# Patient Record
Sex: Female | Born: 1940
Health system: Southern US, Community
[De-identification: ages and names within clinical notes are randomized; demographics above are authoritative.]

## PROBLEM LIST (undated history)

## (undated) DIAGNOSIS — I350 Nonrheumatic aortic (valve) stenosis: Secondary | ICD-10-CM

## (undated) DIAGNOSIS — I209 Angina pectoris, unspecified: Secondary | ICD-10-CM

## (undated) DIAGNOSIS — I499 Cardiac arrhythmia, unspecified: Secondary | ICD-10-CM

## (undated) DIAGNOSIS — N8110 Cystocele, unspecified: Secondary | ICD-10-CM

## (undated) DIAGNOSIS — Z5189 Encounter for other specified aftercare: Secondary | ICD-10-CM

## (undated) DIAGNOSIS — E079 Disorder of thyroid, unspecified: Secondary | ICD-10-CM

## (undated) DIAGNOSIS — R0600 Dyspnea, unspecified: Secondary | ICD-10-CM

## (undated) DIAGNOSIS — M1990 Unspecified osteoarthritis, unspecified site: Secondary | ICD-10-CM

## (undated) DIAGNOSIS — I519 Heart disease, unspecified: Secondary | ICD-10-CM

## (undated) DIAGNOSIS — I10 Essential (primary) hypertension: Secondary | ICD-10-CM

## (undated) DIAGNOSIS — E785 Hyperlipidemia, unspecified: Secondary | ICD-10-CM

## (undated) DIAGNOSIS — H409 Unspecified glaucoma: Secondary | ICD-10-CM

## (undated) DIAGNOSIS — R011 Cardiac murmur, unspecified: Secondary | ICD-10-CM

## (undated) DIAGNOSIS — K219 Gastro-esophageal reflux disease without esophagitis: Secondary | ICD-10-CM

## (undated) HISTORY — DX: Hyperlipidemia, unspecified: E78.5

## (undated) HISTORY — DX: Nonrheumatic aortic (valve) stenosis: I35.0

## (undated) HISTORY — DX: Heart disease, unspecified: I51.9

## (undated) HISTORY — DX: Unspecified osteoarthritis, unspecified site: M19.90

## (undated) HISTORY — PX: COLONOSCOPY W/ POLYPECTOMY: SHX1380

## (undated) HISTORY — DX: Disorder of thyroid, unspecified: E07.9

## (undated) HISTORY — PX: CATARACT EXTRACTION: SUR2

## (undated) HISTORY — PX: DILATION AND CURETTAGE OF UTERUS: SHX78

## (undated) HISTORY — PX: ABDOMINAL HYSTERECTOMY: SHX81

## (undated) HISTORY — PX: COLONOSCOPY: SHX174

## (undated) HISTORY — PX: OVARY SURGERY: SHX727

## (undated) HISTORY — DX: Essential (primary) hypertension: I10

## (undated) HISTORY — DX: Gastro-esophageal reflux disease without esophagitis: K21.9

## (undated) HISTORY — DX: Unspecified glaucoma: H40.9

## (undated) SURGERY — ESOPHAGOGASTRODUODENOSCOPY (EGD) WITH PROPOFOL
Anesthesia: Monitor Anesthesia Care

---

## 1995-05-17 HISTORY — PX: LYMPH NODE DISSECTION: SHX5087

## 2007-05-17 HISTORY — PX: CHOLECYSTECTOMY: SHX55

## 2008-02-14 HISTORY — PX: THYROIDECTOMY: SHX17

## 2008-05-16 HISTORY — PX: NM MYOCAR PERF WALL MOTION: HXRAD629

## 2008-06-26 ENCOUNTER — Encounter: Payer: Self-pay | Admitting: Family Medicine

## 2008-08-06 ENCOUNTER — Ambulatory Visit: Payer: Self-pay | Admitting: Family Medicine

## 2008-08-06 DIAGNOSIS — Z794 Long term (current) use of insulin: Secondary | ICD-10-CM | POA: Insufficient documentation

## 2008-08-06 DIAGNOSIS — E785 Hyperlipidemia, unspecified: Secondary | ICD-10-CM | POA: Insufficient documentation

## 2008-08-06 DIAGNOSIS — E1169 Type 2 diabetes mellitus with other specified complication: Secondary | ICD-10-CM | POA: Insufficient documentation

## 2008-08-06 DIAGNOSIS — E663 Overweight: Secondary | ICD-10-CM | POA: Insufficient documentation

## 2008-08-06 DIAGNOSIS — N181 Chronic kidney disease, stage 1: Secondary | ICD-10-CM | POA: Insufficient documentation

## 2008-08-06 DIAGNOSIS — I10 Essential (primary) hypertension: Secondary | ICD-10-CM | POA: Insufficient documentation

## 2008-08-06 DIAGNOSIS — E782 Mixed hyperlipidemia: Secondary | ICD-10-CM | POA: Insufficient documentation

## 2008-08-06 DIAGNOSIS — E1122 Type 2 diabetes mellitus with diabetic chronic kidney disease: Secondary | ICD-10-CM | POA: Insufficient documentation

## 2008-08-06 LAB — CONVERTED CEMR LAB
Glucose, Bld: 103 mg/dL
Hgb A1c MFr Bld: 7.8 %

## 2008-08-14 ENCOUNTER — Encounter: Payer: Self-pay | Admitting: Family Medicine

## 2008-08-17 ENCOUNTER — Encounter: Payer: Self-pay | Admitting: Family Medicine

## 2008-08-18 LAB — CONVERTED CEMR LAB
ALT: 38 units/L — ABNORMAL HIGH (ref 0–35)
AST: 36 units/L (ref 0–37)
Albumin: 4.2 g/dL (ref 3.5–5.2)
Alkaline Phosphatase: 58 units/L (ref 39–117)
BUN: 11 mg/dL (ref 6–23)
Basophils Absolute: 0 10*3/uL (ref 0.0–0.1)
Basophils Relative: 1 % (ref 0–1)
Bilirubin, Direct: 0.1 mg/dL (ref 0.0–0.3)
CO2: 27 meq/L (ref 19–32)
Calcium: 9.6 mg/dL (ref 8.4–10.5)
Chloride: 102 meq/L (ref 96–112)
Cholesterol: 197 mg/dL (ref 0–200)
Creatinine, Ser: 0.78 mg/dL (ref 0.40–1.20)
Eosinophils Absolute: 0.2 10*3/uL (ref 0.0–0.7)
Eosinophils Relative: 3 % (ref 0–5)
Glucose, Bld: 133 mg/dL — ABNORMAL HIGH (ref 70–99)
HCT: 40.3 % (ref 36.0–46.0)
HDL: 41 mg/dL (ref >39)
Hemoglobin: 13.5 g/dL (ref 12.0–15.0)
Indirect Bilirubin: 0.4 mg/dL (ref 0.0–0.9)
LDL Cholesterol: 132 mg/dL — ABNORMAL HIGH (ref 0–99)
Lymphocytes Relative: 40 % (ref 12–46)
Lymphs Abs: 2.2 10*3/uL (ref 0.7–4.0)
MCHC: 33.5 g/dL (ref 30.0–36.0)
MCV: 82.8 fL (ref 78.0–100.0)
Monocytes Absolute: 0.4 10*3/uL (ref 0.1–1.0)
Monocytes Relative: 7 % (ref 3–12)
Neutro Abs: 2.7 10*3/uL (ref 1.7–7.7)
Neutrophils Relative %: 49 % (ref 43–77)
Platelets: 213 10*3/uL (ref 150–400)
Potassium: 3.9 meq/L (ref 3.5–5.3)
RBC: 4.87 M/uL (ref 3.87–5.11)
RDW: 13.9 % (ref 11.5–15.5)
Sodium: 142 meq/L (ref 135–145)
TSH: 2.368 microintl units/mL (ref 0.350–4.500)
Total Bilirubin: 0.5 mg/dL (ref 0.3–1.2)
Total CHOL/HDL Ratio: 4.8
Total Protein: 7.7 g/dL (ref 6.0–8.3)
Triglycerides: 120 mg/dL (ref <150)
VLDL: 24 mg/dL (ref 0–40)
WBC: 5.5 10*3/uL (ref 4.0–10.5)

## 2008-08-28 ENCOUNTER — Encounter: Payer: Self-pay | Admitting: Family Medicine

## 2008-09-03 ENCOUNTER — Encounter: Payer: Self-pay | Admitting: Family Medicine

## 2008-09-05 ENCOUNTER — Ambulatory Visit (HOSPITAL_COMMUNITY): Admission: RE | Admit: 2008-09-05 | Discharge: 2008-09-05 | Payer: Self-pay | Admitting: Cardiology

## 2008-09-05 ENCOUNTER — Encounter: Payer: Self-pay | Admitting: Family Medicine

## 2008-09-10 ENCOUNTER — Ambulatory Visit (HOSPITAL_COMMUNITY): Admission: RE | Admit: 2008-09-10 | Discharge: 2008-09-10 | Payer: Self-pay | Admitting: Cardiology

## 2008-09-10 HISTORY — PX: LEFT HEART CATH: SHX5946

## 2008-09-17 ENCOUNTER — Ambulatory Visit: Payer: Self-pay | Admitting: Family Medicine

## 2008-09-17 DIAGNOSIS — R109 Unspecified abdominal pain: Secondary | ICD-10-CM | POA: Insufficient documentation

## 2008-09-17 LAB — CONVERTED CEMR LAB: Glucose, Bld: 113 mg/dL

## 2008-09-22 ENCOUNTER — Encounter (INDEPENDENT_AMBULATORY_CARE_PROVIDER_SITE_OTHER): Payer: Self-pay | Admitting: *Deleted

## 2008-09-22 LAB — CONVERTED CEMR LAB: Helicobacter Pylori Antibody-IgG: 0.4

## 2008-09-26 ENCOUNTER — Ambulatory Visit (HOSPITAL_COMMUNITY): Admission: RE | Admit: 2008-09-26 | Discharge: 2008-09-26 | Payer: Self-pay | Admitting: Family Medicine

## 2008-09-29 ENCOUNTER — Encounter: Payer: Self-pay | Admitting: Family Medicine

## 2008-10-01 ENCOUNTER — Encounter (HOSPITAL_COMMUNITY): Admission: RE | Admit: 2008-10-01 | Discharge: 2008-10-31 | Payer: Self-pay | Admitting: Family Medicine

## 2008-10-22 ENCOUNTER — Ambulatory Visit: Payer: Self-pay | Admitting: Family Medicine

## 2008-10-22 ENCOUNTER — Encounter: Payer: Self-pay | Admitting: Family Medicine

## 2008-10-22 ENCOUNTER — Other Ambulatory Visit: Admission: RE | Admit: 2008-10-22 | Discharge: 2008-10-22 | Payer: Self-pay | Admitting: Family Medicine

## 2008-10-22 LAB — CONVERTED CEMR LAB
Creatinine, Urine: 83.9 mg/dL
Glucose, Bld: 123 mg/dL
Microalb Creat Ratio: 15.4 mg/g (ref 0.0–30.0)
Microalb, Ur: 1.29 mg/dL (ref 0.00–1.89)
OCCULT 1: NEGATIVE

## 2008-11-06 ENCOUNTER — Encounter: Payer: Self-pay | Admitting: Family Medicine

## 2008-11-13 ENCOUNTER — Ambulatory Visit: Payer: Self-pay | Admitting: Gastroenterology

## 2008-11-13 DIAGNOSIS — R198 Other specified symptoms and signs involving the digestive system and abdomen: Secondary | ICD-10-CM | POA: Insufficient documentation

## 2008-11-18 DIAGNOSIS — K219 Gastro-esophageal reflux disease without esophagitis: Secondary | ICD-10-CM

## 2008-11-18 HISTORY — DX: Gastro-esophageal reflux disease without esophagitis: K21.9

## 2008-12-03 ENCOUNTER — Ambulatory Visit: Payer: Self-pay | Admitting: Family Medicine

## 2008-12-03 LAB — CONVERTED CEMR LAB
Glucose, Bld: 165 mg/dL
Hgb A1c MFr Bld: 6.8 %

## 2008-12-04 ENCOUNTER — Encounter: Payer: Self-pay | Admitting: Family Medicine

## 2008-12-05 ENCOUNTER — Ambulatory Visit: Payer: Self-pay | Admitting: Gastroenterology

## 2008-12-05 ENCOUNTER — Ambulatory Visit (HOSPITAL_COMMUNITY): Admission: RE | Admit: 2008-12-05 | Discharge: 2008-12-05 | Payer: Self-pay | Admitting: Gastroenterology

## 2008-12-05 ENCOUNTER — Encounter: Payer: Self-pay | Admitting: Gastroenterology

## 2008-12-25 ENCOUNTER — Telehealth: Payer: Self-pay | Admitting: Family Medicine

## 2009-01-12 ENCOUNTER — Telehealth: Payer: Self-pay | Admitting: Family Medicine

## 2009-01-29 ENCOUNTER — Encounter: Payer: Self-pay | Admitting: Family Medicine

## 2009-02-16 LAB — CONVERTED CEMR LAB
ALT: 32 units/L (ref 0–35)
AST: 31 units/L (ref 0–37)
Albumin: 4.2 g/dL (ref 3.5–5.2)
Alkaline Phosphatase: 54 units/L (ref 39–117)
BUN: 10 mg/dL (ref 6–23)
Bilirubin, Direct: 0.1 mg/dL (ref 0.0–0.3)
CO2: 27 meq/L (ref 19–32)
Calcium: 9.5 mg/dL (ref 8.4–10.5)
Chloride: 103 meq/L (ref 96–112)
Cholesterol: 147 mg/dL (ref 0–200)
Creatinine, Ser: 0.68 mg/dL (ref 0.40–1.20)
Glucose, Bld: 131 mg/dL — ABNORMAL HIGH (ref 70–99)
HDL: 37 mg/dL — ABNORMAL LOW (ref >39)
Indirect Bilirubin: 0.3 mg/dL (ref 0.0–0.9)
LDL Cholesterol: 77 mg/dL (ref 0–99)
Potassium: 3.6 meq/L (ref 3.5–5.3)
Sodium: 144 meq/L (ref 135–145)
Total Bilirubin: 0.4 mg/dL (ref 0.3–1.2)
Total CHOL/HDL Ratio: 4
Total Protein: 7.3 g/dL (ref 6.0–8.3)
Triglycerides: 163 mg/dL — ABNORMAL HIGH (ref <150)
VLDL: 33 mg/dL (ref 0–40)

## 2009-02-27 ENCOUNTER — Encounter: Payer: Self-pay | Admitting: Family Medicine

## 2009-03-05 ENCOUNTER — Telehealth: Payer: Self-pay | Admitting: Family Medicine

## 2009-03-05 ENCOUNTER — Ambulatory Visit: Payer: Self-pay | Admitting: Family Medicine

## 2009-03-05 LAB — CONVERTED CEMR LAB: Hgb A1c MFr Bld: 7 %

## 2009-03-19 ENCOUNTER — Telehealth: Payer: Self-pay | Admitting: Family Medicine

## 2009-04-08 ENCOUNTER — Ambulatory Visit: Payer: Self-pay | Admitting: Family Medicine

## 2009-04-22 ENCOUNTER — Ambulatory Visit: Payer: Self-pay | Admitting: Family Medicine

## 2009-04-22 DIAGNOSIS — M542 Cervicalgia: Secondary | ICD-10-CM | POA: Insufficient documentation

## 2009-04-22 LAB — CONVERTED CEMR LAB: Glucose, Bld: 95 mg/dL

## 2009-05-06 ENCOUNTER — Ambulatory Visit: Payer: Self-pay | Admitting: Family Medicine

## 2009-05-28 ENCOUNTER — Ambulatory Visit: Payer: Self-pay | Admitting: Family Medicine

## 2009-05-28 LAB — CONVERTED CEMR LAB: Glucose, Bld: 140 mg/dL

## 2009-05-31 DIAGNOSIS — K828 Other specified diseases of gallbladder: Secondary | ICD-10-CM | POA: Insufficient documentation

## 2009-06-03 ENCOUNTER — Telehealth: Payer: Self-pay | Admitting: Family Medicine

## 2009-06-09 ENCOUNTER — Ambulatory Visit (HOSPITAL_COMMUNITY): Admission: RE | Admit: 2009-06-09 | Discharge: 2009-06-09 | Payer: Self-pay | Admitting: General Surgery

## 2009-07-13 ENCOUNTER — Ambulatory Visit (HOSPITAL_COMMUNITY): Admission: RE | Admit: 2009-07-13 | Discharge: 2009-07-13 | Payer: Self-pay | Admitting: Pediatrics

## 2009-08-06 ENCOUNTER — Ambulatory Visit: Payer: Self-pay | Admitting: Family Medicine

## 2009-08-06 DIAGNOSIS — R634 Abnormal weight loss: Secondary | ICD-10-CM | POA: Insufficient documentation

## 2009-08-06 DIAGNOSIS — R5383 Other fatigue: Secondary | ICD-10-CM

## 2009-08-06 DIAGNOSIS — R5381 Other malaise: Secondary | ICD-10-CM | POA: Insufficient documentation

## 2009-08-21 ENCOUNTER — Ambulatory Visit: Payer: Self-pay | Admitting: Gastroenterology

## 2009-08-24 DIAGNOSIS — K589 Irritable bowel syndrome without diarrhea: Secondary | ICD-10-CM | POA: Insufficient documentation

## 2009-09-16 LAB — CONVERTED CEMR LAB
Basophils Absolute: 0 10*3/uL (ref 0.0–0.1)
Basophils Relative: 0 % (ref 0–1)
Eosinophils Absolute: 0.2 10*3/uL (ref 0.0–0.7)
Eosinophils Relative: 4 % (ref 0–5)
HCT: 38.7 % (ref 36.0–46.0)
Hemoglobin: 12.9 g/dL (ref 12.0–15.0)
Hgb A1c MFr Bld: 6.5 % — ABNORMAL HIGH (ref <5.7)
Hgb A1c MFr Bld: 6.8 % — ABNORMAL HIGH (ref 4.6–6.1)
Lymphocytes Relative: 40 % (ref 12–46)
Lymphs Abs: 2.5 10*3/uL (ref 0.7–4.0)
MCHC: 33.3 g/dL (ref 30.0–36.0)
MCV: 84.3 fL (ref 78.0–100.0)
Monocytes Absolute: 0.4 10*3/uL (ref 0.1–1.0)
Monocytes Relative: 6 % (ref 3–12)
Neutro Abs: 3.1 10*3/uL (ref 1.7–7.7)
Neutrophils Relative %: 50 % (ref 43–77)
Platelets: 196 10*3/uL (ref 150–400)
RBC: 4.59 M/uL (ref 3.87–5.11)
RDW: 14.1 % (ref 11.5–15.5)
WBC: 6.2 10*3/uL (ref 4.0–10.5)

## 2009-10-13 ENCOUNTER — Telehealth: Payer: Self-pay | Admitting: Physician Assistant

## 2009-10-20 ENCOUNTER — Encounter: Payer: Self-pay | Admitting: Family Medicine

## 2009-11-27 ENCOUNTER — Encounter (INDEPENDENT_AMBULATORY_CARE_PROVIDER_SITE_OTHER): Payer: Self-pay

## 2009-12-08 ENCOUNTER — Telehealth: Payer: Self-pay | Admitting: Family Medicine

## 2009-12-23 ENCOUNTER — Emergency Department (HOSPITAL_COMMUNITY)
Admission: EM | Admit: 2009-12-23 | Discharge: 2009-12-23 | Payer: Self-pay | Source: Home / Self Care | Admitting: Emergency Medicine

## 2009-12-29 ENCOUNTER — Ambulatory Visit (HOSPITAL_COMMUNITY): Admission: RE | Admit: 2009-12-29 | Discharge: 2009-12-29 | Payer: Self-pay | Admitting: Orthopaedic Surgery

## 2010-01-07 ENCOUNTER — Telehealth: Payer: Self-pay | Admitting: Family Medicine

## 2010-01-13 ENCOUNTER — Ambulatory Visit: Payer: Self-pay | Admitting: Family Medicine

## 2010-01-13 DIAGNOSIS — M899 Disorder of bone, unspecified: Secondary | ICD-10-CM | POA: Insufficient documentation

## 2010-01-13 DIAGNOSIS — M949 Disorder of cartilage, unspecified: Secondary | ICD-10-CM

## 2010-01-14 ENCOUNTER — Encounter: Payer: Self-pay | Admitting: Family Medicine

## 2010-01-14 LAB — CONVERTED CEMR LAB
Creatinine, Urine: 43.6 mg/dL
Microalb Creat Ratio: 33 mg/g — ABNORMAL HIGH (ref 0.0–30.0)
Microalb, Ur: 1.44 mg/dL (ref 0.00–1.89)

## 2010-01-20 ENCOUNTER — Encounter: Payer: Self-pay | Admitting: Family Medicine

## 2010-01-22 ENCOUNTER — Encounter: Payer: Self-pay | Admitting: Family Medicine

## 2010-01-22 LAB — CONVERTED CEMR LAB
ALT: 17 units/L (ref 0–53)
AST: 18 units/L (ref 0–37)
Albumin: 3.8 g/dL (ref 3.5–5.2)
Alkaline Phosphatase: 44 units/L (ref 39–117)
BUN: 12 mg/dL (ref 6–23)
Basophils Absolute: 0 10*3/uL (ref 0.0–0.1)
Basophils Relative: 0 % (ref 0–1)
Bilirubin, Direct: 0.1 mg/dL (ref 0.0–0.3)
CO2: 30 meq/L (ref 19–32)
Calcium: 9.3 mg/dL (ref 8.4–10.5)
Chloride: 102 meq/L (ref 96–112)
Cholesterol: 153 mg/dL (ref 0–200)
Creatinine, Ser: 0.77 mg/dL (ref 0.40–1.50)
Eosinophils Absolute: 0.2 10*3/uL (ref 0.0–0.7)
Eosinophils Relative: 3 % (ref 0–5)
Glucose, Bld: 98 mg/dL (ref 70–99)
HCT: 39.1 % (ref 39.0–52.0)
HDL: 42 mg/dL (ref >39)
Hemoglobin: 12.8 g/dL — ABNORMAL LOW (ref 13.0–17.0)
Hgb A1c MFr Bld: 6.4 % — ABNORMAL HIGH (ref <5.7)
Indirect Bilirubin: 0.3 mg/dL (ref 0.0–0.9)
LDL Cholesterol: 87 mg/dL (ref 0–99)
Lymphocytes Relative: 35 % (ref 12–46)
Lymphs Abs: 2.4 10*3/uL (ref 0.7–4.0)
MCHC: 32.7 g/dL (ref 30.0–36.0)
MCV: 84.6 fL (ref 78.0–100.0)
Monocytes Absolute: 0.3 10*3/uL (ref 0.1–1.0)
Monocytes Relative: 5 % (ref 3–12)
Neutro Abs: 3.9 10*3/uL (ref 1.7–7.7)
Neutrophils Relative %: 57 % (ref 43–77)
Platelets: 176 10*3/uL (ref 150–400)
Potassium: 3.8 meq/L (ref 3.5–5.3)
RBC: 4.62 M/uL (ref 4.22–5.81)
RDW: 14.5 % (ref 11.5–15.5)
Sodium: 144 meq/L (ref 135–145)
TSH: 3.381 microintl units/mL (ref 0.350–4.500)
Total Bilirubin: 0.4 mg/dL (ref 0.3–1.2)
Total CHOL/HDL Ratio: 3.6
Total Protein: 6.3 g/dL (ref 6.0–8.3)
Triglycerides: 122 mg/dL (ref <150)
VLDL: 24 mg/dL (ref 0–40)
Vit D, 25-Hydroxy: 36 ng/mL (ref 30–89)
WBC: 6.9 10*3/uL (ref 4.0–10.5)

## 2010-03-02 ENCOUNTER — Ambulatory Visit: Payer: Self-pay | Admitting: Family Medicine

## 2010-05-31 ENCOUNTER — Encounter: Payer: Self-pay | Admitting: Family Medicine

## 2010-05-31 LAB — CONVERTED CEMR LAB
BUN: 11 mg/dL (ref 6–23)
CO2: 28 meq/L (ref 19–32)
Calcium: 9.4 mg/dL (ref 8.4–10.5)
Chloride: 101 meq/L (ref 96–112)
Creatinine, Ser: 0.76 mg/dL (ref 0.40–1.20)
Glucose, Bld: 114 mg/dL — ABNORMAL HIGH (ref 70–99)
Hgb A1c MFr Bld: 7 % — ABNORMAL HIGH (ref <5.7)
Potassium: 3.9 meq/L (ref 3.5–5.3)
Sodium: 140 meq/L (ref 135–145)

## 2010-06-02 ENCOUNTER — Ambulatory Visit
Admission: RE | Admit: 2010-06-02 | Discharge: 2010-06-02 | Payer: Self-pay | Source: Home / Self Care | Attending: Family Medicine | Admitting: Family Medicine

## 2010-06-02 ENCOUNTER — Encounter: Payer: Self-pay | Admitting: Family Medicine

## 2010-06-15 NOTE — Assessment & Plan Note (Signed)
Summary: IBS-C, ?WEIGHT LOSS   Visit Type:  Follow-up Visit Primary Care Provider:  Lodema Hong, M.D.  Chief Complaint:  weight loss.  History of Present Illness: Gained up to 192 lbs and then all of the sudden she lost weight. Doesn't know how she gained or lost weight. Was going through alot and sister was sick and in the hospital. She passed in FEB 2011. Appetite: better. Energy level: feels weak off and on. Dealing with the death of her sister still. This week has been a good week. Sister had multiple myeloma. Had nausea: every day for a couple weeks in FEB 2011. No vomiting. BMs: with Miralax two times a day will move every day.  Takes Miralax. 4 12 oz water per day. Doesn't eat a lot of fiber because she doesn't have an appetite for the right kind.   Feels better since seeing Dr. Lodema Hong.  Current Medications (verified): 1)  Omeprazole 20 Mg Cpdr (Omeprazole) .... Take 1 Tablet By Mouth 30 Minutes Prior To Breakfast 2)  Benazepril Hcl 40 Mg Tabs (Benazepril Hcl) .... One Tab By Mouth Qd 3)  Simvastatin 40 Mg Tabs (Simvastatin) .... One Tab By Mouth Qhs 4)  Dulcolax 5 Mg Tbec (Bisacodyl) .Marland Kitchen.. 1 Tab By Mouth Two Times A Day As Needed 5)  Tribenzor 40-10-25 Mg Tabs (Olmesartan-Amlodipine-Hctz) .... Take 1 Tablet By Mouth Once A Day 6)  Metoprolol Tartrate 50 Mg Tabs (Metoprolol Tartrate) .... Take 1 Tablet By Mouth Two Times A Day 7)  Metformin Hcl 1000 Mg Tabs (Metformin Hcl) .... Take 1 Tablet By Mouth Two Times A Day 8)  Lantus Solostar 100 Unit/ml Soln (Insulin Glargine) .... 40 Units Daily 9)  Clonidine Hcl 0.1 Mg Tabs (Clonidine Hcl) .... Take 1 Tab By Mouth At Bedtime  Allergies (verified): 1)  * Onglyza  Past History:  Past Medical History: Hypertension x 20 years Diabetes x 25 years Hyperlipidemia x 10 years obesity  lifelong TCS 6 years ago in [place], MD-sml polyp  Review of Systems       JULY 2010: 182 LBS  Vital Signs:  Patient profile:   70 year old  female Menstrual status:  hysterectomy Height:      65 inches Weight:      179 pounds BMI:     29.89 Temp:     98.2 degrees F oral Pulse rate:   60 / minute BP sitting:   132 / 88  (left arm) Cuff size:   regular  Vitals Entered By: Hendricks Limes LPN (August 21, 1608 1:57 PM)  Physical Exam  General:  Well developed, well nourished, no acute distress. Head:  Normocephalic and atraumatic. Eyes:  PERRLA, no icterus. Mouth:  No deformity or lesions. Neck:  Supple; no masses. Lungs:  Clear throughout to auscultation. Heart:  Regular rate and rhythm; no murmurs. Abdomen:  Soft, mild TTP in periumbilical region, nondistended. Normal bowel sounds. Extremities:  No edema or deformities noted. Neurologic:  Alert and  oriented x4;  grossly normal neurologically. Psych:  Alert and cooperative. Slightly depressed mood and affect.  Impression & Recommendations:  Problem # 1:  CONSTIPATION (ICD-564.00) Likley 2o to IBS-c and contributing to abd pain. Add Benfiber 2 tsp daily for 7 days then two times a day. Hold Miralax if gets loose stool. Add Digestrive Advantage IBS daily. Return visit in 3 mos. Pt's weight fluctuation most like 2o to situational stress/depression.  CC: PCP  Patient Instructions: 1)  Add Benfiber 2 tsp daily for 7  days then two times a day. 2)  Hold Miralax if gets loose stool. 3)  Add Digestrive Advantage IBS daily. 4)  Return visit in 3 mos.  5)  The medication list was reviewed and reconciled.  All changed / newly prescribed medications were explained.  A complete medication list was provided to the patient / caregiver.  Appended Document: Orders Update    Clinical Lists Changes  Problems: Added new problem of IRRITABLE BOWEL SYNDROME (ICD-564.1) Orders: Added new Service order of Est. Patient Level IV (84132) - Signed

## 2010-06-15 NOTE — Progress Notes (Signed)
Summary: referral  Phone Note Call from Patient   Summary of Call: pt wants to go ahead and have gallbladder removed. Can I refer her to dr. Lovell Sheehan [phone] Initial call taken by: Rudene Anda,  June 03, 2009 8:30 AM  Follow-up for Phone Call        yes pls and send rept of hida Follow-up by: Syliva Overman MD,  June 03, 2009 10:42 AM  Additional Follow-up for Phone Call Additional follow up Details #1::        pt has appt at dr. Lovell Sheehan office for 06/11/2009 9:30. pt notified and aware Additional Follow-up by: Rudene Anda,  June 03, 2009 11:23 AM

## 2010-06-15 NOTE — Letter (Signed)
Summary: Generic Letter, Intro to Referring  [facility]  [address]   [place], [place] [address]   Phone: [phone]  Fax: [phone]      Sep 22, 2008             RE: Joanna Brown   1941/01/04                 [address]                 [place], [place]  [address]  Dear Appt Secretary,     This pt has been scheduled an appt for 11/13/2008 @ 3:00 w/ Dr. Cira Servant.   Pt is aware of appt.        Sincerely,    Elinor Parkinson  [facility] Ph: [phone]   Fax: [phone]

## 2010-06-15 NOTE — Assessment & Plan Note (Signed)
Summary: FOLLOW UP   Vital Signs:  Patient profile:   70 year old female Menstrual status:  hysterectomy Height:      65 inches Weight:      185.75 pounds BMI:     31.02 Pulse rate:   69 / minute Pulse rhythm:   regular Resp:     16 per minute BP sitting:   160 / 90  (left arm)  Vitals Entered By: Worthy Keeler LPN (May 28, 2009 10:49 AM)  Nutrition Counseling: Patient's BMI is greater than 25 and therefore counseled on weight management options. CC: follow-up visit Is Patient Diabetic? No Pain Assessment Patient in pain? no        Primary Care Provider:  Lodema Hong, M.D.  CC:  follow-up visit.  History of Present Illness: Walks for 45 mins 5 days  per week. states her blood sugars continue to fluctuatte, she had increased her lantus to the max dose stated on the vial, 80 units up from 40 , without titration, felt weak, and since then she has stayed at 40. Admits that she clearly needs and would benefit from diabetic education. She is toilerating her BP meds with no adverse S/E of lightheadedness, leg swelling cough or leg cramps.   Current Medications (verified): 1)  Omeprazole 20 Mg Cpdr (Omeprazole) .... Take 1 Tablet By Mouth 30 Minutes Prior To Breakfast 2)  Benazepril Hcl 40 Mg Tabs (Benazepril Hcl) .... One Tab By Mouth Qd 3)  Simvastatin 40 Mg Tabs (Simvastatin) .... One Tab By Mouth Qhs 4)  Dulcolax 5 Mg Tbec (Bisacodyl) .Marland Kitchen.. 1 Tab By Mouth Bid 5)  Tribenzor 40-10-25 Mg Tabs (Olmesartan-Amlodipine-Hctz) .... Take 1 Tablet By Mouth Once A Day 6)  Metoprolol Tartrate 50 Mg Tabs (Metoprolol Tartrate) .... Take 1 Tablet By Mouth Two Times A Day 7)  Metformin Hcl 1000 Mg Tabs (Metformin Hcl) .... Take 1 Tablet By Mouth Two Times A Day 8)  Lantus Solostar 100 Unit/ml Soln (Insulin Glargine) .... 80 Units Daily  Allergies (verified): 1)  * Onglyza  Review of Systems      See HPI General:  Denies chills, fatigue, fever, and loss of appetite. Eyes:  Denies  blurring and discharge. ENT:  Denies hoarseness, nasal congestion, sinus pressure, and sore throat. CV:  Denies chest pain or discomfort, difficulty breathing while lying down, palpitations, shortness of breath with exertion, and swelling of feet. Resp:  Denies cough and sputum productive. GI:  Complains of abdominal pain, indigestion, and nausea; denies constipation, diarrhea, and vomiting; pt's EFv is <0.5%. GU:  Denies dysuria and urinary frequency. MS:  Denies joint pain and muscle weakness. Neuro:  Denies falling down, headaches, tingling, and tremors. Psych:  Denies anxiety and easily angered. Endo:  fasting sugars are in the 170's , and at different times ofte day 158, never less than 100, couple of times it was 232. Heme:  Denies abnormal bruising and bleeding. Allergy:  Complains of seasonal allergies.  Physical Exam  General:  Well-developed,obese,in no acute distress; alert,appropriate and cooperative throughout examination HEENT: No facial asymmetry,  EOMI, No sinus tenderness, TM's Clear, oropharynx  pink and moist.   Chest: Clear to auscultation bilaterally.  CVS: S1, S2, No murmurs, No S3.   Abd: Soft, Nontender.  MS: Adequate ROM spine, hips, shoulders and knees.  Ext: No edema.   CNS: CN 2-12 intact, power tone and sensation normal throughout.   Skin: Intact, no visible lesions or rashes.  Psych: Good eye contact, normal affect.  Memory  intact, not anxious or depressed appearing.    Impression & Recommendations:  Problem # 1:  BILIARY DYSKINESIA (ICD-575.8) Assessment Deteriorated pt contemplating cholecystectomy  Problem # 2:  DIABETES MELLITUS, TYPE II (ICD-250.00) Assessment: Deteriorated  Her updated medication list for this problem includes:    Benazepril Hcl 40 Mg Tabs (Benazepril hcl) ..... One tab by mouth qd    Tribenzor 40-10-25 Mg Tabs (Olmesartan-amlodipine-hctz) .Marland Kitchen... Take 1 tablet by mouth once a day    Metformin Hcl 1000 Mg Tabs (Metformin hcl)  .Marland Kitchen... Take 1 tablet by mouth two times a day    Lantus Solostar 100 Unit/ml Soln (Insulin glargine) .Marland KitchenMarland KitchenMarland KitchenMarland Kitchen 80 units daily  Orders: Glucose, (CBG) (82962) T- Hemoglobin A1C [phone])  Labs Reviewed: Creat: 0.68 (02/16/2009)    Reviewed HgBA1c results: 7.0 (03/05/2009)  6.8 (12/03/2008)  Problem # 3:  HYPERTENSION (ICD-401.9) Assessment: Deteriorated  Her updated medication list for this problem includes:    Benazepril Hcl 40 Mg Tabs (Benazepril hcl) ..... One tab by mouth qd    Tribenzor 40-10-25 Mg Tabs (Olmesartan-amlodipine-hctz) .Marland Kitchen... Take 1 tablet by mouth once a day    Metoprolol Tartrate 50 Mg Tabs (Metoprolol tartrate) .Marland Kitchen... Take 1 tablet by mouth two times a day    Clonidine Hcl 0.1 Mg Tabs (Clonidine hcl) .Marland Kitchen... Take 1 tab by mouth at bedtime  BP today: 160/90 Prior BP: 140/82 (05/06/2009)  Labs Reviewed: K+: 3.6 (02/16/2009) Creat: : 0.68 (02/16/2009)   Chol: 147 (02/16/2009)   HDL: 37 (02/16/2009)   LDL: 77 (02/16/2009)   TG: 163 (02/16/2009)  Problem # 4:  OBESITY, UNSPECIFIED (ICD-278.00) Assessment: Unchanged  Ht: 65 (05/28/2009)   Wt: 185.75 (05/28/2009)   BMI: 31.02 (05/28/2009)  Complete Medication List: 1)  Omeprazole 20 Mg Cpdr (Omeprazole) .... Take 1 tablet by mouth 30 minutes prior to breakfast 2)  Benazepril Hcl 40 Mg Tabs (Benazepril hcl) .... One tab by mouth qd 3)  Simvastatin 40 Mg Tabs (Simvastatin) .... One tab by mouth qhs 4)  Dulcolax 5 Mg Tbec (Bisacodyl) .Marland Kitchen.. 1 tab by mouth bid 5)  Tribenzor 40-10-25 Mg Tabs (Olmesartan-amlodipine-hctz) .... Take 1 tablet by mouth once a day 6)  Metoprolol Tartrate 50 Mg Tabs (Metoprolol tartrate) .... Take 1 tablet by mouth two times a day 7)  Metformin Hcl 1000 Mg Tabs (Metformin hcl) .... Take 1 tablet by mouth two times a day 8)  Lantus Solostar 100 Unit/ml Soln (Insulin glargine) .... 80 units daily 9)  Clonidine Hcl 0.1 Mg Tabs (Clonidine hcl) .... Take 1 tab by mouth at bedtime  Other  Orders: Radiology Referral (Radiology)  Patient Instructions: 1)  Please schedule a follow-up appointment in 2 months. 2)  It is important that you exercise regularly at least 60 minutes 5 times a week. If you develop chest pain, have severe difficulty breathing, or feel very tired , stop exercising immediately and seek medical attention. 3)  PLS attend a diabetic class in the next week. 4)  Start testing at least once daily 5)  HbgA1C prior to visit, ICD-9:  due 01/21 or after 6)  All the best for the New year 7)  Pls think about having your gallbladder removed, it does not work and will cause pain and bloating when you erat. 8)  New med   for blood pressure, continue the others, take the new one art 9pm  Prescriptions: CLONIDINE HCL 0.1 MG TABS (CLONIDINE HCL) Take 1 tab by mouth at bedtime  #90 x 0   Entered and  Authorized by:   Syliva Overman MD   Signed by:   Syliva Overman MD on 05/28/2009   Method used:   Electronically to        [facility] [phone][facility] (retail)       [address]       [place], [place]  [address]       Ph: [phone] or [phone]       Fax: [phone]   RxID:   [id number]   Laboratory Results   Blood Tests   Date/Time Received: May 28, 2009  Date/Time Reported: May 28, 2009   Glucose (random): 140 mg/dL   (Normal Range: 36-644)

## 2010-06-15 NOTE — Progress Notes (Signed)
Summary: refill  Phone Note Call from Patient   Summary of Call: pt needs to get a refill on tribenzor 40-10-25mg  at [facility]   [phone] Initial call taken by: Rudene Anda,  December 08, 2009 9:41 AM  Follow-up for Phone Call        Rx Called In Follow-up by: Adella Hare LPN,  December 08, 2009 10:49 AM    Prescriptions: Marya Landry 40-10-25 MG TABS [facility]) Take 1 tablet by mouth once a day  #30 Tablet x 0   Entered by:   Adella Hare LPN   Authorized by:   Syliva Overman MD   Signed by:   Adella Hare LPN on 54/27/0623   Method used:   Electronically to        [facility] [phone]* (retail)       [address]       [place], [place]  [address]       Ph: [phone] or [phone]       Fax: [phone]   RxID:   [id number]

## 2010-06-15 NOTE — Assessment & Plan Note (Signed)
Summary: office visit   Vital Signs:  Patient profile:   70 year old female Menstrual status:  hysterectomy Height:      65 inches Weight:      178.75 pounds BMI:     29.85 O2 Sat:      98 % Pulse rate:   63 / minute Pulse rhythm:   regular Resp:     16 per minute BP sitting:   150 / 82  (left arm) Cuff size:   regular  Vitals Entered By: Everitt Amber LPN (August 06, 2009 9:39 AM)  Nutrition Counseling: Patient's BMI is greater than 25 and therefore counseled on weight management options. CC: Follow up chronic problems Is Patient Diabetic? Yes   Primary Care Provider:  Lodema Hong, M.D.  CC:  Follow up chronic problems.  History of Present Illness: Reports  that she has been doing well. Denies recent fever or chills. Denies sinus pressure, nasal congestion , ear pain or sore throat. Denies chest congestion, or cough productive of sputum. Denies chest pain, palpitations, PND, orthopnea or leg swelling. Denies abdominal pain, nausea, vomitting, but does reports a change in the caliber of her stool with weight losss and poor apetitie in the past 4 to 6 months. she has a past h/o colon polyps, states her last colonscopy was approx 6 yrs ago, and is ncertain if we will be able to get [place]. I explained based on her symptoms she does need a rept colonscopy at this time. She recently lost a sisiter and is grieving over this Denies dysuria , frequency, incontinence or hesitancy. Denies  joint pain, swelling, or reduced mobility. Denies headaches, vertigo, seizures. Reports mild depression, denies  anxiety or insomnia. Denies  rash, lesions, or itch. She reports improvement in her blood sugars with fasting sugars seldom over 130.     Current Medications (verified): 1)  Omeprazole 20 Mg Cpdr (Omeprazole) .... Take 1 Tablet By Mouth 30 Minutes Prior To Breakfast 2)  Benazepril Hcl 40 Mg Tabs (Benazepril Hcl) .... One Tab By Mouth Qd 3)  Simvastatin 40 Mg Tabs  (Simvastatin) .... One Tab By Mouth Qhs 4)  Dulcolax 5 Mg Tbec (Bisacodyl) .Marland Kitchen.. 1 Tab By Mouth Bid 5)  Tribenzor 40-10-25 Mg Tabs (Olmesartan-Amlodipine-Hctz) .... Take 1 Tablet By Mouth Once A Day 6)  Metoprolol Tartrate 50 Mg Tabs (Metoprolol Tartrate) .... Take 1 Tablet By Mouth Two Times A Day 7)  Metformin Hcl 1000 Mg Tabs (Metformin Hcl) .... Take 1 Tablet By Mouth Two Times A Day 8)  Lantus Solostar 100 Unit/ml Soln (Insulin Glargine) .... 80 Units Daily 9)  Clonidine Hcl 0.1 Mg Tabs (Clonidine Hcl) .... Take 1 Tab By Mouth At Bedtime  Allergies (verified): 1)  * Onglyza  Past History:  Past medical, surgical, family and social histories (including risk factors) reviewed, and no changes noted (except as noted below).  Past Medical History: Reviewed history from 08/06/2008 and no changes required. Hypertension x 20 years Diabetes x 25 years Hyperlipidemia x 10 years obesity  lifelong  Past Surgical History: Reviewed history from 11/13/2008 and no changes required. Hysterectomy- 1994, for fibroids Thyroidectomy Nov 2009-goiter Vaginal prolapse Ovarian tumors removed-no cancer cataract surgery  on both eyes, 1998 and 2000  Family History: Reviewed history from 08/06/2008 and no changes required. Mother-deceased-stomach cancer, heart attack 60's Father-deceased-heart attack 50's 1 sister-living-multiple myleoma, dieed in 2011 at age 42  3 sisters-hypertension 1 sister deceased at 8  due to unknown cause,  multiorgan failure brother died at age 67 mVA  Social History: Reviewed history from 08/06/2008 and no changes required. Retired in 1999, was a Scientist, product/process development Married x 32 yrs Never Smoked Alcohol use-no Drug use-no Regular exercise-yes one son hTN  Review of Systems      See HPI General:  Complains of fatigue and weight loss. Eyes:  Denies blurring and discharge. GI:  Complains of change in bowel habits. Endo:  Denies cold intolerance, excessive hunger,  excessive thirst, excessive urination, heat intolerance, polyuria, and weight change; tests daily. Heme:  Denies abnormal bruising and bleeding. Allergy:  Denies hives or rash.  Physical Exam  General:  Well-developed,well-nourished,in no acute distress; alert,appropriate and cooperative throughout examination HEENT: No facial asymmetry,  EOMI, No sinus tenderness, TM's Clear, oropharynx  pink and moist.   Chest: Clear to auscultation bilaterally.  CVS: S1, S2, No murmurs, No S3.   Abd: Soft, Nontender.  MS: Adequate ROM spine, hips, shoulders and knees.  Ext: No edema.   CNS: CN 2-12 intact, power tone and sensation normal throughout.   Skin: Intact, no visible lesions or rashes.  Psych: Good eye contact, normal affect.  Memory intact, not anxious or depressed appearing.    Impression & Recommendations:  Problem # 1:  FATIGUE (ICD-780.79) Assessment Deteriorated  Orders: T-CBC w/Diff ([address])  Problem # 2:  WEIGHT LOSS (ICD-783.21) Assessment: Comment Only  Orders: Gastroenterology Referral (GI)  Problem # 3:  BILIARY DYSKINESIA (ICD-575.8) Assessment: Improved  Problem # 4:  OBESITY, UNSPECIFIED (ICD-278.00) Assessment: Improved  Ht: 65 (08/06/2009)   Wt: 178.75 (08/06/2009)   BMI: 29.85 (08/06/2009)  Problem # 5:  DIABETES MELLITUS, TYPE II (ICD-250.00) Assessment: Comment Only  Her updated medication list for this problem includes:    Benazepril Hcl 40 Mg Tabs (Benazepril hcl) ..... One tab by mouth qd    Tribenzor 40-10-25 Mg Tabs (Olmesartan-amlodipine-hctz) .Marland Kitchen... Take 1 tablet by mouth once a day    Metformin Hcl 1000 Mg Tabs (Metformin hcl) .Marland Kitchen... Take 1 tablet by mouth two times a day    Lantus Solostar 100 Unit/ml Soln (Insulin glargine) .Marland KitchenMarland KitchenMarland KitchenMarland Kitchen 80 units daily  Orders: T- Hemoglobin A1C ([id number])  Labs Reviewed: Creat: 0.68 (02/16/2009)    Reviewed HgBA1c results: 6.8 (06/11/2009)  7.0 (03/05/2009)  Problem # 6:  HYPERLIPIDEMIA  (ICD-272.4) Assessment: Comment Only  Her updated medication list for this problem includes:    Simvastatin 40 Mg Tabs (Simvastatin) ..... One tab by mouth qhs  Labs Reviewed: SGOT: 31 (02/16/2009)   SGPT: 32 (02/16/2009)   HDL:37 (02/16/2009), 41 (08/14/2008)  LDL:77 (02/16/2009), 132 (29/56/2130)  Chol:147 (02/16/2009), 197 (08/14/2008)  Trig:163 (02/16/2009), 120 (08/14/2008)  Complete Medication List: 1)  Omeprazole 20 Mg Cpdr (Omeprazole) .... Take 1 tablet by mouth 30 minutes prior to breakfast 2)  Benazepril Hcl 40 Mg Tabs (Benazepril hcl) .... One tab by mouth qd 3)  Simvastatin 40 Mg Tabs (Simvastatin) .... One tab by mouth qhs 4)  Dulcolax 5 Mg Tbec (Bisacodyl) .Marland Kitchen.. 1 tab by mouth bid 5)  Tribenzor 40-10-25 Mg Tabs (Olmesartan-amlodipine-hctz) .... Take 1 tablet by mouth once a day 6)  Metoprolol Tartrate 50 Mg Tabs (Metoprolol tartrate) .... Take 1 tablet by mouth two times a day 7)  Metformin Hcl 1000 Mg Tabs (Metformin hcl) .... Take 1 tablet by mouth two times a day 8)  Lantus Solostar 100 Unit/ml Soln (Insulin glargine) .... 80 units daily 9)  Clonidine Hcl 0.1 Mg Tabs (Clonidine hcl) .... Take 1 tab by mouth  at bedtime  Patient Instructions: 1)  f/u in early september. 2)  It is important that you exercise regularly at least 20 minutes 5 times a week. If you develop chest pain, have severe difficulty breathing, or feel very tired , stop exercising immediately and seek medical attention. 3)  CBC w/ Diff prior to visit, ICD-9:   end april after the 27th 4)  HbgA1C prior to visit, ICD-9 5)  pls start volunteerism  6)  you are being referre to [facility]I about the weight loss, change in bM and h/o colon polyp Prescriptions: CLONIDINE HCL 0.1 MG TABS (CLONIDINE HCL) Take 1 tab by mouth at bedtime  #90 x 3   Entered by:   Everitt Amber LPN   Authorized by:   Syliva Overman MD   Signed by:   Everitt Amber LPN on 91/47/8295   Method used:   Electronically to        [facility]  [phone]* (retail)       [address]       [place], [place]  [address]       Ph: [phone] or [phone]       Fax: [phone]   RxID:   [id number] BENAZEPRIL HCL 40 MG TABS (BENAZEPRIL HCL) one tab by mouth qd  #90 x 3   Entered by:   Everitt Amber LPN   Authorized by:   Syliva Overman MD   Signed by:   Everitt Amber LPN on 29/51/8841   Method used:   Electronically to        [facility] [phone]* (retail)       [address]       [place], [place]  [address]       Ph: [phone] or [phone]       Fax: [phone]   RxID:   [id number]

## 2010-06-15 NOTE — Letter (Signed)
Summary: Letter  Letter   Imported By: Lind Guest 12/04/2008 13:37:59  _____________________________________________________________________  External Attachment:    Type:   Image     Comment:   External Document

## 2010-06-15 NOTE — Assessment & Plan Note (Signed)
Summary: RASH   Vital Signs:  Patient profile:   70 year old female Menstrual status:  hysterectomy Height:      65 inches Weight:      183.50 pounds BMI:     30.65 O2 Sat:      98 % Pulse rate:   66 / minute Pulse rhythm:   regular Resp:     16 per minute BP sitting:   180 / 90  (left arm)  Vitals Entered By: Everitt Amber (April 22, 2009 9:19 AM)  Nutrition Counseling: Patient's BMI is greater than 25 and therefore counseled on weight management options. CC: has a itchy rash on chest for 3 days now, right shoulder hurts and right side of neck feels like it "catches" whens he moves it a certain way Is Patient Diabetic? Yes   Primary Care Provider:  Lodema Hong, M.D.  CC:  has a itchy rash on chest for 3 days now and right shoulder hurts and right side of neck feels like it "catches" whens he moves it a certain way.  History of Present Illness: Reports  that she has been doing fairly well Denies recent fever or chills. Denies sinus pressure, nasal congestion , ear pain or sore throat. Denies chest congestion, or cough productive of sputum. Denies chest pain, palpitations, PND, orthopnea or leg swelling. Denies abdominal pain, nausea, vomitting, diarrhea or constipation. Denies change in bowel movements or bloody stool. Denies dysuria , frequency, incontinence or hesitancy.  Denies headaches, vertigo, seizures. Denies depression, anxiety or insomnia.    Preventive Screening-Counseling & Management  Alcohol-Tobacco     Smoking Status: never  Current Medications (verified): 1)  Omeprazole 20 Mg Cpdr (Omeprazole) .... Take 1 Tablet By Mouth 30 Minutes Prior To Breakfast 2)  Benazepril Hcl 40 Mg Tabs (Benazepril Hcl) .... One Tab By Mouth Qd 3)  Simvastatin 40 Mg Tabs (Simvastatin) .... One Tab By Mouth Qhs 4)  Dulcolax 5 Mg Tbec (Bisacodyl) .Marland Kitchen.. 1 Tab By Mouth Bid 5)  Lantus Solostar 100 Unit/ml Soln (Insulin Glargine) .... 45 Units Daily 6)  Tribenzor 40-10-25 Mg Tabs  (Olmesartan-Amlodipine-Hctz) .... Take 1 Tablet By Mouth Once A Day 7)  Metoprolol Tartrate 50 Mg Tabs (Metoprolol Tartrate) .... Take 1 Tablet By Mouth Two Times A Day 8)  Metformin Hcl 1000 Mg Tabs (Metformin Hcl) .... Take 1 Tablet By Mouth Two Times A Day 9)  Onglyza 5 Mg Tabs (Saxagliptin Hcl) .... Take 1 Tablet By Mouth Once A Day  Allergies (verified): 1)  * Onglyza  Review of Systems      See HPI Eyes:  Denies blurring and discharge. MS:  right neck pain radiating down the shoulder with a catch for the past 1 week. Derm:  Complains of rash; puritic rash on neck x 4 days, startyed 2 days after onglyza, never had this before. Endo:  Denies cold intolerance, excessive hunger, excessive thirst, excessive urination, heat intolerance, polyuria, and weight change; fastings from 80's y to 140 with  similar diet.  Physical Exam  General:  alert, well-hydrated, and overweight-appearing.  HEENT: No facial asymmetry,  EOMI, No sinus tenderness, TM's Clear, oropharynx  pink and moist.   Chest: Clear to auscultation bilaterally.  CVS: S1, S2, No murmurs, No S3.   Abd: Soft, Nontender.  MS: Adequate ROM spine, hips, shoulders and knees. decreased rOM cervical spine with trapezius spasm Ext: No edema.   CNS: CN 2-12 intact, power tone and sensation normal throughout.   Skin: Intact, no visible  lesions or rashes.  Psych: Good eye contact, normal affect.  Memory intact, not anxious or depressed appearing.      Impression & Recommendations:  Problem # 1:  DERMATITIS (ICD-692.9) Assessment Comment Only  Her updated medication list for this problem includes:    Betamethasone Dipropionate 0.05 % Crea (Betamethasone dipropionate) .Marland Kitchen... Apply to affected area twice daily as needed pt to discontinue onglyxza and use janumet  Problem # 2:  NECK PAIN (ICD-723.1) Assessment: Deteriorated anti-inflammatories and muscle relaxamnts suggested, pt to use ANTI-INFLAMMATORIES ONLY  Problem # 3:   DIABETES MELLITUS, TYPE II (ICD-250.00) Assessment: Unchanged  The following medications were removed from the medication list:    Lantus Solostar 100 Unit/ml Soln (Insulin glargine) .Marland KitchenMarland KitchenMarland KitchenMarland Kitchen 45 units daily    Onglyza 5 Mg Tabs (Saxagliptin hcl) .Marland Kitchen... Take 1 tablet by mouth once a day Her updated medication list for this problem includes:    Benazepril Hcl 40 Mg Tabs (Benazepril hcl) ..... One tab by mouth qd    Tribenzor 40-10-25 Mg Tabs (Olmesartan-amlodipine-hctz) .Marland Kitchen... Take 1 tablet by mouth once a day    Metformin Hcl 1000 Mg Tabs (Metformin hcl) .Marland Kitchen... Take 1 tablet by mouth two times a day    Lantus Solostar 100 Unit/ml Soln (Insulin glargine) .Marland KitchenMarland KitchenMarland KitchenMarland Kitchen 80 units daily  Orders: Glucose, (CBG) [phone])  Labs Reviewed: Creat: 0.68 (02/16/2009)    Reviewed HgBA1c results: 7.0 (03/05/2009)  6.8 (12/03/2008)  Problem # 4:  HYPERTENSION (ICD-401.9) Assessment: Deteriorated  Her updated medication list for this problem includes:    Benazepril Hcl 40 Mg Tabs (Benazepril hcl) ..... One tab by mouth qd    Tribenzor 40-10-25 Mg Tabs (Olmesartan-amlodipine-hctz) .Marland Kitchen... Take 1 tablet by mouth once a day    Metoprolol Tartrate 50 Mg Tabs (Metoprolol tartrate) .Marland Kitchen... Take 1 tablet by mouth two times a day  BP today: 180/90 Prior BP: 152/82 (04/08/2009)  Labs Reviewed: K+: 3.6 (02/16/2009) Creat: : 0.68 (02/16/2009)   Chol: 147 (02/16/2009)   HDL: 37 (02/16/2009)   LDL: 77 (02/16/2009)   TG: 163 (02/16/2009)  Complete Medication List: 1)  Omeprazole 20 Mg Cpdr (Omeprazole) .... Take 1 tablet by mouth 30 minutes prior to breakfast 2)  Benazepril Hcl 40 Mg Tabs (Benazepril hcl) .... One tab by mouth qd 3)  Simvastatin 40 Mg Tabs (Simvastatin) .... One tab by mouth qhs 4)  Dulcolax 5 Mg Tbec (Bisacodyl) .Marland Kitchen.. 1 tab by mouth bid 5)  Tribenzor 40-10-25 Mg Tabs (Olmesartan-amlodipine-hctz) .... Take 1 tablet by mouth once a day 6)  Metoprolol Tartrate 50 Mg Tabs (Metoprolol tartrate) .... Take 1 tablet  by mouth two times a day 7)  Metformin Hcl 1000 Mg Tabs (Metformin hcl) .... Take 1 tablet by mouth two times a day 8)  Lantus Solostar 100 Unit/ml Soln (Insulin glargine) .... 80 units daily 9)  Betamethasone Dipropionate 0.05 % Crea (Betamethasone dipropionate) .... Apply to affected area twice daily as needed 10)  Diclofenac Sodium 0.1 % Soln (Diclofenac sodium) .... Apply twice daily as needed to affected area  Patient Instructions: 1)  Nurse BP check in 10  days. 2)  F/U as before  with MD. 3)  Start lantus 50 units daily as of today. 4)  Stop onglyza. 5)  Med is sent in for your rash  and the arthritic pain in your shoulder Prescriptions: LANTUS SOLOSTAR 100 UNIT/ML SOLN (INSULIN GLARGINE) 80 units daily  #2400 units x 3   Entered by:   Worthy Keeler LPN   Authorized by:  Syliva Overman MD   Signed by:   Worthy Keeler LPN on 22/06/5425   Method used:   [facility] to        [facility] [phone]* (retail)       [address]       [place], [place]  [address]       Ph: [phone] or [phone]       Fax: [phone]   RxID:   [id number] DICLOFENAC SODIUM 0.1 % SOLN (DICLOFENAC SODIUM) apply twice daily as needed to affected area  #30 gm x 1   Entered and Authorized by:   Syliva Overman MD   Signed by:   Syliva Overman MD on 04/22/2009   Method used:   Electronically to        [facility]. [phone]* (retail)       [address]       [place], [place]  [address]       Ph: [phone] or [phone]       Fax: [phone]   RxID:   [id number] BETAMETHASONE DIPROPIONATE 0.05 % CREA (BETAMETHASONE DIPROPIONATE) apply to affected area twice daily as needed  #45 gm x 0   Entered and Authorized by:   Syliva Overman MD   Signed by:   Syliva Overman MD on 04/22/2009   Method used:   Electronically to        [facility] [phone]* (retail)       [address]       [place], [place]  [address]       Ph:  [phone] or [phone]       Fax: [phone]   RxID:   [id number] LANTUS SOLOSTAR 100 UNIT/ML SOLN (INSULIN GLARGINE) 80 units daily  #2400 units x 3   Entered and Authorized by:   Syliva Overman MD   Signed by:   Syliva Overman MD on 04/22/2009   Method used:   Electronically to        [facility]. [phone]* (retail)       [address]       [place], [place]  [address]       Ph: [phone] or [phone]       Fax: [phone]   RxID:   [id number]   Laboratory Results   Blood Tests   Date/Time Received: April 22, 2009  Date/Time Reported: April 22, 2009   Glucose (random): 95 mg/dL   (Normal Range: 94-765)

## 2010-06-15 NOTE — Letter (Signed)
[facility]   Imported By: Lind Guest 01/28/2010 08:56:26  _____________________________________________________________________  External Attachment:    Type:   Image     Comment:   External Document

## 2010-06-15 NOTE — Cardiovascular Report (Signed)
Summary: Card CHF Visit  Card CHF Visit   Imported By: Lind Guest 10/02/2008 16:60:63  _____________________________________________________________________  External Attachment:    Type:   Image     Comment:   External Document

## 2010-06-15 NOTE — Assessment & Plan Note (Signed)
Summary: office visit   Vital Signs:  Patient profile:   70 year old female Menstrual status:  hysterectomy Height:      65 inches Weight:      182.06 pounds BMI:     30.41 Pulse rate:   66 / minute Pulse rhythm:   regular Resp:     16 per minute BP sitting:   160 / 90  (left arm)  Vitals Entered By: Worthy Keeler LPN (March 05, 2009 10:34 AM)  Nutrition Counseling: Patient's BMI is greater than 25 and therefore counseled on weight management options. CC: follow-up visit Is Patient Diabetic? Yes  Pain Assessment Patient in pain? yes     Location: shoulder Intensity: 5 Type: aching Onset of pain  Intermittent   Primary Care Provider:  Lodema Hong, M.D.  CC:  follow-up visit.  History of Present Illness: Reports  that she has had an increased amt of stress for the last 2 months when shenearly lost her only child age 73 to ruptured heart valve. She has not been as diligent in diet andexercise in the past but intends to change this. Denies recent fever or chills. Denies sinus pressure, nasal congestion , ear pain or sore throat. Denies chest congestion, or cough productive of sputum. Denies chest pain, palpitations, PND, orthopnea or leg swelling. Denies abdominal pain, nausea, vomitting, diarrhea or constipation. Denies change in bowel movements or bloody stool. Denies dysuria , frequency, incontinence or hesitancy. Denies  joint pain, swelling, or reduced mobility. Denies headaches, vertigo, seizures. Denies depression, anxiety or insomnia. Denies  rash, lesions, or itch.     Allergies (verified): No Known Drug Allergies  Review of Systems      See HPI Endo:  Denies cold intolerance, excessive hunger, excessive thirst, excessive urination, heat intolerance, polyuria, and weight change; fasting sugars average 137.  Physical Exam  General:  alert, well-hydrated, and overweight-appearing.  HEENT: No facial asymmetry,  EOMI, No sinus tenderness, TM's Clear,  oropharynx  pink and moist.   Chest: Clear to auscultation bilaterally.  CVS: S1, S2, No murmurs, No S3.   Abd: Soft, Nontender.  MS: Adequate ROM spine, hips, shoulders and knees.  Ext: No edema.   CNS: CN 2-12 intact, power tone and sensation normal throughout.   Skin: Intact, no visible lesions or rashes.  Psych: Good eye contact, normal affect.  Memory intact, not anxious or depressed appearing.      Impression & Recommendations:  Problem # 1:  HYPERLIPIDEMIA (ICD-272.4) Assessment Improved  Her updated medication list for this problem includes:    Simvastatin 40 Mg Tabs (Simvastatin) ..... One tab by mouth qhs  Labs Reviewed: SGOT: 31 (02/16/2009)   SGPT: 32 (02/16/2009)   HDL:37 (02/16/2009), 41 (08/14/2008)  LDL:77 (02/16/2009), 132 (16/02/9603)  Chol:147 (02/16/2009), 197 (08/14/2008)  Trig:163 (02/16/2009), 120 (08/14/2008)  Problem # 2:  OBESITY, UNSPECIFIED (ICD-278.00) Assessment: Deteriorated  Ht: 65 (03/05/2009)   Wt: 182.06 (03/05/2009)   BMI: 30.41 (03/05/2009)  Problem # 3:  DIABETES MELLITUS, TYPE II (ICD-250.00) Assessment: Deteriorated  The following medications were removed from the medication list:    Diovan Hct 320-25 Mg Tabs (Valsartan-hydrochlorothiazide) .Marland Kitchen... Take 1 tablet by mouth once a day Her updated medication list for this problem includes:    Benazepril Hcl 40 Mg Tabs (Benazepril hcl) ..... One tab by mouth qd    Lantus Solostar 100 Unit/ml Soln (Insulin glargine) .Marland KitchenMarland KitchenMarland KitchenMarland Kitchen 45 units daily    Janumet 50-1000 Mg Tabs (Sitagliptin-metformin hcl) ..... Marland Kitchentake 1 tablet by mouth two  times a day    Tribenzor 40-10-25 Mg Tabs (Olmesartan-amlodipine-hctz) .Marland Kitchen... Take 1 tablet by mouth once a day  Orders: Glucose, (CBG) (82962) Hemoglobin A1C (83036)  Labs Reviewed: Creat: 0.68 (02/16/2009)    Reviewed HgBA1c results: 7.0 (03/05/2009)  6.8 (12/03/2008)  Problem # 4:  HYPERTENSION (ICD-401.9) Assessment: Deteriorated  The following medications  were removed from the medication list:    Diovan Hct 320-25 Mg Tabs (Valsartan-hydrochlorothiazide) .Marland Kitchen... Take 1 tablet by mouth once a day    Amlodipine Besylate 10 Mg Tabs (Amlodipine besylate) ..... One tab by mouth qd Her updated medication list for this problem includes:    Metoprolol Tartrate 25 Mg Tabs (Metoprolol tartrate) .Marland Kitchen... Take 1 tablet by mouth two times a day    Benazepril Hcl 40 Mg Tabs (Benazepril hcl) ..... One tab by mouth qd    Tribenzor 40-10-25 Mg Tabs (Olmesartan-amlodipine-hctz) .Marland Kitchen... Take 1 tablet by mouth once a day  BP today: 160/90 Prior BP: 160/80 (12/03/2008)  Labs Reviewed: K+: 3.6 (02/16/2009) Creat: : 0.68 (02/16/2009)   Chol: 147 (02/16/2009)   HDL: 37 (02/16/2009)   LDL: 77 (02/16/2009)   TG: 163 (02/16/2009)  Complete Medication List: 1)  Metoprolol Tartrate 25 Mg Tabs (Metoprolol tartrate) .... Take 1 tablet by mouth two times a day 2)  Omeprazole 20 Mg Cpdr (Omeprazole) .... Take 1 tablet by mouth 30 minutes prior to breakfast 3)  Benazepril Hcl 40 Mg Tabs (Benazepril hcl) .... One tab by mouth qd 4)  Simvastatin 40 Mg Tabs (Simvastatin) .... One tab by mouth qhs 5)  Colace 100 Mg Caps (Docusate sodium) .... One cap by mouth bid 6)  Dulcolax 5 Mg Tbec (Bisacodyl) .Marland Kitchen.. 1 tab by mouth bid 7)  Lantus Solostar 100 Unit/ml Soln (Insulin glargine) .... 45 units daily 8)  Janumet 50-1000 Mg Tabs (Sitagliptin-metformin hcl) .... .take 1 tablet by mouth two times a day 9)  Tribenzor 40-10-25 Mg Tabs (Olmesartan-amlodipine-hctz) .... Take 1 tablet by mouth once a day  Patient Instructions: 1)  nurse visit for bP check in 5 weeks. 2)  mD visit in 3 months. 3)  new dose of lantus is 45 units once daily.Marland Kitchen 4)  Trget fasting blood sugar is 100 to 120. 5)  pls commit to 30 mins of walking at least 5 days per week. 6)  your blood pressure is still too high , med changs as discussed Prescriptions: LANTUS SOLOSTAR 100 UNIT/ML SOLN (INSULIN GLARGINE) 45 units  daily  #1350 units x 4   Entered and Authorized by:   Syliva Overman MD   Signed by:   Syliva Overman MD on 03/05/2009   Method used:   Electronically to        [facility] [phone]* (retail)       [address]       [place], [place]  [address]       Ph: [phone] or [phone]       Fax: [phone]   RxID:   [id number] TRIBENZOR 40-10-25 MG TABS [facility]) Take 1 tablet by mouth once a day  #30 x 3   Entered and Authorized by:   Syliva Overman MD   Signed by:   Syliva Overman MD on 03/05/2009   Method used:   [facility]ronically to        [facility] [phone]* (retail)       [address]       [place]  [place], [place]  [address]       Ph: [phone] or [phone]       Fax: [phone]   RxID:   [id number]   Laboratory Results   Blood Tests   Date/Time Received: 03/05/09 Date/Time Reported: 03/05/09  HGBA1C: 7.0%   (Normal Range: Non-Diabetic - 3-6%   Control Diabetic - 6-8%)

## 2010-06-15 NOTE — Assessment & Plan Note (Signed)
Summary: office visit   Vital Signs:  Patient profile:   70 year old female Menstrual status:  hysterectomy Height:      65 inches Weight:      173.50 pounds BMI:     28.98 O2 Sat:      97 % on Room air Pulse rate:   67 / minute Pulse rhythm:   regular Resp:     16 per minute BP sitting:   150 / 90  (left arm)  Vitals Entered By: Adella Hare LPN (March 02, 2010 11:24 AM)  Nutrition Counseling: Patient's BMI is greater than 25 and therefore counseled on weight management options.  O2 Flow:  Room air CC: follow-up visit Is Patient Diabetic? Yes Did you bring your meter with you today? No   Primary Care Provider:  Lodema Hong, M.D.  CC:  follow-up visit.  History of Present Illness: Reports  that she has been  doing well. Denies recent fever or chills. Denies sinus pressure, nasal congestion , ear pain or sore throat. Denies chest congestion, or cough productive of sputum. Denies chest pain, palpitations, PND, orthopnea or leg swelling. Denies abdominal pain, nausea, vomitting, diarrhea or constipation. Denies change in bowel movements or bloody stool. Denies dysuria , frequency, incontinence or hesitancy. Denies  joint pain, swelling, or reduced mobility. Denies headaches, vertigo, seizures. Denies depression, anxiety or insomnia. Denies  rash, lesions, or itch.     Current Medications (verified): 1)  Benazepril Hcl 40 Mg Tabs (Benazepril Hcl) .... One Tab By Mouth Qd 2)  Dulcolax 5 Mg Tbec (Bisacodyl) .Marland Kitchen.. 1 Tab By Mouth Two Times A Day As Needed 3)  Metoprolol Tartrate 50 Mg Tabs (Metoprolol Tartrate) .... Take 1 Tablet By Mouth Two Times A Day 4)  Metformin Hcl 1000 Mg Tabs (Metformin Hcl) .... Take 1 Tablet By Mouth Two Times A Day 5)  Lantus Solostar 100 Unit/ml Soln (Insulin Glargine) .... 40 Units Daily 6)  Aspir-Low 81 Mg Tbec (Aspirin) .... Take 1 Tablet By Mouth Once A Day 7)  Lovastatin 40 Mg Tabs (Lovastatin) .... Take 1 Tab By Mouth At Bedtime 8)   Amlodipine Besylate 10 Mg Tabs (Amlodipine Besylate) .... Take 1 Tablet By Mouth Once A Day 9)  Maxzide-25 37.5-25 Mg Tabs (Triamterene-Hctz) .... Take 1 Tablet By Mouth Once A Day 10)  Clonidine Hcl 0.2 Mg Tabs (Clonidine Hcl) .... Take 1 Tab By Mouth At Bedtime 11)  Travatan Z 0.004 % Soln (Travoprost) .... One Drop in Each Eye At Bedtime 12)  Bd Pen Needle Short U/f 31g X 8 Mm Misc (Insulin Pen Needle) .... Once Daily Use With Lantus  Allergies (verified): 1)  * Onglyza  Review of Systems      See HPI Eyes:  Denies blurring and discharge. Endo:  Denies excessive thirst and excessive urination; tests daily, and blood sugars are within range. Heme:  Denies abnormal bruising and bleeding. Allergy:  Denies hives or rash and itching eyes.  Physical Exam  General:  Well-developed,well-nourished,in no acute distress; alert,appropriate and cooperative throughout examination HEENT: No facial asymmetry,  EOMI, No sinus tenderness, TM's Clear, oropharynx  pink and moist.   Chest: Clear to auscultation bilaterally.  CVS: S1, S2, No murmurs, No S3.   Abd: Soft, Nontender.  MS: Adequate ROM spine, hips, shoulders and knees.  Ext: No edema.   CNS: CN 2-12 intact, power tone and sensation normal throughout.   Skin: Intact, no visible lesions or rashes.  Psych: Good eye contact, normal affect.  Memory intact, not anxious or depressed appearing.    Impression & Recommendations:  Problem # 1:  HYPERLIPIDEMIA (ICD-272.4) Assessment Improved  Her updated medication list for this problem includes:    Lovastatin 40 Mg Tabs (Lovastatin) .Marland Kitchen... Take 1 tab by mouth at bedtime Low fat dietdiscussed and encouraged  Labs Reviewed: SGOT: 18 (01/14/2010)   SGPT: 17 (01/14/2010)   HDL:42 (01/14/2010), 37 (02/16/2009)  LDL:87 (01/14/2010), 77 (02/16/2009)  Chol:153 (01/14/2010), 147 (02/16/2009)  Trig:122 (01/14/2010), 163 (02/16/2009)  Problem # 2:  HYPERTENSION (ICD-401.9) Assessment:  Unchanged  The following medications were removed from the medication list:    Maxzide-25 37.5-25 Mg Tabs (Triamterene-hctz) .Marland Kitchen... Take 1 tablet by mouth once a day Her updated medication list for this problem includes:    Benazepril Hcl 40 Mg Tabs (Benazepril hcl) ..... One tab by mouth qd    Metoprolol Tartrate 50 Mg Tabs (Metoprolol tartrate) .Marland Kitchen... Take 1 tablet by mouth two times a day    Amlodipine Besylate 10 Mg Tabs (Amlodipine besylate) .Marland Kitchen... Take 1 tablet by mouth once a day    Clonidine Hcl 0.2 Mg Tabs (Clonidine hcl) .Marland Kitchen... Take 1 tab by mouth at bedtime    Maxzide-25 37.5-25 Mg Tabs (Triamterene-hctz) ..... One and a half tablets once daily Patient advised to follow low sodium diet rich in fruit and vegetables, and to commit to at least 30 minutes 5 days per week of regular exercise , to improve blood presure control.   Orders: T-Basic Metabolic Panel [id number]) Medicare Electronic Prescription [id number])  BP today: 150/90 Prior BP: 160/84 (01/13/2010)  Labs Reviewed: K+: 3.8 (01/14/2010) Creat: : 0.77 (01/14/2010)   Chol: 153 (01/14/2010)   HDL: 42 (01/14/2010)   LDL: 87 (01/14/2010)   TG: 122 (01/14/2010)  Problem # 3:  DIABETES MELLITUS, TYPE II (ICD-250.00) Assessment: Unchanged  Her updated medication list for this problem includes:    Benazepril Hcl 40 Mg Tabs (Benazepril hcl) ..... One tab by mouth qd    Metformin Hcl 1000 Mg Tabs (Metformin hcl) .Marland Kitchen... Take 1 tablet by mouth two times a day    Lantus Solostar 100 Unit/ml Soln (Insulin glargine) .Marland KitchenMarland KitchenMarland KitchenMarland Kitchen 40 units daily    Aspir-low 81 Mg Tbec (Aspirin) .Marland Kitchen... Take 1 tablet by mouth once a day  Orders: T- Hemoglobin A1C ([id number])  Labs Reviewed: Creat: 0.77 (01/14/2010)    Reviewed HgBA1c results: 6.4 (01/14/2010)  6.5 (09/14/2009)  Complete Medication List: 1)  Benazepril Hcl 40 Mg Tabs (Benazepril hcl) .... One tab by mouth qd 2)  Dulcolax 5 Mg Tbec (Bisacodyl) .Marland Kitchen.. 1 tab by mouth two times a day as  needed 3)  Metoprolol Tartrate 50 Mg Tabs (Metoprolol tartrate) .... Take 1 tablet by mouth two times a day 4)  Metformin Hcl 1000 Mg Tabs (Metformin hcl) .... Take 1 tablet by mouth two times a day 5)  Lantus Solostar 100 Unit/ml Soln (Insulin glargine) .... 40 units daily 6)  Aspir-low 81 Mg Tbec (Aspirin) .... Take 1 tablet by mouth once a day 7)  Lovastatin 40 Mg Tabs (Lovastatin) .... Take 1 tab by mouth at bedtime 8)  Amlodipine Besylate 10 Mg Tabs (Amlodipine besylate) .... Take 1 tablet by mouth once a day 9)  Clonidine Hcl 0.2 Mg Tabs (Clonidine hcl) .... Take 1 tab by mouth at bedtime 10)  Travatan Z 0.004 % Soln (Travoprost) .... One drop in each eye at bedtime 11)  Bd Pen Needle Short U/f 31g X 8 Mm Misc (Insulin pen needle) .... Once daily  use with lantus 12)  Maxzide-25 37.5-25 Mg Tabs (Triamterene-hctz) .... One and a half tablets once daily  Patient Instructions: 1)  F/U mid January. 2)  Dose increase in triamt/hctz is ONE and A HALF tablets once daily. 3)     4)  HbgA1C prior to visit, ICD-9: 5)  BMP prior to visit, ICD-9:   mid january , non fasting 6)  It is important that you exercise regularly at least 30 minutes 5 times a week. If you develop chest pain, have severe difficulty breathing, or feel very tired , stop exercising immediately and seek medical attention. 7)  You need to lose weight. Consider a lower calorie diet and regular exercise. 8)  Your  sugar and cholesterol were excellent  when last checked, keep it up 9)  The medication list was reviewed and reconciled..All changed/newly prescribed medications were explained. A complete medication list was provided to the patient/caregiver.  10)    Prescriptions: MAXZIDE-25 37.5-25 MG TABS (TRIAMTERENE-HCTZ) one and a half tablets once daily  #135 x 1   Entered and Authorized by:   Syliva Overman MD   Signed by:   Syliva Overman MD on 03/02/2010   Method used:   Printed then faxed to ...       [address]  [phone]* (retail)       [address]       [place], [place]  [address]       Ph: [phone] or [phone]       Fax: [phone]   RxID:   [id number] BD PEN NEEDLE SHORT U/F 31G X 8 MM MISC (INSULIN PEN NEEDLE) once daily use with lantus  #100 x 3   Entered by:   Adella Hare LPN   Authorized by:   Syliva Overman MD   Signed by:   Adella Hare LPN on 36/64/4034   Method used:   Electronically to        [facility] [phone]* (retail)       [address]       [place], [place]  [address]       Ph: [phone] or [phone]       Fax: [phone]   RxID:   [id number]    Orders Added: 1)  Est. Patient Level IV [62831] 2)  T-Basic Metabolic Panel [id number] 3)  T- Hemoglobin A1C [[id number]] 4)  Medicare Electronic Prescription [T0626]

## 2010-06-15 NOTE — Assessment & Plan Note (Signed)
Summary: stringly stools,abd pain/ams   Visit Type:  Initial Consult Primary Care Provider:  Lodema Hong, M.D.  Chief Complaint:  abd pain and constipation.  History of Present Illness: Belly problems since 2010. Feels full all the time and feels full. Belching alot, but that's better. S/t burning sensation in mid/upr abdomen. [id number] made it better since one month. Still doesn't feel normal. Feels RUQ pressure and moves down into RLQ. Always had constipation but now worse. No new medicines. Drinks water. Thinks get pretty good amount of fiber. No problems swallowing-now but felt like to cough and clear throat but better since thyroid was removed. No BRBPR. Tried Dulcolax 2 two times a day and Colace 1 two times a day. Stopped Miralax once daily and wasn't helping. TCS 6 years ago in [place], MD-sml polyp. Heartburn but now better. Feeling hb > 2x/wk similar disconfort as in her abdoment. No vomiting. S/t nausea (rare). No blood in stool or black tarry stools. Appetite pretty good. Energy level good. Never problem with diarrhea.  Current Medications (verified): 1)  Lantus Solostar 100 Unit/ml Soln (Insulin Glargine) .... 40 Units Daily 2)  Janumet 50-1000 Mg Tabs (Sitagliptin-Metformin Hcl) .... Take 1 Tablet By Mouth Two Times A Day 3)  Diovan Hct 320-25 Mg Tabs (Valsartan-Hydrochlorothiazide) .... Take 1 Tablet By Mouth Once A Day 4)  Miralax  Powd (Polyethylene Glycol 3350) .Marland Kitchen.. 17gm in 8oz of Water Qd 5)  Metoprolol Tartrate 25 Mg Tabs (Metoprolol Tartrate) .... Take 1 Tablet By Mouth Two Times A Day 6)  Omeprazole 20 Mg Cpdr (Omeprazole) .... Take 1 Tablet By Mouth Once A Day 7)  Amlodipine Besylate 10 Mg Tabs (Amlodipine Besylate) .... One Tab By Mouth Qd 8)  Benazepril Hcl 40 Mg Tabs (Benazepril Hcl) .... One Tab By Mouth Qd 9)  Simvastatin 40 Mg Tabs (Simvastatin) .... One Tab By Mouth Qhs 10)  Colace 100 Mg Caps (Docusate Sodium) .... One Cap By Mouth Bid 11)  Dulcolax 5 Mg  Tbec (Bisacodyl) .... One Tab By Mouth Every Three Days Prn  Allergies (verified): No Known Drug Allergies  Past History:  Past Medical History: Reviewed history from 08/06/2008 and no changes required. Hypertension x 20 years Diabetes x 25 years Hyperlipidemia x 10 years obesity  lifelong  Past Surgical History: Hysterectomy- 1994, for fibroids Thyroidectomy Nov 2009-goiter Vaginal prolapse Ovarian tumors removed-no cancer cataract surgery  on both eyes, 1998 and 2000  Family History: Reviewed history from 08/06/2008 and no changes required. Mother-deceased-stomach cancer, heart attack 60's Father-deceased-heart attack 50's 1 sister-living-multiple myleoma  3 sisters-hypertension 1 sister deceased at 44 brother died at age 61 mVA  Social History: Reviewed history from 08/06/2008 and no changes required. Retired in 1999, was a Scientist, product/process development Married x 32 yrs Never Smoked Alcohol use-no Drug use-no Regular exercise-yes one son hTN  Review of Systems       may 2010: H pylori Ab 0.17 Aug 2008 TSH NL Otherwise all systems negative.  Vital Signs:  Patient profile:   70 year old female Menstrual status:  hysterectomy Height:      65 inches Weight:      182 pounds BMI:     30.40 Temp:     98.6 degrees F oral Pulse rate:   64 / minute BP sitting:   120 / 78  (left arm) Cuff size:   regular  Vitals Entered By: Hendricks Limes LPN (November 13, 8117 2:49 PM)  Physical Exam  General:  Well developed, well nourished,  no acute distress. Head:  Normocephalic and atraumatic. Mouth:  No deformity or lesions, dentition normal. Neck:  Supple; no masses. Incision well healed. Lungs:  Clear throughout to auscultation. Heart:  Regular rate and rhythm; no murmurs, rubs,  or bruits. Abdomen:  Soft, nontender and nondistended. No masses, hepatosplenomegaly or hernias noted. Normal bowel sounds. Msk:  Symmetrical with no gross deformities. Normal posture. Extremities:  No edema or  deformities noted. Neurologic:  Alert and  oriented x4;  grossly normal neurologically.  Impression & Recommendations:  Problem # 1:  CONSTIPATION (ICD-564.00) Assessment Deteriorated Likely secondary to IBS-c. Given high fiber diet HO. Drink water. HO give on the management of constipation. She declined Rx for AMITIZA. Will go to [facility] for tea for constipation. OPV in 2 mos. Pt will call and give me name of physician for outside MD who performed TCS 6 years ago.  Problem # 2:  DYSPEPSIA&OTHER SPEC DISORDERS FUNCTION STOMACH (ICD-536.8) Assessment: New Differential: non-ulcer dyspepsia, esohagitis, H pylori gastritis, and low likelihood of gastric malignancy. Improved but not resolved with omeprazole. HO give on managment of reflux. Low fat diet.  EGD within the next two weeks. OPV in 2 mos.  CC: Lodema Hong, M.D. Prescriptions: OMEPRAZOLE 20 MG CPDR (OMEPRAZOLE) Take 1 tablet by mouth 30 minutes prior to breakfast  #30 x 5   Entered and Authorized by:   Jacques Navy MD   Signed by:   Jacques Navy MD on 11/13/2008   Method used:   Electronically to        [facility] [phone]* (retail)       [address]       [place], [place]  [address]       Ph: [phone] or [phone]       Fax: [phone]   RxID:   [id number]   Appended Document: Orders Update-charge    Clinical Lists Changes  Problems: Added new problem of GERD (ICD-530.81) Orders: Added new Service order of New Patient Level V [phone]) - Signed

## 2010-06-15 NOTE — Progress Notes (Signed)
Summary: MEDICINE  Phone Note Call from Patient   Summary of Call: LEFT MESSAGE GOING OUT OF TOWN NOW AND NEEDS HER I THINK TRIBENZOR SENT TO [facility] Initial call taken by: Lind Guest,  January 07, 2010 9:48 AM  Follow-up for Phone Call        Already spoke to pharmacy and told her to refill for 1 month and for patient to make app for early sept  Follow-up by: Everitt Amber LPN,  January 07, 2010 10:20 AM

## 2010-06-15 NOTE — Progress Notes (Signed)
  Phone Note Call from Patient   Summary of Call: Rich at [facility] faxed and said that he doesn't think they make Tribenzor 10-40-25 but they do make 10-40-12.5. If they don't make it then why were you able to pull it up in the meds in EMR? That doesn't make any sense. Initial call taken by: Everitt Amber,  March 05, 2009 2:05 PM  Follow-up for Phone Call        no it does not, call the pharmacist and discuss this with them Follow-up by: Syliva Overman MD,  March 05, 2009 4:38 PM  Additional Follow-up for Phone Call Additional follow up Details #1::        pharmacist states he will check into it Additional Follow-up by: Worthy Keeler LPN,  March 05, 2009 4:57 PM

## 2010-06-15 NOTE — Assessment & Plan Note (Signed)
Summary: OV   Vital Signs:  Patient profile:   70 year old female Menstrual status:  hysterectomy Height:      65 inches Weight:      173.8 pounds BMI:     29.03 O2 Sat:      97 % Pulse rate:   66 / minute Pulse rhythm:   regular Resp:     16 per minute BP sitting:   160 / 84  (left arm) Cuff size:   regular  Vitals Entered By: Everitt Amber LPN (January 13, 2010 3:10 PM)  Nutrition Counseling: Patient's BMI is greater than 25 and therefore counseled on weight management options. CC: Follow up chronic problems, wants tribenzor changed because its affecting her eyesight   Primary Care Provider:  Lodema Hong, M.D.  CC:  Follow up chronic problems and wants tribenzor changed because its affecting her eyesight.  History of Present Illness: Reports  that she has been doing fairly well, she does however want her tribenzor changed, thinks it is bothering her eyesight , she now has glaucoma. Denies recent fever or chills. Denies sinus pressure, nasal congestion , ear pain or sore throat. Denies chest congestion, or cough productive of sputum. Denies chest pain, palpitations, PND, orthopnea or leg swelling. Denies abdominal pain, nausea, vomitting, diarrhea or constipation. Denies change in bowel movements or bloody stool. Denies dysuria , frequency, incontinence or hesitancy. Denies  joint pain, swelling, or reduced mobility. Denies headaches, vertigo, seizures. Denies depression, anxiety or insomnia. Denies  rash, lesions, or itch. She denies polyuria, polydypsia, blurred vision or hypoglycemic episodes, she is concerned that her sugars a re not oias good as they should be, she tests twice daily and has brought her diary.     Current Medications (verified): 1)  Omeprazole 20 Mg Cpdr (Omeprazole) .... Take 1 Tablet By Mouth 30 Minutes Prior To Breakfast 2)  Benazepril Hcl 40 Mg Tabs (Benazepril Hcl) .... One Tab By Mouth Qd 3)  Simvastatin 40 Mg Tabs (Simvastatin) .... One Tab By  Mouth Qhs 4)  Dulcolax 5 Mg Tbec (Bisacodyl) .Marland Kitchen.. 1 Tab By Mouth Two Times A Day As Needed 5)  Tribenzor 40-10-25 Mg Tabs (Olmesartan-Amlodipine-Hctz) .... Take 1 Tablet By Mouth Once A Day 6)  Metoprolol Tartrate 50 Mg Tabs (Metoprolol Tartrate) .... Take 1 Tablet By Mouth Two Times A Day 7)  Metformin Hcl 1000 Mg Tabs (Metformin Hcl) .... Take 1 Tablet By Mouth Two Times A Day 8)  Lantus Solostar 100 Unit/ml Soln (Insulin Glargine) .... 40 Units Daily 9)  Clonidine Hcl 0.1 Mg Tabs (Clonidine Hcl) .... Take 1 Tab By Mouth At Bedtime 10)  Aspir-Low 81 Mg Tbec (Aspirin) .... Take 1 Tablet By Mouth Once A Day  Allergies (verified): 1)  * Onglyza  Past History:  Past medical, surgical, family and social histories (including risk factors) reviewed, and no changes noted (except as noted below).  Past Medical History: Hypertension x 20 years Diabetes x 25 years Hyperlipidemia x 10 years obesity  lifelong TCS 6 years ago in [place], [place]-sml polyp Glaucoma dx in 2011  Past Surgical History: Reviewed history from 11/13/2008 and no changes required. Hysterectomy- 1994, for fibroids Thyroidectomy Nov 2009-goiter Vaginal prolapse Ovarian tumors removed-no cancer cataract surgery  on both eyes, 1998 and 2000  Family History: Reviewed history from 08/06/2009 and no changes required. Mother-deceased-stomach cancer, heart attack 60's Father-deceased-heart attack 50's 1 sister-living-multiple myleoma, dieed in 2011 at age 58  3 sisters-hypertension 1 sister deceased at 76  due  to unknown cause, multiorgan failure brother died at age 58 mVA  Social History: Reviewed history from 08/06/2008 and no changes required. Retired in 1999, was a Scientist, product/process development Married x 32 yrs Never Smoked Alcohol use-no Drug use-no Regular exercise-yes one son hTN  Review of Systems      See HPI General:  Complains of fatigue. Eyes:  Complains of vision loss-both eyes; denies discharge, eye pain, and  red eye. Endo:  Denies excessive thirst and excessive urination. Heme:  Denies abnormal bruising and bleeding. Allergy:  Denies hives or rash and itching eyes.  Physical Exam  General:  Well-developed,well-nourished,in no acute distress; alert,appropriate and cooperative throughout examination HEENT: No facial asymmetry,  EOMI, No sinus tenderness, TM's Clear, oropharynx  pink and moist.   Chest: Clear to auscultation bilaterally.  CVS: S1, S2, No murmurs, No S3.   Abd: Soft, Nontender.  MS: Adequate ROM spine, hips, shoulders and knees.  Ext: No edema.   CNS: CN 2-12 intact, power tone and sensation normal throughout.   Skin: Intact, no visible lesions or rashes.  Psych: Good eye contact, normal affect.  Memory intact, not anxious or depressed appearing.    Impression & Recommendations:  Problem # 1:  HYPERLIPIDEMIA (ICD-272.4) Assessment Comment Only  The following medications were removed from the medication list:    Simvastatin 40 Mg Tabs (Simvastatin) ..... One tab by mouth qhs Her updated medication list for this problem includes:    Lovastatin 40 Mg Tabs (Lovastatin) .Marland Kitchen... Take 1 tab by mouth at bedtime  Orders: T-Hepatic Function [id number]) T-Lipid Profile [id number])  Labs Reviewed: SGOT: 31 (02/16/2009)   SGPT: 32 (02/16/2009)   HDL:37 (02/16/2009), 41 (08/14/2008)  LDL:77 (02/16/2009), 132 (29/56/2130)  Chol:147 (02/16/2009), 197 (08/14/2008)  Trig:163 (02/16/2009), 120 (08/14/2008)  Problem # 2:  HYPERTENSION (ICD-401.9) Assessment: Deteriorated  The following medications were removed from the medication list:    Tribenzor 40-10-25 Mg Tabs (Olmesartan-amlodipine-hctz) .Marland Kitchen... Take 1 tablet by mouth once a day    Clonidine Hcl 0.1 Mg Tabs (Clonidine hcl) .Marland Kitchen... Take 1 tab by mouth at bedtime Her updated medication list for this problem includes:    Benazepril Hcl 40 Mg Tabs (Benazepril hcl) ..... One tab by mouth qd    Metoprolol Tartrate 50 Mg Tabs  (Metoprolol tartrate) .Marland Kitchen... Take 1 tablet by mouth two times a day    Amlodipine Besylate 10 Mg Tabs (Amlodipine besylate) .Marland Kitchen... Take 1 tablet by mouth once a day    Maxzide-25 37.5-25 Mg Tabs (Triamterene-hctz) .Marland Kitchen... Take 1 tablet by mouth once a day    Clonidine Hcl 0.2 Mg Tabs (Clonidine hcl) .Marland Kitchen... Take 1 tab by mouth at bedtime  Orders: T-Basic Metabolic Panel [id number])  BP today: 160/84 Prior BP: 132/88 (08/21/2009)  Labs Reviewed: K+: 3.6 (02/16/2009) Creat: : 0.68 (02/16/2009)   Chol: 147 (02/16/2009)   HDL: 37 (02/16/2009)   LDL: 77 (02/16/2009)   TG: 163 (02/16/2009)  Problem # 3:  DIABETES MELLITUS, TYPE II (ICD-250.00) Assessment: Comment Only  The following medications were removed from the medication list:    Tribenzor 40-10-25 Mg Tabs (Olmesartan-amlodipine-hctz) .Marland Kitchen... Take 1 tablet by mouth once a day Her updated medication list for this problem includes:    Benazepril Hcl 40 Mg Tabs (Benazepril hcl) ..... One tab by mouth qd    Metformin Hcl 1000 Mg Tabs (Metformin hcl) .Marland Kitchen... Take 1 tablet by mouth two times a day    Lantus Solostar 100 Unit/ml Soln (Insulin glargine) .Marland KitchenMarland KitchenMarland KitchenMarland Kitchen 40 units daily  Aspir-low 81 Mg Tbec (Aspirin) .Marland Kitchen... Take 1 tablet by mouth once a day  Orders: T-Urine Microalbumin w/creat. ratio [phone]) T- Hemoglobin A1C [phone])  Labs Reviewed: Creat: 0.68 (02/16/2009)    Reviewed HgBA1c results: 6.5 (09/14/2009)  6.8 (06/11/2009)  Complete Medication List: 1)  Omeprazole 20 Mg Cpdr (Omeprazole) .... Take 1 tablet by mouth 30 minutes prior to breakfast 2)  Benazepril Hcl 40 Mg Tabs (Benazepril hcl) .... One tab by mouth qd 3)  Dulcolax 5 Mg Tbec (Bisacodyl) .Marland Kitchen.. 1 tab by mouth two times a day as needed 4)  Metoprolol Tartrate 50 Mg Tabs (Metoprolol tartrate) .... Take 1 tablet by mouth two times a day 5)  Metformin Hcl 1000 Mg Tabs (Metformin hcl) .... Take 1 tablet by mouth two times a day 6)  Lantus Solostar 100 Unit/ml Soln  (Insulin glargine) .... 40 units daily 7)  Aspir-low 81 Mg Tbec (Aspirin) .... Take 1 tablet by mouth once a day 8)  Lovastatin 40 Mg Tabs (Lovastatin) .... Take 1 tab by mouth at bedtime 9)  Amlodipine Besylate 10 Mg Tabs (Amlodipine besylate) .... Take 1 tablet by mouth once a day 10)  Maxzide-25 37.5-25 Mg Tabs (Triamterene-hctz) .... Take 1 tablet by mouth once a day 11)  Clonidine Hcl 0.2 Mg Tabs (Clonidine hcl) .... Take 1 tab by mouth at bedtime 12)  Travatan Z 0.004 % Soln (Travoprost) .... One drop in each eye at bedtime  Other Orders: T-TSH [phone]) T-CBC w/Diff [phone]) T-Vitamin D (25-Hydroxy) [phone]) Pneumococcal Vaccine (87564) Admin 1st Vaccine (33295) Admin 1st Vaccine [facility]) [phone])  Patient Instructions: 1)  F/U in 6 weeks 2)  New meds  for blood pressure are amlodipine 10mg  , mazide 25mg  and in c dose of clonidine to 0.2mg   (pls take two clonidine 0.1mg  tabs at bedtime till they are dione)continue benazepril. 3)  STOP tribenzor/hctz 4)  Urine Microalbumin prior to visit, ICD-9:  send today 5)  BMP prior to visit, ICD-9: 6)  Hepatic Panel prior to visit, ICD-9: 7)  Lipid Panel prior to visit, ICD-9:   fasting asap 8)  TSH prior to visit, ICD-9: 9)  HbgA1C prior to visit, ICD-9: 10)  CBC w/ Diff prior to visit, ICD-9: 11)  Vitamion D Prescriptions: METFORMIN HCL 1000 MG TABS (METFORMIN HCL) Take 1 tablet by mouth two times a day  #180 x 3   Entered by:   Everitt Amber LPN   Authorized by:   Syliva Overman MD   Signed by:   Everitt Amber LPN on 60/63/0160   Method used:   Electronically to        [facility] [phone]* (retail)       [address]       [place], [place]  [address]       Ph: [phone] or [phone]       Fax: [phone]   RxID:   [id number] METOPROLOL TARTRATE 50 MG TABS (METOPROLOL TARTRATE) Take 1 tablet by mouth two times a day  #180 x 3   Entered by:   Everitt Amber LPN   Authorized by:   Syliva Overman MD   Signed by:   Everitt Amber LPN on 09/38/1829   Method used:   Electronically to        [facility] [phone]* (retail)       [address]       [place],  [place]  [address]       Ph: [phone] or [phone]       Fax: [phone]   RxID:   [id number] CLONIDINE HCL 0.2 MG TABS (CLONIDINE HCL) Take 1 tab by mouth at bedtime  #90 x 1   Entered and Authorized by:   Syliva Overman MD   Signed by:   Syliva Overman MD on 01/13/2010   Method used:   Printed then faxed to ...       [address] [phone]* (retail)       [address]       [place], [place]  [address]       Ph: [phone] or [phone]       Fax: [phone]   RxID:   [id number] MAXZIDE-25 37.5-25 MG TABS (TRIAMTERENE-HCTZ) Take 1 tablet by mouth once a day  #90 x 1   Entered and Authorized by:   Syliva Overman MD   Signed by:   Syliva Overman MD on 01/13/2010   Method used:   Printed then faxed to ...       [address] [phone]* (retail)       [address]       [place], [place]  [address]       Ph: [phone] or [phone]       Fax: [phone]   RxID:   [id number] AMLODIPINE BESYLATE 10 MG TABS (AMLODIPINE BESYLATE) Take 1 tablet by mouth once a day  #30 x 3   Entered and Authorized by:   Syliva Overman MD   Signed by:   Syliva Overman MD on 01/13/2010   Method used:   Printed then faxed to ...       [address] [phone]* (retail)       [address]       [place], [place]  [address]       Ph: [phone] or [phone]       Fax: [phone]   RxID:   [id number] LOVASTATIN 40 MG TABS (LOVASTATIN) Take 1 tab by mouth at bedtime  #30 x 3   Entered and Authorized by:   Syliva Overman MD   Signed by:   Syliva Overman MD on 01/13/2010   Method used:   Printed then faxed to ...       [address] [phone]* (retail)       [address]       [place], [place]  [address]        Ph: [phone] or [phone]       Fax: [phone]   RxID:   [id number]     Pneumovax    Vaccine Type: Pneumovax    Site: right deltoid    Mfr: Merck    Dose: 0.5 ml    Route: IM    Given by: Everitt Amber LPN    Exp. Date: 07/29/2011    Lot #: [id number]

## 2010-06-15 NOTE — Medication Information (Signed)
Summary: Tax adviser   Imported By: Lind Guest 08/12/2008 13:06:17  _____________________________________________________________________  External Attachment:    Type:   Image     Comment:   External Document

## 2010-06-15 NOTE — Miscellaneous (Signed)
Summary: refill  Clinical Lists Changes  Medications: Rx of AMLODIPINE BESYLATE 10 MG TABS (AMLODIPINE BESYLATE) one tab by mouth qd;  #90 x 0;  Signed;  Entered by: Worthy Keeler LPN;  Authorized by: Syliva Overman MD;  Method used: [facility] [phone]*, [address], [place], [place], [place]  [address], Ph: [phone] or [phone], Fax: [phone] Rx of BENAZEPRIL HCL 40 MG TABS (BENAZEPRIL HCL) one tab by mouth qd;  #90 x 0;  Signed;  Entered by: Worthy Keeler LPN;  Authorized by: Syliva Overman MD;  Method used: Electronically to [facility]. [phone]*, [address] [place], [place], [place]  [address], Ph: [phone] or [phone], Fax: [phone]    Prescriptions: BENAZEPRIL HCL 40 MG TABS (BENAZEPRIL HCL) one tab by mouth qd  #90 x 0   Entered by:   Worthy Keeler LPN   Authorized by:   Syliva Overman MD   Signed by:   Worthy Keeler LPN on 37/16/9678   Method used:   Electronically to        [facility] [phone]* (retail)       [address]       [place], [place]  [address]       Ph: [phone] or [phone]       Fax: [phone]   RxID:   [id number] AMLODIPINE BESYLATE 10 MG TABS (AMLODIPINE BESYLATE) one tab by mouth qd  #90 x 0   Entered by:   Worthy Keeler LPN   Authorized by:   Syliva Overman MD   Signed by:   Worthy Keeler LPN on 83/38/2505   Method used:   Electronically to        [facility] [phone]* (retail)       [address]       [place], [place]  [address]       Ph: [phone] or [phone]       Fax: [phone]   RxID:   [id number]

## 2010-06-15 NOTE — Letter (Signed)
[facility]   Imported By: Lind Guest 09/10/2008 09:06:25  _____________________________________________________________________  External Attachment:    Type:   Image     Comment:   External Document

## 2010-06-15 NOTE — Assessment & Plan Note (Signed)
Summary: physical   Vital Signs:  Patient profile:   69 year old female Menstrual status:  hysterectomy Height:      65 inches Weight:      183.44 pounds BMI:     30.64 O2 Sat:      95 % Pulse rate:   66 / minute Pulse rhythm:   regular Resp:     16 per minute BP sitting:   140 / 80  (left arm) Cuff size:   large  Vitals Entered By: Everitt Amber (October 22, 2008 2:13 PM)  Nutrition Counseling: Patient's BMI is greater than 25 and therefore counseled on weight management options. CC: CPE,    CC:  CPE and .  History of Present Illness: Patient reports doing well.  Denies any recent fever or chills.  Denies any appetite change or change in bowel movements. Patient denies depression, anxiety or insomnia. the pt is in for her cPE with no specific or new health concerns. She denies polyuria, polydypsia , nlurred vision or hypoglycemic episodes. She denies dysuria , frequency or incontinence.   Current Medications (verified): 1)  Lantus Solostar 100 Unit/ml Soln (Insulin Glargine) .... 40 Units Daily 2)  Janumet 50-1000 Mg Tabs (Sitagliptin-Metformin Hcl) .... Take 1 Tablet By Mouth Two Times A Day 3)  Diovan Hct 320-25 Mg Tabs (Valsartan-Hydrochlorothiazide) .... Take 1 Tablet By Mouth Once A Day 4)  Lotrel 10-40 Mg Caps (Amlodipine Besy-Benazepril Hcl) .... Take 1 Capsule By Mouth Once A Day 5)  Miralax  Powd (Polyethylene Glycol 3350) .Marland Kitchen.. 17gm in 8oz of Water Qd 6)  Vytorin 10-40 Mg Tabs (Ezetimibe-Simvastatin) .... Take 1 Tab By Mouth At Bedtime 7)  Metoprolol Tartrate 25 Mg Tabs (Metoprolol Tartrate) .... Take 1 Tablet By Mouth Two Times A Day 8)  Omeprazole 20 Mg Cpdr (Omeprazole) .... Take 1 Tablet By Mouth Once A Day  Allergies (verified): No Known Drug Allergies  Review of Systems General:  Denies chills and fever. Eyes:  Denies blurring, discharge, and red eye. ENT:  Denies hoarseness, nasal congestion, sinus pressure, and sore throat. CV:  Denies chest pain or  discomfort, difficulty breathing while lying down, palpitations, and swelling of feet. Resp:  Denies cough, sputum productive, and wheezing. GI:  Complains of constipation; denies abdominal pain, diarrhea, nausea, and vomiting; chronic. GU:  See HPI. MS:  Complains of joint pain; denies low back pain, mid back pain, and stiffness. Derm:  Denies insect bite(s), lesion(s), and rash. Neuro:  Denies headaches, poor balance, and seizures. Psych:  Denies anxiety and depression. Endo:  See HPI. Heme:  Denies abnormal bruising and bleeding. Allergy:  Denies itching eyes and sneezing.  Physical Exam  General:  alert, well-hydrated, and overweight-appearing.   Head:  Normocephalic and atraumatic without obvious abnormalities. No apparent alopecia or balding. Eyes:  vision grossly intact, pupils equal, pupils round, and pupils reactive to light.   Ears:  External ear exam shows no significant lesions or deformities.  Otoscopic examination reveals clear canals, tympanic membranes are intact bilaterally without bulging, retraction, inflammation or discharge. Hearing is grossly normal bilaterally. Nose:  no external deformity and no nasal discharge.   Mouth:  pharynx pink and moist and fair dentition.   Neck:  No deformities, masses, or tenderness noted. Chest Wall:  No deformities, masses, or tenderness noted. Breasts:  No mass, nodules, thickening, tenderness, bulging, retraction, inflamation, nipple discharge or skin changes noted.   Lungs:  Normal respiratory effort, chest expands symmetrically. Lungs are clear to auscultation,  no crackles or wheezes. Heart:  Normal rate and regular rhythm. S1 and S2 normal without gallop, murmur, click, rub or other extra sounds. Abdomen:  Bowel sounds positive,abdomen soft and non-tender without masses, organomegaly or hernias noted. Rectal:  No external abnormalities noted. Normal sphincter tone. No rectal masses or tenderness. Genitalia:  normal introitus, no  external lesions, and no adnexal masses or tenderness. Uterus absent.  Msk:  No deformity or scoliosis noted of thoracic or lumbar spine.   Pulses:  R and L carotid,radial,femoral,dorsalis pedis and posterior tibial pulses are full and equal bilaterally Extremities:  No clubbing, cyanosis, edema, or deformity noted with normal full range of motion of all joints.   Neurologic:  No cranial nerve deficits noted. Station and gait are normal. Plantar reflexes are down-going bilaterally. DTRs are symmetrical throughout. Sensory, motor and coordinative functions appear intact.  Diabetes Management Exam:    Foot Exam (with socks and/or shoes not present):       Sensory-Monofilament:          Left foot: diminished          Right foot: diminished       Inspection:          Left foot: normal          Right foot: normal       Nails:          Left foot: normal          Right foot: normal   Impression & Recommendations:  Problem # 1:  PHYSICAL EXAMINATION (ICD-V70.0) guaic neg stool, pap sent.  Problem # 2:  OTHER SYMPTOMS INVOLVING DIGESTIVE SYSTEM OTHER (ICD-787.99) Assessment: Unchanged chronic constipation, pt to start regular stool softeners and laxatives  Problem # 3:  OBESITY, UNSPECIFIED (ICD-278.00) Assessment: Comment Only  Ht: 65 (10/22/2008)   Wt: 183.44 (10/22/2008)   BMI: 30.64 (10/22/2008)  Problem # 4:  HYPERLIPIDEMIA (ICD-272.4) Assessment: Comment Only  The following medications were removed from the medication list:    Vytorin 10-40 Mg Tabs (Ezetimibe-simvastatin) .Marland Kitchen... Take 1 tab by mouth at bedtime Her updated medication list for this problem includes:    Simvastatin 40 Mg Tabs (Simvastatin) ..... One tab by mouth qhs  Labs Reviewed: SGOT: 36 (08/14/2008)   SGPT: 38 (08/14/2008)   HDL:41 (08/14/2008)  LDL:132 (08/14/2008)  Chol:197 (08/14/2008)  Trig:120 (08/14/2008), uncontrolled, pt non compliant with med secondary to cost  Problem # 5:  DIABETES MELLITUS, TYPE  II (ICD-250.00) Assessment: Comment Only  The following medications were removed from the medication list:    Lotrel 10-40 Mg Caps (Amlodipine besy-benazepril hcl) .Marland Kitchen... Take 1 capsule by mouth once a day Her updated medication list for this problem includes:    Lantus Solostar 100 Unit/ml Soln (Insulin glargine) .Marland KitchenMarland KitchenMarland KitchenMarland Kitchen 40 units daily    Janumet 50-1000 Mg Tabs (Sitagliptin-metformin hcl) .Marland Kitchen... Take 1 tablet by mouth two times a day    Diovan Hct 320-25 Mg Tabs (Valsartan-hydrochlorothiazide) .Marland Kitchen... Take 1 tablet by mouth once a day    Benazepril Hcl 40 Mg Tabs (Benazepril hcl) ..... One tab by mouth qd  Orders: Glucose, (CBG) (56433) T-Urine Microalbumin w/creat. ratio [phone] / [id number]) Ophthalmology Referral (Ophthalmology)  Labs Reviewed: Creat: 0.78 (08/14/2008)    Reviewed HgBA1c results: 7.8 (08/06/2008)  Complete Medication List: 1)  Lantus Solostar 100 Unit/ml Soln (Insulin glargine) .... 40 units daily 2)  Janumet 50-1000 Mg Tabs (Sitagliptin-metformin hcl) .... Take 1 tablet by mouth two times a day 3)  Diovan Hct 320-25  Mg Tabs (Valsartan-hydrochlorothiazide) .... Take 1 tablet by mouth once a day 4)  Miralax Powd (Polyethylene glycol 3350) .Marland Kitchen.. 17gm in 8oz of water qd 5)  Metoprolol Tartrate 25 Mg Tabs (Metoprolol tartrate) .... Take 1 tablet by mouth two times a day 6)  Omeprazole 20 Mg Cpdr (Omeprazole) .... Take 1 tablet by mouth once a day 7)  Amlodipine Besylate 10 Mg Tabs (Amlodipine besylate) .... One tab by mouth qd 8)  Benazepril Hcl 40 Mg Tabs (Benazepril hcl) .... One tab by mouth qd 9)  Simvastatin 40 Mg Tabs (Simvastatin) .... One tab by mouth qhs 10)  Colace 100 Mg Caps (Docusate sodium) .... One cap by mouth bid 11)  Dulcolax 5 Mg Tbec (Bisacodyl) .... One tab by mouth every three days prn  Other Orders: Hemoccult Guaiac-1 spec.(in office) (82270) Pap Smear ([address]109)  Patient Instructions: 1)  F/U in 6 weeks 2)  It is important that you exercise  regularly at least 20 minutes 5 times a week. If you develop chest pain, have severe difficulty breathing, or feel very tired , stop exercising immediately and seek medical attention. 3)  You need to lose weight. Consider a lower calorie diet and regular exercise.  4)  pLS increase the lantus to 45 units daily. 5)  Med changes as discussed.Marland Kitchen 6)  We will schedule an appt with dr. Nile Riggs Prescriptions: DULCOLAX 5 MG TBEC (BISACODYL) one tab by mouth every three days prn  #10 x 5   Entered and Authorized by:   Syliva Overman MD   Signed by:   Syliva Overman MD on 10/29/2008   Method used:   Handwritten   RxID:   [id number] COLACE 100 MG CAPS (DOCUSATE SODIUM) one cap by mouth bid  #60 x 5   Entered and Authorized by:   Syliva Overman MD   Signed by:   Syliva Overman MD on 10/29/2008   Method used:   Handwritten   RxID:   [id number] SIMVASTATIN 40 MG TABS (SIMVASTATIN) one tab by mouth qhs  #90 x 3   Entered and Authorized by:   Syliva Overman MD   Signed by:   Syliva Overman MD on 10/29/2008   Method used:   Handwritten   RxID:   [id number] BENAZEPRIL HCL 40 MG TABS (BENAZEPRIL HCL) one tab by mouth qd  #90 x 3   Entered and Authorized by:   Syliva Overman MD   Signed by:   Syliva Overman MD on 10/29/2008   Method used:   Handwritten   RxID:   [id number] AMLODIPINE BESYLATE 10 MG TABS (AMLODIPINE BESYLATE) one tab by mouth qd  #90 x 3   Entered and Authorized by:   Syliva Overman MD   Signed by:   Syliva Overman MD on 10/29/2008   Method used:   Handwritten   RxID:   [id number]   Laboratory Results   Blood Tests   Date/Time Received: October 22, 2008  Date/Time Reported: October 22, 2008 2pm  Glucose (random): 123 mg/dL   (Normal Range: 16-606)  Date/Time Received: October 22, 2008  Date/Time Reported: October 22, 2008   Stool - Occult Blood Hemmoccult #1: negative Date: 10/22/2008 Comments: 51180 11L 8/11 5067 10/11   Laboratory  Results   Blood Tests     Glucose (random): 123 mg/dL   (Normal Range: 30-160)   Comments: 51180 11L 8/11 5067 10/11

## 2010-06-15 NOTE — Consult Note (Signed)
[facility]   Imported By: Lind Guest 09/05/2008 14:54:09  _____________________________________________________________________  External Attachment:    Type:   Image     Comment:   External Document

## 2010-06-15 NOTE — Progress Notes (Signed)
Summary: EYE EXAM  EYE EXAM   Imported By: Lind Guest 11/06/2008 08:57:07  _____________________________________________________________________  External Attachment:    Type:   Image     Comment:   External Document

## 2010-06-15 NOTE — Progress Notes (Signed)
Summary: rx  Phone Note Call from Patient   Summary of Call: needs a rx bd needles for lantus pin. [phone] [place] Initial call taken by: Rudene Anda,  December 25, 2008 3:57 PM  Follow-up for Phone Call        script in your folder pls fax and advise Follow-up by: Syliva Overman MD,  December 25, 2008 5:04 PM  Additional Follow-up for Phone Call Additional follow up Details #1::        Patient aware and rx faxed Additional Follow-up by: Everitt Amber,  December 26, 2008 9:19 AM

## 2010-06-15 NOTE — Progress Notes (Signed)
  Phone Note From Pharmacy   [facility] Summary of Call: pls [facility] the [facility] [place] she is taking tribenzor 40/10/25 one daily, metoprolol and benazepril as befor, tell them to discontinue lotrel, amlodipine and amlodipine benazepril the original paer from them is in your folder pls Initial call taken by: Syliva Overman MD,  March 19, 2009 6:12 PM  Follow-up for Phone Call        Suella Broad aware Follow-up by: Worthy Keeler LPN,  March 20, 2009 10:59 AM

## 2010-06-15 NOTE — Letter (Signed)
Summary: Letter  Letter   Imported By: Lind Guest 01/28/2010 08:50:57  _____________________________________________________________________  External Attachment:    Type:   Image     Comment:   External Document

## 2010-06-15 NOTE — Progress Notes (Signed)
Summary: med  Phone Note Call from Patient   Summary of Call: needs to get junamet 50. 1000. [facility] [phone]  Initial call taken by: Rudene Anda,  January 12, 2009 10:35 AM  Follow-up for Phone Call        no longer on med list, should patient be taking this? Follow-up by: Worthy Keeler LPN,  January 12, 2009 10:53 AM  Additional Follow-up for Phone Call Additional follow up Details #1::        contacted pt wantsto stay on the janumet tho expensive, rx sent in and pt aware Additional Follow-up by: Syliva Overman MD,  January 12, 2009 12:33 PM    New/Updated Medications: JANUMET 50-1000 MG TABS (SITAGLIPTIN-METFORMIN HCL) .Take 1 tablet by mouth two times a day Prescriptions: JANUMET 50-1000 MG TABS (SITAGLIPTIN-METFORMIN HCL) .Take 1 tablet by mouth two times a day  #60 x 5   Entered and Authorized by:   Syliva Overman MD   Signed by:   Syliva Overman MD on 01/12/2009   Method used:   [facility] to        [facility] [phone]* (retail)       [address]       [place], [place]  [address]       Ph: [phone] or [phone]       Fax: [phone]   RxID:   [id number]

## 2010-06-15 NOTE — Letter (Signed)
Summary: EGD WITH [facility] ORDER  [facility] WITH [facility]   Imported By: Ave Filter 11/13/2008 16:16:17  _____________________________________________________________________  External Attachment:    Type:   Image     Comment:   External Document

## 2010-06-15 NOTE — Progress Notes (Signed)
Summary: EYE EXAM  EYE EXAM   Imported By: Lind Guest 10/22/2009 13:39:08  _____________________________________________________________________  External Attachment:    Type:   Image     Comment:   External Document

## 2010-06-15 NOTE — Assessment & Plan Note (Signed)
Summary: office visit   Vital Signs:  Patient profile:   70 year old female Menstrual status:  hysterectomy Height:      65 inches (165.10 cm) Weight:      180 pounds (81.82 kg) BMI:     30.06 O2 Sat:      97 % Pulse rate:   65 / minute Resp:     16 per minute BP sitting:   190 / 90  (left arm) Cuff size:   large  Vitals Entered By: Everitt Amber (Sep 17, 2008 3:41 PM)  Nutrition Counseling: Patient's BMI is greater than 25 and therefore counseled on weight management options. CC: Follow up chronic problems, has a pain in right side that feel slike something is sticking in her ribs Is Patient Diabetic? Yes   CC:  Follow up chronic problems and has a pain in right side that feel slike something is sticking in her ribs.  History of Present Illness: The pt reports recurrent RUQ pain with bloating, belching and flatulence that has progressively worsened. She also reports a change in the character of her stool stating that it is stringy and pebbly in the recent times. She denies head or chest congestion. She denies polyuria, polydypsia or hypoglycemic eopisodes, she has been making a more concerted attempt to improve her blood sugars by dietary modification.   Current Medications (verified): 1)  Lantus Solostar 100 Unit/ml Soln (Insulin Glargine) .... 40 Units Daily 2)  Janumet 50-1000 Mg Tabs (Sitagliptin-Metformin Hcl) .... Take 1 Tablet By Mouth Two Times A Day 3)  Diovan Hct 320-25 Mg Tabs (Valsartan-Hydrochlorothiazide) .... Take 1 Tablet By Mouth Once A Day 4)  Lotrel 10-40 Mg Caps (Amlodipine Besy-Benazepril Hcl) .... Take 1 Capsule By Mouth Once A Day 5)  Miralax  Powd (Polyethylene Glycol 3350) .Marland Kitchen.. 17gm in 8oz of Water Qd 6)  Vytorin 10-40 Mg Tabs (Ezetimibe-Simvastatin) .... Take 1 Tab By Mouth At Bedtime 7)  Metoprolol Tartrate 25 Mg Tabs (Metoprolol Tartrate) .... Take 1 Tablet By Mouth Two Times A Day  Allergies (verified): No Known Drug Allergies  Review of  Systems General:  Denies chills and fever. ENT:  Denies earache, hoarseness, nasal congestion, sinus pressure, and sore throat. CV:  Denies chest pain or discomfort, palpitations, and swelling of feet. Resp:  Denies cough, shortness of breath, sputum productive, and wheezing. GI:  Complains of abdominal pain; right sided abd pain x 6 months, worsening, boating and belching, change in bowel movements with striingy stool. GU:  Denies dysuria and urinary frequency. Psych:  Denies anxiety and depression. Heme:  Denies abnormal bruising and bleeding. Allergy:  Complains of seasonal allergies.   Impression & Recommendations:  Problem # 1:  ABDOMINAL PAIN OTHER SPECIFIED SITE (ICD-789.09) Assessment Comment Only  Orders: TLB-H. Pylori Abs(Helicobacter Pylori) ([address]HEL[address]) Gastroenterology Referral (GI) Radiology Referral (Radiology)  Problem # 2:  HYPERLIPIDEMIA (ICD-272.4) Assessment: Unchanged  Her updated medication list for this problem includes:    Vytorin 10-40 Mg Tabs (Ezetimibe-simvastatin) .Marland Kitchen... Take 1 tab by mouth at bedtime  Labs Reviewed: SGOT: 36 (08/14/2008)   SGPT: 38 (08/14/2008)   HDL:41 (08/14/2008)  LDL:132 (08/14/2008)  Chol:197 (08/14/2008)  Trig:120 (08/14/2008)  Problem # 3:  DIABETES MELLITUS, TYPE II (ICD-250.00) Assessment: Comment Only  Her updated medication list for this problem includes:    Lantus Solostar 100 Unit/ml Soln (Insulin glargine) .Marland KitchenMarland KitchenMarland KitchenMarland Kitchen 40 units daily    Janumet 50-1000 Mg Tabs (Sitagliptin-metformin hcl) .Marland Kitchen... Take 1 tablet by mouth two times a day  Diovan Hct 320-25 Mg Tabs (Valsartan-hydrochlorothiazide) .Marland Kitchen... Take 1 tablet by mouth once a day    Lotrel 10-40 Mg Caps (Amlodipine besy-benazepril hcl) .Marland Kitchen... Take 1 capsule by mouth once a day  Orders: Glucose, (CBG) [id number])  Labs Reviewed: Creat: 0.78 (08/14/2008)    Reviewed HgBA1c results: 7.8 (08/06/2008)  Problem # 4:  HYPERTENSION (ICD-401.9) Assessment: Deteriorated  Her  updated medication list for this problem includes:    Diovan Hct 320-25 Mg Tabs (Valsartan-hydrochlorothiazide) .Marland Kitchen... Take 1 tablet by mouth once a day    Lotrel 10-40 Mg Caps (Amlodipine besy-benazepril hcl) .Marland Kitchen... Take 1 capsule by mouth once a day    Metoprolol Tartrate 25 Mg Tabs (Metoprolol tartrate) .Marland Kitchen... Take 1 tablet by mouth two times a day pt is out of her meds, states she will fill today , denies being unable to afford the meds BP today: 190/90 Prior BP: 168/92 (08/06/2008)  Labs Reviewed: K+: 3.9 (08/14/2008) Creat: : 0.78 (08/14/2008)   Chol: 197 (08/14/2008)   HDL: 41 (08/14/2008)   LDL: 132 (08/14/2008)   TG: 120 (08/14/2008)  Problem # 5:  OBESITY, UNSPECIFIED (ICD-278.00) Assessment: Improved  Ht: 65 (09/17/2008)   Wt: 180 (09/17/2008)   BMI: 30.06 (09/17/2008)  Complete Medication List: 1)  Lantus Solostar 100 Unit/ml Soln (Insulin glargine) .... 40 units daily 2)  Janumet 50-1000 Mg Tabs (Sitagliptin-metformin hcl) .... Take 1 tablet by mouth two times a day 3)  Diovan Hct 320-25 Mg Tabs (Valsartan-hydrochlorothiazide) .... Take 1 tablet by mouth once a day 4)  Lotrel 10-40 Mg Caps (Amlodipine besy-benazepril hcl) .... Take 1 capsule by mouth once a day 5)  Miralax Powd (Polyethylene glycol 3350) .Marland Kitchen.. 17gm in 8oz of water qd 6)  Vytorin 10-40 Mg Tabs (Ezetimibe-simvastatin) .... Take 1 tab by mouth at bedtime 7)  Metoprolol Tartrate 25 Mg Tabs (Metoprolol tartrate) .... Take 1 tablet by mouth two times a day 8)  Omeprazole 20 Mg Cpdr (Omeprazole) .... Take 1 tablet by mouth once a day  Patient Instructions: 1)  CPE in 3 weeks o 4 weeks, morning appt. or early afternoon. 2)  your BP is too high, pls get your meds today. 3)  For the abdominal pain, we will check your blood to see if you have an infection, also you will be referred for studies on your gall bladder as well as to a GI doc .Medication is sent in also, pls do not fill till we call with the blood test  results Prescriptions: OMEPRAZOLE 20 MG CPDR (OMEPRAZOLE) Take 1 tablet by mouth once a day  #30 x 3   Entered and Authorized by:   Syliva Overman MD   Signed by:   Syliva Overman MD on 09/17/2008   Method used:   [facility] to        [facility] [phone]* (retail)       [address]       [place], [place]  [address]       Ph: [phone] or [phone]       Fax: [phone]   RxID:   [id number]   Laboratory Results   Blood Tests   Date/Time Received: Sep 17, 2008 4pm Date/Time Reported: Sep 17, 2008 4pm  Glucose (random): 113 mg/dL   (Normal Range: 03-474)

## 2010-06-15 NOTE — Progress Notes (Signed)
[facility]   Imported By: Lind Guest 02/03/2009 13:17:38  _____________________________________________________________________  External Attachment:    Type:   Image     Comment:   External Document

## 2010-06-15 NOTE — Progress Notes (Signed)
Summary: med  Phone Note Call from Patient   Summary of Call: pt needs to speak with nurse about one of meds not working. [phone] Initial call taken by: Rudene Anda,  Oct 13, 2009 10:28 AM  Follow-up for Phone Call        patient does not want to take tribenzor, states it makes her tired and sleepy, states she is also noticing light spots on her skin Follow-up by: Adella Hare LPN,  Oct 13, 2009 1:31 PM  Additional Follow-up for Phone Call Additional follow up Details #1::        I dont think that this is what is causing her skin to have light spots.  Ask pt if she could continue this one more week & sched an appt with Dr Lodema Hong for next week. Additional Follow-up by: Esperanza Sheets PA,  Oct 13, 2009 2:22 PM    Additional Follow-up for Phone Call Additional follow up Details #2::    patient aware and agrees Follow-up by: Adella Hare LPN,  Oct 13, 2009 4:05 PM

## 2010-06-15 NOTE — Assessment & Plan Note (Signed)
Summary: BP CHECK  Nurse Visit   Vital Signs:  Patient profile:   70 year old female Menstrual status:  hysterectomy Weight:      181.12 pounds O2 Sat:      96 % on Room air Pulse rate:   70 / minute Resp:     16 per minute BP sitting:   152 / 82  O2 Flow:  Room air  Allergies: No Known Drug Allergies Prescriptions: ONGLYZA 5 MG TABS (SAXAGLIPTIN HCL) Take 1 tablet by mouth once a day  #30 x 3   Entered and Authorized by:   Syliva Overman MD   Signed by:   Syliva Overman MD on 04/08/2009   Method used:   Electronically to        [facility] [phone]* (retail)       [address]       [place], [place]  [address]       Ph: [phone] or [phone]       Fax: [phone]   RxID:   [id number] METFORMIN HCL 1000 MG TABS (METFORMIN HCL) Take 1 tablet by mouth two times a day  #180 x 3   Entered and Authorized by:   Syliva Overman MD   Signed by:   Syliva Overman MD on 04/08/2009   Method used:   Electronically to        [facility] [phone]* (retail)       [address]       [place], [place]  [address]       Ph: [phone] or [phone]       Fax: [phone]   RxID:   [id number] METOPROLOL TARTRATE 50 MG TABS (METOPROLOL TARTRATE) Take 1 tablet by mouth two times a day  #180 x 3   Entered and Authorized by:   Syliva Overman MD   Signed by:   Syliva Overman MD on 04/08/2009   Method used:   Electronically to        [facility] [phone]* (retail)       [address]       [place], [place]  [address]       Ph: [phone] or [phone]       Fax: [phone]   RxID:   [id number]  bP rechecked, I agreed ,metoprolo dose was increased, pt alsorequested changing from janumet because of expense thios was done

## 2010-06-15 NOTE — Assessment & Plan Note (Signed)
Summary: NEW PATIENT   Vital Signs:  Patient profile:   70 year old female Menstrual status:  hysterectomy Height:      65 inches (165.10 cm) Weight:      187.25 pounds (85.11 kg) BMI:     31.27 BSA:     1.93 O2 Sat:      98 % Pulse rate:   74 / minute Resp:     16 per minute BP sitting:   168 / 92  (left arm)  Vitals Entered By: Everitt Amber (August 06, 2008 1:50 PM)  Nutrition Counseling: Patient's BMI is greater than 25 and therefore counseled on weight management options. Is Patient Diabetic? Yes   years   days  Menstrual Status hysterectomy   History of Present Illness: the pt is here to establish care. She dnies any recent fever orchills, she denies depression, anxiety or insomnia. She denies polyuria,polydypsia, polyuria or hypoglycemic. she denies significant joint pain or stiffness.She denies any skin lesions.   Preventive Screening-Counseling & Management     Smoking Status: never     Does Patient Exercise: yes      Drug Use:  no.    Allergies (verified): No Known Drug Allergies  Past History:  Past Medical History:    Hypertension x 20 years    Diabetes x 25 years    Hyperlipidemia x 10 years    obesity  lifelong  Past Surgical History:    Hysterectomy- 1994, for fibroids    Vaginal prolapse    Ovarian tumors removed    cataract surgery  on both eyes, 1998 and 2000  Family History:    Mother-deceased-stomach cancer, heart attack 60's    Father-deceased-heart attack 50's    1 sister-living-multiple myleoma     3 sisters-hypertension    1 sister deceased at 60    brother died at age 20 mVA  Social History:    Retired in 1999, was a Scientist, product/process development    Married x 32 yrs    Never Smoked    Alcohol use-no    Drug use-no    Regular exercise-yes    one son hTN    Smoking Status:  never    Drug Use:  no    Does Patient Exercise:  yes  Review of Systems General:  Complains of fatigue; denies chills and fever. Eyes:  Denies discharge and red  eye. ENT:  Denies hoarseness, nasal congestion, sinus pressure, and sore throat. CV:  Denies chest pain or discomfort, palpitations, and swelling of hands. Resp:  Denies cough, sputum productive, and wheezing. GI:  Denies abdominal pain, constipation, diarrhea, nausea, and vomiting. GU:  Denies dysuria and urinary frequency. MS:  Denies joint pain and stiffness. Derm:  Denies lesion(s) and rash. Neuro:  Denies headaches, poor balance, and seizures. Psych:  Denies anxiety and depression. Endo:  Denies excessive thirst and excessive urination. Heme:  Denies abnormal bruising and bleeding. Allergy:  Denies hives or rash and itching eyes.  Physical Exam  General:  alert, well-hydrated, and overweight-appearing. HEENT: No facial asymmetry,  EOMI, No sinus tenderness, TM's Clear, oropharynx  pink and moist.   Chest: Clear to auscultation bilaterally.  CVS: S1, S2, No murmurs, No S3.   Abd: Soft, Nontender.  MS: Adequate ROM spine, hips, shoulders and knees.  Ext: No edema.   CNS: CN 2-12 intact, power tone and sensation normal throughout.   Skin: Intact, no visible lesions or rashes.  Psych: Good eye contact, normal affect.  Memory intact,  not anxious or depressed appearing.      Impression & Recommendations:  Problem # 1:  OBESITY, UNSPECIFIED (ICD-278.00) Assessment Comment Only  Ht: 65 (08/06/2008)   Wt: 187.25 (08/06/2008)   BMI: 31.27 (08/06/2008)  Problem # 2:  HYPERLIPIDEMIA (ICD-272.4) Assessment: Comment Only  Her updated medication list for this problem includes:    Simvastatin 20 Mg Tabs (Simvastatin) .Marland Kitchen... Take 1 tablet by mouth once a day  Orders: T-Lipid Profile ([id number]) T-Hepatic Function [phone]) Cardiology Referral (Cardiology)  Problem # 3:  DIABETES MELLITUS, TYPE II (ICD-250.00) Assessment: Comment Only  The following medications were removed from the medication list:    Amlodipine Besy-benazepril Hcl 10-20 Mg Caps (Amlodipine  besy-benazepril hcl) .Marland Kitchen... Take 1 tablet by mouth once a day Her updated medication list for this problem includes:    Lantus Solostar 100 Unit/ml Soln (Insulin glargine) .Marland KitchenMarland KitchenMarland KitchenMarland Kitchen 40 units daily    Janumet 50-1000 Mg Tabs (Sitagliptin-metformin hcl) .Marland Kitchen... Take 1 tablet by mouth two times a day    Diovan Hct 320-25 Mg Tabs (Valsartan-hydrochlorothiazide) .Marland Kitchen... Take 1 tablet by mouth once a day    Lotrel 10-40 Mg Caps (Amlodipine besy-benazepril hcl) .Marland Kitchen... Take 1 capsule by mouth once a day  Orders: Cardiology Referral (Cardiology) EKG w/ Interpretation (93000)abnormal ekg, old infarct, multiple cardiac risk factors  Reviewed HgBA1c results: 7.8 (08/06/2008)  Problem # 4:  HYPERTENSION (ICD-401.9) Assessment: Comment Only  The following medications were removed from the medication list:    Amlodipine Besy-benazepril Hcl 10-20 Mg Caps (Amlodipine besy-benazepril hcl) .Marland Kitchen... Take 1 tablet by mouth once a day Her updated medication list for this problem includes:    Diovan Hct 320-25 Mg Tabs (Valsartan-hydrochlorothiazide) .Marland Kitchen... Take 1 tablet by mouth once a day    Lotrel 10-40 Mg Caps (Amlodipine besy-benazepril hcl) .Marland Kitchen... Take 1 capsule by mouth once a day  Orders: T-Basic Metabolic Panel [phone]) Cardiology Referral (Cardiology) EKG w/ Interpretation (93000)  BP today: 168/92  Complete Medication List: 1)  Simvastatin 20 Mg Tabs (Simvastatin) .... Take 1 tablet by mouth once a day 2)  Lantus Solostar 100 Unit/ml Soln (Insulin glargine) .... 40 units daily 3)  Janumet 50-1000 Mg Tabs (Sitagliptin-metformin hcl) .... Take 1 tablet by mouth two times a day 4)  Diovan Hct 320-25 Mg Tabs (Valsartan-hydrochlorothiazide) .... Take 1 tablet by mouth once a day 5)  Lotrel 10-40 Mg Caps (Amlodipine besy-benazepril hcl) .... Take 1 capsule by mouth once a day 6)  Miralax Powd (Polyethylene glycol 3350) .Marland Kitchen.. 17gm in 8oz of water qd  Other Orders: T-CBC w/Diff ([id number]) T-TSH  [phone])  Patient Instructions: 1)  F/u in 4 to 5 weeks. 2)  It is important that you exercise regularly at least 40 minutes 6 times a week. If you develop chest pain, have severe difficulty breathing, or feel very tired , stop exercising immediately and seek medical attention. 3)  You need to lose weight. Consider a lower calorie diet and regular exercise.  4)  Check your blood sugars regularly. If your readings are usually above : or below 70 you should contact our office. 5)  CCheck and record fasting blood sugars and inc the lantus dose as needed, pls start using 20 units tonight. 6)  The dose of lotrel is increased o 10/40 1 daily, your bP is high. 7)  You will be referred for cardiology eval because of your risk factors. 8)  pls get fasting labs asap 9)  BMP prior to visit, ICD-9: 10)  Hepatic Panel prior to  visit, ICD-9: 11)  Lipid Panel prior to visit, ICD-9:   fasting asap, copy to S/E cardiology 12)  TSH prior to visit, ICD-9: 13)  CBC w/ Diff prior to visit, ICD-9: Prescriptions: MIRALAX  POWD (POLYETHYLENE GLYCOL 3350) 17gm in 8oz of water qd  #510gm x 6   Entered by:   Worthy Keeler LPN   Authorized by:   Syliva Overman MD   Signed by:   Worthy Keeler LPN on 84/16/6063   Method used:   Electron[facility] to        [facility] [address] [phone]* (retail)       [address]       [place], [place]  [address]       Ph: [phone] or [phone]       Fax: [phone]   RxID:   [id number] LOTREL 10-40 MG CAPS (AMLODIPINE BESY-BENAZEPRIL HCL) Take 1 capsule by mouth once a day  #30 x 4   Entered and Authorized by:   Syliva Overman MD   Signed by:   Syliva Overman MD on 08/06/2008   Method used:   [facility]        [facility] [address] [phone]* (retail)       [address]       [place], [place]  [address]       Ph: [phone] or [phone]       Fax: [phone]   RxID:   [id number] DIOVAN HCT 320-25 MG TABS  (VALSARTAN-HYDROCHLOROTHIAZIDE) Take 1 tablet by mouth once a day  #30 x 4   Entered and Authorized by:   Syliva Overman MD   Signed by:   Syliva Overman MD on 08/06/2008   Method used:   [facility] to        [facility] [phone]* (retail)       [address]       [place], [place]  [address]       Ph: [phone] or [phone]       Fax: [phone]   RxID:   [id number] JANUMET 50-1000 MG TABS (SITAGLIPTIN-METFORMIN HCL) Take 1 tablet by mouth two times a day  #60 x 3   Entered and Authorized by:   Syliva Overman MD   Signed by:   Syliva Overman MD on 08/06/2008   Method used:   [facility] to        [facility] [phone]* (retail)       [address]       [place], [place]  [address]       Ph: [phone] or [phone]       Fax: [phone]   RxID:   [id number] LANTUS SOLOSTAR 100 UNIT/ML SOLN (INSULIN GLARGINE) 40 units daily  #1200 units x 3   Entered and Authorized by:   Syliva Overman MD   Signed by:   Syliva Overman MD on 08/06/2008   Method used:   [facility]ically to        [facility]. [phone]* (retail)       [address]       [place], [place]  [address]       Ph: [phone] or [phone]       Fax: [phone]   RxID:   [id number]  Laboratory Results   Blood Tests   Date/Time Received: August 06, 2008  Date/Time Reported: August 06, 2008 3pm  Glucose (random): 103 mg/dL   (Normal Range: 45-409) HGBA1C: 7.8%   (Normal Range: Non-Diabetic - 3-6%   Control Diabetic - 6-8%)

## 2010-06-15 NOTE — Assessment & Plan Note (Signed)
Summary: BP CK  Nurse Visit   Vital Signs:  Patient profile:   70 year old female Menstrual status:  hysterectomy Height:      65 inches Weight:      183 pounds BMI:     30.56 O2 Sat:      98 % Pulse rate:   70 / minute Resp:     16 per minute BP sitting:   140 / 82 Cuff size:   regular  Vitals Entered By: Everitt Amber (May 06, 2009 10:07 AM) Comments BP check    Allergies: 1)  * Onglyza  Orders Added: 1)  No Charge Patient Arrived (NCPA0) [NCPA0]

## 2010-06-15 NOTE — Assessment & Plan Note (Signed)
Summary: office visit   Vital Signs:  Patient profile:   70 year old female Menstrual status:  hysterectomy Height:      65 inches Weight:      180.50 pounds BMI:     30.15 O2 Sat:      97 % Pulse rate:   58 / minute Pulse rhythm:   regular Resp:     16 per minute BP sitting:   160 / 80  (left arm) Cuff size:   large  Vitals Entered By: Everitt Amber (December 03, 2008 9:57 AM)  Nutrition Counseling: Patient's BMI is greater than 25 and therefore counseled on weight management options. CC: Follow up chronic problems Is Patient Diabetic? Yes   Primary Care Provider:  Lodema Hong, M.D.  CC:  Follow up chronic problems.  History of Present Illness: Patient reports doing well.  Denies any recent fever or chills.  Denies any appetite change or change in bowel movements. Patient denies depression, anxiety or insomnia. The pt has many concerns about the cost of her medication and is interested in changing as much as she possibly can to generic meds. at the same time she states that she really wants her chronic illnesses controlled, and would rather stay on what works, but she is just unable to affiord her meds. A referral for help thru the health dept is warranted. Shedenies polyuria , polydypsia or blurred vision, she denies hypoglycemic episodes.  Allergies: No Known Drug Allergies  Review of Systems General:  Denies chills and fever. Eyes:  Denies blurring, discharge, and red eye. ENT:  Denies hoarseness, nasal congestion, and sinus pressure. CV:  Denies chest pain or discomfort, difficulty breathing while lying down, palpitations, and swelling of feet. Resp:  Denies cough, sputum productive, and wheezing. GI:  Denies constipation, diarrhea, nausea, and vomiting. GU:  Denies dysuria, incontinence, and urinary frequency. MS:  Complains of joint pain; denies low back pain, mid back pain, and stiffness. Derm:  Denies itching, lesion(s), and rash. Neuro:  Denies headaches, numbness,  poor balance, seizures, and sensation of room spinning. Psych:  Denies anxiety, depression, suicidal thoughts/plans, thoughts of violence, and unusual visions or sounds. Endo:  Denies cold intolerance, excessive hunger, excessive thirst, excessive urination, heat intolerance, polyuria, and weight change. Heme:  Denies abnormal bruising and bleeding. Allergy:  Complains of seasonal allergies; denies hives or rash and itching eyes.  Physical Exam  General:  alert, well-hydrated, and overweight-appearing. HEENT: No facial asymmetry,  EOMI, No sinus tenderness, TM's Clear, oropharynx  pink and moist.   Chest: Clear to auscultation bilaterally.  CVS: S1, S2, No murmurs, No S3.   Abd: Soft, Nontender.  MS: Adequate ROM spine, hips, shoulders and knees.  Ext: No edema.   CNS: CN 2-12 intact, power tone and sensation normal throughout.   Skin: Intact, no visible lesions or rashes.  Psych: Good eye contact, normal affect.  Memory intact, not anxious or depressed appearing.         Impression & Recommendations:  Problem # 1:  DIABETES MELLITUS, TYPE II (ICD-250.00) Assessment Improved  The following medications were removed from the medication list:    Lantus Solostar 100 Unit/ml Soln (Insulin glargine) .Marland KitchenMarland KitchenMarland KitchenMarland Kitchen 40 units daily    Janumet 50-1000 Mg Tabs (Sitagliptin-metformin hcl) .Marland Kitchen... Take 1 tablet by mouth two times a day Her updated medication list for this problem includes:    Diovan Hct 320-25 Mg Tabs (Valsartan-hydrochlorothiazide) .Marland Kitchen... Take 1 tablet by mouth once a day    Benazepril Hcl 40  Mg Tabs (Benazepril hcl) ..... One tab by mouth qd    Metformin Hcl 1000 Mg Tabs (Metformin hcl) .Marland Kitchen... Take 1 tablet by mouth two times a day    Lantus Solostar 100 Unit/ml Soln (Insulin glargine) .Marland KitchenMarland KitchenMarland KitchenMarland Kitchen 40 units daily  Orders: Glucose, (CBG) (82962) Hemoglobin A1C (83036)  Labs Reviewed: Creat: 0.78 (08/14/2008)    Reviewed HgBA1c results: 6.8 (12/03/2008)  7.8 (08/06/2008)  Problem # 2:   OBESITY, UNSPECIFIED (ICD-278.00) Assessment: Unchanged  Ht: 65 (12/03/2008)   Wt: 180.50 (12/03/2008)   BMI: 30.15 (12/03/2008)  Problem # 3:  HYPERTENSION (ICD-401.9) Assessment: Deteriorated  Her updated medication list for this problem includes:    Diovan Hct 320-25 Mg Tabs (Valsartan-hydrochlorothiazide) .Marland Kitchen... Take 1 tablet by mouth once a day    Metoprolol Tartrate 25 Mg Tabs (Metoprolol tartrate) .Marland Kitchen... Take 1 tablet by mouth two times a day    Amlodipine Besylate 10 Mg Tabs (Amlodipine besylate) ..... One tab by mouth qd    Benazepril Hcl 40 Mg Tabs (Benazepril hcl) ..... One tab by mouth qd  Orders: T-Basic Metabolic Panel [phone])  BP today: 160/80 Prior BP: 120/78 (11/13/2008)  Labs Reviewed: K+: 3.9 (08/14/2008) Creat: : 0.78 (08/14/2008)   Chol: 197 (08/14/2008)   HDL: 41 (08/14/2008)   LDL: 132 (08/14/2008)   TG: 120 (08/14/2008)  Problem # 4:  HYPERLIPIDEMIA (ICD-272.4) Assessment: Comment Only  Her updated medication list for this problem includes:    Simvastatin 40 Mg Tabs (Simvastatin) ..... One tab by mouth qhs  Orders: T-Hepatic Function [id number]) T-Lipid Profile [phone])  Labs Reviewed: SGOT: 36 (08/14/2008)   SGPT: 38 (08/14/2008)   HDL:41 (08/14/2008)  LDL:132 (08/14/2008)  Chol:197 (08/14/2008)  Trig:120 (08/14/2008)  Complete Medication List: 1)  Diovan Hct 320-25 Mg Tabs (Valsartan-hydrochlorothiazide) .... Take 1 tablet by mouth once a day 2)  Metoprolol Tartrate 25 Mg Tabs (Metoprolol tartrate) .... Take 1 tablet by mouth two times a day 3)  Omeprazole 20 Mg Cpdr (Omeprazole) .... Take 1 tablet by mouth 30 minutes prior to breakfast 4)  Amlodipine Besylate 10 Mg Tabs (Amlodipine besylate) .... One tab by mouth qd 5)  Benazepril Hcl 40 Mg Tabs (Benazepril hcl) .... One tab by mouth qd 6)  Simvastatin 40 Mg Tabs (Simvastatin) .... One tab by mouth qhs 7)  Colace 100 Mg Caps (Docusate sodium) .... One cap by mouth bid 8)  Dulcolax 5  Mg Tbec (Bisacodyl) .Marland Kitchen.. 1 tab by mouth bid 9)  Metformin Hcl 1000 Mg Tabs (Metformin hcl) .... Take 1 tablet by mouth two times a day 10)  Lantus Solostar 100 Unit/ml Soln (Insulin glargine) .... 40 units daily  Patient Instructions: 1)  Please schedule a follow-up appointment in 3 months. 2)  It is important that you exercise regularly at least 20 minutes 5 times a week. If you develop chest pain, have severe difficulty breathing, or feel very tired , stop exercising immediately and seek medical attention. 3)  You need to lose weight. Consider a lower calorie diet and regular exercise. 4)  BMP prior to visit, ICD-9: 5)  Hepatic Panel prior to visit, ICD-9:   fasting in 3 months 6)  Lipid Panel prior to visit, ICD-9: 7)  once you finish the janumet you can change to metformin only and start lantus at 22 units titrate up every 3 days as discussed. 8)  you need to resume the diovan Prescriptions: DIOVAN HCT 320-25 MG TABS (VALSARTAN-HYDROCHLOROTHIAZIDE) Take 1 tablet by mouth once a day  #30 x 6  Entered by:   Everitt Amber   Authorized by:   Syliva Overman MD   Signed by:   Everitt Amber on 12/09/2008   Method used:   Electronically to        [facility] [phone]* (retail)       [address]       [place], [place]  [address]       Ph: [phone] or [phone]       Fax: [phone]   RxID:   [id number] LANTUS SOLOSTAR 100 UNIT/ML SOLN (INSULIN GLARGINE) 40 units daily  #1200 units x 5   Entered and Authorized by:   Syliva Overman MD   Signed by:   Syliva Overman MD on 12/03/2008   Method used:   Electronically to        [facility] [phone]* (retail)       [address]       [place], [place]  [address]       Ph: [phone] or [phone]       Fax: [phone]   RxID:   [id number] METFORMIN HCL 1000 MG TABS (METFORMIN HCL) Take 1 tablet by mouth two times a day  #180 x 3   Entered and Authorized by:   Syliva Overman MD    Signed by:   Syliva Overman MD on 12/03/2008   Method used:   Electronically to        [facility]. [phone]* (retail)       [address]       [place], [place]  [address]       Ph: [phone] or [phone]       Fax: [phone]   RxID:   [id number]   Laboratory Results   Blood Tests   Date/Time Received: December 03, 2008 9am Date/Time Reported: December 03, 2008 9am  Glucose (random): 165 mg/dL   (Normal Range: 78-938) HGBA1C: 6.8%   (Normal Range: Non-Diabetic - 3-6%   Control Diabetic - 6-8%)

## 2010-06-15 NOTE — Letter (Signed)
Summary: Recall Office Visit  [facility]  [address]   [place], [place] [address]   Phone: [phone]  Fax: [phone]      November 27, 2009   [facility] [place] [address] [place], [place]  [address] 10/31/1940   Dear Ms. Skeet,   According to our records, it is time for you to schedule a follow-up office visit with [place].   At your convenience, please call [phone] to schedule an office visit. If you have any questions, concerns, or feel that this letter is in error, we would appreciate your call.   Sincerely,    Hendricks Limes LPN  [facility] Ph: [phone]   Fax: [phone]

## 2010-06-15 NOTE — Consult Note (Signed)
Summary: adult echocardiography report  adult echocardiography report   Imported By: Lind Guest 09/05/2008 14:55:35  _____________________________________________________________________  External Attachment:    Type:   Image     Comment:   External Document

## 2010-06-17 NOTE — Letter (Signed)
Summary: NEW DOSE  NEW DOSE   Imported By: Lind Guest 06/02/2010 14:11:25  _____________________________________________________________________  External Attachment:    Type:   Image     Comment:   External Document

## 2010-06-23 NOTE — Assessment & Plan Note (Signed)
Summary: office visit   Vital Signs:  Patient profile:   70 year old female Menstrual status:  hysterectomy Height:      65 inches Weight:      178.75 pounds BMI:     29.85 O2 Sat:      93 % Pulse rate:   87 / minute Pulse rhythm:   regular Resp:     16 per minute BP sitting:   150 / 90  (left arm) Cuff size:   regular  Vitals Entered By: Everitt Amber LPN (June 02, 2010 9:45 AM)  Nutrition Counseling: Patient's BMI is greater than 25 and therefore counseled on weight management options. CC: Follow up chronic problems   Primary Care Provider:  Lodema Hong, M.D.  CC:  Follow up chronic problems.  History of Present Illness: Reports  that she has had the stress of burying a grandchild , and her son recently had cardiac surgery. Denies recent fever or chills. Denies sinus pressure, nasal congestion , ear pain or sore throat. Denies chest congestion, or cough productive of sputum. Denies chest pain, palpitations, PND, orthopnea or leg swelling. Denies abdominal pain, nausea, vomitting, diarrhea or constipation. Denies change in bowel movements or bloody stool. Denies dysuria , frequency, incontinence or hesitancy. Denies  joint pain, swelling, or reduced mobility. Denies headaches, vertigo, seizures. Denies depression, anxiety or insomnia. Denies  rash, lesions, or itch.     Current Medications (verified): 1)  Benazepril Hcl 40 Mg Tabs (Benazepril Hcl) .... One Tab By Mouth Qd 2)  Dulcolax 5 Mg Tbec (Bisacodyl) .Marland Kitchen.. 1 Tab By Mouth Two Times A Day As Needed 3)  Metoprolol Tartrate 50 Mg Tabs (Metoprolol Tartrate) .... Take 1 Tablet By Mouth Two Times A Day 4)  Metformin Hcl 1000 Mg Tabs (Metformin Hcl) .... Take 1 Tablet By Mouth Two Times A Day 5)  Lantus Solostar 100 Unit/ml Soln (Insulin Glargine) .... 40 Units Daily 6)  Aspir-Low 81 Mg Tbec (Aspirin) .... Take 1 Tablet By Mouth Once A Day 7)  Lovastatin 40 Mg Tabs (Lovastatin) .... Take 1 Tab By Mouth At Bedtime 8)   Amlodipine Besylate 10 Mg Tabs (Amlodipine Besylate) .... Take 1 Tablet By Mouth Once A Day 9)  Clonidine Hcl 0.2 Mg Tabs (Clonidine Hcl) .... Take 1 Tab By Mouth At Bedtime 10)  Travatan Z 0.004 % Soln (Travoprost) .... One Drop in Each Eye At Bedtime 11)  Bd Pen Needle Short U/f 31g X 8 Mm Misc (Insulin Pen Needle) .... Once Daily Use With Lantus 12)  Maxzide-25 37.5-25 Mg Tabs (Triamterene-Hctz) .... One and A Half Tablets Once Daily 13)  Coq10 50 Mg Caps (Coenzyme Q10) .... 2 A Day  Allergies (verified): 1)  * Onglyza  Review of Systems      See HPI General:  Complains of fatigue. Eyes:  Denies discharge and red eye. Endo:  Denies excessive hunger and excessive thirst; fastings average  127 to 180, bedtimes avg 178. Heme:  Denies abnormal bruising and bleeding. Allergy:  Denies hives or rash and itching eyes.  Physical Exam  General:  Well-developed,well-nourished,in no acute distress; alert,appropriate and cooperative throughout examination HEENT: No facial asymmetry,  EOMI, No sinus tenderness, TM's Clear, oropharynx  pink and moist.   Chest: Clear to auscultation bilaterally.  CVS: S1, S2, No murmurs, No S3.   Abd: Soft, Nontender.  MS: Adequate ROM spine, hips, shoulders and knees.  Ext: No edema.   CNS: CN 2-12 intact, power tone and sensation normal throughout.  Skin: Intact, no visible lesions or rashes.  Psych: Good eye contact, normal affect.  Memory intact, not anxious or depressed appearing.    Impression & Recommendations:  Problem # 1:  HYPERTENSION (ICD-401.9) Assessment Unchanged  The following medications were removed from the medication list:    Maxzide-25 37.5-25 Mg Tabs (Triamterene-hctz) ..... One and a half tablets once daily Her updated medication list for this problem includes:    Benazepril Hcl 40 Mg Tabs (Benazepril hcl) ..... One tab by mouth qd    Metoprolol Tartrate 50 Mg Tabs (Metoprolol tartrate) .Marland Kitchen... Take 1 tablet by mouth two times a  day    Amlodipine Besylate 10 Mg Tabs (Amlodipine besylate) .Marland Kitchen... Take 1 tablet by mouth once a day    Clonidine Hcl 0.2 Mg Tabs (Clonidine hcl) .Marland Kitchen... Take 1 tab by mouth at bedtime    Triamterene-hctz 75-50 Mg Tabs (Triamterene-hctz) .Marland Kitchen... Take 1 tablet by mouth once a day , ok to finish current traimteren 25mg  tabs , take two daily  Orders: Medicare Electronic Prescription [phone]) T-CMP with estimated GFR ([address])  BP today: 150/90 Prior BP: 150/90 (03/02/2010)  Labs Reviewed: K+: 3.9 (05/31/2010) Creat: : 0.76 (05/31/2010)   Chol: 153 (01/14/2010)   HDL: 42 (01/14/2010)   LDL: 87 (01/14/2010)   TG: 122 (01/14/2010)  Problem # 2:  DIABETES MELLITUS, TYPE II (ICD-250.00) Assessment: Deteriorated  Her updated medication list for this problem includes:    Benazepril Hcl 40 Mg Tabs (Benazepril hcl) ..... One tab by mouth qd    Metformin Hcl 1000 Mg Tabs (Metformin hcl) .Marland Kitchen... Take 1 tablet by mouth two times a day    Lantus Solostar 100 Unit/ml Soln (Insulin glargine) .Marland KitchenMarland KitchenMarland KitchenMarland Kitchen 40 units daily    Aspir-low 81 Mg Tbec (Aspirin) .Marland Kitchen... Take 1 tablet by mouth once a day  Orders: T- Hemoglobin A1C ([id number])  Labs Reviewed: Creat: 0.76 (05/31/2010)    Reviewed HgBA1c results: 7.0 (05/31/2010)  6.4 (01/14/2010)  Problem # 3:  OBESITY, UNSPECIFIED (ICD-278.00) Assessment: Improved  Ht: 65 (06/02/2010)   Wt: 178.75 (06/02/2010)   BMI: 29.85 (06/02/2010)  Problem # 4:  HYPERLIPIDEMIA (ICD-272.4) Assessment: Comment Only  Her updated medication list for this problem includes:    Lovastatin 40 Mg Tabs (Lovastatin) .Marland Kitchen... Take 1 tab by mouth at bedtime Low fat dietdiscussed and encouraged  Orders: T- Hemoglobin A1C ([id number])  Labs Reviewed: SGOT: 18 (01/14/2010)   SGPT: 17 (01/14/2010)   HDL:42 (01/14/2010), 37 (02/16/2009)  LDL:87 (01/14/2010), 77 (02/16/2009)  Chol:153 (01/14/2010), 147 (02/16/2009)  Trig:122 (01/14/2010), 163 (02/16/2009)  Complete Medication List: 1)   Benazepril Hcl 40 Mg Tabs (Benazepril hcl) .... One tab by mouth qd 2)  Dulcolax 5 Mg Tbec (Bisacodyl) .Marland Kitchen.. 1 tab by mouth two times a day as needed 3)  Metoprolol Tartrate 50 Mg Tabs (Metoprolol tartrate) .... Take 1 tablet by mouth two times a day 4)  Metformin Hcl 1000 Mg Tabs (Metformin hcl) .... Take 1 tablet by mouth two times a day 5)  Lantus Solostar 100 Unit/ml Soln (Insulin glargine) .... 40 units daily 6)  Aspir-low 81 Mg Tbec (Aspirin) .... Take 1 tablet by mouth once a day 7)  Lovastatin 40 Mg Tabs (Lovastatin) .... Take 1 tab by mouth at bedtime 8)  Amlodipine Besylate 10 Mg Tabs (Amlodipine besylate) .... Take 1 tablet by mouth once a day 9)  Clonidine Hcl 0.2 Mg Tabs (Clonidine hcl) .... Take 1 tab by mouth at bedtime 10)  Travatan Z 0.004 % Soln (Travoprost) .Marland KitchenMarland KitchenMarland Kitchen  One drop in each eye at bedtime 11)  Bd Pen Needle Short U/f 31g X 8 Mm Misc (Insulin pen needle) .... Once daily use with lantus 12)  Coq10 50 Mg Caps (Coenzyme q10) .... 2 a day 13)  Triamterene-hctz 75-50 Mg Tabs (Triamterene-hctz) .... Take 1 tablet by mouth once a day , ok to finish current traimteren 25mg  tabs , take two daily  Other Orders: T-Lipid Profile ([id number]) Radiology Referral (Radiology)  Patient Instructions: 1)  Please schedule a follow-up appointment in  3.5 months 2)  your blood sugar has increased, pls inc the lantus to 33 units at night , and eat more vegetables. and less sweet. 3)  your BP is still too high  4)  New dose of triam/hctz 25mg  tWO daily until done. 5)  then 6)  New med triam/hctz 75/50 oNE daily 7)  It is important that you exercise regularly at least 30 minutes 5 times a week. If you develop chest pain, have severe difficulty breathing, or feel very tired , stop exercising immediately and seek medical attention. 8)  You need to lose weight. Consider a lower calorie diet and regular exercise.  9)  Lipid Panel prior to visit, ICD-9: 10)  HbgA1C prior to visit, ICD-9:cMP and  egfr in 3.5 months 11)  mamo will be sched at [facility] 12)  med refills x 4 months Prescriptions: CLONIDINE HCL 0.2 MG TABS (CLONIDINE HCL) Take 1 tab by mouth at bedtime  #90 x 1   Entered by:   Adella Hare LPN   Authorized by:   Syliva Overman MD   Signed by:   Adella Hare LPN on 56/21/3086   Method used:   Electronically to        [facility] [phone]* (retail)       [address]       [place], [place]  [address]       Ph: [phone] or [phone]       Fax: [phone]   RxID:   [id number] AMLODIPINE BESYLATE 10 MG TABS (AMLODIPINE BESYLATE) Take 1 tablet by mouth once a day  #30 x 3   Entered by:   Adella Hare LPN   Authorized by:   Syliva Overman MD   Signed by:   Adella Hare LPN on 16/60/6301   Method used:   Electronically to        [facility] [phone]* (retail)       [address]       [place], [place]  [address]       Ph: [phone] or [phone]       Fax: [phone]   RxID:   [id number] LOVASTATIN 40 MG TABS (LOVASTATIN) Take 1 tab by mouth at bedtime  #30 Tablet x 3   Entered by:   Adella Hare LPN   Authorized by:   Syliva Overman MD   Signed by:   Adella Hare LPN on 50/01/3817   Method used:   Electronically to        [facility] [phone]* (retail)       [address]       [place], [place]  [address]       Ph: [phone] or [phone]       Fax: [phone]   RxID:   [id number] METOPROLOL TARTRATE 50 MG TABS (METOPROLOL TARTRATE) Take 1  tablet by mouth two times a day  #180 x 1   Entered by:   Adella Hare LPN   Authorized by:   Syliva Overman MD   Signed by:   Adella Hare LPN on 91/47/8295   Method used:   Electronically to        [facility] [phone]* (retail)       [address]       [place], [place]  [address]       Ph: [phone] or [phone]       Fax: [phone]   RxID:   [id number] TRIAMTERENE-HCTZ 75-50 MG TABS  (TRIAMTERENE-HCTZ) Take 1 tablet by mouth once a day , ok to finish current traimteren 25mg  tabs , take tWO daily  #90 x 1   Entered and Authorized by:   Syliva Overman MD   Signed by:   Syliva Overman MD on 06/02/2010   Method used:   Printed then faxed to ...       [address] [phone]* (retail)       [address]       [place], [place]  [address]       Ph: [phone] or [phone]       Fax: [phone]   RxID:   [id number]    Orders Added: 1)  Est. Patient Level IV [10626] 2)  Medicare Electronic Prescription [G8553] 3)  T-CMP with estimated GFR [[address]] 4)  T-Lipid Profile [80061-22930] 5)  T- Hemoglobin A1C [83036-23375] 6)  Radiology Referral [Radiology]

## 2010-07-14 ENCOUNTER — Encounter: Payer: Self-pay | Admitting: Family Medicine

## 2010-07-30 LAB — GLUCOSE, CAPILLARY: Glucose-Capillary: 214 mg/dL — ABNORMAL HIGH (ref 70–99)

## 2010-08-01 LAB — CBC
HCT: 39.1 % (ref 36.0–46.0)
Hemoglobin: 13.2 g/dL (ref 12.0–15.0)
MCHC: 33.8 g/dL (ref 30.0–36.0)
MCV: 86.7 fL (ref 78.0–100.0)
Platelets: 173 10*3/uL (ref 150–400)
RBC: 4.51 MIL/uL (ref 3.87–5.11)
RDW: 13.9 % (ref 11.5–15.5)
WBC: 6.2 10*3/uL (ref 4.0–10.5)

## 2010-08-01 LAB — HEPATIC FUNCTION PANEL
ALT: 38 U/L — ABNORMAL HIGH (ref 0–35)
AST: 40 U/L — ABNORMAL HIGH (ref 0–37)
Albumin: 3.7 g/dL (ref 3.5–5.2)
Alkaline Phosphatase: 47 U/L (ref 39–117)
Bilirubin, Direct: <0.1 mg/dL (ref 0.0–0.3)
Total Bilirubin: 0.4 mg/dL (ref 0.3–1.2)
Total Protein: 6.8 g/dL (ref 6.0–8.3)

## 2010-08-01 LAB — BASIC METABOLIC PANEL
BUN: 10 mg/dL (ref 6–23)
CO2: 33 mEq/L — ABNORMAL HIGH (ref 19–32)
Calcium: 9.8 mg/dL (ref 8.4–10.5)
Chloride: 100 mEq/L (ref 96–112)
Creatinine, Ser: 0.68 mg/dL (ref 0.4–1.2)
GFR calc Af Amer: >60 mL/min (ref >60)
GFR calc non Af Amer: >60 mL/min (ref >60)
Glucose, Bld: 103 mg/dL — ABNORMAL HIGH (ref 70–99)
Potassium: 3.1 mEq/L — ABNORMAL LOW (ref 3.5–5.1)
Sodium: 140 mEq/L (ref 135–145)

## 2010-08-01 LAB — POCT I-STAT 4, (NA,K, GLUC, HGB,HCT)
Glucose, Bld: 81 mg/dL (ref 70–99)
HCT: 40 % (ref 36.0–46.0)
Hemoglobin: 13.6 g/dL (ref 12.0–15.0)
Potassium: 4.2 mEq/L (ref 3.5–5.1)
Sodium: 142 mEq/L (ref 135–145)

## 2010-08-01 LAB — GLUCOSE, CAPILLARY: Glucose-Capillary: 102 mg/dL — ABNORMAL HIGH (ref 70–99)

## 2010-08-09 ENCOUNTER — Other Ambulatory Visit: Payer: Self-pay | Admitting: Family Medicine

## 2010-08-22 LAB — GLUCOSE, CAPILLARY: Glucose-Capillary: 140 mg/dL — ABNORMAL HIGH (ref 70–99)

## 2010-08-25 LAB — GLUCOSE, CAPILLARY
Glucose-Capillary: 114 mg/dL — ABNORMAL HIGH (ref 70–99)
Glucose-Capillary: 124 mg/dL — ABNORMAL HIGH (ref 70–99)

## 2010-09-27 ENCOUNTER — Encounter: Payer: Self-pay | Admitting: Family Medicine

## 2010-09-28 ENCOUNTER — Other Ambulatory Visit: Payer: Self-pay | Admitting: Family Medicine

## 2010-09-28 LAB — LIPID PANEL
Cholesterol: 160 mg/dL (ref 0–200)
HDL: 33 mg/dL — ABNORMAL LOW (ref >39)
LDL Cholesterol: 93 mg/dL (ref 0–99)
Total CHOL/HDL Ratio: 4.8 Ratio
Triglycerides: 168 mg/dL — ABNORMAL HIGH (ref <150)
VLDL: 34 mg/dL (ref 0–40)

## 2010-09-28 LAB — HEMOGLOBIN A1C
Hgb A1c MFr Bld: 7 % — ABNORMAL HIGH (ref <5.7)
Mean Plasma Glucose: 154 mg/dL — ABNORMAL HIGH (ref <117)

## 2010-09-28 NOTE — Op Note (Signed)
NAME:  Joanna Brown, Joanna Brown              ACCOUNT NO.:  [id number]   MEDICAL RECORD NO.:  [id number]          PATIENT TYPE:  AMB   LOCATION:  DAY                           FACILITY:  [facility]   PHYSICIAN:  Kassie Mends, M.D.      DATE OF BIRTH:  1941-04-14   DATE OF PROCEDURE:  12/05/2008  DATE OF DISCHARGE:                                PROCEDURE NOTE   REFERRING PHYSICIAN:  Milus Mallick. Lodema Hong, MD   PROCEDURE:  Esophagogastroduodenoscopy with cold forceps biopsy of the  gastric and duodenal mucosa.   INDICATION FOR EXAM:  Joanna Brown is a 70 year old female who presents  with dyspepsia.  It was better with omeprazole, but not completely  resolved.   FINDINGS:  1. Multiple linear erosions extending from the GE junction.  They were      superficial and less than 1 cm.  They were associated with      erythema.  Otherwise, no evidence of Barrett, mass, or stricture.  2. Patchy erythema in the antrum without erosion or ulceration.      Biopsies obtained via cold forceps to evaluate for H. pylori      gastritis.  3. Nodular duodenal bulb.  Biopsies obtained via cold forceps.  Normal      second portion of the duodenum.   DIAGNOSES:  1. Mild esophagitis and gastritis.  2. Nodular duodenal bulb.   RECOMMENDATIONS:  1. She should continue the omeprazole and take 30 minutes prior to her      first meal.  2. Will call her with the results of her biopsies.  3. She should follow a low-fat diet.  She is given a handout on low-      fat diet, reflux, and gastritis.  4. No aspirin, NSAIDs or anticoagulation for 7 days.  5. She already has a follow up appointment to see me in 2 months.   MEDICATIONS:  1. Demerol 50 mg IV.  2. Versed 4 mg IV.   PROCEDURE TECHNIQUE:  Physical exam was performed.  Informed consent was  obtained from the patient after explaining the benefits, risks and  alternatives to the procedure.  The patient was connected to the monitor  and placed in the left lateral  position.  Continuous oxygen was provided  by nasal cannula and IV medicine administered through an indwelling  cannula.  After administration of sedation, the patient's esophagus was  intubated and the scope was advanced under direct visualization to the  second portion of the duodenum.  The scope was removed  slowly by carefully examining the color, texture, anatomy and integrity  of the mucosa on the way out.  The patient was recovered in endoscopy  and discharged home in satisfactory condition.   PATH:  Peptic duodenitis. Benign gastric mucosa. Continue the omeprazole  and low fat diet.      Kassie Mends, M.D.  Electronically Signed     SM/MEDQ  D:  12/05/2008  T:  12/05/2008  Job:  [id number]   cc:   Milus Mallick. Lodema Hong, M.D.  Fax: [phone]

## 2010-09-29 LAB — COMPLETE METABOLIC PANEL WITH GFR
ALT: 22 U/L (ref 0–35)
AST: 26 U/L (ref 0–37)
Albumin: 4.2 g/dL (ref 3.5–5.2)
Alkaline Phosphatase: 41 U/L (ref 39–117)
BUN: 21 mg/dL (ref 6–23)
CO2: 27 mEq/L (ref 19–32)
Calcium: 9.7 mg/dL (ref 8.4–10.5)
Chloride: 102 mEq/L (ref 96–112)
Creat: 0.96 mg/dL (ref 0.40–1.20)
GFR, Est African American: >60 mL/min (ref >60)
GFR, Est Non African American: 58 mL/min — ABNORMAL LOW (ref >60)
Glucose, Bld: 142 mg/dL — ABNORMAL HIGH (ref 70–99)
Potassium: 4.1 mEq/L (ref 3.5–5.3)
Sodium: 140 mEq/L (ref 135–145)
Total Bilirubin: 0.3 mg/dL (ref 0.3–1.2)
Total Protein: 6.8 g/dL (ref 6.0–8.3)

## 2010-10-05 ENCOUNTER — Ambulatory Visit (INDEPENDENT_AMBULATORY_CARE_PROVIDER_SITE_OTHER): Payer: Medicare Other | Admitting: Family Medicine

## 2010-10-05 ENCOUNTER — Encounter: Payer: Self-pay | Admitting: Family Medicine

## 2010-10-05 VITALS — BP 150/82 | HR 58 | Resp 16 | Ht 65.0 in | Wt 175.0 lb

## 2010-10-05 DIAGNOSIS — E119 Type 2 diabetes mellitus without complications: Secondary | ICD-10-CM

## 2010-10-05 DIAGNOSIS — K219 Gastro-esophageal reflux disease without esophagitis: Secondary | ICD-10-CM

## 2010-10-05 DIAGNOSIS — R102 Pelvic and perineal pain: Secondary | ICD-10-CM | POA: Insufficient documentation

## 2010-10-05 DIAGNOSIS — R109 Unspecified abdominal pain: Secondary | ICD-10-CM

## 2010-10-05 DIAGNOSIS — R51 Headache: Secondary | ICD-10-CM | POA: Insufficient documentation

## 2010-10-05 DIAGNOSIS — R519 Headache, unspecified: Secondary | ICD-10-CM | POA: Insufficient documentation

## 2010-10-05 DIAGNOSIS — Z23 Encounter for immunization: Secondary | ICD-10-CM

## 2010-10-05 DIAGNOSIS — N949 Unspecified condition associated with female genital organs and menstrual cycle: Secondary | ICD-10-CM

## 2010-10-05 DIAGNOSIS — I10 Essential (primary) hypertension: Secondary | ICD-10-CM

## 2010-10-05 MED ORDER — AMLODIPINE BESYLATE 10 MG PO TABS: 10.0000 mg | ORAL_TABLET | Freq: Every day | ORAL | Status: DC

## 2010-10-05 MED ORDER — TRIAMTERENE-HCTZ 37.5-25 MG PO TABS: 1.0000 | ORAL_TABLET | Freq: Every day | ORAL | Status: DC

## 2010-10-05 MED ORDER — PANTOPRAZOLE SODIUM 20 MG PO TBEC: 20.0000 mg | DELAYED_RELEASE_TABLET | Freq: Every day | ORAL | Status: DC

## 2010-10-05 MED ORDER — METFORMIN HCL 1000 MG PO TABS: 1000.0000 mg | ORAL_TABLET | Freq: Two times a day (BID) | ORAL | Status: DC

## 2010-10-05 MED ORDER — LOVASTATIN 40 MG PO TABS: 40.0000 mg | ORAL_TABLET | Freq: Every day | ORAL | Status: DC

## 2010-10-05 NOTE — Assessment & Plan Note (Signed)
Pelvic pain wosening h/o ovarian tumor, alo vaginal prolapse

## 2010-10-05 NOTE — Assessment & Plan Note (Addendum)
New disabling at times,, will order a scan

## 2010-10-05 NOTE — Progress Notes (Signed)
  Subjective:    Patient ID: Joanna Brown, female    DOB: 07/09/1940, 70 y.o.   MRN: [id number]  HPI 3 week h/o early morning nausea bloating and belching, has had cholecystectomy in the past 12 to, has noted epigastric discomfort and GERD symptoms intermittently, no dysphagia. 1 month h/o headache, has noted neck pain with cracking and the pain radiates from neck to temporal area, at it's worst it is a 7.Pain occurs daily, takes asprin daily thinks  May help, lasts approx 2 hrs 7 am to 9 am. Sleep is moderate and worry is a lot Blood sugars are tested daily, different times, fasting sugars this month range from 120's to 190' unsure what to eat still  C/o new pelvic pain , has a h/o ovarian tumor, and also c/o vaginal prolapse and fullness and pressure, wants this checked by gynae  Review of Systems Denies recent fever or chills. Denies sinus pressure, nasal congestion, ear pain or sore throat. Denies chest congestion, productive cough or wheezing. Denies chest pains, palpitations, paroxysmal nocturnal dyspnea, orthopnea and leg swelling Denies,diarrhea or constipation.  Denies rectal bleeding or change in bowel movement. Denies dysuria, frequency, hesitancy or incontinence. Denies joint pain, swelling and limitation in mobility. Denies  seizure, numbness, or tingling. Denies depression, anxiety or insomnia. Denies skin break down or rash.        Objective:   Physical Exam Patient alert and oriented and in no Cardiopulmonary distress.  HEENT: No facial asymmetry, EOMI, no sinus tenderness, TM's clear, Oropharynx pink and moist.  Neck supple no adenopathy.  Chest: Clear to auscultation bilaterally.  CVS: S1, S2 no murmurs, no S3.  ABD: Soft , lower abdominal tenderness, no guarading or rebound. Bowel sounds normal. Pelvic not performed, will refer to gynae  Ext: No edema  MS: Adequate ROM thoracolumbar spine, reduced in cervical spine with cracking  Skin: Intact, no  ulcerations or rash noted.  Psych: Good eye contact, normal affect. Memory intact not anxious or depressed appearing.  CNS: CN 2-12 intact, power, tone and sensation normal throughout. Fundoscopy : normal       Assessment & Plan:

## 2010-10-05 NOTE — Patient Instructions (Addendum)
F/U in 4 months.  It is important that you exercise regularly at least 30 minutes 5 times a week. If you develop chest pain, have severe difficulty breathing, or feel very tired, stop exercising immediately and seek medical attention  A healthy diet is rich in fruit, vegetables and whole grains. Poultry fish, nuts and beans are a healthy choice for protein rather then red meat. A low sodium diet and drinking 64 ounces of water daily is generally recommended. Oils and sweet should be limited. Carbohydrates especially for those who are diabetic or overweight, should be limited to 34-45 gram per meal. It is important to eat on a regular schedule, at least 3 times daily. Snacks should be primarily fruits, vegetables or nuts.   You need to make appt and go to the diabetic class.  You are referred for head ct scan also pelvic US and gynae eval  Pls cut back on cheese and fried foods.  New med and blood test for reflux/heartburn

## 2010-10-05 NOTE — Assessment & Plan Note (Signed)
deteriorated 

## 2010-10-06 LAB — H. PYLORI ANTIBODY, IGG: H Pylori IgG: 0.47 {ISR}

## 2010-10-10 NOTE — Assessment & Plan Note (Addendum)
Deteriorated, symptoms of reflux and continual belching, will recheck h pylori, she is s/p cholecysectomy

## 2010-10-10 NOTE — Assessment & Plan Note (Signed)
Unchaned, pt encourged to be more diligent with diet and exercise, no med cahnge

## 2010-10-14 ENCOUNTER — Other Ambulatory Visit: Payer: Self-pay | Admitting: Family Medicine

## 2010-10-14 DIAGNOSIS — R102 Pelvic and perineal pain: Secondary | ICD-10-CM

## 2010-10-19 ENCOUNTER — Ambulatory Visit (HOSPITAL_COMMUNITY): Payer: Medicare Other

## 2010-10-19 ENCOUNTER — Ambulatory Visit (HOSPITAL_COMMUNITY)
Admission: RE | Admit: 2010-10-19 | Discharge: 2010-10-19 | Disposition: A | Discharge status: Home / Self Care | Payer: Medicare Other | Source: Ambulatory Visit | Attending: Family Medicine | Admitting: Family Medicine

## 2010-10-19 DIAGNOSIS — R102 Pelvic and perineal pain: Secondary | ICD-10-CM

## 2010-10-19 DIAGNOSIS — R51 Headache: Secondary | ICD-10-CM | POA: Insufficient documentation

## 2010-10-19 DIAGNOSIS — N949 Unspecified condition associated with female genital organs and menstrual cycle: Secondary | ICD-10-CM | POA: Insufficient documentation

## 2010-10-19 DIAGNOSIS — G319 Degenerative disease of nervous system, unspecified: Secondary | ICD-10-CM | POA: Insufficient documentation

## 2010-10-19 DIAGNOSIS — E119 Type 2 diabetes mellitus without complications: Secondary | ICD-10-CM | POA: Insufficient documentation

## 2010-12-27 ENCOUNTER — Other Ambulatory Visit: Payer: Self-pay | Admitting: Family Medicine

## 2011-01-18 ENCOUNTER — Telehealth: Payer: Self-pay | Admitting: Family Medicine

## 2011-01-18 NOTE — Telephone Encounter (Signed)
Form completed and will fax back.

## 2011-01-19 ENCOUNTER — Other Ambulatory Visit: Payer: Self-pay | Admitting: Family Medicine

## 2011-02-03 LAB — BASIC METABOLIC PANEL
BUN: 12 mg/dL (ref 6–23)
CO2: 28 mEq/L (ref 19–32)
Calcium: 10.1 mg/dL (ref 8.4–10.5)
Chloride: 102 mEq/L (ref 96–112)
Creat: 0.89 mg/dL (ref 0.50–1.10)
Glucose, Bld: 107 mg/dL — ABNORMAL HIGH (ref 70–99)
Potassium: 4.3 mEq/L (ref 3.5–5.3)
Sodium: 140 mEq/L (ref 135–145)

## 2011-02-03 LAB — HEMOGLOBIN A1C
Hgb A1c MFr Bld: 7.3 % — ABNORMAL HIGH (ref <5.7)
Mean Plasma Glucose: 163 mg/dL — ABNORMAL HIGH (ref <117)

## 2011-02-04 ENCOUNTER — Encounter: Payer: Self-pay | Admitting: Family Medicine

## 2011-02-07 ENCOUNTER — Encounter: Payer: Self-pay | Admitting: Family Medicine

## 2011-02-07 ENCOUNTER — Ambulatory Visit (INDEPENDENT_AMBULATORY_CARE_PROVIDER_SITE_OTHER): Payer: Medicare Other | Admitting: Family Medicine

## 2011-02-07 VITALS — BP 132/80 | HR 58 | Resp 16 | Ht 65.0 in | Wt 179.0 lb

## 2011-02-07 DIAGNOSIS — E669 Obesity, unspecified: Secondary | ICD-10-CM

## 2011-02-07 DIAGNOSIS — E785 Hyperlipidemia, unspecified: Secondary | ICD-10-CM

## 2011-02-07 DIAGNOSIS — K219 Gastro-esophageal reflux disease without esophagitis: Secondary | ICD-10-CM

## 2011-02-07 DIAGNOSIS — R5381 Other malaise: Secondary | ICD-10-CM

## 2011-02-07 DIAGNOSIS — E119 Type 2 diabetes mellitus without complications: Secondary | ICD-10-CM

## 2011-02-07 DIAGNOSIS — R5383 Other fatigue: Secondary | ICD-10-CM

## 2011-02-07 DIAGNOSIS — I10 Essential (primary) hypertension: Secondary | ICD-10-CM

## 2011-02-07 MED ORDER — METOPROLOL TARTRATE 50 MG PO TABS: 50.0000 mg | ORAL_TABLET | Freq: Two times a day (BID) | ORAL | Status: DC

## 2011-02-07 MED ORDER — TRIAMTERENE-HCTZ 37.5-25 MG PO TABS: 1.0000 | ORAL_TABLET | Freq: Every day | ORAL | Status: DC

## 2011-02-07 MED ORDER — METFORMIN HCL 1000 MG PO TABS: 1000.0000 mg | ORAL_TABLET | Freq: Two times a day (BID) | ORAL | Status: DC

## 2011-02-07 MED ORDER — CLONIDINE HCL 0.2 MG PO TABS: 0.2000 mg | ORAL_TABLET | Freq: Every day | ORAL | Status: DC

## 2011-02-07 MED ORDER — BENAZEPRIL HCL 40 MG PO TABS: 40.0000 mg | ORAL_TABLET | Freq: Every day | ORAL | Status: DC

## 2011-02-07 MED ORDER — PANTOPRAZOLE SODIUM 20 MG PO TBEC: 20.0000 mg | DELAYED_RELEASE_TABLET | Freq: Every day | ORAL | Status: DC

## 2011-02-07 MED ORDER — LOVASTATIN 40 MG PO TABS: 40.0000 mg | ORAL_TABLET | Freq: Every day | ORAL | Status: DC

## 2011-02-07 MED ORDER — AMLODIPINE BESYLATE 10 MG PO TABS: 10.0000 mg | ORAL_TABLET | Freq: Every day | ORAL | Status: DC

## 2011-02-07 MED ORDER — LOVASTATIN 40 MG PO TABS: ORAL_TABLET | ORAL | Status: DC

## 2011-02-07 NOTE — Assessment & Plan Note (Addendum)
Hyperlipidemia:Low fat diet discussed and encouraged.  No med change at this time     

## 2011-02-07 NOTE — Assessment & Plan Note (Signed)
Deteriorated. Patient re-educated about  the importance of commitment to a  minimum of 150 minutes of exercise per week. The importance of healthy food choices with portion control discussed. Encouraged to start a food diary, count calories and to consider  joining a support group. Sample diet sheets offered. Goals set by the patient for the next several months.    

## 2011-02-07 NOTE — Assessment & Plan Note (Signed)
Deteriorated, low carb diet discussed and encouraged, and closer , more faithful attention to diabetic diet

## 2011-02-07 NOTE — Progress Notes (Signed)
  Subjective:    Patient ID: Joanna Brown, female    DOB: [birth year], 70 y.o.   MRN: [id number]  HPI The PT is here for follow up and re-evaluation of chronic medical conditions, medication management and review of any available recent lab and radiology data.  Preventive health is updated, specifically  Cancer screening and Immunization.   Questions or concerns regarding consultations or procedures which the PT has had in the interim are  addressed. The PT denies any adverse reactions to current medications since the last visit.  Pt concerned about weight gain , poor eating habits and deterioration in blood sugar control. C/o flatulence and bloating espescialy with fatty foods , worse since cholecystectomy. I explained this was to be expected, and she needs to change her diet    Review of Systems See HPI Denies recent fever or chills. Denies sinus pressure, nasal congestion, ear pain or sore throat. Denies chest congestion, productive cough or wheezing. Denies chest pains, palpitations and leg swelling Denies abdominal pain, nausea vomiting,diarrhea or constipation.   Denies dysuria, frequency, hesitancy or incontinence. Denies joint pain, swelling and limitation in mobility. Denies headaches, seizures, numbness, or tingling. Denies depression, anxiety or insomnia. Denies skin break down or rash.         Objective:   Physical Exam Patient alert and oriented and in no cardiopulmonary distress.  HEENT: No facial asymmetry, EOMI, no sinus tenderness,  oropharynx pink and moist.  Neck supple no adenopathy.  Chest: Clear to auscultation bilaterally.  CVS: S1, S2 no murmurs, no S3.  ABD: Soft mild epigastric tenderness, no guarding or rebound,. Bowel sounds normal.  Ext: No edema  MS: Adequate ROM spine, shoulders, hips and knees.  Skin: Intact, no ulcerations or rash noted.  Psych: Good eye contact, normal affect. Memory intact not anxious or depressed appearing.  CNS:  CN 2-12 intact, power, tone and sensation normal throughout. Diabetic Foot Check:  Appearance - no lesions, ulcers or calluses Skin - no unusual pallor or redness Sensation - grossly intact to light touch Monofilament testing -  Right - Great toe, medial, central, lateral ball and posterior foot intact Left - Great toe, medial, central, lateral ball and posterior foot intact Pulses Left - Dorsalis Pedis and Posterior Tibia normal Right - Dorsalis Pedis and Posterior Tibia normal        Assessment & Plan:

## 2011-02-07 NOTE — Patient Instructions (Signed)
CPE in mid January.  Fasting lipid, cmp and eGFR, Hba1C, cbc and TSH in January  LABWORK  NEEDS TO BE DONE BETWEEN 3 TO 7 DAYS BEFORE YOUR NEXT SCEDULED  VISIT.  THIS WILL IMPROVE THE QUALITY OF YOUR CARE.   Blood pressure is great, blood sugar has deteriorated, pls address food choices.  Continue daily exercise.  Flu vaccine recommended.  Mammo to be scheduled at check out

## 2011-02-08 LAB — MICROALBUMIN / CREATININE URINE RATIO
Creatinine, Urine: 145.1 mg/dL
Microalb Creat Ratio: 22.9 mg/g (ref 0.0–30.0)
Microalb, Ur: 3.33 mg/dL — ABNORMAL HIGH (ref 0.00–1.89)

## 2011-04-04 ENCOUNTER — Other Ambulatory Visit: Payer: Self-pay | Admitting: Family Medicine

## 2011-04-20 ENCOUNTER — Encounter (HOSPITAL_COMMUNITY): Payer: Self-pay | Admitting: Pharmacy Technician

## 2011-04-22 ENCOUNTER — Ambulatory Visit (HOSPITAL_COMMUNITY)
Admission: RE | Admit: 2011-04-22 | Discharge: 2011-04-22 | Disposition: A | Discharge status: Home / Self Care | Payer: Medicare Other | Source: Ambulatory Visit | Attending: Urology | Admitting: Urology

## 2011-04-22 ENCOUNTER — Encounter (HOSPITAL_COMMUNITY): Payer: Self-pay

## 2011-04-22 ENCOUNTER — Encounter (HOSPITAL_COMMUNITY)
Admission: RE | Admit: 2011-04-22 | Discharge: 2011-04-22 | Disposition: A | Discharge status: Home / Self Care | Payer: Medicare Other | Source: Ambulatory Visit | Attending: Urology | Admitting: Urology

## 2011-04-22 DIAGNOSIS — N8111 Cystocele, midline: Secondary | ICD-10-CM | POA: Insufficient documentation

## 2011-04-22 DIAGNOSIS — Z01812 Encounter for preprocedural laboratory examination: Secondary | ICD-10-CM | POA: Insufficient documentation

## 2011-04-22 DIAGNOSIS — Z01818 Encounter for other preprocedural examination: Secondary | ICD-10-CM | POA: Insufficient documentation

## 2011-04-22 DIAGNOSIS — N816 Rectocele: Secondary | ICD-10-CM | POA: Insufficient documentation

## 2011-04-22 DIAGNOSIS — I10 Essential (primary) hypertension: Secondary | ICD-10-CM | POA: Insufficient documentation

## 2011-04-22 DIAGNOSIS — E119 Type 2 diabetes mellitus without complications: Secondary | ICD-10-CM | POA: Insufficient documentation

## 2011-04-22 HISTORY — DX: Cardiac arrhythmia, unspecified: I49.9

## 2011-04-22 HISTORY — DX: Encounter for other specified aftercare: Z51.89

## 2011-04-22 LAB — BASIC METABOLIC PANEL
BUN: 12 mg/dL (ref 6–23)
CO2: 29 mEq/L (ref 19–32)
Calcium: 10.3 mg/dL (ref 8.4–10.5)
Chloride: 103 mEq/L (ref 96–112)
Creatinine, Ser: 0.81 mg/dL (ref 0.50–1.10)
GFR calc Af Amer: 84 mL/min — ABNORMAL LOW (ref >90)
GFR calc non Af Amer: 72 mL/min — ABNORMAL LOW (ref >90)
Glucose, Bld: 105 mg/dL — ABNORMAL HIGH (ref 70–99)
Potassium: 3.7 mEq/L (ref 3.5–5.1)
Sodium: 141 mEq/L (ref 135–145)

## 2011-04-22 LAB — SURGICAL PCR SCREEN: MRSA, PCR: NEGATIVE

## 2011-04-22 LAB — CBC
HCT: 36.8 % (ref 36.0–46.0)
Hemoglobin: 12.4 g/dL (ref 12.0–15.0)
MCH: 27.9 pg (ref 26.0–34.0)
MCHC: 33.7 g/dL (ref 30.0–36.0)
MCV: 82.9 fL (ref 78.0–100.0)
Platelets: 194 10*3/uL (ref 150–400)
RBC: 4.44 MIL/uL (ref 3.87–5.11)
RDW: 13.4 % (ref 11.5–15.5)
WBC: 7.2 10*3/uL (ref 4.0–10.5)

## 2011-04-22 LAB — ABO/RH: ABO/RH(D): A POS

## 2011-04-22 LAB — APTT: aPTT: 30 seconds (ref 24–37)

## 2011-04-22 LAB — PROTIME-INR
INR: 1.03 (ref 0.00–1.49)
Prothrombin Time: 13.7 seconds (ref 11.6–15.2)

## 2011-04-22 NOTE — Patient Instructions (Addendum)
20 Joanna Brown  04/22/2011   Your procedure is scheduled on:  04/26/11   Tuesday   Surgery [id number]  Report to [facility] at  0515 AM.  Call this number if you have problems the morning of surgery: [phone]          Questions   Cary Lothrop  PST [phone]   Remember:             Monday -  Take 1/2 DOSAGE OF INSULIN  AND EAT SNACK BEFORE BED OR MIDNIGHT  Do not eat food:After Midnight.   Monday night  May have clear liquids:until Midnight .   Monday night  Clear liquids include soda, tea, black coffee, apple or grape juice, broth.  Take these medicines the morning of surgery with A SIP OF WATER: AMLODIPINE, METOPROLOL, PROTONIX WITH SIP WATER             NO BLOOD SUGAR MEDS AM OF SURGERY  Do not wear jewelry, make-up or nail polish.  Do not wear lotions, powders, or perfumes. You may wear deodorant.  Do not shave 48 hours prior to surgery.  Do not bring valuables to the hospital.  Contacts, dentures or bridgework may not be worn into surgery.  Leave suitcase in the car. After surgery it may be brought to your room.  For patients admitted to the hospital, checkout time is 11:00 AM the day of discharge.             FLEETS ENEMA NIGHT BEFORE SURGERY  RECTALLY  Patients discharged the day of surgery will not be allowed to drive home.  Name and phone number of your driver:   Joanna Brown  husband  Special Instructions: CHG Shower Use Special Wash: 1/2 bottle night before surgery and 1/2 bottle morning of surgery.  REGULAR SOAP FACE AND PRIVATES   NO SHAVING x 48 hours   Please read over the following fact sheets that you were given: MRSA Information

## 2011-04-24 NOTE — H&P (Signed)
History of Present Illness   I was consulted by Dr. Maxie Better regarding Joanna Brown's pelvic organ prolapse which has worsened over many years. Having said that, she feels much better with minimal symptoms since Dr. Cherly Hensen examined her and perhaps she reduced her prolapse.  She was incontinent prior to seeing Dr. Cherly Hensen. She leaked a small amount with coughing, sneezing, bending and lifting and rarely with urgency. She was not wearing pads and denied enuresis.  She voids every 3 hours and gets up twice a night and reports a good flow.   She denies a history of kidney stones and urinary tract infections. In 2004 she had I believe a cystocele repair with or without a bladder suspension.  She is an insulin dependent diabetic. She could feel and see her prolapse. She was trying to reduce. She is sexually active. She can feel vaginal dryness. She was not having pain with intercourse, but it was "in the way". Her bowel function is normal.   Her symptoms have not been _____    Past Medical History Problems  1. History of  Diabetes Mellitus 250.00 2. History of  Glaucoma 365.9 3. History of  Heart Disease 429.9 4. History of  Heartburn 787.1 5. History of  Hypercholesterolemia 272.0 6. History of  Hypertension 401.9  Surgical History Problems  1. History of  Cholecystectomy 2. History of  Hysterectomy V45.77 3. History of  Thyroid Surgery 4. History of  Uterine Surgery 5. History of  Vaginal Surgery  Current Meds 1. AmLODIPine Besylate 10 MG Oral Tablet; Therapy: 29Feb2012 to 2. BD Pen Needle Short U/F 31G X 8 MM Miscellaneous; Therapy: 02Feb2012 to 3. Benazepril HCl 40 MG Oral Tablet; Therapy: 26Mar2012 to 4. CloNIDine HCl 0.2 MG Oral Tablet; Therapy: 23Mar2012 to 5. Lantus SoloStar 100 UNIT/ML Subcutaneous Solution; Therapy: 29Feb2012 to 6. Lovastatin 40 MG Oral Tablet; Therapy: 02Mar2012 to 7. MetFORMIN HCl 1000 MG Oral Tablet; Therapy: 23Mar2012 to 8. Metoprolol  Tartrate 50 MG Oral Tablet; Therapy: 02May2012 to 9. Pantoprazole Sodium 20 MG Oral Tablet Delayed Release; Therapy: 22May2012 to 10. Travatan Z 0.004 % Ophthalmic Solution; Therapy: 29Feb2012 to 11. Triamterene-HCTZ 37.5-25 MG Oral Tablet; Therapy: 17Feb2012 to  Allergies Medication  1. No Known Drug Allergies  Family History Problems  1. Family history of  No Significant Family History  Social History Problems    Caffeine Use   Marital History - Currently Married   Never A Smoker   Occupation: Retired Denied    History of  Alcohol Use  Review of Systems Skin, eye, otolaryngeal, hematologic/lymphatic, pulmonary, endocrine, musculoskeletal and psychiatric system(s) were reviewed and pertinent findings if present are noted.  Genitourinary: incontinence.  Gastrointestinal: heartburn.  Constitutional: night sweats.  Cardiovascular: chest pain.  Neurological: dizziness and headache.    Vitals Vital Signs [Data Includes: Last 1 Day]  25Jul2012 09:29AM  BMI Calculated: 28.99 BSA Calculated: 1.86 Height: 5 ft 5 in Weight: 174 lb  Blood Pressure: 165 / 82 Temperature: 98.6 F Heart Rate: 63  Physical Exam Constitutional: Well nourished and well developed . No acute distress.  ENT:. The ears and nose are normal in appearance.  Neck: The appearance of the neck is normal and no neck mass is present.  Pulmonary: No respiratory distress and normal respiratory rhythm and effort.  Cardiovascular: Heart rate and rhythm are normal . No peripheral edema.  Abdomen: The abdomen is soft and nontender. No masses are palpated. No CVA tenderness. No hernias are palpable. No hepatosplenomegaly noted.  Lymphatics:  The femoral and inguinal nodes are not enlarged or tender.  Skin: Normal skin turgor, no visible rash and no visible skin lesions.  Neuro/Psych:. Mood and affect are appropriate.   . Genitourinary: On pelvic examination Joanna Brown had an impressive grade 3 cystocele that  reached the introitus with shiny epithelium associated with it.  Vaginal cuff descended from 8 or 9 cm to approximately 4 cm.  With the cuff and anterior vaginal wall reduced, she had a grade 2 cystocele that was a bit diffuse but I think was actually bulging up that would require repair if she had a cystocele repair and vault suspension.  She had no stress incontinence but she had hypermobility of the bladder neck.    Results/Data   Joanna Brown underwent a number of tests which I personally reviewed. Urinalysis: Negative.  Uroflowmetry: She voided 295 mL with a maximum flow of 40 mL per second.   Bladder scan: Bladder scan residual was 16 mL.   I reviewed her medical records. She had a negative urinalysis with Dr. Cherly Hensen. She noted a prolapse repair in 2004. She is on a number of medications including aspirin and cardiac medications. She had noted a cystocele and perhaps a rectocele. She called for her previous operative note and consulted urology. Urine [Data Includes: Last 1 Day]  25Jul2012  COLOR: YELLOW  Reference Range YELLOW APPEARANCE: CLEAR  Reference Range CLEAR SPECIFIC GRAVITY: <1.005  Abnormal Low Reference Range 1.005-1.030 pH: 6.0  Reference Range 5.0-8.0 GLUCOSE: NEG mg/dL Reference Range NEG BILIRUBIN: NEG  Reference Range NEG KETONE: NEG mg/dL Reference Range NEG BLOOD: TRACE  Abnormal Reference Range NEG PROTEIN: NEG mg/dL Reference Range NEG UROBILINOGEN: 0.2 mg/dL Reference Range 1.6-1.0 NITRITE: NEG  Reference Range NEG LEUKOCYTE ESTERASE: NEG  Reference Range NEG SQUAMOUS EPITHELIAL/HPF: RARE  Reference Range RARE WBC: 0-3 WBC/hpf Reference Range <4 RBC: 0-3 RBC/hpf Reference Range <4 BACTERIA: RARE  Reference Range RARE CRYSTALS: NONE SEEN  Reference Range NEG CASTS: NONE SEEN  Reference Range NEG  Assessment Assessed  1. Cystocele 596.89 2. Rectocele 596.89  Plan Health Maintenance (V70.0)  1. UA With REFLEX  Done: 25Jul2012  09:23AM  Discussion/Summary   Mr. Vasconez was experiencing mild stress and urge incontinence. Her urge incontinence was infrequent and her stress incontinence was mild. She has mild nocturia.   Joanna Brown has a symptomatic cystocele and rectocele though it is minimally symptomatic at this point in time. I drew her and her husband a picture. If she had surgery she would likely benefit from a transvaginal vault suspension plus cystocele repair plus graft and likely a posterior repair. Urodynamics will be utilized to help sort out whether or not she should have a sling simultaneously. We will proceed accordingly. I will send a copy of my note to Dr. Maxie Better to keep her updated on the treatment course.   After a thorough review of the management options for the patient's condition the patient  elected to proceed with surgical therapy as noted above. We have discussed the potential benefits and risks of the procedure, side effects of the proposed treatment, the likelihood of the patient achieving the goals of the procedure, and any potential problems that might occur during the procedure or recuperation. Informed consent has been obtained.   CC: Maxie Better, MD

## 2011-04-25 ENCOUNTER — Encounter (HOSPITAL_COMMUNITY): Payer: Self-pay

## 2011-04-26 ENCOUNTER — Encounter (HOSPITAL_COMMUNITY): Payer: Self-pay

## 2011-04-26 ENCOUNTER — Encounter (HOSPITAL_COMMUNITY): Payer: Self-pay | Admitting: Anesthesiology

## 2011-04-26 ENCOUNTER — Encounter (HOSPITAL_COMMUNITY)
Admission: RE | Disposition: A | Discharge status: Home / Self Care | Payer: Self-pay | Source: Ambulatory Visit | Attending: Urology

## 2011-04-26 ENCOUNTER — Ambulatory Visit (HOSPITAL_COMMUNITY)
Admission: RE | Admit: 2011-04-26 | Discharge: 2011-04-27 | Disposition: A | Discharge status: Home / Self Care | Payer: Medicare Other | Source: Ambulatory Visit | Attending: Urology | Admitting: Urology

## 2011-04-26 ENCOUNTER — Ambulatory Visit (HOSPITAL_COMMUNITY): Payer: Medicare Other | Admitting: Anesthesiology

## 2011-04-26 DIAGNOSIS — E7800 Pure hypercholesterolemia, unspecified: Secondary | ICD-10-CM | POA: Insufficient documentation

## 2011-04-26 DIAGNOSIS — Z90710 Acquired absence of both cervix and uterus: Secondary | ICD-10-CM | POA: Insufficient documentation

## 2011-04-26 DIAGNOSIS — N816 Rectocele: Secondary | ICD-10-CM | POA: Insufficient documentation

## 2011-04-26 DIAGNOSIS — Z79899 Other long term (current) drug therapy: Secondary | ICD-10-CM | POA: Insufficient documentation

## 2011-04-26 DIAGNOSIS — Z01811 Encounter for preprocedural respiratory examination: Secondary | ICD-10-CM | POA: Insufficient documentation

## 2011-04-26 DIAGNOSIS — I10 Essential (primary) hypertension: Secondary | ICD-10-CM

## 2011-04-26 DIAGNOSIS — E119 Type 2 diabetes mellitus without complications: Secondary | ICD-10-CM | POA: Insufficient documentation

## 2011-04-26 DIAGNOSIS — N8111 Cystocele, midline: Secondary | ICD-10-CM | POA: Insufficient documentation

## 2011-04-26 DIAGNOSIS — Z794 Long term (current) use of insulin: Secondary | ICD-10-CM | POA: Insufficient documentation

## 2011-04-26 HISTORY — PX: VAGINAL PROLAPSE REPAIR: SHX830

## 2011-04-26 HISTORY — PX: ANTERIOR AND POSTERIOR REPAIR: SHX5121

## 2011-04-26 LAB — CBC
HCT: 37.7 % (ref 36.0–46.0)
Hemoglobin: 12.9 g/dL (ref 12.0–15.0)
MCH: 28.4 pg (ref 26.0–34.0)
MCHC: 34.2 g/dL (ref 30.0–36.0)
MCV: 82.9 fL (ref 78.0–100.0)
Platelets: 189 10*3/uL (ref 150–400)
RBC: 4.55 MIL/uL (ref 3.87–5.11)
RDW: 13.6 % (ref 11.5–15.5)
WBC: 13.2 10*3/uL — ABNORMAL HIGH (ref 4.0–10.5)

## 2011-04-26 LAB — GLUCOSE, CAPILLARY
Glucose-Capillary: 89 mg/dL (ref 70–99)
Glucose-Capillary: 106 mg/dL — ABNORMAL HIGH (ref 70–99)
Glucose-Capillary: 138 mg/dL — ABNORMAL HIGH (ref 70–99)
Glucose-Capillary: 183 mg/dL — ABNORMAL HIGH (ref 70–99)
Glucose-Capillary: 181 mg/dL — ABNORMAL HIGH (ref 70–99)

## 2011-04-26 LAB — TYPE AND SCREEN
ABO/RH(D): A POS
Antibody Screen: NEGATIVE

## 2011-04-26 SURGERY — ANTERIOR (CYSTOCELE) AND POSTERIOR REPAIR (RECTOCELE)
Anesthesia: General | Wound class: Clean Contaminated

## 2011-04-26 MED ORDER — MIDAZOLAM HCL 5 MG/5ML IJ SOLN
INTRAMUSCULAR | Status: DC | PRN
  Administered 2011-04-26: 1 mg via INTRAVENOUS

## 2011-04-26 MED ORDER — CLONIDINE HCL 0.2 MG PO TABS
0.2000 mg | ORAL_TABLET | Freq: Every day | ORAL | Status: DC
  Administered 2011-04-26: 0.2 mg via ORAL
  Filled 2011-04-26 (×3): qty 1

## 2011-04-26 MED ORDER — KETAMINE HCL 10 MG/ML IJ SOLN
INTRAMUSCULAR | Status: DC | PRN
  Administered 2011-04-26 (×2): 5 mg via INTRAVENOUS

## 2011-04-26 MED ORDER — STERILE WATER FOR IRRIGATION IR SOLN
Status: DC | PRN
  Administered 2011-04-26: 3000 mL

## 2011-04-26 MED ORDER — LACTATED RINGERS IV SOLN
INTRAVENOUS | Status: DC | PRN
  Administered 2011-04-26 (×3): via INTRAVENOUS

## 2011-04-26 MED ORDER — ESTRADIOL 0.1 MG/GM VA CREA
TOPICAL_CREAM | VAGINAL | Status: DC | PRN
  Administered 2011-04-26: 2 via VAGINAL

## 2011-04-26 MED ORDER — NEOSTIGMINE METHYLSULFATE 1 MG/ML IJ SOLN
INTRAMUSCULAR | Status: DC | PRN
  Administered 2011-04-26: 3 mg via INTRAVENOUS

## 2011-04-26 MED ORDER — GENTAMICIN SULFATE 40 MG/ML IJ SOLN
400.0000 mg | INTRAVENOUS | Status: DC
  Administered 2011-04-26: 400 mg via INTRAVENOUS
  Filled 2011-04-26 (×3): qty 10

## 2011-04-26 MED ORDER — CISATRACURIUM BESYLATE 2 MG/ML IV SOLN
INTRAVENOUS | Status: DC | PRN
  Administered 2011-04-26 (×2): 1 mg via INTRAVENOUS
  Administered 2011-04-26: 2 mg via INTRAVENOUS
  Administered 2011-04-26: 6 mg via INTRAVENOUS

## 2011-04-26 MED ORDER — HYDROCODONE-ACETAMINOPHEN 5-325 MG PO TABS: 1.0000 | ORAL_TABLET | ORAL | Status: DC | PRN

## 2011-04-26 MED ORDER — METFORMIN HCL 500 MG PO TABS
1000.0000 mg | ORAL_TABLET | Freq: Two times a day (BID) | ORAL | Status: DC
  Administered 2011-04-26 – 2011-04-27 (×3): 1000 mg via ORAL
  Filled 2011-04-26 (×5): qty 2

## 2011-04-26 MED ORDER — TRIAMTERENE-HCTZ 37.5-25 MG PO TABS
1.5000 | ORAL_TABLET | Freq: Every day | ORAL | Status: DC
  Administered 2011-04-26 – 2011-04-27 (×2): 1.5 via ORAL
  Filled 2011-04-26 (×3): qty 1.5

## 2011-04-26 MED ORDER — LACTATED RINGERS IV SOLN: INTRAVENOUS | Status: DC

## 2011-04-26 MED ORDER — PROPOFOL 10 MG/ML IV EMUL
INTRAVENOUS | Status: DC | PRN
  Administered 2011-04-26 (×2): 10 mg via INTRAVENOUS
  Administered 2011-04-26: 150 mg via INTRAVENOUS

## 2011-04-26 MED ORDER — MEPERIDINE HCL 50 MG/ML IJ SOLN: 6.2500 mg | INTRAMUSCULAR | Status: DC | PRN

## 2011-04-26 MED ORDER — FENTANYL CITRATE 0.05 MG/ML IJ SOLN
INTRAMUSCULAR | Status: DC | PRN
  Administered 2011-04-26: 100 ug via INTRAVENOUS
  Administered 2011-04-26: 50 ug via INTRAVENOUS
  Administered 2011-04-26: 25 ug via INTRAVENOUS
  Administered 2011-04-26: 50 ug via INTRAVENOUS
  Administered 2011-04-26: 25 ug via INTRAVENOUS
  Administered 2011-04-26 (×2): 50 ug via INTRAVENOUS

## 2011-04-26 MED ORDER — INSULIN ASPART 100 UNIT/ML ~~LOC~~ SOLN
0.0000 [IU] | SUBCUTANEOUS | Status: DC
  Administered 2011-04-26: 3 [IU] via SUBCUTANEOUS
  Administered 2011-04-26: 2 [IU] via SUBCUTANEOUS
  Administered 2011-04-26: 3 [IU] via SUBCUTANEOUS
  Administered 2011-04-27: 2 [IU] via SUBCUTANEOUS
  Administered 2011-04-27 (×2): 3 [IU] via SUBCUTANEOUS
  Administered 2011-04-27: 2 [IU] via SUBCUTANEOUS
  Filled 2011-04-26: qty 3

## 2011-04-26 MED ORDER — GLYCOPYRROLATE 0.2 MG/ML IJ SOLN
INTRAMUSCULAR | Status: DC | PRN
  Administered 2011-04-26: .4 mg via INTRAVENOUS

## 2011-04-26 MED ORDER — SUCCINYLCHOLINE CHLORIDE 20 MG/ML IJ SOLN
INTRAMUSCULAR | Status: DC | PRN
  Administered 2011-04-26: 100 mg via INTRAVENOUS

## 2011-04-26 MED ORDER — PANTOPRAZOLE SODIUM 20 MG PO TBEC
20.0000 mg | DELAYED_RELEASE_TABLET | Freq: Every day | ORAL | Status: DC | PRN
  Filled 2011-04-26: qty 1

## 2011-04-26 MED ORDER — METOPROLOL TARTRATE 50 MG PO TABS
50.0000 mg | ORAL_TABLET | Freq: Two times a day (BID) | ORAL | Status: DC
  Administered 2011-04-26 – 2011-04-27 (×3): 50 mg via ORAL
  Filled 2011-04-26 (×6): qty 1

## 2011-04-26 MED ORDER — EPHEDRINE SULFATE 50 MG/ML IJ SOLN
INTRAMUSCULAR | Status: DC | PRN
  Administered 2011-04-26: 5 mg via INTRAVENOUS

## 2011-04-26 MED ORDER — ACETAMINOPHEN 10 MG/ML IV SOLN
1000.0000 mg | Freq: Four times a day (QID) | INTRAVENOUS | Status: AC
  Administered 2011-04-26 – 2011-04-27 (×3): 1000 mg via INTRAVENOUS
  Filled 2011-04-26 (×3): qty 100

## 2011-04-26 MED ORDER — SODIUM CHLORIDE 0.9 % IR SOLN
Status: DC | PRN
  Administered 2011-04-26: 08:00:00

## 2011-04-26 MED ORDER — AMLODIPINE BESYLATE 10 MG PO TABS
10.0000 mg | ORAL_TABLET | ORAL | Status: DC
  Administered 2011-04-27: 10 mg via ORAL
  Filled 2011-04-26 (×2): qty 1

## 2011-04-26 MED ORDER — SODIUM CHLORIDE 0.9 % IV SOLN
1.5000 g | Freq: Once | INTRAVENOUS | Status: AC
  Administered 2011-04-26: 1.5 g via INTRAVENOUS

## 2011-04-26 MED ORDER — TRAVOPROST (BAK FREE) 0.004 % OP SOLN
1.0000 [drp] | Freq: Every day | OPHTHALMIC | Status: DC
  Administered 2011-04-26: 1 [drp] via OPHTHALMIC
  Filled 2011-04-26: qty 2.5

## 2011-04-26 MED ORDER — INDIGOTINDISULFONATE SODIUM 8 MG/ML IJ SOLN
INTRAMUSCULAR | Status: DC | PRN
  Administered 2011-04-26: 5 mL via INTRAVENOUS

## 2011-04-26 MED ORDER — SIMVASTATIN 5 MG PO TABS
5.0000 mg | ORAL_TABLET | Freq: Every day | ORAL | Status: DC
  Administered 2011-04-26 – 2011-04-27 (×2): 5 mg via ORAL
  Filled 2011-04-26 (×3): qty 1

## 2011-04-26 MED ORDER — SODIUM CHLORIDE 0.9 % IV SOLN
1.5000 g | Freq: Four times a day (QID) | INTRAVENOUS | Status: AC
  Administered 2011-04-26 (×2): 1.5 g via INTRAVENOUS
  Filled 2011-04-26 (×3): qty 1.5

## 2011-04-26 MED ORDER — GENTAMICIN IN SALINE 1.6-0.9 MG/ML-% IV SOLN
80.0000 mg | Freq: Once | INTRAVENOUS | Status: AC
  Administered 2011-04-26: 80 mg via INTRAVENOUS

## 2011-04-26 MED ORDER — ACETAMINOPHEN 10 MG/ML IV SOLN
INTRAVENOUS | Status: DC | PRN
  Administered 2011-04-26: 1000 mg via INTRAVENOUS

## 2011-04-26 MED ORDER — ONDANSETRON HCL 4 MG/2ML IJ SOLN
INTRAMUSCULAR | Status: DC | PRN
  Administered 2011-04-26: 1 mg via INTRAVENOUS
  Administered 2011-04-26: 2 mg via INTRAVENOUS

## 2011-04-26 MED ORDER — LIDOCAINE-EPINEPHRINE 1 %-1:100000 IJ SOLN
INTRAMUSCULAR | Status: DC | PRN
  Administered 2011-04-26: 40 mL

## 2011-04-26 MED ORDER — MORPHINE SULFATE 2 MG/ML IJ SOLN
1.0000 mg | INTRAMUSCULAR | Status: DC | PRN
  Administered 2011-04-26: 2 mg via INTRAVENOUS

## 2011-04-26 MED ORDER — POTASSIUM CHLORIDE IN NACL 20-0.45 MEQ/L-% IV SOLN
INTRAVENOUS | Status: DC
  Administered 2011-04-26 – 2011-04-27 (×2): via INTRAVENOUS
  Filled 2011-04-26 (×6): qty 1000

## 2011-04-26 MED ORDER — BENAZEPRIL HCL 40 MG PO TABS
40.0000 mg | ORAL_TABLET | ORAL | Status: DC
  Administered 2011-04-27: 40 mg via ORAL
  Filled 2011-04-26 (×2): qty 1

## 2011-04-26 MED ORDER — LIDOCAINE HCL (CARDIAC) 20 MG/ML IV SOLN
INTRAVENOUS | Status: DC | PRN
  Administered 2011-04-26: 20 mg via INTRAVENOUS

## 2011-04-26 MED ORDER — MORPHINE SULFATE 2 MG/ML IJ SOLN
2.0000 mg | INTRAMUSCULAR | Status: DC | PRN
  Administered 2011-04-26 – 2011-04-27 (×5): 2 mg via INTRAVENOUS
  Filled 2011-04-26 (×5): qty 1

## 2011-04-26 MED ORDER — ACETAMINOPHEN 10 MG/ML IV SOLN
1000.0000 mg | Freq: Four times a day (QID) | INTRAVENOUS | Status: DC
  Administered 2011-04-26: 1000 mg via INTRAVENOUS
  Filled 2011-04-26 (×4): qty 100

## 2011-04-26 MED ORDER — PROMETHAZINE HCL 25 MG/ML IJ SOLN: 6.2500 mg | INTRAMUSCULAR | Status: DC | PRN

## 2011-04-26 SURGICAL SUPPLY — 62 items
BAG URINE DRAINAGE (UROLOGICAL SUPPLIES) ×2 IMPLANT
BLADE HEX COATED 2.75 (ELECTRODE) ×2 IMPLANT
BLADE SURG 15 STRL LF DISP TIS (BLADE) ×2 IMPLANT
BLADE SURG 15 STRL SS (BLADE) ×2
BRIEF STRETCH FOR OB PAD LRG (UNDERPADS AND DIAPERS) IMPLANT
CANISTER SUCTION 2500CC (MISCELLANEOUS) ×2 IMPLANT
CATH FOLEY 2WAY SLVR  5CC 14FR (CATHETERS) ×1
CATH FOLEY 2WAY SLVR  5CC 16FR (CATHETERS)
CATH FOLEY 2WAY SLVR 5CC 14FR (CATHETERS) ×1 IMPLANT
CATH FOLEY 2WAY SLVR 5CC 16FR (CATHETERS) IMPLANT
CLOTH BEACON ORANGE TIMEOUT ST (SAFETY) ×2 IMPLANT
COVER MAYO STAND STRL (DRAPES) IMPLANT
COVER SURGICAL LIGHT HANDLE (MISCELLANEOUS) ×2 IMPLANT
DECANTER SPIKE VIAL GLASS SM (MISCELLANEOUS) ×4 IMPLANT
DERMABOND ADVANCED (GAUZE/BANDAGES/DRESSINGS)
DERMABOND ADVANCED .7 DNX12 (GAUZE/BANDAGES/DRESSINGS) IMPLANT
DEVICE CAPIO SUTURING (INSTRUMENTS) ×1
DEVICE CAPIO SUTURING OPC (INSTRUMENTS) ×1 IMPLANT
DRAIN PENROSE 18X1/4 LTX STRL (WOUND CARE) ×2 IMPLANT
DRAPE LG THREE QUARTER DISP (DRAPES) ×2 IMPLANT
GAUZE PACKING 2X5 YD STERILE (GAUZE/BANDAGES/DRESSINGS) IMPLANT
GAUZE SPONGE 4X4 16PLY XRAY LF (GAUZE/BANDAGES/DRESSINGS) ×6 IMPLANT
GLOVE BIOGEL M STRL SZ7.5 (GLOVE) ×2 IMPLANT
GOWN STRL REIN XL XLG (GOWN DISPOSABLE) ×2 IMPLANT
HOLDER FOLEY CATH W/STRAP (MISCELLANEOUS) IMPLANT
IV NS 1000ML (IV SOLUTION)
IV NS 1000ML BAXH (IV SOLUTION) IMPLANT
KIT BASIN OR (CUSTOM PROCEDURE TRAY) ×2 IMPLANT
NEEDLE HYPO 22GX1.5 SAFETY (NEEDLE) ×2 IMPLANT
NEEDLE MAYO .5 CIRCLE (NEEDLE) IMPLANT
NEEDLE MAYO 6 CRC TAPER PT (NEEDLE) ×2 IMPLANT
NS IRRIG 1000ML POUR BTL (IV SOLUTION) ×2 IMPLANT
PACK CYSTO (CUSTOM PROCEDURE TRAY) ×2 IMPLANT
PACKING VAGINAL (PACKING) ×2 IMPLANT
PENCIL BUTTON HOLSTER BLD 10FT (ELECTRODE) ×2 IMPLANT
PLUG CATH AND CAP STER (CATHETERS) ×2 IMPLANT
RETRACTOR STAY HOOK 5MM (MISCELLANEOUS) ×2 IMPLANT
SHEET LAVH (DRAPES) ×2 IMPLANT
SUT CAPIO ETHIBPND (SUTURE) ×4 IMPLANT
SUT CAPIO POLYGLYCOLIC (SUTURE) IMPLANT
SUT ETHIBOND 0 (SUTURE) IMPLANT
SUT SILK 2 0 30  PSL (SUTURE)
SUT SILK 2 0 30 PSL (SUTURE) IMPLANT
SUT SILK 2 0 SH (SUTURE) IMPLANT
SUT VIC AB 0 CT1 27 (SUTURE) ×1
SUT VIC AB 0 CT1 27XBRD ANTBC (SUTURE) ×1 IMPLANT
SUT VIC AB 2-0 CT1 27 (SUTURE) ×2
SUT VIC AB 2-0 CT1 27XBRD (SUTURE) ×2 IMPLANT
SUT VIC AB 2-0 SH 27 (SUTURE) ×8
SUT VIC AB 2-0 SH 27X BRD (SUTURE) ×8 IMPLANT
SUT VIC AB 3-0 PS2 18 (SUTURE)
SUT VIC AB 3-0 PS2 18XBRD (SUTURE) IMPLANT
SUT VIC AB 3-0 SH 27 (SUTURE) ×3
SUT VIC AB 3-0 SH 27X BRD (SUTURE) ×1 IMPLANT
SUT VIC AB 3-0 SH 27XBRD (SUTURE) ×2 IMPLANT
SUT VIC AB 4-0 PS2 27 (SUTURE) IMPLANT
SUT VICRYL 0 UR6 27IN ABS (SUTURE) ×4 IMPLANT
SYRINGE 10CC LL (SYRINGE) ×2 IMPLANT
TISSUE REPAIR XENFORM 6X10CM (Tissue) ×2 IMPLANT
TUBING CONNECTING 10 (TUBING) ×2 IMPLANT
WATER STERILE IRR 1500ML POUR (IV SOLUTION) IMPLANT
YANKAUER SUCT BULB TIP 10FT TU (MISCELLANEOUS) ×2 IMPLANT

## 2011-04-26 NOTE — Progress Notes (Signed)
Vitals normal- BP elevated x 1 Laboratory tests normal Patient alert and stable Pain in vagina in midline; no leg pain; no buttock pain Continue postop care

## 2011-04-26 NOTE — Transfer of Care (Signed)
Immediate Anesthesia Transfer of Care Note  Patient: Joanna Brown  Procedure(s) Performed:  ANTERIOR (CYSTOCELE) AND POSTERIOR REPAIR (RECTOCELE); VAGINAL VAULT SUSPENSION - with Graft  10x6  Patient Location: PACU  Anesthesia Type: General  Level of Consciousness: sedated  Airway & Oxygen Therapy: Patient Spontanous Breathing and Patient connected to face mask  Post-op Assessment: Report given to PACU RN and Post -op Vital signs reviewed and stable  Post vital signs: Reviewed and stable  Complications: No apparent anesthesia complications

## 2011-04-26 NOTE — Anesthesia Preprocedure Evaluation (Addendum)
Anesthesia Evaluation  Patient identified by MRN, date of birth, ID band Patient awake    Reviewed: Allergy & Precautions, H&P , NPO status , Patient's Chart, lab work & pertinent test results  Airway Mallampati: II TM Distance: >3 FB Neck ROM: Full    Dental No notable dental hx.    Pulmonary neg pulmonary ROS,  clear to auscultation  Pulmonary exam normal       Cardiovascular hypertension, Pt. on medications neg cardio ROS - dysrhythmias Regular Normal    Neuro/Psych  Headaches, Negative Neurological ROS  Negative Psych ROS   GI/Hepatic negative GI ROS, Neg liver ROS,   Endo/Other  Negative Endocrine ROSDiabetes mellitus-, Type 2, Oral Hypoglycemic Agents and Insulin Dependent  Renal/GU negative Renal ROS  Genitourinary negative   Musculoskeletal negative musculoskeletal ROS (+)   Abdominal   Peds negative pediatric ROS (+)  Hematology negative hematology ROS (+)   Anesthesia Other Findings   Reproductive/Obstetrics negative OB ROS                          Anesthesia Physical Anesthesia Plan  ASA: II  Anesthesia Plan: General   Post-op Pain Management:    Induction: Intravenous  Airway Management Planned: LMA  Additional Equipment:   Intra-op Plan:   Post-operative Plan: Extubation in OR  Informed Consent: I have reviewed the patients History and Physical, chart, labs and discussed the procedure including the risks, benefits and alternatives for the proposed anesthesia with the patient or authorized representative who has indicated his/her understanding and acceptance.   Dental advisory given  Plan Discussed with: CRNA  Anesthesia Plan Comments:        Anesthesia Quick Evaluation

## 2011-04-26 NOTE — Interval H&P Note (Signed)
History and Physical Interval Note:  04/26/2011 7:12 AM  Joanna Brown  has presented today for surgery, with the diagnosis of cystocele,rectocele,vault prolapse  The various methods of treatment have been discussed with the patient and family. After consideration of risks, benefits and other options for treatment, the patient has consented to  Procedure(s): ANTERIOR (CYSTOCELE) AND POSTERIOR REPAIR (RECTOCELE) VAGINAL VAULT SUSPENSION as a surgical intervention .  The patients' history has been reviewed, patient examined, no change in status, stable for surgery.  I have reviewed the patients' chart and labs.  Questions were answered to the patient's satisfaction.     Zyon Rosser A

## 2011-04-26 NOTE — Op Note (Signed)
Preoperative diagnosis: Vault prolapse cystocele plus rectocele Postoperative diagnosis: Vault prolapse cystocele was rectocele Surgery vault prolapse repair; cystocele repair plus graft; rectocele repair; cystoscopy Surgeon Dr. Lorin Picket Allsion [place] Asst. Pecola Leisure  The patient has the above diagnoses he consented for procedure. Extra care was taken with leg positioning to minimize the risk of compartment syndrome neuropathy and deep vein thrombosis. Preoperative laboratory tests were normal. Preoperative antibiotics were given  I identified the vaginal cuff in place a 3-0 Vicryl. Her vaginal cuff almost reached the introitus and she'll large central defect grade 3 cystocele and mild diffuse grade 2 rectocele  Using my usual Allis clamps and she may incision I opened the anterior vaginal wall after instilling approximately 25 cc of a lidocaine epinephrine mixture. I sharply dissected the anterior vaginal wall from the underlying pubocervical fascia to the white line bilaterally. I mobilized nicely at the vaginal apex.  I imbricated the cystocele using 2-0 Vicryl anatomically. I then cystoscoped the patient and was good blue jets bilaterally and no injury to bladder or urethra.  Height bluntly dissected to the initial spine bilaterally feel the sacrospinous ligament bilaterally. I placed a 0 Ethibond suture 1 fingerbreadth medial to the initial spine bilaterally in a straight line between the 2 spines. I double checked the position twice with a rectal examination and there is no suture in the rectum or injury to rectum.  With the usual technique I placed a 0 Vicryl on UR 6 needle in the pelvic sidewall at the level of the urethrovesical angle. I cut a 10 x 6 dermal graft and a trapezoid and sewed it in place tension-free. I then trimmed a moderate amount of anterior vaginal wall and closed the anterior vaginal wall with running 2-0 Vicryl and CT1 needle. Is very happy with the repair  I then did  a rectal examination and she did diffuse posterior defect but that it was best to do a posterior repair. I instilled approximately 10 cc of a lidocaine epinephrine mixture. Between 2 Allis clamps at the level of the posterior fourchette I removed a small triangle of skin. I then made posterior vaginal wall incision and sharply dissected the thin posterior vaginal mucosa from the underlying rectovaginal fascia. I mobilized nicely the apex and lateral walls. Indirect examination and she had a large apical defect.  In a trapdoor manner I sutured the fairly thick posterior rectovaginal fascia to the apex with interrupted 2-0 Vicryl sutures closing the trapdoor. She did not need any further repair at this point. I trimmed the posterior vaginal wall only a few millimeters and close it with a 2-0 Vicryl suture on a CT1 needle. I exteriorize it at the introitus and closed the perineum subcuticularly. I had placed 1 gentle 0 Vicryl suture in the perineal body.  I was very pleased to both repairs. Vaginal length and girth it was normal. The lead position was good. She only had a modest output throughout the case and this was noted.  Hopefully the patient will achieve her treatment goals

## 2011-04-26 NOTE — Anesthesia Postprocedure Evaluation (Signed)
  Anesthesia Post-op Note  Patient: Joanna Brown  Procedure(s) Performed:  ANTERIOR (CYSTOCELE) AND POSTERIOR REPAIR (RECTOCELE); VAGINAL VAULT SUSPENSION - with Graft  10x6  Patient Location: PACU  Anesthesia Type: General  Level of Consciousness: awake and alert   Airway and Oxygen Therapy: Patient Spontanous Breathing  Post-op Pain: mild  Post-op Assessment: Post-op Vital signs reviewed, Patient's Cardiovascular Status Stable, Respiratory Function Stable, Patent Airway and No signs of Nausea or vomiting  Post-op Vital Signs: stable  Complications: No apparent anesthesia complications

## 2011-04-26 NOTE — Progress Notes (Signed)
Unable to ambulate at this time. Pt pain uncontrolled at this time.  She has been rating it 10/10.  2mg  of Morphine was given then another 2mg  of Morphine given after her pain was unrelieved.  Will attempt to ambulate patient once her pain is under control. Regenia Erck, Joslyn Devon

## 2011-04-26 NOTE — Progress Notes (Signed)
ANTIBIOTIC CONSULT NOTE - INITIAL  Pharmacy Consult for Gentamicin Indication: Synergy  No Known Allergies  Patient Measurements:   Weight recorded on 04/22/11 = 80.7kg Ht recorded on 04/22/11 = 165 cm (65 inches)  Vital Signs: Temp: 97.9 F (36.6 C) (12/11 1152) BP: 177/79 mmHg (12/11 1152) Pulse Rate: 70  (12/11 1152) Intake/Output from previous day:   Intake/Output from this shift: Total I/O In: 2955 [I.V.:2505; Other:450] Out: 265 [Urine:225; Blood:40]  Labs: No results found for this basename: WBC:3,HGB:3,PLT:3,LABCREA:3,CREATININE:3 in the last 72 hours The CrCl is unknown because both a height and weight (above a minimum accepted value) are required for this calculation. No results found for this basename: VANCOTROUGH:2,VANCOPEAK:2,VANCORANDOM:2,GENTTROUGH:2,GENTPEAK:2,GENTRANDOM:2,TOBRATROUGH:2,TOBRAPEAK:2,TOBRARND:2,AMIKACINPEAK:2,AMIKACINTROU:2,AMIKACIN:2, in the last 72 hours   Microbiology: Recent Results (from the past 720 hour(s))  SURGICAL PCR SCREEN     Status: Abnormal   Collection Time   04/22/11 12:18 PM      Component Value Range Status Comment   MRSA, PCR NEGATIVE  NEGATIVE  Final    Staphylococcus aureus SAMPLE NOT SCREENED FOR [facility] AUREUS (*) NEGATIVE  Final     Medical History: Past Medical History  Diagnosis Date  . Diabetes mellitus   . GERD (gastroesophageal reflux disease)   . Hyperlipidemia   . Dysrhythmia   . Blood transfusion     1980  . Hypertension     eccho and stress 4/10 reports on chart, EKG ` LOV 9/12 on chart    Medications:  Scheduled:    . acetaminophen  1,000 mg Intravenous Q6H  . amLODipine  10 mg Oral Q0700  . ampicillin-sulbactam (UNASYN) IV  1.5 g Intravenous Once  . ampicillin-sulbactam (UNASYN) IV  1.5 g Intravenous Q6H  . benazepril  40 mg Oral Q0700  . cloNIDine  0.2 mg Oral QHS  . gentamicin  80 mg Intravenous Once  . insulin aspart  0-15 Units Subcutaneous Q4H  . metFORMIN  1,000 mg Oral BID WC  .  metoprolol  50 mg Oral BID  . simvastatin  5 mg Oral q1800  . Travoprost (BAK Free)  1 drop Both Eyes QHS  . triamterene-hydrochlorothiazide  1.5 each Oral Daily   Infusions:    . 0.45 % NaCl with KCl 20 mEq / L    . DISCONTD: lactated ringers     PRN: HYDROcodone-acetaminophen, morphine, pantoprazole, DISCONTD: estradiol, DISCONTD: lidocaine-EPINEPHrine, DISCONTD: meperidine, DISCONTD: morphine, DISCONTD: polymyxin / bacitracin (DOUBLE ANTIBIOTIC) irrigation, DISCONTD: promethazine, DISCONTD: sterile water Assessment: 70 yo F s/p vault prolapse repair; cystocele repair plus graft; rectocele repair; cystoscopy. Order to start low-dose gentamicin for synergy. SCr = 0.81 on 04/22/11. Will use wt of 80.7kg and dose 5mg /kg for urology synergy indication. Pt also on Unasyn 1.5g IV q6h. Gentamicin 80mg  IV given at 7:30am today. Will order 10 hour gent level due to age > 18 yo to assess renal function using extended-interval gent dosing.  Goal of Therapy:  Dose adjustments based on [place] Nomogram  Plan:  1)  Gentamicin 400mg  IV q24h 2)  10-hour gentamicin level,  Dose adjustment PRN per [facility]  [place], Cathlean Cower 04/26/2011,12:21 PM

## 2011-04-27 LAB — GLUCOSE, CAPILLARY
Glucose-Capillary: 113 mg/dL — ABNORMAL HIGH (ref 70–99)
Glucose-Capillary: 135 mg/dL — ABNORMAL HIGH (ref 70–99)
Glucose-Capillary: 140 mg/dL — ABNORMAL HIGH (ref 70–99)
Glucose-Capillary: 162 mg/dL — ABNORMAL HIGH (ref 70–99)
Glucose-Capillary: 186 mg/dL — ABNORMAL HIGH (ref 70–99)

## 2011-04-27 LAB — CBC
HCT: 36.8 % (ref 36.0–46.0)
Hemoglobin: 12.8 g/dL (ref 12.0–15.0)
MCH: 28.3 pg (ref 26.0–34.0)
MCHC: 34.8 g/dL (ref 30.0–36.0)
MCV: 81.4 fL (ref 78.0–100.0)
Platelets: 197 10*3/uL (ref 150–400)
RBC: 4.52 MIL/uL (ref 3.87–5.11)
RDW: 13.5 % (ref 11.5–15.5)
WBC: 10.6 10*3/uL — ABNORMAL HIGH (ref 4.0–10.5)

## 2011-04-27 LAB — BASIC METABOLIC PANEL
BUN: 8 mg/dL (ref 6–23)
CO2: 28 mEq/L (ref 19–32)
Calcium: 8.6 mg/dL (ref 8.4–10.5)
Chloride: 94 mEq/L — ABNORMAL LOW (ref 96–112)
Creatinine, Ser: 0.68 mg/dL (ref 0.50–1.10)
GFR calc Af Amer: >90 mL/min (ref >90)
GFR calc non Af Amer: 87 mL/min — ABNORMAL LOW (ref >90)
Glucose, Bld: 115 mg/dL — ABNORMAL HIGH (ref 70–99)
Potassium: 3.2 mEq/L — ABNORMAL LOW (ref 3.5–5.1)
Sodium: 132 mEq/L — ABNORMAL LOW (ref 135–145)

## 2011-04-27 LAB — GENTAMICIN LEVEL, RANDOM: Gentamicin Rm: 1.9 ug/mL

## 2011-04-27 MED ORDER — HYDROCODONE-ACETAMINOPHEN 5-500 MG PO TABS: 1.0000 | ORAL_TABLET | Freq: Four times a day (QID) | ORAL | Status: AC | PRN

## 2011-04-27 MED ORDER — CIPROFLOXACIN HCL 250 MG PO TABS: 250.0000 mg | ORAL_TABLET | Freq: Two times a day (BID) | ORAL | Status: AC

## 2011-04-27 NOTE — Progress Notes (Signed)
ANTIBIOTIC CONSULT NOTE - INITIAL  Pharmacy Consult for Gentamicin Indication: Synergy  No Known Allergies  Patient Measurements: Height: 5\' 5"  (165.1 cm) Weight: 171 lb 4.8 oz (77.701 kg) (171.3) IBW/kg (Calculated) : 57  Weight recorded on 04/22/11 = 80.7kg Ht recorded on 04/22/11 = 165 cm (65 inches)  Vital Signs: Temp: 98.8 F (37.1 C) (12/12 1015) Temp src: Oral (12/12 1015) BP: 151/76 mmHg (12/12 1015) Pulse Rate: 63  (12/12 1015) Intake/Output from previous day: 12/11 0701 - 12/12 0700 In: 6084.2 [P.O.:500; I.V.:4734.2; IV Piggyback:400] Out: 1610 [Urine:5975; Blood:40] Intake/Output from this shift: Total I/O In: 300 [P.O.:300] Out: 1600 [Urine:1600]  Labs:  [facility] 04/27/11 0445 04/26/11 1554  WBC 10.6* 13.2*  HGB 12.8 12.9  PLT 197 189  LABCREA -- --  CREATININE 0.68 --   Estimated Creatinine Clearance: 68.4 ml/min (by C-G formula based on Cr of 0.68).  Basename 04/26/11 2345  VANCOTROUGH --  Leodis Binet --  Drue Dun --  GENTTROUGH --  GENTPEAK --  GENTRANDOM 1.9  TOBRATROUGH --  TOBRAPEAK --  TOBRARND --  AMIKACINPEAK --  AMIKACINTROU --  AMIKACIN --     Microbiology: Recent Results (from the past 720 hour(s))  SURGICAL PCR SCREEN     Status: Abnormal   Collection Time   04/22/11 12:18 PM      Component Value Range Status Comment   MRSA, PCR NEGATIVE  NEGATIVE  Final    Staphylococcus aureus SAMPLE NOT SCREENED FOR [facility] AUREUS (*) NEGATIVE  Final     Medical History: Past Medical History  Diagnosis Date  . Diabetes mellitus   . GERD (gastroesophageal reflux disease)   . Hyperlipidemia   . Dysrhythmia   . Blood transfusion     1980  . Hypertension     eccho and stress 4/10 reports on chart, EKG ` LOV 9/12 on chart    Medications:  Scheduled:     . acetaminophen  1,000 mg Intravenous Q6H  . amLODipine  10 mg Oral Q0700  . ampicillin-sulbactam (UNASYN) IV  1.5 g Intravenous Q6H  . benazepril  40 mg Oral Q0700  . cloNIDine   0.2 mg Oral QHS  . gentamicin  400 mg Intravenous Q24H  . insulin aspart  0-15 Units Subcutaneous Q4H  . metFORMIN  1,000 mg Oral BID WC  . metoprolol  50 mg Oral BID  . simvastatin  5 mg Oral q1800  . Travoprost (BAK Free)  1 drop Both Eyes QHS  . triamterene-hydrochlorothiazide  1.5 each Oral Daily  . DISCONTD: acetaminophen  1,000 mg Intravenous Q6H   Infusions:     . 0.45 % NaCl with KCl 20 mEq / L 125 mL/hr at 04/27/11 0748   PRN: HYDROcodone-acetaminophen, morphine, pantoprazole Assessment: 70 yo F s/p vault prolapse repair; cystocele repair plus graft; rectocele repair; cystoscopy. Order to start low-dose gentamicin for synergy.   Gent level drawn approx 9 hours after dose given yesterday. Based on [place] Nomogram, pt is appropriately clearing extended-interval dose. Will continue current regimen until pt discharged.   Plan:  1)  Continue Gentamicin 400mg  IV q24h  Joanna Brown 04/27/2011,12:17 PM

## 2011-04-27 NOTE — Progress Notes (Signed)
Vitals normal: BP elevated/HR normal Laboratory tests normal Patient alert and stable Pain minimal and well-controlled Post treatment course discussed in detail Followup discussed in detail See orders

## 2011-04-27 NOTE — Discharge Summary (Signed)
Date of admission: 04/26/2011  Date of discharge: 04/27/2011  Admission diagnosis: Cystocele  Discharge diagnosis: Cystocele  Secondary diagnoses: Rectocele  History and Physical: For full details, please see admission history and physical. Briefly, Joanna Brown is a 71 y.o. year old patient with vault prolapse, cystocele and rectocele. SHe had prolapse surgery with a good post op course. Her pain was minimal with normal labs post op. She was discharged home the following day with appropriate f/up.  Hospital Course: uneventful  Laboratory values:  Basename 04/27/11 0445 04/26/11 1554  HGB 12.8 12.9  HCT 36.8 37.7    Basename 04/27/11 0445  CREATININE 0.68    Disposition: Home  Discharge instruction: The patient was instructed to be ambulatory but told to refrain from heavy lifting, strenuous activity, or driving.   Discharge medications:  Medication List  As of 04/27/2011  8:14 AM   START taking these medications         ciprofloxacin 250 MG tablet   Commonly known as: CIPRO   Take 1 tablet (250 mg total) by mouth 2 (two) times daily.      HYDROcodone-acetaminophen 5-500 MG per tablet   Commonly known as: VICODIN   Take 1-2 tablets by mouth every 6 (six) hours as needed for pain.         CONTINUE taking these medications         amLODipine 10 MG tablet   Commonly known as: NORVASC      B-D ULTRAFINE III SHORT PEN 31G X 8 MM Misc   Generic drug: Insulin Pen Needle   ONCE DAILY USE WITH LANTUS      benazepril 40 MG tablet   Commonly known as: LOTENSIN      cloNIDine 0.2 MG tablet   Commonly known as: CATAPRES   Take 1 tablet (0.2 mg total) by mouth at bedtime.      fish oil-omega-3 fatty acids 1000 MG capsule      insulin glargine 100 UNIT/ML injection   Commonly known as: LANTUS      lovastatin 40 MG tablet   Commonly known as: MEVACOR      metFORMIN 1000 MG tablet   Commonly known as: GLUCOPHAGE   Take 1 tablet (1,000 mg total) by mouth 2 (two)  times daily with a meal.      metoprolol 50 MG tablet   Commonly known as: LOPRESSOR   Take 1 tablet (50 mg total) by mouth 2 (two) times daily.      pantoprazole 20 MG tablet   Commonly known as: PROTONIX      Travoprost (BAK Free) 0.004 % Soln ophthalmic solution   Commonly known as: TRAVATAN      triamterene-hydrochlorothiazide 37.5-25 MG per tablet   Commonly known as: MAXZIDE-25         STOP taking these medications         aspirin 81 MG tablet          Where to get your medications    These are the prescriptions that you need to pick up.   You may get these medications from any pharmacy.         ciprofloxacin 250 MG tablet   HYDROcodone-acetaminophen 5-500 MG per tablet            Followup:  Follow-up Information    Follow up with Noela Brothers A, MD. (as scheduled)    Contact information:   [address] [facility]  [place] [address] [phone]

## 2011-04-28 ENCOUNTER — Encounter (HOSPITAL_COMMUNITY): Payer: Self-pay | Admitting: Urology

## 2011-04-29 ENCOUNTER — Other Ambulatory Visit: Payer: Self-pay | Admitting: Family Medicine

## 2011-04-30 ENCOUNTER — Observation Stay (HOSPITAL_COMMUNITY)
Admission: EM | Admit: 2011-04-30 | Discharge: 2011-05-02 | Disposition: A | Discharge status: Home / Self Care | Payer: Medicare Other | Attending: Internal Medicine | Admitting: Internal Medicine

## 2011-04-30 DIAGNOSIS — E785 Hyperlipidemia, unspecified: Secondary | ICD-10-CM | POA: Insufficient documentation

## 2011-04-30 DIAGNOSIS — Z79899 Other long term (current) drug therapy: Secondary | ICD-10-CM | POA: Insufficient documentation

## 2011-04-30 DIAGNOSIS — I959 Hypotension, unspecified: Secondary | ICD-10-CM | POA: Diagnosis present

## 2011-04-30 DIAGNOSIS — N939 Abnormal uterine and vaginal bleeding, unspecified: Secondary | ICD-10-CM | POA: Diagnosis present

## 2011-04-30 DIAGNOSIS — Z9889 Other specified postprocedural states: Secondary | ICD-10-CM | POA: Insufficient documentation

## 2011-04-30 DIAGNOSIS — N289 Disorder of kidney and ureter, unspecified: Secondary | ICD-10-CM

## 2011-04-30 DIAGNOSIS — N898 Other specified noninflammatory disorders of vagina: Principal | ICD-10-CM | POA: Insufficient documentation

## 2011-04-30 DIAGNOSIS — E876 Hypokalemia: Secondary | ICD-10-CM | POA: Insufficient documentation

## 2011-04-30 DIAGNOSIS — E119 Type 2 diabetes mellitus without complications: Secondary | ICD-10-CM | POA: Insufficient documentation

## 2011-04-30 DIAGNOSIS — K219 Gastro-esophageal reflux disease without esophagitis: Secondary | ICD-10-CM | POA: Insufficient documentation

## 2011-04-30 DIAGNOSIS — N179 Acute kidney failure, unspecified: Secondary | ICD-10-CM | POA: Insufficient documentation

## 2011-04-30 DIAGNOSIS — I10 Essential (primary) hypertension: Secondary | ICD-10-CM | POA: Insufficient documentation

## 2011-04-30 DIAGNOSIS — K7689 Other specified diseases of liver: Secondary | ICD-10-CM | POA: Insufficient documentation

## 2011-04-30 DIAGNOSIS — E860 Dehydration: Secondary | ICD-10-CM | POA: Insufficient documentation

## 2011-04-30 HISTORY — DX: Cystocele, unspecified: N81.10

## 2011-05-01 ENCOUNTER — Emergency Department (HOSPITAL_COMMUNITY): Payer: Medicare Other

## 2011-05-01 ENCOUNTER — Encounter (HOSPITAL_COMMUNITY): Payer: Self-pay | Admitting: *Deleted

## 2011-05-01 DIAGNOSIS — I959 Hypotension, unspecified: Secondary | ICD-10-CM | POA: Diagnosis present

## 2011-05-01 DIAGNOSIS — E860 Dehydration: Secondary | ICD-10-CM | POA: Diagnosis present

## 2011-05-01 DIAGNOSIS — N179 Acute kidney failure, unspecified: Secondary | ICD-10-CM | POA: Diagnosis present

## 2011-05-01 DIAGNOSIS — N939 Abnormal uterine and vaginal bleeding, unspecified: Secondary | ICD-10-CM | POA: Diagnosis present

## 2011-05-01 LAB — CBC
HCT: 34.9 % — ABNORMAL LOW (ref 36.0–46.0)
Hemoglobin: 11.6 g/dL — ABNORMAL LOW (ref 12.0–15.0)
MCH: 27.8 pg (ref 26.0–34.0)
MCHC: 33.2 g/dL (ref 30.0–36.0)
MCV: 83.7 fL (ref 78.0–100.0)
Platelets: 247 10*3/uL (ref 150–400)
RBC: 4.17 MIL/uL (ref 3.87–5.11)
RDW: 13.8 % (ref 11.5–15.5)
WBC: 8.6 10*3/uL (ref 4.0–10.5)

## 2011-05-01 LAB — DIFFERENTIAL
Basophils Absolute: 0.1 10*3/uL (ref 0.0–0.1)
Basophils Relative: 1 % (ref 0–1)
Eosinophils Absolute: 0.3 10*3/uL (ref 0.0–0.7)
Eosinophils Relative: 3 % (ref 0–5)
Lymphocytes Relative: 36 % (ref 12–46)
Lymphs Abs: 3.1 10*3/uL (ref 0.7–4.0)
Monocytes Absolute: 0.5 10*3/uL (ref 0.1–1.0)
Monocytes Relative: 6 % (ref 3–12)
Neutro Abs: 4.6 10*3/uL (ref 1.7–7.7)
Neutrophils Relative %: 54 % (ref 43–77)

## 2011-05-01 LAB — TYPE AND SCREEN
ABO/RH(D): A POS
Antibody Screen: NEGATIVE

## 2011-05-01 LAB — POCT I-STAT, CHEM 8
BUN: 28 mg/dL — ABNORMAL HIGH (ref 6–23)
Calcium, Ion: 1.09 mmol/L — ABNORMAL LOW (ref 1.12–1.32)
Chloride: 104 mEq/L (ref 96–112)
Creatinine, Ser: 2.5 mg/dL — ABNORMAL HIGH (ref 0.50–1.10)
Glucose, Bld: 131 mg/dL — ABNORMAL HIGH (ref 70–99)
HCT: 37 % (ref 36.0–46.0)
Hemoglobin: 12.6 g/dL (ref 12.0–15.0)
Potassium: 3.8 mEq/L (ref 3.5–5.1)
Sodium: 136 mEq/L (ref 135–145)
TCO2: 21 mmol/L (ref 0–100)

## 2011-05-01 LAB — URINALYSIS, ROUTINE W REFLEX MICROSCOPIC
Bilirubin Urine: NEGATIVE
Glucose, UA: NEGATIVE mg/dL
Hgb urine dipstick: NEGATIVE
Ketones, ur: NEGATIVE mg/dL
Leukocytes, UA: NEGATIVE
Nitrite: NEGATIVE
Protein, ur: NEGATIVE mg/dL
Specific Gravity, Urine: 1.011 (ref 1.005–1.030)
Urobilinogen, UA: 0.2 mg/dL (ref 0.0–1.0)
pH: 5 (ref 5.0–8.0)

## 2011-05-01 LAB — GLUCOSE, CAPILLARY
Glucose-Capillary: 121 mg/dL — ABNORMAL HIGH (ref 70–99)
Glucose-Capillary: 135 mg/dL — ABNORMAL HIGH (ref 70–99)
Glucose-Capillary: 148 mg/dL — ABNORMAL HIGH (ref 70–99)

## 2011-05-01 LAB — HEMOGLOBIN A1C
Hgb A1c MFr Bld: 7.1 % — ABNORMAL HIGH (ref <5.7)
Mean Plasma Glucose: 157 mg/dL — ABNORMAL HIGH (ref <117)

## 2011-05-01 MED ORDER — SODIUM CHLORIDE 0.9 % IV SOLN
INTRAVENOUS | Status: DC
  Administered 2011-05-01 – 2011-05-02 (×2): via INTRAVENOUS

## 2011-05-01 MED ORDER — TRAVOPROST (BAK FREE) 0.004 % OP SOLN
1.0000 [drp] | Freq: Every day | OPHTHALMIC | Status: DC
  Administered 2011-05-01: 1 [drp] via OPHTHALMIC
  Filled 2011-05-01: qty 2.5

## 2011-05-01 MED ORDER — SIMVASTATIN 20 MG PO TABS
20.0000 mg | ORAL_TABLET | Freq: Every day | ORAL | Status: DC
  Administered 2011-05-01: 20 mg via ORAL
  Filled 2011-05-01 (×2): qty 1

## 2011-05-01 MED ORDER — HYDROCODONE-ACETAMINOPHEN 5-325 MG PO TABS: 1.0000 | ORAL_TABLET | Freq: Four times a day (QID) | ORAL | Status: DC | PRN

## 2011-05-01 MED ORDER — INSULIN GLARGINE 100 UNIT/ML ~~LOC~~ SOLN
30.0000 [IU] | Freq: Every day | SUBCUTANEOUS | Status: DC
  Administered 2011-05-01: 30 [IU] via SUBCUTANEOUS
  Filled 2011-05-01: qty 3

## 2011-05-01 MED ORDER — SODIUM CHLORIDE 0.9 % IV BOLUS (SEPSIS)
1000.0000 mL | Freq: Once | INTRAVENOUS | Status: AC
  Administered 2011-05-01 (×2): 1000 mL via INTRAVENOUS

## 2011-05-01 MED ORDER — SODIUM CHLORIDE 0.9 % IV SOLN
Freq: Once | INTRAVENOUS | Status: AC
  Administered 2011-05-01: 10000 mL via INTRAVENOUS

## 2011-05-01 MED ORDER — INSULIN ASPART 100 UNIT/ML ~~LOC~~ SOLN
0.0000 [IU] | Freq: Three times a day (TID) | SUBCUTANEOUS | Status: DC
  Administered 2011-05-01: 1 [IU] via SUBCUTANEOUS
  Filled 2011-05-01: qty 3

## 2011-05-01 MED ORDER — PANTOPRAZOLE SODIUM 20 MG PO TBEC
20.0000 mg | DELAYED_RELEASE_TABLET | Freq: Every day | ORAL | Status: DC
  Administered 2011-05-01 – 2011-05-02 (×2): 20 mg via ORAL
  Filled 2011-05-01 (×2): qty 1

## 2011-05-01 MED ORDER — CIPROFLOXACIN HCL 250 MG PO TABS
250.0000 mg | ORAL_TABLET | Freq: Two times a day (BID) | ORAL | Status: DC
  Administered 2011-05-01 – 2011-05-02 (×3): 250 mg via ORAL
  Filled 2011-05-01 (×4): qty 1

## 2011-05-01 MED ORDER — ONDANSETRON HCL 4 MG/2ML IJ SOLN
4.0000 mg | Freq: Three times a day (TID) | INTRAMUSCULAR | Status: AC | PRN
  Administered 2011-05-01: 4 mg via INTRAVENOUS
  Filled 2011-05-01: qty 2

## 2011-05-01 MED ORDER — SODIUM CHLORIDE 0.9 % IV BOLUS (SEPSIS)
1000.0000 mL | Freq: Once | INTRAVENOUS | Status: AC
  Administered 2011-05-01: 1000 mL via INTRAVENOUS

## 2011-05-01 MED ORDER — SODIUM CHLORIDE 0.9 % IV SOLN
INTRAVENOUS | Status: AC
  Administered 2011-05-01: 1000 mL via INTRAVENOUS

## 2011-05-01 MED ORDER — INSULIN ASPART 100 UNIT/ML ~~LOC~~ SOLN: 0.0000 [IU] | Freq: Every day | SUBCUTANEOUS | Status: DC

## 2011-05-01 MED ORDER — ONDANSETRON HCL 4 MG/2ML IJ SOLN: 4.0000 mg | Freq: Four times a day (QID) | INTRAMUSCULAR | Status: DC | PRN

## 2011-05-01 MED ORDER — ONDANSETRON HCL 4 MG PO TABS: 4.0000 mg | ORAL_TABLET | Freq: Four times a day (QID) | ORAL | Status: DC | PRN

## 2011-05-01 NOTE — ED Notes (Signed)
Pt states that she has noticed weakness accompanied by dizziness and lethargy.  Pt states she had bladder surgery in which "they fixed the fallen bladder" on 04/26/11.  Pt states that the bleeding is vaginal and has increased from a pink tinged fluid to clots.  Pt has used 3 panty liners since 1800 on 04/30/11.

## 2011-05-01 NOTE — H&P (Signed)
PCP:  Syliva Overman, MD, MD   DOA:  04/30/2011 11:54 PM  Chief Complaint:  Vaginal bleed  HPI: 70 years old African American woman who had bladder tack done on December 13 by Dr. McDiarmid  . He has per her since the procedure she she was having difficulty urinating and described  It as dripping associated with burning. She denies any fever of chills. Yesterday while she was urinating she passed a large clot of blood and then continued oozing . She felt weak and dizzy and came to the ED. In the ED she was found to be hypotensive with blood pressure 80s over 40s. She was given more than 3 L of fluids and have blood pressure improved to 100s over 50s now. Her hemoglobin was checked and found to be stable at 11.6.  Allergies: No Known Allergies  Prior to Admission medications   Medication Sig Start Date End Date Taking? Authorizing Provider  amLODipine (NORVASC) 10 MG tablet Take 10 mg by mouth every morning.  02/07/11  Yes Syliva Overman, MD  B-D ULTRAFINE III SHORT PEN 31G X 8 MM MISC ONCE DAILY USE WITH LANTUS 04/04/11  Yes Syliva Overman, MD  benazepril (LOTENSIN) 40 MG tablet Take 40 mg by mouth every morning.  02/07/11  Yes Syliva Overman, MD  ciprofloxacin (CIPRO) 250 MG tablet Take 1 tablet (250 mg total) by mouth 2 (two) times daily. 04/27/11 05/07/11 Yes Scott A MacDiarmid, MD  cloNIDine (CATAPRES) 0.2 MG tablet TAKE ONE TABLET BY MOUTH NIGHTLY AT BEDTIME 04/29/11  Yes Syliva Overman, MD  HYDROcodone-acetaminophen (VICODIN) 5-500 MG per tablet Take 1-2 tablets by mouth every 6 (six) hours as needed for pain. 04/27/11 05/07/11 Yes Scott A MacDiarmid, MD  insulin glargine (LANTUS) 100 UNIT/ML injection Inject 30 Units into the skin at bedtime.    Yes Historical Provider, MD  lovastatin (MEVACOR) 40 MG tablet Take 40 mg by mouth at bedtime.  02/07/11 02/07/12 Yes Syliva Overman, MD  metFORMIN (GLUCOPHAGE) 1000 MG tablet Take 1 tablet (1,000 mg total) by mouth 2 (two) times daily  with a meal. 02/07/11  Yes Syliva Overman, MD  metoprolol (LOPRESSOR) 50 MG tablet Take 1 tablet (50 mg total) by mouth 2 (two) times daily. 02/07/11  Yes Syliva Overman, MD  pantoprazole (PROTONIX) 20 MG tablet Take 20 mg by mouth daily as needed. HEART BURN  02/07/11 02/07/12 Yes Syliva Overman, MD  Travoprost, BAK Free, (TRAVATAN) 0.004 % SOLN ophthalmic solution Place 1 drop into both eyes at bedtime.    Yes Historical Provider, MD  triamterene-hydrochlorothiazide (MAXZIDE-25) 37.5-25 MG per tablet Take 1.5 tablets by mouth daily.  02/07/11  Yes Syliva Overman, MD    Past Medical History  Diagnosis Date  . Diabetes mellitus   . GERD (gastroesophageal reflux disease)   . Hyperlipidemia   . Dysrhythmia   . Blood transfusion     1980  . Hypertension     eccho and stress 4/10 reports on chart, EKG ` LOV 9/12 on chart  . Female bladder prolapse     Past Surgical History  Procedure Date  . Abdominal hysterectomy   . Ovarian tumors removed bilateral   . Thyroidectomy   . Lymph nodes removed under right arm     right  . Cholecystectomy   . Thyroidectomy   . Colonoscopy w/ polypectomy   . Eye surgery     bilateral cataract extraction with IOL  . Anterior and posterior repair 04/26/2011    Procedure: ANTERIOR (CYSTOCELE) AND  POSTERIOR REPAIR (RECTOCELE);  Surgeon: Martina Sinner, MD;  Location: [facility];  Service: Urology;  Laterality: N/A;  . Vaginal prolapse repair 04/26/2011    Procedure: VAGINAL VAULT SUSPENSION;  Surgeon: Martina Sinner, MD;  Location: [facility];  Service: Urology;  Laterality: N/A;  with Graft  10x6    Social History:   Family History  Problem Relation Age of Onset  . Heart disease Mother     MI  . Cancer Mother   . Heart disease Father     MI  . Cancer Sister     liver  . Cancer Sister     bone    Review of Systems:  Constitutional: Denies fever, chills, diaphoresis, appetite change and fatigue.  HEENT: Denies photophobia, eye pain,  redness, hearing loss, ear pain, congestion, sore throat, rhinorrhea, sneezing, mouth sores, trouble swallowing, neck pain, neck stiffness and tinnitus.   Respiratory: Denies SOB, DOE, cough, chest tightness,  and wheezing.   Cardiovascular: Denies chest pain, palpitations and leg swelling.  Gastrointestinal:c/o mild nausea  Denies, vomiting, abdominal pain, diarrhea, constipation, blood in stool and abdominal distention.  Genitourinary:C/O  Dysuria and dripping ,vaginal bleed urgency, frequency, hematuria, flank pain and difficulty urinating.  Musculoskeletal: Denies myalgias, back pain, joint swelling, arthralgias and gait problem.  Skin: Denies pallor, rash and wound.  Neurological: Denies dizziness, seizures, syncope, weakness, light-headedness, numbness and headaches.   Physical Exam:  Filed Vitals:   05/01/11 0258 05/01/11 0259 05/01/11 0330 05/01/11 0734  BP: 98/49 92/50 94/49  110/57  Pulse: 58 58 57 63  Temp:    98.3 F (36.8 C)  TempSrc:    Oral  Resp: 15   16  SpO2: 100% 100% 98% 99%   Constitutional: Vital signs reviewed.  Patient  in no acute distress and cooperative with exam. Alert and oriented x3.  Neck: Supple, Trachea midline normal ROM, No JVD, mass, thyromegaly, or carotid bruit present.  Cardiovascular: RRR, S1 normal, S2 normal pulses  Pulmonary/Chest: CTAB, no wheezes, rales, or rhonchi Abdominal: Soft. Non-tender, non-distended, bowel sounds are normal, no masses, organomegaly, or guarding present.  GU: no CVA tenderness Ext: no edema and no cyanosis, pulses palpable bilaterally  Neurological: A&O x3, Strenght is normal and symmetric bilaterally, cranial nerve II-XII are grossly intact, no focal motor deficit, sensory intact to light touch bilaterally.   Labs on Admission:  Results for orders placed during the hospital encounter of 04/30/11 (from the past 48 hour(s))  CBC     Status: Abnormal   Collection Time   05/01/11 12:42 AM      Component Value Range  Comment   WBC 8.6  4.0 - 10.5 (K/uL)    RBC 4.17  3.87 - 5.11 (MIL/uL)    Hemoglobin 11.6 (*) 12.0 - 15.0 (g/dL)    HCT 16.1 (*) 09.6 - 46.0 (%)    MCV 83.7  78.0 - 100.0 (fL)    MCH 27.8  26.0 - 34.0 (pg)    MCHC 33.2  30.0 - 36.0 (g/dL)    RDW 04.5  40.9 - 81.1 (%)    Platelets 247  150 - 400 (K/uL)   DIFFERENTIAL     Status: Normal   Collection Time   05/01/11 12:42 AM      Component Value Range Comment   Neutrophils Relative 54  43 - 77 (%)    Neutro Abs 4.6  1.7 - 7.7 (K/uL)    Lymphocytes Relative 36  12 - 46 (%)  Lymphs Abs 3.1  0.7 - 4.0 (K/uL)    Monocytes Relative 6  3 - 12 (%)    Monocytes Absolute 0.5  0.1 - 1.0 (K/uL)    Eosinophils Relative 3  0 - 5 (%)    Eosinophils Absolute 0.3  0.0 - 0.7 (K/uL)    Basophils Relative 1  0 - 1 (%)    Basophils Absolute 0.1  0.0 - 0.1 (K/uL)   TYPE AND SCREEN     Status: Normal   Collection Time   05/01/11 12:42 AM      Component Value Range Comment   ABO/RH(D) A POS      Antibody Screen NEG      Sample Expiration 05/04/2011     POCT I-STAT, CHEM 8     Status: Abnormal   Collection Time   05/01/11 12:49 AM      Component Value Range Comment   Sodium 136  135 - 145 (mEq/L)    Potassium 3.8  3.5 - 5.1 (mEq/L)    Chloride 104  96 - 112 (mEq/L)    BUN 28 (*) 6 - 23 (mg/dL)    Creatinine, Ser 1.61 (*) 0.50 - 1.10 (mg/dL)    Glucose, Bld 096 (*) 70 - 99 (mg/dL)    Calcium, Ion 0.45 (*) 1.12 - 1.32 (mmol/L)    TCO2 21  0 - 100 (mmol/L)    Hemoglobin 12.6  12.0 - 15.0 (g/dL)    HCT 40.9  81.1 - 91.4 (%)   URINALYSIS, ROUTINE W REFLEX MICROSCOPIC     Status: Normal   Collection Time   05/01/11  1:46 AM      Component Value Range Comment   Color, Urine YELLOW  YELLOW     APPearance CLEAR  CLEAR     Specific Gravity, Urine 1.011  1.005 - 1.030     pH 5.0  5.0 - 8.0     Glucose, UA NEGATIVE  NEGATIVE (mg/dL)    Hgb urine dipstick NEGATIVE  NEGATIVE     Bilirubin Urine NEGATIVE  NEGATIVE     Ketones, ur NEGATIVE  NEGATIVE  (mg/dL)    Protein, ur NEGATIVE  NEGATIVE (mg/dL)    Urobilinogen, UA 0.2  0.0 - 1.0 (mg/dL)    Nitrite NEGATIVE  NEGATIVE     Leukocytes, UA NEGATIVE  NEGATIVE  MICROSCOPIC NOT DONE ON URINES WITH NEGATIVE PROTEIN, BLOOD, LEUKOCYTES, NITRITE, OR GLUCOSE <1000 mg/dL.    Radiological Exams on Admission: CT abdomen and pelvis  IMPRESSION:  Circumferential bladder wall thickening, nonspecific given  decompression with Foley catheter in place.  Small amount of free intraperitoneal fluid measures water  attenuation.  Hepatic steatosis.    Assessment/Plan Vaginal bleed Hypotension , improved Dehydration Acute renal failure  Diabetes Mellitus  Plan Patient will be admitted for observation Her vaginal bleed is minimal, hemoglobin is stable. Dr. Annabell Howells from urology was consulted Her renal failure most likely secondary to dehydration, ATN cannot be ruled out given hypotensive episodes. Will continue with IV fluids, hold diuretics , enalapril and metformin. CT abdomen showed no obstructive uropathy. For  diabetes will hold metformin continue with Lantus insulin and place on insulin sliding scale.     Time Spent on Admission: 50 minutes  Augustina Braddock 05/01/2011, 8:07 AM

## 2011-05-01 NOTE — Consult Note (Signed)
Urology Consult  Reason for referral: Vaginal bleeding after recent surgery.  History of Present Illness: This patient is a very pleasant 70 year old female who underwent surgery 5 days ago by Dr. Sherron Monday. She had vaginal prolapse and underwent anterior and posterior repair with no complication. She was observed overnight and reported no significant vaginal bleeding and was discharged the following morning in good condition. She presented to the emergency room with vaginal bleeding today and reports that the day she was discharged from the hospital she had a slightly pink discharge from the vagina. This morning when she awoke she passed a blood clot that she described as the size of a quarter. Since that time she has had just some slightly pink discharge but no active bleeding. She slightly sore in the suprapubic region but has no increasing pain. She was found to have an elevated creatinine of 2.5 with a baseline creatinine 4 days ago of 0.68. Her BUN was elevated at 28. In addition she was found to have a hemoglobin of 11.6 with a hematocrit of 34.9 and 4 days previously her hemoglobin was 12 with a hematocrit of 36.8. She indicates that she has no flank pain and actually feels quite fine. Her vaginal bleeding would be considered mild severity with no associated signs or symptoms or aggravating or alleviating factors.   Past Medical History  Diagnosis Date  . Diabetes mellitus   . GERD (gastroesophageal reflux disease)   . Hyperlipidemia   . Dysrhythmia   . Blood transfusion     1980  . Hypertension     eccho and stress 4/10 reports on chart, EKG ` LOV 9/12 on chart  . Female bladder prolapse    Past Surgical History  Procedure Date  . Abdominal hysterectomy   . Ovarian tumors removed bilateral   . Thyroidectomy   . Lymph nodes removed under right arm     right  . Cholecystectomy   . Thyroidectomy   . Colonoscopy w/ polypectomy   . Eye surgery     bilateral cataract extraction with  IOL  . Anterior and posterior repair 04/26/2011    Procedure: ANTERIOR (CYSTOCELE) AND POSTERIOR REPAIR (RECTOCELE);  Surgeon: Martina Sinner, MD;  Location: [facility];  Service: Urology;  Laterality: N/A;  . Vaginal prolapse repair 04/26/2011    Procedure: VAGINAL VAULT SUSPENSION;  Surgeon: Martina Sinner, MD;  Location: [facility];  Service: Urology;  Laterality: N/A;  with Graft  10x6    Medications: see hospital chart  Allergies: No Known Allergies  Family History  Problem Relation Age of Onset  . Heart disease Mother     MI  . Cancer Mother   . Heart disease Father     MI  . Cancer Sister     liver  . Cancer Sister     bone    Social History:  reports that she has never smoked. She has never used smokeless tobacco. She reports that she drinks alcohol. She reports that she does not use illicit drugs.  Review of Systems: Pertinent items are noted in HPI. A comprehensive review of systems was negative otherwise.   Physical Exam:  Vital signs in last 24 hours: Temp:  [96 F (35.6 C)-98.4 F (36.9 C)] 98.3 F (36.8 C) (12/16 0734) Pulse Rate:  [57-64] 63  (12/16 0734) Resp:  [15-18] 16  (12/16 0734) BP: (82-110)/(48-57) 110/57 mmHg (12/16 0734) SpO2:  [98 %-100 %] 99 % (12/16 0734) General appearance: alert and appears stated age  Head: Normocephalic, without obvious abnormality, atraumatic Eyes: conjunctivae/corneas clear. EOM's intact.  Oropharynx: moist mucous membranes Neck: supple, symmetrical, trachea midline Resp: normal respiratory effort Cardio: regular rate and rhythm Back: symmetric, no curvature. ROM normal. No CVA tenderness. GI: soft, non-tender; bowel sounds normal; no masses,  no organomegaly GU: She has normal external female genitalia with no significant bleeding noted. Her incision is intact. Extremities: extremities normal, atraumatic, no cyanosis or edema Skin: Skin color normal. No visible rashes or lesions Neurologic: Grossly  normal  Laboratory Data:   Basename 05/01/11 0049 05/01/11 0042  WBC -- 8.6  HGB 12.6 11.6*  HCT 37.0 34.9*   BMET  Basename 05/01/11 0049  NA 136  K 3.8  CL 104  CO2 --  GLUCOSE 131*  BUN 28*  CREATININE 2.50*  CALCIUM --   No results found for this basename: LABPT:3,INR:3 in the last 72 hours No results found for this basename: LABURIN:1 in the last 72 hours Results for orders placed during the hospital encounter of 04/22/11  SURGICAL PCR SCREEN     Status: Abnormal   Collection Time   04/22/11 12:18 PM      Component Value Range Status Comment   MRSA, PCR NEGATIVE  NEGATIVE  Final    Staphylococcus aureus SAMPLE NOT SCREENED FOR [facility] (*) NEGATIVE  Final    Creatinine:  Basename 05/01/11 0049 04/27/11 0445  CREATININE 2.50* 0.68    Imaging: Ct Abdomen Pelvis Wo Contrast  05/01/2011  *RADIOLOGY REPORT*  Clinical Data: Bleeding, recent bladder prolapse surgery.  CT ABDOMEN AND PELVIS WITHOUT CONTRAST  Technique:  Multidetector CT imaging of the abdomen and pelvis was performed following the standard protocol without intravenous contrast.  Comparison: None.  Findings: Lung bases are clear.  Intra-abdominal organ evaluation is limited without intravenous contrast.  Within this limitation, low attenuation of the liver suggests fatty infiltration.  Status post cholecystectomy. Unremarkable spleen, pancreas, adrenal glands, kidneys.  No hydronephrosis or hydroureter.  No bowel obstruction.  No CT evidence for colitis.  There is a small amount of free fluid, perihepatic and within the pelvis. This is nonspecific.  Status post hysterectomy.  Foley catheter within a decompressed bladder.  Bladder wall thickening is nonspecific given incomplete distension.  Normal caliber vasculature with scattered atherosclerotic calcification.  Multilevel degenerative changes without acute osseous abnormality.  IMPRESSION: Circumferential bladder wall thickening, nonspecific given  decompression with Foley catheter in place.  Small amount of free intraperitoneal fluid measures water attenuation.  Hepatic steatosis.  Original Report Authenticated By: Waneta Martins, M.D.   Dg Chest Port 1 View  05/01/2011  *RADIOLOGY REPORT*  Clinical Data: Tachycardia, recent surgery.  PORTABLE CHEST - 1 VIEW  Comparison: 04/22/2011 radiograph  Findings: Mild left lower lobe opacity is likely atelectasis. Lungs are otherwise clear. No pleural effusion or pneumothorax. The cardiomediastinal contours are within normal limits. The visualized bones and soft tissues are without significant appreciable abnormality.  IMPRESSION: Mild left lower lobe opacity is favored to represent atelectasis. Otherwise, no acute cardiopulmonary process.  Original Report Authenticated By: Waneta Martins, M.D.    Impression/Assessment:  1. Vaginal bleeding status post anterior and posterior repair. The amount of bleeding described by the patient is minimal at best and of no clinical significance. The passage of a single quarter size clot is of no concern as she is not having any active bleeding at this time. In addition her hemoglobin and hematocrit have remained stable. From the standpoint no further evaluation or treatment is  currently necessary.  2. I was concerned about the fact that she had an elevated creatinine. Having had an anterior repair the concern was that of bilateral ureteral obstruction and therefore a CT scan was obtained and the images were independently reviewed. She had no evidence of hydronephrosis. There was some thickening of the bladder wall is to be expected after recent surgery but there was no pelvic hematoma or other concerning findings. Her BUN is elevated indicating some degree of dehydration although the degree of creatinine elevation does seem slightly more than one would expect with the hydration alone.    Plan:  1. Observation only for her vaginal bleeding. 2. With no evidence of  ureteral obstruction further evaluation of her elevated creatinine would be indicated if he does not respond to hydration alone. 3. I will inform Dr. Sherron Monday of her admission.   Caelin Rayl C 05/01/2011, 8:55 AM

## 2011-05-01 NOTE — ED Provider Notes (Signed)
Medical screening examination/treatment/procedure(s) were conducted as a shared visit with non-physician practitioner(s) and myself.  I personally evaluated the patient during the encounter   70 year old female presents with small amount of vaginal bleeding, lightheadedness, mild dysuria. Had recent bladder tack procedure   Physical exam: Abdomen soft, mildly tender in the suprapubic and right lower quadrant area, non-peritoneal, no other tenderness. Lungs clear, heart regular at 60 beats per minute, hypotensive at 85 systolic, no edema, normal mental status and speech   Assessment: Labs confirm acute renal failure with creatinine of 2.5, Will Place, Foley catheter for urine sample and urine output monitoring, rectal temperature, rule out urinary infection, admit   Vida Roller, MD 05/01/11 2304

## 2011-05-01 NOTE — ED Notes (Signed)
MD at bedside. 

## 2011-05-01 NOTE — ED Notes (Signed)
Foley 14 Fr inserted

## 2011-05-01 NOTE — ED Notes (Signed)
Pt reports she had a bladder prolapse surgery Tuesday and has since had bleeding. Pt reports bleeding was light at first and then bleeding became heavier with clots. Pt reports dizziness has increased

## 2011-05-01 NOTE — ED Provider Notes (Signed)
History     CSN: [id number] Arrival date & time: 04/30/2011 11:54 PM   First MD Initiated Contact with Patient 05/01/11 0012      Chief Complaint  Patient presents with  . Vaginal Bleeding    S/P transvaginal bladder surgery 04/26/11    (Consider location/radiation/quality/duration/timing/severity/associated sxs/prior treatment) HPI Comments: Ms. Enis Slipper is states that she had a bladder tack 5 days ago seeking while in the restroom passed a large clot.  This is a $0.50 piece and has been oozing since she became weak and dizzy at the same time.  Denies nausea, vomiting, diarrhea.  He states the last time she urinated was about 8:30.  Has not called her surgeon  Patient is a 70 y.o. female presenting with vaginal bleeding. The history is provided by the patient.  Vaginal Bleeding This is a new problem. The current episode started today. The problem occurs constantly. The problem has been unchanged. Associated symptoms include weakness. Pertinent negatives include no abdominal pain, fever, urinary symptoms or vomiting.    Past Medical History  Diagnosis Date  . Diabetes mellitus   . GERD (gastroesophageal reflux disease)   . Hyperlipidemia   . Dysrhythmia   . Blood transfusion     1980  . Hypertension     eccho and stress 4/10 reports on chart, EKG ` LOV 9/12 on chart  . Female bladder prolapse     Past Surgical History  Procedure Date  . Abdominal hysterectomy   . Ovarian tumors removed bilateral   . Thyroidectomy   . Lymph nodes removed under right arm     right  . Cholecystectomy   . Thyroidectomy   . Colonoscopy w/ polypectomy   . Eye surgery     bilateral cataract extraction with IOL  . Anterior and posterior repair 04/26/2011    Procedure: ANTERIOR (CYSTOCELE) AND POSTERIOR REPAIR (RECTOCELE);  Surgeon: Martina Sinner, MD;  Location: [facility];  Service: Urology;  Laterality: N/A;  . Vaginal prolapse repair 04/26/2011    Procedure: VAGINAL VAULT SUSPENSION;   Surgeon: Martina Sinner, MD;  Location: [facility];  Service: Urology;  Laterality: N/A;  with Graft  10x6    Family History  Problem Relation Age of Onset  . Heart disease Mother     MI  . Cancer Mother   . Heart disease Father     MI  . Cancer Sister     liver  . Cancer Sister     bone    History  Substance Use Topics  . Smoking status: Never Smoker   . Smokeless tobacco: Never Used  . Alcohol Use: Yes     socially- none x 30 years    OB History    Grav Para Term Preterm Abortions TAB SAB Ect Mult Living                  Review of Systems  Constitutional: Negative for fever.  HENT: Negative.   Eyes: Negative.   Respiratory: Negative.   Cardiovascular: Negative.   Gastrointestinal: Negative for vomiting and abdominal pain.  Genitourinary: Positive for vaginal bleeding. Negative for dysuria and vaginal pain.  Neurological: Positive for dizziness and weakness.  Hematological: Negative.   Psychiatric/Behavioral: Negative.     Allergies  Review of patient's allergies indicates no known allergies.  Home Medications   Current Outpatient Rx  Name Route Sig Dispense Refill  . AMLODIPINE BESYLATE 10 MG PO TABS Oral Take 10 mg by mouth every morning.     Marland Kitchen  BD PEN NEEDLE SHORT U/F 31G X 8 MM MISC  ONCE DAILY USE WITH LANTUS 100 each 2  . BENAZEPRIL HCL 40 MG PO TABS Oral Take 40 mg by mouth every morning.     Marland Kitchen CIPROFLOXACIN HCL 250 MG PO TABS Oral Take 1 tablet (250 mg total) by mouth 2 (two) times daily. 10 tablet 0  . CLONIDINE HCL 0.2 MG PO TABS Oral Take 1 tablet (0.2 mg total) by mouth at bedtime. 90 tablet 1  . CLONIDINE HCL 0.2 MG PO TABS  TAKE ONE TABLET BY MOUTH NIGHTLY AT BEDTIME 90 tablet 1  . OMEGA-3 FATTY ACIDS 1000 MG PO CAPS Oral Take 2 g by mouth daily.     Marland Kitchen HYDROCODONE-ACETAMINOPHEN 5-500 MG PO TABS Oral Take 1-2 tablets by mouth every 6 (six) hours as needed for pain. 40 tablet 0  . INSULIN GLARGINE 100 UNIT/ML [place] SOLN Subcutaneous Inject 30 Units  into the skin at bedtime.     Marland Kitchen LOVASTATIN 40 MG PO TABS Oral Take 40 mg by mouth at bedtime.     Marland Kitchen METFORMIN HCL 1000 MG PO TABS Oral Take 1 tablet (1,000 mg total) by mouth 2 (two) times daily with a meal. 180 tablet 1  . METOPROLOL TARTRATE 50 MG PO TABS Oral Take 1 tablet (50 mg total) by mouth 2 (two) times daily. 180 tablet 1  . PANTOPRAZOLE SODIUM 20 MG PO TBEC Oral Take 20 mg by mouth daily as needed. HEART BURN     . TRAVOPROST (BAK FREE) 0.004 % OP SOLN Both Eyes Place 1 drop into both eyes at bedtime.     . TRIAMTERENE-HCTZ 37.5-25 MG PO TABS Oral Take 1.5 tablets by mouth daily.       BP 82/48  Pulse 64  Temp(Src) 98.4 F (36.9 C) (Oral)  Resp 18  SpO2 98%  Physical Exam  Constitutional: She appears well-developed and well-nourished.  HENT:  Head: Normocephalic.  Eyes: Pupils are equal, round, and reactive to light.  Neck: Normal range of motion.  Cardiovascular: Normal rate.        hypotensive  Pulmonary/Chest: Effort normal.  Abdominal: Soft. Bowel sounds are normal.  Genitourinary: Vagina normal.       Small amount of bright red blood at vaginal opening using sterile gloves and sterile speculum suture line tintack, small amount oozing blood no clots in the vaginal vault minimal discomfort  Neurological: She is alert.  Skin: Skin is warm.  Psychiatric: She has a normal mood and affect.    ED Course  Procedures (including critical care time)   Labs Reviewed  CBC  DIFFERENTIAL  I-STAT, CHEM 8  TYPE AND SCREEN   No results found.   No diagnosis found.  Small amount of blood at the vaginal opening on initial exam with sterile speculum and sterile gloves.  Vaginal vault examined.  Small amount of oozing blood.  No clots.  Suture line intact.  Minimal discomfort.  Patient is hypotensive.  Will give 1 L normal saline obtain CBC, type and cross contact her surgeon, Scot MacDermidt  MDM  Wound dehiscent.        Arman Filter, NP 05/01/11 [phone]

## 2011-05-01 NOTE — ED Notes (Signed)
Assumed care of patient at this time

## 2011-05-01 NOTE — Progress Notes (Signed)
I spoke with Dr Vernie Ammons tonight and reviewed the chart and CT Scan Elevated serum Cr of 2.5 is surprising; based on labs patient has not been bleeding  The prolapse repair went well; there was good efflux bilaterally from the ureters of blue urine with no distortion of the trigone; the patient never complained of unilateral or bilateral back/flank pain postop She was having some trouble emptying efficiently post op with PVR's on Friday around 300 mL; she felt fine with the elevated PVR and a SLING had not been performed CT scan shows no hydronephrosis Except in the face of severe dehydration the CT scan has ruled out post renal causes of elevated serum Cr Plan: hydrate/follow serum Cr/ will assess tomorrow am Dr Sherron Monday

## 2011-05-01 NOTE — ED Notes (Signed)
Resting quietly in bed. No complaints at this time. No acute distress.

## 2011-05-02 ENCOUNTER — Encounter: Payer: Self-pay | Admitting: Family Medicine

## 2011-05-02 LAB — CBC
HCT: 31.8 % — ABNORMAL LOW (ref 36.0–46.0)
Hemoglobin: 10.7 g/dL — ABNORMAL LOW (ref 12.0–15.0)
MCH: 27.8 pg (ref 26.0–34.0)
MCHC: 33.6 g/dL (ref 30.0–36.0)
MCV: 82.6 fL (ref 78.0–100.0)
Platelets: 183 10*3/uL (ref 150–400)
RBC: 3.85 MIL/uL — ABNORMAL LOW (ref 3.87–5.11)
RDW: 13.7 % (ref 11.5–15.5)
WBC: 6.4 10*3/uL (ref 4.0–10.5)

## 2011-05-02 LAB — BASIC METABOLIC PANEL
BUN: 18 mg/dL (ref 6–23)
CO2: 30 mEq/L (ref 19–32)
Calcium: 9.8 mg/dL (ref 8.4–10.5)
Chloride: 103 mEq/L (ref 96–112)
Creatinine, Ser: 1.17 mg/dL — ABNORMAL HIGH (ref 0.50–1.10)
GFR calc Af Amer: 54 mL/min — ABNORMAL LOW (ref >90)
GFR calc non Af Amer: 46 mL/min — ABNORMAL LOW (ref >90)
Glucose, Bld: 111 mg/dL — ABNORMAL HIGH (ref 70–99)
Potassium: 3.2 mEq/L — ABNORMAL LOW (ref 3.5–5.1)
Sodium: 141 mEq/L (ref 135–145)

## 2011-05-02 LAB — GLUCOSE, CAPILLARY: Glucose-Capillary: 106 mg/dL — ABNORMAL HIGH (ref 70–99)

## 2011-05-02 MED ORDER — POTASSIUM CHLORIDE CRYS ER 20 MEQ PO TBCR
40.0000 meq | EXTENDED_RELEASE_TABLET | ORAL | Status: AC
  Administered 2011-05-02: 40 meq via ORAL
  Filled 2011-05-02: qty 2

## 2011-05-02 MED ORDER — POTASSIUM CHLORIDE ER 10 MEQ PO TBCR: 20.0000 meq | EXTENDED_RELEASE_TABLET | Freq: Every day | ORAL | Status: DC

## 2011-05-02 MED ORDER — INSULIN ASPART 100 UNIT/ML ~~LOC~~ SOLN: 0.0000 [IU] | Freq: Three times a day (TID) | SUBCUTANEOUS | Status: DC

## 2011-05-02 MED ORDER — INSULIN GLARGINE 100 UNIT/ML ~~LOC~~ SOLN: 35.0000 [IU] | Freq: Every day | SUBCUTANEOUS | Status: DC

## 2011-05-02 MED ORDER — INSULIN SYRINGES (DISPOSABLE) U-100 0.3 ML MISC: 1.0000 | Status: DC | PRN

## 2011-05-02 NOTE — Progress Notes (Signed)
Only noted bright red blood around groin area @ the start of my shift, cleaned patient, at the end of my shift no blood noted. Patient denies any pain or distress. Foley draining clear yellow urine.

## 2011-05-02 NOTE — Progress Notes (Signed)
Patient looks much better today Vitals normal Urine output high when patient came to hospital she told me she ws very light headed and had rapid heart rate; she was not drinking or eating much because she did not feel like it; was having no pain; still had modest slow stream with minimal burning and she did not feel totally empty; she was urinating about every two hours Her nausea is gone today and she feels much better Serum Cr has decreased to 1.26 Serum K is low  Hb decreased some due to rehydration and no clinically significant bleeding Plan: D/C home when OK with medical team; provide K; I will follow from home; send home with foley for a few more days; continue cipro as prescribed; take stool softeners PRN Kotaro Buer

## 2011-05-02 NOTE — Progress Notes (Signed)
Pt. Discharged with foley catheter in place. Pt. felt comfortable with how to empty foley bag. Anjalee Cope, Joslyn Devon

## 2011-05-02 NOTE — Discharge Summary (Signed)
Patient ID: BRENNYN ORTLIEB MRN: [id number] DOB/AGE: 69-21-1942 69 y.o.  Admit date: 04/30/2011 Discharge date: 05/02/2011  Primary Care Physician:  Syliva Overman, MD, MD   Discharge Diagnoses:    Present on Admission:  .Vaginal bleeding .Acute renal failure .Dehydration . hypotension  Current Discharge Medication List    START taking these medications   Details  insulin aspart (NOVOLOG) 100 UNIT/ML injection Inject 0-9 Units into the skin 3 (three) times daily with meals. Qty: 1 vial, Refills: 0    Insulin Syringes, Disposable, U-100 0.3 ML MISC Inject 1 Syringe into the skin as needed. Qty: 100 each, Refills: 0    potassium chloride (K-DUR) 10 MEQ tablet Take 2 tablets (20 mEq total) by mouth daily. Qty: 6 tablet, Refills: 0      CONTINUE these medications which have CHANGED   Details  insulin glargine (LANTUS) 100 UNIT/ML injection Inject 35 Units into the skin at bedtime. Qty: 10 mL, Refills: 0      CONTINUE these medications which have NOT CHANGED   Details  amLODipine (NORVASC) 10 MG tablet Take 10 mg by mouth every morning.     B-D ULTRAFINE III SHORT PEN 31G X 8 MM MISC ONCE DAILY USE WITH LANTUS Qty: 100 each, Refills: 2    ciprofloxacin (CIPRO) 250 MG tablet Take 1 tablet (250 mg total) by mouth 2 (two) times daily. Qty: 10 tablet, Refills: 0    HYDROcodone-acetaminophen (VICODIN) 5-500 MG per tablet Take 1-2 tablets by mouth every 6 (six) hours as needed for pain. Qty: 40 tablet, Refills: 0    lovastatin (MEVACOR) 40 MG tablet Take 40 mg by mouth at bedtime.     pantoprazole (PROTONIX) 20 MG tablet Take 20 mg by mouth daily as needed. HEART BURN     Travoprost, BAK Free, (TRAVATAN) 0.004 % SOLN ophthalmic solution Place 1 drop into both eyes at bedtime.       STOP taking these medications     benazepril (LOTENSIN) 40 MG tablet      cloNIDine (CATAPRES) 0.2 MG tablet      metFORMIN (GLUCOPHAGE) 1000 MG tablet      metoprolol (LOPRESSOR)  50 MG tablet      triamterene-hydrochlorothiazide (MAXZIDE-25) 37.5-25 MG per tablet          Consults:   Urology service   Significant Diagnostic Studies:  Ct Abdomen Pelvis Wo Contrast  05/01/2011  *RADIOLOGY REPORT*  Clinical Data: Bleeding, recent bladder prolapse surgery.  CT ABDOMEN AND PELVIS WITHOUT CONTRAST  Technique:  Multidetector CT imaging of the abdomen and pelvis was performed following the standard protocol without intravenous contrast.  Comparison: None.  Findings: Lung bases are clear.  Intra-abdominal organ evaluation is limited without intravenous contrast.  Within this limitation, low attenuation of the liver suggests fatty infiltration.  Status post cholecystectomy. Unremarkable spleen, pancreas, adrenal glands, kidneys.  No hydronephrosis or hydroureter.  No bowel obstruction.  No CT evidence for colitis.  There is a small amount of free fluid, perihepatic and within the pelvis. This is nonspecific.  Status post hysterectomy.  Foley catheter within a decompressed bladder.  Bladder wall thickening is nonspecific given incomplete distension.  Normal caliber vasculature with scattered atherosclerotic calcification.  Multilevel degenerative changes without acute osseous abnormality.  IMPRESSION: Circumferential bladder wall thickening, nonspecific given decompression with Foley catheter in place.  Small amount of free intraperitoneal fluid measures water attenuation.  Hepatic steatosis.  Original Report Authenticated By: Waneta Martins, M.D.   Dg Chest 2 View  04/22/2011  *RADIOLOGY REPORT*  Clinical Data: Preop respiratory exam for cystocele and rectocele repair.  Hypertension and diabetes.  CHEST - 2 VIEW  Comparison:  09/05/2008  Findings:  The heart size and mediastinal contours are within normal limits.  Both lungs are clear.  Tortuosity of the thoracic aorta is stable.  The visualized skeletal structures are unremarkable.  IMPRESSION: No active cardiopulmonary disease.   Original Report Authenticated By: Danae Orleans, M.D.   Dg Chest Port 1 View  05/01/2011  *RADIOLOGY REPORT*  Clinical Data: Tachycardia, recent surgery.  PORTABLE CHEST - 1 VIEW  Comparison: 04/22/2011 radiograph  Findings: Mild left lower lobe opacity is likely atelectasis. Lungs are otherwise clear. No pleural effusion or pneumothorax. The cardiomediastinal contours are within normal limits. The visualized bones and soft tissues are without significant appreciable abnormality.  IMPRESSION: Mild left lower lobe opacity is favored to represent atelectasis. Otherwise, no acute cardiopulmonary process.  Original Report Authenticated By: Waneta Martins, M.D.    Brief H and P: For complete details please refer to admission H and P, but in brief   70 years old [place] woman who had bladder tack done on December 13 by Dr. McDiarmid . He has per her since the procedure she she was having difficulty urinating and described It as dripping associated with burning. She denies any fever of chills. Yesterday while she was urinating she passed a large clot of blood and then continued oozing . She felt weak and dizzy and came to the ED. In the ED she was found to be hypotensive with blood pressure 80s over 40s. She was given more than 3 L of fluids and have blood pressure improved to 100s over 50s now. Her hemoglobin was checked and found to be stable at 11.6.   Hospital Course:  Vaginal bleeds Status post recent anterior and posterior repair by Dr. Sherron Monday, patient was evaluated by urology service and there was no concern for significant bleed . Her hemoglobin remained relatively stable. She had a Foley catheter placed and will be discharged with Foley catheter has been urology recommendation. Patient to follow with urology as scheduled. Active Problems:  Acute renal failure Most likely secondary to dehydration and prerenal azotemia, creatinine on admission was 2.5 baseline creatinine was 0.6.  Patient was hydrated with IV fluids, enalapril, diuretic and metformin will held, with improvement of serum creatinine to 1.17 on today's labs. CT abdomen showed no obstructive uropathy.  Hypotension Patient was significantly hypotensive on admission with blood pressure on the 80s over 40s she was resuscitated with IV fluid and an obviously all antihypertensives were held her blood pressure is currently elevated at 163 /76, heart rate running in the 60s  in sinus rhythm.Iwill  resume amlodipine on discharge however I would continue to hold the rest of antihypertensive. Patient advised to check blood pressure twice a day at home.  Diabetes Mellitus: Patient was continued on Lantus insulin, however metformin was held because of renal failure. She was also placed on  sensitive insulin scale. CBGs with running between 89 and 180. I will continue to hold metformin on discharge however I will increase Lantus dose to 35 units and also give her a prescription for insulin scale. Patient advised to check her CBGs 3-4 times a day and record or readings to take part Dr. appointment for further adjustment of medication.  Hypokalemia Potassium level of 3.2 today, I would be for a dose of KCl 40 mEq before discharge. I would give her  prescription 4 KCl 20 mEq daily for 3 days. Patient will need repeat potassium level with her PCP or when she sees Dr. Ginger Carne on December 19th whichever comes first  Subjective Patient seen and examined, denies any complaints.  Filed Vitals:   05/02/11 0549  BP: 163/76  Pulse: 58  Temp: 98 F (36.7 C)  Resp: 18    General: Alert, awake, oriented x3, in no acute distress. HEENT: No bruits, no goiter. Heart: Regular rate and rhythm, without murmurs, rubs, gallops. Lungs: Clear to auscultation bilaterally. Abdomen: Soft, nontender, nondistended, positive bowel sounds. Extremities: No clubbing cyanosis or edema with positive pedal pulses. Neuro: Grossly intact,  nonfocal.   Disposition and Follow-up:  Discharge to home   follow with Dr. Ginger Carne as scheduled   follow with Dr. Lodema Hong ASAP Please repeat potassium level , defer to PCP and urology service. This discharge summary will be routed  to urology service and PCP. Time spent on Discharge: 50 minutes    Signed: Markale Birdsell 05/02/2011, 8:52 AM

## 2011-05-03 ENCOUNTER — Ambulatory Visit (INDEPENDENT_AMBULATORY_CARE_PROVIDER_SITE_OTHER): Payer: Medicare Other | Admitting: Family Medicine

## 2011-05-03 ENCOUNTER — Encounter: Payer: Self-pay | Admitting: Family Medicine

## 2011-05-03 VITALS — BP 150/86 | HR 67 | Resp 16 | Ht 65.0 in | Wt 174.4 lb

## 2011-05-03 DIAGNOSIS — IMO0001 Reserved for inherently not codable concepts without codable children: Secondary | ICD-10-CM

## 2011-05-03 DIAGNOSIS — E119 Type 2 diabetes mellitus without complications: Secondary | ICD-10-CM

## 2011-05-03 DIAGNOSIS — E785 Hyperlipidemia, unspecified: Secondary | ICD-10-CM

## 2011-05-03 DIAGNOSIS — I10 Essential (primary) hypertension: Secondary | ICD-10-CM

## 2011-05-03 DIAGNOSIS — N179 Acute kidney failure, unspecified: Secondary | ICD-10-CM

## 2011-05-03 NOTE — Patient Instructions (Signed)
F/U as before.  Start clonidine 0.2mg  one at night, your blood pressure is high.  Stay off of metformin at this time.  Chem7 on 12/19 as a stat.   Chem7 non fasting in January 2 to 3 days before appt   All the best!

## 2011-05-04 LAB — COMPREHENSIVE METABOLIC PANEL
ALT: 25 U/L (ref 0–35)
AST: 28 U/L (ref 0–37)
Albumin: 3.7 g/dL (ref 3.5–5.2)
Alkaline Phosphatase: 55 U/L (ref 39–117)
BUN: 9 mg/dL (ref 6–23)
CO2: 33 mEq/L — ABNORMAL HIGH (ref 19–32)
Calcium: 10.4 mg/dL (ref 8.4–10.5)
Chloride: 99 mEq/L (ref 96–112)
Creat: 0.9 mg/dL (ref 0.50–1.10)
Glucose, Bld: 231 mg/dL — ABNORMAL HIGH (ref 70–99)
Potassium: 4.2 mEq/L (ref 3.5–5.3)
Sodium: 140 mEq/L (ref 135–145)
Total Bilirubin: 0.4 mg/dL (ref 0.3–1.2)
Total Protein: 7.6 g/dL (ref 6.0–8.3)

## 2011-05-04 NOTE — Assessment & Plan Note (Signed)
Controlled , pt to hod off metformin until labs are re visited

## 2011-05-04 NOTE — Progress Notes (Signed)
Subjective:     Patient ID: Joanna Brown, female   DOB: 1940-11-09, 70 y.o.   MRN: [id number]  HPI Pt in fo hospital f/u repair of cystocoele and rectocoele , which was complicated post operatively by dehydration, palpitations and hypokalemia.she is recovering slowly, but lfeels somewhat weak, has been off some of her medications and is here for re-assesment to re introduce them Denies fever, chills or hematuria, has appt with her surgeon in 2 days Denies polyuroia, polydipsia, blurred vision, sugars are running higher   Review of Systems See HPI Denies recent fever or chills. Denies sinus pressure, nasal congestion, ear pain or sore throat. Denies chest congestion, productive cough or wheezing. Denies chest pains, palpitations and leg swelling Denies abdominal pain, nausea, vomiting,diarrhea or constipation.   Denies dysuria, frequency, hesitancy or incontinence. Denies joint pain, swelling and limitation in mobility. Denies headaches, seizures, numbness, or tingling. Denies depression, anxiety or insomnia. Denies skin break down or rash.        Objective:   Physical Exam Patient alert and oriented and in no cardiopulmonary distress.  HEENT: No facial asymmetry, EOMI, no sinus tenderness,  oropharynx pink and moist.  Neck supple no adenopathy.  Chest: Clear to auscultation bilaterally.  CVS: S1, S2 no murmurs, no S3.  ABD: Soft non tender. Bowel sounds normal.  Ext: No edema  MS: Adequate ROM spine, shoulders, hips and knees.  Skin: Intact, no ulcerations or rash noted.  Psych: Good eye contact, normal affect. Memory intact not anxious or depressed appearing.  CNS: CN 2-12 intact, power, tone and sensation normal throughout.     Assessment:         Plan:

## 2011-05-04 NOTE — Assessment & Plan Note (Addendum)
Uncontrolled, will start clonidine

## 2011-05-04 NOTE — Assessment & Plan Note (Signed)
Improved and will recheck lab in 2 days

## 2011-05-04 NOTE — Assessment & Plan Note (Signed)
Low fat diet continue medication as before,

## 2011-05-16 LAB — COMPREHENSIVE METABOLIC PANEL WITH GFR
ALT: 22 U/L (ref 0–35)
AST: 26 U/L (ref 0–37)
Albumin: 3.9 g/dL (ref 3.5–5.2)
Alkaline Phosphatase: 47 U/L (ref 39–117)
BUN: 13 mg/dL (ref 6–23)
CO2: 28 meq/L (ref 19–32)
Calcium: 9.5 mg/dL (ref 8.4–10.5)
Chloride: 105 meq/L (ref 96–112)
Creat: 0.82 mg/dL (ref 0.50–1.10)
Glucose, Bld: 119 mg/dL — ABNORMAL HIGH (ref 70–99)
Potassium: 3.8 meq/L (ref 3.5–5.3)
Sodium: 141 meq/L (ref 135–145)
Total Bilirubin: 0.4 mg/dL (ref 0.3–1.2)
Total Protein: 7 g/dL (ref 6.0–8.3)

## 2011-05-18 ENCOUNTER — Ambulatory Visit: Payer: Medicare Other | Admitting: Family Medicine

## 2011-05-18 ENCOUNTER — Other Ambulatory Visit: Payer: Self-pay | Admitting: Family Medicine

## 2011-05-26 ENCOUNTER — Other Ambulatory Visit: Payer: Self-pay

## 2011-05-26 MED ORDER — INSULIN GLARGINE 100 UNIT/ML ~~LOC~~ SOLN: 35.0000 [IU] | Freq: Every day | SUBCUTANEOUS | Status: DC

## 2011-05-30 DIAGNOSIS — R339 Retention of urine, unspecified: Secondary | ICD-10-CM | POA: Diagnosis not present

## 2011-06-07 DIAGNOSIS — R5383 Other fatigue: Secondary | ICD-10-CM | POA: Diagnosis not present

## 2011-06-07 DIAGNOSIS — R5381 Other malaise: Secondary | ICD-10-CM | POA: Diagnosis not present

## 2011-06-07 DIAGNOSIS — E119 Type 2 diabetes mellitus without complications: Secondary | ICD-10-CM | POA: Diagnosis not present

## 2011-06-07 DIAGNOSIS — E785 Hyperlipidemia, unspecified: Secondary | ICD-10-CM | POA: Diagnosis not present

## 2011-06-08 ENCOUNTER — Encounter: Payer: Self-pay | Admitting: Family Medicine

## 2011-06-08 LAB — LIPID PANEL
Cholesterol: 140 mg/dL (ref 0–200)
HDL: 36 mg/dL — ABNORMAL LOW (ref >39)
LDL Cholesterol: 61 mg/dL (ref 0–99)
Total CHOL/HDL Ratio: 3.9 Ratio
Triglycerides: 217 mg/dL — ABNORMAL HIGH (ref <150)
VLDL: 43 mg/dL — ABNORMAL HIGH (ref 0–40)

## 2011-06-08 LAB — CBC WITH DIFFERENTIAL/PLATELET
Basophils Absolute: 0 10*3/uL (ref 0.0–0.1)
Basophils Relative: 1 % (ref 0–1)
Eosinophils Absolute: 0.2 10*3/uL (ref 0.0–0.7)
Eosinophils Relative: 4 % (ref 0–5)
HCT: 36.2 % (ref 36.0–46.0)
Hemoglobin: 11.8 g/dL — ABNORMAL LOW (ref 12.0–15.0)
Lymphocytes Relative: 40 % (ref 12–46)
Lymphs Abs: 2.3 10*3/uL (ref 0.7–4.0)
MCH: 28.4 pg (ref 26.0–34.0)
MCHC: 32.6 g/dL (ref 30.0–36.0)
MCV: 87 fL (ref 78.0–100.0)
Monocytes Absolute: 0.4 10*3/uL (ref 0.1–1.0)
Monocytes Relative: 6 % (ref 3–12)
Neutro Abs: 2.9 10*3/uL (ref 1.7–7.7)
Neutrophils Relative %: 50 % (ref 43–77)
Platelets: 195 10*3/uL (ref 150–400)
RBC: 4.16 MIL/uL (ref 3.87–5.11)
RDW: 14.6 % (ref 11.5–15.5)
WBC: 5.8 10*3/uL (ref 4.0–10.5)

## 2011-06-08 LAB — COMPLETE METABOLIC PANEL WITH GFR
ALT: 23 U/L (ref 0–35)
AST: 27 U/L (ref 0–37)
Albumin: 4.2 g/dL (ref 3.5–5.2)
Alkaline Phosphatase: 44 U/L (ref 39–117)
BUN: 9 mg/dL (ref 6–23)
CO2: 31 mEq/L (ref 19–32)
Calcium: 9.2 mg/dL (ref 8.4–10.5)
Chloride: 102 mEq/L (ref 96–112)
Creat: 0.69 mg/dL (ref 0.50–1.10)
GFR, Est African American: >89 mL/min (ref >60)
GFR, Est Non African American: 89 mL/min (ref >60)
Glucose, Bld: 106 mg/dL — ABNORMAL HIGH (ref 70–99)
Potassium: 3.5 mEq/L (ref 3.5–5.3)
Sodium: 140 mEq/L (ref 135–145)
Total Bilirubin: 0.3 mg/dL (ref 0.3–1.2)
Total Protein: 6.9 g/dL (ref 6.0–8.3)

## 2011-06-08 LAB — HEMOGLOBIN A1C
Hgb A1c MFr Bld: 7 % — ABNORMAL HIGH (ref <5.7)
Mean Plasma Glucose: 154 mg/dL — ABNORMAL HIGH (ref <117)

## 2011-06-08 LAB — TSH: TSH: 4.183 u[IU]/mL (ref 0.350–4.500)

## 2011-06-09 ENCOUNTER — Ambulatory Visit (INDEPENDENT_AMBULATORY_CARE_PROVIDER_SITE_OTHER): Payer: Medicare Other | Admitting: Family Medicine

## 2011-06-09 ENCOUNTER — Encounter: Payer: Self-pay | Admitting: Family Medicine

## 2011-06-09 ENCOUNTER — Other Ambulatory Visit (HOSPITAL_COMMUNITY)
Admission: RE | Admit: 2011-06-09 | Discharge: 2011-06-09 | Disposition: A | Discharge status: Home / Self Care | Payer: Medicare Other | Source: Ambulatory Visit | Attending: Family Medicine | Admitting: Family Medicine

## 2011-06-09 VITALS — BP 188/78 | HR 78 | Resp 18 | Ht 65.0 in | Wt 175.1 lb

## 2011-06-09 DIAGNOSIS — N390 Urinary tract infection, site not specified: Secondary | ICD-10-CM

## 2011-06-09 DIAGNOSIS — E119 Type 2 diabetes mellitus without complications: Secondary | ICD-10-CM | POA: Diagnosis not present

## 2011-06-09 DIAGNOSIS — R102 Pelvic and perineal pain unspecified side: Secondary | ICD-10-CM

## 2011-06-09 DIAGNOSIS — E785 Hyperlipidemia, unspecified: Secondary | ICD-10-CM

## 2011-06-09 DIAGNOSIS — Z124 Encounter for screening for malignant neoplasm of cervix: Secondary | ICD-10-CM | POA: Diagnosis not present

## 2011-06-09 DIAGNOSIS — Z0000 Encounter for general adult medical examination without abnormal findings: Secondary | ICD-10-CM | POA: Diagnosis not present

## 2011-06-09 DIAGNOSIS — I10 Essential (primary) hypertension: Secondary | ICD-10-CM

## 2011-06-09 DIAGNOSIS — N3000 Acute cystitis without hematuria: Secondary | ICD-10-CM | POA: Diagnosis not present

## 2011-06-09 DIAGNOSIS — N949 Unspecified condition associated with female genital organs and menstrual cycle: Secondary | ICD-10-CM

## 2011-06-09 LAB — POC HEMOCCULT BLD/STL (OFFICE/1-CARD/DIAGNOSTIC): Fecal Occult Blood, POC: NEGATIVE

## 2011-06-09 LAB — POCT URINALYSIS DIPSTICK
Bilirubin, UA: NEGATIVE
Glucose, UA: NEGATIVE
Ketones, UA: NEGATIVE
Nitrite, UA: NEGATIVE
Protein, UA: NEGATIVE
Spec Grav, UA: 1.01
Urobilinogen, UA: 0.2
pH, UA: 6.5

## 2011-06-09 MED ORDER — TRIAMTERENE-HCTZ 75-50 MG PO TABS: 1.0000 | ORAL_TABLET | Freq: Every day | ORAL | Status: DC

## 2011-06-09 NOTE — Patient Instructions (Signed)
F/U in 6 weeks.  Blood pressure is too high, dose increase on triamterene 25 mg to triamterene 50 mg daily. Take TWO of your triamterene 25mg  tablets once daily, till done, new script is for triamterene 50mg  ONE daily   Please cut back on butter and cheese , triglycerides are high   Urine is being checked today  You will get info on constipation, pls take stool softeners colace 2 twice daily, this is non prescription. Magnesium citrate is the "strongest" laxative , also OTC

## 2011-06-11 DIAGNOSIS — N3000 Acute cystitis without hematuria: Secondary | ICD-10-CM | POA: Insufficient documentation

## 2011-06-11 LAB — URINE CULTURE
Colony Count: NO GROWTH
Organism ID, Bacteria: NO GROWTH

## 2011-06-11 NOTE — Progress Notes (Signed)
  Subjective:    Patient ID: Joanna Brown, female    DOB: 09/24/1940, 71 y.o.   MRN: [id number]  HPI The PT is here annual exam  and re-evaluation of chronic medical conditions, medication management and review of any available recent lab and radiology data.  Preventive health is updated, specifically  Cancer screening and Immunization.   Questions or concerns regarding consultations or procedures which the PT has had in the interim are  addressed. The PT denies any adverse reactions to current medications since the last visit.   3 day h/o urinary frequency and discomfort with voiding     Review of Systems See HPI Denies recent fever or chills. Denies sinus pressure, nasal congestion, ear pain or sore throat. Denies chest congestion, productive cough or wheezing. Denies chest pains, palpitations and leg swelling Denies abdominal pain, nausea, vomiting,diarrhea or constipation.    Denies joint pain, swelling and limitation in mobility. Denies headaches, seizures, numbness, or tingling. Denies depression, anxiety or insomnia. Denies skin break down or rash.        Objective:   Physical Exam Pleasant well nourished female, alert and oriented x 3, in no cardio-pulmonary distress. Afebrile. HEENT No facial trauma or asymetry. Sinuses non tender.  EOMI, PERTL, fundoscopic exam is normal, no hemorhage or exudate.  External ears normal, tympanic membranes clear. Oropharynx moist, no exudate, good dentition. Neck: supple, no adenopathy,JVD or thyromegaly.No bruits.  Chest: Clear to ascultation bilaterally.No crackles or wheezes. Non tender to palpation  Breast: No asymetry,no masses. No nipple discharge or inversion. No axillary or supraclavicular adenopathy  Cardiovascular system; Heart sounds normal,  S1 and  S2 ,no S3.  No murmur, or thrill. Apical beat not displaced Peripheral pulses normal.  Abdomen: Soft, non tender, no organomegaly or masses. No bruits. Bowel  sounds normal. No guarding, tenderness or rebound.  Rectal:  No mass. Guaiac negative stool.  GU: External genitalia normal. No lesions. Vaginal canal normal.No discharge. Uterus absent , no adnexal masses, no adnexal tenderness.  Musculoskeletal exam: Full ROM of spine, hips , shoulders and knees. No deformity ,swelling or crepitus noted. No muscle wasting or atrophy.   Neurologic: Cranial nerves 2 to 12 intact. Power, tone ,sensation and reflexes normal throughout. No disturbance in gait. No tremor.  Skin: Intact, no ulceration, erythema , scaling or rash noted. Pigmentation normal throughout  Psych; Normal mood and affect. Judgement and concentration normal        Assessment & Plan:

## 2011-06-11 NOTE — Assessment & Plan Note (Signed)
Abnormal ccua and symptomatic , will await c/s report to treat

## 2011-06-11 NOTE — Assessment & Plan Note (Signed)
Uncontrolled, no med change, dietary modification only

## 2011-06-11 NOTE — Assessment & Plan Note (Signed)
Uncontrolled, dose increase on med

## 2011-06-11 NOTE — Assessment & Plan Note (Signed)
Controlled, no change in medication  

## 2011-07-18 DIAGNOSIS — Z1231 Encounter for screening mammogram for malignant neoplasm of breast: Secondary | ICD-10-CM | POA: Diagnosis not present

## 2011-07-20 DIAGNOSIS — N816 Rectocele: Secondary | ICD-10-CM | POA: Diagnosis not present

## 2011-07-20 DIAGNOSIS — N8111 Cystocele, midline: Secondary | ICD-10-CM | POA: Diagnosis not present

## 2011-07-20 DIAGNOSIS — N3946 Mixed incontinence: Secondary | ICD-10-CM | POA: Diagnosis not present

## 2011-07-28 DIAGNOSIS — I359 Nonrheumatic aortic valve disorder, unspecified: Secondary | ICD-10-CM | POA: Diagnosis not present

## 2011-07-28 DIAGNOSIS — I10 Essential (primary) hypertension: Secondary | ICD-10-CM | POA: Diagnosis not present

## 2011-07-28 DIAGNOSIS — E782 Mixed hyperlipidemia: Secondary | ICD-10-CM | POA: Diagnosis not present

## 2011-07-29 ENCOUNTER — Encounter: Payer: Self-pay | Admitting: Family Medicine

## 2011-08-08 ENCOUNTER — Ambulatory Visit: Payer: Medicare Other | Admitting: Family Medicine

## 2011-08-09 ENCOUNTER — Encounter: Payer: Self-pay | Admitting: Family Medicine

## 2011-08-09 ENCOUNTER — Ambulatory Visit (INDEPENDENT_AMBULATORY_CARE_PROVIDER_SITE_OTHER): Payer: Medicare Other | Admitting: Family Medicine

## 2011-08-09 VITALS — BP 120/78 | HR 84 | Resp 16 | Ht 65.0 in | Wt 176.0 lb

## 2011-08-09 DIAGNOSIS — F329 Major depressive disorder, single episode, unspecified: Secondary | ICD-10-CM | POA: Diagnosis not present

## 2011-08-09 DIAGNOSIS — E119 Type 2 diabetes mellitus without complications: Secondary | ICD-10-CM

## 2011-08-09 DIAGNOSIS — F3289 Other specified depressive episodes: Secondary | ICD-10-CM

## 2011-08-09 DIAGNOSIS — E785 Hyperlipidemia, unspecified: Secondary | ICD-10-CM | POA: Diagnosis not present

## 2011-08-09 DIAGNOSIS — F32A Depression, unspecified: Secondary | ICD-10-CM | POA: Insufficient documentation

## 2011-08-09 DIAGNOSIS — I10 Essential (primary) hypertension: Secondary | ICD-10-CM

## 2011-08-09 NOTE — Assessment & Plan Note (Signed)
Worse with the illness of her son, very concerned about his relationship, feels like crying every day, has a support system, but wants to go for therapy

## 2011-08-09 NOTE — Patient Instructions (Signed)
F/u end August.  Please call if you need me before  Blood pressure is good.  Triglycerides were high, please cut back opn red meat, cheese, butter and oils.  I believe that your headache is from tension and stress. The decision to see a therapist is a good one.  HBa1C end April.(non fasting)  Fasting lipid, cmp and eGfR , and HBA1C end August.  You need the shingles vaccine

## 2011-08-09 NOTE — Progress Notes (Signed)
  Subjective:    Patient ID: Joanna Brown, female    DOB: May 11, 1941, 71 y.o.   MRN: [id number]  HPI The PT is here for follow up and re-evaluation of chronic medical conditions, medication management and review of any available recent lab and radiology data.  Preventive health is updated, specifically  Cancer screening and Immunization.   Questions or concerns regarding consultations or procedures which the PT has had in the interim are  addressed. The PT denies any adverse reactions to current medications since the last visit.  C/o increased headache frequency in the past 3 weeks, under increased stress due to ill health of her only child, and bad relationships with the mother of her grandchild. Ready to seek counseling, not suicidal or homicidal. Denies polyuria, polydipsia or blurred vision     Review of Systems See HPI Denies recent fever or chills. Denies sinus pressure, nasal congestion, ear pain or sore throat. Denies chest congestion, productive cough or wheezing. Denies chest pains, palpitations and leg swelling Denies abdominal pain, nausea, vomiting,diarrhea or constipation.   Denies dysuria, frequency, hesitancy or incontinence. Denies joint pain, swelling and limitation in mobility. Denies  seizures, numbness, or tingling. Denies skin break down or rash.        Objective:   Physical Exam Patient alert and oriented and in no cardiopulmonary distress.  HEENT: No facial asymmetry, EOMI, no sinus tenderness,  oropharynx pink and moist.  Neck supple no adenopathy.  Chest: Clear to auscultation bilaterally.  CVS: S1, S2 no murmurs, no S3.  ABD: Soft non tender. Bowel sounds normal.  Ext: No edema  MS: Adequate ROM spine, shoulders, hips and knees.  Skin: Intact, no ulcerations or rash noted.  Psych: Good eye contact, flat  affect. Memory intact  anxious and  depressed appearing.Tearful at times  CNS: CN 2-12 intact, power, tone and sensation normal  throughout.        Assessment & Plan:

## 2011-08-09 NOTE — Assessment & Plan Note (Addendum)
Reports fasting sugars between 110 to 140, advised more control over diet, will follow next hBa1C

## 2011-08-09 NOTE — Assessment & Plan Note (Signed)
Controlled, no change in medication  

## 2011-08-10 ENCOUNTER — Ambulatory Visit: Payer: Medicare Other | Admitting: Family Medicine

## 2011-08-10 ENCOUNTER — Other Ambulatory Visit: Payer: Self-pay

## 2011-08-10 MED ORDER — CLONIDINE HCL 0.2 MG PO TABS: 0.2000 mg | ORAL_TABLET | Freq: Two times a day (BID) | ORAL | Status: DC

## 2011-08-10 MED ORDER — METFORMIN HCL 1000 MG PO TABS: 1000.0000 mg | ORAL_TABLET | Freq: Two times a day (BID) | ORAL | Status: DC

## 2011-08-10 MED ORDER — AMLODIPINE BESYLATE 10 MG PO TABS: 10.0000 mg | ORAL_TABLET | ORAL | Status: DC

## 2011-08-10 MED ORDER — BENAZEPRIL HCL 40 MG PO TABS: 40.0000 mg | ORAL_TABLET | Freq: Every day | ORAL | Status: DC

## 2011-08-10 MED ORDER — LOVASTATIN 40 MG PO TABS: 40.0000 mg | ORAL_TABLET | Freq: Every day | ORAL | Status: DC

## 2011-08-14 NOTE — Assessment & Plan Note (Signed)
Deteriorated, TG's elevated, low fat diet discussed and encouraged

## 2011-08-15 HISTORY — PX: TRANSTHORACIC ECHOCARDIOGRAM: SHX275

## 2011-08-30 ENCOUNTER — Other Ambulatory Visit: Payer: Self-pay

## 2011-08-30 ENCOUNTER — Telehealth: Payer: Self-pay | Admitting: Family Medicine

## 2011-08-30 MED ORDER — INSULIN GLARGINE 100 UNIT/ML ~~LOC~~ SOLN: 35.0000 [IU] | Freq: Every day | SUBCUTANEOUS | Status: DC

## 2011-08-30 NOTE — Telephone Encounter (Signed)
Med reordered.

## 2011-08-31 DIAGNOSIS — R3915 Urgency of urination: Secondary | ICD-10-CM | POA: Diagnosis not present

## 2011-09-01 ENCOUNTER — Other Ambulatory Visit: Payer: Self-pay

## 2011-09-01 ENCOUNTER — Telehealth: Payer: Self-pay | Admitting: Family Medicine

## 2011-09-01 MED ORDER — INSULIN GLARGINE 100 UNIT/ML ~~LOC~~ SOLN: 35.0000 [IU] | Freq: Every day | SUBCUTANEOUS | Status: DC

## 2011-09-01 NOTE — Telephone Encounter (Signed)
Med resent 

## 2011-09-05 DIAGNOSIS — I359 Nonrheumatic aortic valve disorder, unspecified: Secondary | ICD-10-CM | POA: Diagnosis not present

## 2011-09-08 ENCOUNTER — Ambulatory Visit (INDEPENDENT_AMBULATORY_CARE_PROVIDER_SITE_OTHER): Payer: Medicare Other | Admitting: Psychiatry

## 2011-09-08 ENCOUNTER — Encounter (HOSPITAL_COMMUNITY): Payer: Self-pay | Admitting: Psychiatry

## 2011-09-08 DIAGNOSIS — F3289 Other specified depressive episodes: Secondary | ICD-10-CM

## 2011-09-08 DIAGNOSIS — F32A Depression, unspecified: Secondary | ICD-10-CM

## 2011-09-08 DIAGNOSIS — F329 Major depressive disorder, single episode, unspecified: Secondary | ICD-10-CM | POA: Diagnosis not present

## 2011-09-13 ENCOUNTER — Encounter: Payer: Self-pay | Admitting: Family Medicine

## 2011-09-13 NOTE — Patient Instructions (Signed)
Discussed orally 

## 2011-09-13 NOTE — Progress Notes (Signed)
Patient:   Joanna Brown   DOB:   19-Jun-1940  MR Number:  [id number]  Location:  [address] [place], [place] [address]  Date of Service:   09/08/2011  Start Time:   10:00 AM End Time:   10:55 AM  Provider/Observer:  Florencia Reasons, MSW, LCSW   Billing Code/Service:  [phone]  Chief Complaint:     Chief Complaint  Patient presents with  . Depression    Reason for Service:  The patient is referred for services by primary care physician Dr. Lodema Hong due to patient experiencing symptoms of depression. Patient reports stress related to her concerns about her 45 year old son and her 10-year-old granddaughter. Her son has been in the hospital 2 times within the past 3 years due to problems with his heart. A pacemaker was placed in his heart about a month ago. She also worries about son's involvement with his girlfriend as patient disapproves of her as she has been an in and out of his life and their 56-year-old daughter's life for the past 3 years. She reports that the girlfriend does not take good care of patient's granddaughter and does not take care of things around her son's home. Patient expresses frustration that her son remains involved with his girlfriend. Patient reports sadness, anger, and resentment.  Current Status:  The patient is experiencing depressed mood, racing thoughts, excessive worrying, low energy, poor concentration, and loss of interest in activities  Reliability of Information: Reliable  Behavioral Observation: OMA MARZAN  presents as a 71 y.o.-year-old  African American Female who appeared younger than her stated age. Her dress was appropriate and she was casual in appearance. Her manners were appropriate to the situation.  There were not any physical disabilities noted.  She displayed an appropriate level of cooperation and motivation.    Interactions:    Active   Attention:   within normal limits  Memory:   within normal limits  Visuo-spatial:   within normal  limits  Speech (Volume):  normal  Speech:   soft  Thought Process:  Coherent and Relevant  Though Content:  WNL  Orientation:   person, place, time/date, situation, day of week, month of year and year  Judgment:   Good  Planning:   Good  Affect:    Angry and Depressed  Mood:    Anxious and Depressed  Insight:   Good  Intelligence:   normal  Marital Status/Living: The patient was born and reared in [place]. She is fourth of 7 siblings. She she describes her household during her childhood as loud and noisy. Her father was a former and her mother was a housewife. Patient reports moving to [place] where she resided for 50 years before returning to [place] for years ago. She and her husband have been married for 36 years. Patient has a 49 year old son from a previous relationship. The patient and her husband reside in [place]. Her son resides in [place].  Current Employment: Retired  Past Employment:  Patient worked for the [employer] for 35 years before retiring in 2000.  Substance Use:  No concerns of substance abuse are reported.   Education:   HS Graduate  Medical History:   Past Medical History  Diagnosis Date  . Diabetes mellitus   . GERD (gastroesophageal reflux disease)   . Hyperlipidemia   . Dysrhythmia   . Blood transfusion     1980  . Hypertension     eccho and stress 4/10 reports  on chart, EKG ` LOV 9/12 on chart  . Female bladder prolapse     Sexual History:   History  Sexual Activity  . Sexually Active: Not on file    Abuse/Trauma History: Denies  Psychiatric History:  The patient denies any psychiatric hospitalizations and reports no involvement in outpatient psychotherapy.  Family Med/Psych History:  Family History  Problem Relation Age of Onset  . Heart disease Mother     MI  . Cancer Mother   . Heart disease Father     MI  . Cancer Sister     liver  . Cancer Sister     bone   Patient denies any family  psychiatric history.  Risk of Suicide/Violence: virtually non-existent. Patient denies any past and current suicidal and homicidal ideations. She reports no history of aggression or violence.  Impression/DX:  The patient reports experiencing stress related to her concerns about her son and granddaughter. Per patient's report, symptoms began about 3 years ago when her son first became ill do to problems with his heart. He has been hospitalized twice within the past 2 years and most recently received a pacemaker. Patient reports excessive worry about her son as well as her granddaughter whom patient fears is not being well taken care of by her mother. Patient disapproves of her son's relationship. Patient's current symptoms include sadness, tearfulness, excessive worry, poor concentration, low energy, and loss of interest in activities. Diagnosis: depressive disorder NOS  Disposition/Plan:  The patient attends the assessment appointment today. Confidentiality and limits are discussed. Patient agrees to return for an appointment in 2 weeks for continuing assessment and treatment planning. The patient agrees to call this practice, call 911, or have someone take her to the emergency room should symptoms worsen.  Diagnosis:    Axis I:   1. Depressive disorder         Axis II: Deferred       Axis III:  See medical history      Axis IV:  problems with primary support group          Axis V:  51-60 moderate symptoms

## 2011-09-22 ENCOUNTER — Ambulatory Visit (INDEPENDENT_AMBULATORY_CARE_PROVIDER_SITE_OTHER): Payer: Medicare Other | Admitting: Psychiatry

## 2011-09-22 DIAGNOSIS — F3289 Other specified depressive episodes: Secondary | ICD-10-CM

## 2011-09-22 DIAGNOSIS — F329 Major depressive disorder, single episode, unspecified: Secondary | ICD-10-CM | POA: Diagnosis not present

## 2011-09-22 DIAGNOSIS — F32A Depression, unspecified: Secondary | ICD-10-CM

## 2011-09-23 DIAGNOSIS — E119 Type 2 diabetes mellitus without complications: Secondary | ICD-10-CM | POA: Diagnosis not present

## 2011-09-24 LAB — HEMOGLOBIN A1C
Hgb A1c MFr Bld: 6.6 % — ABNORMAL HIGH
Mean Plasma Glucose: 143 mg/dL — ABNORMAL HIGH

## 2011-09-26 NOTE — Patient Instructions (Signed)
Discussed orally 

## 2011-09-26 NOTE — Progress Notes (Signed)
Patient:  Joanna Brown   DOB: July 11, 1967  MR Number: [id number]  [facility]  [address] [place],  [place], [address]  Start: Thursday 09/22/2011 9:00 AM End: Thursday 09/22/2011 9:50 AM  Provider/Observer:     Florencia Reasons, MSW, LCSW   Chief Complaint:      Chief Complaint  Patient presents with  . Depression    Reason For Service:      The patient was referred for services by primary care physician Dr. Lodema Hong due to patient experiencing symptoms of depression. Patient reported stress related to her concerns about her 70 year old son and her 70-year-old granddaughter. Her son has been in the hospital 2 times within the past 3 years due to problems with his heart. A pacemaker was placed in his heart about a month ago. She also worried about son's involvement with his girlfriend as patient disapproves of her as she has been  in and out of his life and their 70-year-old daughter's life for the past 3 years. She reports that the girlfriend does not take good care of patient's granddaughter and does not take care of things around her son's home. Patient expressed frustration that her son remains involved with his girlfriend. Patient reported sadness, anger, and resentment. Patient is seen today for a followup appointment.  Interventions Strategy:  Supportive therapy  Participation Level:   Active  Participation Quality:  Appropriate      Behavioral Observation:  Well Groomed, Alert, and Appropriate.   Current Psychosocial Factors: The patient reports no new stressors.  Content of Session:   Reviewing symptoms, processing feelings, identifying support system and ways to use support system, termination  Current Status:   The patient reports improved mood, increased energy, decreased anxiety, absence of worry, and absence of crying spells since last session.  Patient Progress:   Good. The patient reports feeling much better since having an opportunity to talk during  her first session. She also reports she has begun to share more information about her concerns with her husband and reports this has been going well. She reports decreased worry about her son as she realizes he is responsible for his own decisions. Patient reports decreased worry about her granddaughter as well. Patient states feeling happier and reports resuming normal interest in activities. She has started a back in the regimen. She has been socializing and going out to lunch with family. She also plans to join a senior citizens group and participate in other activities. The patient is pleased with her progress and has decided to discontinue treatment. Therapist encourages patient to contact this practice should she need services in the future.  Target Goals:   Improve mood, decrease anxiety, termination  Last Reviewed:     Goals Addressed Today:    Improve mood, decrease anxiety, termination  Impression/Diagnosis:   The patient reported experiencing stress related to her concerns about her son and granddaughter. Per patient's report, symptoms began about 3 years ago when her son first became ill do to problems with his heart. He has been hospitalized twice within the past 2 years and most recently received a pacemaker. Patient reported excessive worry about her son as well as her granddaughter whom patient fears is not being well taken care of by her mother. Patient disapproves of her son's relationship. Patient's symptoms included sadness, tearfulness, excessive worry, poor concentration, low energy, and loss of interest in activities. Diagnosis: depressive disorder NOS   Diagnosis:  Axis I: Depressive Disorder NOS  Axis II: No diagnosis

## 2011-10-19 ENCOUNTER — Other Ambulatory Visit: Payer: Self-pay | Admitting: Family Medicine

## 2011-11-02 DIAGNOSIS — N8111 Cystocele, midline: Secondary | ICD-10-CM | POA: Diagnosis not present

## 2011-11-02 DIAGNOSIS — N3946 Mixed incontinence: Secondary | ICD-10-CM | POA: Diagnosis not present

## 2012-01-03 DIAGNOSIS — E782 Mixed hyperlipidemia: Secondary | ICD-10-CM | POA: Diagnosis not present

## 2012-01-10 ENCOUNTER — Encounter (HOSPITAL_COMMUNITY): Payer: Self-pay | Admitting: Emergency Medicine

## 2012-01-10 ENCOUNTER — Ambulatory Visit: Payer: Medicare Other | Admitting: Family Medicine

## 2012-01-10 ENCOUNTER — Emergency Department (HOSPITAL_COMMUNITY)
Admission: EM | Admit: 2012-01-10 | Discharge: 2012-01-10 | Disposition: A | Discharge status: Home / Self Care | Payer: Medicare Other | Attending: Emergency Medicine | Admitting: Emergency Medicine

## 2012-01-10 DIAGNOSIS — E119 Type 2 diabetes mellitus without complications: Secondary | ICD-10-CM | POA: Diagnosis not present

## 2012-01-10 DIAGNOSIS — Z809 Family history of malignant neoplasm, unspecified: Secondary | ICD-10-CM | POA: Diagnosis not present

## 2012-01-10 DIAGNOSIS — L259 Unspecified contact dermatitis, unspecified cause: Secondary | ICD-10-CM | POA: Diagnosis not present

## 2012-01-10 DIAGNOSIS — Z808 Family history of malignant neoplasm of other organs or systems: Secondary | ICD-10-CM | POA: Diagnosis not present

## 2012-01-10 DIAGNOSIS — Z800 Family history of malignant neoplasm of digestive organs: Secondary | ICD-10-CM | POA: Diagnosis not present

## 2012-01-10 DIAGNOSIS — K219 Gastro-esophageal reflux disease without esophagitis: Secondary | ICD-10-CM | POA: Diagnosis not present

## 2012-01-10 DIAGNOSIS — I10 Essential (primary) hypertension: Secondary | ICD-10-CM | POA: Insufficient documentation

## 2012-01-10 DIAGNOSIS — Z8249 Family history of ischemic heart disease and other diseases of the circulatory system: Secondary | ICD-10-CM | POA: Diagnosis not present

## 2012-01-10 DIAGNOSIS — L309 Dermatitis, unspecified: Secondary | ICD-10-CM

## 2012-01-10 MED ORDER — TRIAMCINOLONE ACETONIDE 0.1 % EX CREA: TOPICAL_CREAM | Freq: Two times a day (BID) | CUTANEOUS | Status: DC

## 2012-01-10 NOTE — ED Notes (Signed)
Patient with c/o rash to head and left shoulder. Patient reports rash is intermittent. +itching.

## 2012-01-10 NOTE — ED Provider Notes (Signed)
History     CSN: [id number]  Arrival date & time 01/10/12  1741   First MD Initiated Contact with Patient 01/10/12 1803      Chief Complaint  Patient presents with  . Rash    (Consider location/radiation/quality/duration/timing/severity/associated sxs/prior treatment) HPI Comments: Joanna Brown presents with a several day history of pruritic rash to her neck and posterior scalp.  Her symptoms started several days ago on her left shoulder which has since cleared,  But now is localized around the base of her anterior neck and reports itching along her posterior scalp line.  She does not recognize any new hair or skin products, new medicines, clothing, jewelry or other exposures that may be contributing to the rash.  She has tried an anti itch cream which helps with the itching.  She denies fevers, chills and there has been no drainage from the rash.  The history is provided by the patient.    Past Medical History  Diagnosis Date  . Diabetes mellitus   . GERD (gastroesophageal reflux disease)   . Hyperlipidemia   . Dysrhythmia   . Blood transfusion     1980  . Hypertension     eccho and stress 4/10 reports on chart, EKG ` LOV 9/12 on chart  . Female bladder prolapse     Past Surgical History  Procedure Date  . Abdominal hysterectomy   . Ovarian tumors removed bilateral   . Thyroidectomy   . Lymph nodes removed under right arm     right  . Cholecystectomy   . Thyroidectomy   . Colonoscopy w/ polypectomy   . Eye surgery     bilateral cataract extraction with IOL  . Anterior and posterior repair 04/26/2011    Procedure: ANTERIOR (CYSTOCELE) AND POSTERIOR REPAIR (RECTOCELE);  Surgeon: Martina Sinner, MD;  Location: [facility];  Service: Urology;  Laterality: N/A;  . Vaginal prolapse repair 04/26/2011    Procedure: VAGINAL VAULT SUSPENSION;  Surgeon: Martina Sinner, MD;  Location: [facility];  Service: Urology;  Laterality: N/A;  with Graft  10x6    Family History    Problem Relation Age of Onset  . Heart disease Mother     MI  . Cancer Mother   . Heart disease Father     MI  . Cancer Sister     liver  . Cancer Sister     bone    History  Substance Use Topics  . Smoking status: Never Smoker   . Smokeless tobacco: Never Used  . Alcohol Use: Yes     socially- none x 30 years    OB History    Grav Para Term Preterm Abortions TAB SAB Ect Mult Living                  Review of Systems  Constitutional: Negative for fever and chills.  HENT: Negative for facial swelling.   Respiratory: Negative for shortness of breath and wheezing.   Skin: Positive for rash.  Neurological: Negative for numbness.    Allergies  Review of patient's allergies indicates no known allergies.  Home Medications   Current Outpatient Rx  Name Route Sig Dispense Refill  . AMLODIPINE BESYLATE 10 MG PO TABS Oral Take 1 tablet (10 mg total) by mouth every morning. 30 tablet 5  . BD PEN NEEDLE SHORT U/F 31G X 8 MM MISC  ONCE DAILY USE WITH LANTUS 100 each 2  . BENAZEPRIL HCL 40 MG PO TABS Oral Take  1 tablet (40 mg total) by mouth daily. 30 tablet 5  . BENAZEPRIL HCL 40 MG PO TABS  TAKE 1 TABLET (40 MG TOTAL) BY MOUTH DAILY. 90 tablet 0  . CLONIDINE HCL 0.2 MG PO TABS Oral Take 1 tablet (0.2 mg total) by mouth 2 (two) times daily. 60 tablet 5  . FERROUS SULFATE 325 (65 FE) MG PO TABS Oral Take 325 mg by mouth daily with breakfast.    . INSULIN GLARGINE 100 UNIT/ML [place] SOLN Subcutaneous Inject 35 Units into the skin at bedtime. 3 mL 3  . INSULIN SYRINGES (DISPOSABLE) U-100 0.3 ML MISC Subcutaneous Inject 1 Syringe into the skin as needed. 100 each 0  . LOVASTATIN 40 MG PO TABS Oral Take 1 tablet (40 mg total) by mouth at bedtime. 30 tablet 5  . METFORMIN HCL 1000 MG PO TABS Oral Take 1 tablet (1,000 mg total) by mouth 2 (two) times daily with a meal. 60 tablet 5  . CENTRUM PO TABS Oral Take 1 tablet by mouth daily.    . TRAVOPROST (BAK FREE) 0.004 % OP SOLN Both Eyes  Place 1 drop into both eyes at bedtime.     . TRIAMCINOLONE ACETONIDE 0.1 % EX CREA Topical Apply topically 2 (two) times daily. Apply thin layer to affected area for maximum of 2 weeks. 30 g 0  . TRIAMTERENE-HCTZ 75-50 MG PO TABS Oral Take 1 tablet by mouth daily. 30 tablet 11    Dose increase effective 06/09/2011    BP 179/89  Pulse 87  Temp 98.3 F (36.8 C) (Oral)  Resp 16  Ht 5\' 5"  (1.651 m)  Wt 170 lb (77.111 kg)  BMI 28.29 kg/m2  SpO2 100%  Physical Exam  Constitutional: She appears well-developed and well-nourished. No distress.  HENT:  Head: Normocephalic.  Neck: Neck supple.  Cardiovascular: Normal rate.   Pulmonary/Chest: Effort normal. She has no wheezes.  Musculoskeletal: Normal range of motion. She exhibits no edema.  Skin: Rash noted. Rash is macular. No erythema.       Slightly raised, sandpaper quality rash along bilateral base of neck, and just in posterior hairline.  No drainage,  No erythema, induration or fluctuance.    ED Course  Procedures (including critical care time)  Labs Reviewed - No data to display No results found.   1. Dermatitis       MDM  Suspect either atopic or contact dermatitis.  Pt was placed on mid potency steroid cream,  May continue anti-itch cream or oral benadryl.  Recheck by pcp if not improving.  Cautioned to not use the steroid more than 2 weeks without recheck if rash persists.        Burgess Amor, PA 01/11/12 1210

## 2012-01-10 NOTE — Discharge Instructions (Signed)
Rash A rash is a change in the color or texture of your skin. There are many different types of rashes. You may have other problems that accompany your rash. CAUSES   Infections.   Allergic reactions. This can include allergies to pets or foods.   Certain medicines.   Exposure to certain chemicals, soaps, or cosmetics.   Heat.   Exposure to poisonous plants.   Tumors, both cancerous and noncancerous.  SYMPTOMS   Redness.   Scaly skin.   Itchy skin.   Dry or cracked skin.   Bumps.   Blisters.   Pain.  DIAGNOSIS  Your caregiver may do a physical exam to determine what type of rash you have. A skin sample (biopsy) may be taken and examined under a microscope. TREATMENT  Treatment depends on the type of rash you have. Your caregiver may prescribe certain medicines. For serious conditions, you may need to see a skin doctor (dermatologist). HOME CARE INSTRUCTIONS   Avoid the substance that caused your rash.   Do not scratch your rash. This can cause infection.   You may take cool baths to help stop itching.   Only take over-the-counter or prescription medicines as directed by your caregiver.   Keep all follow-up appointments as directed by your caregiver.  SEEK IMMEDIATE MEDICAL CARE IF:  You have increasing pain, swelling, or redness.   You have a fever.   You have new or severe symptoms.   You have body aches, diarrhea, or vomiting.   Your rash is not better after 3 days.  MAKE SURE YOU:  Understand these instructions.   Will watch your condition.   Will get help right away if you are not doing well or get worse.  Document Released: 04/22/2002 Document Revised: 04/21/2011 Document Reviewed: 02/14/2011 Exit[employer] Patient Information 2012 [employer]. 

## 2012-01-13 NOTE — ED Provider Notes (Signed)
Medical screening examination/treatment/procedure(s) were performed by non-physician practitioner and as supervising physician I was immediately available for consultation/collaboration.   Hope Holst L Alphonso Gregson, MD 01/13/12 1816 

## 2012-01-17 ENCOUNTER — Other Ambulatory Visit: Payer: Self-pay | Admitting: Family Medicine

## 2012-01-18 DIAGNOSIS — H409 Unspecified glaucoma: Secondary | ICD-10-CM | POA: Diagnosis not present

## 2012-01-18 DIAGNOSIS — H4011X0 Primary open-angle glaucoma, stage unspecified: Secondary | ICD-10-CM | POA: Diagnosis not present

## 2012-01-18 DIAGNOSIS — E109 Type 1 diabetes mellitus without complications: Secondary | ICD-10-CM | POA: Diagnosis not present

## 2012-01-18 DIAGNOSIS — Z961 Presence of intraocular lens: Secondary | ICD-10-CM | POA: Diagnosis not present

## 2012-01-30 ENCOUNTER — Ambulatory Visit (INDEPENDENT_AMBULATORY_CARE_PROVIDER_SITE_OTHER): Payer: Medicare Other | Admitting: Family Medicine

## 2012-01-30 ENCOUNTER — Encounter: Payer: Self-pay | Admitting: Family Medicine

## 2012-01-30 VITALS — BP 128/80 | HR 70 | Resp 18 | Ht 65.0 in | Wt 176.1 lb

## 2012-01-30 DIAGNOSIS — E669 Obesity, unspecified: Secondary | ICD-10-CM | POA: Diagnosis not present

## 2012-01-30 DIAGNOSIS — E119 Type 2 diabetes mellitus without complications: Secondary | ICD-10-CM

## 2012-01-30 DIAGNOSIS — E785 Hyperlipidemia, unspecified: Secondary | ICD-10-CM

## 2012-01-30 DIAGNOSIS — I10 Essential (primary) hypertension: Secondary | ICD-10-CM | POA: Diagnosis not present

## 2012-01-30 NOTE — Patient Instructions (Addendum)
F/u with rectal exam in 4.5 month  Fasting lipid, cmp and EGFR, HBA1C microalb Sept 24 or after. PLEASE do next week.  Fasting lipid, cmp and HBA1C end January BEFORE visiit.  Pls review info on flu vaccine, you need one.  Ask for shingles vaccine at your pharmacy, it is recommended.Script provided  It is important that you exercise regularly at least 30 minutes 5 times a week. If you develop chest pain, have severe difficulty breathing, or feel very tired, stop exercising immediately and seek medical attention  A healthy diet is rich in fruit, vegetables and whole grains. Poultry fish, nuts and beans are a healthy choice for protein rather then red meat. A low sodium diet and drinking 64 ounces of water daily is generally recommended. Oils and sweet should be limited. Carbohydrates especially for those who are diabetic or overweight, should be limited to 3-45 gram per meal. It is important to eat on a regular schedule, at least 3 times daily. Snacks should be primarily fruits, vegetables or nuts.   I am glad that you feel better

## 2012-01-30 NOTE — Progress Notes (Signed)
  Subjective:    Patient ID: Joanna Brown, female    DOB: [birth year], 71 y.o.   MRN: [id number]  HPI  The PT is here for follow up and re-evaluation of chronic medical conditions, medication management and review of any available recent lab and radiology data.  Preventive health is updated, specifically  Cancer screening and Immunization.   Questions or concerns regarding consultations or procedures which the PT has had in the interim are  Addressed.Has been to psychologist twice since last visit and is discharged. Eye exam 2 weeks ago, states her glaucoma is worsening , only on drops at this time The PT denies any adverse reactions to current medications since the last visit.  There are no new concerns.  There are no specific complaints      Review of Systems    See HPI Denies recent fever or chills. Denies sinus pressure, nasal congestion, ear pain or sore throat. Denies chest congestion, productive cough or wheezing. Denies chest pains, palpitations and leg swelling Denies abdominal pain, nausea, vomiting,diarrhea or constipation.   Denies dysuria, frequency, hesitancy or incontinence. Denies joint pain, swelling and limitation in mobility. Denies headaches, seizures, numbness, or tingling. Denies depression, anxiety or insomnia. Denies skin break down or rash.     Objective:   Physical Exam  Patient alert and oriented and in no cardiopulmonary distress.  HEENT: No facial asymmetry, EOMI, no sinus tenderness,  oropharynx pink and moist.  Neck supple no adenopathy.  Chest: Clear to auscultation bilaterally.  CVS: S1, S2 no murmurs, no S3.  ABD: Soft non tender. Bowel sounds normal.  Ext: No edema  MS: Adequate ROM spine, shoulders, hips and knees.  Skin: Intact, no ulcerations or rash noted.  Psych: Good eye contact, normal affect. Memory intact not anxious or depressed appearing.  CNS: CN 2-12 intact, power, tone and sensation normal throughout. Diabetic  Foot Check:  Appearance - no lesions, ulcers or calluses Skin - no unusual pallor or redness Sensation - grossly intact to light touch Monofilament testing -  Right - Great toe, medial, central, lateral ball and posterior foot intact Left - Great toe, medial, central, lateral ball and posterior foot intact Pulses Left - Dorsalis Pedis and Posterior Tibia normal Right - Dorsalis Pedis and Posterior Tibia normal       Assessment & Plan:

## 2012-02-03 MED ORDER — BENAZEPRIL HCL 40 MG PO TABS: 40.0000 mg | ORAL_TABLET | Freq: Every day | ORAL | Status: DC

## 2012-02-03 MED ORDER — AMLODIPINE BESYLATE 10 MG PO TABS: 10.0000 mg | ORAL_TABLET | ORAL | Status: DC

## 2012-02-03 MED ORDER — LOVASTATIN 40 MG PO TABS: 40.0000 mg | ORAL_TABLET | Freq: Every day | ORAL | Status: DC

## 2012-02-05 NOTE — Assessment & Plan Note (Signed)
Updated lab needed, well controlled when last checked, and appears to be the same by pt history

## 2012-02-05 NOTE — Assessment & Plan Note (Signed)
unchanged Patient re-educated about  the importance of commitment to a  minimum of 150 minutes of exercise per week. The importance of healthy food choices with portion control discussed. Encouraged to start a food diary, count calories and to consider  joining a support group. Sample diet sheets offered. Goals set by the patient for the next several months.    

## 2012-02-05 NOTE — Assessment & Plan Note (Signed)
Hyperlipidemia:Low fat diet discussed and encouraged.  Uncontrolled when last checked, updated labs to be obtained

## 2012-02-05 NOTE — Assessment & Plan Note (Signed)
Controlled, no change in medication DASH diet and commitment to daily physical activity for a minimum of 30 minutes discussed and encouraged, as a part of hypertension management. The importance of attaining a healthy weight is also discussed. DASH diet and commitment to daily physical activity for a minimum of 30 minutes discussed and encouraged, as a part of hypertension management. The importance of attaining a healthy weight is also discussed.

## 2012-02-09 DIAGNOSIS — E119 Type 2 diabetes mellitus without complications: Secondary | ICD-10-CM | POA: Diagnosis not present

## 2012-02-09 DIAGNOSIS — E785 Hyperlipidemia, unspecified: Secondary | ICD-10-CM | POA: Diagnosis not present

## 2012-02-09 LAB — COMPLETE METABOLIC PANEL WITH GFR
ALT: 32 U/L (ref 0–35)
AST: 35 U/L (ref 0–37)
Albumin: 4.4 g/dL (ref 3.5–5.2)
Alkaline Phosphatase: 39 U/L (ref 39–117)
BUN: 19 mg/dL (ref 6–23)
CO2: 28 mEq/L (ref 19–32)
Calcium: 10.2 mg/dL (ref 8.4–10.5)
Chloride: 101 mEq/L (ref 96–112)
Creat: 1.09 mg/dL (ref 0.50–1.10)
GFR, Est African American: 59 mL/min — ABNORMAL LOW
GFR, Est Non African American: 52 mL/min — ABNORMAL LOW
Glucose, Bld: 122 mg/dL — ABNORMAL HIGH (ref 70–99)
Potassium: 4.6 mEq/L (ref 3.5–5.3)
Sodium: 140 mEq/L (ref 135–145)
Total Bilirubin: 0.4 mg/dL (ref 0.3–1.2)
Total Protein: 7.3 g/dL (ref 6.0–8.3)

## 2012-02-09 LAB — LIPID PANEL
Cholesterol: 154 mg/dL (ref 0–200)
HDL: 37 mg/dL — ABNORMAL LOW (ref >39)
LDL Cholesterol: 77 mg/dL (ref 0–99)
Total CHOL/HDL Ratio: 4.2 Ratio
Triglycerides: 198 mg/dL — ABNORMAL HIGH (ref <150)
VLDL: 40 mg/dL (ref 0–40)

## 2012-02-10 LAB — MICROALBUMIN / CREATININE URINE RATIO
Creatinine, Urine: 209.3 mg/dL
Microalb Creat Ratio: 5.6 mg/g (ref 0.0–30.0)
Microalb, Ur: 1.18 mg/dL (ref 0.00–1.89)

## 2012-02-20 ENCOUNTER — Other Ambulatory Visit: Payer: Self-pay | Admitting: Family Medicine

## 2012-03-27 DIAGNOSIS — H409 Unspecified glaucoma: Secondary | ICD-10-CM | POA: Diagnosis not present

## 2012-03-27 DIAGNOSIS — H4011X0 Primary open-angle glaucoma, stage unspecified: Secondary | ICD-10-CM | POA: Diagnosis not present

## 2012-03-29 ENCOUNTER — Other Ambulatory Visit: Payer: Self-pay | Admitting: Family Medicine

## 2012-04-16 ENCOUNTER — Telehealth: Payer: Self-pay | Admitting: Family Medicine

## 2012-04-16 MED ORDER — INSULIN GLARGINE 100 UNIT/ML ~~LOC~~ SOLN: 35.0000 [IU] | Freq: Every day | SUBCUTANEOUS | Status: DC

## 2012-04-16 MED ORDER — METFORMIN HCL 1000 MG PO TABS: ORAL_TABLET | ORAL | Status: DC

## 2012-04-16 NOTE — Telephone Encounter (Signed)
Diabetic meds sent to [facility]

## 2012-04-23 ENCOUNTER — Other Ambulatory Visit: Payer: Self-pay

## 2012-04-23 ENCOUNTER — Other Ambulatory Visit: Payer: Self-pay | Admitting: Family Medicine

## 2012-04-23 MED ORDER — INSULIN PEN NEEDLE 31G X 8 MM MISC: Status: DC

## 2012-04-23 MED ORDER — INSULIN GLARGINE 100 UNIT/ML ~~LOC~~ SOLN: 35.0000 [IU] | Freq: Every day | SUBCUTANEOUS | Status: DC

## 2012-06-10 ENCOUNTER — Encounter (HOSPITAL_COMMUNITY): Payer: Self-pay | Admitting: *Deleted

## 2012-06-10 ENCOUNTER — Emergency Department (HOSPITAL_COMMUNITY)
Admission: EM | Admit: 2012-06-10 | Discharge: 2012-06-10 | Disposition: A | Discharge status: Home / Self Care | Payer: Medicare Other | Attending: Emergency Medicine | Admitting: Emergency Medicine

## 2012-06-10 DIAGNOSIS — R112 Nausea with vomiting, unspecified: Secondary | ICD-10-CM | POA: Diagnosis not present

## 2012-06-10 DIAGNOSIS — Z794 Long term (current) use of insulin: Secondary | ICD-10-CM | POA: Insufficient documentation

## 2012-06-10 DIAGNOSIS — E785 Hyperlipidemia, unspecified: Secondary | ICD-10-CM | POA: Insufficient documentation

## 2012-06-10 DIAGNOSIS — Z8719 Personal history of other diseases of the digestive system: Secondary | ICD-10-CM | POA: Diagnosis not present

## 2012-06-10 DIAGNOSIS — Z87448 Personal history of other diseases of urinary system: Secondary | ICD-10-CM | POA: Insufficient documentation

## 2012-06-10 DIAGNOSIS — F430 Acute stress reaction: Secondary | ICD-10-CM | POA: Insufficient documentation

## 2012-06-10 DIAGNOSIS — I10 Essential (primary) hypertension: Secondary | ICD-10-CM | POA: Insufficient documentation

## 2012-06-10 DIAGNOSIS — Z79899 Other long term (current) drug therapy: Secondary | ICD-10-CM | POA: Diagnosis not present

## 2012-06-10 DIAGNOSIS — R1110 Vomiting, unspecified: Secondary | ICD-10-CM | POA: Diagnosis not present

## 2012-06-10 DIAGNOSIS — E119 Type 2 diabetes mellitus without complications: Secondary | ICD-10-CM | POA: Insufficient documentation

## 2012-06-10 DIAGNOSIS — Z7982 Long term (current) use of aspirin: Secondary | ICD-10-CM | POA: Insufficient documentation

## 2012-06-10 DIAGNOSIS — R404 Transient alteration of awareness: Secondary | ICD-10-CM | POA: Diagnosis not present

## 2012-06-10 DIAGNOSIS — I499 Cardiac arrhythmia, unspecified: Secondary | ICD-10-CM | POA: Diagnosis not present

## 2012-06-10 DIAGNOSIS — R42 Dizziness and giddiness: Secondary | ICD-10-CM | POA: Diagnosis not present

## 2012-06-10 MED ORDER — ONDANSETRON HCL 4 MG PO TABS: 4.0000 mg | ORAL_TABLET | Freq: Four times a day (QID) | ORAL | Status: DC

## 2012-06-10 MED ORDER — SODIUM CHLORIDE 0.9 % IV BOLUS (SEPSIS)
500.0000 mL | Freq: Once | INTRAVENOUS | Status: AC
  Administered 2012-06-10: 500 mL via INTRAVENOUS

## 2012-06-10 MED ORDER — ONDANSETRON HCL 4 MG/2ML IJ SOLN
4.0000 mg | Freq: Once | INTRAMUSCULAR | Status: AC
  Administered 2012-06-10: 4 mg via INTRAVENOUS

## 2012-06-10 MED ORDER — ONDANSETRON HCL 4 MG/2ML IJ SOLN
INTRAMUSCULAR | Status: AC
  Filled 2012-06-10: qty 2

## 2012-06-10 NOTE — ED Provider Notes (Signed)
History     CSN: [id number]  Arrival date & time 06/10/12  0116   First MD Initiated Contact with Patient 06/10/12 0127      Chief Complaint  Patient presents with  . Nausea  . Emesis    (Consider location/radiation/quality/duration/timing/severity/associated sxs/prior treatment) HPIMinnie H Brown is a 72 y.o. female presenting with nausea and vomiting that began about 30 minutes ago.  Patient has been under extreme stress recently, she just buried her son today with the funeral, about 30 minutes ago she some food including beans and strawberries and then vomited up promptly. Has had no further episodes, no diarrhea, no sick contacts, no abdominal pain, no fevers, no chills, no chest pain no shortness of breath. She did feel little bit dizzy tonight on standing on multiple occasions and feels fine when resting recumbent. Denies any numbness or tingling, or weakness. Blood sugar per EMS is 201. She is feeling mildly nauseous and anxious, she is also fatigued she's not sleeping well.   Past Medical History  Diagnosis Date  . Diabetes mellitus   . GERD (gastroesophageal reflux disease)   . Hyperlipidemia   . Dysrhythmia   . Blood transfusion     1980  . Hypertension     eccho and stress 4/10 reports on chart, EKG ` LOV 9/12 on chart  . Female bladder prolapse     Past Surgical History  Procedure Date  . Abdominal hysterectomy   . Ovarian tumors removed bilateral   . Thyroidectomy   . Lymph nodes removed under right arm     right  . Cholecystectomy   . Thyroidectomy   . Colonoscopy w/ polypectomy   . Eye surgery     bilateral cataract extraction with IOL  . Anterior and posterior repair 04/26/2011    Procedure: ANTERIOR (CYSTOCELE) AND POSTERIOR REPAIR (RECTOCELE);  Surgeon: Joanna Sinner, MD;  Location: [facility];  Service: Urology;  Laterality: N/A;  . Vaginal prolapse repair 04/26/2011    Procedure: VAGINAL VAULT SUSPENSION;  Surgeon: Joanna Sinner, MD;   Location: [facility];  Service: Urology;  Laterality: N/A;  with Graft  10x6    Family History  Problem Relation Age of Onset  . Heart disease Mother     MI  . Cancer Mother   . Heart disease Father     MI  . Cancer Sister     liver  . Cancer Sister     bone    History  Substance Use Topics  . Smoking status: Never Smoker   . Smokeless tobacco: Never Used  . Alcohol Use: No     Comment: socially- none x 30 years    OB History    Grav Para Term Preterm Abortions TAB SAB Ect Mult Living                  Review of Systems At least 10pt or greater review of systems completed and are negative except where specified in the HPI.  Allergies  Review of patient's allergies indicates no known allergies.  Home Medications   Current Outpatient Rx  Name  Route  Sig  Dispense  Refill  . AMLODIPINE BESYLATE 10 MG PO TABS   Oral   Take 1 tablet (10 mg total) by mouth every morning.   30 tablet   6   . ASPIRIN 81 MG PO TABS   Oral   Take 81 mg by mouth daily.         Marland Kitchen  BD PEN NEEDLE SHORT U/F 31G X 8 MM MISC      USE ONCE DAILY WITH LANTUS   100 each   1   . BENAZEPRIL HCL 40 MG PO TABS   Oral   Take 1 tablet (40 mg total) by mouth daily.   90 tablet   1   . BENAZEPRIL HCL 40 MG PO TABS      TAKE 1 TABLET (40 MG TOTAL) BY MOUTH DAILY.   90 tablet   1   . CLONIDINE HCL 0.2 MG PO TABS   Oral   Take 1 tablet (0.2 mg total) by mouth 2 (two) times daily.   60 tablet   5   . FERROUS SULFATE 325 (65 FE) MG PO TABS   Oral   Take 325 mg by mouth daily with breakfast.         . INSULIN GLARGINE 100 UNIT/ML [place] SOLN   Subcutaneous   Inject 35 Units into the skin at bedtime.   10 mL   2   . INSULIN GLARGINE 100 UNIT/ML [place] SOLN   Subcutaneous   Inject 35 Units into the skin at bedtime.   9 mL   0   . INSULIN PEN NEEDLE 31G X 8 MM MISC      For use with insulin pens   150 each   5   . INSULIN SYRINGES (DISPOSABLE) U-100 0.3 ML MISC   Subcutaneous    Inject 1 Syringe into the skin as needed.   100 each   0   . LOVASTATIN 40 MG PO TABS   Oral   Take 1 tablet (40 mg total) by mouth at bedtime.   30 tablet   6   . METFORMIN HCL 1000 MG PO TABS      TAKE 1 TABLET (1,000 MG TOTAL) BY MOUTH 2 (TWO) TIMES DAILY WITH A MEAL.   180 tablet   0   . CENTRUM PO TABS   Oral   Take 1 tablet by mouth daily.         . TRAVOPROST (BAK FREE) 0.004 % OP SOLN   Both Eyes   Place 1 drop into both eyes at bedtime.          . TRIAMTERENE-HCTZ 75-50 MG PO TABS   Oral   Take 1 tablet by mouth daily.   30 tablet   11     Dose increase effective 06/09/2011     BP 118/72  Pulse 76  Temp 98.2 F (36.8 C) (Oral)  Resp 16  Ht 5\' 5"  (1.651 m)  Wt 175 lb (79.379 kg)  BMI 29.12 kg/m2  SpO2 97%  Physical Exam  Nursing notes reviewed.  Electronic medical record reviewed. VITAL SIGNS:   Filed Vitals:   06/10/12 0118 06/10/12 0419  BP: 118/72 101/64  Pulse: 76 74  Temp: 98.2 F (36.8 C)   TempSrc: Oral   Resp: 16 20  Height: 5\' 5"  (1.651 m)   Weight: 175 lb (79.379 kg)   SpO2: 97% 95%   CONSTITUTIONAL: Awake, oriented, appears non-toxic HENT: Atraumatic, normocephalic, oral mucosa pink and moist, airway patent. Nares patent without drainage. External ears normal. EYES: Conjunctiva clear, EOMI, PERRLA NECK: Trachea midline, non-tender, supple CARDIOVASCULAR: Normal heart rate, Normal rhythm, No rubs, gallops.  Murmur 3/6 RUSB - known AS PULMONARY/CHEST: Clear to auscultation, no rhonchi, wheezes, or rales. Symmetrical breath sounds. Non-tender. ABDOMINAL: Non-distended, soft, non-tender - no rebound or guarding.  BS normal. NEUROLOGIC:  Non-focal, moving all four extremities, no gross sensory or motor deficits. EXTREMITIES: No clubbing, cyanosis, or edema SKIN: Warm, Dry, No erythema, No rash  ED Course  Procedures (including critical care time)  Labs Reviewed - No data to display No results found.   1. Nausea and  vomiting   2. Acute stress reaction       MDM  Joanna Brown is a 72 y.o. female presenting with nausea and vomiting x1, glucose is within normal limits is diabetic 201, patient does have some aortic stenosis -she is a cardiologist is watching over this, she says and dizziness but is been orthostatic, she really has not been taking in fluids surrounding the death of her son, she has been staying up late not been able to sleep. Patient is feeling much better with IV Zofran, given her small amount of fluids allow her to rest and reevaluate.  Reevaluate patient, she says she's been much better like to go home. No labs or imaging is indicated at this time.  I explained the diagnosis and have given explicit precautions to return to the ER including any other new or worsening symptoms. The patient understands and accepts the medical plan as it's been dictated and I have answered their questions. Discharge instructions concerning home care and prescriptions have been given.  The patient is STABLE and is discharged to home in good condition.         Jones Skene, MD 06/10/12 2307

## 2012-06-10 NOTE — ED Notes (Signed)
Pt reporting nausea and vomiting began about 30 min ago. Pt denies fever or feeling ill prior to today. Denies abdominal pain.  Pt reports eating beans and strawberries about 1 hour prior to vomiting.  Blood sugar done by EMS, result 201.

## 2012-06-10 NOTE — ED Notes (Signed)
Pt discharged. Pt stable at time of discharge. Medications reviewed pt has no questions regarding discharge at this time. Pt voiced understanding of discharge instructions.  

## 2012-06-11 ENCOUNTER — Encounter: Payer: Self-pay | Admitting: Family Medicine

## 2012-06-11 ENCOUNTER — Other Ambulatory Visit: Payer: Self-pay | Admitting: Family Medicine

## 2012-06-11 ENCOUNTER — Ambulatory Visit (INDEPENDENT_AMBULATORY_CARE_PROVIDER_SITE_OTHER): Payer: Medicare Other | Admitting: Family Medicine

## 2012-06-11 VITALS — BP 140/80 | HR 68 | Resp 16 | Ht 65.0 in | Wt 173.0 lb

## 2012-06-11 DIAGNOSIS — R55 Syncope and collapse: Secondary | ICD-10-CM | POA: Diagnosis not present

## 2012-06-11 DIAGNOSIS — R5381 Other malaise: Secondary | ICD-10-CM

## 2012-06-11 DIAGNOSIS — E119 Type 2 diabetes mellitus without complications: Secondary | ICD-10-CM

## 2012-06-11 DIAGNOSIS — I10 Essential (primary) hypertension: Secondary | ICD-10-CM | POA: Diagnosis not present

## 2012-06-11 DIAGNOSIS — E669 Obesity, unspecified: Secondary | ICD-10-CM

## 2012-06-11 DIAGNOSIS — R7402 Elevation of levels of lactic acid dehydrogenase (LDH): Secondary | ICD-10-CM | POA: Diagnosis not present

## 2012-06-11 DIAGNOSIS — M62838 Other muscle spasm: Secondary | ICD-10-CM | POA: Diagnosis not present

## 2012-06-11 DIAGNOSIS — E785 Hyperlipidemia, unspecified: Secondary | ICD-10-CM

## 2012-06-11 DIAGNOSIS — R7401 Elevation of levels of liver transaminase levels: Secondary | ICD-10-CM | POA: Diagnosis not present

## 2012-06-11 DIAGNOSIS — R51 Headache: Secondary | ICD-10-CM

## 2012-06-11 DIAGNOSIS — R0989 Other specified symptoms and signs involving the circulatory and respiratory systems: Secondary | ICD-10-CM

## 2012-06-11 LAB — COMPLETE METABOLIC PANEL WITH GFR
ALT: 40 U/L — ABNORMAL HIGH (ref 0–35)
AST: 54 U/L — ABNORMAL HIGH (ref 0–37)
Albumin: 4.4 g/dL (ref 3.5–5.2)
Alkaline Phosphatase: 42 U/L (ref 39–117)
BUN: 15 mg/dL (ref 6–23)
CO2: 32 mEq/L (ref 19–32)
Calcium: 10.1 mg/dL (ref 8.4–10.5)
Chloride: 103 mEq/L (ref 96–112)
Creat: 1.25 mg/dL — ABNORMAL HIGH (ref 0.50–1.10)
GFR, Est African American: 50 mL/min — ABNORMAL LOW
GFR, Est Non African American: 44 mL/min — ABNORMAL LOW
Glucose, Bld: 104 mg/dL — ABNORMAL HIGH (ref 70–99)
Potassium: 4.3 mEq/L (ref 3.5–5.3)
Sodium: 141 mEq/L (ref 135–145)
Total Bilirubin: 0.5 mg/dL (ref 0.3–1.2)
Total Protein: 7.3 g/dL (ref 6.0–8.3)

## 2012-06-11 LAB — CBC WITH DIFFERENTIAL/PLATELET
Basophils Absolute: 0 K/uL (ref 0.0–0.1)
Basophils Relative: 0 % (ref 0–1)
Eosinophils Absolute: 0.2 K/uL (ref 0.0–0.7)
Eosinophils Relative: 3 % (ref 0–5)
HCT: 38.5 % (ref 36.0–46.0)
Hemoglobin: 13.3 g/dL (ref 12.0–15.0)
Lymphocytes Relative: 41 % (ref 12–46)
Lymphs Abs: 3 K/uL (ref 0.7–4.0)
MCH: 28.7 pg (ref 26.0–34.0)
MCHC: 34.5 g/dL (ref 30.0–36.0)
MCV: 83.2 fL (ref 78.0–100.0)
Monocytes Absolute: 0.4 K/uL (ref 0.1–1.0)
Monocytes Relative: 6 % (ref 3–12)
Neutro Abs: 3.6 K/uL (ref 1.7–7.7)
Neutrophils Relative %: 50 % (ref 43–77)
Platelets: 213 K/uL (ref 150–400)
RBC: 4.63 MIL/uL (ref 3.87–5.11)
RDW: 14.8 % (ref 11.5–15.5)
WBC: 7.3 K/uL (ref 4.0–10.5)

## 2012-06-11 LAB — LIPID PANEL
Cholesterol: 157 mg/dL (ref 0–200)
HDL: 41 mg/dL (ref >39)
LDL Cholesterol: 90 mg/dL (ref 0–99)
Total CHOL/HDL Ratio: 3.8 ratio
Triglycerides: 131 mg/dL (ref <150)
VLDL: 26 mg/dL (ref 0–40)

## 2012-06-11 LAB — HEMOGLOBIN A1C
Hgb A1c MFr Bld: 7.9 % — ABNORMAL HIGH (ref <5.7)
Mean Plasma Glucose: 180 mg/dL — ABNORMAL HIGH (ref <117)

## 2012-06-11 MED ORDER — TRIAMTERENE-HCTZ 75-50 MG PO TABS: 1.0000 | ORAL_TABLET | Freq: Every day | ORAL | Status: DC

## 2012-06-11 MED ORDER — TIZANIDINE HCL 2 MG PO TABS: ORAL_TABLET | ORAL | Status: DC

## 2012-06-11 MED ORDER — CLONIDINE HCL 0.2 MG PO TABS: ORAL_TABLET | ORAL | Status: DC

## 2012-06-11 NOTE — Assessment & Plan Note (Signed)
Hyperlipidemia:Low fat diet discussed and encouraged.  Triglycerides slightly elevated when l;ast checked

## 2012-06-11 NOTE — Patient Instructions (Addendum)
Annual wellness to be rescheduled  To end March  Tylenol 2 tabs in office today for neck pain and muscle relaxant sent in for bedtime use only.  HBA1C cmp and EGFR , lipid, cbc today  Reduce clonidine to ONE at bedtime , pharmacy has been notified.  You will be referred for a brain scan due to h/o loss of conciousness, neurologic exam today is normal.  Please accept my condolence on the passing of your son

## 2012-06-11 NOTE — Assessment & Plan Note (Signed)
recent syncope with LOC and possible bruit eval for carotid stenosis

## 2012-06-11 NOTE — Assessment & Plan Note (Signed)
S/p recent head traum tylenol in office

## 2012-06-11 NOTE — Assessment & Plan Note (Signed)
Low dose muscle relaxant at bedtime for as needed use

## 2012-06-11 NOTE — Assessment & Plan Note (Signed)
Patient advised to reduce carb and sweets, commit to regular physical activity, take meds as prescribed, test blood as directed, and attempt to lose weight, to improve blood sugar control. Updated lab needed   

## 2012-06-11 NOTE — Assessment & Plan Note (Signed)
Controlled on reduced dose of clonidine though still not at optimal value, will retain lower dose clonidine at this time

## 2012-06-11 NOTE — Progress Notes (Signed)
  Subjective:    Patient ID: Joanna Brown, female    DOB: 1940-05-30, 72 y.o.   MRN: [id number]  HPI Pt in for Ed follow up following an acute episode of light headedness, dizziness and states she actually passed out lost conciousnes, this was accompanied by nausea and vomittng, none since, dizzines is not as severe, c/o posterior neck pain and spasm No h./o incontinence , seizure activity, duration about 15 secs.the event was witnessed by her spouse States she hit her head, still having neck pain and stiffness. Dry cough for 4 days before, no fever or chills Two week h/o increased stress culminating in the death of her 110 y/o son   Review of Systems See HPI Denies recent fever or chills. Denies sinus pressure, nasal congestion, ear pain or sore throat. Denies chest congestion, productive cough or wheezing. Denies chest pains, palpitations and leg swelling Denies abdominal pain, nausea, vomiting,diarrhea or constipation.   Denies dysuria, frequency, hesitancy or incontinence. Denies joint pain, swelling and limitation in mobility.  Denies skin break down or rash.        Objective:   Physical Exam Patient alert and oriented and in no cardiopulmonary distress.  HEENT: No facial asymmetry, EOMI, no sinus tenderness,  oropharynx pink and moist.  Neck decreased though adequate ROM with spasm, no jVD no adenopathy.Bruit  Chest: Clear to auscultation bilaterally.  CVS: S1, S2 no murmurs, no S3.  ABD: Soft non tender. Bowel sounds normal.  Ext: No edema  MS: Adequate ROM  shoulders, hips and knees.  Skin: Intact, no ulcerations or rash noted.  Psych: Good eye contact, normal affect. Memory intact not anxious or depressed appearing.  CNS: CN 2-12 intact, power, tone and sensation normal throughout.        Assessment & Plan:

## 2012-06-12 ENCOUNTER — Other Ambulatory Visit: Payer: Self-pay | Admitting: Family Medicine

## 2012-06-12 DIAGNOSIS — R748 Abnormal levels of other serum enzymes: Secondary | ICD-10-CM | POA: Insufficient documentation

## 2012-06-13 LAB — HEPATITIS PANEL, ACUTE
HCV Ab: NEGATIVE
Hep A IgM: NEGATIVE
Hep B C IgM: NEGATIVE
Hepatitis B Surface Ag: NEGATIVE

## 2012-06-18 ENCOUNTER — Ambulatory Visit (HOSPITAL_COMMUNITY)
Admission: RE | Admit: 2012-06-18 | Discharge: 2012-06-18 | Disposition: A | Discharge status: Home / Self Care | Payer: Medicare Other | Source: Ambulatory Visit | Attending: Family Medicine | Admitting: Family Medicine

## 2012-06-18 ENCOUNTER — Other Ambulatory Visit: Payer: Self-pay | Admitting: Family Medicine

## 2012-06-18 DIAGNOSIS — R55 Syncope and collapse: Secondary | ICD-10-CM | POA: Insufficient documentation

## 2012-06-18 DIAGNOSIS — R0989 Other specified symptoms and signs involving the circulatory and respiratory systems: Secondary | ICD-10-CM

## 2012-06-18 DIAGNOSIS — I10 Essential (primary) hypertension: Secondary | ICD-10-CM | POA: Insufficient documentation

## 2012-06-18 DIAGNOSIS — E119 Type 2 diabetes mellitus without complications: Secondary | ICD-10-CM | POA: Diagnosis not present

## 2012-06-18 DIAGNOSIS — I658 Occlusion and stenosis of other precerebral arteries: Secondary | ICD-10-CM | POA: Diagnosis not present

## 2012-06-18 DIAGNOSIS — Z139 Encounter for screening, unspecified: Secondary | ICD-10-CM

## 2012-06-19 ENCOUNTER — Other Ambulatory Visit: Payer: Self-pay

## 2012-06-19 MED ORDER — GLIPIZIDE ER 5 MG PO TB24: 5.0000 mg | ORAL_TABLET | Freq: Every day | ORAL | Status: DC

## 2012-06-21 ENCOUNTER — Other Ambulatory Visit: Payer: Self-pay | Admitting: Family Medicine

## 2012-07-17 DIAGNOSIS — I359 Nonrheumatic aortic valve disorder, unspecified: Secondary | ICD-10-CM | POA: Diagnosis not present

## 2012-07-17 DIAGNOSIS — E782 Mixed hyperlipidemia: Secondary | ICD-10-CM | POA: Diagnosis not present

## 2012-07-17 DIAGNOSIS — I10 Essential (primary) hypertension: Secondary | ICD-10-CM | POA: Diagnosis not present

## 2012-07-20 ENCOUNTER — Other Ambulatory Visit: Payer: Self-pay | Admitting: Family Medicine

## 2012-07-25 ENCOUNTER — Ambulatory Visit: Payer: Self-pay | Admitting: Family Medicine

## 2012-08-06 DIAGNOSIS — Z1231 Encounter for screening mammogram for malignant neoplasm of breast: Secondary | ICD-10-CM | POA: Diagnosis not present

## 2012-08-09 ENCOUNTER — Ambulatory Visit: Payer: Medicare Other | Admitting: Family Medicine

## 2012-08-15 ENCOUNTER — Encounter: Payer: Self-pay | Admitting: Family Medicine

## 2012-08-15 ENCOUNTER — Ambulatory Visit (INDEPENDENT_AMBULATORY_CARE_PROVIDER_SITE_OTHER): Payer: Medicare Other | Admitting: Family Medicine

## 2012-08-15 VITALS — BP 124/72 | HR 80 | Resp 18 | Ht 65.0 in | Wt 172.0 lb

## 2012-08-15 DIAGNOSIS — E785 Hyperlipidemia, unspecified: Secondary | ICD-10-CM

## 2012-08-15 DIAGNOSIS — E119 Type 2 diabetes mellitus without complications: Secondary | ICD-10-CM | POA: Diagnosis not present

## 2012-08-15 DIAGNOSIS — Z0000 Encounter for general adult medical examination without abnormal findings: Secondary | ICD-10-CM

## 2012-08-15 MED ORDER — BENAZEPRIL HCL 40 MG PO TABS: 40.0000 mg | ORAL_TABLET | Freq: Every day | ORAL | Status: DC

## 2012-08-15 NOTE — Patient Instructions (Addendum)
F/U  First week in October, call if you need me before  Non fasting hBA1C cmp and eGFR fiirst week in May  pls attend diabetic group session as soon as possible you will get the schedule   Fasting lipid, cmp and EGFr, microalb  September 27 or after  It is important that you exercise regularly at least 30 minutes 5 times a week. If you develop chest pain, have severe difficulty breathing, or feel very tired, stop exercising immediately and seek medical attention   A healthy diet is rich in fruit, vegetables and whole grains. Poultry fish, nuts and beans are a healthy choice for protein rather then red meat. A low sodium diet and drinking 64 ounces of water daily is generally recommended. Oils and sweet should be limited. Carbohydrates especially for those who are diabetic or overweight, should be limited to 0-45 gram per meal. It is important to eat on a regular schedule, at least 3 times daily. Snacks should be primarily fruits, vegetables or nuts.

## 2012-08-15 NOTE — Assessment & Plan Note (Addendum)
Annual wellness as documented.She is fully functional, needs to become involved in seniors grp and community some more.She is interested Pt commited to healthy eatiing, will attend diabetic class

## 2012-08-15 NOTE — Progress Notes (Signed)
Subjective:    Patient ID: Joanna Brown, female    DOB: 02-16-1941, 72 y.o.   MRN: [id number]  HPI  Preventive Screening-Counseling & Management   Patient present here today for a Medicare annual wellness visit.   Current Problems (verified)   Medications Prior to Visit Allergies (verified)   PAST HISTORY  Family History 3 siblings deceased , 3 sisters living, one sisiter had myeloma, the other liver cancer 3 living sibs have hTN  Social History Married x 37  Years , mom of 1 son who died at age 46 in 09/30/2011, unexpectedly No alcohol, cigarettes or street drugs Retired from [employer] service age 54   Risk Factors  Current exercise habits:  Walks 5 days per week for 35 to 40 minutes  Dietary issues discussed:vegetables, fish   Cardiac risk factors: diabetes  Depression Screen  (Note: if answer to either of the following is "Yes", a more complete depression screening is indicated)   Over the past two weeks, have you felt down, depressed or hopeless? No  Over the past two weeks, have you felt little interest or pleasure in doing things? No  Have you lost interest or pleasure in daily life? No  Do you often feel hopeless? No  Do you cry easily over simple problems? No   Activities of Daily Living  In your present state of health, do you have any difficulty performing the following activities?  Driving?: No Managing money?: No Feeding yourself?:No Getting from bed to chair?:No Climbing a flight of stairs?:No Preparing food and eating?:No Bathing or showering?:No Getting dressed?:No Getting to the toilet?:No Using the toilet?:No Moving around from place to place?: No  Fall Risk Assessment In the past year have you fallen or had a near fall?:No Are you currently taking any medications that make you dizziness?:No   Hearing Difficulties: No Do you often ask people to speak up or repeat themselves?:No Do you experience ringing or noises in your ears?:No Do you have  difficulty understanding soft or whispered voices?:No  Cognitive Testing  Alert? Yes Normal Appearance?Yes  Oriented to person? Yes Place? Yes  Time? Yes  Displays appropriate judgment?Yes  Can read the correct time from a watch face? yes Are you having problems remembering things?No  Advanced Directives have been discussed with the patient?Yes , full code   List the Names of Other Physician/Practitioners you currently use: dr Nile Riggs, Dr Claud Kelp any recent Medical Services you may have received from other than [facility] providers in the past year (date may be approximate).   Assessment:    Annual Wellness Exam   Plan:    During the course of the visit the patient was educated and counseled about appropriate screening and preventive services including:  A healthy diet is rich in fruit, vegetables and whole grains. Poultry fish, nuts and beans are a healthy choice for protein rather then red meat. A low sodium diet and drinking 64 ounces of water daily is generally recommended. Oils and sweet should be limited. Carbohydrates especially for those who are diabetic or overweight, should be limited to 30-45 gram per meal. It is important to eat on a regular schedule, at least 3 times daily. Snacks should be primarily fruits, vegetables or nuts. It is important that you exercise regularly at least 30 minutes 5 times a week. If you develop chest pain, have severe difficulty breathing, or feel very tired, stop exercising immediately and seek medical attention  Immunization reviewed and updated. Cancer screening reviewed  and updated    Patient Instructions (the written plan) was given to the patient.  Medicare Attestation  I have personally reviewed:  The patient's medical and social history  Their use of alcohol, tobacco or illicit drugs  Their current medications and supplements  The patient's functional ability including ADLs,fall risks, home safety risks, cognitive, and  hearing and visual impairment  Diet and physical activities  Evidence for depression or mood disorders  The patient's weight, height, BMI, and visual acuity have been recorded in the chart. I have made referrals, counseling, and provided education to the patient based on review of the above and I have provided the patient with a written personalized care plan for preventive services.      Review of Systems     Objective:   Physical Exam        Assessment & Plan:

## 2012-09-18 DIAGNOSIS — E119 Type 2 diabetes mellitus without complications: Secondary | ICD-10-CM | POA: Diagnosis not present

## 2012-09-18 LAB — COMPLETE METABOLIC PANEL WITH GFR
ALT: 22 U/L (ref 0–35)
AST: 24 U/L (ref 0–37)
Albumin: 4 g/dL (ref 3.5–5.2)
Alkaline Phosphatase: 37 U/L — ABNORMAL LOW (ref 39–117)
BUN: 14 mg/dL (ref 6–23)
CO2: 29 mEq/L (ref 19–32)
Calcium: 9.3 mg/dL (ref 8.4–10.5)
Chloride: 102 mEq/L (ref 96–112)
Creat: 1.02 mg/dL (ref 0.50–1.10)
GFR, Est African American: 64 mL/min
GFR, Est Non African American: 56 mL/min — ABNORMAL LOW
Glucose, Bld: 124 mg/dL — ABNORMAL HIGH (ref 70–99)
Potassium: 4 mEq/L (ref 3.5–5.3)
Sodium: 141 mEq/L (ref 135–145)
Total Bilirubin: 0.4 mg/dL (ref 0.3–1.2)
Total Protein: 6.9 g/dL (ref 6.0–8.3)

## 2012-09-18 LAB — HEMOGLOBIN A1C
Hgb A1c MFr Bld: 6.2 % — ABNORMAL HIGH (ref <5.7)
Mean Plasma Glucose: 131 mg/dL — ABNORMAL HIGH (ref <117)

## 2012-09-26 ENCOUNTER — Telehealth: Payer: Self-pay

## 2012-09-26 ENCOUNTER — Other Ambulatory Visit: Payer: Self-pay | Admitting: Family Medicine

## 2012-09-26 NOTE — Telephone Encounter (Signed)
Pt waiting on lab results (she called a few days ago and I gave her results but she wants them mailed with your comments) some were abnormal and there were no result notes

## 2012-09-26 NOTE — Telephone Encounter (Signed)
Spoke with pt kidney and liver improved, Blood sugar over corrected. She reports in past week off insulin fasting sugar has been under 140, will d/c same, shwe is to call back if numbers get out of range

## 2012-09-26 NOTE — Telephone Encounter (Signed)
Spoke directly with pt. 

## 2012-10-06 ENCOUNTER — Other Ambulatory Visit: Payer: Self-pay | Admitting: Family Medicine

## 2012-12-20 ENCOUNTER — Other Ambulatory Visit: Payer: Self-pay | Admitting: Family Medicine

## 2013-01-01 ENCOUNTER — Other Ambulatory Visit: Payer: Self-pay | Admitting: Family Medicine

## 2013-02-11 ENCOUNTER — Other Ambulatory Visit: Payer: Self-pay | Admitting: Family Medicine

## 2013-02-11 DIAGNOSIS — E119 Type 2 diabetes mellitus without complications: Secondary | ICD-10-CM | POA: Diagnosis not present

## 2013-02-11 DIAGNOSIS — E785 Hyperlipidemia, unspecified: Secondary | ICD-10-CM | POA: Diagnosis not present

## 2013-02-11 LAB — COMPLETE METABOLIC PANEL WITH GFR
ALT: 24 U/L (ref 0–35)
AST: 19 U/L (ref 0–37)
Albumin: 4 g/dL (ref 3.5–5.2)
Alkaline Phosphatase: 39 U/L (ref 39–117)
BUN: 22 mg/dL (ref 6–23)
CO2: 27 mEq/L (ref 19–32)
Calcium: 9.8 mg/dL (ref 8.4–10.5)
Chloride: 100 mEq/L (ref 96–112)
Creat: 1.11 mg/dL — ABNORMAL HIGH (ref 0.50–1.10)
GFR, Est African American: 58 mL/min — ABNORMAL LOW
GFR, Est Non African American: 50 mL/min — ABNORMAL LOW
Glucose, Bld: 157 mg/dL — ABNORMAL HIGH (ref 70–99)
Potassium: 3.7 mEq/L (ref 3.5–5.3)
Sodium: 136 mEq/L (ref 135–145)
Total Bilirubin: 0.3 mg/dL (ref 0.3–1.2)
Total Protein: 7 g/dL (ref 6.0–8.3)

## 2013-02-11 LAB — LIPID PANEL
Cholesterol: 224 mg/dL — ABNORMAL HIGH (ref 0–200)
HDL: 32 mg/dL — ABNORMAL LOW (ref >39)
LDL Cholesterol: 132 mg/dL — ABNORMAL HIGH (ref 0–99)
Total CHOL/HDL Ratio: 7 Ratio
Triglycerides: 301 mg/dL — ABNORMAL HIGH (ref <150)
VLDL: 60 mg/dL — ABNORMAL HIGH (ref 0–40)

## 2013-02-12 LAB — MICROALBUMIN / CREATININE URINE RATIO
Creatinine, Urine: 178.7 mg/dL
Microalb Creat Ratio: 2.8 mg/g (ref 0.0–30.0)
Microalb, Ur: 0.5 mg/dL (ref 0.00–1.89)

## 2013-02-12 NOTE — Addendum Note (Signed)
Addended by: Abner Greenspan on: 02/12/2013 01:37 PM   Modules accepted: Orders

## 2013-02-19 ENCOUNTER — Other Ambulatory Visit: Payer: Self-pay | Admitting: Family Medicine

## 2013-02-19 DIAGNOSIS — E119 Type 2 diabetes mellitus without complications: Secondary | ICD-10-CM | POA: Diagnosis not present

## 2013-02-19 LAB — HEMOGLOBIN A1C
Hgb A1c MFr Bld: 7.7 % — ABNORMAL HIGH (ref <5.7)
Mean Plasma Glucose: 174 mg/dL — ABNORMAL HIGH (ref <117)

## 2013-02-20 ENCOUNTER — Encounter: Payer: Self-pay | Admitting: Family Medicine

## 2013-02-20 ENCOUNTER — Encounter (INDEPENDENT_AMBULATORY_CARE_PROVIDER_SITE_OTHER): Payer: Self-pay

## 2013-02-20 ENCOUNTER — Ambulatory Visit (INDEPENDENT_AMBULATORY_CARE_PROVIDER_SITE_OTHER): Payer: Medicare Other | Admitting: Family Medicine

## 2013-02-20 VITALS — BP 118/72 | HR 78 | Resp 16 | Ht 65.0 in | Wt 177.1 lb

## 2013-02-20 DIAGNOSIS — Z1211 Encounter for screening for malignant neoplasm of colon: Secondary | ICD-10-CM | POA: Diagnosis not present

## 2013-02-20 DIAGNOSIS — I10 Essential (primary) hypertension: Secondary | ICD-10-CM

## 2013-02-20 DIAGNOSIS — R51 Headache: Secondary | ICD-10-CM | POA: Diagnosis not present

## 2013-02-20 DIAGNOSIS — E119 Type 2 diabetes mellitus without complications: Secondary | ICD-10-CM

## 2013-02-20 DIAGNOSIS — E785 Hyperlipidemia, unspecified: Secondary | ICD-10-CM

## 2013-02-20 DIAGNOSIS — R5381 Other malaise: Secondary | ICD-10-CM | POA: Diagnosis not present

## 2013-02-20 DIAGNOSIS — E663 Overweight: Secondary | ICD-10-CM

## 2013-02-20 DIAGNOSIS — Z6825 Body mass index (BMI) 25.0-25.9, adult: Secondary | ICD-10-CM

## 2013-02-20 DIAGNOSIS — R519 Headache, unspecified: Secondary | ICD-10-CM | POA: Insufficient documentation

## 2013-02-20 LAB — HEMOCCULT GUIAC POC 1CARD (OFFICE): Fecal Occult Blood, POC: NEGATIVE

## 2013-02-20 MED ORDER — TRIAMTERENE-HCTZ 75-50 MG PO TABS: 1.0000 | ORAL_TABLET | Freq: Every day | ORAL | Status: DC

## 2013-02-20 MED ORDER — AMLODIPINE BESYLATE 10 MG PO TABS: ORAL_TABLET | ORAL | Status: DC

## 2013-02-20 MED ORDER — GLIPIZIDE ER 5 MG PO TB24: ORAL_TABLET | ORAL | Status: DC

## 2013-02-20 MED ORDER — PRAVASTATIN SODIUM 40 MG PO TABS: 40.0000 mg | ORAL_TABLET | Freq: Every evening | ORAL | Status: DC

## 2013-02-20 MED ORDER — PANTOPRAZOLE SODIUM 20 MG PO TBEC: DELAYED_RELEASE_TABLET | ORAL | Status: DC

## 2013-02-20 MED ORDER — BENAZEPRIL HCL 40 MG PO TABS: 40.0000 mg | ORAL_TABLET | Freq: Every day | ORAL | Status: DC

## 2013-02-20 MED ORDER — CLONIDINE HCL 0.2 MG PO TABS: ORAL_TABLET | ORAL | Status: DC

## 2013-02-20 NOTE — Progress Notes (Signed)
  Subjective:    Patient ID: Joanna Brown, female    DOB: September 06, 1940, 72 y.o.   MRN: [id number]  HPI The PT is here for follow up and re-evaluation of chronic medical conditions, medication management and review of any available recent lab and radiology data.  Preventive health is updated, specifically  Cancer screening and Immunization. Refusing immunization   The PT denies any adverse reactions to current medications since the last visit.  Headaches which awake her from her sleep started in February, have gotten more severe, and have occurred on average every 2 weeks, this morning was an 8, tends to be sharp headache over the left forehead to crown, cold compress relieves the pain, duration max of 10 minutes, sometimes has nausea with the headache, no other symptoms Has been overeating carbs and sweets and not taking statin. Denies polyuria, polydipsia or blurred vision      Review of Systems    See HPI Denies recent fever or chills. Denies sinus pressure, nasal congestion, ear pain or sore throat. Denies chest congestion, productive cough or wheezing. Denies chest pains, palpitations and leg swelling Denies abdominal pain, nausea, vomiting,diarrhea or constipation.   Denies dysuria, frequency, hesitancy or incontinence. Denies joint pain, swelling and limitation in mobility. Denies numbness, or tingling. Denies depression, anxiety or insomnia. Denies skin break down or rash.     Objective:   Physical Exam Patient alert and oriented and in no cardiopulmonary distress.  HEENT: No facial asymmetry, EOMI, no sinus tenderness,  oropharynx pink and moist.  Neck supple no adenopathy.Fundoscopy: no hemorhage or exudate noted  Chest: Clear to auscultation bilaterally.  CVS: S1, S2 no murmurs, no S3.  ABD: Soft non tender. Bowel sounds normal. Rectal: no mass, heme negative stool Ext: No edema  MS: Adequate ROM spine, shoulders, hips and knees.  Skin: Intact, no  ulcerations or rash noted.  Psych: Good eye contact, normal affect. Memory intact not anxious or depressed appearing.  CNS: CN 2-12 intact, power, tone and sensation normal throughout.        Assessment & Plan:

## 2013-02-20 NOTE — Patient Instructions (Addendum)
F/u  Mid January, call if you need me before  Rectal exam today  Please reconsider the shingles and flu vaccines you need both  Please change eating habits, so that cholesterol and bloodsugar improve. Start pravachol every day for cholesterol also.  You are referred for brain scan, to see a neurologist and also for your eye exam which is past due, you need to get all of this done  Fasting lipid, cmp and EGFr and hBA1c, TSH in January before visit please  It is important that you exercise regularly at least 30 minutes 5 times a week. If you develop chest pain, have severe difficulty breathing, or feel very tired, stop exercising immediately and seek medical attention

## 2013-02-23 NOTE — Assessment & Plan Note (Signed)
Controlled, no change in medication DASH diet and commitment to daily physical activity for a minimum of 30 minutes discussed and encouraged, as a part of hypertension management. The importance of attaining a healthy weight is also discussed.  

## 2013-02-23 NOTE — Assessment & Plan Note (Signed)
Uncontrolled  Hyperlipidemia:Low fat diet discussed and encouraged.  Pt to resume med, has been non compliant

## 2013-02-23 NOTE — Assessment & Plan Note (Signed)
Unchanged. Patient re-educated about  the importance of commitment to a  minimum of 150 minutes of exercise per week. The importance of healthy food choices with portion control discussed. Encouraged to start a food diary, count calories and to consider  joining a support group. Sample diet sheets offered. Goals set by the patient for the next several months.    

## 2013-02-23 NOTE — Assessment & Plan Note (Signed)
Not as well controlled, target is 7 to 7.5  No med change, closer attention to diet, pt reports she has been eating excess carbs in recent times

## 2013-02-23 NOTE — Assessment & Plan Note (Signed)
New onset severe headaches which at times awaken her, needs imaging and neuro eval discussd red flag signs of loss of strength sensation or vision , re need to go tom ED

## 2013-02-28 ENCOUNTER — Ambulatory Visit
Admission: RE | Admit: 2013-02-28 | Discharge: 2013-02-28 | Disposition: A | Discharge status: Home / Self Care | Payer: Medicare Other | Source: Ambulatory Visit | Attending: Family Medicine | Admitting: Family Medicine

## 2013-02-28 DIAGNOSIS — R51 Headache: Secondary | ICD-10-CM | POA: Diagnosis not present

## 2013-02-28 DIAGNOSIS — R519 Headache, unspecified: Secondary | ICD-10-CM

## 2013-02-28 DIAGNOSIS — R42 Dizziness and giddiness: Secondary | ICD-10-CM | POA: Diagnosis not present

## 2013-03-04 ENCOUNTER — Encounter: Payer: Self-pay | Admitting: Diagnostic Neuroimaging

## 2013-03-04 ENCOUNTER — Ambulatory Visit (INDEPENDENT_AMBULATORY_CARE_PROVIDER_SITE_OTHER): Payer: Medicare Other | Admitting: Diagnostic Neuroimaging

## 2013-03-04 VITALS — BP 148/82 | HR 72 | Temp 98.3°F | Ht 65.5 in | Wt 179.0 lb

## 2013-03-04 DIAGNOSIS — G43009 Migraine without aura, not intractable, without status migrainosus: Secondary | ICD-10-CM | POA: Diagnosis not present

## 2013-03-04 DIAGNOSIS — G44209 Tension-type headache, unspecified, not intractable: Secondary | ICD-10-CM | POA: Diagnosis not present

## 2013-03-04 NOTE — Progress Notes (Addendum)
[facility]  PATIENT: Joanna Brown DOB: January 25, 1941  REFERRING CLINICIAN: Lodema Hong HISTORY FROM: patient REASON FOR VISIT: new consult   HISTORICAL  CHIEF COMPLAINT:  Chief Complaint  Patient presents with  . Headache    HISTORY OF PRESENT ILLNESS:   72 year old right-handed female with history of hypertension, diabetes, hypercholesterolemia, heart disease, here for evaluation of headaches since March 2014.  Patient reports new onset headaches in March 2014, with brief mild nagging headaches and dull pain. Headaches are slightly more prominent on the left side. She has some nausea and photophobia. Mild blurred vision with these headaches. No scintillating scotoma. Headaches can last 5-10 minutes at a time and occur 2-3 times per day. Sometimes she takes ibuprofen for these. Headaches are mild in severity.  Triggering factors may include her son passing away in Jan 2014, 2 months prior to onset of headaches. She did have some sleep and depression problems following this and saw psychiatry/psychology and now feels better. Patient also reports sleep problems. Patient may fall asleep for 3-4 hours, then wake up and is then unable to fall asleep again. Patient's husband reports that she snores intermittently. She feels tired when she wakes up, even if she sleeps 7 hours.  REVIEW OF SYSTEMS: Full 14 system review of systems performed and notable only for palpitation ringing in ears trouble swallowing itching moles constipation snoring cough blurred vision feeling hot joint swelling headache weakness dizziness not in a sleep increased appetite.  ALLERGIES: No Known Allergies  HOME MEDICATIONS: Outpatient Prescriptions Prior to Visit  Medication Sig Dispense Refill  . amLODipine (NORVASC) 10 MG tablet TAKE  ONE TABLET BY MOUTH EVERY MORNING  90 tablet  1  . aspirin 81 MG tablet Take 81 mg by mouth daily.      . benazepril (LOTENSIN) 40 MG tablet Take 1 tablet (40 mg  total) by mouth daily.  90 tablet  1  . cloNIDine (CATAPRES) 0.2 MG tablet One tablet at bedtime only  90 tablet  1  . ferrous sulfate 325 (65 FE) MG tablet Take 325 mg by mouth daily with breakfast.      . glipiZIDE (GLIPIZIDE XL) 5 MG 24 hr tablet TAKE 1 TABLET (5 MG TOTAL) BY MOUTH DAILY.  90 tablet  1  . KRILL OIL OMEGA-3 PO Take by mouth.      . metFORMIN (GLUCOPHAGE) 1000 MG tablet TAKE 1 TABLET (1,000 MG TOTAL) BY MOUTH 2 (TWO) TIMES DAILY WITH A MEAL.  60 tablet  3  . Multiple Vitamins-Minerals (CENTRUM) tablet Take 1 tablet by mouth daily.      . pantoprazole (PROTONIX) 20 MG tablet TAKE 1 TABLET (20 MG TOTAL) BY MOUTH DAILY.  90 tablet  1  . pravastatin (PRAVACHOL) 40 MG tablet Take 1 tablet (40 mg total) by mouth every evening.  30 tablet  11  . Travoprost, BAK Free, (TRAVATAN) 0.004 % SOLN ophthalmic solution Place 1 drop into both eyes at bedtime.       . triamterene-hydrochlorothiazide (MAXZIDE) 75-50 MG per tablet Take 1 tablet by mouth daily.  90 tablet  1  . B-D ULTRAFINE III SHORT PEN 31G X 8 MM MISC USE ONCE DAILY WITH LANTUS  100 each  1  . Insulin Pen Needle (B-D ULTRAFINE III SHORT PEN) 31G X 8 MM MISC For use with insulin pens  150 each  5  . Insulin Syringes, Disposable, U-100 0.3 ML MISC Inject 1 Syringe into the skin as needed.  100 each  0   No facility-administered medications prior to visit.    PAST MEDICAL HISTORY: Past Medical History  Diagnosis Date  . Diabetes mellitus   . GERD (gastroesophageal reflux disease)   . Hyperlipidemia   . Dysrhythmia   . Blood transfusion     1980  . Hypertension     eccho and stress 4/10 reports on chart, EKG ` LOV 9/12 on chart  . Female bladder prolapse   . Heart disease     PAST SURGICAL HISTORY: Past Surgical History  Procedure Laterality Date  . Abdominal hysterectomy    . Ovarian tumors removed bilateral    . Thyroidectomy    . Lymph nodes removed under right arm      right  . Cholecystectomy    .  Thyroidectomy    . Colonoscopy w/ polypectomy    . Eye surgery      bilateral cataract extraction with IOL  . Anterior and posterior repair  04/26/2011    Procedure: ANTERIOR (CYSTOCELE) AND POSTERIOR REPAIR (RECTOCELE);  Surgeon: Martina Sinner, MD;  Location: [facility];  Service: Urology;  Laterality: N/A;  . Vaginal prolapse repair  04/26/2011    Procedure: VAGINAL VAULT SUSPENSION;  Surgeon: Martina Sinner, MD;  Location: [facility];  Service: Urology;  Laterality: N/A;  with Graft  10x6    FAMILY HISTORY: Family History  Problem Relation Age of Onset  . Heart disease Mother     MI  . Cancer Mother   . Heart disease Father     MI  . Cancer Sister     liver  . Cancer Sister     bone    SOCIAL HISTORY:  History   Social History  . Marital Status: Married    Spouse Name: [name]    Number of Children: 1  . Years of Education: Trade   Occupational History  . Retired    Social History Main Topics  . Smoking status: Never Smoker   . Smokeless tobacco: Never Used  . Alcohol Use: No     Comment: socially- none x 30 years  . Drug Use: No  . Sexual Activity: Not on file   Other Topics Concern  . Not on file   Social History Narrative   Patient lives at home with spouse.   Caffeine Use: 1 cup of coffee daily     PHYSICAL EXAM  Filed Vitals:   03/04/13 0959  BP: 148/82  Pulse: 72  Temp: 98.3 F (36.8 C)  TempSrc: Oral  Height: 5' 5.5" (1.664 m)  Weight: 179 lb (81.194 kg)    Not recorded    Body mass index is 29.32 kg/(m^2).  GENERAL EXAM: Patient is in no distress; RIGHT NECK MASS PALPATED, SUPERIOR TO THYROID CARTILAGE ON THE RIGHT.  CARDIOVASCULAR: Regular rate and rhythm, SYSTOLIC MURMUR, no carotid bruits  NEUROLOGIC: MENTAL STATUS: awake, alert, language fluent, comprehension intact, naming intact CRANIAL NERVE: no papilledema on fundoscopic exam, pupils equal and reactive to light, visual fields full to confrontation, extraocular muscles  intact, no nystagmus, facial sensation and strength symmetric, uvula midline, shoulder shrug symmetric, tongue midline. MOTOR: normal bulk and tone, full strength in the BUE, BLE SENSORY: normal and symmetric to light touch, pinprick, temperature, vibration COORDINATION: finger-nose-finger, fine finger movements normal REFLEXES: deep tendon reflexes present and symmetric GAIT/STATION: narrow based gait; able to walk on toes, heels and tandem; romberg is negative   DIAGNOSTIC DATA (LABS, IMAGING, TESTING) - I reviewed patient records, labs, notes,  testing and imaging myself where available.  Lab Results  Component Value Date   WBC 7.3 06/11/2012   HGB 13.3 06/11/2012   HCT 38.5 06/11/2012   MCV 83.2 06/11/2012   PLT 213 06/11/2012      Component Value Date/Time   NA 136 02/11/2013 0816   K 3.7 02/11/2013 0816   CL 100 02/11/2013 0816   CO2 27 02/11/2013 0816   GLUCOSE 157* 02/11/2013 0816   BUN 22 02/11/2013 0816   CREATININE 1.11* 02/11/2013 0816   CREATININE 1.17* 05/02/2011 0454   CALCIUM 9.8 02/11/2013 0816   PROT 7.0 02/11/2013 0816   ALBUMIN 4.0 02/11/2013 0816   AST 19 02/11/2013 0816   ALT 24 02/11/2013 0816   ALKPHOS 39 02/11/2013 0816   BILITOT 0.3 02/11/2013 0816   GFRNONAA 46* 05/02/2011 0454   GFRAA 54* 05/02/2011 0454   Lab Results  Component Value Date   CHOL 224* 02/11/2013   HDL 32* 02/11/2013   LDLCALC 132* 02/11/2013   TRIG 301* 02/11/2013   CHOLHDL 7.0 02/11/2013   Lab Results  Component Value Date   HGBA1C 7.7* 02/19/2013   No results found for this basename: [id number]   Lab Results  Component Value Date   TSH 4.183 06/07/2011    02/28/13 MRI BRAIN - mild periventricular gliosis   ASSESSMENT AND PLAN  72 y.o. year old female here with new onset headaches in March 2014, with tension and migraine features. Patient has prior history of headaches in teenage years, which sound like migraine headaches. Recent triggering factors include her son passing away 2 months  prior to onset of headaches. Patient also has poor sleep, snoring and daytime fatigue. MRI brain and neurologic examination are unremarkable. Headaches are mild in severity.  Ddx: tension headache, migraine headache, stress, sleep problems  PLAN: - observation - discuss stress, insomnia with PCP and psychiatry - consider sleep study to screen for sleep apnea (snoring and daytime fatigue)  Return in about 6 months (around 09/02/2013) for with Edison Nasuti, MD 03/04/2013, 10:47 AM Certified in Neurology, Neurophysiology and Neuroimaging  [facility] [address], [address] [place], [place] [address] [phone]

## 2013-03-04 NOTE — Patient Instructions (Signed)
Try to start mild physical activity / exercise program; 3-5 x per week for 20-30 minutes per day.  Monitor stress, sleep and caffeine intake.  Consider sleep study testing.  Follow up with PCP about right sided neck mass / lump.

## 2013-03-12 DIAGNOSIS — E119 Type 2 diabetes mellitus without complications: Secondary | ICD-10-CM | POA: Diagnosis not present

## 2013-03-12 DIAGNOSIS — H4011X0 Primary open-angle glaucoma, stage unspecified: Secondary | ICD-10-CM | POA: Diagnosis not present

## 2013-03-12 DIAGNOSIS — H409 Unspecified glaucoma: Secondary | ICD-10-CM | POA: Diagnosis not present

## 2013-04-04 DIAGNOSIS — E119 Type 2 diabetes mellitus without complications: Secondary | ICD-10-CM | POA: Diagnosis not present

## 2013-04-04 DIAGNOSIS — L989 Disorder of the skin and subcutaneous tissue, unspecified: Secondary | ICD-10-CM | POA: Diagnosis not present

## 2013-04-04 DIAGNOSIS — L909 Atrophic disorder of skin, unspecified: Secondary | ICD-10-CM | POA: Diagnosis not present

## 2013-04-06 ENCOUNTER — Other Ambulatory Visit: Payer: Self-pay | Admitting: Family Medicine

## 2013-04-21 ENCOUNTER — Other Ambulatory Visit: Payer: Self-pay | Admitting: Family Medicine

## 2013-05-24 DIAGNOSIS — E119 Type 2 diabetes mellitus without complications: Secondary | ICD-10-CM | POA: Diagnosis not present

## 2013-05-24 DIAGNOSIS — R5381 Other malaise: Secondary | ICD-10-CM | POA: Diagnosis not present

## 2013-05-24 DIAGNOSIS — E785 Hyperlipidemia, unspecified: Secondary | ICD-10-CM | POA: Diagnosis not present

## 2013-05-24 LAB — LIPID PANEL
Cholesterol: 243 mg/dL — ABNORMAL HIGH (ref 0–200)
HDL: 34 mg/dL — ABNORMAL LOW (ref >39)
LDL Cholesterol: 155 mg/dL — ABNORMAL HIGH (ref 0–99)
Total CHOL/HDL Ratio: 7.1 Ratio
Triglycerides: 268 mg/dL — ABNORMAL HIGH (ref <150)
VLDL: 54 mg/dL — ABNORMAL HIGH (ref 0–40)

## 2013-05-24 LAB — COMPLETE METABOLIC PANEL WITH GFR
ALT: 37 U/L — ABNORMAL HIGH (ref 0–35)
AST: 32 U/L (ref 0–37)
Albumin: 4.1 g/dL (ref 3.5–5.2)
Alkaline Phosphatase: 42 U/L (ref 39–117)
BUN: 14 mg/dL (ref 6–23)
CO2: 31 mEq/L (ref 19–32)
Calcium: 9.7 mg/dL (ref 8.4–10.5)
Chloride: 99 mEq/L (ref 96–112)
Creat: 0.98 mg/dL (ref 0.50–1.10)
GFR, Est African American: 67 mL/min
GFR, Est Non African American: 58 mL/min — ABNORMAL LOW
Glucose, Bld: 154 mg/dL — ABNORMAL HIGH (ref 70–99)
Potassium: 4.3 mEq/L (ref 3.5–5.3)
Sodium: 139 mEq/L (ref 135–145)
Total Bilirubin: 0.5 mg/dL (ref 0.3–1.2)
Total Protein: 7.2 g/dL (ref 6.0–8.3)

## 2013-05-24 LAB — HEMOGLOBIN A1C
Hgb A1c MFr Bld: 8.3 % — ABNORMAL HIGH (ref <5.7)
Mean Plasma Glucose: 192 mg/dL — ABNORMAL HIGH (ref <117)

## 2013-05-25 LAB — TSH: TSH: 3.661 u[IU]/mL (ref 0.350–4.500)

## 2013-05-29 ENCOUNTER — Encounter (INDEPENDENT_AMBULATORY_CARE_PROVIDER_SITE_OTHER): Payer: Self-pay

## 2013-05-29 ENCOUNTER — Ambulatory Visit (INDEPENDENT_AMBULATORY_CARE_PROVIDER_SITE_OTHER): Payer: Medicare Other | Admitting: Family Medicine

## 2013-05-29 ENCOUNTER — Encounter: Payer: Self-pay | Admitting: Family Medicine

## 2013-05-29 VITALS — BP 124/80 | HR 98 | Resp 16 | Ht 65.0 in | Wt 175.8 lb

## 2013-05-29 DIAGNOSIS — E663 Overweight: Secondary | ICD-10-CM

## 2013-05-29 DIAGNOSIS — I10 Essential (primary) hypertension: Secondary | ICD-10-CM

## 2013-05-29 DIAGNOSIS — E1129 Type 2 diabetes mellitus with other diabetic kidney complication: Secondary | ICD-10-CM

## 2013-05-29 DIAGNOSIS — R5383 Other fatigue: Secondary | ICD-10-CM

## 2013-05-29 DIAGNOSIS — R5381 Other malaise: Secondary | ICD-10-CM

## 2013-05-29 DIAGNOSIS — E785 Hyperlipidemia, unspecified: Secondary | ICD-10-CM | POA: Diagnosis not present

## 2013-05-29 DIAGNOSIS — E119 Type 2 diabetes mellitus without complications: Secondary | ICD-10-CM

## 2013-05-29 DIAGNOSIS — N189 Chronic kidney disease, unspecified: Secondary | ICD-10-CM

## 2013-05-29 DIAGNOSIS — E1122 Type 2 diabetes mellitus with diabetic chronic kidney disease: Secondary | ICD-10-CM

## 2013-05-29 MED ORDER — AMLODIPINE BESYLATE 10 MG PO TABS: ORAL_TABLET | ORAL | Status: DC

## 2013-05-29 MED ORDER — BENAZEPRIL HCL 40 MG PO TABS: 40.0000 mg | ORAL_TABLET | Freq: Every day | ORAL | Status: DC

## 2013-05-29 MED ORDER — GLIPIZIDE ER 5 MG PO TB24: ORAL_TABLET | ORAL | Status: DC

## 2013-05-29 MED ORDER — CLONIDINE HCL 0.2 MG PO TABS: ORAL_TABLET | ORAL | Status: DC

## 2013-05-29 MED ORDER — TRIAMTERENE-HCTZ 75-50 MG PO TABS
1.0000 | ORAL_TABLET | Freq: Every day | ORAL | Status: DC
Start: 2013-05-29 — End: 2013-11-11

## 2013-05-29 MED ORDER — PRAVASTATIN SODIUM 40 MG PO TABS: 40.0000 mg | ORAL_TABLET | Freq: Every day | ORAL | Status: DC

## 2013-05-29 NOTE — Patient Instructions (Addendum)
F/u last week in April, pls get fasting labs 3 to 5 days before visit  New is pravachol at bedtime for cholesterol  Start taking the clonidine later at bedtime in the hope that you have less nausea  BP is excellent  Pls reduce carbs and fats, and continue daily walking  Fasting lipid, cmp and EGFr, and HBa1C in end April before f/u visit

## 2013-06-02 NOTE — Assessment & Plan Note (Addendum)
Unchanged. Patient re-educated about  the importance of commitment to a  minimum of 150 minutes of exercise per week. The importance of healthy food choices with portion control discussed. Encouraged to start a food diary, count calories and to consider  joining a support group. Sample diet sheets offered. Goals set by the patient for the next several months.    

## 2013-06-02 NOTE — Progress Notes (Signed)
   Subjective:    Patient ID: Joanna Brown, female    DOB: Sep 24, 1940, 73 y.o.   MRN: [id number]  HPI The PT is here for follow up and re-evaluation of chronic medical conditions, medication management and review of any available recent lab and radiology data.  Preventive health is updated, specifically  Cancer screening and Immunization.   Questions or concerns regarding consultations or procedures which the PT has had in the interim are  addressed. The PT c/o nausea with clonidine, she will attempt to take it at least 2.5 hours after supper , this should help.Has not been taking the pravachol, just thought she could "do without it"  States sue to dietary indiscretion over the holidays , her blood sugars have been higher than they should be, and she intends to reverse this , denies polyuria, polydipsia or blurred vision      Review of Systems See HPI Denies recent fever or chills. Denies sinus pressure, nasal congestion, ear pain or sore throat. Denies chest congestion, productive cough or wheezing. Denies chest pains, palpitations and leg swelling Denies abdominal pain, nausea, vomiting,diarrhea or constipation.   Denies dysuria, frequency, hesitancy or incontinence. Denies joint pain, swelling and limitation in mobility. Denies headaches, seizures, numbness, or tingling. Denies depression, anxiety or insomnia. Denies skin break down or rash.        Objective:   Physical Exam Patient alert and oriented and in no cardiopulmonary distress.  HEENT: No facial asymmetry, EOMI, no sinus tenderness,  oropharynx pink and moist.  Neck supple no adenopathy.  Chest: Clear to auscultation bilaterally.  CVS: S1, S2 no murmurs, no S3.  ABD: Soft non tender. Bowel sounds normal.  Ext: No edema  MS: Adequate ROM spine, shoulders, hips and knees.  Skin: Intact, no ulcerations or rash noted.  Psych: Good eye contact, normal affect. Memory intact not anxious or depressed  appearing.  CNS: CN 2-12 intact, power, tone and sensation normal throughout.        Assessment & Plan:

## 2013-06-02 NOTE — Assessment & Plan Note (Signed)
Deteriorated, pt states due to dietary indiscretion which she intends to address Patient advised to reduce carb and sweets, commit to regular physical activity, take meds as prescribed, test blood as directed, and attempt to lose weight, to improve blood sugar control.

## 2013-06-02 NOTE — Assessment & Plan Note (Signed)
Controlled, no change in medication DASH diet and commitment to daily physical activity for a minimum of 30 minutes discussed and encouraged, as a part of hypertension management. The importance of attaining a healthy weight is also discussed.  

## 2013-06-02 NOTE — Assessment & Plan Note (Signed)
Deteriorated due to non compliance with med and dietary indiscretion , re educate re the need for, and benefit of taking statin prescribed Updated lab next visit

## 2013-06-25 DIAGNOSIS — H409 Unspecified glaucoma: Secondary | ICD-10-CM | POA: Diagnosis not present

## 2013-06-25 DIAGNOSIS — H4011X0 Primary open-angle glaucoma, stage unspecified: Secondary | ICD-10-CM | POA: Diagnosis not present

## 2013-07-05 DIAGNOSIS — E1149 Type 2 diabetes mellitus with other diabetic neurological complication: Secondary | ICD-10-CM | POA: Diagnosis not present

## 2013-07-05 DIAGNOSIS — L851 Acquired keratosis [keratoderma] palmaris et plantaris: Secondary | ICD-10-CM | POA: Diagnosis not present

## 2013-07-05 DIAGNOSIS — B351 Tinea unguium: Secondary | ICD-10-CM | POA: Diagnosis not present

## 2013-08-04 ENCOUNTER — Other Ambulatory Visit: Payer: Self-pay | Admitting: Family Medicine

## 2013-08-06 ENCOUNTER — Ambulatory Visit: Payer: Self-pay | Admitting: Internal Medicine

## 2013-08-09 DIAGNOSIS — Z1231 Encounter for screening mammogram for malignant neoplasm of breast: Secondary | ICD-10-CM | POA: Diagnosis not present

## 2013-08-12 ENCOUNTER — Emergency Department (HOSPITAL_COMMUNITY)
Admission: EM | Admit: 2013-08-12 | Discharge: 2013-08-12 | Disposition: A | Discharge status: Home / Self Care | Payer: Medicare Other | Attending: Emergency Medicine | Admitting: Emergency Medicine

## 2013-08-12 ENCOUNTER — Emergency Department (HOSPITAL_COMMUNITY): Payer: Medicare Other

## 2013-08-12 ENCOUNTER — Encounter (HOSPITAL_COMMUNITY): Payer: Self-pay | Admitting: Emergency Medicine

## 2013-08-12 DIAGNOSIS — R519 Headache, unspecified: Secondary | ICD-10-CM

## 2013-08-12 DIAGNOSIS — M542 Cervicalgia: Secondary | ICD-10-CM | POA: Insufficient documentation

## 2013-08-12 DIAGNOSIS — Z8742 Personal history of other diseases of the female genital tract: Secondary | ICD-10-CM | POA: Insufficient documentation

## 2013-08-12 DIAGNOSIS — R002 Palpitations: Secondary | ICD-10-CM | POA: Diagnosis not present

## 2013-08-12 DIAGNOSIS — K117 Disturbances of salivary secretion: Secondary | ICD-10-CM | POA: Diagnosis not present

## 2013-08-12 DIAGNOSIS — E785 Hyperlipidemia, unspecified: Secondary | ICD-10-CM | POA: Insufficient documentation

## 2013-08-12 DIAGNOSIS — E119 Type 2 diabetes mellitus without complications: Secondary | ICD-10-CM | POA: Insufficient documentation

## 2013-08-12 DIAGNOSIS — Z7982 Long term (current) use of aspirin: Secondary | ICD-10-CM | POA: Insufficient documentation

## 2013-08-12 DIAGNOSIS — Z79899 Other long term (current) drug therapy: Secondary | ICD-10-CM | POA: Diagnosis not present

## 2013-08-12 DIAGNOSIS — R51 Headache: Secondary | ICD-10-CM | POA: Insufficient documentation

## 2013-08-12 DIAGNOSIS — I10 Essential (primary) hypertension: Secondary | ICD-10-CM | POA: Diagnosis not present

## 2013-08-12 DIAGNOSIS — Z8719 Personal history of other diseases of the digestive system: Secondary | ICD-10-CM | POA: Diagnosis not present

## 2013-08-12 DIAGNOSIS — R42 Dizziness and giddiness: Secondary | ICD-10-CM | POA: Diagnosis not present

## 2013-08-12 DIAGNOSIS — E860 Dehydration: Secondary | ICD-10-CM | POA: Diagnosis not present

## 2013-08-12 DIAGNOSIS — R197 Diarrhea, unspecified: Secondary | ICD-10-CM | POA: Insufficient documentation

## 2013-08-12 DIAGNOSIS — R0602 Shortness of breath: Secondary | ICD-10-CM | POA: Diagnosis not present

## 2013-08-12 DIAGNOSIS — R05 Cough: Secondary | ICD-10-CM | POA: Diagnosis not present

## 2013-08-12 DIAGNOSIS — R059 Cough, unspecified: Secondary | ICD-10-CM | POA: Diagnosis not present

## 2013-08-12 LAB — URINALYSIS, ROUTINE W REFLEX MICROSCOPIC
Bilirubin Urine: NEGATIVE
Glucose, UA: NEGATIVE mg/dL
Hgb urine dipstick: NEGATIVE
Ketones, ur: NEGATIVE mg/dL
Leukocytes, UA: NEGATIVE
Nitrite: NEGATIVE
Protein, ur: NEGATIVE mg/dL
Specific Gravity, Urine: 1.004 — ABNORMAL LOW (ref 1.005–1.030)
Urobilinogen, UA: 0.2 mg/dL (ref 0.0–1.0)
pH: 6.5 (ref 5.0–8.0)

## 2013-08-12 LAB — COMPREHENSIVE METABOLIC PANEL
ALT: 41 U/L — ABNORMAL HIGH (ref 0–35)
AST: 43 U/L — ABNORMAL HIGH (ref 0–37)
Albumin: 4 g/dL (ref 3.5–5.2)
Alkaline Phosphatase: 46 U/L (ref 39–117)
BUN: 16 mg/dL (ref 6–23)
CO2: 27 mEq/L (ref 19–32)
Calcium: 9.9 mg/dL (ref 8.4–10.5)
Chloride: 93 mEq/L — ABNORMAL LOW (ref 96–112)
Creatinine, Ser: 0.92 mg/dL (ref 0.50–1.10)
GFR calc Af Amer: 71 mL/min — ABNORMAL LOW (ref >90)
GFR calc non Af Amer: 61 mL/min — ABNORMAL LOW (ref >90)
Glucose, Bld: 140 mg/dL — ABNORMAL HIGH (ref 70–99)
Potassium: 3.8 mEq/L (ref 3.7–5.3)
Sodium: 132 mEq/L — ABNORMAL LOW (ref 137–147)
Total Bilirubin: 0.3 mg/dL (ref 0.3–1.2)
Total Protein: 7.7 g/dL (ref 6.0–8.3)

## 2013-08-12 LAB — CBC WITH DIFFERENTIAL/PLATELET
Basophils Absolute: 0 10*3/uL (ref 0.0–0.1)
Basophils Relative: 1 % (ref 0–1)
Eosinophils Absolute: 0.3 10*3/uL (ref 0.0–0.7)
Eosinophils Relative: 4 % (ref 0–5)
HCT: 35.9 % — ABNORMAL LOW (ref 36.0–46.0)
Hemoglobin: 12.5 g/dL (ref 12.0–15.0)
Lymphocytes Relative: 46 % (ref 12–46)
Lymphs Abs: 3.7 10*3/uL (ref 0.7–4.0)
MCH: 29.1 pg (ref 26.0–34.0)
MCHC: 34.8 g/dL (ref 30.0–36.0)
MCV: 83.7 fL (ref 78.0–100.0)
Monocytes Absolute: 0.5 10*3/uL (ref 0.1–1.0)
Monocytes Relative: 6 % (ref 3–12)
Neutro Abs: 3.5 10*3/uL (ref 1.7–7.7)
Neutrophils Relative %: 44 % (ref 43–77)
Platelets: 215 10*3/uL (ref 150–400)
RBC: 4.29 MIL/uL (ref 3.87–5.11)
RDW: 13.4 % (ref 11.5–15.5)
WBC: 8.1 10*3/uL (ref 4.0–10.5)

## 2013-08-12 LAB — TROPONIN I: Troponin I: <0.3 ng/mL (ref <0.30)

## 2013-08-12 MED ORDER — SODIUM CHLORIDE 0.9 % IV BOLUS (SEPSIS)
1000.0000 mL | Freq: Once | INTRAVENOUS | Status: AC
  Administered 2013-08-12: 1000 mL via INTRAVENOUS

## 2013-08-12 MED ORDER — KETOROLAC TROMETHAMINE 30 MG/ML IJ SOLN
30.0000 mg | Freq: Once | INTRAMUSCULAR | Status: AC
  Administered 2013-08-12: 30 mg via INTRAVENOUS
  Filled 2013-08-12: qty 1

## 2013-08-12 MED ORDER — ONDANSETRON HCL 4 MG/2ML IJ SOLN
4.0000 mg | Freq: Once | INTRAMUSCULAR | Status: AC
  Administered 2013-08-12: 4 mg via INTRAVENOUS
  Filled 2013-08-12: qty 2

## 2013-08-12 NOTE — ED Provider Notes (Signed)
TIME SEEN: 3:05 PM  CHIEF COMPLAINT: lightheadedness, HA, dry mouth, palpitations with standing  HPI: Pt is a 73 y.o. F with h/o DM, HLD, HTN who presents to the ED with lightheadedness without vertigo, posterior throbbing HA and neck pain that was gradual in onset, palpitations with standing.  No CP or SOB.  No fever.  Pt has had a cough with yellow sputum production.  No vomiting.  She had 2 episodes of diarrhea this AM.  Pt denies bloody stool or melena.  No numbness, tingling, focal weakness.  She feels weak all over.  No syncope.  No thunderclap HA.  No sudden onset, worst HA of her life.  She has had similar HA in past.  No head injury.  She is not on anticoagulation.  She did not take any medication for this HA.  She states she had similar symptoms once with being dehydrated.  ROS: See HPI Constitutional: no fever  Eyes: no drainage  ENT: no runny nose   Cardiovascular:  no chest pain  Resp: no SOB  GI: no vomiting GU: no dysuria Integumentary: no rash  Allergy: no hives  Musculoskeletal: no leg swelling  Neurological: no slurred speech ROS otherwise negative  PAST MEDICAL HISTORY/PAST SURGICAL HISTORY:  Past Medical History  Diagnosis Date  . Diabetes mellitus   . GERD (gastroesophageal reflux disease)   . Hyperlipidemia   . Dysrhythmia   . Blood transfusion     1980  . Hypertension     eccho and stress 4/10 reports on chart, EKG ` LOV 9/12 on chart  . Female bladder prolapse   . Heart disease     MEDICATIONS:  Prior to Admission medications   Medication Sig Start Date End Date Taking? Authorizing Provider  amLODipine (NORVASC) 10 MG tablet Take 10 mg by mouth daily. 05/29/13  Yes Fayrene Helper, MD  aspirin 81 MG tablet Take 81 mg by mouth daily.   Yes Historical Provider, MD  benazepril (LOTENSIN) 40 MG tablet Take 1 tablet (40 mg total) by mouth daily. 05/29/13  Yes Fayrene Helper, MD  cloNIDine (CATAPRES) 0.2 MG tablet Take 0.2 mg by mouth at bedtime.  05/29/13 05/29/14 Yes Fayrene Helper, MD  ferrous sulfate 325 (65 FE) MG tablet Take 325 mg by mouth daily with breakfast.   Yes Historical Provider, MD  glipiZIDE (GLUCOTROL XL) 5 MG 24 hr tablet Take 5 mg by mouth daily with breakfast. 05/29/13  Yes Fayrene Helper, MD  KRILL OIL OMEGA-3 PO Take 1 capsule by mouth daily.    Yes Historical Provider, MD  metFORMIN (GLUCOPHAGE) 1000 MG tablet Take 1,000 mg by mouth 2 (two) times daily with a meal.   Yes Historical Provider, MD  Multiple Vitamins-Minerals (CENTRUM) tablet Take 1 tablet by mouth daily.   Yes Historical Provider, MD  pravastatin (PRAVACHOL) 40 MG tablet Take 1 tablet (40 mg total) by mouth daily. 05/29/13 05/29/14 Yes Fayrene Helper, MD  Travoprost, BAK Free, (TRAVATAN) 0.004 % SOLN ophthalmic solution Place 1 drop into both eyes at bedtime.    Yes Historical Provider, MD  triamterene-hydrochlorothiazide (MAXZIDE) 75-50 MG per tablet Take 1 tablet by mouth daily. 05/29/13 05/29/14 Yes Fayrene Helper, MD    ALLERGIES:  No Known Allergies  SOCIAL HISTORY:  History  Substance Use Topics  . Smoking status: Never Smoker   . Smokeless tobacco: Never Used  . Alcohol Use: No     Comment: socially- none x 30 years  FAMILY HISTORY: Family History  Problem Relation Age of Onset  . Heart disease Mother     MI  . Cancer Mother   . Heart disease Father     MI  . Cancer Sister     liver  . Cancer Sister     bone    EXAM: BP 130/75  Pulse 60  Temp(Src) 97.5 F (36.4 C)  Resp 20  SpO2 99% CONSTITUTIONAL: Alert and oriented and responds appropriately to questions. Well-appearing; well-nourished; pleasant, in NAD; appears younger than stated age HEAD: Normocephalic EYES: Conjunctivae clear, PERRL ENT: normal nose; no rhinorrhea; dry mucous membranes; pharynx without lesions noted NECK: Supple, no meningismus, no LAD  CARD: RRR; S1 and S2 appreciated; no murmurs, no clicks, no rubs, no gallops RESP: Normal chest  excursion without splinting or tachypnea; breath sounds clear and equal bilaterally; no wheezes, no rhonchi, no rales,  ABD/GI: Normal bowel sounds; non-distended; soft, non-tender, no rebound, no guarding BACK:  The back appears normal and is non-tender to palpation, there is no CVA tenderness EXT: Normal ROM in all joints; non-tender to palpation; no edema; normal capillary refill; no cyanosis    SKIN: Normal color for age and race; warm NEURO: Moves all extremities equally; strength 5/5 in all 4 extremities; sensation to light touch intact diffusely, CN 2-12 intact; no dysmetria to finger to nose testing bilaterally; normal RAM and heel to shin testing bilaterally PSYCH: The patient's mood and manner are appropriate. Grooming and personal hygiene are appropriate.  MEDICAL DECISION MAKING: Pt here with non-specific symptoms.  Suspect dehydration.  Less likely ACS, infection.  No concern for ICH, infarct as she reports her HA is almost gone without intervention and there are no neuro deficits.  Will obtain labs, urine, CXR (given several days of cough), give IVF, toradol and reassess.  ED PROGRESS: Patient's labs are unremarkable. Her troponin is negative. Given her symptoms have been going on since 9:00 this morning, do not feel she needs a second set of enzymes. Her EKG shows no new ischemic changes. Chest x-ray clear. Urine shows no sign of infection.   5:00 PM  Pt reports her headache is completely gone and her dizziness is also resolved. She states she is ready to be discharged home. Have given strict return precautions and supportive care instructions. Patient and husband bedside verbalize understanding and are comfortable with plan.    EKG Interpretation  Date/Time:  Monday August 12 2013 12:55:44 EDT Ventricular Rate:  63 PR Interval:  206 QRS Duration: 96 QT Interval:  423 QTC Calculation: 433 R Axis:   -28 Text Interpretation:  Sinus rhythm Borderline left axis deviation Abnormal  R-wave progression, early transition Confirmed by Ramy Greth,  DO, Azzan Butler ([id number]) on 08/12/2013 3:02:21 PM          [place], DO 08/12/13 1718

## 2013-08-12 NOTE — Discharge Instructions (Signed)
Dehydration, Adult Dehydration is when you lose more fluids from the body than you take in. Vital organs like the kidneys, brain, and heart cannot function without a proper amount of fluids and salt. Any loss of fluids from the body can cause dehydration.  CAUSES   Vomiting.  Diarrhea.  Excessive sweating.  Excessive urine output.  Fever. SYMPTOMS  Mild dehydration  Thirst.  Dry lips.  Slightly dry mouth. Moderate dehydration  Very dry mouth.  Sunken eyes.  Skin does not bounce back quickly when lightly pinched and released.  Dark urine and decreased urine production.  Decreased tear production.  Headache. Severe dehydration  Very dry mouth.  Extreme thirst.  Rapid, weak pulse (more than 100 beats per minute at rest).  Cold hands and feet.  Not able to sweat in spite of heat and temperature.  Rapid breathing.  Blue lips.  Confusion and lethargy.  Difficulty being awakened.  Minimal urine production.  No tears. DIAGNOSIS  Your caregiver will diagnose dehydration based on your symptoms and your exam. Blood and urine tests will help confirm the diagnosis. The diagnostic evaluation should also identify the cause of dehydration. TREATMENT  Treatment of mild or moderate dehydration can often be done at home by increasing the amount of fluids that you drink. It is best to drink small amounts of fluid more often. Drinking too much at one time can make vomiting worse. Refer to the home care instructions below. Severe dehydration needs to be treated at the hospital where you will probably be given intravenous (IV) fluids that contain water and electrolytes. HOME CARE INSTRUCTIONS   Ask your caregiver about specific rehydration instructions.  Drink enough fluids to keep your urine clear or pale yellow.  Drink small amounts frequently if you have nausea and vomiting.  Eat as you normally do.  Avoid:  Foods or drinks high in sugar.  Carbonated  drinks.  Juice.  Extremely hot or cold fluids.  Drinks with caffeine.  Fatty, greasy foods.  Alcohol.  Tobacco.  Overeating.  Gelatin desserts.  Wash your hands well to avoid spreading bacteria and viruses.  Only take over-the-counter or prescription medicines for pain, discomfort, or fever as directed by your caregiver.  Ask your caregiver if you should continue all prescribed and over-the-counter medicines.  Keep all follow-up appointments with your caregiver. SEEK MEDICAL CARE IF:  You have abdominal pain and it increases or stays in one area (localizes).  You have a rash, stiff neck, or severe headache.  You are irritable, sleepy, or difficult to awaken.  You are weak, dizzy, or extremely thirsty. SEEK IMMEDIATE MEDICAL CARE IF:   You are unable to keep fluids down or you get worse despite treatment.  You have frequent episodes of vomiting or diarrhea.  You have blood or green matter (bile) in your vomit.  You have blood in your stool or your stool looks black and tarry.  You have not urinated in 6 to 8 hours, or you have only urinated a small amount of very dark urine.  You have a fever.  You faint. MAKE SURE YOU:   Understand these instructions.  Will watch your condition.  Will get help right away if you are not doing well or get worse. Document Released: 05/02/2005 Document Revised: 07/25/2011 Document Reviewed: 12/20/2010 [facility] Patient Information 2014 [place], [place].  Migraine Headache A migraine headache is an intense, throbbing pain on one or both sides of your head. A migraine can last for 30 minutes to several  hours. CAUSES  The exact cause of a migraine headache is not always known. However, a migraine may be caused when nerves in the brain become irritated and release chemicals that cause inflammation. This causes pain. Certain things may also trigger migraines, such as:  Alcohol.  Smoking.  Stress.  Menstruation.  Aged  cheeses.  Foods or drinks that contain nitrates, glutamate, aspartame, or tyramine.  Lack of sleep.  Chocolate.  Caffeine.  Hunger.  Physical exertion.  Fatigue.  Medicines used to treat chest pain (nitroglycerine), birth control pills, estrogen, and some blood pressure medicines. SIGNS AND SYMPTOMS  Pain on one or both sides of your head.  Pulsating or throbbing pain.  Severe pain that prevents daily activities.  Pain that is aggravated by any physical activity.  Nausea, vomiting, or both.  Dizziness.  Pain with exposure to bright lights, loud noises, or activity.  General sensitivity to bright lights, loud noises, or smells. Before you get a migraine, you may get warning signs that a migraine is coming (aura). An aura may include:  Seeing flashing lights.  Seeing bright spots, halos, or zig-zag lines.  Having tunnel vision or blurred vision.  Having feelings of numbness or tingling.  Having trouble talking.  Having muscle weakness. DIAGNOSIS  A migraine headache is often diagnosed based on:  Symptoms.  Physical exam.  A CT scan or MRI of your head. These imaging tests cannot diagnose migraines, but they can help rule out other causes of headaches. TREATMENT Medicines may be given for pain and nausea. Medicines can also be given to help prevent recurrent migraines.  HOME CARE INSTRUCTIONS  Only take over-the-counter or prescription medicines for pain or discomfort as directed by your health care provider. The use of long-term narcotics is not recommended.  Lie down in a dark, quiet room when you have a migraine.  Keep a journal to find out what may trigger your migraine headaches. For example, write down:  What you eat and drink.  How much sleep you get.  Any change to your diet or medicines.  Limit alcohol consumption.  Quit smoking if you smoke.  Get 7 9 hours of sleep, or as recommended by your health care provider.  Limit  stress.  Keep lights dim if bright lights bother you and make your migraines worse. SEEK IMMEDIATE MEDICAL CARE IF:   Your migraine becomes severe.  You have a fever.  You have a stiff neck.  You have vision loss.  You have muscular weakness or loss of muscle control.  You start losing your balance or have trouble walking.  You feel faint or pass out.  You have severe symptoms that are different from your first symptoms. MAKE SURE YOU:   Understand these instructions.  Will watch your condition.  Will get help right away if you are not doing well or get worse. Document Released: 05/02/2005 Document Revised: 02/20/2013 Document Reviewed: 01/07/2013 [facility] Patient Information 2014 [place].

## 2013-08-12 NOTE — ED Notes (Signed)
Pt states woke up this morning feeling dizzy; c/o headache since last night; c/o pain at base of skull and back of neck; feels weak

## 2013-08-15 ENCOUNTER — Encounter: Payer: Self-pay | Admitting: *Deleted

## 2013-08-23 ENCOUNTER — Encounter: Payer: Self-pay | Admitting: Internal Medicine

## 2013-08-23 ENCOUNTER — Ambulatory Visit (INDEPENDENT_AMBULATORY_CARE_PROVIDER_SITE_OTHER): Payer: Medicare Other | Admitting: Internal Medicine

## 2013-08-23 VITALS — BP 122/80 | HR 74 | Ht 65.0 in | Wt 177.6 lb

## 2013-08-23 DIAGNOSIS — E785 Hyperlipidemia, unspecified: Secondary | ICD-10-CM

## 2013-08-23 DIAGNOSIS — K7689 Other specified diseases of liver: Secondary | ICD-10-CM

## 2013-08-23 DIAGNOSIS — K760 Fatty (change of) liver, not elsewhere classified: Secondary | ICD-10-CM

## 2013-08-23 DIAGNOSIS — I359 Nonrheumatic aortic valve disorder, unspecified: Secondary | ICD-10-CM

## 2013-08-23 DIAGNOSIS — I10 Essential (primary) hypertension: Secondary | ICD-10-CM | POA: Diagnosis not present

## 2013-08-23 DIAGNOSIS — I350 Nonrheumatic aortic (valve) stenosis: Secondary | ICD-10-CM | POA: Insufficient documentation

## 2013-08-23 MED ORDER — EZETIMIBE 10 MG PO TABS: 10.0000 mg | ORAL_TABLET | Freq: Every day | ORAL | Status: DC

## 2013-08-23 NOTE — Progress Notes (Signed)
OFFICE NOTE  Chief Complaint:  Occasional dizziness  Primary Care Physician: Joanna Nakayama, MD  HPI:  Joanna Brown is a 73 year old female who has a history of mild aortic stenosis with a valve area of approximately 1.7 cm, also a history of dyslipidemia and hypertension, both of which have been well controlled. We are seeing her back for an annual visit today. She reports actually feeling very well, started walking every day, has made major changes to her diet as reflected by a marked decrease in triglycerides. Unfortunately recently she had a mild elevation in liver enzymes slightly above normal on lovastatin. Typically we could tolerate liver enzymes up to 3 times normal on statin medications, however, she was taken off the lovastatin. Either way it is questionable whether she really needs the additional statin at this time and this certainly argues that she should have further workup as to why she had elevated liver enzymes, whether this is due to a new non-alcoholic steatohepatitis or perhaps concomitant medications causing her elevated liver enzymes.  In fact, I reviewed her CT scan today he years ago and she does have steatohepatitis.  Therefore she is not a good candidate for a statin medication. Her blood pressure seems to be well-controlled on her current regimen.  PMHx:  Past Medical History  Diagnosis Date  . Diabetes mellitus   . GERD (gastroesophageal reflux disease)   . Hyperlipidemia   . Dysrhythmia   . Blood transfusion     1980  . Hypertension     echo and stress 4/10 reports on chart, EKG ` LOV 9/12 on chart  . Female bladder prolapse   . Heart disease   . Mild aortic stenosis     Past Surgical History  Procedure Laterality Date  . Abdominal hysterectomy    . Ovary surgery      bilateral tumors removed  . Thyroidectomy    . Lymph node dissection Right     under arm  . Thyroidectomy  02/2008  . Colonoscopy w/ polypectomy    . Cataract extraction  Bilateral     with IOL  . Anterior and posterior repair  04/26/2011    Procedure: ANTERIOR (CYSTOCELE) AND POSTERIOR REPAIR (RECTOCELE);  Surgeon: Joanna Packer, MD;  Location: [facility];  Service: Urology;  Laterality: N/A;  . Vaginal prolapse repair  04/26/2011    Procedure: VAGINAL VAULT SUSPENSION;  Surgeon: Joanna Packer, MD;  Location: [facility];  Service: Urology;  Laterality: N/A;  with Graft  10x6  . Transthoracic echocardiogram  08/2011    EF=>55%, mild conc LVH; trace MR; mild TR; mild-mod AV calcification with mild valvular AV stenosis  . Nm myocar perf wall motion  2010    dipyridamole - mild-mod in intenstiy perfusion defect in mid anterior, mid anteroseptal wall, EF 70%  . Left heart cath  09/10/2008    normal coronary arteries, normal LV systolic function, EF 32% (Dr. Norlene Brown)    FAMHx:  Family History  Problem Relation Age of Onset  . Heart disease Mother     MI  . Stomach cancer Mother   . Heart disease Father     MI  . Liver cancer Sister   . Bone cancer Sister   . Stroke Maternal Grandfather   . Heart attack Paternal Grandfather   . Hypertension Sister   . Hypertension Sister   . Hypertension Child     SOCHx:   reports that she has never smoked. She has never  used smokeless tobacco. She reports that she does not drink alcohol or use illicit drugs.  ALLERGIES:  Allergies  Allergen Reactions  . Pravastatin     Patient states it made her crazy and forgetful    ROS: A comprehensive review of systems was negative except for: Neurological: positive for dizziness  HOME MEDS: Current Outpatient Prescriptions  Medication Sig Dispense Refill  . amLODipine (NORVASC) 10 MG tablet Take 10 mg by mouth daily.      Marland Kitchen aspirin 81 MG tablet Take 81 mg by mouth daily.      . benazepril (LOTENSIN) 40 MG tablet Take 1 tablet (40 mg total) by mouth daily.  90 tablet  1  . cloNIDine (CATAPRES) 0.2 MG tablet Take 0.2 mg by mouth at bedtime.      . ferrous sulfate  325 (65 FE) MG tablet Take 325 mg by mouth daily with breakfast.      . glipiZIDE (GLUCOTROL XL) 5 MG 24 hr tablet Take 5 mg by mouth daily with breakfast.      . KRILL OIL OMEGA-3 PO Take 1 capsule by mouth daily.       . metFORMIN (GLUCOPHAGE) 1000 MG tablet Take 1,000 mg by mouth 2 (two) times daily with a meal.      . Multiple Vitamins-Minerals (CENTRUM) tablet Take 1 tablet by mouth daily.      . pantoprazole (PROTONIX) 40 MG tablet Take 40 mg by mouth daily as needed.       . Travoprost, BAK Free, (TRAVATAN) 0.004 % SOLN ophthalmic solution Place 1 drop into both eyes at bedtime.       . triamterene-hydrochlorothiazide (MAXZIDE) 75-50 MG per tablet Take 1 tablet by mouth daily.  90 tablet  1  . ezetimibe (ZETIA) 10 MG tablet Take 1 tablet (10 mg total) by mouth daily.  35 tablet  0   No current facility-administered medications for this visit.    LABS/IMAGING: No results found for this or any previous visit (from the past 48 hour(s)). No results found.  VITALS: BP 122/80  Pulse 74  Ht 5\' 5"  (1.651 m)  Wt 177 lb 9.6 oz (80.559 kg)  BMI 29.55 kg/m2  EXAM: General appearance: alert and no distress Neck: no carotid bruit and no JVD Lungs: clear to auscultation bilaterally Heart: regular rate and rhythm, S1, S2 normal, no murmur, click, rub or gallop Abdomen: soft, non-tender; bowel sounds normal; no masses,  no organomegaly Extremities: extremities normal, atraumatic, no cyanosis or edema Pulses: 2+ and symmetric Skin: Skin color, texture, turgor normal. No rashes or lesions Neurologic: Grossly normal Psych: Mood, affect normal  EKG: Normal sinus rhythm at 74  ASSESSMENT: 1. Hypertension-controlled 2. Dyslipidemia 3. Mild aortic stenosis 4. NAFLD  PLAN: 1.   Joanna Brown is doing well with good control of her blood pressure on her current regimen. She does have dyslipidemia and has been intolerant to statins due to increasing liver enzymes. She does have nonalcoholic  fatty liver disease by CT scan. I would recommend a trial of Zetia to see if we can lower her cholesterol without significant changes in her liver function. I will plan to recheck a metabolic profile next week. She will be due for repeat echocardiogram next year to evaluate her very mild aortic stenosis.  [name], MD, [facility] Attending Cardiologist [place] 08/23/2013, 3:22 PM

## 2013-08-23 NOTE — Patient Instructions (Addendum)
Your physician has recommended you make the following change in your medication: START zetia 10mg  once daily.   Lab work (CMP) in 1 week  Lab work (fasting - lipid) in 6 months to re-check cholesterol.    Your physician wants you to follow-up in: 1 year. You will receive a reminder letter in the mail two months in advance. If you don't receive a letter, please call our office to schedule the follow-up appointment.  5 sample pack of Zetia (7 tablets each) - [id number] EXP-10/2015

## 2013-09-03 DIAGNOSIS — E785 Hyperlipidemia, unspecified: Secondary | ICD-10-CM | POA: Diagnosis not present

## 2013-09-03 DIAGNOSIS — E119 Type 2 diabetes mellitus without complications: Secondary | ICD-10-CM | POA: Diagnosis not present

## 2013-09-03 LAB — LIPID PANEL
Cholesterol: 217 mg/dL — ABNORMAL HIGH (ref 0–200)
HDL: 39 mg/dL — ABNORMAL LOW (ref >39)
LDL Cholesterol: 125 mg/dL — ABNORMAL HIGH (ref 0–99)
Total CHOL/HDL Ratio: 5.6 Ratio
Triglycerides: 265 mg/dL — ABNORMAL HIGH (ref <150)
VLDL: 53 mg/dL — ABNORMAL HIGH (ref 0–40)

## 2013-09-03 LAB — HEMOGLOBIN A1C
Hgb A1c MFr Bld: 8.1 % — ABNORMAL HIGH (ref <5.7)
Mean Plasma Glucose: 186 mg/dL — ABNORMAL HIGH (ref <117)

## 2013-09-03 LAB — COMPLETE METABOLIC PANEL WITH GFR
ALT: 33 U/L (ref 0–35)
AST: 32 U/L (ref 0–37)
Albumin: 4 g/dL (ref 3.5–5.2)
Alkaline Phosphatase: 36 U/L — ABNORMAL LOW (ref 39–117)
BUN: 14 mg/dL (ref 6–23)
CO2: 28 mEq/L (ref 19–32)
Calcium: 9.5 mg/dL (ref 8.4–10.5)
Chloride: 99 mEq/L (ref 96–112)
Creat: 0.99 mg/dL (ref 0.50–1.10)
GFR, Est African American: 66 mL/min
GFR, Est Non African American: 58 mL/min — ABNORMAL LOW
Glucose, Bld: 162 mg/dL — ABNORMAL HIGH (ref 70–99)
Potassium: 4.1 mEq/L (ref 3.5–5.3)
Sodium: 137 mEq/L (ref 135–145)
Total Bilirubin: 0.4 mg/dL (ref 0.2–1.2)
Total Protein: 7.1 g/dL (ref 6.0–8.3)

## 2013-09-04 ENCOUNTER — Encounter: Payer: Self-pay | Admitting: Family Medicine

## 2013-09-06 ENCOUNTER — Other Ambulatory Visit: Payer: Self-pay | Admitting: Family Medicine

## 2013-09-09 ENCOUNTER — Other Ambulatory Visit: Payer: Self-pay | Admitting: Family Medicine

## 2013-09-10 ENCOUNTER — Ambulatory Visit (INDEPENDENT_AMBULATORY_CARE_PROVIDER_SITE_OTHER): Payer: Medicare Other | Admitting: Family Medicine

## 2013-09-10 ENCOUNTER — Encounter: Payer: Self-pay | Admitting: Family Medicine

## 2013-09-10 VITALS — BP 130/78 | HR 95 | Resp 16 | Wt 177.0 lb

## 2013-09-10 DIAGNOSIS — N3000 Acute cystitis without hematuria: Secondary | ICD-10-CM

## 2013-09-10 DIAGNOSIS — I10 Essential (primary) hypertension: Secondary | ICD-10-CM

## 2013-09-10 DIAGNOSIS — E785 Hyperlipidemia, unspecified: Secondary | ICD-10-CM

## 2013-09-10 DIAGNOSIS — Z1211 Encounter for screening for malignant neoplasm of colon: Secondary | ICD-10-CM | POA: Diagnosis not present

## 2013-09-10 DIAGNOSIS — Z13820 Encounter for screening for osteoporosis: Secondary | ICD-10-CM | POA: Diagnosis not present

## 2013-09-10 DIAGNOSIS — E1129 Type 2 diabetes mellitus with other diabetic kidney complication: Secondary | ICD-10-CM | POA: Diagnosis not present

## 2013-09-10 DIAGNOSIS — N189 Chronic kidney disease, unspecified: Secondary | ICD-10-CM

## 2013-09-10 DIAGNOSIS — M949 Disorder of cartilage, unspecified: Secondary | ICD-10-CM

## 2013-09-10 DIAGNOSIS — E1122 Type 2 diabetes mellitus with diabetic chronic kidney disease: Secondary | ICD-10-CM

## 2013-09-10 DIAGNOSIS — M899 Disorder of bone, unspecified: Secondary | ICD-10-CM

## 2013-09-10 LAB — POCT URINALYSIS DIPSTICK
Bilirubin, UA: NEGATIVE
Blood, UA: NEGATIVE
Ketones, UA: NEGATIVE
Nitrite, UA: NEGATIVE
Protein, UA: NEGATIVE
Spec Grav, UA: 1.02
Urobilinogen, UA: 0.2
pH, UA: 7

## 2013-09-10 NOTE — Patient Instructions (Addendum)
Pelvic and breast  In early October, call if you need me before  We will call about your urine test and let you know if you need antibiotics   Blood sugar needs to improve, please cut back on carbs   Continue regular  Physical exercise    PLEASE take pravachol every night cholesterol is too high increasing your heart disease risk  Sept 30 or after microalb, fasting lipid, cmp and EGFR and HBA1C  You need a bone density test andalso colonoscopy will be due in September, we will refer you for both  Please take calcium with D every day for bone health

## 2013-09-12 LAB — URINE CULTURE: Colony Count: 90000

## 2013-09-13 DIAGNOSIS — B351 Tinea unguium: Secondary | ICD-10-CM | POA: Diagnosis not present

## 2013-09-13 DIAGNOSIS — L851 Acquired keratosis [keratoderma] palmaris et plantaris: Secondary | ICD-10-CM | POA: Diagnosis not present

## 2013-09-25 DIAGNOSIS — H409 Unspecified glaucoma: Secondary | ICD-10-CM | POA: Diagnosis not present

## 2013-09-25 DIAGNOSIS — H4011X0 Primary open-angle glaucoma, stage unspecified: Secondary | ICD-10-CM | POA: Diagnosis not present

## 2013-10-06 ENCOUNTER — Emergency Department (HOSPITAL_COMMUNITY)
Admission: EM | Admit: 2013-10-06 | Discharge: 2013-10-06 | Disposition: A | Discharge status: Home / Self Care | Payer: Medicare Other | Attending: Emergency Medicine | Admitting: Emergency Medicine

## 2013-10-06 ENCOUNTER — Encounter (HOSPITAL_COMMUNITY): Payer: Self-pay | Admitting: Emergency Medicine

## 2013-10-06 DIAGNOSIS — Z7982 Long term (current) use of aspirin: Secondary | ICD-10-CM | POA: Diagnosis not present

## 2013-10-06 DIAGNOSIS — Z79899 Other long term (current) drug therapy: Secondary | ICD-10-CM | POA: Diagnosis not present

## 2013-10-06 DIAGNOSIS — Z8719 Personal history of other diseases of the digestive system: Secondary | ICD-10-CM | POA: Diagnosis not present

## 2013-10-06 DIAGNOSIS — Z8742 Personal history of other diseases of the female genital tract: Secondary | ICD-10-CM | POA: Diagnosis not present

## 2013-10-06 DIAGNOSIS — E785 Hyperlipidemia, unspecified: Secondary | ICD-10-CM | POA: Diagnosis not present

## 2013-10-06 DIAGNOSIS — I10 Essential (primary) hypertension: Secondary | ICD-10-CM | POA: Diagnosis not present

## 2013-10-06 DIAGNOSIS — I959 Hypotension, unspecified: Secondary | ICD-10-CM | POA: Insufficient documentation

## 2013-10-06 DIAGNOSIS — E119 Type 2 diabetes mellitus without complications: Secondary | ICD-10-CM | POA: Insufficient documentation

## 2013-10-06 DIAGNOSIS — R739 Hyperglycemia, unspecified: Secondary | ICD-10-CM

## 2013-10-06 LAB — CBG MONITORING, ED
Glucose-Capillary: 349 mg/dL — ABNORMAL HIGH (ref 70–99)
Glucose-Capillary: 244 mg/dL — ABNORMAL HIGH (ref 70–99)

## 2013-10-06 LAB — URINALYSIS, ROUTINE W REFLEX MICROSCOPIC
Bilirubin Urine: NEGATIVE
Glucose, UA: >1000 mg/dL — AB
Hgb urine dipstick: NEGATIVE
Ketones, ur: NEGATIVE mg/dL
Leukocytes, UA: NEGATIVE
Nitrite: NEGATIVE
Protein, ur: NEGATIVE mg/dL
Specific Gravity, Urine: 1.01 (ref 1.005–1.030)
Urobilinogen, UA: 0.2 mg/dL (ref 0.0–1.0)
pH: 6 (ref 5.0–8.0)

## 2013-10-06 LAB — CBC
HCT: 35.9 % — ABNORMAL LOW (ref 36.0–46.0)
Hemoglobin: 12.2 g/dL (ref 12.0–15.0)
MCH: 28.6 pg (ref 26.0–34.0)
MCHC: 34 g/dL (ref 30.0–36.0)
MCV: 84.1 fL (ref 78.0–100.0)
Platelets: 201 10*3/uL (ref 150–400)
RBC: 4.27 MIL/uL (ref 3.87–5.11)
RDW: 13.1 % (ref 11.5–15.5)
WBC: 6 10*3/uL (ref 4.0–10.5)

## 2013-10-06 LAB — BASIC METABOLIC PANEL
BUN: 19 mg/dL (ref 6–23)
CO2: 26 mEq/L (ref 19–32)
Calcium: 10.1 mg/dL (ref 8.4–10.5)
Chloride: 94 mEq/L — ABNORMAL LOW (ref 96–112)
Creatinine, Ser: 0.96 mg/dL (ref 0.50–1.10)
GFR calc Af Amer: 67 mL/min — ABNORMAL LOW (ref >90)
GFR calc non Af Amer: 58 mL/min — ABNORMAL LOW (ref >90)
Glucose, Bld: 337 mg/dL — ABNORMAL HIGH (ref 70–99)
Potassium: 4 mEq/L (ref 3.7–5.3)
Sodium: 135 mEq/L — ABNORMAL LOW (ref 137–147)

## 2013-10-06 LAB — URINE MICROSCOPIC-ADD ON

## 2013-10-06 MED ORDER — INSULIN ASPART 100 UNIT/ML ~~LOC~~ SOLN
10.0000 [IU] | Freq: Once | SUBCUTANEOUS | Status: AC
  Administered 2013-10-06: 10 [IU] via SUBCUTANEOUS
  Filled 2013-10-06: qty 1

## 2013-10-06 MED ORDER — SODIUM CHLORIDE 0.9 % IV BOLUS (SEPSIS)
1000.0000 mL | Freq: Once | INTRAVENOUS | Status: AC
  Administered 2013-10-06: 1000 mL via INTRAVENOUS

## 2013-10-06 NOTE — Discharge Instructions (Signed)
Return to the ED with any concerns including vomiting and not able to keep down liquids, chest pain, difficulty breathing, fainting, decreased level of alertness/lethargy, or any other alarming symptoms

## 2013-10-06 NOTE — ED Provider Notes (Signed)
CSN: [id number]     Arrival date & time 10/06/13  6301 History   First MD Initiated Contact with Patient 10/06/13 [phone]     Chief Complaint  Patient presents with  . Hypotension  . Hyperglycemia     (Consider location/radiation/quality/duration/timing/severity/associated sxs/prior Treatment) HPI Pt presenting with c/o her blood sugar being high this morning.  She also checked her blood pressure at home and it was low at 110.  She states she felt somewhat dizzy as is usual for her with higher blood sugar.  She states she has not yet had her morning medications.  She at hamburgers fries and ice cream at a cookout yesterday and feels this is why her blood sugar is high today.  No chest pain or shortness of breath.  No fever/chills.  No changes in vision.  There are no other associated systemic symptoms, there are no other alleviating or modifying factors.   Past Medical History  Diagnosis Date  . Diabetes mellitus   . GERD (gastroesophageal reflux disease)   . Hyperlipidemia   . Dysrhythmia   . Blood transfusion     1980  . Hypertension     echo and stress 4/10 reports on chart, EKG ` LOV 9/12 on chart  . Female bladder prolapse   . Heart disease   . Mild aortic stenosis    Past Surgical History  Procedure Laterality Date  . Abdominal hysterectomy    . Ovary surgery      bilateral tumors removed  . Thyroidectomy    . Lymph node dissection Right     under arm  . Thyroidectomy  02/2008  . Colonoscopy w/ polypectomy    . Cataract extraction Bilateral     with IOL  . Anterior and posterior repair  04/26/2011    Procedure: ANTERIOR (CYSTOCELE) AND POSTERIOR REPAIR (RECTOCELE);  Surgeon: Reece Packer, MD;  Location: [facility];  Service: Urology;  Laterality: N/A;  . Vaginal prolapse repair  04/26/2011    Procedure: VAGINAL VAULT SUSPENSION;  Surgeon: Reece Packer, MD;  Location: [facility];  Service: Urology;  Laterality: N/A;  with Graft  10x6  . Transthoracic echocardiogram   08/2011    EF=>55%, mild conc LVH; trace MR; mild TR; mild-mod AV calcification with mild valvular AV stenosis  . Nm myocar perf wall motion  2010    dipyridamole - mild-mod in intenstiy perfusion defect in mid anterior, mid anteroseptal wall, EF 70%  . Left heart cath  09/10/2008    normal coronary arteries, normal LV systolic function, EF 93% (Dr. [name])   Family History  Problem Relation Age of Onset  . Heart disease Mother     MI  . Stomach cancer Mother   . Heart disease Father     MI  . Liver cancer Sister   . Bone cancer Sister   . Stroke Maternal Grandfather   . Heart attack Paternal Grandfather   . Hypertension Sister   . Hypertension Sister   . Hypertension Child    History  Substance Use Topics  . Smoking status: Never Smoker   . Smokeless tobacco: Never Used  . Alcohol Use: No     Comment: socially- none x 30 years   OB History   Grav Para Term Preterm Abortions TAB SAB Ect Mult Living                 Review of Systems ROS reviewed and all otherwise negative except for mentioned in HPI  Allergies  Review of patient's allergies indicates no active allergies.  Home Medications   Prior to Admission medications   Medication Sig Start Date End Date Taking? Authorizing Provider  amLODipine (NORVASC) 10 MG tablet Take 10 mg by mouth every morning.  05/29/13  Yes Fayrene Helper, MD  aspirin 81 MG tablet Take 81 mg by mouth every morning.    Yes Historical Provider, MD  benazepril (LOTENSIN) 40 MG tablet Take 1 tablet (40 mg total) by mouth daily. 05/29/13  Yes Fayrene Helper, MD  cloNIDine (CATAPRES) 0.2 MG tablet Take 0.2 mg by mouth at bedtime. 05/29/13 05/29/14 Yes Fayrene Helper, MD  ferrous sulfate 325 (65 FE) MG tablet Take 325 mg by mouth daily with breakfast.   Yes Historical Provider, MD  glipiZIDE (GLUCOTROL XL) 5 MG 24 hr tablet Take 5 mg by mouth daily with breakfast. 05/29/13  Yes Fayrene Helper, MD  metFORMIN (GLUCOPHAGE) 1000 MG  tablet Take 1,000 mg by mouth 2 (two) times daily with a meal.   Yes Historical Provider, MD  Multiple Vitamins-Minerals (CENTRUM) tablet Take 1 tablet by mouth every morning.    Yes Historical Provider, MD  Omega-3 Fatty Acids (FISH OIL) 1200 MG CAPS Take 1 capsule by mouth every morning.    Yes Historical Provider, MD  pravastatin (PRAVACHOL) 40 MG tablet Take 40 mg by mouth every morning.    Yes Historical Provider, MD  Travoprost, BAK Free, (TRAVATAN) 0.004 % SOLN ophthalmic solution Place 1 drop into both eyes at bedtime.    Yes Historical Provider, MD  triamterene-hydrochlorothiazide (MAXZIDE) 75-50 MG per tablet Take 1 tablet by mouth daily. 05/29/13 05/29/14 Yes Fayrene Helper, MD   BP 110/67  Pulse 70  Temp(Src) 97.8 F (36.6 C) (Oral)  Resp 20  SpO2 98% Vitals reviewed Physical Exam Physical Examination: General appearance - alert, well appearing, and in no distress Mental status - alert, oriented to person, place, and time Eyes - no conjunctival injection, no scleral icterus Mouth - mucous membranes moist, pharynx normal without lesions Chest - clear to auscultation, no wheezes, rales or rhonchi, symmetric air entry Heart - normal rate, regular rhythm, normal S1, S2, no murmurs, rubs, clicks or gallops Abdomen - soft, nontender, nondistended, no masses or organomegaly Extremities - peripheral pulses normal, no pedal edema, no clubbing or cyanosis Skin - normal coloration and turgor, no rashes  ED Course  Procedures (including critical care time) Labs Review Labs Reviewed  CBC - Abnormal; Notable for the following:    HCT 35.9 (*)    All other components within normal limits  BASIC METABOLIC PANEL - Abnormal; Notable for the following:    Sodium 135 (*)    Chloride 94 (*)    Glucose, Bld 337 (*)    GFR calc non Af Amer 58 (*)    GFR calc Af Amer 67 (*)    All other components within normal limits  URINALYSIS, ROUTINE W REFLEX MICROSCOPIC - Abnormal; Notable for the  following:    Glucose, UA >1000 (*)    All other components within normal limits  CBG MONITORING, ED - Abnormal; Notable for the following:    Glucose-Capillary 349 (*)    All other components within normal limits  CBG MONITORING, ED - Abnormal; Notable for the following:    Glucose-Capillary 244 (*)    All other components within normal limits  URINE MICROSCOPIC-ADD ON    Imaging Review No results found.   EKG Interpretation None  MDM   Final diagnoses:  Hyperglycemia    Pt presenting with c/o blood sugar elevation and concern that her blood pressure was lower this morning at home than usual.  Blood sugar in 300s, lowered to 200s after fluid and insulin.  No signs of DKA or infection.  BP stable in the ED.  Discharged with strict return precautions.  Pt agreeable with plan.   Threasa Beards, MD 10/06/13 [phone]

## 2013-10-06 NOTE — ED Notes (Signed)
Pink restricted extremity band place on pt's R arm at this time. Pt states that she has had lymph nodes removed from this side.

## 2013-10-06 NOTE — ED Notes (Addendum)
Pt states her BP at home was too low and blood glucose was too high, states it was 177 when waking up this morning. In triage BP stable. Pt states she has not taken BP or diabetic meds this morning. Denies any other symptoms.

## 2013-10-08 ENCOUNTER — Encounter (INDEPENDENT_AMBULATORY_CARE_PROVIDER_SITE_OTHER): Payer: Self-pay

## 2013-10-08 ENCOUNTER — Ambulatory Visit (INDEPENDENT_AMBULATORY_CARE_PROVIDER_SITE_OTHER): Payer: Medicare Other | Admitting: Family Medicine

## 2013-10-08 ENCOUNTER — Encounter: Payer: Self-pay | Admitting: Family Medicine

## 2013-10-08 VITALS — BP 160/84 | HR 76 | Resp 18 | Ht 65.0 in | Wt 181.0 lb

## 2013-10-08 DIAGNOSIS — I10 Essential (primary) hypertension: Secondary | ICD-10-CM | POA: Diagnosis not present

## 2013-10-08 DIAGNOSIS — N189 Chronic kidney disease, unspecified: Secondary | ICD-10-CM

## 2013-10-08 DIAGNOSIS — E1129 Type 2 diabetes mellitus with other diabetic kidney complication: Secondary | ICD-10-CM | POA: Diagnosis not present

## 2013-10-08 DIAGNOSIS — E785 Hyperlipidemia, unspecified: Secondary | ICD-10-CM

## 2013-10-08 DIAGNOSIS — E1122 Type 2 diabetes mellitus with diabetic chronic kidney disease: Secondary | ICD-10-CM

## 2013-10-08 MED ORDER — INSULIN GLARGINE 100 UNIT/ML SOLOSTAR PEN: PEN_INJECTOR | SUBCUTANEOUS | Status: DC

## 2013-10-08 MED ORDER — CLONIDINE HCL 0.1 MG PO TABS: ORAL_TABLET | ORAL | Status: DC

## 2013-10-08 NOTE — Patient Instructions (Signed)
F/u end June , call if you need me before  Blood pressure is high, increase in clonidine  Dose to 0.3mg  total at bedtime, take one 0.2mg  tablet and one 0.1mg  tablet, continue all other BP ,meds as before  You will be referred for test to check circulation to kidney also  For bloood sugar control, you NEED to eat same time every days and control carb intake also  Stop glipizide. Continue metformin as before  New is lantus , once daily, morning or evening at the same time at the same time  Start lantus at 7 units, script says 20, do NOT do that, you will increase dose every 3 days by 3 units , if fasting blood sugar remains too high. PLEASE ensure you CLEARLY understand these directions when nurse discusses this  Ok to test blood sugar up to 3 times daily since on insulin, we will send in new directions  Goal for fasting blood sugar ranges from 90 to 130 and 2 hours after any meal or at bedtime should be between 130 to 180.

## 2013-10-21 ENCOUNTER — Telehealth: Payer: Self-pay

## 2013-10-21 NOTE — Telephone Encounter (Signed)
Pt was referred by Dr. Moshe Cipro for screening colonoscopy.  It is noted that she had an EGD by Dr. Oneida Alar in 12/05/2008.   She said that Dr. Oneida Alar told her that she would not need a colonoscopy. ( She also said that she has previously had a colonoscopy but not sure when. She is not having any problems.  Please advise!

## 2013-10-23 NOTE — Telephone Encounter (Signed)
PLEASE CALL PT. SHE HAD A TCS AT AGE 73. SHE WILL NEED A TCS EVERY 10 YEARS WHICH WOULD HAVE BEEN WHEN SHE IS 71.  SHE SHOULD HAVE ONE IN 2015. IF SHE HAS NOT POLYPS ON TCS IN 2015 THEN SHE WILL NOT NEED ROUTINE SCREENING FOR TCS AFTER THE AGE OF 76.

## 2013-10-28 ENCOUNTER — Other Ambulatory Visit: Payer: Self-pay

## 2013-10-28 ENCOUNTER — Telehealth: Payer: Self-pay

## 2013-10-28 DIAGNOSIS — Z1211 Encounter for screening for malignant neoplasm of colon: Secondary | ICD-10-CM

## 2013-10-28 NOTE — Telephone Encounter (Signed)
Gastroenterology Pre-Procedure Review  Request Date: 10/28/2013 Requesting Physician: Dr. Moshe Cipro  PATIENT REVIEW QUESTIONS: The patient responded to the following health history questions as indicated:    1. Diabetes Melitis: YES 2. Joint replacements in the past 12 months: no 3. Major health problems in the past 3 months: no 4. Has an artificial valve or MVP: no 5. Has a defibrillator: no 6. Has been advised in past to take antibiotics in advance of a procedure like teeth cleaning: no    MEDICATIONS & ALLERGIES:    Patient reports the following regarding taking any blood thinners:   Plavix? no Aspirin? YES Coumadin? no  Patient confirms/reports the following medications:  Current Outpatient Prescriptions  Medication Sig Dispense Refill  . amLODipine (NORVASC) 10 MG tablet Take 10 mg by mouth every morning.       Marland Kitchen aspirin 81 MG tablet Take 81 mg by mouth every morning.       . benazepril (LOTENSIN) 40 MG tablet Take 1 tablet (40 mg total) by mouth daily.  90 tablet  1  . cloNIDine (CATAPRES) 0.2 MG tablet Take 0.2 mg by mouth at bedtime.      . ferrous sulfate 325 (65 FE) MG tablet Take 325 mg by mouth daily with breakfast.      . Insulin Glargine (LANTUS) 100 UNIT/ML Solostar Pen 7-10 units in AM      . metFORMIN (GLUCOPHAGE) 1000 MG tablet Take 1,000 mg by mouth 2 (two) times daily with a meal.      . Multiple Vitamins-Minerals (CENTRUM) tablet Take 1 tablet by mouth every morning.       . Omega-3 Fatty Acids (FISH OIL) 1200 MG CAPS Take 1 capsule by mouth every morning.       . pravastatin (PRAVACHOL) 40 MG tablet Take 40 mg by mouth every morning.       . Travoprost, BAK Free, (TRAVATAN) 0.004 % SOLN ophthalmic solution Place 1 drop into both eyes at bedtime.       . triamterene-hydrochlorothiazide (MAXZIDE) 75-50 MG per tablet Take 1 tablet by mouth daily.  90 tablet  1  . cloNIDine (CATAPRES) 0.1 MG tablet One tablet at bedtime, in addition to clonidine 0.2mg  tablet  90  tablet  3   No current facility-administered medications for this visit.    Patient confirms/reports the following allergies:  No Known Allergies  No orders of the defined types were placed in this encounter.    AUTHORIZATION INFORMATION Primary Insurance:  ID #:   Group #:  Pre-Cert / Auth required: Pre-Cert / Auth #:   Secondary Insurance:   ID #:  Group #:  Pre-Cert / Auth required: Pre-Cert / Auth #:   SCHEDULE INFORMATION: Procedure has been scheduled as follows:  Date: 11/18/2013                     Time:  9:30 AM Location: [facility] Short Stay  This Gastroenterology Pre-Precedure Review Form is being routed to the following provider(s): Barney Drain, MD

## 2013-10-28 NOTE — Telephone Encounter (Signed)
See separate triage.  

## 2013-10-28 NOTE — Telephone Encounter (Signed)
Called pt and she is aware  ?

## 2013-10-29 NOTE — Telephone Encounter (Signed)
MOVI PREP SPLIT DOSING- FULL LIQUIDS WITH BREAKFAST.  HOLD LANTUS  AM OF TCS. HALF MAXZIDE ON DAY BEFORE TCS. HOLD ON AM OF TCS.

## 2013-11-01 ENCOUNTER — Encounter (HOSPITAL_COMMUNITY): Payer: Self-pay | Admitting: Pharmacy Technician

## 2013-11-04 MED ORDER — PEG-KCL-NACL-NASULF-NA ASC-C 100 G PO SOLR: 1.0000 | ORAL | Status: DC

## 2013-11-04 NOTE — Telephone Encounter (Signed)
Rx sent to the pharmacy and instructions mailed to pt.  

## 2013-11-04 NOTE — Telephone Encounter (Signed)
Pt called and rescheduled colonoscopy from 11/18/2013 to 12/02/2013 at 8:30 AM. Hoyle Sauer and Maudie Mercury are aware in endo.

## 2013-11-11 ENCOUNTER — Encounter: Payer: Self-pay | Admitting: Family Medicine

## 2013-11-11 ENCOUNTER — Ambulatory Visit (INDEPENDENT_AMBULATORY_CARE_PROVIDER_SITE_OTHER): Payer: Medicare Other | Admitting: Family Medicine

## 2013-11-11 VITALS — BP 148/82 | HR 77 | Resp 18 | Wt 174.0 lb

## 2013-11-11 DIAGNOSIS — Z13820 Encounter for screening for osteoporosis: Secondary | ICD-10-CM | POA: Diagnosis not present

## 2013-11-11 DIAGNOSIS — E663 Overweight: Secondary | ICD-10-CM

## 2013-11-11 DIAGNOSIS — N189 Chronic kidney disease, unspecified: Secondary | ICD-10-CM

## 2013-11-11 DIAGNOSIS — E1129 Type 2 diabetes mellitus with other diabetic kidney complication: Secondary | ICD-10-CM

## 2013-11-11 DIAGNOSIS — E1122 Type 2 diabetes mellitus with diabetic chronic kidney disease: Secondary | ICD-10-CM

## 2013-11-11 DIAGNOSIS — I10 Essential (primary) hypertension: Secondary | ICD-10-CM | POA: Diagnosis not present

## 2013-11-11 DIAGNOSIS — E785 Hyperlipidemia, unspecified: Secondary | ICD-10-CM

## 2013-11-11 LAB — GLUCOSE, POCT (MANUAL RESULT ENTRY): POC Glucose: 143 mg/dl — AB (ref 70–99)

## 2013-11-11 MED ORDER — AMLODIPINE BESYLATE 10 MG PO TABS: 10.0000 mg | ORAL_TABLET | Freq: Every morning | ORAL | Status: DC

## 2013-11-11 MED ORDER — SPIRONOLACTONE 25 MG PO TABS: 25.0000 mg | ORAL_TABLET | Freq: Two times a day (BID) | ORAL | Status: DC

## 2013-11-11 MED ORDER — TRIAMTERENE-HCTZ 75-50 MG PO TABS: 1.0000 | ORAL_TABLET | Freq: Every day | ORAL | Status: DC

## 2013-11-11 MED ORDER — METFORMIN HCL 1000 MG PO TABS: 1000.0000 mg | ORAL_TABLET | Freq: Two times a day (BID) | ORAL | Status: DC

## 2013-11-11 NOTE — Patient Instructions (Signed)
F/U week of July 28 , call if you need me before  New for blood pressure is spironolactone in place of clonidine. STOP clonidine effective today, and start spironolactone one twice daily  Increase lantus to 13 units daily, then after 2 weeks to 15 units daily if your fasting blood sugars remain high  You absolutely need to eat  On a regular schedule to achieve blood sugar control  We will look at changing you amlodipine to [place] after you next visit , so we can stop pravachol  Non fasting chem 7 on July 9  Fasting lipid, cmp and EGGFr, HBa1C  July 26 or after, but bEFORE day of visit  You need to attend diabetic classes here which will start soon to help you along, you are already improving but with help you will get there  It is important that you exercise regularly at least 30 minutes 5 times a week. If you develop chest pain, have severe difficulty breathing, or feel very tired, stop exercising immediately and seek medical attention   Goal for fasting blood sugar ranges from 100 to 130 and 2 hours after any meal or at bedtime should be between 130 to 180.

## 2013-11-11 NOTE — Progress Notes (Signed)
   Subjective:    Patient ID: Joanna Brown, female    DOB: 04-06-1941, 73 y.o.   MRN: [id number]  HPI The PT is here for follow up and re-evaluation of chronic medical conditions, in particular uncontrolled diabetes and hypertension, medication management and review of any available recent lab and radiology data. Specifically uncontrolled  Preventive health is updated, specifically  Cancer screening and Immunization.   . The PT denies any adverse reactions to current medications since the last visit.  States her FBG are between 150 to  200 most mornings,, however eating habits are still challenge which she is working on. Denies hypoglycemic episodes, polyuria, polydipsia or blurred vision    Review of Systems See HPI Denies recent fever or chills. Denies sinus pressure, nasal congestion, ear pain or sore throat. Denies chest congestion, productive cough or wheezing. Denies chest pains, palpitations and leg swelling Denies abdominal pain, nausea, vomiting,diarrhea or constipation.   Denies dysuria, frequency, hesitancy or incontinence. Denies joint pain, swelling and limitation in mobility. Denies headaches, seizures, numbness, or tingling. Denies depression, anxiety or insomnia. Denies skin break down or rash.        Objective:   Physical Exam BP 148/82  Pulse 77  Resp 18  Wt 174 lb (78.926 kg)  SpO2 98% Patient alert and oriented and in no cardiopulmonary distress.  HEENT: No facial asymmetry, EOMI,   oropharynx pink and moist.  Neck supple no JVD, no mass.  Chest: Clear to auscultation bilaterally.  CVS: S1, S2 no   murmur, no S3.Regular rate.  ABD: Soft non tender.   Ext: No edema  MS: Adequate ROM spine, shoulders, hips and knees.  Skin: Intact, no ulcerations or rash noted.  Psych: Good eye contact, normal affect. Memory intact not anxious or depressed appearing.  CNS: CN 2-12 intact, power,  normal throughout.no focal deficits noted.          Assessment & Plan:  Hypertension goal BP (blood pressure) < 130/80 Uncontrolled , stop clonidine , start spironolactone DASH diet and commitment to daily physical activity for a minimum of 30 minutes discussed and encouraged, as a part of hypertension management. The importance of attaining a healthy weight is also discussed. Rept chem 7 in 3weeks to ensure potassium not too elevated, and look at renal function   Type 2 diabetes mellitus with chronic kidney disease Per pt reporting improving blood suagrs since starting insulin , but still not at goal. Pt  Toy titrate insulin dose as directed, and she is advised to call in with concerns. Blood sugar targets reviewed, she is to test 2 to 3 times daily Patient advised to reduce carb and sweets, commit to regular physical activity, take meds as prescribed, test blood as directed, and attempt to lose weight, to improve blood sugar control.   Hyperlipidemia LDL goal <100 Uncontrolled and not at goal Hyperlipidemia:Low fat diet discussed and encouraged.  Updated lab needed at/ before next visit. Hope to change to combination pill with amlodipine in the future to simplify her medical regime   Overweight (BMI 25.0-29.9) Improved. Pt applauded on succesful weight loss through lifestyle change, and encouraged to continue same. Weight loss goal set for the next several months.

## 2013-11-21 DIAGNOSIS — I10 Essential (primary) hypertension: Secondary | ICD-10-CM | POA: Diagnosis not present

## 2013-11-21 LAB — BASIC METABOLIC PANEL
BUN: 19 mg/dL (ref 6–23)
CO2: 24 mEq/L (ref 19–32)
Calcium: 10.1 mg/dL (ref 8.4–10.5)
Chloride: 97 mEq/L (ref 96–112)
Creat: 1.16 mg/dL — ABNORMAL HIGH (ref 0.50–1.10)
Glucose, Bld: 263 mg/dL — ABNORMAL HIGH (ref 70–99)
Potassium: 5.3 mEq/L (ref 3.5–5.3)
Sodium: 133 mEq/L — ABNORMAL LOW (ref 135–145)

## 2013-11-26 DIAGNOSIS — L851 Acquired keratosis [keratoderma] palmaris et plantaris: Secondary | ICD-10-CM | POA: Diagnosis not present

## 2013-11-26 DIAGNOSIS — B351 Tinea unguium: Secondary | ICD-10-CM | POA: Diagnosis not present

## 2013-11-26 DIAGNOSIS — E1149 Type 2 diabetes mellitus with other diabetic neurological complication: Secondary | ICD-10-CM | POA: Diagnosis not present

## 2013-11-27 DIAGNOSIS — N3000 Acute cystitis without hematuria: Secondary | ICD-10-CM | POA: Insufficient documentation

## 2013-11-27 NOTE — Assessment & Plan Note (Signed)
Deteriorated Patient advised to reduce carb and sweets, commit to regular physical activity, take meds as prescribed, test blood as directed, and attempt to lose weight, to improve blood sugar control. Pt will likely require insulin, still resistant at this time, she has been on insulin in the past

## 2013-11-27 NOTE — Progress Notes (Signed)
   Subjective:    Patient ID: Joanna Brown, female    DOB: 26-May-1940, 73 y.o.   MRN: [id number]  HPI Pt in for evaluation of uncontrolled diabetes following recent ED visit for hyperglycemia, blood suagr in the ED 2 days prior was 336, she denies  Any recnet infection or any significant change iun diet. Pt has been on insulin in the past, had been able to change to oral medicatiuons only , however , now she clearly requires insulin again She has been having excessive thirst, dry mouh , and excessive urination also c/o fatigue   Review of Systems See HPI Denies recent fever or chills. Denies sinus pressure, nasal congestion, ear pain or sore throat. Denies chest congestion, productive cough or wheezing. Denies chest pains, palpitations and leg swelling Denies abdominal pain, nausea, vomiting,diarrhea or constipation.   Denies dysuria  Denies headaches, seizures, numbness, or tingling. Denies depression, anxiety or insomnia. Denies skin break down or rash.        Objective:   Physical Exam BP 160/84  Pulse 76  Resp 18  Ht 5\' 5"  (1.651 m)  Wt 181 lb (82.101 kg)  BMI 30.12 kg/m2  SpO2 95% Patient alert and oriented and in no cardiopulmonary distress.  HEENT: No facial asymmetry, EOMI,   oropharynx pink and moist.  Neck supple no JVD, no mass.  Chest: Clear to auscultation bilaterally.  CVS: S1, S2 no murmurs, no S3.Regular rate.  ABD: Soft non tender.   Ext: No edema  MS: Adequate ROM spine, shoulders, hips and knees.  Skin: Intact, no ulcerations or rash noted.  Psych: Good eye contact, normal affect. Memory intact not anxious or depressed appearing.  CNS: CN 2-12 intact, power,  normal throughout.no focal deficits noted.        Assessment & Plan:  Hypertension goal BP (blood pressure) < 130/80 Uncontrolled , dose increase in clonidine DASH diet and commitment to daily physical activity for a minimum of 30 minutes discussed and encouraged, as a part of  hypertension management. The importance of attaining a healthy weight is also discussed. F/u in 3 to 4 weeks  Type 2 diabetes mellitus with chronic kidney disease u ncontrolled Diabetic ed, and insulin teaching done by me for 10 minutes with titration of lantus every 3 days depending on avg fasting blood sugar Patient advised to reduce carb and sweets, commit to regular physical activity, take meds as prescribed, test blood as directed, and attempt to lose weight, to improve blood sugar control. Pt encouraged to attend diabetic ed class also  Hyperlipidemia LDL goal <100 Uncontrolled Hyperlipidemia:Low fat diet discussed and encouraged.

## 2013-11-27 NOTE — Assessment & Plan Note (Signed)
Controlled, no change in medication DASH diet and commitment to daily physical activity for a minimum of 30 minutes discussed and encouraged, as a part of hypertension management. The importance of attaining a healthy weight is also discussed.  

## 2013-11-27 NOTE — Assessment & Plan Note (Signed)
Improved , though not at goal, continue pravachol Hyperlipidemia:Low fat diet discussed and encouraged.  Updated lab needed at/ before next visit.

## 2013-11-27 NOTE — Assessment & Plan Note (Addendum)
u ncontrolled Diabetic ed, and insulin teaching done by me for 10 minutes with titration of lantus every 3 days depending on avg fasting blood sugar Patient advised to reduce carb and sweets, commit to regular physical activity, take meds as prescribed, test blood as directed, and attempt to lose weight, to improve blood sugar control. Pt encouraged to attend diabetic ed class also

## 2013-11-27 NOTE — Assessment & Plan Note (Addendum)
Uncontrolled and not at goal Hyperlipidemia:Low fat diet discussed and encouraged.  Updated lab needed at/ before next visit. Hope to change to combination pill with amlodipine in the future to simplify her medical regime

## 2013-11-27 NOTE — Assessment & Plan Note (Addendum)
Uncontrolled , stop clonidine , start spironolactone DASH diet and commitment to daily physical activity for a minimum of 30 minutes discussed and encouraged, as a part of hypertension management. The importance of attaining a healthy weight is also discussed. Rept chem 7 in 3weeks to ensure potassium not too elevated, and look at renal function

## 2013-11-27 NOTE — Assessment & Plan Note (Signed)
Urinalysis is mildly abnormal , and physical exam inconsistent with infection, will hold on antibiotic  Until c/s available, pot encouraged to increase water intake and void often

## 2013-11-27 NOTE — Assessment & Plan Note (Signed)
Uncontrolled Hyperlipidemia:Low fat diet discussed and encouraged.

## 2013-11-27 NOTE — Progress Notes (Signed)
   Subjective:    Patient ID: Joanna Brown, female    DOB: 05/05/1941, 73 y.o.   MRN: [id number]  HPI The PT is here for follow up and re-evaluation of chronic medical conditions, medication management and review of any available recent lab and radiology data.  Preventive health is updated, specifically  Cancer screening and Immunization. Needs dexa and colon screening will refer  Questions or concerns regarding consultations or procedures which the PT has had in the interim are  addressed. The PT denies any adverse reactions to current medications since the last visit.  Left flank pain for past 2 weeks with urinary frequency and malodor x 5 days. Denies fever or chill, no nausea or vomiting      Review of Systems See HPI Denies recent fever or chills. Denies sinus pressure, nasal congestion, ear pain or sore throat. Denies chest congestion, productive cough or wheezing. Denies chest pains, palpitations and leg swelling Denies abdominal pain, nausea, vomiting,diarrhea or constipation.    Denies  limitation in mobility. Denies headaches, seizures, numbness, or tingling. Denies depression, anxiety or insomnia. Denies skin break down or rash.        Objective:   Physical Exam BP 130/78  Pulse 95  Resp 16  Wt 177 lb (80.287 kg)  SpO2 97% Patient alert and oriented and in no cardiopulmonary distress.  HEENT: No facial asymmetry, EOMI,   oropharynx pink and moist.  Neck supple no JVD, no mass.  Chest: Clear to auscultation bilaterally.  CVS: S1, S2 no murmurs, no S3.Regular rate.  ABD: Soft , mild tenderness in left flank, no suprapubic tenderness, no organomegaly or mass palpable, normal BS   Ext: No edema  MS: Adequate ROM spine, shoulders, hips and knees.  Skin: Intact, no ulcerations or rash noted.  Psych: Good eye contact, normal affect. Memory intact not anxious or depressed appearing.  CNS: CN 2-12 intact, power,  normal throughout.no focal deficits  noted.        Assessment & Plan:  Hyperlipidemia LDL goal <100 Improved , though not at goal, continue pravachol Hyperlipidemia:Low fat diet discussed and encouraged.  Updated lab needed at/ before next visit.   Type 2 diabetes mellitus with chronic kidney disease Deteriorated Patient advised to reduce carb and sweets, commit to regular physical activity, take meds as prescribed, test blood as directed, and attempt to lose weight, to improve blood sugar control. Pt will likely require insulin, still resistant at this time, she has been on insulin in the past  Hypertension goal BP (blood pressure) < 130/80 Controlled, no change in medication DASH diet and commitment to daily physical activity for a minimum of 30 minutes discussed and encouraged, as a part of hypertension management. The importance of attaining a healthy weight is also discussed.   Acute cystitis Urinalysis is mildly abnormal , and physical exam inconsistent with infection, will hold on antibiotic  Until c/s available, pot encouraged to increase water intake and void often  DISORDER OF BONE AND CARTILAGE UNSPECIFIED dexa requested to eval for bone loss

## 2013-11-27 NOTE — Assessment & Plan Note (Signed)
dexa requested to eval for bone loss

## 2013-11-27 NOTE — Assessment & Plan Note (Signed)
Uncontrolled , dose increase in clonidine DASH diet and commitment to daily physical activity for a minimum of 30 minutes discussed and encouraged, as a part of hypertension management. The importance of attaining a healthy weight is also discussed. F/u in 3 to 4 weeks

## 2013-11-27 NOTE — Assessment & Plan Note (Addendum)
Improved. Pt applauded on succesful weight loss through lifestyle change, and encouraged to continue same. Weight loss goal set for the next several months.  

## 2013-11-27 NOTE — Assessment & Plan Note (Signed)
Per pt reporting improving blood suagrs since starting insulin , but still not at goal. Pt  Toy titrate insulin dose as directed, and she is advised to call in with concerns. Blood sugar targets reviewed, she is to test 2 to 3 times daily Patient advised to reduce carb and sweets, commit to regular physical activity, take meds as prescribed, test blood as directed, and attempt to lose weight, to improve blood sugar control.

## 2013-12-02 ENCOUNTER — Ambulatory Visit (HOSPITAL_COMMUNITY)
Admission: RE | Admit: 2013-12-02 | Discharge: 2013-12-02 | Disposition: A | Discharge status: Home / Self Care | Payer: Medicare Other | Source: Ambulatory Visit | Attending: Gastroenterology | Admitting: Gastroenterology

## 2013-12-02 ENCOUNTER — Encounter (HOSPITAL_COMMUNITY): Payer: Self-pay | Admitting: *Deleted

## 2013-12-02 ENCOUNTER — Encounter (HOSPITAL_COMMUNITY)
Admission: RE | Disposition: A | Discharge status: Home / Self Care | Payer: Self-pay | Source: Ambulatory Visit | Attending: Gastroenterology

## 2013-12-02 DIAGNOSIS — Z1211 Encounter for screening for malignant neoplasm of colon: Secondary | ICD-10-CM

## 2013-12-02 DIAGNOSIS — D126 Benign neoplasm of colon, unspecified: Secondary | ICD-10-CM

## 2013-12-02 DIAGNOSIS — Z7982 Long term (current) use of aspirin: Secondary | ICD-10-CM | POA: Insufficient documentation

## 2013-12-02 DIAGNOSIS — K648 Other hemorrhoids: Secondary | ICD-10-CM | POA: Diagnosis not present

## 2013-12-02 DIAGNOSIS — E785 Hyperlipidemia, unspecified: Secondary | ICD-10-CM | POA: Insufficient documentation

## 2013-12-02 DIAGNOSIS — K219 Gastro-esophageal reflux disease without esophagitis: Secondary | ICD-10-CM | POA: Insufficient documentation

## 2013-12-02 DIAGNOSIS — Z794 Long term (current) use of insulin: Secondary | ICD-10-CM | POA: Insufficient documentation

## 2013-12-02 DIAGNOSIS — I10 Essential (primary) hypertension: Secondary | ICD-10-CM | POA: Diagnosis not present

## 2013-12-02 DIAGNOSIS — Q438 Other specified congenital malformations of intestine: Secondary | ICD-10-CM | POA: Diagnosis not present

## 2013-12-02 DIAGNOSIS — F17200 Nicotine dependence, unspecified, uncomplicated: Secondary | ICD-10-CM | POA: Insufficient documentation

## 2013-12-02 DIAGNOSIS — E119 Type 2 diabetes mellitus without complications: Secondary | ICD-10-CM | POA: Insufficient documentation

## 2013-12-02 DIAGNOSIS — K6389 Other specified diseases of intestine: Secondary | ICD-10-CM | POA: Insufficient documentation

## 2013-12-02 HISTORY — PX: COLONOSCOPY: SHX5424

## 2013-12-02 LAB — GLUCOSE, CAPILLARY: Glucose-Capillary: 131 mg/dL — ABNORMAL HIGH (ref 70–99)

## 2013-12-02 SURGERY — COLONOSCOPY
Anesthesia: Moderate Sedation

## 2013-12-02 MED ORDER — MEPERIDINE HCL 100 MG/ML IJ SOLN
INTRAMUSCULAR | Status: AC
  Filled 2013-12-02: qty 2

## 2013-12-02 MED ORDER — STERILE WATER FOR IRRIGATION IR SOLN
Status: DC | PRN
  Administered 2013-12-02: 09:00:00

## 2013-12-02 MED ORDER — MIDAZOLAM HCL 5 MG/5ML IJ SOLN
INTRAMUSCULAR | Status: AC
  Filled 2013-12-02: qty 10

## 2013-12-02 MED ORDER — MEPERIDINE HCL 100 MG/ML IJ SOLN
INTRAMUSCULAR | Status: DC | PRN
  Administered 2013-12-02 (×2): 25 mg via INTRAVENOUS

## 2013-12-02 MED ORDER — SODIUM CHLORIDE 0.9 % IV SOLN
INTRAVENOUS | Status: DC
  Administered 2013-12-02: 08:00:00 via INTRAVENOUS

## 2013-12-02 MED ORDER — MIDAZOLAM HCL 5 MG/5ML IJ SOLN
INTRAMUSCULAR | Status: DC | PRN
  Administered 2013-12-02 (×2): 2 mg via INTRAVENOUS
  Administered 2013-12-02 (×2): 1 mg via INTRAVENOUS

## 2013-12-02 NOTE — H&P (Signed)
Primary Care Physician:  Tula Nakayama, MD Primary Gastroenterologist:  Dr. Oneida Alar  Pre-Procedure History & Physical: HPI:  Joanna Brown is a 73 y.o. female here for [place].  Past Medical History  Diagnosis Date  . Diabetes mellitus   . GERD (gastroesophageal reflux disease)   . Hyperlipidemia   . Dysrhythmia   . Blood transfusion     1980  . Hypertension     echo and stress 4/10 reports on chart, EKG ` LOV 9/12 on chart  . Female bladder prolapse   . Heart disease   . Mild aortic stenosis     Past Surgical History  Procedure Laterality Date  . Abdominal hysterectomy    . Ovary surgery      bilateral tumors removed  . Thyroidectomy    . Lymph node dissection Right     under arm  . Thyroidectomy  02/2008  . Colonoscopy w/ polypectomy    . Cataract extraction Bilateral     with IOL  . Anterior and posterior repair  04/26/2011    Procedure: ANTERIOR (CYSTOCELE) AND POSTERIOR REPAIR (RECTOCELE);  Surgeon: Reece Packer, MD;  Location: [facility];  Service: Urology;  Laterality: N/A;  . Vaginal prolapse repair  04/26/2011    Procedure: VAGINAL VAULT SUSPENSION;  Surgeon: Reece Packer, MD;  Location: [facility];  Service: Urology;  Laterality: N/A;  with Graft  10x6  . Transthoracic echocardiogram  08/2011    EF=>55%, mild conc LVH; trace MR; mild TR; mild-mod AV calcification with mild valvular AV stenosis  . Nm myocar perf wall motion  2010    dipyridamole - mild-mod in intenstiy perfusion defect in mid anterior, mid anteroseptal wall, EF 70%  . Left heart cath  09/10/2008    normal coronary arteries, normal LV systolic function, EF 53% (Dr. [name])    Prior to Admission medications   Medication Sig Start Date End Date Taking? Authorizing Provider  aspirin 81 MG tablet Take 81 mg by mouth every morning.    Yes Historical Provider, MD  benazepril (LOTENSIN) 40 MG tablet Take 1 tablet (40 mg total) by mouth daily. 05/29/13  Yes Fayrene Helper, MD  ferrous sulfate 325 (65 FE) MG tablet Take 325 mg by mouth daily with breakfast.   Yes Historical Provider, MD  Insulin Glargine (LANTUS) 100 UNIT/ML Solostar Pen 7-10 units in AM 10/08/13  Yes Fayrene Helper, MD  Multiple Vitamins-Minerals (CENTRUM) tablet Take 1 tablet by mouth every morning.    Yes Historical Provider, MD  Omega-3 Fatty Acids (FISH OIL) 1200 MG CAPS Take 1 capsule by mouth every morning.    Yes Historical Provider, MD  pravastatin (PRAVACHOL) 40 MG tablet Take 40 mg by mouth every morning.    Yes Historical Provider, MD  Travoprost, BAK Free, (TRAVATAN) 0.004 % SOLN ophthalmic solution Place 1 drop into both eyes at bedtime.    Yes Historical Provider, MD  amLODipine (NORVASC) 10 MG tablet Take 1 tablet (10 mg total) by mouth every morning. 11/11/13   Fayrene Helper, MD  metFORMIN (GLUCOPHAGE) 1000 MG tablet Take 1 tablet (1,000 mg total) by mouth 2 (two) times daily with a meal. 11/11/13   Fayrene Helper, MD  peg 3350 powder (MOVIPREP) 100 G SOLR Take 1 kit (200 g total) by mouth as directed. 11/04/13   Danie Binder, MD  spironolactone (ALDACTONE) 25 MG tablet Take 1 tablet (25 mg total) by mouth 2 (two) times daily. 11/11/13  Fayrene Helper, MD  triamterene-hydrochlorothiazide (MAXZIDE) 75-50 MG per tablet Take 1 tablet by mouth daily. 11/11/13 11/11/14  Fayrene Helper, MD    Allergies as of 10/28/2013  . (No Known Allergies)    Family History  Problem Relation Age of Onset  . Heart disease Mother     MI  . Stomach cancer Mother   . Heart disease Father     MI  . Liver cancer Sister   . Bone cancer Sister   . Stroke Maternal Grandfather   . Heart attack Paternal Grandfather   . Hypertension Sister   . Hypertension Sister   . Hypertension Child     History   Social History  . Marital Status: Married    Spouse Name: [name]    Number of Children: 1  . Years of Education: Trade   Occupational History  . Retired    Social  History Main Topics  . Smoking status: Never Smoker   . Smokeless tobacco: Never Used  . Alcohol Use: No     Comment: socially- none x 30 years  . Drug Use: No  . Sexual Activity: Not on file   Other Topics Concern  . Not on file   Social History Narrative   Patient lives at home with spouse.   Caffeine Use: 1 cup of coffee daily    Review of Systems: See HPI, otherwise negative ROS   Physical Exam: BP 127/75  Pulse 84  Temp(Src) 97.6 F (36.4 C) (Oral)  Ht 5' 5" (1.651 m)  Wt 174 lb (78.926 kg)  BMI 28.96 kg/m2  SpO2 97% General:   Alert,  pleasant and cooperative in NAD Head:  Normocephalic and atraumatic. Neck:  Supple; Lungs:  Clear throughout to auscultation.    Heart:  Regular rate and rhythm. Abdomen:  Soft, nontender and nondistended. Normal bowel sounds, without guarding, and without rebound.   Neurologic:  Alert and  oriented x4;  grossly normal neurologically.  Impression/Plan:     SCREENING  Plan:  1. TCS TODAY

## 2013-12-02 NOTE — Discharge Instructions (Signed)
You had 3 small polyps removed FROM YOUR COLON. YOU HAVE A FLOPPY COLON WHICH MAKES IT HARD FOR YOU TO HAVE A BM. You have internal hemorrhoids.   HOLD ASPIRIN FOR 3 DAYS.  DRINK WATER TO KEEP YOUR URINE LIGHT YELLOW.  FOLLOW A HIGH FIBER DIET. AVOID ITEMS THAT CAUSE BLOATING & GAS. SEE INFO BELOW.  USE MIRALAX OR DULCOLAX AS NEEDED TO HAVE A BOWEL MOVEMENT 1 OR 2 TIMES A WEEK.  YOUR BIOPSY RESULTS SHOULD BE BACK IN 7 DAYS.  Next colonoscopy in 5-10 YEARS.   Colonoscopy Care After Read the instructions outlined below and refer to this sheet in the next week. These discharge instructions provide you with general information on caring for yourself after you leave the hospital. While your treatment has been planned according to the most current medical practices available, unavoidable complications occasionally occur. If you have any problems or questions after discharge, call DR. Teasia Zapf, [phone].  ACTIVITY  You may resume your regular activity, but move at a slower pace for the next 24 hours.   Take frequent rest periods for the next 24 hours.   Walking will help get rid of the air and reduce the bloated feeling in your belly (abdomen).   No driving for 24 hours (because of the medicine (anesthesia) used during the test).   You may shower.   Do not sign any important legal documents or operate any machinery for 24 hours (because of the anesthesia used during the test).    NUTRITION  Drink plenty of fluids.   You may resume your normal diet as instructed by your doctor.   Begin with a light meal and progress to your normal diet. Heavy or fried foods are harder to digest and may make you feel sick to your stomach (nauseated).   Avoid alcoholic beverages for 24 hours or as instructed.    MEDICATIONS  You may resume your normal medications.   WHAT YOU CAN EXPECT TODAY  Some feelings of bloating in the abdomen.   Passage of more gas than usual.   Spotting of  blood in your stool or on the toilet paper  .  IF YOU HAD POLYPS REMOVED DURING THE COLONOSCOPY:  Eat a soft diet IF YOU HAVE NAUSEA, BLOATING, ABDOMINAL PAIN, OR VOMITING.    FINDING OUT THE RESULTS OF YOUR TEST Not all test results are available during your visit. DR. Oneida Alar WILL CALL YOU WITHIN 7 DAYS OF YOUR PROCEDUE WITH YOUR RESULTS. Do not assume everything is normal if you have not heard from DR. Lorisa Scheid IN ONE WEEK, CALL HER OFFICE AT [phone].  SEEK IMMEDIATE MEDICAL ATTENTION AND CALL THE OFFICE: [phone] IF:  You have more than a spotting of blood in your stool.   Your belly is swollen (abdominal distention).   You are nauseated or vomiting.   You have a temperature over 101F.   You have abdominal pain or discomfort that is severe or gets worse throughout the day.   High-Fiber Diet A high-fiber diet changes your normal diet to include more whole grains, legumes, fruits, and vegetables. Changes in the diet involve replacing refined carbohydrates with unrefined foods. The calorie level of the diet is essentially unchanged. The Dietary Reference Intake (recommended amount) for adult males is 38 grams per day. For adult females, it is 25 grams per day. Pregnant and lactating women should consume 28 grams of fiber per day. Fiber is the intact part of a plant that is not broken down during  digestion. Functional fiber is fiber that has been isolated from the plant to provide a beneficial effect in the body. PURPOSE  Increase stool bulk.   Ease and regulate bowel movements.   Lower cholesterol.  INDICATIONS THAT YOU NEED MORE FIBER  Constipation and hemorrhoids.   Uncomplicated diverticulosis (intestine condition) and irritable bowel syndrome.   Weight management.   As a protective measure against hardening of the arteries (atherosclerosis), diabetes, and cancer.   GUIDELINES FOR INCREASING FIBER IN THE DIET  Start adding fiber to the diet slowly. A gradual  increase of about 5 more grams (2 slices of whole-wheat bread, 2 servings of most fruits or vegetables, or 1 bowl of high-fiber cereal) per day is best. Too rapid an increase in fiber may result in constipation, flatulence, and bloating.   Drink enough water and fluids to keep your urine clear or pale yellow. Water, juice, or caffeine-free drinks are recommended. Not drinking enough fluid may cause constipation.   Eat a variety of high-fiber foods rather than one type of fiber.   Try to increase your intake of fiber through using high-fiber foods rather than fiber pills or supplements that contain small amounts of fiber.   The goal is to change the types of food eaten. Do not supplement your present diet with high-fiber foods, but replace foods in your present diet.  INCLUDE A VARIETY OF FIBER SOURCES  Replace refined and processed grains with whole grains, canned fruits with fresh fruits, and incorporate other fiber sources. White rice, white breads, and most bakery goods contain little or no fiber.   Brown whole-grain rice, buckwheat oats, and many fruits and vegetables are all good sources of fiber. These include: broccoli, Brussels sprouts, cabbage, cauliflower, beets, sweet potatoes, white potatoes (skin on), carrots, tomatoes, eggplant, squash, berries, fresh fruits, and dried fruits.   Cereals appear to be the richest source of fiber. Cereal fiber is found in whole grains and bran. Bran is the fiber-rich outer coat of cereal grain, which is largely removed in refining. In whole-grain cereals, the bran remains. In breakfast cereals, the largest amount of fiber is found in those with "bran" in their names. The fiber content is sometimes indicated on the label.   You may need to include additional fruits and vegetables each day.   In baking, for 1 cup white flour, you may use the following substitutions:   1 cup whole-wheat flour minus 2 tablespoons.   1/2 cup white flour plus 1/2 cup  whole-wheat flour.   Polyps, Colon  A polyp is extra tissue that grows inside your body. Colon polyps grow in the large intestine. The large intestine, also called the colon, is part of your digestive system. It is a long, hollow tube at the end of your digestive tract where your body makes and stores stool. Most polyps are not dangerous. They are benign. This means they are not cancerous. But over time, some types of polyps can turn into cancer. Polyps that are smaller than a pea are usually not harmful. But larger polyps could someday become or may already be cancerous. To be safe, doctors remove all polyps and test them.   WHO GETS POLYPS? Anyone can get polyps, but certain people are more likely than others. You may have a greater chance of getting polyps if:  You are over 50.   You have had polyps before.   Someone in your family has had polyps.   Someone in your family has had cancer of  the large intestine.   Find out if someone in your family has had polyps. You may also be more likely to get polyps if you:   Eat a lot of fatty foods   Smoke   Drink alcohol   Do not exercise  Eat too much   TREATMENT  The caregiver will remove the polyp during sigmoidoscopy or colonoscopy.    PREVENTION There is not one sure way to prevent polyps. You might be able to lower your risk of getting them if you:  Eat more fruits and vegetables and less fatty food.   Do not smoke.   Avoid alcohol.   Exercise every day.   Lose weight if you are overweight.   Eating more calcium and folate can also lower your risk of getting polyps. Some foods that are rich in calcium are milk, cheese, and broccoli. Some foods that are rich in folate are chickpeas, kidney beans, and spinach.   Hemorrhoids Hemorrhoids are dilated (enlarged) veins around the rectum. Sometimes clots will form in the veins. This makes them swollen and painful. These are called thrombosed hemorrhoids. Causes of hemorrhoids  include:  Constipation.   Straining to have a bowel movement.   HEAVY LIFTING HOME CARE INSTRUCTIONS  Eat a well balanced diet and drink 6 to 8 glasses of water every day to avoid constipation. You may also use a bulk laxative.   Avoid straining to have bowel movements.   Keep anal area dry and clean.   Do not use a donut shaped pillow or sit on the toilet for long periods. This increases blood pooling and pain.   Move your bowels when your body has the urge; this will require less straining and will decrease pain and pressure.

## 2013-12-03 NOTE — Op Note (Signed)
[facility] [address] [place], [address]   COLONOSCOPY PROCEDURE REPORT  PATIENT: Joanna Brown, Joanna Brown  MR#: [id number] BIRTHDATE: 08-Nov-1940 , 72  yrs. old GENDER: Female ENDOSCOPIST: Barney Drain, MD REFERRED VQ:[id number] Moshe Cipro, M.D. PROCEDURE DATE:  12/02/2013 PROCEDURE:   Colonoscopy with cold biopsy polypectomy INDICATIONS:Average risk patient for colon cancer. MEDICATIONS: Demerol 50 mg IV and Versed 6 mg IV  DESCRIPTION OF PROCEDURE:    Physical exam was performed.  Informed consent was obtained from the patient after explaining the benefits, risks, and alternatives to procedure.  The patient was connected to monitor and placed in left lateral position. Continuous oxygen was provided by nasal cannula and IV medicine administered through an indwelling cannula.  After administration of sedation and rectal exam, the patients rectum was intubated and the EC-[id number] ([id number])  colonoscope was advanced under direct visualization to the cecum.  The scope was removed slowly by carefully examining the color, texture, anatomy, and integrity mucosa on the way out.  The patient was recovered in endoscopy and discharged home in satisfactory condition.    COLON FINDINGS: Three sessile polyps measuring 3-4 mm in size were found in the descending colon and sigmoid colon.  A polypectomy was performed with cold forceps.  , The LEFT colon IS Redundant. Manual abdominal counter-pressure was used to reach the cecum.  The patient was moved on to their back to reach the cecum, and Small internal hemorrhoids were found.  PREP QUALITY: good.  CECAL W/D TIME: 17 minutes     COMPLICATIONS: None  ENDOSCOPIC IMPRESSION: 1.   Three COLON POLYPS REMOVED 2.   The [place]/TC colon ARE as redundant 3.   Small internal hemorrhoids  RECOMMENDATIONS: HOLD ASPIRIN FOR 3 DAYS. DRINK WATER TO KEEP YOUR URINE LIGHT YELLOW. FOLLOW A HIGH FIBER DIET.  AVOID ITEMS THAT CAUSE BLOATING &  GAS. USE MIRALAX OR DULCOLAX AS NEEDED TO HAVE A BM. BIOPSY RESULTS SHOULD BE BACK IN 7 DAYS.  Next colonoscopy in 5-10 YEARS WITH AN OVERTUBE.       _______________________________ Lorrin MaisBarney Drain, MD 12/03/2013 7:38 AM

## 2013-12-04 ENCOUNTER — Encounter (HOSPITAL_COMMUNITY): Payer: Self-pay | Admitting: Gastroenterology

## 2013-12-11 ENCOUNTER — Encounter (INDEPENDENT_AMBULATORY_CARE_PROVIDER_SITE_OTHER): Payer: Self-pay

## 2013-12-11 ENCOUNTER — Telehealth: Payer: Self-pay | Admitting: Gastroenterology

## 2013-12-11 ENCOUNTER — Ambulatory Visit (INDEPENDENT_AMBULATORY_CARE_PROVIDER_SITE_OTHER): Payer: Medicare Other | Admitting: Family Medicine

## 2013-12-11 ENCOUNTER — Encounter: Payer: Self-pay | Admitting: Family Medicine

## 2013-12-11 VITALS — BP 122/64 | HR 96 | Resp 18 | Wt 172.1 lb

## 2013-12-11 DIAGNOSIS — E1129 Type 2 diabetes mellitus with other diabetic kidney complication: Secondary | ICD-10-CM | POA: Diagnosis not present

## 2013-12-11 DIAGNOSIS — N189 Chronic kidney disease, unspecified: Secondary | ICD-10-CM

## 2013-12-11 DIAGNOSIS — Z23 Encounter for immunization: Secondary | ICD-10-CM | POA: Insufficient documentation

## 2013-12-11 DIAGNOSIS — E785 Hyperlipidemia, unspecified: Secondary | ICD-10-CM | POA: Diagnosis not present

## 2013-12-11 DIAGNOSIS — E1122 Type 2 diabetes mellitus with diabetic chronic kidney disease: Secondary | ICD-10-CM

## 2013-12-11 DIAGNOSIS — I10 Essential (primary) hypertension: Secondary | ICD-10-CM | POA: Diagnosis not present

## 2013-12-11 DIAGNOSIS — E663 Overweight: Secondary | ICD-10-CM | POA: Diagnosis not present

## 2013-12-11 NOTE — Progress Notes (Signed)
   Subjective:    Patient ID: Joanna Brown, female    DOB: 17-Mar-1941, 73 y.o.   MRN: [id number]  HPI  The PT is here for follow up and re-evaluation of chronic medical conditions,uncontrolled hypertension and diabetes and hyperlipidemia, medication management and review of any available recent lab and radiology data.  Preventive health is updated, specifically  Cancer screening and Immunization.   Questions or concerns regarding consultations or procedures which the PT has had in the interim are  addressed. The PT denies any adverse reactions to current medications since the last visit.  There are no new concerns. Does state however that her blood sugar levels are higher than she would want them generally fasting sugar is around 150 to 160, does report dietary indiscretion , she continues to exercise regulalrly There are no specific complaints      Review of Systems See HPI Denies recent fever or chills. Denies sinus pressure, nasal congestion, ear pain or sore throat. Denies chest congestion, productive cough or wheezing. Denies chest pains, palpitations and leg swelling Denies abdominal pain, nausea, vomiting,diarrhea or constipation.   Denies dysuria, frequency, hesitancy or incontinence. Denies joint pain, swelling and limitation in mobility. Denies headaches, seizures, numbness, or tingling. Denies depression, anxiety or insomnia. Denies skin break down or rash.        Objective:   Physical Exam BP 122/64  Pulse 96  Resp 18  Wt 172 lb 1.9 oz (78.073 kg)  SpO2 98% Patient alert and oriented and in no cardiopulmonary distress.  HEENT: No facial asymmetry, EOMI,   oropharynx pink and moist.  Neck supple no JVD, no mass.  Chest: Clear to auscultation bilaterally.  CVS: S1, S2 no murmurs, no S3.Regular rate.  ABD: Soft non tender.   Ext: No edema  MS: Adequate ROM spine, shoulders, hips and knees.  Skin: Intact, no ulcerations or rash noted.  Psych: Good eye  contact, normal affect. Memory intact not anxious or depressed appearing.  CNS: CN 2-12 intact, power,  normal throughout.no focal deficits noted.        Assessment & Plan:  Hypertension goal BP (blood pressure) < 130/80 Controlled, no change in medication   Type 2 diabetes mellitus with chronic kidney disease Patient advised to reduce carb and sweets, commit to regular physical activity, take meds as prescribed, test blood as directed, and attempt to lose weight, to improve blood sugar control. Updated lab needed and reviewed, control has deteriorated, medications adjustment is made Re[pt in 3 to 4 month   Hyperlipidemia LDL goal <100 Hyperlipidemia:Low fat diet discussed and encouraged.  Updated lab reviewed, lipids have improved  But are still not at goal, no med change tighter control of diet   Need for vaccination with 13-polyvalent pneumococcal conjugate vaccine Vaccine adminisrtered  Overweight (BMI 25.0-29.9) Unchanged. Patient re-educated about  the importance of commitment to a  minimum of 150 minutes of exercise per week. The importance of healthy food choices with portion control discussed. Encouraged to start a food diary, count calories and to consider  joining a support group. Sample diet sheets offered. Goals set by the patient for the next several months.

## 2013-12-11 NOTE — Assessment & Plan Note (Signed)
Controlled, no change in medication  

## 2013-12-11 NOTE — Patient Instructions (Addendum)
Annual wellness visit in early November, call if you need me before  Prevnar today  Blood pressure is excellent continue medication  Fasting lipid, cmp and EGFR and HBa1C tomorrow please  It is important that you exercise regularly at least 30 minutes 5 times a week. If you develop chest pain, have severe difficulty breathing, or feel very tired, stop exercising immediately and seek medical attention

## 2013-12-11 NOTE — Assessment & Plan Note (Addendum)
Patient advised to reduce carb and sweets, commit to regular physical activity, take meds as prescribed, test blood as directed, and attempt to lose weight, to improve blood sugar control. Updated lab needed and reviewed, control has deteriorated, medications adjustment is made Re[pt in 3 to 4 month

## 2013-12-11 NOTE — Assessment & Plan Note (Signed)
Vaccine adminisrtered

## 2013-12-11 NOTE — Telephone Encounter (Signed)
Please call pt. He had HYPERPLASTIC POLYPS removed.   DRINK WATER TO KEEP YOUR URINE LIGHT YELLOW.  FOLLOW A HIGH FIBER DIET. AVOID ITEMS THAT CAUSE BLOATING & GAS.  USE MIRALAX OR DULCOLAX AS NEEDED TO HAVE A BOWEL MOVEMENT 1 OR 2 TIMES A WEEK.  Next colonoscopy in 10 YEARS.

## 2013-12-11 NOTE — Assessment & Plan Note (Addendum)
Hyperlipidemia:Low fat diet discussed and encouraged.  Updated lab reviewed, lipids have improved  But are still not at goal, no med change tighter control of diet

## 2013-12-12 LAB — LIPID PANEL
Cholesterol: 191 mg/dL (ref 0–200)
HDL: 36 mg/dL — ABNORMAL LOW (ref >39)
LDL Cholesterol: 105 mg/dL — ABNORMAL HIGH (ref 0–99)
Total CHOL/HDL Ratio: 5.3 Ratio
Triglycerides: 252 mg/dL — ABNORMAL HIGH (ref <150)
VLDL: 50 mg/dL — ABNORMAL HIGH (ref 0–40)

## 2013-12-12 LAB — COMPLETE METABOLIC PANEL WITH GFR
ALT: 38 U/L — ABNORMAL HIGH (ref 0–35)
AST: 35 U/L (ref 0–37)
Albumin: 4.2 g/dL (ref 3.5–5.2)
Alkaline Phosphatase: 45 U/L (ref 39–117)
BUN: 20 mg/dL (ref 6–23)
CO2: 26 mEq/L (ref 19–32)
Calcium: 10 mg/dL (ref 8.4–10.5)
Chloride: 98 mEq/L (ref 96–112)
Creat: 1.2 mg/dL — ABNORMAL HIGH (ref 0.50–1.10)
GFR, Est African American: 53 mL/min — ABNORMAL LOW
GFR, Est Non African American: 46 mL/min — ABNORMAL LOW
Glucose, Bld: 189 mg/dL — ABNORMAL HIGH (ref 70–99)
Potassium: 5 mEq/L (ref 3.5–5.3)
Sodium: 134 mEq/L — ABNORMAL LOW (ref 135–145)
Total Bilirubin: 0.4 mg/dL (ref 0.2–1.2)
Total Protein: 7.3 g/dL (ref 6.0–8.3)

## 2013-12-12 LAB — HEMOGLOBIN A1C
Hgb A1c MFr Bld: 8.6 % — ABNORMAL HIGH (ref <5.7)
Mean Plasma Glucose: 200 mg/dL — ABNORMAL HIGH (ref <117)

## 2013-12-12 NOTE — Telephone Encounter (Signed)
LMOM to call.

## 2013-12-12 NOTE — Telephone Encounter (Signed)
cc'd to pcp 

## 2013-12-16 ENCOUNTER — Other Ambulatory Visit: Payer: Self-pay | Admitting: Family Medicine

## 2013-12-16 MED ORDER — PRAVASTATIN SODIUM 80 MG PO TABS: 80.0000 mg | ORAL_TABLET | Freq: Every day | ORAL | Status: DC

## 2013-12-16 MED ORDER — INSULIN GLARGINE 100 UNIT/ML SOLOSTAR PEN: 25.0000 [IU] | PEN_INJECTOR | Freq: Every day | SUBCUTANEOUS | Status: DC

## 2013-12-16 NOTE — Telephone Encounter (Signed)
Called and informed pt.  

## 2013-12-16 NOTE — Progress Notes (Signed)
See next telephone message. 

## 2013-12-17 DIAGNOSIS — L299 Pruritus, unspecified: Secondary | ICD-10-CM | POA: Diagnosis not present

## 2013-12-17 DIAGNOSIS — L819 Disorder of pigmentation, unspecified: Secondary | ICD-10-CM | POA: Diagnosis not present

## 2014-01-08 DIAGNOSIS — H4011X0 Primary open-angle glaucoma, stage unspecified: Secondary | ICD-10-CM | POA: Diagnosis not present

## 2014-01-08 DIAGNOSIS — H409 Unspecified glaucoma: Secondary | ICD-10-CM | POA: Diagnosis not present

## 2014-01-09 ENCOUNTER — Encounter: Payer: Self-pay | Admitting: Family Medicine

## 2014-01-14 ENCOUNTER — Ambulatory Visit (HOSPITAL_COMMUNITY)
Admission: RE | Admit: 2014-01-14 | Discharge: 2014-01-14 | Disposition: A | Discharge status: Home / Self Care | Payer: Medicare Other | Source: Ambulatory Visit | Attending: Family Medicine | Admitting: Family Medicine

## 2014-01-14 DIAGNOSIS — Z13820 Encounter for screening for osteoporosis: Secondary | ICD-10-CM | POA: Diagnosis not present

## 2014-01-14 DIAGNOSIS — Z780 Asymptomatic menopausal state: Secondary | ICD-10-CM | POA: Diagnosis not present

## 2014-01-17 ENCOUNTER — Other Ambulatory Visit: Payer: Self-pay | Admitting: Family Medicine

## 2014-01-29 NOTE — Assessment & Plan Note (Signed)
Unchanged. Patient re-educated about  the importance of commitment to a  minimum of 150 minutes of exercise per week. The importance of healthy food choices with portion control discussed. Encouraged to start a food diary, count calories and to consider  joining a support group. Sample diet sheets offered. Goals set by the patient for the next several months.    

## 2014-02-18 DIAGNOSIS — E1142 Type 2 diabetes mellitus with diabetic polyneuropathy: Secondary | ICD-10-CM | POA: Diagnosis not present

## 2014-02-18 DIAGNOSIS — B351 Tinea unguium: Secondary | ICD-10-CM | POA: Diagnosis not present

## 2014-02-20 ENCOUNTER — Other Ambulatory Visit: Payer: Self-pay | Admitting: Family Medicine

## 2014-02-22 ENCOUNTER — Other Ambulatory Visit: Payer: Self-pay | Admitting: Family Medicine

## 2014-02-26 DIAGNOSIS — E119 Type 2 diabetes mellitus without complications: Secondary | ICD-10-CM | POA: Diagnosis not present

## 2014-02-26 DIAGNOSIS — H26493 Other secondary cataract, bilateral: Secondary | ICD-10-CM | POA: Diagnosis not present

## 2014-02-26 DIAGNOSIS — H4011X1 Primary open-angle glaucoma, mild stage: Secondary | ICD-10-CM | POA: Diagnosis not present

## 2014-02-26 LAB — HM DIABETES EYE EXAM

## 2014-03-03 DIAGNOSIS — E1129 Type 2 diabetes mellitus with other diabetic kidney complication: Secondary | ICD-10-CM | POA: Diagnosis not present

## 2014-03-03 DIAGNOSIS — E785 Hyperlipidemia, unspecified: Secondary | ICD-10-CM | POA: Diagnosis not present

## 2014-03-03 DIAGNOSIS — I10 Essential (primary) hypertension: Secondary | ICD-10-CM | POA: Diagnosis not present

## 2014-03-03 DIAGNOSIS — N189 Chronic kidney disease, unspecified: Secondary | ICD-10-CM | POA: Diagnosis not present

## 2014-03-03 LAB — LIPID PANEL
Cholesterol: 163 mg/dL (ref 0–200)
HDL: 36 mg/dL — ABNORMAL LOW (ref >39)
LDL Cholesterol: 79 mg/dL (ref 0–99)
Total CHOL/HDL Ratio: 4.5 Ratio
Triglycerides: 240 mg/dL — ABNORMAL HIGH (ref <150)
VLDL: 48 mg/dL — ABNORMAL HIGH (ref 0–40)

## 2014-03-03 LAB — COMPLETE METABOLIC PANEL WITH GFR
ALT: 20 U/L (ref 0–35)
AST: 21 U/L (ref 0–37)
Albumin: 4.1 g/dL (ref 3.5–5.2)
Alkaline Phosphatase: 33 U/L — ABNORMAL LOW (ref 39–117)
BUN: 18 mg/dL (ref 6–23)
CO2: 29 mEq/L (ref 19–32)
Calcium: 9.8 mg/dL (ref 8.4–10.5)
Chloride: 101 mEq/L (ref 96–112)
Creat: 1.34 mg/dL — ABNORMAL HIGH (ref 0.50–1.10)
GFR, Est African American: 46 mL/min — ABNORMAL LOW
GFR, Est Non African American: 40 mL/min — ABNORMAL LOW
Glucose, Bld: 111 mg/dL — ABNORMAL HIGH (ref 70–99)
Potassium: 4.7 mEq/L (ref 3.5–5.3)
Sodium: 135 mEq/L (ref 135–145)
Total Bilirubin: 0.4 mg/dL (ref 0.2–1.2)
Total Protein: 7 g/dL (ref 6.0–8.3)

## 2014-03-03 LAB — HEMOGLOBIN A1C
Hgb A1c MFr Bld: 7.6 % — ABNORMAL HIGH (ref <5.7)
Mean Plasma Glucose: 171 mg/dL — ABNORMAL HIGH (ref <117)

## 2014-03-20 ENCOUNTER — Other Ambulatory Visit: Payer: Self-pay | Admitting: Family Medicine

## 2014-03-24 ENCOUNTER — Encounter: Payer: Self-pay | Admitting: Family Medicine

## 2014-03-24 ENCOUNTER — Encounter (INDEPENDENT_AMBULATORY_CARE_PROVIDER_SITE_OTHER): Payer: Self-pay

## 2014-03-24 ENCOUNTER — Ambulatory Visit (INDEPENDENT_AMBULATORY_CARE_PROVIDER_SITE_OTHER): Payer: Medicare Other | Admitting: Family Medicine

## 2014-03-24 VITALS — BP 120/70 | HR 80 | Resp 16 | Ht 65.0 in | Wt 176.0 lb

## 2014-03-24 DIAGNOSIS — E785 Hyperlipidemia, unspecified: Secondary | ICD-10-CM

## 2014-03-24 DIAGNOSIS — E1122 Type 2 diabetes mellitus with diabetic chronic kidney disease: Secondary | ICD-10-CM | POA: Diagnosis not present

## 2014-03-24 DIAGNOSIS — I10 Essential (primary) hypertension: Secondary | ICD-10-CM

## 2014-03-24 DIAGNOSIS — N189 Chronic kidney disease, unspecified: Secondary | ICD-10-CM | POA: Diagnosis not present

## 2014-03-24 DIAGNOSIS — Z0000 Encounter for general adult medical examination without abnormal findings: Secondary | ICD-10-CM | POA: Diagnosis not present

## 2014-03-24 DIAGNOSIS — E1129 Type 2 diabetes mellitus with other diabetic kidney complication: Secondary | ICD-10-CM | POA: Diagnosis not present

## 2014-03-24 MED ORDER — METFORMIN HCL 1000 MG PO TABS: ORAL_TABLET | ORAL | Status: DC

## 2014-03-24 MED ORDER — TRIAMTERENE-HCTZ 75-50 MG PO TABS: 1.0000 | ORAL_TABLET | Freq: Every day | ORAL | Status: DC

## 2014-03-24 MED ORDER — AMLODIPINE BESYLATE 10 MG PO TABS: ORAL_TABLET | ORAL | Status: DC

## 2014-03-24 MED ORDER — BENAZEPRIL HCL 40 MG PO TABS: ORAL_TABLET | ORAL | Status: DC

## 2014-03-24 NOTE — Assessment & Plan Note (Signed)

## 2014-03-24 NOTE — Patient Instructions (Addendum)
F/u in 3.5 month, call if you need me before  Congrats on much improved blood sugar and excellent BP, increase lantus to 20 units daily, since now doing 18 units  Reduce fried foods and cheese triglycerides are high   Please use only tylenol (OTC, up to 2 per day) for pain  Kidney function not as good as we would like, but wjth good blood pressure and blood sugar control this will improve  Microalb today from office   If you decide to get flu vaccine, just come in , no appt needed, Monday through Thursday  Continue to think about end of life decisions as  We discussed  Try to get into an exercise class   Fasting lipid, cmp and EGFr, and hBa1C in 3.5 month, and TSH  Fall Prevention and Home Safety Falls cause injuries and can affect all age groups. It is possible to prevent falls.  HOW TO PREVENT FALLS  Wear shoes with rubber soles that do not have an opening for your toes.  Keep the inside and outside of your house well lit.  Use night lights throughout your home.  Remove clutter from floors.  Clean up floor spills.  Remove throw rugs or fasten them to the floor with carpet tape.  Do not place electrical cords across pathways.  Put grab bars by your tub, shower, and toilet. Do not use towel bars as grab bars.  Put handrails on both sides of the stairway. Fix loose handrails.  Do not climb on stools or stepladders, if possible.  Do not wax your floors.  Repair uneven or unsafe sidewalks, walkways, or stairs.  Keep items you use a lot within reach.  Be aware of pets.  Keep emergency numbers next to the telephone.  Put smoke detectors in your home and near bedrooms. Ask your doctor what other things you can do to prevent falls. Document Released: 02/26/2009 Document Revised: 11/01/2011 Document Reviewed: 08/02/2011 [facility] Patient Information 2015 [place], [place]. This information is not intended to replace advice given to you by your health care provider. Make  sure you discuss any questions you have with your health care provider.

## 2014-03-24 NOTE — Progress Notes (Signed)
Subjective:    Patient ID: Joanna Brown, female    DOB: 07/12/1940, 73 y.o.   MRN: [id number]  HPI Preventive Screening-Counseling & Management   Patient present here today for a Medicare annual wellness visit. Chronic conditions reviewed as updated labs are available and med adjustments made as needed  Refuses flu vaccine   Current Problems (verified)   Medications Prior to Visit Allergies (verified)   PAST HISTORY  Family History (verified)   Social History Married for 78 years, 1 child living and 1 deceased. Retired for postal service   Risk Factors  Current exercise habits:  Walks everyday for 1 hour   Dietary issues discussed: Heart healthy diet discussed, eating more fruits and vegetables, limit fried/fatty foods and red meat.   Cardiac risk factors: Mother had heart disease diagnosed at age 56, pt has diabetes and htn  Depression Screen  (Note: if answer to either of the following is "Yes", a more complete depression screening is indicated)   Over the past two weeks, have you felt down, depressed or hopeless? No  Over the past two weeks, have you felt little interest or pleasure in doing things? No  Have you lost interest or pleasure in daily life? No  Do you often feel hopeless? No  Do you cry easily over simple problems? No   Activities of Daily Living  In your present state of health, do you have any difficulty performing the following activities?  Driving?: No Managing money?: No Feeding yourself?:No Getting from bed to chair?:No Climbing a flight of stairs?:No Preparing food and eating?:No Bathing or showering?:No Getting dressed?:No Getting to the toilet?:No Using the toilet?:No Moving around from place to place?: No  Fall Risk Assessment In the past year have you fallen or had a near fall?:No Are you currently taking any medications that make you dizzy?: sometimes   Hearing Difficulties: No Do you often ask people to speak up or repeat  themselves?:No Do you experience ringing or noises in your ears?:No Do you have difficulty understanding soft or whispered voices?:No  Cognitive Testing  Alert? Yes Normal Appearance?Yes  Oriented to person? Yes Place? Yes  Time? Yes  Displays appropriate judgment?Yes  Can read the correct time from a watch face? yes Are you having problems remembering things? Sometimes she is forgetful   Advanced Directives have been discussed with the patient?Yes, brochure given but hasn't worked on getting a living will as of yet , full code   List the Names of Other Physician/Practitioners you currently use:  Dr [name] (Cardiolody)  Dr Gershon Crane (eye Dr)   Joanna Brown any recent Medical Services you may have received from other than [facility] providers in the past year (date may be approximate).   Assessment:    Annual Wellness Exam   Plan:    Medicare Attestation  I have personally reviewed:  The patient's medical and social history  Their use of alcohol, tobacco or illicit drugs  Their current medications and supplements  The patient's functional ability including ADLs,fall risks, home safety risks, cognitive, and hearing and visual impairment  Diet and physical activities  Evidence for depression or mood disorders  The patient's weight, height, BMI, and visual acuity have been recorded in the chart. I have made referrals, counseling, and provided education to the patient based on review of the above and I have provided the patient with a written personalized care plan for preventive services.      Review of Systems     Objective:  Physical Exam BP 120/70 mmHg  Pulse 80  Resp 16  Ht 5\' 5"  (1.651 m)  Wt 176 lb (79.833 kg)  BMI 29.29 kg/m2  SpO2 98%        Assessment & Plan:  Medicare annual wellness visit, subsequent Annual exam as documented. Counseling done  re healthy lifestyle involving commitment to 150 minutes exercise per week, heart healthy diet, and attaining healthy  weight.The importance of adequate sleep also discussed. Regular seat belt use and home safety, is also discussed. Changes in health habits are decided on by the patient with goals and time frames  set for achieving them. Immunization and cancer screening needs are specifically addressed at this visit.   Hyperlipidemia LDL goal <100 Improved though still uncontrolled, tG are elevated. No change in dose of statin at this time Hyperlipidemia:Low fat diet discussed and encouraged.    Type 2 diabetes mellitus with chronic Brown disease Improved though still not at goal. Dose incrrease in insulin  Patient advised to reduce carb and sweets, commit to regular physical activity, take meds as prescribed, test blood as directed, and attempt to lose weight, to improve blood sugar control. Updated lab needed at/ before next visit.   Hypertension goal BP (blood pressure) < 130/80 Controlled, no change in medication DASH diet and commitment to daily physical activity for a minimum of 30 minutes discussed and encouraged, as a part of hypertension management. The importance of attaining a healthy weight is also discussed.

## 2014-03-25 LAB — MICROALBUMIN / CREATININE URINE RATIO
Creatinine, Urine: 17.5 mg/dL
Microalb Creat Ratio: 17.1 mg/g (ref 0.0–30.0)
Microalb, Ur: 0.3 mg/dL (ref <2.0)

## 2014-04-23 ENCOUNTER — Other Ambulatory Visit: Payer: Self-pay | Admitting: Family Medicine

## 2014-04-29 DIAGNOSIS — B351 Tinea unguium: Secondary | ICD-10-CM | POA: Diagnosis not present

## 2014-04-29 DIAGNOSIS — E1142 Type 2 diabetes mellitus with diabetic polyneuropathy: Secondary | ICD-10-CM | POA: Diagnosis not present

## 2014-05-02 ENCOUNTER — Telehealth: Payer: Self-pay | Admitting: Family Medicine

## 2014-05-06 NOTE — Telephone Encounter (Signed)
Noted that meds have been refilled.

## 2014-05-26 NOTE — Assessment & Plan Note (Addendum)
Improved though still uncontrolled, tG are elevated. No change in dose of statin at this time Hyperlipidemia:Low fat diet discussed and encouraged.

## 2014-05-26 NOTE — Assessment & Plan Note (Signed)
Controlled, no change in medication DASH diet and commitment to daily physical activity for a minimum of 30 minutes discussed and encouraged, as a part of hypertension management. The importance of attaining a healthy weight is also discussed.  

## 2014-05-26 NOTE — Assessment & Plan Note (Signed)
Improved though still not at goal. Dose incrrease in insulin  Patient advised to reduce carb and sweets, commit to regular physical activity, take meds as prescribed, test blood as directed, and attempt to lose weight, to improve blood sugar control. Updated lab needed at/ before next visit.

## 2014-05-27 DIAGNOSIS — H4011X1 Primary open-angle glaucoma, mild stage: Secondary | ICD-10-CM | POA: Diagnosis not present

## 2014-05-28 ENCOUNTER — Other Ambulatory Visit: Payer: Self-pay

## 2014-05-28 MED ORDER — INSULIN PEN NEEDLE 31G X 8 MM MISC: Status: DC

## 2014-07-11 ENCOUNTER — Encounter (HOSPITAL_COMMUNITY): Payer: Self-pay | Admitting: *Deleted

## 2014-07-11 ENCOUNTER — Emergency Department (HOSPITAL_COMMUNITY)
Admission: EM | Admit: 2014-07-11 | Discharge: 2014-07-12 | Disposition: A | Discharge status: Home / Self Care | Payer: Medicare Other | Attending: Emergency Medicine | Admitting: Emergency Medicine

## 2014-07-11 DIAGNOSIS — Z87448 Personal history of other diseases of urinary system: Secondary | ICD-10-CM | POA: Diagnosis not present

## 2014-07-11 DIAGNOSIS — Z79899 Other long term (current) drug therapy: Secondary | ICD-10-CM | POA: Diagnosis not present

## 2014-07-11 DIAGNOSIS — K0889 Other specified disorders of teeth and supporting structures: Secondary | ICD-10-CM

## 2014-07-11 DIAGNOSIS — H9201 Otalgia, right ear: Secondary | ICD-10-CM | POA: Diagnosis present

## 2014-07-11 DIAGNOSIS — E119 Type 2 diabetes mellitus without complications: Secondary | ICD-10-CM | POA: Insufficient documentation

## 2014-07-11 DIAGNOSIS — Z7982 Long term (current) use of aspirin: Secondary | ICD-10-CM | POA: Diagnosis not present

## 2014-07-11 DIAGNOSIS — I10 Essential (primary) hypertension: Secondary | ICD-10-CM | POA: Diagnosis not present

## 2014-07-11 DIAGNOSIS — Z794 Long term (current) use of insulin: Secondary | ICD-10-CM | POA: Insufficient documentation

## 2014-07-11 DIAGNOSIS — K088 Other specified disorders of teeth and supporting structures: Secondary | ICD-10-CM | POA: Diagnosis not present

## 2014-07-11 DIAGNOSIS — Z8719 Personal history of other diseases of the digestive system: Secondary | ICD-10-CM | POA: Diagnosis not present

## 2014-07-11 MED ORDER — PENICILLIN V POTASSIUM 500 MG PO TABS: 500.0000 mg | ORAL_TABLET | Freq: Four times a day (QID) | ORAL | Status: AC

## 2014-07-11 MED ORDER — TRAMADOL HCL 50 MG PO TABS: 100.0000 mg | ORAL_TABLET | Freq: Four times a day (QID) | ORAL | Status: DC | PRN

## 2014-07-11 MED ORDER — PENICILLIN V POTASSIUM 250 MG PO TABS
500.0000 mg | ORAL_TABLET | Freq: Once | ORAL | Status: AC
  Administered 2014-07-12: 500 mg via ORAL
  Filled 2014-07-11: qty 2

## 2014-07-11 MED ORDER — ACETAMINOPHEN 325 MG PO TABS
650.0000 mg | ORAL_TABLET | Freq: Once | ORAL | Status: AC
  Administered 2014-07-12: 650 mg via ORAL
  Filled 2014-07-11: qty 2

## 2014-07-11 NOTE — ED Provider Notes (Signed)
CSN: [id number]     Arrival date & time 07/11/14  1950 History  This chart was scribed for Janice Norrie, MD by Chester Holstein, ED Scribe. This patient was seen in room APA09/APA09 and the patient's care was started at 11:22 PM.    Chief Complaint  Patient presents with  . Otalgia      Patient is a 75 y.o. female presenting with ear pain. The history is provided by the patient. No language interpreter was used.  Otalgia Associated symptoms: no ear discharge, no fever and no hearing loss    HPI Comments: Joanna Brown is a 74 y.o. female who presents to the Emergency Department complaining of pain in external right ear with onset today.The pain started anterior to the right ear.  Pt notes pain now radiates to jaw on the lower right, stating it hurts to chew. No air sensitivity. Pt notes associated facial swelling on right side. Pt has not taken any medication for releif. Pt denies similar pain in past and known injury or dental problem. Pt denies changes in hearing, drainage, fever, and body aches.    Pt has NKDA.   Pt is not a smoker and denies EtOH use.  PCP Dr Bari Mantis  Pt sees a dentist at [facility]   Past Medical History  Diagnosis Date  . Diabetes mellitus   . GERD (gastroesophageal reflux disease)   . Hyperlipidemia   . Dysrhythmia   . Blood transfusion     1980  . Hypertension     echo and stress 4/10 reports on chart, EKG ` LOV 9/12 on chart  . Female bladder prolapse   . Heart disease   . Mild aortic stenosis    Past Surgical History  Procedure Laterality Date  . Abdominal hysterectomy    . Ovary surgery      bilateral tumors removed  . Thyroidectomy    . Lymph node dissection Right 1997    under arm  . Thyroidectomy  02/2008  . Colonoscopy w/ polypectomy    . Cataract extraction Bilateral     with IOL  . Anterior and posterior repair  04/26/2011    Procedure: ANTERIOR (CYSTOCELE) AND POSTERIOR REPAIR (RECTOCELE);  Surgeon: Reece Packer,  MD;  Location: [facility];  Service: Urology;  Laterality: N/A;  . Vaginal prolapse repair  04/26/2011    Procedure: VAGINAL VAULT SUSPENSION;  Surgeon: Reece Packer, MD;  Location: [facility];  Service: Urology;  Laterality: N/A;  with Graft  10x6  . Transthoracic echocardiogram  08/2011    EF=>55%, mild conc LVH; trace MR; mild TR; mild-mod AV calcification with mild valvular AV stenosis  . Nm myocar perf wall motion  2010    dipyridamole - mild-mod in intenstiy perfusion defect in mid anterior, mid anteroseptal wall, EF 70%  . Left heart cath  09/10/2008    normal coronary arteries, normal LV systolic function, EF 51% (Dr. [name])  . Colonoscopy N/A 12/02/2013    Procedure: COLONOSCOPY;  Surgeon: Danie Binder, MD;  [facility];  Service: Endoscopy;  Laterality: N/A;  9:30 AM - rescheduled to 8:30 - Doris to notify pt  . Cholecystectomy  2009   Family History  Problem Relation Age of Onset  . Stomach cancer Mother 55  . Heart disease Mother 39    heart disease  . Heart disease Father 22    MI  . Liver cancer Sister   . Hypertension Sister   .  Hypertension Sister   . Bone cancer Sister   . Stroke Maternal Grandfather   . Heart attack Paternal Grandfather   . Hypertension Child   . Hypertension Sister    History  Substance Use Topics  . Smoking status: Never Smoker   . Smokeless tobacco: Never Used  . Alcohol Use: No     Comment: socially- none x 30 years   Lives at home Lives with spouse   OB History    No data available     Review of Systems  Constitutional: Negative for fever.  HENT: Positive for ear pain and facial swelling. Negative for ear discharge and hearing loss.   Musculoskeletal: Negative for myalgias.  All other systems reviewed and are negative.     Allergies  Spironolactone  Home Medications   Prior to Admission medications   Medication Sig Start Date End Date Taking? Authorizing Provider  amLODipine (NORVASC) 10 MG tablet TAKE 1  TABLET BY MOUTH EVERY MORNING 03/24/14   Fayrene Helper, MD  aspirin 81 MG tablet Take 81 mg by mouth every morning.     Historical Provider, MD  benazepril (LOTENSIN) 40 MG tablet TAKE 1 TABLET BY MOUTH DAILY 03/24/14   Fayrene Helper, MD  Calcium Carbonate-Vit D-Min (CALCIUM 1200) 1200-1000 MG-UNIT CHEW Chew 1 each by mouth daily.    Historical Provider, MD  ferrous sulfate 325 (65 FE) MG tablet Take 325 mg by mouth daily with breakfast.    Historical Provider, MD  Insulin Glargine (LANTUS) 100 UNIT/ML Solostar Pen Inject 25 Units into the skin daily at 10 pm. 12/16/13   Fayrene Helper, MD  Insulin Pen Needle 31G X 8 MM MISC As directed with insulin use 05/28/14   Fayrene Helper, MD  metFORMIN (GLUCOPHAGE) 1000 MG tablet TAKE 1 TABLET TWICE DAILY WITH A MEAL 03/24/14   Fayrene Helper, MD  metFORMIN (GLUCOPHAGE) 1000 MG tablet TAKE 1 TABLET BY MOUTH TWICE DAILY WITH A MEAL 04/23/14   Fayrene Helper, MD  Multiple Vitamins-Minerals (CENTRUM) tablet Take 1 tablet by mouth every morning.     Historical Provider, MD  Omega-3 Fatty Acids (FISH OIL) 1200 MG CAPS Take 1 capsule by mouth every morning.     Historical Provider, MD  pravastatin (PRAVACHOL) 80 MG tablet Take 1 tablet (80 mg total) by mouth daily. 12/16/13   Fayrene Helper, MD  Travoprost, BAK Free, (TRAVATAN) 0.004 % SOLN ophthalmic solution Place 1 drop into both eyes at bedtime.     Historical Provider, MD  triamterene-hydrochlorothiazide (MAXZIDE) 75-50 MG per tablet Take 1 tablet by mouth daily. 03/24/14 03/24/15  Fayrene Helper, MD   BP 134/75 mmHg  Pulse 80  Temp(Src) 98.7 F (37.1 C) (Oral)  Resp 20  Ht 5\' 5"  (1.651 m)  Wt 174 lb (78.926 kg)  BMI 28.96 kg/m2  SpO2 100%  Vital signs normal   Physical Exam  Constitutional: She is oriented to person, place, and time. She appears well-developed and well-nourished.  Non-toxic appearance. She does not appear ill. No distress.  HENT:  Head: Normocephalic and  atraumatic.  Right Ear: Hearing, external ear and ear canal normal. No drainage, swelling or tenderness. No foreign bodies. No middle ear effusion.  Left Ear: Hearing, tympanic membrane, external ear and ear canal normal. No drainage, swelling or tenderness. No foreign bodies.  No middle ear effusion.  Nose: Nose normal. No mucosal edema or rhinorrhea.  Mouth/Throat: Oropharynx is clear and moist and mucous membranes are  normal. No dental abscesses or uvula swelling.    Pt has a partial of her lower teeth involving her incisors bilaterally Fullness of both parotids without tenderness; otherwise no obvious facial swelling. No pain to tragal pulling No obvious cavities of any of her teeth seen. No obvious swelling of her gums.   Eyes: Conjunctivae and EOM are normal. Pupils are equal, round, and reactive to light.  Neck: Normal range of motion and full passive range of motion without pain. Neck supple.  Cardiovascular: Normal rate, regular rhythm and normal heart sounds.  Exam reveals no gallop and no friction rub.   No murmur heard. Pulmonary/Chest: Effort normal and breath sounds normal. No respiratory distress. She has no wheezes. She has no rhonchi. She has no rales. She exhibits no tenderness and no crepitus.  Abdominal: Soft. Normal appearance and bowel sounds are normal. She exhibits no distension. There is no tenderness. There is no rebound and no guarding.  Musculoskeletal: Normal range of motion. She exhibits no edema or tenderness.  Moves all extremities well.   Neurological: She is alert and oriented to person, place, and time. She has normal strength. No cranial nerve deficit.  Skin: Skin is warm, dry and intact. No rash noted. No erythema. No pallor.  Psychiatric: She has a normal mood and affect. Her speech is normal and behavior is normal. Her mood appears not anxious.  Nursing note and vitals reviewed.   ED Course  Procedures (including critical care time)  Medications   penicillin v potassium (VEETID) tablet 500 mg (500 mg Oral Given 07/12/14 0012)  acetaminophen (TYLENOL) tablet 650 mg (650 mg Oral Given 07/12/14 0012)    DIAGNOSTIC STUDIES: Oxygen Saturation is 100% on room air, normal by my interpretation.    COORDINATION OF CARE: 11:29 PM Discussed treatment plan with patient at beside, the patient agrees with the plan and has no further questions at this time.   Labs Review Results for orders placed or performed during the hospital encounter of 07/11/14  CBG monitoring, ED  Result Value Ref Range   Glucose-Capillary 149 (H) 70 - 99 mg/dL   Comment 1 Notify RN    Laboratory interpretation all normal except mild hyperglycemia  Imaging Review No results found.   EKG Interpretation None      MDM   Final diagnoses:  Toothache    New Prescriptions   PENICILLIN V POTASSIUM (VEETID) 500 MG TABLET    Take 1 tablet (500 mg total) by mouth 4 (four) times daily.   TRAMADOL (ULTRAM) 50 MG TABLET    Take 2 tablets (100 mg total) by mouth every 6 (six) hours as needed.    Plan discharge   Rolland Porter, MD, FACEP   I personally performed the services described in this documentation, which was scribed in my presence. The recorded information has been reviewed and considered.  Rolland Porter, MD, FACEP     Janice Norrie, MD 07/12/14 Laureen Abrahams

## 2014-07-11 NOTE — ED Notes (Signed)
Pain rt ear  Hurts to move jaw.  No fever, no injury.

## 2014-07-12 DIAGNOSIS — K088 Other specified disorders of teeth and supporting structures: Secondary | ICD-10-CM | POA: Diagnosis not present

## 2014-07-12 LAB — CBG MONITORING, ED: Glucose-Capillary: 149 mg/dL — ABNORMAL HIGH (ref 70–99)

## 2014-07-12 NOTE — Discharge Instructions (Signed)
Take the Pen VK until gone. Take the tramadol with acetaminophen 650 mg 4 times a day for pain. Call your dentist tomorrow to have him recheck your tooth.

## 2014-07-18 DIAGNOSIS — E785 Hyperlipidemia, unspecified: Secondary | ICD-10-CM | POA: Diagnosis not present

## 2014-07-18 DIAGNOSIS — N189 Chronic kidney disease, unspecified: Secondary | ICD-10-CM | POA: Diagnosis not present

## 2014-07-18 DIAGNOSIS — I10 Essential (primary) hypertension: Secondary | ICD-10-CM | POA: Diagnosis not present

## 2014-07-18 DIAGNOSIS — Z0000 Encounter for general adult medical examination without abnormal findings: Secondary | ICD-10-CM | POA: Diagnosis not present

## 2014-07-18 DIAGNOSIS — E1122 Type 2 diabetes mellitus with diabetic chronic kidney disease: Secondary | ICD-10-CM | POA: Diagnosis not present

## 2014-07-18 DIAGNOSIS — E1129 Type 2 diabetes mellitus with other diabetic kidney complication: Secondary | ICD-10-CM | POA: Diagnosis not present

## 2014-07-18 LAB — COMPLETE METABOLIC PANEL WITH GFR
ALT: 28 U/L (ref 0–35)
AST: 30 U/L (ref 0–37)
Albumin: 4.1 g/dL (ref 3.5–5.2)
Alkaline Phosphatase: 38 U/L — ABNORMAL LOW (ref 39–117)
BUN: 11 mg/dL (ref 6–23)
CO2: 30 mEq/L (ref 19–32)
Calcium: 9.8 mg/dL (ref 8.4–10.5)
Chloride: 100 mEq/L (ref 96–112)
Creat: 1.06 mg/dL (ref 0.50–1.10)
GFR, Est African American: 61 mL/min
GFR, Est Non African American: 53 mL/min — ABNORMAL LOW
Glucose, Bld: 213 mg/dL — ABNORMAL HIGH (ref 70–99)
Potassium: 4.4 mEq/L (ref 3.5–5.3)
Sodium: 139 mEq/L (ref 135–145)
Total Bilirubin: 0.4 mg/dL (ref 0.2–1.2)
Total Protein: 6.9 g/dL (ref 6.0–8.3)

## 2014-07-18 LAB — TSH: TSH: 3.746 u[IU]/mL (ref 0.350–4.500)

## 2014-07-18 LAB — HEMOGLOBIN A1C
Hgb A1c MFr Bld: 8 % — ABNORMAL HIGH (ref <5.7)
Mean Plasma Glucose: 183 mg/dL — ABNORMAL HIGH (ref <117)

## 2014-07-18 LAB — LIPID PANEL
Cholesterol: 162 mg/dL (ref 0–200)
HDL: 38 mg/dL — ABNORMAL LOW (ref >=46)
LDL Cholesterol: 73 mg/dL (ref 0–99)
Total CHOL/HDL Ratio: 4.3 Ratio
Triglycerides: 254 mg/dL — ABNORMAL HIGH (ref <150)
VLDL: 51 mg/dL — ABNORMAL HIGH (ref 0–40)

## 2014-07-23 ENCOUNTER — Ambulatory Visit (INDEPENDENT_AMBULATORY_CARE_PROVIDER_SITE_OTHER): Payer: Medicare Other | Admitting: Family Medicine

## 2014-07-23 ENCOUNTER — Encounter: Payer: Self-pay | Admitting: Family Medicine

## 2014-07-23 VITALS — BP 136/80 | HR 97 | Resp 16 | Ht 65.0 in | Wt 179.0 lb

## 2014-07-23 DIAGNOSIS — N189 Chronic kidney disease, unspecified: Secondary | ICD-10-CM | POA: Diagnosis not present

## 2014-07-23 DIAGNOSIS — E785 Hyperlipidemia, unspecified: Secondary | ICD-10-CM | POA: Diagnosis not present

## 2014-07-23 DIAGNOSIS — E663 Overweight: Secondary | ICD-10-CM

## 2014-07-23 DIAGNOSIS — I10 Essential (primary) hypertension: Secondary | ICD-10-CM | POA: Diagnosis not present

## 2014-07-23 DIAGNOSIS — E1122 Type 2 diabetes mellitus with diabetic chronic kidney disease: Secondary | ICD-10-CM

## 2014-07-23 MED ORDER — PRAVASTATIN SODIUM 80 MG PO TABS: 80.0000 mg | ORAL_TABLET | Freq: Every day | ORAL | Status: DC

## 2014-07-23 MED ORDER — AMLODIPINE BESYLATE 10 MG PO TABS: ORAL_TABLET | ORAL | Status: DC

## 2014-07-23 NOTE — Patient Instructions (Addendum)
ANNUAL PHYSICAL EXAM IN 3.5 MONTHS, CALL IF YOU NEED ME BEFORE  Pls reduce fried and fatty food, cheese, butter, red meat, egg yolk so fat ( triglyceride numbers) go down.  Kidney function, and blood pressure are excellent, blood sugar needs to improve, but is fairly good  Memmory is good, keep using the brain!  START Lantus 25 units daily.Call if blood sugar remains too high to spk with nurse, will change dose and send new script then  Goal for fasting blood sugar ranges from 90 to 130    It is important that you exercise regularly at least 30 minutes 5 times a week. If you develop chest pain, have severe difficulty breathing, or feel very tired, stop exercising immediately and seek medical attention  A healthy diet is rich in fruit, vegetables and whole grains. Poultry fish, nuts and beans are a healthy choice for protein rather then red meat. A low sodium diet and drinking 64 ounces of water daily is generally recommended. Oils and sweet should be limited. Carbohydrates especially for those who are diabetic or overweight, should be limited to 34-45 gram per meal. It is important to eat on a regular schedule, at least 3 times daily. Snacks should be primarily fruits, vegetables or nuts.  Thanks for choosing [facility], we consider it a privelige to serve you.

## 2014-07-27 NOTE — Assessment & Plan Note (Signed)
Controlled, no change in medication DASH diet and commitment to daily physical activity for a minimum of 30 minutes discussed and encouraged, as a part of hypertension management. The importance of attaining a healthy weight is also discussed.  

## 2014-07-27 NOTE — Assessment & Plan Note (Signed)
Deteriorated . Pt to tsake insulin as prescribed, has been taking lower dose, she will call back in 1 to 3 weeks if blood sugars remain high. Patient advised to reduce carb and sweets, commit to regular physical activity, take meds as prescribed, test blood as directed, and attempt to lose weight, to improve blood sugar control.

## 2014-07-27 NOTE — Assessment & Plan Note (Signed)
Deteriorated. Patient re-educated about  the importance of commitment to a  minimum of 150 minutes of exercise per week. The importance of healthy food choices with portion control discussed. Encouraged to start a food diary, count calories and to consider  joining a support group. Sample diet sheets offered. Goals set by the patient for the next several months.    

## 2014-07-27 NOTE — Assessment & Plan Note (Signed)
Elevated TG, needs to work on diet more, holding off on addition med at [employer] time Hyperlipidemia:Low fat diet discussed and encouraged.  Updated lab needed at/ before next visit.

## 2014-07-27 NOTE — Progress Notes (Signed)
   Subjective:    Patient ID: Joanna Brown, female    DOB: 1940-10-03, 74 y.o.   MRN: [id number]  HPI The PT is here for follow up and re-evaluation of chronic medical conditions, medication management and review of any available recent lab and radiology data.  Preventive health is updated, specifically  Cancer screening and Immunization.   Questions or concerns regarding consultations or procedures which the PT has had in the interim are  addressed. The PT denies any adverse reactions to current medications since the last visit.   Denies polyuria, polydipsia, blurred vision , or hypoglycemic episodes. C/o high morning sugars but not taking prescribed dos of insulin, taking less, also with recent holidays diet has been less than optimal    Review of Systems See HPI Denies recent fever or chills. Denies sinus pressure, nasal congestion, ear pain or sore throat. Denies chest congestion, productive cough or wheezing. Denies chest pains, palpitations and leg swelling Denies abdominal pain, nausea, vomiting,diarrhea or constipation.   Denies dysuria, frequency, hesitancy or incontinence. Denies joint pain, swelling and limitation in mobility. Denies headaches, seizures, numbness, or tingling. Denies depression, anxiety or insomnia. Denies skin break down or rash.        Objective:   Physical Exam BP 136/80 mmHg  Pulse 97  Resp 16  Ht 5\' 5"  (1.651 m)  Wt 179 lb (81.194 kg)  BMI 29.79 kg/m2  SpO2 98% Patient alert and oriented and in no cardiopulmonary distress.  HEENT: No facial asymmetry, EOMI,   oropharynx pink and moist.  Neck supple no JVD, no mass.  Chest: Clear to auscultation bilaterally.  CVS: S1, S2 no murmurs, no S3.Regular rate.  ABD: Soft non tender.   Ext: No edema  MS: Adequate ROM spine, shoulders, hips and knees.  Skin: Intact, no ulcerations or rash noted.  Psych: Good eye contact, normal affect. Memory intact not anxious or depressed  appearing.  CNS: CN 2-12 intact, power,  normal throughout.no focal deficits noted.        Assessment & Plan:  Hypertension goal BP (blood pressure) < 130/80 Controlled, no change in medication DASH diet and commitment to daily physical activity for a minimum of 30 minutes discussed and encouraged, as a part of hypertension management. The importance of attaining a healthy weight is also discussed.    Type 2 diabetes mellitus with chronic kidney disease Deteriorated . Pt to tsake insulin as prescribed, has been taking lower dose, she will call back in 1 to 3 weeks if blood sugars remain high. Patient advised to reduce carb and sweets, commit to regular physical activity, take meds as prescribed, test blood as directed, and attempt to lose weight, to improve blood sugar control.    Hyperlipidemia LDL goal <100 Elevated TG, needs to work on diet more, holding off on addition med at [employer] time Hyperlipidemia:Low fat diet discussed and encouraged.  Updated lab needed at/ before next visit.    Overweight (BMI 25.0-29.9) Deteriorated. Patient re-educated about  the importance of commitment to a  minimum of 150 minutes of exercise per week. The importance of healthy food choices with portion control discussed. Encouraged to start a food diary, count calories and to consider  joining a support group. Sample diet sheets offered. Goals set by the patient for the next several months.

## 2014-08-11 DIAGNOSIS — Z1231 Encounter for screening mammogram for malignant neoplasm of breast: Secondary | ICD-10-CM | POA: Diagnosis not present

## 2014-08-22 ENCOUNTER — Telehealth: Payer: Self-pay

## 2014-08-22 NOTE — Telephone Encounter (Signed)
Yes pls advise to increase to 30 units daily, call back in 1 week

## 2014-08-22 NOTE — Telephone Encounter (Signed)
Patient aware and will increase and call back next Friday am

## 2014-08-22 NOTE — Telephone Encounter (Signed)
Her past few readings are still elevated but are slowly getting better she states. (204, 209, 179, 169, 160) She is currently on 25 units of lantus. Does she need to increase?

## 2014-08-25 ENCOUNTER — Ambulatory Visit (INDEPENDENT_AMBULATORY_CARE_PROVIDER_SITE_OTHER): Payer: Medicare Other | Admitting: Internal Medicine

## 2014-08-25 ENCOUNTER — Encounter: Payer: Self-pay | Admitting: Internal Medicine

## 2014-08-25 VITALS — BP 122/62 | HR 75 | Ht 65.0 in | Wt 179.6 lb

## 2014-08-25 DIAGNOSIS — E663 Overweight: Secondary | ICD-10-CM | POA: Diagnosis not present

## 2014-08-25 DIAGNOSIS — E785 Hyperlipidemia, unspecified: Secondary | ICD-10-CM | POA: Diagnosis not present

## 2014-08-25 DIAGNOSIS — K219 Gastro-esophageal reflux disease without esophagitis: Secondary | ICD-10-CM

## 2014-08-25 DIAGNOSIS — I350 Nonrheumatic aortic (valve) stenosis: Secondary | ICD-10-CM | POA: Diagnosis not present

## 2014-08-25 NOTE — Patient Instructions (Signed)
Your physician has requested that you have an echocardiogram. Echocardiography is a painless test that uses sound waves to create images of your heart. It provides your doctor with information about the size and shape of your heart and how well your heart's chambers and valves are working. This procedure takes approximately one hour. There are no restrictions for this procedure.  Your physician wants you to follow-up in: 1 year with Dr. [name]. You will receive a reminder letter in the mail two months in advance. If you don't receive a letter, please call our office to schedule the follow-up appointment.  Dr. [name] recommends you try Pepcid or Zantac over the counter for reflux

## 2014-08-25 NOTE — Progress Notes (Signed)
OFFICE NOTE  Chief Complaint:  Occasional dizziness  Primary Care Physician: Tula Nakayama, MD  HPI:  Joanna Brown is a 74 year old female who has a history of mild aortic stenosis with a valve area of approximately 1.7 cm, also a history of dyslipidemia and hypertension, both of which have been well controlled. We are seeing her back for an annual visit today. She reports actually feeling very well, started walking every day, has made major changes to her diet as reflected by a marked decrease in triglycerides. Unfortunately recently she had a mild elevation in liver enzymes slightly above normal on lovastatin. Typically we could tolerate liver enzymes up to 3 times normal on statin medications, however, she was taken off the lovastatin. Either way it is questionable whether she really needs the additional statin at this time and this certainly argues that she should have further workup as to why she had elevated liver enzymes, whether this is due to a new non-alcoholic steatohepatitis or perhaps concomitant medications causing her elevated liver enzymes.  In fact, I reviewed her CT scan today he years ago and she does have steatohepatitis.  Therefore she is not a good candidate for a statin medication. Her blood pressure seems to be well-controlled on her current regimen.  I saw Joanna Brown back in the office today. She is without any new complaints. She occasionally gets some heartburn and is not currently taking medication for that. She denies any chest pain or worsening shortness of breath.  PMHx:  Past Medical History  Diagnosis Date  . Diabetes mellitus   . GERD (gastroesophageal reflux disease)   . Hyperlipidemia   . Dysrhythmia   . Blood transfusion     1980  . Hypertension     echo and stress 4/10 reports on chart, EKG ` LOV 9/12 on chart  . Female bladder prolapse   . Heart disease   . Mild aortic stenosis     Past Surgical History  Procedure Laterality Date  .  Abdominal hysterectomy    . Ovary surgery      bilateral tumors removed  . Thyroidectomy    . Lymph node dissection Right 1997    under arm  . Thyroidectomy  02/2008  . Colonoscopy w/ polypectomy    . Cataract extraction Bilateral     with IOL  . Anterior and posterior repair  04/26/2011    Procedure: ANTERIOR (CYSTOCELE) AND POSTERIOR REPAIR (RECTOCELE);  Surgeon: Reece Packer, MD;  Location: [facility];  Service: Urology;  Laterality: N/A;  . Vaginal prolapse repair  04/26/2011    Procedure: VAGINAL VAULT SUSPENSION;  Surgeon: Reece Packer, MD;  Location: [facility];  Service: Urology;  Laterality: N/A;  with Graft  10x6  . Transthoracic echocardiogram  08/2011    EF=>55%, mild conc LVH; trace MR; mild TR; mild-mod AV calcification with mild valvular AV stenosis  . Nm myocar perf wall motion  2010    dipyridamole - mild-mod in intenstiy perfusion defect in mid anterior, mid anteroseptal wall, EF 70%  . Left heart cath  09/10/2008    normal coronary arteries, normal LV systolic function, EF 27% (Dr. [name])  . Colonoscopy N/A 12/02/2013    Procedure: COLONOSCOPY;  Surgeon: Danie Binder, MD;  [facility];  Service: Endoscopy;  Laterality: N/A;  9:30 AM - rescheduled to 8:30 - Doris to notify pt  . Cholecystectomy  2009    FAMHx:  Family History  Problem Relation Age  of Onset  . Stomach cancer Mother 53  . Heart disease Mother 41    heart disease  . Heart disease Father 60    MI  . Liver cancer Sister   . Hypertension Sister   . Hypertension Sister   . Bone cancer Sister   . Stroke Maternal Grandfather   . Heart attack Paternal Grandfather   . Hypertension Child   . Hypertension Sister     SOCHx:   reports that she has never smoked. She has never used smokeless tobacco. She reports that she does not drink alcohol or use illicit drugs.  ALLERGIES:  Allergies  Allergen Reactions  . Spironolactone     Stomach problems, vision changes     ROS: A  comprehensive review of systems was negative except for: Gastrointestinal: positive for reflux symptoms Neurological: positive for dizziness  HOME MEDS: Current Outpatient Prescriptions  Medication Sig Dispense Refill  . amLODipine (NORVASC) 10 MG tablet TAKE 1 TABLET BY MOUTH EVERY MORNING 90 tablet 1  . aspirin 81 MG tablet Take 81 mg by mouth every morning.     . benazepril (LOTENSIN) 40 MG tablet TAKE 1 TABLET BY MOUTH DAILY 90 tablet 1  . Calcium Carbonate-Vit D-Min (CALCIUM 1200) 1200-1000 MG-UNIT CHEW Chew 1 each by mouth daily.    . ferrous sulfate 325 (65 FE) MG tablet Take 325 mg by mouth daily with breakfast.    . Insulin Glargine (LANTUS) 100 UNIT/ML Solostar Pen Inject 25 Units into the skin daily at 10 pm. (Patient taking differently: Inject 25 Units into the skin daily at 10 pm. Takes 20 units) 15 mL 5  . Insulin Pen Needle 31G X 8 MM MISC As directed with insulin use 90 each 0  . metFORMIN (GLUCOPHAGE) 1000 MG tablet TAKE 1 TABLET TWICE DAILY WITH A MEAL 180 tablet 1  . Multiple Vitamins-Minerals (CENTRUM) tablet Take 1 tablet by mouth every morning.     . Omega-3 Fatty Acids (FISH OIL) 1200 MG CAPS Take 1 capsule by mouth every morning.     . pravastatin (PRAVACHOL) 80 MG tablet Take 1 tablet (80 mg total) by mouth daily. 90 tablet 1  . Travoprost, BAK Free, (TRAVATAN) 0.004 % SOLN ophthalmic solution Place 1 drop into both eyes at bedtime.     . triamterene-hydrochlorothiazide (MAXZIDE) 75-50 MG per tablet Take 1 tablet by mouth daily. 90 tablet 1   No current facility-administered medications for this visit.    LABS/IMAGING: No results found for this or any previous visit (from the past 48 hour(s)). No results found.  VITALS: BP 122/62 mmHg  Pulse 75  Ht 5\' 5"  (1.651 m)  Wt 179 lb 9.6 oz (81.466 kg)  BMI 29.89 kg/m2  EXAM: General appearance: alert and no distress Neck: no carotid bruit and no JVD Lungs: clear to auscultation bilaterally Heart: regular rate  and rhythm, S1, S2 normal, no murmur, click, rub or gallop Abdomen: soft, non-tender; bowel sounds normal; no masses,  no organomegaly Extremities: extremities normal, atraumatic, no cyanosis or edema Pulses: 2+ and symmetric Skin: Skin color, texture, turgor normal. No rashes or lesions Neurologic: Grossly normal Psych: Mood, affect normal  EKG: Normal sinus rhythm at 75  ASSESSMENT: 1. Hypertension-controlled 2. Dyslipidemia 3. Mild aortic stenosis 4. NAFLD 5. GERD  PLAN: 1.   Mrs. Fake is doing well with good control of her blood pressure on her current regimen. She does have dyslipidemia and has been intolerant to statins due to increasing liver enzymes. She  does have nonalcoholic fatty liver disease by CT scan. She does have a history of mild aortic stenosis and is overdue for a repeat echocardiogram. I would recommend obtaining an echo in the next 1-2 weeks. She is also complaining of reflux symptoms and I've encouraged her to take either Pepcid or Zantac. I'll contact her with results of her echo. Otherwise plan to see her back annually or sooner as necessary.  [name], MD, [facility] Attending Cardiologist [facility]  Avea Mcgowen C 08/25/2014, 5:30 PM

## 2014-08-27 DIAGNOSIS — H4011X1 Primary open-angle glaucoma, mild stage: Secondary | ICD-10-CM | POA: Diagnosis not present

## 2014-09-04 ENCOUNTER — Encounter: Payer: Self-pay | Admitting: Family Medicine

## 2014-09-05 ENCOUNTER — Ambulatory Visit (HOSPITAL_COMMUNITY)
Admission: RE | Admit: 2014-09-05 | Discharge: 2014-09-05 | Disposition: A | Discharge status: Home / Self Care | Payer: Medicare Other | Source: Ambulatory Visit | Attending: Cardiology | Admitting: Cardiology

## 2014-09-05 DIAGNOSIS — Z794 Long term (current) use of insulin: Secondary | ICD-10-CM | POA: Diagnosis not present

## 2014-09-05 DIAGNOSIS — E785 Hyperlipidemia, unspecified: Secondary | ICD-10-CM | POA: Diagnosis not present

## 2014-09-05 DIAGNOSIS — I350 Nonrheumatic aortic (valve) stenosis: Secondary | ICD-10-CM | POA: Insufficient documentation

## 2014-09-05 DIAGNOSIS — Z8249 Family history of ischemic heart disease and other diseases of the circulatory system: Secondary | ICD-10-CM | POA: Insufficient documentation

## 2014-09-05 DIAGNOSIS — I10 Essential (primary) hypertension: Secondary | ICD-10-CM | POA: Insufficient documentation

## 2014-09-05 DIAGNOSIS — E119 Type 2 diabetes mellitus without complications: Secondary | ICD-10-CM | POA: Diagnosis not present

## 2014-09-05 NOTE — Progress Notes (Signed)
2D Echocardiogram Complete.  09/05/2014   Greenly Rarick [place], RDCS

## 2014-09-19 ENCOUNTER — Other Ambulatory Visit: Payer: Self-pay

## 2014-09-19 MED ORDER — INSULIN PEN NEEDLE 31G X 8 MM MISC: Status: DC

## 2014-11-15 ENCOUNTER — Other Ambulatory Visit: Payer: Self-pay | Admitting: Family Medicine

## 2014-11-25 ENCOUNTER — Telehealth: Payer: Self-pay | Admitting: *Deleted

## 2014-11-25 NOTE — Telephone Encounter (Signed)
Monica called from [employer] requesting diabatic testing supplies for pt and she sent over a request. [phone]

## 2014-11-25 NOTE — Telephone Encounter (Signed)
Noted. Will wait to hear from patient 1st on if she would like to utilize this company

## 2014-11-26 DIAGNOSIS — H4011X2 Primary open-angle glaucoma, moderate stage: Secondary | ICD-10-CM | POA: Diagnosis not present

## 2014-11-27 ENCOUNTER — Telehealth: Payer: Self-pay | Admitting: *Deleted

## 2014-11-27 DIAGNOSIS — E785 Hyperlipidemia, unspecified: Secondary | ICD-10-CM

## 2014-11-27 DIAGNOSIS — E1122 Type 2 diabetes mellitus with diabetic chronic kidney disease: Secondary | ICD-10-CM

## 2014-11-27 NOTE — Telephone Encounter (Signed)
Left message that lab order was sent to the lab

## 2014-11-27 NOTE — Telephone Encounter (Signed)
Pt called [facility] during lunch she has a appt 12/04/14 at 8:30, pt wants to know if she needs blood work. Please advise

## 2014-12-03 DIAGNOSIS — N189 Chronic kidney disease, unspecified: Secondary | ICD-10-CM | POA: Diagnosis not present

## 2014-12-03 DIAGNOSIS — E1122 Type 2 diabetes mellitus with diabetic chronic kidney disease: Secondary | ICD-10-CM | POA: Diagnosis not present

## 2014-12-03 DIAGNOSIS — E785 Hyperlipidemia, unspecified: Secondary | ICD-10-CM | POA: Diagnosis not present

## 2014-12-03 DIAGNOSIS — E1129 Type 2 diabetes mellitus with other diabetic kidney complication: Secondary | ICD-10-CM | POA: Diagnosis not present

## 2014-12-03 LAB — COMPLETE METABOLIC PANEL WITH GFR
ALT: 35 U/L (ref 0–35)
AST: 34 U/L (ref 0–37)
Albumin: 4.3 g/dL (ref 3.5–5.2)
Alkaline Phosphatase: 43 U/L (ref 39–117)
BUN: 15 mg/dL (ref 6–23)
CO2: 28 mEq/L (ref 19–32)
Calcium: 9.9 mg/dL (ref 8.4–10.5)
Chloride: 97 mEq/L (ref 96–112)
Creat: 0.99 mg/dL (ref 0.50–1.10)
GFR, Est African American: 66 mL/min
GFR, Est Non African American: 57 mL/min — ABNORMAL LOW
Glucose, Bld: 176 mg/dL — ABNORMAL HIGH (ref 70–99)
Potassium: 3.9 mEq/L (ref 3.5–5.3)
Sodium: 136 mEq/L (ref 135–145)
Total Bilirubin: 0.3 mg/dL (ref 0.2–1.2)
Total Protein: 7.4 g/dL (ref 6.0–8.3)

## 2014-12-03 LAB — LIPID PANEL
Cholesterol: 173 mg/dL (ref 0–200)
HDL: 37 mg/dL — ABNORMAL LOW (ref >=46)
LDL Cholesterol: 77 mg/dL (ref 0–99)
Total CHOL/HDL Ratio: 4.7 Ratio
Triglycerides: 293 mg/dL — ABNORMAL HIGH (ref <150)
VLDL: 59 mg/dL — ABNORMAL HIGH (ref 0–40)

## 2014-12-03 LAB — HEMOGLOBIN A1C
Hgb A1c MFr Bld: 8.3 % — ABNORMAL HIGH (ref <5.7)
Mean Plasma Glucose: 192 mg/dL — ABNORMAL HIGH (ref <117)

## 2014-12-04 ENCOUNTER — Other Ambulatory Visit (HOSPITAL_COMMUNITY)
Admission: RE | Admit: 2014-12-04 | Discharge: 2014-12-04 | Disposition: A | Discharge status: Home / Self Care | Payer: Medicare Other | Source: Ambulatory Visit | Attending: Family Medicine | Admitting: Family Medicine

## 2014-12-04 ENCOUNTER — Encounter: Payer: Self-pay | Admitting: Family Medicine

## 2014-12-04 ENCOUNTER — Ambulatory Visit (INDEPENDENT_AMBULATORY_CARE_PROVIDER_SITE_OTHER): Payer: Medicare Other | Admitting: Family Medicine

## 2014-12-04 VITALS — BP 124/80 | HR 84 | Resp 16 | Ht 65.0 in | Wt 181.0 lb

## 2014-12-04 DIAGNOSIS — I10 Essential (primary) hypertension: Secondary | ICD-10-CM | POA: Diagnosis not present

## 2014-12-04 DIAGNOSIS — Z01419 Encounter for gynecological examination (general) (routine) without abnormal findings: Secondary | ICD-10-CM | POA: Diagnosis not present

## 2014-12-04 DIAGNOSIS — E785 Hyperlipidemia, unspecified: Secondary | ICD-10-CM | POA: Diagnosis not present

## 2014-12-04 DIAGNOSIS — Z124 Encounter for screening for malignant neoplasm of cervix: Secondary | ICD-10-CM

## 2014-12-04 DIAGNOSIS — E1122 Type 2 diabetes mellitus with diabetic chronic kidney disease: Secondary | ICD-10-CM

## 2014-12-04 DIAGNOSIS — Z0000 Encounter for general adult medical examination without abnormal findings: Secondary | ICD-10-CM | POA: Diagnosis not present

## 2014-12-04 DIAGNOSIS — Z1211 Encounter for screening for malignant neoplasm of colon: Secondary | ICD-10-CM | POA: Diagnosis not present

## 2014-12-04 DIAGNOSIS — N189 Chronic kidney disease, unspecified: Secondary | ICD-10-CM

## 2014-12-04 LAB — POC HEMOCCULT BLD/STL (OFFICE/1-CARD/DIAGNOSTIC): Fecal Occult Blood, POC: NEGATIVE

## 2014-12-04 MED ORDER — BENAZEPRIL HCL 40 MG PO TABS: ORAL_TABLET | ORAL | Status: DC

## 2014-12-04 MED ORDER — GLIPIZIDE ER 5 MG PO TB24: 5.0000 mg | ORAL_TABLET | Freq: Every day | ORAL | Status: DC

## 2014-12-04 MED ORDER — TRIAMTERENE-HCTZ 75-50 MG PO TABS: 1.0000 | ORAL_TABLET | Freq: Every day | ORAL | Status: DC

## 2014-12-04 MED ORDER — INSULIN GLARGINE 100 UNIT/ML SOLOSTAR PEN: 40.0000 [IU] | PEN_INJECTOR | Freq: Every day | SUBCUTANEOUS | Status: DC

## 2014-12-04 NOTE — Progress Notes (Signed)
Subjective:    Patient ID: Joanna Brown, female    DOB: 1941/04/08, 74 y.o.   MRN: [id number]  HPI Patient is in for annual physical exam. Uncontrolled and worsened diabetes is addressed at this visit Recent labs,  are reviewed. Immunization is reviewed , and  updated if needed.    Review of Systems See HPI Does report poor  eating habits in the past approx 1 month     Objective:   Physical Exam  BP 124/80 mmHg  Pulse 84  Resp 16  Ht 5\' 5"  (1.651 m)  Wt 181 lb (82.101 kg)  BMI 30.12 kg/m2  SpO2 97% Pleasant well nourished female, alert and oriented x 3, in no cardio-pulmonary distress. Afebrile. HEENT No facial trauma or asymetry. Sinuses non tender.  Extra occullar muscles intact, pupils equally reactive to light. External ears normal, tympanic membranes clear. Oropharynx moist, no exudate, fairly good dentition. Neck: supple, no adenopathy,JVD or thyromegaly.No bruits.    Breast: No asymetry,no masses or lumps. No tenderness. No nipple discharge or inversion. No axillary or supraclavicular adenopathy  Cardiovascular system; Heart sounds normal,  S1 and  S2 ,no S3.  Systolic  murmur, no thrill. Apical beat not displaced Peripheral pulses normal.  Abdomen: Soft, non tender, no organomegaly or masses. No bruits. Bowel sounds normal. No guarding, tenderness or rebound.  Rectal:  Normal sphincter tone. No mass.No rectal masses.  Guaiac negative stool.  GU: External genitalia normal female genitalia , female distribution of hair. No lesions. Urethr No deformity or crepitus in al meatus normal in size, no  Prolapse, no lesions visibly  Present. Bladder non tender. Vagina pink and moist , with no visible lesions ,physiologic  discharge present . Adequate pelvic support no  cystocele or rectocele noted  Uterus absent , no adnexal masses, no adnexal tenderness.   MS: full ROM spine shoulders , hips and knees  Psych; Negative for anxiety or  depression       Assessment & Plan:   Annual physical exam Annual exam as documented. Counseling done  re healthy lifestyle involving commitment to 150 minutes exercise per week, heart healthy diet, and attaining healthy weight.The importance of adequate sleep also discussed. Regular seat belt use and home safety, is also discussed. Changes in health habits are decided on by the patient with goals and time frames  set for achieving them. Immunization and cancer screening needs are specifically addressed at this visit.   Type 2 diabetes mellitus with chronic kidney disease Uncontrolled, increase insulin dose, add glipizide , refer diabetic ed, initiate weekly calls Pt re educated as far as diet , and need to commit to daily exercise for approximately 10 minutes Given written guidelines for blood sugar goals also F/u in 12 weeks with rept labs  Encounter for preventive health examination Annual preventive health exam as documented. Counseling done  re healthy lifestyle involving commitment to 150 minutes exercise per week, heart healthy diet, and attaining healthy weight.The importance of adequate sleep also discussed. Regular seat belt use and home safety, is also discussed. Changes in health habits are decided on by the patient with goals and time frames  set for achieving them. Immunization and cancer screening needs are specifically addressed at this visit.   Hyperlipidemia LDL goal <100 Uncontrolled, elevated Tg and low HDL, lifestyle modification needed, no med change Hyperlipidemia:Low fat diet discussed and encouraged.   Lipid Panel  Lab Results  Component Value Date   CHOL 173 12/03/2014   HDL 37*  12/03/2014   LDLCALC 77 12/03/2014   TRIG 293* 12/03/2014   CHOLHDL 4.7 12/03/2014

## 2014-12-04 NOTE — Assessment & Plan Note (Addendum)
Annual preventive health exam as documented. Counseling done  re healthy lifestyle involving commitment to 150 minutes exercise per week, heart healthy diet, and attaining healthy weight.The importance of adequate sleep also discussed. Regular seat belt use and home safety, is also discussed. Changes in health habits are decided on by the patient with goals and time frames  set for achieving them. Immunization and cancer screening needs are specifically addressed at this visit.

## 2014-12-04 NOTE — Assessment & Plan Note (Addendum)
Uncontrolled, increase insulin dose, add glipizide , refer diabetic ed, initiate weekly calls Pt re educated as far as diet , and need to commit to daily exercise for approximately 10 minutes Given written guidelines for blood sugar goals also F/u in 12 weeks with rept labs

## 2014-12-04 NOTE — Patient Instructions (Addendum)
F/u in 12 weeks, call if you need me before  Increase lantus to 35 units every pm  New for diabetes is glipizide 5 mg one every morning at breakfast  Continue metformin one twice daily  Reduce fied and fatty foods  It is important that you exercise regularly at least 40 minutes 7 times a week. If you develop chest pain, have severe difficulty breathing, or feel very tired, stop exercising immediately and seek medical attention   Goal for fasting blood sugar ranges from 90 to 130 and 2 hours after any meal or at bedtime should be between 140 to 180.  Call in once weekly to nurse with result for review  You are referred to diabetic educator  Fasting labs in 12 weeks  Yopu are referred to podiatry for diabetic foot care

## 2014-12-04 NOTE — Assessment & Plan Note (Signed)

## 2014-12-04 NOTE — Assessment & Plan Note (Signed)
Uncontrolled, elevated Tg and low HDL, lifestyle modification needed, no med change Hyperlipidemia:Low fat diet discussed and encouraged.   Lipid Panel  Lab Results  Component Value Date   CHOL 173 12/03/2014   HDL 37* 12/03/2014   LDLCALC 77 12/03/2014   TRIG 293* 12/03/2014   CHOLHDL 4.7 12/03/2014

## 2014-12-05 LAB — CYTOLOGY - PAP

## 2014-12-09 DIAGNOSIS — M2041 Other hammer toe(s) (acquired), right foot: Secondary | ICD-10-CM | POA: Diagnosis not present

## 2014-12-09 DIAGNOSIS — L602 Onychogryphosis: Secondary | ICD-10-CM | POA: Diagnosis not present

## 2014-12-09 DIAGNOSIS — M2042 Other hammer toe(s) (acquired), left foot: Secondary | ICD-10-CM | POA: Diagnosis not present

## 2014-12-09 DIAGNOSIS — L84 Corns and callosities: Secondary | ICD-10-CM | POA: Diagnosis not present

## 2014-12-09 DIAGNOSIS — E1129 Type 2 diabetes mellitus with other diabetic kidney complication: Secondary | ICD-10-CM | POA: Diagnosis not present

## 2014-12-09 DIAGNOSIS — E1151 Type 2 diabetes mellitus with diabetic peripheral angiopathy without gangrene: Secondary | ICD-10-CM | POA: Diagnosis not present

## 2014-12-12 ENCOUNTER — Telehealth: Payer: Self-pay

## 2014-12-12 NOTE — Telephone Encounter (Signed)
Pt aware.

## 2014-12-12 NOTE — Telephone Encounter (Signed)
Continue current dose.

## 2014-12-12 NOTE — Telephone Encounter (Signed)
mon- 15 tues- 164 Wed- 144 thurs- 204 (thinks she ate too many carbs the previous evening fri- 139  On 30 units of insulin presently

## 2014-12-26 NOTE — Addendum Note (Signed)
Addended by: Denman George B on: 12/26/2014 01:21 PM   Modules accepted: Orders

## 2015-01-06 DIAGNOSIS — G603 Idiopathic progressive neuropathy: Secondary | ICD-10-CM | POA: Diagnosis not present

## 2015-01-06 DIAGNOSIS — G609 Hereditary and idiopathic neuropathy, unspecified: Secondary | ICD-10-CM | POA: Diagnosis not present

## 2015-01-16 ENCOUNTER — Other Ambulatory Visit: Payer: Self-pay

## 2015-01-16 ENCOUNTER — Telehealth: Payer: Self-pay

## 2015-01-16 DIAGNOSIS — E1122 Type 2 diabetes mellitus with diabetic chronic kidney disease: Secondary | ICD-10-CM

## 2015-01-16 MED ORDER — INSULIN GLARGINE 100 UNIT/ML SOLOSTAR PEN: 40.0000 [IU] | PEN_INJECTOR | Freq: Every day | SUBCUTANEOUS | Status: DC

## 2015-01-16 NOTE — Telephone Encounter (Signed)
Calling in her weekly numbers  mon-98 tues 784 XQK 208  HNGIT 195 VDI 718 the 2 higher numbers were taken after she had ate breakfast. Reminded to test fasting before eating anf drinking and to stay at 30 units of lantus for now and report back next week

## 2015-01-27 DIAGNOSIS — G603 Idiopathic progressive neuropathy: Secondary | ICD-10-CM | POA: Diagnosis not present

## 2015-01-27 DIAGNOSIS — M79671 Pain in right foot: Secondary | ICD-10-CM | POA: Diagnosis not present

## 2015-01-27 DIAGNOSIS — M2042 Other hammer toe(s) (acquired), left foot: Secondary | ICD-10-CM | POA: Diagnosis not present

## 2015-01-27 DIAGNOSIS — M2041 Other hammer toe(s) (acquired), right foot: Secondary | ICD-10-CM | POA: Diagnosis not present

## 2015-01-27 DIAGNOSIS — M79672 Pain in left foot: Secondary | ICD-10-CM | POA: Diagnosis not present

## 2015-02-25 DIAGNOSIS — Z794 Long term (current) use of insulin: Secondary | ICD-10-CM | POA: Diagnosis not present

## 2015-02-25 DIAGNOSIS — E119 Type 2 diabetes mellitus without complications: Secondary | ICD-10-CM | POA: Diagnosis not present

## 2015-02-25 DIAGNOSIS — Z961 Presence of intraocular lens: Secondary | ICD-10-CM | POA: Diagnosis not present

## 2015-02-25 LAB — HM DIABETES EYE EXAM

## 2015-03-01 ENCOUNTER — Other Ambulatory Visit: Payer: Self-pay | Admitting: Family Medicine

## 2015-03-05 DIAGNOSIS — I10 Essential (primary) hypertension: Secondary | ICD-10-CM | POA: Diagnosis not present

## 2015-03-05 DIAGNOSIS — E785 Hyperlipidemia, unspecified: Secondary | ICD-10-CM | POA: Diagnosis not present

## 2015-03-05 DIAGNOSIS — E1129 Type 2 diabetes mellitus with other diabetic kidney complication: Secondary | ICD-10-CM | POA: Diagnosis not present

## 2015-03-05 DIAGNOSIS — N189 Chronic kidney disease, unspecified: Secondary | ICD-10-CM | POA: Diagnosis not present

## 2015-03-05 DIAGNOSIS — E1122 Type 2 diabetes mellitus with diabetic chronic kidney disease: Secondary | ICD-10-CM | POA: Diagnosis not present

## 2015-03-06 LAB — COMPLETE METABOLIC PANEL WITH GFR
ALT: 30 U/L — ABNORMAL HIGH (ref 6–29)
AST: 31 U/L (ref 10–35)
Albumin: 4 g/dL (ref 3.6–5.1)
Alkaline Phosphatase: 36 U/L (ref 33–130)
BUN: 14 mg/dL (ref 7–25)
CO2: 29 mmol/L (ref 20–31)
Calcium: 9.5 mg/dL (ref 8.6–10.4)
Chloride: 101 mmol/L (ref 98–110)
Creat: 1 mg/dL — ABNORMAL HIGH (ref 0.60–0.93)
GFR, Est African American: 65 mL/min (ref >=60)
GFR, Est Non African American: 56 mL/min — ABNORMAL LOW (ref >=60)
Glucose, Bld: 144 mg/dL — ABNORMAL HIGH (ref 65–99)
Potassium: 4.9 mmol/L (ref 3.5–5.3)
Sodium: 138 mmol/L (ref 135–146)
Total Bilirubin: 0.5 mg/dL (ref 0.2–1.2)
Total Protein: 7.3 g/dL (ref 6.1–8.1)

## 2015-03-06 LAB — CBC WITH DIFFERENTIAL/PLATELET
Basophils Absolute: 0.1 10*3/uL (ref 0.0–0.1)
Basophils Relative: 1 % (ref 0–1)
Eosinophils Absolute: 0.3 10*3/uL (ref 0.0–0.7)
Eosinophils Relative: 4 % (ref 0–5)
HCT: 36.2 % (ref 36.0–46.0)
Hemoglobin: 11.9 g/dL — ABNORMAL LOW (ref 12.0–15.0)
Lymphocytes Relative: 32 % (ref 12–46)
Lymphs Abs: 2.4 10*3/uL (ref 0.7–4.0)
MCH: 28 pg (ref 26.0–34.0)
MCHC: 32.9 g/dL (ref 30.0–36.0)
MCV: 85.2 fL (ref 78.0–100.0)
MPV: 10.7 fL (ref 8.6–12.4)
Monocytes Absolute: 0.5 10*3/uL (ref 0.1–1.0)
Monocytes Relative: 7 % (ref 3–12)
Neutro Abs: 4.1 10*3/uL (ref 1.7–7.7)
Neutrophils Relative %: 56 % (ref 43–77)
Platelets: 230 10*3/uL (ref 150–400)
RBC: 4.25 MIL/uL (ref 3.87–5.11)
RDW: 15 % (ref 11.5–15.5)
WBC: 7.4 10*3/uL (ref 4.0–10.5)

## 2015-03-06 LAB — LIPID PANEL
Cholesterol: 138 mg/dL (ref 125–200)
HDL: 38 mg/dL — ABNORMAL LOW (ref >=46)
LDL Cholesterol: 70 mg/dL (ref <130)
Total CHOL/HDL Ratio: 3.6 Ratio (ref <=5.0)
Triglycerides: 148 mg/dL (ref <150)
VLDL: 30 mg/dL (ref <30)

## 2015-03-06 LAB — HEMOGLOBIN A1C
Hgb A1c MFr Bld: 7 % — ABNORMAL HIGH (ref <5.7)
Mean Plasma Glucose: 154 mg/dL — ABNORMAL HIGH (ref <117)

## 2015-03-18 ENCOUNTER — Ambulatory Visit (INDEPENDENT_AMBULATORY_CARE_PROVIDER_SITE_OTHER): Payer: Medicare Other | Admitting: Family Medicine

## 2015-03-18 ENCOUNTER — Encounter: Payer: Self-pay | Admitting: Family Medicine

## 2015-03-18 VITALS — BP 138/80 | HR 84 | Resp 16 | Ht 65.0 in | Wt 181.0 lb

## 2015-03-18 DIAGNOSIS — I10 Essential (primary) hypertension: Secondary | ICD-10-CM | POA: Diagnosis not present

## 2015-03-18 DIAGNOSIS — E663 Overweight: Secondary | ICD-10-CM | POA: Diagnosis not present

## 2015-03-18 DIAGNOSIS — E785 Hyperlipidemia, unspecified: Secondary | ICD-10-CM | POA: Diagnosis not present

## 2015-03-18 DIAGNOSIS — E1122 Type 2 diabetes mellitus with diabetic chronic kidney disease: Secondary | ICD-10-CM | POA: Diagnosis not present

## 2015-03-18 DIAGNOSIS — Z794 Long term (current) use of insulin: Secondary | ICD-10-CM

## 2015-03-18 MED ORDER — METFORMIN HCL 1000 MG PO TABS: 1000.0000 mg | ORAL_TABLET | Freq: Every day | ORAL | Status: DC

## 2015-03-18 NOTE — Patient Instructions (Addendum)
Annual wellness in 3. month, call if you need me before  HBA1C, chem 7 and EGFR and microalb in 3. Month  CONGRATS on IMPROVED hEALTH  ONE metformin once daily   Please work on good  health habits so that your health will improve. 1. Commitment to daily physical activity for 30 to 60  minutes, if you are able to do this.  2. Commitment to wise food choices. Aim for half of your  food intake to be vegetable and fruit, one quarter starchy foods, and one quarter protein. Try to eat on a regular schedule  3 meals per day, snacking between meals should be limited to vegetables or fruits or small portions of nuts. 64 ounces of water per day is generally recommended, unless you have specific health conditions, like heart failure or kidney failure where you will need to limit fluid intake.  3. Commitment to sufficient and a  good quality of physical and mental rest daily, generally between 6 to 8 hours per day.  WITH PERSISTANCE AND PERSEVERANCE, THE IMPOSSIBLE , BECOMES THE NORM!  Thanks for choosing [facility], we consider it a privelige to serve you.

## 2015-03-18 NOTE — Progress Notes (Signed)
Subjective:    Patient ID: Joanna Brown, female    DOB: Apr 27, 1941, 74 y.o.   MRN: [id number]  HPI   Joanna Brown     MRN: [id number]      DOB: Jul 17, 1940   HPI Joanna Brown is here for follow up and re-evaluation of chronic medical conditions, medication management and review of any available recent lab and radiology data.  Preventive health is updated, specifically  Cancer screening and Immunization.  Refuses  Flu vaccine Questions or concerns regarding consultations or procedures which the PT has had in the interim are  addressed. The PT c/o nausea and loose stool with metformin, however agrees to lower dose as she has improved significantly overall There are no new concerns.  There are no specific complaints   ROS Denies recent fever or chills. Denies sinus pressure, nasal congestion, ear pain or sore throat. Denies chest congestion, productive cough or wheezing. Denies chest pains, palpitations and leg swelling Denies abdominal pain, nausea, vomiting,diarrhea or constipation.   Denies dysuria, frequency, hesitancy or incontinence. Denies joint pain, swelling and limitation in mobility. Denies headaches, seizures, numbness, or tingling. Denies depression, anxiety or insomnia. Denies skin break down or rash.   PE  BP 138/80 mmHg  Pulse 84  Resp 16  Ht 5\' 5"  (1.651 m)  Wt 181 lb (82.101 kg)  BMI 30.12 kg/m2  SpO2 99%  Patient alert and oriented and in no cardiopulmonary distress.  HEENT: No facial asymmetry, EOMI,   oropharynx pink and moist.  Neck supple no JVD, no mass.  Chest: Clear to auscultation bilaterally.  CVS: S1, S2 no murmurs, no S3.Regular rate.  ABD: Soft non tender.   Ext: No edema  MS: Adequate ROM spine, shoulders, hips and knees.  Skin: Intact, no ulcerations or rash noted.  Psych: Good eye contact, normal affect. Memory intact not anxious or depressed appearing.  CNS: CN 2-12 intact, power,  normal throughout.no focal deficits  noted.   Assessment & Plan   Type 2 diabetes mellitus with chronic kidney disease Improved, applauded on this Will resume metformin at lower dose Joanna Brown is reminded of the importance of commitment to daily physical activity for 30 minutes or more, as able and the need to limit carbohydrate intake to 30 to 60 grams per meal to help with blood sugar control.   The need to take medication as prescribed, test blood sugar as directed, and to call between visits if there is a concern that blood sugar is uncontrolled is also discussed.   Joanna Brown is reminded of the importance of daily foot exam, annual eye examination, and good blood sugar, blood pressure and cholesterol control.  Diabetic Labs Latest Ref Rng 03/05/2015 12/03/2014 07/18/2014 03/24/2014 03/03/2014  HbA1c <5.7 % 7.0(H) 8.3(H) 8.0(H) - 7.6(H)  Microalbumin <2.0 mg/dL - - - 0.3 -  Micro/Creat Ratio 0.0 - 30.0 mg/g - - - 17.1 -  Chol 125 - 200 mg/dL 138 173 162 - 163  HDL >=46 mg/dL 38(L) 37(L) 38(L) - 36(L)  Calc LDL <130 mg/dL 70 77 73 - 79  Triglycerides <150 mg/dL 148 293(H) 254(H) - 240(H)  Creatinine 0.60 - 0.93 mg/dL 1.00(H) 0.99 1.06 - 1.34(H)   BP/Weight 03/18/2015 12/04/2014 08/25/2014 07/23/2014 07/12/2014 07/11/2014 28/07/1515  Systolic BP 616 073 710 626 948 - 546  Diastolic BP 80 80 62 80 85 - 70  Wt. (Lbs) 181 181 179.6 179 - 174 176  BMI 30.12 30.12 29.89 29.79 - 28.96 29.29  Foot/eye exam completion dates Latest Ref Rng 02/25/2015 12/04/2014  Eye Exam No Retinopathy No Retinopathy -  Foot Form Completion - - Done         Hypertension goal BP (blood pressure) < 130/80 Controlled, no change in medication DASH diet and commitment to daily physical activity for a minimum of 30 minutes discussed and encouraged, as a part of hypertension management. The importance of attaining a healthy weight is also discussed.  BP/Weight 03/18/2015 12/04/2014 08/25/2014 07/23/2014 07/12/2014 07/11/2014 19/08/1738  Systolic BP 814  481 856 314 970 - 263  Diastolic BP 80 80 62 80 85 - 70  Wt. (Lbs) 181 181 179.6 179 - 174 176  BMI 30.12 30.12 29.89 29.79 - 28.96 29.29        Overweight (BMI 25.0-29.9) Unchanged Patient re-educated about  the importance of commitment to a  minimum of 150 minutes of exercise per week.  The importance of healthy food choices with portion control discussed. Encouraged to start a food diary, count calories and to consider  joining a support group. Sample diet sheets offered. Goals set by the patient for the next several months.   Weight /BMI 03/18/2015 12/04/2014 08/25/2014  WEIGHT 181 lb 181 lb 179 lb 9.6 oz  HEIGHT 5\' 5"  5\' 5"  5\' 5"   BMI 30.12 kg/m2 30.12 kg/m2 29.89 kg/m2    Current exercise per week 120 minutes.   Hyperlipidemia LDL goal <100 Hyperlipidemia:Low fat diet discussed and encouraged.   Lipid Panel  Lab Results  Component Value Date   CHOL 138 03/05/2015   HDL 38* 03/05/2015   LDLCALC 70 03/05/2015   TRIG 148 03/05/2015   CHOLHDL 3.6 03/05/2015   Controlled, no change in medication Needs to increase exercise         Review of Systems     Objective:   Physical Exam        Assessment & Plan:

## 2015-03-29 NOTE — Assessment & Plan Note (Signed)
Hyperlipidemia:Low fat diet discussed and encouraged.   Lipid Panel  Lab Results  Component Value Date   CHOL 138 03/05/2015   HDL 38* 03/05/2015   LDLCALC 70 03/05/2015   TRIG 148 03/05/2015   CHOLHDL 3.6 03/05/2015   Controlled, no change in medication Needs to increase exercise

## 2015-03-29 NOTE — Assessment & Plan Note (Signed)
Unchanged Patient re-educated about  the importance of commitment to a  minimum of 150 minutes of exercise per week.  The importance of healthy food choices with portion control discussed. Encouraged to start a food diary, count calories and to consider  joining a support group. Sample diet sheets offered. Goals set by the patient for the next several months.   Weight /BMI 03/18/2015 12/04/2014 08/25/2014  WEIGHT 181 lb 181 lb 179 lb 9.6 oz  HEIGHT 5\' 5"  5\' 5"  5\' 5"   BMI 30.12 kg/m2 30.12 kg/m2 29.89 kg/m2    Current exercise per week 120 minutes.

## 2015-03-29 NOTE — Assessment & Plan Note (Signed)
Improved, applauded on this Will resume metformin at lower dose Ms. Ciszek is reminded of the importance of commitment to daily physical activity for 30 minutes or more, as able and the need to limit carbohydrate intake to 30 to 60 grams per meal to help with blood sugar control.   The need to take medication as prescribed, test blood sugar as directed, and to call between visits if there is a concern that blood sugar is uncontrolled is also discussed.   Ms. Antill is reminded of the importance of daily foot exam, annual eye examination, and good blood sugar, blood pressure and cholesterol control.  Diabetic Labs Latest Ref Rng 03/05/2015 12/03/2014 07/18/2014 03/24/2014 03/03/2014  HbA1c <5.7 % 7.0(H) 8.3(H) 8.0(H) - 7.6(H)  Microalbumin <2.0 mg/dL - - - 0.3 -  Micro/Creat Ratio 0.0 - 30.0 mg/g - - - 17.1 -  Chol 125 - 200 mg/dL 138 173 162 - 163  HDL >=46 mg/dL 38(L) 37(L) 38(L) - 36(L)  Calc LDL <130 mg/dL 70 77 73 - 79  Triglycerides <150 mg/dL 148 293(H) 254(H) - 240(H)  Creatinine 0.60 - 0.93 mg/dL 1.00(H) 0.99 1.06 - 1.34(H)   BP/Weight 03/18/2015 12/04/2014 08/25/2014 07/23/2014 07/12/2014 07/11/2014 123456  Systolic BP 0000000 [id number] [id number] A999333 - 123456  Diastolic BP 80 80 62 80 85 - 70  Wt. (Lbs) 181 181 179.6 179 - 174 176  BMI 30.12 30.12 29.89 29.79 - 28.96 29.29   Foot/eye exam completion dates Latest Ref Rng 02/25/2015 12/04/2014  Eye Exam No Retinopathy No Retinopathy -  Foot Form Completion - - Done

## 2015-03-29 NOTE — Assessment & Plan Note (Signed)
Controlled, no change in medication DASH diet and commitment to daily physical activity for a minimum of 30 minutes discussed and encouraged, as a part of hypertension management. The importance of attaining a healthy weight is also discussed.  BP/Weight 03/18/2015 12/04/2014 08/25/2014 07/23/2014 07/12/2014 07/11/2014 123456  Systolic BP 0000000 [id number] A999333 - 123456  Diastolic BP 80 80 62 80 85 - 70  Wt. (Lbs) 181 181 179.6 179 - 174 176  BMI 30.12 30.12 29.89 29.79 - 28.96 29.29

## 2015-03-30 DIAGNOSIS — M216X2 Other acquired deformities of left foot: Secondary | ICD-10-CM | POA: Diagnosis not present

## 2015-03-30 DIAGNOSIS — L602 Onychogryphosis: Secondary | ICD-10-CM | POA: Diagnosis not present

## 2015-03-30 DIAGNOSIS — M216X1 Other acquired deformities of right foot: Secondary | ICD-10-CM | POA: Diagnosis not present

## 2015-03-30 DIAGNOSIS — E1151 Type 2 diabetes mellitus with diabetic peripheral angiopathy without gangrene: Secondary | ICD-10-CM | POA: Diagnosis not present

## 2015-04-17 ENCOUNTER — Other Ambulatory Visit: Payer: Self-pay | Admitting: Family Medicine

## 2015-04-30 ENCOUNTER — Other Ambulatory Visit: Payer: Self-pay | Admitting: Family Medicine

## 2015-05-02 ENCOUNTER — Other Ambulatory Visit: Payer: Self-pay | Admitting: Family Medicine

## 2015-06-03 DIAGNOSIS — E1351 Other specified diabetes mellitus with diabetic peripheral angiopathy without gangrene: Secondary | ICD-10-CM | POA: Diagnosis not present

## 2015-06-03 DIAGNOSIS — L84 Corns and callosities: Secondary | ICD-10-CM | POA: Diagnosis not present

## 2015-06-03 DIAGNOSIS — L602 Onychogryphosis: Secondary | ICD-10-CM | POA: Diagnosis not present

## 2015-06-08 DIAGNOSIS — I10 Essential (primary) hypertension: Secondary | ICD-10-CM | POA: Diagnosis not present

## 2015-06-08 DIAGNOSIS — E1129 Type 2 diabetes mellitus with other diabetic kidney complication: Secondary | ICD-10-CM | POA: Diagnosis not present

## 2015-06-08 DIAGNOSIS — E1122 Type 2 diabetes mellitus with diabetic chronic kidney disease: Secondary | ICD-10-CM | POA: Diagnosis not present

## 2015-06-08 DIAGNOSIS — N189 Chronic kidney disease, unspecified: Secondary | ICD-10-CM | POA: Diagnosis not present

## 2015-06-08 DIAGNOSIS — Z794 Long term (current) use of insulin: Secondary | ICD-10-CM | POA: Diagnosis not present

## 2015-06-08 LAB — MICROALBUMIN / CREATININE URINE RATIO
Creatinine, Urine: 115 mg/dL (ref 20–320)
Microalb Creat Ratio: 3 mcg/mg creat (ref <30)
Microalb, Ur: 0.4 mg/dL

## 2015-06-08 LAB — BASIC METABOLIC PANEL WITH GFR
BUN: 24 mg/dL (ref 7–25)
CO2: 27 mmol/L (ref 20–31)
Calcium: 9.9 mg/dL (ref 8.6–10.4)
Chloride: 98 mmol/L (ref 98–110)
Creat: 1.2 mg/dL — ABNORMAL HIGH (ref 0.60–0.93)
GFR, Est African American: 52 mL/min — ABNORMAL LOW (ref >=60)
GFR, Est Non African American: 45 mL/min — ABNORMAL LOW (ref >=60)
Glucose, Bld: 179 mg/dL — ABNORMAL HIGH (ref 65–99)
Potassium: 3.9 mmol/L (ref 3.5–5.3)
Sodium: 137 mmol/L (ref 135–146)

## 2015-06-08 LAB — HEMOGLOBIN A1C
Hgb A1c MFr Bld: 7.8 % — ABNORMAL HIGH (ref <5.7)
Mean Plasma Glucose: 177 mg/dL — ABNORMAL HIGH (ref <117)

## 2015-06-17 ENCOUNTER — Ambulatory Visit (INDEPENDENT_AMBULATORY_CARE_PROVIDER_SITE_OTHER): Payer: Medicare Other | Admitting: Family Medicine

## 2015-06-17 ENCOUNTER — Encounter: Payer: Self-pay | Admitting: Family Medicine

## 2015-06-17 VITALS — BP 130/70 | HR 84 | Resp 18 | Ht 65.0 in | Wt 184.0 lb

## 2015-06-17 DIAGNOSIS — Z0000 Encounter for general adult medical examination without abnormal findings: Secondary | ICD-10-CM | POA: Diagnosis not present

## 2015-06-17 DIAGNOSIS — Z794 Long term (current) use of insulin: Secondary | ICD-10-CM

## 2015-06-17 DIAGNOSIS — E785 Hyperlipidemia, unspecified: Secondary | ICD-10-CM

## 2015-06-17 DIAGNOSIS — E1122 Type 2 diabetes mellitus with diabetic chronic kidney disease: Secondary | ICD-10-CM

## 2015-06-17 MED ORDER — BENAZEPRIL HCL 40 MG PO TABS: ORAL_TABLET | ORAL | Status: DC

## 2015-06-17 MED ORDER — AMLODIPINE BESYLATE 10 MG PO TABS: ORAL_TABLET | ORAL | Status: DC

## 2015-06-17 MED ORDER — PRAVASTATIN SODIUM 80 MG PO TABS
80.0000 mg | ORAL_TABLET | Freq: Every day | ORAL | Status: DC
Start: 2015-06-17 — End: 2016-04-03

## 2015-06-17 MED ORDER — GLIPIZIDE ER 5 MG PO TB24: ORAL_TABLET | ORAL | Status: DC

## 2015-06-17 MED ORDER — INSULIN DETEMIR 100 UNIT/ML FLEXPEN: 40.0000 [IU] | PEN_INJECTOR | Freq: Every day | SUBCUTANEOUS | Status: DC

## 2015-06-17 NOTE — Assessment & Plan Note (Signed)

## 2015-06-17 NOTE — Progress Notes (Signed)
Subjective:    Patient ID: Joanna Brown, female    DOB: [birth year], 75 y.o.   MRN: [id number]  HPI Preventive Screening-Counseling & Management   Patient present here today for a Medicare annual wellness visit.   Current Problems (verified)   Medications Prior to Visit Allergies (verified)   PAST HISTORY  Family History (updated)  Social History Married, children are deceased.  Formerly worked for [employer]   Risk Factors  Current exercise habits:  Walks daily  Dietary issues discussed:  Heart healthy low fat diet    Cardiac risk factors:   Depression Screen  (Note: if answer to either of the following is "Yes", a more complete depression screening is indicated)   Over the past two weeks, have you felt down, depressed or hopeless? No  Over the past two weeks, have you felt little interest or pleasure in doing things? No  Have you lost interest or pleasure in daily life? No  Do you often feel hopeless? No  Do you cry easily over simple problems? No   Activities of Daily Living  In your present state of health, do you have any difficulty performing the following activities?  Driving?: No Managing money?: No Feeding yourself?:No Getting from bed to chair?:No Climbing a flight of stairs?:No Preparing food and eating?:No Bathing or showering?:No Getting dressed?:No Getting to the toilet?:No Using the toilet?:No Moving around from place to place?: No  Fall Risk Assessment In the past year have you fallen or had a near fall?:No Are you currently taking any medications that make you dizzy?:No   Hearing Difficulties: No Do you often ask people to speak up or repeat themselves?:No Do you experience ringing or noises in your ears?:No Do you have difficulty understanding soft or whispered voices?:No  Cognitive Testing  Alert? Yes Normal Appearance?Yes  Oriented to person? Yes Place? Yes  Time? Yes  Displays appropriate judgment?Yes  Can read the  correct time from a watch face? yes Are you having problems remembering things?No age appropriate   Advanced Directives have been discussed with the patient?Yes and brochure and forms given    List the Names of Other Physician/Practitioners you currently use: careteams updated    Indicate any recent Medical Services you may have received from other than [facility] providers in the past year (date may be approximate).   Assessment:    Annual Wellness Exam   Plan:     Medicare Attestation  I have personally reviewed:  The patient's medical and social history  Their use of alcohol, tobacco or illicit drugs  Their current medications and supplements  The patient's functional ability including ADLs,fall risks, home safety risks, cognitive, and hearing and visual impairment  Diet and physical activities  Evidence for depression or mood disorders  The patient's weight, height, BMI, and visual acuity have been recorded in the chart. I have made referrals, counseling, and provided education to the patient based on review of the above and I have provided the patient with a written personalized care plan for preventive services.      Review of Systems     Objective:   Physical Exam BP 130/70 mmHg  Pulse 84  Resp 18  Ht 5\' 5"  (1.651 m)  Wt 184 lb (83.462 kg)  BMI 30.62 kg/m2  SpO2 98%        Assessment & Plan:  Medicare annual wellness visit, subsequent Annual exam as documented. Counseling done  re healthy lifestyle involving commitment to 150 minutes exercise per  week, heart healthy diet, and attaining healthy weight.The importance of adequate sleep also discussed. Regular seat belt use and home safety, is also discussed. Changes in health habits are decided on by the patient with goals and time frames  set for achieving them. Immunization and cancer screening needs are specifically addressed at this visit.   Type 2 diabetes mellitus with chronic kidney disease [name]  is reminded of the importance of commitment to daily physical activity for 30 minutes or more, as able and the need to limit carbohydrate intake to 30 to 60 grams per meal to help with blood sugar control.   The need to take medication as prescribed, test blood sugar as directed, and to call between visits if there is a concern that blood sugar is uncontrolled is also discussed.   [name] is reminded of the importance of daily foot exam, annual eye examination, and good blood sugar, blood pressure and cholesterol control.  Diabetic Labs Latest Ref Rng 06/08/2015 03/05/2015 12/03/2014 07/18/2014 03/24/2014  HbA1c <5.7 % 7.8(H) 7.0(H) 8.3(H) 8.0(H) -  Microalbumin Not estab mg/dL 0.4 - - - 0.3  Micro/Creat Ratio <30 mcg/mg creat 3 - - - 17.1  Chol 125 - 200 mg/dL - 138 173 162 -  HDL >=46 mg/dL - 38(L) 37(L) 38(L) -  Calc LDL <130 mg/dL - 70 77 73 -  Triglycerides <150 mg/dL - 148 293(H) 254(H) -  Creatinine 0.60 - 0.93 mg/dL 1.20(H) 1.00(H) 0.99 1.06 -   BP/Weight 06/17/2015 03/18/2015 12/04/2014 08/25/2014 07/23/2014 07/12/2014 [id number]  Systolic BP [id number] 0000000 [id number] A999333 -  Diastolic BP 70 80 80 62 80 85 -  Wt. (Lbs) 184 181 181 179.6 179 - 174  BMI 30.62 30.12 30.12 29.89 29.79 - 28.96   Foot/eye exam completion dates Latest Ref Rng 02/25/2015 12/04/2014  Eye Exam No Retinopathy No Retinopathy -  Foot Form Completion - - Done   Deteriorated, dietary counseling done for 5 minutes and she is referred to diabetic educator for further counseling Increase levemir from 35 to 38 unis

## 2015-06-17 NOTE — Patient Instructions (Addendum)
F/u in 3 month, call if you need me sooner  Bl;ood pressure and function are excellent  Blood sugar and weight need to improve  Start 38 units daily of lantus/ levimir  You are referred to diabetic educator important to eat on regular schedule and to limit carb setvings to 2 to 3 per meal  hBa1C fasting lipid, cmpp and EGFr in 3 month

## 2015-06-18 NOTE — Assessment & Plan Note (Signed)
Joanna Brown is reminded of the importance of commitment to daily physical activity for 30 minutes or more, as able and the need to limit carbohydrate intake to 30 to 60 grams per meal to help with blood sugar control.   The need to take medication as prescribed, test blood sugar as directed, and to call between visits if there is a concern that blood sugar is uncontrolled is also discussed.   Joanna Brown is reminded of the importance of daily foot exam, annual eye examination, and good blood sugar, blood pressure and cholesterol control.  Diabetic Labs Latest Ref Rng 06/08/2015 03/05/2015 12/03/2014 07/18/2014 03/24/2014  HbA1c <5.7 % 7.8(H) 7.0(H) 8.3(H) 8.0(H) -  Microalbumin Not estab mg/dL 0.4 - - - 0.3  Micro/Creat Ratio <30 mcg/mg creat 3 - - - 17.1  Chol 125 - 200 mg/dL - 138 173 162 -  HDL >=46 mg/dL - 38(L) 37(L) 38(L) -  Calc LDL <130 mg/dL - 70 77 73 -  Triglycerides <150 mg/dL - 148 293(H) 254(H) -  Creatinine 0.60 - 0.93 mg/dL 1.20(H) 1.00(H) 0.99 1.06 -   BP/Weight 06/17/2015 03/18/2015 12/04/2014 08/25/2014 07/23/2014 07/12/2014 [id number]  Systolic BP [id number] 0000000 [id number] A999333 -  Diastolic BP 70 80 80 62 80 85 -  Wt. (Lbs) 184 181 181 179.6 179 - 174  BMI 30.62 30.12 30.12 29.89 29.79 - 28.96   Foot/eye exam completion dates Latest Ref Rng 02/25/2015 12/04/2014  Eye Exam No Retinopathy No Retinopathy -  Foot Form Completion - - Done   Deteriorated, dietary counseling done for 5 minutes and she is referred to diabetic educator for further counseling Increase levemir from 35 to 38 unis

## 2015-06-30 DIAGNOSIS — H401131 Primary open-angle glaucoma, bilateral, mild stage: Secondary | ICD-10-CM | POA: Diagnosis not present

## 2015-07-17 ENCOUNTER — Other Ambulatory Visit: Payer: Self-pay | Admitting: Family Medicine

## 2015-07-22 ENCOUNTER — Encounter: Payer: Self-pay | Admitting: Nutrition

## 2015-07-22 ENCOUNTER — Encounter: Payer: Medicare Other | Attending: Family Medicine | Admitting: Nutrition

## 2015-07-22 VITALS — Ht 65.0 in | Wt 186.0 lb

## 2015-07-22 DIAGNOSIS — E1122 Type 2 diabetes mellitus with diabetic chronic kidney disease: Secondary | ICD-10-CM | POA: Diagnosis not present

## 2015-07-22 DIAGNOSIS — N189 Chronic kidney disease, unspecified: Secondary | ICD-10-CM | POA: Diagnosis not present

## 2015-07-22 DIAGNOSIS — E669 Obesity, unspecified: Secondary | ICD-10-CM

## 2015-07-22 DIAGNOSIS — E1165 Type 2 diabetes mellitus with hyperglycemia: Secondary | ICD-10-CM

## 2015-07-22 DIAGNOSIS — E118 Type 2 diabetes mellitus with unspecified complications: Secondary | ICD-10-CM

## 2015-07-22 DIAGNOSIS — Z794 Long term (current) use of insulin: Secondary | ICD-10-CM | POA: Diagnosis not present

## 2015-07-22 DIAGNOSIS — IMO0002 Reserved for concepts with insufficient information to code with codable children: Secondary | ICD-10-CM

## 2015-07-22 NOTE — Progress Notes (Signed)
  Medical Nutrition Therapy:  Appt start time: 0800 and 0900 end time:   Assessment:  Primary concerns today: Diabetes Type 2. Most recent A1C 7.8%. LIves with her husband. She does the cooking and shopping. Most foods are broiled and baked. Most foods are eaten at home. Exercise: walk 5 days a week for a about 30-40 minutes per day. Testing blood sugars once a day. FBS: 200's mg/dl. 40 units of Levemir at night. Metformin 1000 mg once a day. Eats usually 2 meals a day; breakfast and lunch.  Eats snacks between meals at times and later at night. Drinks water only. Her preferred weight is 160-170 lbs. She weighted  about 170 lbs a year. Has gained about 18 lbs in the last year and doesn't know why. Diet is inconsistent with carbs, protein and low in fresh fruits and vegetables daily.  Preferred Learning Style:   No preference indicated   Learning Readiness:  Ready  Change in progress   MEDICATIONS:   DIETARY INTAKE:  24-hr recall:  B ( AM): Oatmeal or cereal-corn flakes or cherrios with 2% milk, sometimes a banana 1/2  Snk ( AM): pb cracker, water  L ( PM):  Steamed veggies(squash,peppers, onions, cabbage and zucchini) and coleslaw, water Snk ( PM): gram crackers-2  D ( PM): Green peas 3/4 c mashed potatoes  1 cup and piece of fish-broiled, water Snk ( PM): none Beverages: water  Usual physical activity: walking 2 miles  Estimated energy needs: 1500 calories 170 g carbohydrates 112 g protein 42 g fat  Progress Towards Goal(s):  In progress.   Nutritional Diagnosis:  NB-1.1 Food and nutrition-related knowledge deficit As related to Diabetes.  As evidenced by A1C 7.8%.    Intervention:  Nutrition and Diabetes education provided on My Plate, CHO counting, meal planning, portion sizes, timing of meals, avoiding snacks between meals unless having a low blood sugar, target ranges for A1C and blood sugars, signs/symptoms and treatment of hyper/hypoglycemia, monitoring blood sugars,  taking medications as prescribed, benefits of exercising 30 minutes per day and prevention of complications of DM.  Goals 1. Follow Plate Method 2.  Eat 2-3 carb choices per meals. 3. Increase fresh fruits and vegetables. 4. Increase physical activity to 30-45 mintues 5 times per day. 5. Test blood sugars before meals and bedtime for 1 week. 6. Keep food journal. 7. Get A1C down to 7% in three months.  Teaching Method Utilized:  Visual Auditory Hands on  Handouts given during visit include:  The Plate Method  Meal Plan Card  Diabetes Instructions.   Barriers to learning/adherence to lifestyle change: None  Demonstrated degree of understanding via:  Teach Back   Monitoring/Evaluation:  Dietary intake, exercise, meal planning, SBG, and body weight in 1 week. Will  Have her check blood sugar before meals and bedtime for 1 week and keep food journal and bring at next visit and will evaluate BS readings and food journal at that time.     Since she is on Levemir, recommend to discontinue Glipizide. May consider offering Metformin 500 mg BID due to CRI instead of 1000 mg at breakfast.

## 2015-07-22 NOTE — Patient Instructions (Signed)
Goals 1. Follow Plate Method 2.  Eat 2-3 carb choices per meals. 3. Increase fresh fruits and vegetables. 4. Increase physical activity to 30-45 mintues 5 times per day. 5. Test blood sugars before meals and bedtime for 1 week. 6. Keep food journal. 7. Get A1C down to 7% in three months.

## 2015-07-27 ENCOUNTER — Other Ambulatory Visit: Payer: Self-pay | Admitting: Family Medicine

## 2015-07-29 DIAGNOSIS — H401131 Primary open-angle glaucoma, bilateral, mild stage: Secondary | ICD-10-CM | POA: Diagnosis not present

## 2015-07-30 ENCOUNTER — Encounter: Payer: Self-pay | Admitting: Nutrition

## 2015-07-30 ENCOUNTER — Encounter: Payer: Medicare Other | Admitting: Nutrition

## 2015-07-30 VITALS — Ht 65.0 in | Wt 183.4 lb

## 2015-07-30 DIAGNOSIS — IMO0002 Reserved for concepts with insufficient information to code with codable children: Secondary | ICD-10-CM

## 2015-07-30 DIAGNOSIS — E1122 Type 2 diabetes mellitus with diabetic chronic kidney disease: Secondary | ICD-10-CM | POA: Diagnosis not present

## 2015-07-30 DIAGNOSIS — Z794 Long term (current) use of insulin: Secondary | ICD-10-CM | POA: Diagnosis not present

## 2015-07-30 DIAGNOSIS — N189 Chronic kidney disease, unspecified: Secondary | ICD-10-CM | POA: Diagnosis not present

## 2015-07-30 DIAGNOSIS — E118 Type 2 diabetes mellitus with unspecified complications: Principal | ICD-10-CM

## 2015-07-30 DIAGNOSIS — E669 Obesity, unspecified: Secondary | ICD-10-CM

## 2015-07-30 DIAGNOSIS — E1165 Type 2 diabetes mellitus with hyperglycemia: Secondary | ICD-10-CM

## 2015-07-30 NOTE — Patient Instructions (Signed)
Goals 1. Follow Plate Method 2.  Eat 2-3 carb choices per meals. 3. Be sure to eat before 7 pm.. 4. Choose protein or veggies for snacks unless blood sugar is low.. 5. Test blood sugars before meals and bedtime for 1 week. 6. Keep food journal continue 7. Get A1C down to 7% in three months. 8. Lose 1-2 lbs per week.

## 2015-07-30 NOTE — Progress Notes (Signed)
  Medical Nutrition Therapy:  Appt start time: 0800 and 0900 end time:   Assessment:  Primary concerns today: Diabetes Type 2. Most recent A1C 7.8%.  Hasn't seen Dr. Moshe Cipro again yet. Is working on better balanced meals and eating meals on time. Still exercising. Lose 3 lbs.    FBS: 110-150's am: Bedtime: 150's. BS are improving nicely.  40 units of Levemir at night. Metformin 1000 mg once a day. Eating three meals per day now. Has cut out snacks. Drinks water only. Her preferred weight is 160-170 lbs. She weighted  about 170 lbs a year. Diet is DIet is more consistent with carbs, protein and still needs fresh fruits and vegetables daily.  Preferred Learning Style:   No preference indicated   Learning Readiness:  Ready  Change in progress   MEDICATIONS:   DIETARY INTAKE:  24-hr recall:  B ( AM): Cherrios, 1 c, 1% milk,  Snk ( AM):  L ( PM):  Steamed veggies(squash,peppers, onions, cabbage and zucchini) and coleslaw, water Snk ( PM):  D ( PM): Chicken, poatoes, corn, broccoli,  Snk ( PM): none Beverages: water  Usual physical activity: walking 2 miles  Estimated energy needs: 1500 calories 170 g carbohydrates 112 g protein 42 g fat  Progress Towards Goal(s):  In progress.   Nutritional Diagnosis:  NB-1.1 Food and nutrition-related knowledge deficit As related to Diabetes.  As evidenced by A1C 7.8%.    Intervention:  Nutrition and Diabetes education provided on My Plate, CHO counting, meal planning, portion sizes, timing of meals, avoiding snacks between meals unless having a low blood sugar, target ranges for A1C and blood sugars, signs/symptoms and treatment of hyper/hypoglycemia, monitoring blood sugars, taking medications as prescribed, benefits of exercising 30 minutes per day and prevention of complications of DM.  Goals 1. Follow Plate Method 2.  Eat 2-3 carb choices per meals. 3. Be sure to eat before 7 pm.. 4. Choose protein or veggies for snacks unless blood  sugar is low.. 5. Test blood sugars before meals and bedtime for 1 week. 6. Keep food journal continue 7. Get A1C down to 7% in three months. 8. Lose 1-2 lbs per week.  Teaching Method Utilized:  Visual Auditory Hands on  Handouts given during visit include:  The Plate Method  Meal Plan Card  Diabetes Instructions.   Barriers to learning/adherence to lifestyle change: None  Demonstrated degree of understanding via:  Teach Back   Monitoring/Evaluation:  Dietary intake, exercise, meal planning, SBG, and body weight in 1 week. Will  Have her check blood sugar before meals and bedtime for 1 week and keep food journal and bring at next visit and will evaluate BS readings and food journal at that time.     Since she is on Levemir, recommend to discontinue Glipizide. May consider offering Metformin 500 mg BID due to CRI instead of 1000 mg at breakfast.

## 2015-08-11 DIAGNOSIS — E1351 Other specified diabetes mellitus with diabetic peripheral angiopathy without gangrene: Secondary | ICD-10-CM | POA: Diagnosis not present

## 2015-08-11 DIAGNOSIS — L602 Onychogryphosis: Secondary | ICD-10-CM | POA: Diagnosis not present

## 2015-08-14 DIAGNOSIS — Z1231 Encounter for screening mammogram for malignant neoplasm of breast: Secondary | ICD-10-CM | POA: Diagnosis not present

## 2015-08-24 ENCOUNTER — Ambulatory Visit (INDEPENDENT_AMBULATORY_CARE_PROVIDER_SITE_OTHER): Payer: Medicare Other | Admitting: Internal Medicine

## 2015-08-24 ENCOUNTER — Encounter: Payer: Self-pay | Admitting: Internal Medicine

## 2015-08-24 VITALS — BP 138/70 | HR 68 | Ht 65.0 in | Wt 180.0 lb

## 2015-08-24 DIAGNOSIS — E663 Overweight: Secondary | ICD-10-CM | POA: Diagnosis not present

## 2015-08-24 DIAGNOSIS — E785 Hyperlipidemia, unspecified: Secondary | ICD-10-CM | POA: Diagnosis not present

## 2015-08-24 DIAGNOSIS — I350 Nonrheumatic aortic (valve) stenosis: Secondary | ICD-10-CM

## 2015-08-24 DIAGNOSIS — I10 Essential (primary) hypertension: Secondary | ICD-10-CM | POA: Diagnosis not present

## 2015-08-24 NOTE — Progress Notes (Signed)
OFFICE NOTE  Chief Complaint:  No complaints  Primary Care Physician: Tula Nakayama, MD  HPI:  Joanna Brown is a 75 year old female who has a history of mild aortic stenosis with a valve area of approximately 1.7 cm, also a history of dyslipidemia and hypertension, both of which have been well controlled. We are seeing her back for an annual visit today. She reports actually feeling very well, started walking every day, has made major changes to her diet as reflected by a marked decrease in triglycerides. Unfortunately recently she had a mild elevation in liver enzymes slightly above normal on lovastatin. Typically we could tolerate liver enzymes up to 3 times normal on statin medications, however, she was taken off the lovastatin. Either way it is questionable whether she really needs the additional statin at this time and this certainly argues that she should have further workup as to why she had elevated liver enzymes, whether this is due to a new non-alcoholic steatohepatitis or perhaps concomitant medications causing her elevated liver enzymes.  In fact, I reviewed her CT scan today he years ago and she does have steatohepatitis.  Therefore she is not a good candidate for a statin medication. Her blood pressure seems to be well-controlled on her current regimen.  I saw Joanna Brown back in the office today. She is without any new complaints. She occasionally gets some heartburn and is not currently taking medication for that. She denies any chest pain or worsening shortness of breath.  His Joanna Brown returns today for follow-up. Again she is without complaints. We discussed her aortic stenosis however she seemed to have little recollection that she has this disorder. I again went over aortic stenosis and the fact that she has mild to moderate narrowing of the aortic valve. This time we are monitoring it clinically. Her last echo was in April of last year. She is asymptomatic. Her blood  pressure is well controlled. She had recent laboratory work which shows an LDL cholesterol of 70 in October which is good control. Her hemoglobin A1c is 7 which is down from 8.3 prior to that. I've encouraged her to continue with this trend is good cholesterol and blood sugar control her helpful in slowing the process of aortic stenosis.  PMHx:  Past Medical History  Diagnosis Date  . Diabetes mellitus   . GERD (gastroesophageal reflux disease)   . Hyperlipidemia   . Dysrhythmia   . Blood transfusion     1980  . Hypertension     echo and stress 4/10 reports on chart, EKG ` LOV 9/12 on chart  . Female bladder prolapse   . Heart disease   . Mild aortic stenosis     Past Surgical History  Procedure Laterality Date  . Abdominal hysterectomy    . Ovary surgery      bilateral tumors removed  . Thyroidectomy    . Lymph node dissection Right 1997    under arm  . Thyroidectomy  02/2008  . Colonoscopy w/ polypectomy    . Cataract extraction Bilateral     with IOL  . Anterior and posterior repair  04/26/2011    Procedure: ANTERIOR (CYSTOCELE) AND POSTERIOR REPAIR (RECTOCELE);  Surgeon: Reece Packer, MD;  Location: [facility];  Service: Urology;  Laterality: N/A;  . Vaginal prolapse repair  04/26/2011    Procedure: VAGINAL VAULT SUSPENSION;  Surgeon: Reece Packer, MD;  Location: [facility];  Service: Urology;  Laterality: N/A;  with Graft  10x6  .  Transthoracic echocardiogram  08/2011    EF=>55%, mild conc LVH; trace MR; mild TR; mild-mod AV calcification with mild valvular AV stenosis  . Nm myocar perf wall motion  2010    dipyridamole - mild-mod in intenstiy perfusion defect in mid anterior, mid anteroseptal wall, EF 70%  . Left heart cath  09/10/2008    normal coronary arteries, normal LV systolic function, EF 123456 (Dr. [name])  . Colonoscopy N/A 12/02/2013    Procedure: COLONOSCOPY;  Surgeon: Danie Binder, MD;  Location: [facility];  Service: Endoscopy;  Laterality: N/A;   9:30 AM - rescheduled to 8:30 - Doris to notify pt  . Cholecystectomy  2009    FAMHx:  Family History  Problem Relation Age of Onset  . Stomach cancer Mother 16  . Heart disease Mother 80    heart disease  . Heart disease Father 36    MI  . Liver cancer Sister   . Hypertension Sister   . Hypertension Sister   . Bone cancer Sister   . Stroke Maternal Grandfather   . Heart attack Paternal Grandfather   . Hypertension Child   . Hypertension Sister     SOCHx:   reports that she has never smoked. She has never used smokeless tobacco. She reports that she does not drink alcohol or use illicit drugs.  ALLERGIES:  Allergies  Allergen Reactions  . Spironolactone     Stomach problems, vision changes     ROS: Pertinent items noted in HPI and remainder of comprehensive ROS otherwise negative.  HOME MEDS: Current Outpatient Prescriptions  Medication Sig Dispense Refill  . amLODipine (NORVASC) 10 MG tablet TAKE 1 TABLET BY MOUTH EVERY MORNING 90 tablet 0  . aspirin 81 MG tablet Take 81 mg by mouth every morning.     . benazepril (LOTENSIN) 40 MG tablet TAKE 1 TABLET BY MOUTH DAILY 90 tablet 0  . Calcium Carbonate-Vit D-Min (CALCIUM 1200) 1200-1000 MG-UNIT CHEW Chew 1 each by mouth daily.    . ferrous sulfate 325 (65 FE) MG tablet Take 325 mg by mouth daily with breakfast.    . glipiZIDE (GLUCOTROL XL) 5 MG 24 hr tablet TAKE 1 TABLET(5 MG) BY MOUTH DAILY WITH BREAKFAST 90 tablet 0  . glipiZIDE (GLUCOTROL XL) 5 MG 24 hr tablet TAKE 1 TABLET(5 MG) BY MOUTH DAILY WITH BREAKFAST 90 tablet 0  . Insulin Detemir (LEVEMIR) 100 UNIT/ML Pen Inject 40 Units into the skin daily at 10 pm. 45 mL 3  . Insulin Pen Needle 31G X 8 MM MISC As directed with insulin use 100 each 5  . metFORMIN (GLUCOPHAGE) 1000 MG tablet Take 1 tablet (1,000 mg total) by mouth daily with breakfast. 90 tablet 2  . Multiple Vitamins-Minerals (CENTRUM) tablet Take 1 tablet by mouth every morning.     . Omega-3 Fatty Acids  (FISH OIL) 1200 MG CAPS Take 1 capsule by mouth every morning.     . pravastatin (PRAVACHOL) 80 MG tablet Take 1 tablet (80 mg total) by mouth daily. 90 tablet 0  . Travoprost, BAK Free, (TRAVATAN) 0.004 % SOLN ophthalmic solution Place 1 drop into both eyes at bedtime.     . triamterene-hydrochlorothiazide (MAXZIDE) 75-50 MG per tablet Take 1 tablet by mouth daily. 90 tablet 1   No current facility-administered medications for this visit.    LABS/IMAGING: No results found for this or any previous visit (from the past 48 hour(s)). No results found.  VITALS: BP 138/70 mmHg  Pulse  68  Ht 5\' 5"  (1.651 m)  Wt 180 lb (81.647 kg)  BMI 29.95 kg/m2  EXAM: General appearance: alert and no distress Neck: no carotid bruit and no JVD Lungs: clear to auscultation bilaterally Heart: regular rate and rhythm, S1, S2 normal and systolic murmur: early systolic 3/6, crescendo at 2nd left intercostal space Abdomen: soft, non-tender; bowel sounds normal; no masses,  no organomegaly Extremities: extremities normal, atraumatic, no cyanosis or edema Pulses: 2+ and symmetric Skin: Skin color, texture, turgor normal. No rashes or lesions Neurologic: Grossly normal Psych: Mood, affect normal  EKG: Normal sinus rhythm at 68, left anterior fascicular block, left axis deviation  ASSESSMENT: 1. Hypertension-controlled 2. Dyslipidemia 3. Mild to moderate aortic stenosis 4. NAFLD 5. GERD 6. DM2 on insulin  PLAN: 1.   Mrs. Dutch is asymptomatic at this time. She has well-controlled dyslipidemia on pravastatin and AST and ALT are fairly normal despite a history of NAFLD. Blood pressure is at goal. She does have mild to moderate aortic stenosis on exam and will repeat an echocardiogram next year prior to her follow-up appointment. I've encouraged her to continue to work on strict blood glucose control - A1c is recently improved to 7.0.  Follow-up annually.  [name], MD, [facility] Attending  Cardiologist [place] [facility] Shivon Hackel 08/24/2015, 10:17 AM

## 2015-08-24 NOTE — Patient Instructions (Signed)
Your physician has requested that you have an echocardiogram in 1 year @ [address] - 3rd floor. Echocardiography is a painless test that uses sound waves to create images of your heart. It provides your doctor with information about the size and shape of your heart and how well your heart's chambers and valves are working. This procedure takes approximately one hour. There are no restrictions for this procedure.  Your physician wants you to follow-up in: 1 year with Dr. [name] after your echo. You will receive a reminder letter in the mail two months in advance. If you don't receive a letter, please call our office to schedule the follow-up appointment.

## 2015-09-08 ENCOUNTER — Telehealth: Payer: Self-pay

## 2015-09-08 NOTE — Telephone Encounter (Signed)
Has a rash on her neck (and wanted a cream called in)that has been spreading toward her face and she thinks its the glipizide. I recommended benadryl cream and UC/ER eval if the rash spreads to her face or her lips/tongue or she has trouble breathing and to call back next week for a follow up

## 2015-09-09 DIAGNOSIS — E785 Hyperlipidemia, unspecified: Secondary | ICD-10-CM | POA: Diagnosis not present

## 2015-09-09 DIAGNOSIS — E1129 Type 2 diabetes mellitus with other diabetic kidney complication: Secondary | ICD-10-CM | POA: Diagnosis not present

## 2015-09-09 DIAGNOSIS — N189 Chronic kidney disease, unspecified: Secondary | ICD-10-CM | POA: Diagnosis not present

## 2015-09-09 DIAGNOSIS — E1122 Type 2 diabetes mellitus with diabetic chronic kidney disease: Secondary | ICD-10-CM | POA: Diagnosis not present

## 2015-09-09 DIAGNOSIS — Z794 Long term (current) use of insulin: Secondary | ICD-10-CM | POA: Diagnosis not present

## 2015-09-09 LAB — COMPLETE METABOLIC PANEL WITH GFR
ALT: 26 U/L (ref 6–29)
AST: 23 U/L (ref 10–35)
Albumin: 4 g/dL (ref 3.6–5.1)
Alkaline Phosphatase: 43 U/L (ref 33–130)
BUN: 17 mg/dL (ref 7–25)
CO2: 30 mmol/L (ref 20–31)
Calcium: 9.7 mg/dL (ref 8.6–10.4)
Chloride: 99 mmol/L (ref 98–110)
Creat: 1.18 mg/dL — ABNORMAL HIGH (ref 0.60–0.93)
GFR, Est African American: 53 mL/min — ABNORMAL LOW (ref >=60)
GFR, Est Non African American: 46 mL/min — ABNORMAL LOW (ref >=60)
Glucose, Bld: 170 mg/dL — ABNORMAL HIGH (ref 65–99)
Potassium: 4.4 mmol/L (ref 3.5–5.3)
Sodium: 139 mmol/L (ref 135–146)
Total Bilirubin: 0.5 mg/dL (ref 0.2–1.2)
Total Protein: 7.5 g/dL (ref 6.1–8.1)

## 2015-09-09 LAB — LIPID PANEL
Cholesterol: 186 mg/dL (ref 125–200)
HDL: 36 mg/dL — ABNORMAL LOW (ref >=46)
LDL Cholesterol: 112 mg/dL (ref <130)
Total CHOL/HDL Ratio: 5.2 Ratio — ABNORMAL HIGH (ref <=5.0)
Triglycerides: 190 mg/dL — ABNORMAL HIGH (ref <150)
VLDL: 38 mg/dL — ABNORMAL HIGH (ref <30)

## 2015-09-09 LAB — HEMOGLOBIN A1C
Hgb A1c MFr Bld: 7.4 % — ABNORMAL HIGH (ref <5.7)
Mean Plasma Glucose: 166 mg/dL

## 2015-09-15 ENCOUNTER — Ambulatory Visit (INDEPENDENT_AMBULATORY_CARE_PROVIDER_SITE_OTHER): Payer: Medicare Other | Admitting: Family Medicine

## 2015-09-15 ENCOUNTER — Encounter: Payer: Self-pay | Admitting: Family Medicine

## 2015-09-15 VITALS — BP 142/82 | HR 76 | Resp 16 | Ht 65.0 in | Wt 181.0 lb

## 2015-09-15 DIAGNOSIS — E1122 Type 2 diabetes mellitus with diabetic chronic kidney disease: Secondary | ICD-10-CM

## 2015-09-15 DIAGNOSIS — I10 Essential (primary) hypertension: Secondary | ICD-10-CM | POA: Diagnosis not present

## 2015-09-15 DIAGNOSIS — Z794 Long term (current) use of insulin: Secondary | ICD-10-CM | POA: Diagnosis not present

## 2015-09-15 DIAGNOSIS — E785 Hyperlipidemia, unspecified: Secondary | ICD-10-CM

## 2015-09-15 DIAGNOSIS — E559 Vitamin D deficiency, unspecified: Secondary | ICD-10-CM | POA: Diagnosis not present

## 2015-09-15 DIAGNOSIS — E663 Overweight: Secondary | ICD-10-CM

## 2015-09-15 NOTE — Assessment & Plan Note (Signed)
Uncontroled, no med change DASH diet and commitment to daily physical activity for a minimum of 30 minutes discussed and encouraged, as a part of hypertension management. The importance of attaining a healthy weight is also discussed.  BP/Weight 09/15/2015 08/24/2015 07/30/2015 07/22/2015 06/17/2015 03/18/2015 123456  Systolic BP A999333 0000000 - - [id number] 0000000 [id number]  Diastolic BP 82 70 - - 70 80 80  Wt. (Lbs) 181 180 183.4 186 184 181 181  BMI 30.12 29.95 30.52 30.95 30.62 30.12 30.12

## 2015-09-15 NOTE — Assessment & Plan Note (Signed)
Improved, continue with nutritional support Joanna Brown is reminded of the importance of commitment to daily physical activity for 30 minutes or more, as able and the need to limit carbohydrate intake to 30 to 60 grams per meal to help with blood sugar control.   The need to take medication as prescribed, test blood sugar as directed, and to call between visits if there is a concern that blood sugar is uncontrolled is also discussed.   Joanna Brown is reminded of the importance of daily foot exam, annual eye examination, and good blood sugar, blood pressure and cholesterol control.  Diabetic Labs Latest Ref Rng 09/09/2015 06/08/2015 03/05/2015 12/03/2014 07/18/2014  HbA1c <5.7 % 7.4(H) 7.8(H) 7.0(H) 8.3(H) 8.0(H)  Microalbumin Not estab mg/dL - 0.4 - - -  Micro/Creat Ratio <30 mcg/mg creat - 3 - - -  Chol 125 - 200 mg/dL 186 - 138 173 162  HDL >=46 mg/dL 36(L) - 38(L) 37(L) 38(L)  Calc LDL <130 mg/dL 112 - 70 77 73  Triglycerides <150 mg/dL 190(H) - 148 293(H) 254(H)  Creatinine 0.60 - 0.93 mg/dL 1.18(H) 1.20(H) 1.00(H) 0.99 1.06   BP/Weight 09/15/2015 08/24/2015 07/30/2015 07/22/2015 06/17/2015 03/18/2015 123456  Systolic BP A999333 0000000 - - [id number] 0000000 [id number]  Diastolic BP 82 70 - - 70 80 80  Wt. (Lbs) 181 180 183.4 186 184 181 181  BMI 30.12 29.95 30.52 30.95 30.62 30.12 30.12   Foot/eye exam completion dates Latest Ref Rng 02/25/2015 12/04/2014  Eye Exam No Retinopathy No Retinopathy -  Foot Form Completion - - Done

## 2015-09-15 NOTE — Assessment & Plan Note (Signed)
Deteriorated and uncontrolled Hyperlipidemia:Low fat diet discussed and encouraged.   Lipid Panel  Lab Results  Component Value Date   CHOL 186 09/09/2015   HDL 36* 09/09/2015   LDLCALC 112 09/09/2015   TRIG 190* 09/09/2015   CHOLHDL 5.2* 09/09/2015      No med change Updated lab needed at/ before next visit.

## 2015-09-15 NOTE — Progress Notes (Signed)
Subjective:    Patient ID: Joanna Brown, female    DOB: Nov 12, 1940, 75 y.o.   MRN: [id number]  HPI   Joanna Brown     MRN: [id number]      DOB: [birth year]   HPI Joanna Brown is here for follow up and re-evaluation of chronic medical conditions, medication management and review of any available recent lab and radiology data.  Preventive health is updated, specifically  Cancer screening and Immunization.   Questions or concerns regarding consultations or procedures which the PT has had in the interim are  addressed. The PT denies any adverse reactions to current medications since the last visit.  There are no new concerns.  There are no specific complaints , concerned re lack of weight loss,  But has not committed to exercise, and will inc vegetable intake Denies polyuria, polydipsia, blurred vision , or hypoglycemic episodes.  ROS Denies recent fever or chills. Denies sinus pressure, nasal congestion, ear pain or sore throat. Denies chest congestion, productive cough or wheezing. Denies chest pains, palpitations and leg swelling Denies abdominal pain, nausea, vomiting,diarrhea or constipation.   Denies dysuria, frequency, hesitancy or incontinence. Denies joint pain, swelling and limitation in mobility. Denies headaches, seizures, numbness, or tingling. Denies depression, anxiety or insomnia. Denies skin break down or rash.   PE  BP 142/82 mmHg  Pulse 76  Resp 16  Ht 5\' 5"  (1.651 m)  Wt 181 lb (82.101 kg)  BMI 30.12 kg/m2  SpO2 96%  Patient alert and oriented and in no cardiopulmonary distress.  HEENT: No facial asymmetry, EOMI,   oropharynx pink and moist.  Neck supple no JVD, no mass.  Chest: Clear to auscultation bilaterally.  CVS: S1, S2 no murmurs, no S3.Regular rate.  ABD: Soft non tender.   Ext: No edema  MS: Adequate ROM spine, shoulders, hips and knees.  Skin: Intact, no ulcerations or rash noted.  Psych: Good eye contact, normal affect. Memory  intact not anxious or depressed appearing.  CNS: CN 2-12 intact, power,  normal throughout.no focal deficits noted.   Assessment & Plan   Hypertension goal BP (blood pressure) < 130/80 Uncontroled, no med change DASH diet and commitment to daily physical activity for a minimum of 30 minutes discussed and encouraged, as a part of hypertension management. The importance of attaining a healthy weight is also discussed.  BP/Weight 09/15/2015 08/24/2015 07/30/2015 07/22/2015 06/17/2015 03/18/2015 123456  Systolic BP A999333 0000000 - - [id number] 0000000 [id number]  Diastolic BP 82 70 - - 70 80 80  Wt. (Lbs) 181 180 183.4 186 184 181 181  BMI 30.12 29.95 30.52 30.95 30.62 30.12 30.12        Type 2 diabetes mellitus with chronic kidney disease Improved, continue with nutritional support Joanna Brown is reminded of the importance of commitment to daily physical activity for 30 minutes or more, as able and the need to limit carbohydrate intake to 30 to 60 grams per meal to help with blood sugar control.   The need to take medication as prescribed, test blood sugar as directed, and to call between visits if there is a concern that blood sugar is uncontrolled is also discussed.   Joanna Brown is reminded of the importance of daily foot exam, annual eye examination, and good blood sugar, blood pressure and cholesterol control.  Diabetic Labs Latest Ref Rng 09/09/2015 06/08/2015 03/05/2015 12/03/2014 07/18/2014  HbA1c <5.7 % 7.4(H) 7.8(H) 7.0(H) 8.3(H) 8.0(H)  Microalbumin Not estab mg/dL - 0.4 - - -  Micro/Creat Ratio <30 mcg/mg creat - 3 - - -  Chol 125 - 200 mg/dL 186 - 138 173 162  HDL >=46 mg/dL 36(L) - 38(L) 37(L) 38(L)  Calc LDL <130 mg/dL 112 - 70 77 73  Triglycerides <150 mg/dL 190(H) - 148 293(H) 254(H)  Creatinine 0.60 - 0.93 mg/dL 1.18(H) 1.20(H) 1.00(H) 0.99 1.06   BP/Weight 09/15/2015 08/24/2015 07/30/2015 07/22/2015 06/17/2015 03/18/2015 123456  Systolic BP A999333 0000000 - - [id number] 0000000 [id number]  Diastolic BP 82 70 - - 70 80 80    Wt. (Lbs) 181 180 183.4 186 184 181 181  BMI 30.12 29.95 30.52 30.95 30.62 30.12 30.12   Foot/eye exam completion dates Latest Ref Rng 02/25/2015 12/04/2014  Eye Exam No Retinopathy No Retinopathy -  Foot Form Completion - - Done         Hyperlipidemia LDL goal <100 Deteriorated and uncontrolled Hyperlipidemia:Low fat diet discussed and encouraged.   Lipid Panel  Lab Results  Component Value Date   CHOL 186 09/09/2015   HDL 36* 09/09/2015   LDLCALC 112 09/09/2015   TRIG 190* 09/09/2015   CHOLHDL 5.2* 09/09/2015      No med change Updated lab needed at/ before next visit.   Overweight (BMI 25.0-29.9) Deteriorated. Patient re-educated about  the importance of commitment to a  minimum of 150 minutes of exercise per week.  The importance of healthy food choices with portion control discussed. Encouraged to start a food diary, count calories and to consider  joining a support group. Sample diet sheets offered. Goals set by the patient for the next several months.   Weight /BMI 09/15/2015 08/24/2015 07/30/2015  WEIGHT 181 lb 180 lb 183 lb 6.4 oz  HEIGHT 5\' 5"  5\' 5"  5\' 5"   BMI 30.12 kg/m2 29.95 kg/m2 30.52 kg/m2    Current exercise per week 90 minutes.Needs to increase       Review of Systems     Objective:   Physical Exam        Assessment & Plan:

## 2015-09-15 NOTE — Assessment & Plan Note (Signed)
Deteriorated. Patient re-educated about  the importance of commitment to a  minimum of 150 minutes of exercise per week.  The importance of healthy food choices with portion control discussed. Encouraged to start a food diary, count calories and to consider  joining a support group. Sample diet sheets offered. Goals set by the patient for the next several months.   Weight /BMI 09/15/2015 08/24/2015 07/30/2015  WEIGHT 181 lb 180 lb 183 lb 6.4 oz  HEIGHT 5\' 5"  5\' 5"  5\' 5"   BMI 30.12 kg/m2 29.95 kg/m2 30.52 kg/m2    Current exercise per week 90 minutes.Needs to increase

## 2015-09-15 NOTE — Patient Instructions (Addendum)
Annual physical exam in 4.5 monhts  Congrats on improved blood sugar  Trade animal protein for plant protein, beans , lentils, split peas   Please work on good  health habits so that your health will improve. 1. Commitment to daily physical activity for 30 to 60  minutes, if you are able to do this.  2. Commitment to wise food choices. Aim for half of your  food intake to be vegetable and fruit, one quarter starchy foods, and one quarter protein. Try to eat on a regular schedule  3 meals per day, snacking between meals should be limited to vegetables or fruits or small portions of nuts. 64 ounces of water per day is generally recommended, unless you have specific health conditions, like heart failure or kidney failure where you will need to limit fluid intake.  3. Commitment to sufficient and a  good quality of physical and mental rest daily, generally between 6 to 8 hours per day.  WITH PERSISTANCE AND PERSEVERANCE, THE IMPOSSIBLE , BECOMES THE NORM!   Thanks for choosing [facility], we consider it a privelige to serve you.   Fasting lipid, cmp and EGFr and HBA1C, TSH and vit D in 4.5 mon th

## 2015-09-28 ENCOUNTER — Other Ambulatory Visit: Payer: Self-pay | Admitting: Family Medicine

## 2015-09-29 ENCOUNTER — Other Ambulatory Visit: Payer: Self-pay | Admitting: Family Medicine

## 2015-10-14 DIAGNOSIS — L602 Onychogryphosis: Secondary | ICD-10-CM | POA: Diagnosis not present

## 2015-10-14 DIAGNOSIS — E1351 Other specified diabetes mellitus with diabetic peripheral angiopathy without gangrene: Secondary | ICD-10-CM | POA: Diagnosis not present

## 2015-10-14 DIAGNOSIS — L84 Corns and callosities: Secondary | ICD-10-CM | POA: Diagnosis not present

## 2015-10-21 ENCOUNTER — Encounter: Payer: Medicare Other | Attending: Family Medicine | Admitting: Nutrition

## 2015-10-21 VITALS — Ht 65.0 in | Wt 179.0 lb

## 2015-10-21 DIAGNOSIS — E1165 Type 2 diabetes mellitus with hyperglycemia: Secondary | ICD-10-CM

## 2015-10-21 DIAGNOSIS — Z794 Long term (current) use of insulin: Secondary | ICD-10-CM | POA: Diagnosis not present

## 2015-10-21 DIAGNOSIS — E1122 Type 2 diabetes mellitus with diabetic chronic kidney disease: Secondary | ICD-10-CM | POA: Diagnosis not present

## 2015-10-21 DIAGNOSIS — E118 Type 2 diabetes mellitus with unspecified complications: Secondary | ICD-10-CM

## 2015-10-21 DIAGNOSIS — IMO0002 Reserved for concepts with insufficient information to code with codable children: Secondary | ICD-10-CM

## 2015-10-21 DIAGNOSIS — N189 Chronic kidney disease, unspecified: Secondary | ICD-10-CM | POA: Insufficient documentation

## 2015-10-21 NOTE — Progress Notes (Signed)
  Medical Nutrition Therapy:  Appt start time: 0800 and 0900 end time:   Assessment:  Primary concerns today: Diabetes Type 2.  No new A1C. Didn't bring meter. BS this am 124 mg/dl. FBS 90-148 mg/dl.  40 units of Levemir, Glipizie  5 mg and Metformin ER 1000 mg/d . Changes:  Trying to exercise more and eat more vegetables. Feels like BS drops between meals she reports--feels light headed, dizzy, gets unbalanced and has to eat something to feel better. Hasn't been testing blood sugars during those times.  Lost 4 lbs; 7 lbs in the last 2 months.Delfin Edis better.  Diet is much better-focusing on more fresh fruits and vegetables. She wants to come off of Glipizide due to low blood sugars between meals.   Preferred Learning Style:   No preference indicated   Learning Readiness:  Ready  Change in progress   MEDICATIONS:   DIETARY INTAKE:  24-hr recall:  B ( AM): Cherrios, 1 c, 1% milk,  OR oatmeal with egg, Snk ( AM):  L ( PM):   Stirfry veggies,  Beans pintos and water  Snk ( PM):  D ( PM): same as lunch Snk ( PM): none Beverages: water  Usual physical activity: walking 2 miles  Estimated energy needs: 1500 calories 170 g carbohydrates 112 g protein 42 g fat  Progress Towards Goal(s):  In progress.   Nutritional Diagnosis:  NB-1.1 Food and nutrition-related knowledge deficit As related to Diabetes.  As evidenced by A1C 7.8%.    Intervention:  Nutrition and Diabetes education provided on My Plate, CHO counting, meal planning, portion sizes, timing of meals, avoiding snacks between meals unless having a low blood sugar, target ranges for A1C and blood sugars, signs/symptoms and treatment of hyper/hypoglycemia, monitoring blood sugars, taking medications as prescribed, benefits of exercising 30 minutes per day and prevention of complications of DM.  Goals 1. Follow Plate Method 2.  Eat 2-3 carb choices per meals. 3. Be sure to eat before 7 pm.. 4. Choose protein or veggies for  snacks unless blood sugar is low.. 5. Test blood sugars before meals and bedtime for 1 week. 6. Talk to Dr. Moshe Cipro about stopping Glipizide. 7. Get A1C down to 7% in three months. 8. Lose 1-2 lbs per week.  Teaching Method Utilized:  Visual Auditory Hands on  Handouts given during visit include:  The Plate Method  Meal Plan Card  Diabetes Instructions.   Barriers to learning/adherence to lifestyle change: None  Demonstrated degree of understanding via:  Teach Back   Monitoring/Evaluation:  Dietary intake, exercise, meal planning, SBG, and body weight in 1 week. Will  Have her check blood sugar before meals and bedtime for 1 week and keep food journal and bring at next visit and will evaluate BS readings and food journal at that time.     Since she is on Levemir, recommend to discontinue Glipizide due to chasing low blood sugars and a having to eat to bring them up.

## 2015-10-21 NOTE — Patient Instructions (Signed)
Goals 1. Follow Plate Method 2.  Eat 2-3 carb choices per meals. 3. Be sure to eat before 7 pm.. 4. Choose protein or veggies for snacks unless blood sugar is low.. 5. Test blood sugars before meals and bedtime for 1 week. 6. Talk to Dr. Moshe Cipro about stopping Glipizide. 7. Get A1C down to 7% in three months. 8. Lose 1-2 lbs per week.

## 2015-10-28 DIAGNOSIS — H401131 Primary open-angle glaucoma, bilateral, mild stage: Secondary | ICD-10-CM | POA: Diagnosis not present

## 2015-12-01 ENCOUNTER — Other Ambulatory Visit: Payer: Self-pay | Admitting: Family Medicine

## 2015-12-02 DIAGNOSIS — H401131 Primary open-angle glaucoma, bilateral, mild stage: Secondary | ICD-10-CM | POA: Diagnosis not present

## 2015-12-15 ENCOUNTER — Other Ambulatory Visit: Payer: Self-pay | Admitting: Family Medicine

## 2015-12-16 DIAGNOSIS — E1351 Other specified diabetes mellitus with diabetic peripheral angiopathy without gangrene: Secondary | ICD-10-CM | POA: Diagnosis not present

## 2015-12-16 DIAGNOSIS — L602 Onychogryphosis: Secondary | ICD-10-CM | POA: Diagnosis not present

## 2015-12-30 DIAGNOSIS — E559 Vitamin D deficiency, unspecified: Secondary | ICD-10-CM | POA: Diagnosis not present

## 2015-12-30 DIAGNOSIS — E785 Hyperlipidemia, unspecified: Secondary | ICD-10-CM | POA: Diagnosis not present

## 2015-12-30 DIAGNOSIS — N189 Chronic kidney disease, unspecified: Secondary | ICD-10-CM | POA: Diagnosis not present

## 2015-12-30 DIAGNOSIS — Z794 Long term (current) use of insulin: Secondary | ICD-10-CM | POA: Diagnosis not present

## 2015-12-30 DIAGNOSIS — E1122 Type 2 diabetes mellitus with diabetic chronic kidney disease: Secondary | ICD-10-CM | POA: Diagnosis not present

## 2015-12-30 DIAGNOSIS — E1129 Type 2 diabetes mellitus with other diabetic kidney complication: Secondary | ICD-10-CM | POA: Diagnosis not present

## 2015-12-30 DIAGNOSIS — I10 Essential (primary) hypertension: Secondary | ICD-10-CM | POA: Diagnosis not present

## 2015-12-31 LAB — COMPLETE METABOLIC PANEL WITH GFR
ALT: 27 U/L (ref 6–29)
AST: 26 U/L (ref 10–35)
Albumin: 4.2 g/dL (ref 3.6–5.1)
Alkaline Phosphatase: 50 U/L (ref 33–130)
BUN: 16 mg/dL (ref 7–25)
CO2: 27 mmol/L (ref 20–31)
Calcium: 10.1 mg/dL (ref 8.6–10.4)
Chloride: 96 mmol/L — ABNORMAL LOW (ref 98–110)
Creat: 1.15 mg/dL — ABNORMAL HIGH (ref 0.60–0.93)
GFR, Est African American: 54 mL/min — ABNORMAL LOW (ref >=60)
GFR, Est Non African American: 47 mL/min — ABNORMAL LOW (ref >=60)
Glucose, Bld: 168 mg/dL — ABNORMAL HIGH (ref 65–99)
Potassium: 4.7 mmol/L (ref 3.5–5.3)
Sodium: 134 mmol/L — ABNORMAL LOW (ref 135–146)
Total Bilirubin: 0.5 mg/dL (ref 0.2–1.2)
Total Protein: 7.4 g/dL (ref 6.1–8.1)

## 2015-12-31 LAB — HEMOGLOBIN A1C
Hgb A1c MFr Bld: 7.9 % — ABNORMAL HIGH (ref <5.7)
Mean Plasma Glucose: 180 mg/dL

## 2015-12-31 LAB — LIPID PANEL
Cholesterol: 204 mg/dL — ABNORMAL HIGH (ref 125–200)
HDL: 42 mg/dL — ABNORMAL LOW (ref >=46)
LDL Cholesterol: 114 mg/dL (ref <130)
Total CHOL/HDL Ratio: 4.9 Ratio (ref <=5.0)
Triglycerides: 238 mg/dL — ABNORMAL HIGH (ref <150)
VLDL: 48 mg/dL — ABNORMAL HIGH (ref <30)

## 2015-12-31 LAB — TSH: TSH: 3.26 mIU/L

## 2015-12-31 LAB — VITAMIN D 25 HYDROXY (VIT D DEFICIENCY, FRACTURES): Vit D, 25-Hydroxy: 34 ng/mL (ref 30–100)

## 2016-01-07 ENCOUNTER — Encounter: Payer: Self-pay | Admitting: Family Medicine

## 2016-01-07 ENCOUNTER — Encounter (INDEPENDENT_AMBULATORY_CARE_PROVIDER_SITE_OTHER): Payer: Self-pay

## 2016-01-07 ENCOUNTER — Ambulatory Visit (INDEPENDENT_AMBULATORY_CARE_PROVIDER_SITE_OTHER): Payer: Medicare Other | Admitting: Family Medicine

## 2016-01-07 VITALS — BP 130/72 | HR 70 | Resp 18 | Ht 65.0 in | Wt 180.0 lb

## 2016-01-07 DIAGNOSIS — E785 Hyperlipidemia, unspecified: Secondary | ICD-10-CM | POA: Diagnosis not present

## 2016-01-07 DIAGNOSIS — Z794 Long term (current) use of insulin: Secondary | ICD-10-CM

## 2016-01-07 DIAGNOSIS — E1122 Type 2 diabetes mellitus with diabetic chronic kidney disease: Secondary | ICD-10-CM | POA: Diagnosis not present

## 2016-01-07 DIAGNOSIS — E663 Overweight: Secondary | ICD-10-CM

## 2016-01-07 DIAGNOSIS — Z1211 Encounter for screening for malignant neoplasm of colon: Secondary | ICD-10-CM | POA: Diagnosis not present

## 2016-01-07 DIAGNOSIS — I10 Essential (primary) hypertension: Secondary | ICD-10-CM | POA: Diagnosis not present

## 2016-01-07 LAB — HEMOCCULT GUIAC POC 1CARD (OFFICE): Fecal Occult Blood, POC: NEGATIVE

## 2016-01-07 NOTE — Progress Notes (Signed)
Joanna Brown     MRN: [id number]      DOB: 02-06-1941   HPI Ms. Mccampbell is here for follow up and re-evaluation of chronic medical conditions, medication management and review of any available recent lab and radiology data.  Preventive health is updated, specifically  Cancer screening and Immunization.   Questions or concerns regarding consultations or procedures which the PT has had in the interim are  addressed. The PT denies any adverse reactions to current medications since the last visit.  Has been eating out more often in past 1 month with elevated blood sugars C/o low sugar to 80 after exercise , but more often than not , too high, has appt with nutrition educator which she needs Exercise  Every day   ROS Denies recent fever or chills. Denies sinus pressure, nasal congestion, ear pain or sore throat. Denies chest congestion, productive cough or wheezing. Denies chest pains, palpitations and leg swelling Denies abdominal pain, nausea, vomiting,diarrhea or constipation.   Denies dysuria, frequency, hesitancy or incontinence. Denies joint pain, swelling and limitation in mobility. Denies headaches, seizures, numbness, or tingling. Denies depression, anxiety or insomnia. Denies skin break down or rash.   PE  BP 130/72   Pulse 70   Resp 18   Ht 5\' 5"  (1.651 m)   Wt 180 lb (81.6 kg)   SpO2 97%   BMI 29.95 kg/m   Patient alert and oriented and in no cardiopulmonary distress.  HEENT: No facial asymmetry, EOMI,   oropharynx pink and moist.  Neck supple no JVD, no mass.  Chest: Clear to auscultation bilaterally.  CVS: S1, S2 systolic  murmur, no S3.Regular rate.  ABD: Soft non tender.  Rectal: no mass, heme negative stool Ext: No edema  MS: Adequate ROM spine, shoulders, hips and knees.  Skin: Intact, no ulcerations or rash noted.  Psych: Good eye contact, normal affect. Memory intact not anxious or depressed appearing.  CNS: CN 2-12 intact, power,  normal  throughout.no focal deficits noted.   Assessment & Plan  Essential hypertension Controlled, no change in medication DASH diet and commitment to daily physical activity for a minimum of 30 minutes discussed and encouraged, as a part of hypertension management. The importance of attaining a healthy weight is also discussed.  BP/Weight 01/07/2016 10/21/2015 09/15/2015 08/24/2015 07/30/2015 Q000111Q 0000000  Systolic BP [id number] - A999333 0000000 - - [id number]  Diastolic BP 72 - 82 70 - - 70  Wt. (Lbs) 180 179 181 180 183.4 186 184  BMI 29.95 29.79 30.12 29.95 30.52 30.95 30.62       Type 2 diabetes mellitus with chronic kidney disease Deteriorated, needs diabetic education, has upcoming appt, some done briefly at visit today also Ms. Perdomo is reminded of the importance of commitment to daily physical activity for 30 minutes or more, as able and the need to limit carbohydrate intake to 30 to 60 grams per meal to help with blood sugar control.   The need to take medication as prescribed, test blood sugar as directed, and to call between visits if there is a concern that blood sugar is uncontrolled is also discussed.   Ms. Valdes is reminded of the importance of daily foot exam, annual eye examination, and good blood sugar, blood pressure and cholesterol control.  Diabetic Labs Latest Ref Rng & Units 12/30/2015 09/09/2015 06/08/2015 03/05/2015 12/03/2014  HbA1c <5.7 % 7.9(H) 7.4(H) 7.8(H) 7.0(H) 8.3(H)  Microalbumin Not estab mg/dL - - 0.4 - -  Micro/Creat Ratio <  30 mcg/mg creat - - 3 - -  Chol 125 - 200 mg/dL 204(H) 186 - 138 173  HDL >=46 mg/dL 42(L) 36(L) - 38(L) 37(L)  Calc LDL <130 mg/dL 114 112 - 70 77  Triglycerides <150 mg/dL 238(H) 190(H) - 148 293(H)  Creatinine 0.60 - 0.93 mg/dL 1.15(H) 1.18(H) 1.20(H) 1.00(H) 0.99   BP/Weight 01/07/2016 10/21/2015 09/15/2015 08/24/2015 07/30/2015 Q000111Q 0000000  Systolic BP [id number] - A999333 0000000 - - [id number]  Diastolic BP 72 - 82 70 - - 70  Wt. (Lbs) 180 179 181 180 183.4 186  184  BMI 29.95 29.79 30.12 29.95 30.52 30.95 30.62   Foot/eye exam completion dates Latest Ref Rng & Units 01/07/2016 02/25/2015  Eye Exam No Retinopathy - No Retinopathy  Foot Form Completion - Done -        Hyperlipidemia LDL goal <100 Deteriorated Hyperlipidemia:Low fat diet discussed and encouraged.   Lipid Panel  Lab Results  Component Value Date   CHOL 204 (H) 12/30/2015   HDL 42 (L) 12/30/2015   LDLCALC 114 12/30/2015   TRIG 238 (H) 12/30/2015   CHOLHDL 4.9 12/30/2015     Updated lab needed at/ before next visit.   Overweight (BMI 25.0-29.9) Deteriorated. Patient re-educated about  the importance of commitment to a  minimum of 150 minutes of exercise per week.  The importance of healthy food choices with portion control discussed. Encouraged to start a food diary, count calories and to consider  joining a support group. Sample diet sheets offered. Goals set by the patient for the next several months.   Weight /BMI 01/07/2016 10/21/2015 09/15/2015  WEIGHT 180 lb 179 lb 181 lb  HEIGHT 5\' 5"  5\' 5"  5\' 5"   BMI 29.95 kg/m2 29.79 kg/m2 30.12 kg/m2

## 2016-01-07 NOTE — Assessment & Plan Note (Signed)
Deteriorated. Patient re-educated about  the importance of commitment to a  minimum of 150 minutes of exercise per week.  The importance of healthy food choices with portion control discussed. Encouraged to start a food diary, count calories and to consider  joining a support group. Sample diet sheets offered. Goals set by the patient for the next several months.   Weight /BMI 01/07/2016 10/21/2015 09/15/2015  WEIGHT 180 lb 179 lb 181 lb  HEIGHT 5\' 5"  5\' 5"  5\' 5"   BMI 29.95 kg/m2 29.79 kg/m2 30.12 kg/m2

## 2016-01-07 NOTE — Assessment & Plan Note (Signed)
Deteriorated Hyperlipidemia:Low fat diet discussed and encouraged.   Lipid Panel  Lab Results  Component Value Date   CHOL 204 (H) 12/30/2015   HDL 42 (L) 12/30/2015   LDLCALC 114 12/30/2015   TRIG 238 (H) 12/30/2015   CHOLHDL 4.9 12/30/2015     Updated lab needed at/ before next visit.

## 2016-01-07 NOTE — Patient Instructions (Addendum)
F/u in 14 weeks, call if you need me before  Reconsider flu vaccine, and come get it!  Need to change eating habits, and I know you will   Foot exam shows slight reduction in sensation, show check feet daily  Cholesterol and sugar have increased , need to meet with nutrition educator  Thank you  for choosing [facility]. We consider it a privelige to serve you.  Delivering excellent health care in a caring and  compassionate way is our goal.  Partnering with you,  so that together we can achieve this goal is our strategy.    Fasting lipid, cmp and EGFr, hBA1C and cBC in 3.5 month, 5 days before appt  Please work on good  health habits so that your health will improve. 1. Commitment to daily physical activity for 30 to 60  minutes, if you are able to do this.  2. Commitment to wise food choices. Aim for half of your  food intake to be vegetable and fruit, one quarter starchy foods, and one quarter protein. Try to eat on a regular schedule  3 meals per day, snacking between meals should be limited to vegetables or fruits or small portions of nuts. 64 ounces of water per day is generally recommended, unless you have specific health conditions, like heart failure or kidney failure where you will need to limit fluid intake.  3. Commitment to sufficient and a  good quality of physical and mental rest daily, generally between 6 to 8 hours per day.  WITH PERSISTANCE AND PERSEVERANCE, THE IMPOSSIBLE , BECOMES THE NORM!

## 2016-01-07 NOTE — Assessment & Plan Note (Signed)
Deteriorated, needs diabetic education, has upcoming appt, some done briefly at visit today also Joanna Brown is reminded of the importance of commitment to daily physical activity for 30 minutes or more, as able and the need to limit carbohydrate intake to 30 to 60 grams per meal to help with blood sugar control.   The need to take medication as prescribed, test blood sugar as directed, and to call between visits if there is a concern that blood sugar is uncontrolled is also discussed.   Joanna Brown is reminded of the importance of daily foot exam, annual eye examination, and good blood sugar, blood pressure and cholesterol control.  Diabetic Labs Latest Ref Rng & Units 12/30/2015 09/09/2015 06/08/2015 03/05/2015 12/03/2014  HbA1c <5.7 % 7.9(H) 7.4(H) 7.8(H) 7.0(H) 8.3(H)  Microalbumin Not estab mg/dL - - 0.4 - -  Micro/Creat Ratio <30 mcg/mg creat - - 3 - -  Chol 125 - 200 mg/dL 204(H) 186 - 138 173  HDL >=46 mg/dL 42(L) 36(L) - 38(L) 37(L)  Calc LDL <130 mg/dL 114 112 - 70 77  Triglycerides <150 mg/dL 238(H) 190(H) - 148 293(H)  Creatinine 0.60 - 0.93 mg/dL 1.15(H) 1.18(H) 1.20(H) 1.00(H) 0.99   BP/Weight 01/07/2016 10/21/2015 09/15/2015 08/24/2015 07/30/2015 Q000111Q 0000000  Systolic BP AB-123456789 - A999333 0000000 - - [id number]  Diastolic BP 72 - 82 70 - - 70  Wt. (Lbs) 180 179 181 180 183.4 186 184  BMI 29.95 29.79 30.12 29.95 30.52 30.95 30.62   Foot/eye exam completion dates Latest Ref Rng & Units 01/07/2016 02/25/2015  Eye Exam No Retinopathy - No Retinopathy  Foot Form Completion - Done -

## 2016-01-07 NOTE — Assessment & Plan Note (Signed)
Controlled, no change in medication DASH diet and commitment to daily physical activity for a minimum of 30 minutes discussed and encouraged, as a part of hypertension management. The importance of attaining a healthy weight is also discussed.  BP/Weight 01/07/2016 10/21/2015 09/15/2015 08/24/2015 07/30/2015 Q000111Q 0000000  Systolic BP [id number] - A999333 0000000 - - [id number]  Diastolic BP 72 - 82 70 - - 70  Wt. (Lbs) 180 179 181 180 183.4 186 184  BMI 29.95 29.79 30.12 29.95 30.52 30.95 30.62

## 2016-02-02 ENCOUNTER — Encounter: Payer: Self-pay | Admitting: Family Medicine

## 2016-02-03 ENCOUNTER — Telehealth: Payer: Self-pay

## 2016-02-03 DIAGNOSIS — E1122 Type 2 diabetes mellitus with diabetic chronic kidney disease: Secondary | ICD-10-CM

## 2016-02-03 DIAGNOSIS — Z794 Long term (current) use of insulin: Secondary | ICD-10-CM

## 2016-02-03 NOTE — Telephone Encounter (Signed)
-----   Message from Arlana Lindau, RD sent at 02/03/2016  4:05 PM EDT ----- Regarding: referral Hi Elvis Laufer,  I need a new referral on her also please for Dm education. Just put in in [place]

## 2016-02-04 ENCOUNTER — Ambulatory Visit: Payer: Self-pay | Admitting: Nutrition

## 2016-02-17 ENCOUNTER — Encounter: Payer: Self-pay | Admitting: Nutrition

## 2016-02-17 ENCOUNTER — Encounter: Payer: Medicare Other | Attending: Family Medicine | Admitting: Nutrition

## 2016-02-17 VITALS — Ht 65.0 in | Wt 184.0 lb

## 2016-02-17 DIAGNOSIS — E1122 Type 2 diabetes mellitus with diabetic chronic kidney disease: Secondary | ICD-10-CM | POA: Insufficient documentation

## 2016-02-17 DIAGNOSIS — E118 Type 2 diabetes mellitus with unspecified complications: Secondary | ICD-10-CM

## 2016-02-17 DIAGNOSIS — IMO0002 Reserved for concepts with insufficient information to code with codable children: Secondary | ICD-10-CM

## 2016-02-17 DIAGNOSIS — Z713 Dietary counseling and surveillance: Secondary | ICD-10-CM | POA: Diagnosis not present

## 2016-02-17 DIAGNOSIS — Z794 Long term (current) use of insulin: Secondary | ICD-10-CM | POA: Diagnosis not present

## 2016-02-17 DIAGNOSIS — Z6830 Body mass index (BMI) 30.0-30.9, adult: Secondary | ICD-10-CM | POA: Insufficient documentation

## 2016-02-17 DIAGNOSIS — N189 Chronic kidney disease, unspecified: Secondary | ICD-10-CM | POA: Diagnosis not present

## 2016-02-17 DIAGNOSIS — E669 Obesity, unspecified: Secondary | ICD-10-CM

## 2016-02-17 DIAGNOSIS — E1165 Type 2 diabetes mellitus with hyperglycemia: Secondary | ICD-10-CM

## 2016-02-17 NOTE — Progress Notes (Signed)
  Medical Nutrition Therapy:  Appt start time: 0800 and 0900 end time:   Assessment:  Primary concerns today: Diabetes Type 2.  40 units of Levemir; Glipizide 5 mg, Metformin 1000 mg per day.  Sees Dr. Moshe Cipro for PCP. Meter brought.  7 dayd avg 226 mg/dl 14 day 225 mg/dl and 30 day 193 mg/dl. Celebrated her Rudene Anda in August and says she has been over doing it ever since and realizes her BS are high. She is committed to getting back on track today. Still Walks 2 miles per day on treadmill. Realizes she gets better workout if walks outside. [facility].   Admits to snacking and eating more carbs than she should and not enough water or vegetables.   Preferred Learning Style:   No preference indicated   Learning Readiness:  Ready  Change in progress   MEDICATIONS:   DIETARY INTAKE:  24-hr recall:  B ( AM): Oatmeal 1 cup, 1 slice ww toast, coffee,  Sometimes fruit and occasionally eggs, 1 slice bacon and 1 slice bacon. Water  Snk ( AM): snacks-- cutting it out now. L ( PM):   Kale, rice 1 cup, , biscuit  Snk ( PM): was eating snacks D  Baked chicken, toss salad,water, Snk ( PM): none Beverages: water  Usual physical activity: walking 2 miles  Estimated energy needs: 1500 calories 170 g carbohydrates 112 g protein 42 g fat  Progress Towards Goal(s):  In progress.   Nutritional Diagnosis:  NB-1.1 Food and nutrition-related knowledge deficit As related to Diabetes.  As evidenced by A1C 7.8%.    Intervention:  Nutrition and Diabetes education provided on My Plate, CHO counting, meal planning, portion sizes, timing of meals, avoiding snacks between meals unless having a low blood sugar, target ranges for A1C and blood sugars, signs/symptoms and treatment of hyper/hypoglycemia, monitoring blood sugars, taking medications as prescribed, benefits of exercising 30 minutes per day and prevention of complications of DM.  Goals 1. Follow Plate Method 2.  Eat 2-3 carb  choices per meals. 3. Be sure to eat before 7 pm.. 4. Choose protein or veggies for snacks unless blood sugar is low.. 5. Test blood sugars before meals and bedtime for 1 week. 6. Talk to Dr. Moshe Cipro about stopping Glipizide. 7. Get A1C down to 7% in three months. 8. Lose 1-2 lbs per week.  Teaching Method Utilized:  Visual Auditory Hands on  Handouts given during visit include:  The Plate Method  Meal Plan Card  Diabetes Instructions.   Barriers to learning/adherence to lifestyle change: None  Demonstrated degree of understanding via:  Teach Back   Monitoring/Evaluation:  Dietary intake, exercise, meal planning, SBG, and body weight in 1 week. Will  Have her check blood sugar before meals and bedtime for 1 week and keep food journal and bring at next visit and will evaluate BS readings and food journal at that time.     Since she is on Levemir, recommend to discontinue Glipizide due to chasing low blood sugars and a having to eat to bring them up.

## 2016-02-17 NOTE — Patient Instructions (Signed)
Goals 1. Eats 30 carbs per meal 2. Cut out snacks between meals 3. Drink water only 4. Increase low carb vegetables 5. Incline treadmill to a "3" for greater workout if walking indoors Or walk more outdoors for exercise. Lose 2 lbs per month or more Get Avg to 154 mg/dl.

## 2016-02-23 DIAGNOSIS — E1351 Other specified diabetes mellitus with diabetic peripheral angiopathy without gangrene: Secondary | ICD-10-CM | POA: Diagnosis not present

## 2016-02-23 DIAGNOSIS — L602 Onychogryphosis: Secondary | ICD-10-CM | POA: Diagnosis not present

## 2016-02-24 ENCOUNTER — Other Ambulatory Visit: Payer: Self-pay | Admitting: Family Medicine

## 2016-02-24 DIAGNOSIS — Z961 Presence of intraocular lens: Secondary | ICD-10-CM | POA: Diagnosis not present

## 2016-02-24 DIAGNOSIS — H401131 Primary open-angle glaucoma, bilateral, mild stage: Secondary | ICD-10-CM | POA: Diagnosis not present

## 2016-02-24 DIAGNOSIS — E119 Type 2 diabetes mellitus without complications: Secondary | ICD-10-CM | POA: Diagnosis not present

## 2016-02-24 DIAGNOSIS — Z794 Long term (current) use of insulin: Secondary | ICD-10-CM | POA: Diagnosis not present

## 2016-02-24 LAB — HM DIABETES EYE EXAM

## 2016-02-25 ENCOUNTER — Ambulatory Visit: Payer: Self-pay | Admitting: Nutrition

## 2016-02-29 ENCOUNTER — Encounter: Payer: Self-pay | Admitting: Nutrition

## 2016-02-29 ENCOUNTER — Encounter: Payer: Medicare Other | Admitting: Nutrition

## 2016-02-29 VITALS — Ht 65.0 in | Wt 183.0 lb

## 2016-02-29 DIAGNOSIS — E669 Obesity, unspecified: Secondary | ICD-10-CM

## 2016-02-29 DIAGNOSIS — Z6830 Body mass index (BMI) 30.0-30.9, adult: Secondary | ICD-10-CM | POA: Diagnosis not present

## 2016-02-29 DIAGNOSIS — Z794 Long term (current) use of insulin: Secondary | ICD-10-CM | POA: Diagnosis not present

## 2016-02-29 DIAGNOSIS — E1122 Type 2 diabetes mellitus with diabetic chronic kidney disease: Secondary | ICD-10-CM | POA: Diagnosis not present

## 2016-02-29 DIAGNOSIS — E118 Type 2 diabetes mellitus with unspecified complications: Secondary | ICD-10-CM

## 2016-02-29 DIAGNOSIS — Z713 Dietary counseling and surveillance: Secondary | ICD-10-CM | POA: Diagnosis not present

## 2016-02-29 DIAGNOSIS — N189 Chronic kidney disease, unspecified: Secondary | ICD-10-CM | POA: Diagnosis not present

## 2016-02-29 NOTE — Progress Notes (Signed)
  Medical Nutrition Therapy:  Appt start time: 0800 and 0900 end time:   Assessment:  Primary concerns today: Diabetes Type 2.  Walking 45 minutes most days of week. Forgot her meter. BS log brought in. Testing randomly. BS are inconsistent. She is skipping some meals. Doesn't get hungry at times. Taking 40 units of Levemir daily at night. Still on 5 mg of Glipizide and Metformin. Lost 1 lbs since last visit.      Diet is still inconsistent in carbs, protein and low carb vegetables. Drinking water. Diet is high in sodium from processed meats and cheese.    Preferred Learning Style:   No preference indicated   Learning Readiness:  Ready  Change in progress   MEDICATIONS:   DIETARY INTAKE:  24-hr recall:  B ( AM): Cheese on toast 1 slice, 1 sausage and 0000000  applesauce Skipped lunch/ Snk ( AM): snacks-- cutting it out now. L ( PM):    D  Baked chicken, toss salad,water, Snk ( PM): none Beverages: water  Usual physical activity: walking 2 miles  Estimated energy needs: 1500 calories 170 g carbohydrates 112 g protein 42 g fat  Progress Towards Goal(s):  In progress.   Nutritional Diagnosis:  NB-1.1 Food and nutrition-related knowledge deficit As related to Diabetes.  As evidenced by A1C 7.8%.    Intervention:  Nutrition and Diabetes education provided on My Plate, CHO counting, meal planning, portion sizes, timing of meals, avoiding snacks between meals unless having a low blood sugar, target ranges for A1C and blood sugars, signs/symptoms and treatment of hyper/hypoglycemia, monitoring blood sugars, taking medications as prescribed, benefits of exercising 30 minutes per day and prevention of complications of DM.  Marland KitchenGoals 1. Make sure meals on times. 2. Follow My Plate at meal times. 3. Lose 1 lb per week 4. Get A1C down to 7%. 5. Do not skip meals 6. Talk to Dr. Moshe Cipro about stopping Glipizide. Increase exercise to 46-60 minutes 4 days a week  Teaching Method  Utilized:  Visual Auditory Hands on  Handouts given during visit include:  The Plate Method  Meal Plan Card  Diabetes Instructions.   Barriers to learning/adherence to lifestyle change: None  Demonstrated degree of understanding via:  Teach Back   Monitoring/Evaluation:  Dietary intake, exercise, meal planning, SBG, and body weight in 1 week. Will  Have her check blood sugar before meals and bedtime for 1 week and keep food journal and bring at next visit and will evaluate BS readings and food journal at that time.     Since she is on Levemir, recommend to discontinue Glipizide due to chasing low blood sugars and a having to eat to bring them up.

## 2016-02-29 NOTE — Patient Instructions (Addendum)
Goals 1. Make sure meals on times. 2. Follow My Plate at meal times. 3. Lose 1 lb per week 4. Get A1C down to 7%. 5. Do not skip meals 6. Talk to Dr. Moshe Cipro about stopping Glipizide. Increase exercise to 46-60 minutes 4 days a week

## 2016-03-15 ENCOUNTER — Other Ambulatory Visit: Payer: Self-pay | Admitting: Family Medicine

## 2016-04-03 ENCOUNTER — Other Ambulatory Visit: Payer: Self-pay | Admitting: Family Medicine

## 2016-04-04 DIAGNOSIS — I10 Essential (primary) hypertension: Secondary | ICD-10-CM | POA: Diagnosis not present

## 2016-04-04 DIAGNOSIS — E785 Hyperlipidemia, unspecified: Secondary | ICD-10-CM | POA: Diagnosis not present

## 2016-04-04 DIAGNOSIS — E1122 Type 2 diabetes mellitus with diabetic chronic kidney disease: Secondary | ICD-10-CM | POA: Diagnosis not present

## 2016-04-04 DIAGNOSIS — Z794 Long term (current) use of insulin: Secondary | ICD-10-CM | POA: Diagnosis not present

## 2016-04-04 LAB — CBC
HCT: 37.7 % (ref 35.0–45.0)
Hemoglobin: 12.9 g/dL (ref 11.7–15.5)
MCH: 28.7 pg (ref 27.0–33.0)
MCHC: 34.2 g/dL (ref 32.0–36.0)
MCV: 84 fL (ref 80.0–100.0)
MPV: 9.9 fL (ref 7.5–12.5)
Platelets: 231 10*3/uL (ref 140–400)
RBC: 4.49 MIL/uL (ref 3.80–5.10)
RDW: 14.4 % (ref 11.0–15.0)
WBC: 5.7 10*3/uL (ref 3.8–10.8)

## 2016-04-04 LAB — COMPLETE METABOLIC PANEL WITH GFR
ALT: 31 U/L — ABNORMAL HIGH (ref 6–29)
AST: 31 U/L (ref 10–35)
Albumin: 4.2 g/dL (ref 3.6–5.1)
Alkaline Phosphatase: 48 U/L (ref 33–130)
BUN: 17 mg/dL (ref 7–25)
CO2: 30 mmol/L (ref 20–31)
Calcium: 9.8 mg/dL (ref 8.6–10.4)
Chloride: 98 mmol/L (ref 98–110)
Creat: 1.02 mg/dL — ABNORMAL HIGH (ref 0.60–0.93)
GFR, Est African American: 63 mL/min (ref >=60)
GFR, Est Non African American: 54 mL/min — ABNORMAL LOW (ref >=60)
Glucose, Bld: 177 mg/dL — ABNORMAL HIGH (ref 65–99)
Potassium: 4.4 mmol/L (ref 3.5–5.3)
Sodium: 136 mmol/L (ref 135–146)
Total Bilirubin: 0.5 mg/dL (ref 0.2–1.2)
Total Protein: 7.4 g/dL (ref 6.1–8.1)

## 2016-04-04 LAB — LIPID PANEL
Cholesterol: 223 mg/dL — ABNORMAL HIGH (ref <200)
HDL: 38 mg/dL — ABNORMAL LOW (ref >50)
LDL Cholesterol: 125 mg/dL — ABNORMAL HIGH (ref <100)
Total CHOL/HDL Ratio: 5.9 Ratio — ABNORMAL HIGH (ref <5.0)
Triglycerides: 300 mg/dL — ABNORMAL HIGH (ref <150)
VLDL: 60 mg/dL — ABNORMAL HIGH (ref <30)

## 2016-04-05 LAB — HEMOGLOBIN A1C
Hgb A1c MFr Bld: 8.1 % — ABNORMAL HIGH (ref <5.7)
Mean Plasma Glucose: 186 mg/dL

## 2016-04-14 ENCOUNTER — Other Ambulatory Visit: Payer: Self-pay | Admitting: Family Medicine

## 2016-04-18 ENCOUNTER — Ambulatory Visit (INDEPENDENT_AMBULATORY_CARE_PROVIDER_SITE_OTHER): Payer: Medicare Other | Admitting: Family Medicine

## 2016-04-18 ENCOUNTER — Encounter: Payer: Self-pay | Admitting: Family Medicine

## 2016-04-18 ENCOUNTER — Other Ambulatory Visit: Payer: Self-pay

## 2016-04-18 VITALS — BP 150/82 | HR 80 | Resp 16 | Ht 65.0 in | Wt 185.0 lb

## 2016-04-18 DIAGNOSIS — E6609 Other obesity due to excess calories: Secondary | ICD-10-CM | POA: Diagnosis not present

## 2016-04-18 DIAGNOSIS — I10 Essential (primary) hypertension: Secondary | ICD-10-CM

## 2016-04-18 DIAGNOSIS — Z794 Long term (current) use of insulin: Secondary | ICD-10-CM | POA: Diagnosis not present

## 2016-04-18 DIAGNOSIS — E785 Hyperlipidemia, unspecified: Secondary | ICD-10-CM | POA: Diagnosis not present

## 2016-04-18 DIAGNOSIS — Z6830 Body mass index (BMI) 30.0-30.9, adult: Secondary | ICD-10-CM

## 2016-04-18 DIAGNOSIS — E1122 Type 2 diabetes mellitus with diabetic chronic kidney disease: Secondary | ICD-10-CM

## 2016-04-18 MED ORDER — INSULIN DETEMIR 100 UNIT/ML FLEXPEN: 45.0000 [IU] | Freq: Every day | SUBCUTANEOUS | 5 refills | Status: DC

## 2016-04-18 MED ORDER — PRAVASTATIN SODIUM 80 MG PO TABS: 80.0000 mg | ORAL_TABLET | Freq: Every day | ORAL | 1 refills | Status: DC

## 2016-04-18 MED ORDER — TRIAMTERENE-HCTZ 75-50 MG PO TABS: 1.0000 | ORAL_TABLET | Freq: Every day | ORAL | 1 refills | Status: DC

## 2016-04-18 MED ORDER — METFORMIN HCL 1000 MG PO TABS: 1000.0000 mg | ORAL_TABLET | Freq: Every day | ORAL | 1 refills | Status: DC

## 2016-04-18 MED ORDER — AMLODIPINE BESYLATE 10 MG PO TABS: 10.0000 mg | ORAL_TABLET | Freq: Every morning | ORAL | 1 refills | Status: DC

## 2016-04-18 MED ORDER — BENAZEPRIL HCL 40 MG PO TABS: 40.0000 mg | ORAL_TABLET | Freq: Every day | ORAL | 1 refills | Status: DC

## 2016-04-18 MED ORDER — GLIPIZIDE ER 5 MG PO TB24: 5.0000 mg | ORAL_TABLET | Freq: Every day | ORAL | 1 refills | Status: DC

## 2016-04-18 NOTE — Assessment & Plan Note (Signed)
Uncontrolled with elevated blood pressure, no med change, lifestyle and dietary change with weight loss at this time DASH diet and commitment to daily physical activity for a minimum of 30 minutes discussed and encouraged, as a part of hypertension management. The importance of attaining a healthy weight is also discussed.  BP/Weight 04/18/2016 02/29/2016 02/17/2016 01/07/2016 10/21/2015 09/15/2015 Q000111Q  Systolic BP Q000111Q - - [id number] - A999333 0000000  Diastolic BP 82 - - 72 - 82 70  Wt. (Lbs) 185 183 184 180 179 181 180  BMI 30.79 30.45 30.62 29.95 29.79 30.12 29.95

## 2016-04-18 NOTE — Assessment & Plan Note (Signed)
Deteriorated and not at goal Hyperlipidemia:Low fat diet discussed and encouraged.   Lipid Panel  Lab Results  Component Value Date   CHOL 223 (H) 04/04/2016   HDL 38 (L) 04/04/2016   LDLCALC 125 (H) 04/04/2016   TRIG 300 (H) 04/04/2016   CHOLHDL 5.9 (H) 04/04/2016     Updated lab needed at/ before next visit.

## 2016-04-18 NOTE — Assessment & Plan Note (Signed)
Deteriorated Joanna Brown is reminded of the importance of commitment to daily physical activity for 30 minutes or more, as able and the need to limit carbohydrate intake to 30 to 60 grams per meal to help with blood sugar control.   The need to take medication as prescribed, test blood sugar as directed, and to call between visits if there is a concern that blood sugar is uncontrolled is also discussed.   Joanna Brown is reminded of the importance of daily foot exam, annual eye examination, and good blood sugar, blood pressure and cholesterol control. Increase levemir dose, test regular, parameters reviewed and keep appt with nutritionist this week rept lab in 3 months and in 6 months  Diabetic Labs Latest Ref Rng & Units 04/04/2016 12/30/2015 09/09/2015 06/08/2015 03/05/2015  HbA1c <5.7 % 8.1(H) 7.9(H) 7.4(H) 7.8(H) 7.0(H)  Microalbumin Not estab mg/dL - - - 0.4 -  Micro/Creat Ratio <30 mcg/mg creat - - - 3 -  Chol <200 mg/dL 223(H) 204(H) 186 - 138  HDL >50 mg/dL 38(L) 42(L) 36(L) - 38(L)  Calc LDL <100 mg/dL 125(H) 114 112 - 70  Triglycerides <150 mg/dL 300(H) 238(H) 190(H) - 148  Creatinine 0.60 - 0.93 mg/dL 1.02(H) 1.15(H) 1.18(H) 1.20(H) 1.00(H)   BP/Weight 04/18/2016 02/29/2016 02/17/2016 01/07/2016 10/21/2015 09/15/2015 Q000111Q  Systolic BP Q000111Q - - [id number] - A999333 0000000  Diastolic BP 82 - - 72 - 82 70  Wt. (Lbs) 185 183 184 180 179 181 180  BMI 30.79 30.45 30.62 29.95 29.79 30.12 29.95   Foot/eye exam completion dates Latest Ref Rng & Units 01/07/2016 02/25/2015  Eye Exam No Retinopathy - No Retinopathy  Foot Form Completion - Done -

## 2016-04-18 NOTE — Patient Instructions (Addendum)
Annual wellness first week in March, call if you need me sooner  MD visit 2nd week in June  Need to reduce fried and fatty foods, salt and carbs, and commit to daily exercise for improved health  Fasting lipid, cmp and eGFR and hBA1C first week in March  Increase in dose of levemir to 45 units daily, may call and speak with nurse if blood sugars are too high or low  Goal for fasting blood sugar ranges from 80 to 120 and 2 hours after any meal or at bedtime should be between 130 to 170.   Thank you  for choosing [facility]. We consider it a privelige to serve you.  Delivering excellent health care in a caring and  compassionate way is our goal.  Partnering with you,  so that together we can achieve this goal is our strategy.

## 2016-04-18 NOTE — Progress Notes (Signed)
Joanna Brown     MRN: [id number]      DOB: April 02, 1941   HPI Joanna Brown is here for follow up and re-evaluation of chronic medical conditions, medication management and review of any available recent lab and radiology data.  Preventive health is updated, specifically  Cancer screening and Immunization.   Questions or concerns regarding consultations or procedures which the PT has had in the interim are  addressed. The PT denies any adverse reactions to current medications since the last visit.  C/o uncontrolled blood sugars, and admits  To over eating carbs, however voisibly distressed at deterioration in blood sugar control, also has not been exercising as she usually was, intends to change both habits  ROS Denies recent fever or chills. Denies sinus pressure, nasal congestion, ear pain or sore throat. Denies chest congestion, productive cough or wheezing. Denies chest pains, palpitations and leg swelling Denies abdominal pain, nausea, vomiting,diarrhea or constipation.   Denies dysuria, frequency, hesitancy or incontinence. Denies joint pain, swelling and limitation in mobility. Denies headaches, seizures, numbness, or tingling. Denies depression, anxiety or insomnia. Denies skin break down or rash.   PE  BP (!) 150/82   Pulse 80   Resp 16   Ht 5\' 5"  (1.651 m)   Wt 185 lb (83.9 kg)   SpO2 98%   BMI 30.79 kg/m   Patient alert and oriented and in no cardiopulmonary distress.  HEENT: No facial asymmetry, EOMI,   oropharynx pink and moist.  Neck supple no JVD, no mass.  Chest: Clear to auscultation bilaterally.  CVS: S1, S2 systolic murmur, no S3.Regular rate.  ABD: Soft non tender.   Ext: No edema  MS: Adequate ROM spine, shoulders, hips and knees.  Skin: Intact, no ulcerations or rash noted.  Psych: Good eye contact, normal affect. Memory intact not anxious or depressed appearing.  CNS: CN 2-12 intact, power,  normal throughout.no focal deficits  noted.   Assessment & Plan  Essential hypertension Uncontrolled with elevated blood pressure, no med change, lifestyle and dietary change with weight loss at this time DASH diet and commitment to daily physical activity for a minimum of 30 minutes discussed and encouraged, as a part of hypertension management. The importance of attaining a healthy weight is also discussed.  BP/Weight 04/18/2016 02/29/2016 02/17/2016 01/07/2016 10/21/2015 09/15/2015 Q000111Q  Systolic BP Q000111Q - - [id number] - [id number] 0000000  Diastolic BP 82 - - 72 - 82 70  Wt. (Lbs) 185 183 184 180 179 181 180  BMI 30.79 30.45 30.62 29.95 29.79 30.12 29.95       Type 2 diabetes mellitus with chronic kidney disease Deteriorated Joanna Brown is reminded of the importance of commitment to daily physical activity for 30 minutes or more, as able and the need to limit carbohydrate intake to 30 to 60 grams per meal to help with blood sugar control.   The need to take medication as prescribed, test blood sugar as directed, and to call between visits if there is a concern that blood sugar is uncontrolled is also discussed.   Joanna Brown is reminded of the importance of daily foot exam, annual eye examination, and good blood sugar, blood pressure and cholesterol control. Increase levemir dose, test regular, parameters reviewed and keep appt with nutritionist this week rept lab in 3 months and in 6 months  Diabetic Labs Latest Ref Rng & Units 04/04/2016 12/30/2015 09/09/2015 06/08/2015 03/05/2015  HbA1c <5.7 % 8.1(H) 7.9(H) 7.4(H) 7.8(H) 7.0(H)  Microalbumin Not estab  mg/dL - - - 0.4 -  Micro/Creat Ratio <30 mcg/mg creat - - - 3 -  Chol <200 mg/dL 223(H) 204(H) 186 - 138  HDL >50 mg/dL 38(L) 42(L) 36(L) - 38(L)  Calc LDL <100 mg/dL 125(H) 114 112 - 70  Triglycerides <150 mg/dL 300(H) 238(H) 190(H) - 148  Creatinine 0.60 - 0.93 mg/dL 1.02(H) 1.15(H) 1.18(H) 1.20(H) 1.00(H)   BP/Weight 04/18/2016 02/29/2016 02/17/2016 01/07/2016 10/21/2015 09/15/2015  Q000111Q  Systolic BP Q000111Q - - [id number] - [id number] 0000000  Diastolic BP 82 - - 72 - 82 70  Wt. (Lbs) 185 183 184 180 179 181 180  BMI 30.79 30.45 30.62 29.95 29.79 30.12 29.95   Foot/eye exam completion dates Latest Ref Rng & Units 01/07/2016 02/25/2015  Eye Exam No Retinopathy - No Retinopathy  Foot Form Completion - Done -        Hyperlipidemia LDL goal <100 Deteriorated and not at goal Hyperlipidemia:Low fat diet discussed and encouraged.   Lipid Panel  Lab Results  Component Value Date   CHOL 223 (H) 04/04/2016   HDL 38 (L) 04/04/2016   LDLCALC 125 (H) 04/04/2016   TRIG 300 (H) 04/04/2016   CHOLHDL 5.9 (H) 04/04/2016     Updated lab needed at/ before next visit.   Obesity with serious comorbidity Deteriorated. Patient re-educated about  the importance of commitment to a  minimum of 150 minutes of exercise per week.  The importance of healthy food choices with portion control discussed. Encouraged to start a food diary, count calories and to consider  joining a support group. Sample diet sheets offered. Goals set by the patient for the next several months.   Weight /BMI 04/18/2016 02/29/2016 02/17/2016  WEIGHT 185 lb 183 lb 184 lb  HEIGHT 5\' 5"  5\' 5"  5\' 5"   BMI 30.79 kg/m2 30.45 kg/m2 30.62 kg/m2

## 2016-04-18 NOTE — Assessment & Plan Note (Signed)
Deteriorated. Patient re-educated about  the importance of commitment to a  minimum of 150 minutes of exercise per week.  The importance of healthy food choices with portion control discussed. Encouraged to start a food diary, count calories and to consider  joining a support group. Sample diet sheets offered. Goals set by the patient for the next several months.   Weight /BMI 04/18/2016 02/29/2016 02/17/2016  WEIGHT 185 lb 183 lb 184 lb  HEIGHT 5\' 5"  5\' 5"  5\' 5"   BMI 30.79 kg/m2 30.45 kg/m2 30.62 kg/m2

## 2016-04-26 DIAGNOSIS — L602 Onychogryphosis: Secondary | ICD-10-CM | POA: Diagnosis not present

## 2016-04-26 DIAGNOSIS — E1351 Other specified diabetes mellitus with diabetic peripheral angiopathy without gangrene: Secondary | ICD-10-CM | POA: Diagnosis not present

## 2016-04-28 ENCOUNTER — Ambulatory Visit: Payer: Self-pay | Admitting: Nutrition

## 2016-05-13 ENCOUNTER — Encounter (HOSPITAL_COMMUNITY): Payer: Self-pay | Admitting: Emergency Medicine

## 2016-05-13 DIAGNOSIS — Z7982 Long term (current) use of aspirin: Secondary | ICD-10-CM | POA: Diagnosis not present

## 2016-05-13 DIAGNOSIS — E1165 Type 2 diabetes mellitus with hyperglycemia: Secondary | ICD-10-CM | POA: Diagnosis not present

## 2016-05-13 DIAGNOSIS — Z794 Long term (current) use of insulin: Secondary | ICD-10-CM | POA: Insufficient documentation

## 2016-05-13 DIAGNOSIS — N189 Chronic kidney disease, unspecified: Secondary | ICD-10-CM | POA: Diagnosis not present

## 2016-05-13 DIAGNOSIS — E1122 Type 2 diabetes mellitus with diabetic chronic kidney disease: Secondary | ICD-10-CM | POA: Insufficient documentation

## 2016-05-13 DIAGNOSIS — I129 Hypertensive chronic kidney disease with stage 1 through stage 4 chronic kidney disease, or unspecified chronic kidney disease: Secondary | ICD-10-CM | POA: Insufficient documentation

## 2016-05-13 LAB — BASIC METABOLIC PANEL
Anion gap: 8 (ref 5–15)
BUN: 20 mg/dL (ref 6–20)
CO2: 29 mmol/L (ref 22–32)
Calcium: 9.4 mg/dL (ref 8.9–10.3)
Chloride: 95 mmol/L — ABNORMAL LOW (ref 101–111)
Creatinine, Ser: 0.91 mg/dL (ref 0.44–1.00)
GFR calc Af Amer: >60 mL/min (ref >60)
GFR calc non Af Amer: >60 mL/min (ref >60)
Glucose, Bld: 306 mg/dL — ABNORMAL HIGH (ref 65–99)
Potassium: 4.1 mmol/L (ref 3.5–5.1)
Sodium: 132 mmol/L — ABNORMAL LOW (ref 135–145)

## 2016-05-13 LAB — CBC
HCT: 35.6 % — ABNORMAL LOW (ref 36.0–46.0)
Hemoglobin: 12.2 g/dL (ref 12.0–15.0)
MCH: 28.4 pg (ref 26.0–34.0)
MCHC: 34.3 g/dL (ref 30.0–36.0)
MCV: 83 fL (ref 78.0–100.0)
Platelets: 206 10*3/uL (ref 150–400)
RBC: 4.29 MIL/uL (ref 3.87–5.11)
RDW: 13.3 % (ref 11.5–15.5)
WBC: 7.3 10*3/uL (ref 4.0–10.5)

## 2016-05-13 LAB — CBG MONITORING, ED: Glucose-Capillary: 332 mg/dL — ABNORMAL HIGH (ref 65–99)

## 2016-05-13 NOTE — ED Triage Notes (Addendum)
Patient here from home with complaints of hyperglycemia today. Reports that her meter was reading 437. States that has had dizziness and lightheadedness. Took 45 units at home.

## 2016-05-14 ENCOUNTER — Emergency Department (HOSPITAL_COMMUNITY)
Admission: EM | Admit: 2016-05-14 | Discharge: 2016-05-14 | Disposition: A | Discharge status: Home / Self Care | Payer: Medicare Other | Attending: Emergency Medicine | Admitting: Emergency Medicine

## 2016-05-14 DIAGNOSIS — R739 Hyperglycemia, unspecified: Secondary | ICD-10-CM

## 2016-05-14 DIAGNOSIS — E1165 Type 2 diabetes mellitus with hyperglycemia: Secondary | ICD-10-CM | POA: Diagnosis not present

## 2016-05-14 LAB — URINALYSIS, ROUTINE W REFLEX MICROSCOPIC
Bacteria, UA: NONE SEEN
Bilirubin Urine: NEGATIVE
Glucose, UA: NEGATIVE mg/dL
Ketones, ur: NEGATIVE mg/dL
Nitrite: NEGATIVE
Protein, ur: NEGATIVE mg/dL
Specific Gravity, Urine: 1.006 (ref 1.005–1.030)
pH: 5 (ref 5.0–8.0)

## 2016-05-14 LAB — CBG MONITORING, ED
Glucose-Capillary: 238 mg/dL — ABNORMAL HIGH (ref 65–99)
Glucose-Capillary: 216 mg/dL — ABNORMAL HIGH (ref 65–99)

## 2016-05-14 MED ORDER — SODIUM CHLORIDE 0.9 % IV BOLUS (SEPSIS): 1000.0000 mL | Freq: Once | INTRAVENOUS | Status: DC

## 2016-05-14 NOTE — ED Provider Notes (Signed)
[facility] DEPT Provider Note   CSN: [id number] Arrival date & time: 05/13/16  2234   By signing my name below, I, Joanna Brown, attest that this documentation has been prepared under the direction and in the presence of Everlene Balls, MD. Electronically signed, Joanna Brown, ED Scribe. 05/14/16. 3:18 AM.   History   Chief Complaint Chief Complaint  Patient presents with  . Hyperglycemia  . Dizziness   The history is provided by the patient and medical records. No language interpreter was used.    HPI Comments: Joanna Brown is a 75 y.o. female with Hx of DM who presents to the Emergency Department complaining of gradually improving hyperglycemia today. Pt states her meter read 437 at home. Down to 238 on evaluation. Reports associated shoulder pain, decreased balance  dizziness and lightheadedness. States she only ate one small piece of pie. She states her blood sugar fluctuates between 70 and 200. Further notes she drinks plenty of fluids. States that her Dr. wants her to discontinue course of glipizide. Denies fever, vomit, diarrhea, urinary, appetite change and cough.   Past Medical History:  Diagnosis Date  . Blood transfusion    1980  . Diabetes mellitus   . Dysrhythmia   . Female bladder prolapse   . GERD (gastroesophageal reflux disease)   . Heart disease   . Hyperlipidemia   . Hypertension    echo and stress 4/10 reports on chart, EKG ` LOV 9/12 on chart  . Mild aortic stenosis     Patient Active Problem List   Diagnosis Date Noted  . Obesity with serious comorbidity 04/18/2016  . Aortic stenosis 08/23/2013  . Carotid bruit 06/11/2012  . IRRITABLE BOWEL SYNDROME 08/24/2009  . BILIARY DYSKINESIA 05/31/2009  . GERD 11/18/2008  . Type 2 diabetes mellitus with chronic kidney disease ([place]) 08/06/2008  . Hyperlipidemia LDL goal <100 08/06/2008  . Overweight (BMI 25.0-29.9) 08/06/2008  . Essential hypertension 08/06/2008    Past Surgical History:    Procedure Laterality Date  . ABDOMINAL HYSTERECTOMY    . ANTERIOR AND POSTERIOR REPAIR  04/26/2011   Procedure: ANTERIOR (CYSTOCELE) AND POSTERIOR REPAIR (RECTOCELE);  Surgeon: Reece Packer, MD;  Location: [facility] ORS;  Service: Urology;  Laterality: N/A;  . CATARACT EXTRACTION Bilateral    with IOL  . CHOLECYSTECTOMY  2009  . COLONOSCOPY N/A 12/02/2013   Procedure: COLONOSCOPY;  Surgeon: Danie Binder, MD;  Location: [facility] SUITE;  Service: Endoscopy;  Laterality: N/A;  9:30 AM - rescheduled to 8:30 - Doris to notify pt  . COLONOSCOPY W/ POLYPECTOMY    . LEFT HEART CATH  09/10/2008   normal coronary arteries, normal LV systolic function, EF 123456 (Dr. [name])  . LYMPH NODE DISSECTION Right 1997   under arm  . NM MYOCAR PERF WALL MOTION  2010   dipyridamole - mild-mod in intenstiy perfusion defect in mid anterior, mid anteroseptal wall, EF 70%  . OVARY SURGERY     bilateral tumors removed  . THYROIDECTOMY    . THYROIDECTOMY  02/2008  . TRANSTHORACIC ECHOCARDIOGRAM  08/2011   EF=>55%, mild conc LVH; trace MR; mild TR; mild-mod AV calcification with mild valvular AV stenosis  . VAGINAL PROLAPSE REPAIR  04/26/2011   Procedure: VAGINAL VAULT SUSPENSION;  Surgeon: Reece Packer, MD;  Location: [facility] ORS;  Service: Urology;  Laterality: N/A;  with Graft  10x6    OB History    No data available       Home Medications  Prior to Admission medications   Medication Sig Start Date End Date Taking? Authorizing Provider  amLODipine (NORVASC) 10 MG tablet Take 1 tablet (10 mg total) by mouth every morning. 04/18/16   Fayrene Helper, MD  aspirin 81 MG tablet Take 81 mg by mouth every morning.     Historical Provider, MD  B-D ULTRAFINE III SHORT PEN 31G X 8 MM MISC USE AS DIRECTED WITH INSULIN PENS 09/30/15   Fayrene Helper, MD  benazepril (LOTENSIN) 40 MG tablet Take 1 tablet (40 mg total) by mouth daily. 04/18/16   Fayrene Helper, MD  Calcium Carbonate-Vit D-Min (CALCIUM  1200) 1200-1000 MG-UNIT CHEW Chew 1 each by mouth daily.    Historical Provider, MD  ferrous sulfate 325 (65 FE) MG tablet Take 325 mg by mouth daily with breakfast.    Historical Provider, MD  glipiZIDE (GLUCOTROL XL) 5 MG 24 hr tablet Take 1 tablet (5 mg total) by mouth daily with breakfast. 04/18/16   Fayrene Helper, MD  insulin detemir (LEVEMIR) 100 unit/ml SOLN Inject 0.45 mLs (45 Units total) into the skin at bedtime. 04/18/16   Fayrene Helper, MD  metFORMIN (GLUCOPHAGE) 1000 MG tablet Take 1 tablet (1,000 mg total) by mouth daily with breakfast. 04/18/16   Fayrene Helper, MD  Multiple Vitamins-Minerals (CENTRUM) tablet Take 1 tablet by mouth every morning.     Historical Provider, MD  Omega-3 Fatty Acids (FISH OIL) 1200 MG CAPS Take 1 capsule by mouth every morning.     Historical Provider, MD  pravastatin (PRAVACHOL) 80 MG tablet Take 1 tablet (80 mg total) by mouth daily. 04/18/16   Fayrene Helper, MD  Travoprost, BAK Free, (TRAVATAN) 0.004 % SOLN ophthalmic solution Place 1 drop into both eyes at bedtime.     Historical Provider, MD  triamterene-hydrochlorothiazide (MAXZIDE) 75-50 MG tablet Take 1 tablet by mouth daily. 04/18/16   Fayrene Helper, MD    Family History Family History  Problem Relation Age of Onset  . Stomach cancer Mother 55  . Heart disease Mother 53    heart disease  . Heart disease Father 73    MI  . Liver cancer Sister   . Hypertension Sister   . Hypertension Sister   . Bone cancer Sister   . Stroke Maternal Grandfather   . Heart attack Paternal Grandfather   . Hypertension Sister   . Hypertension Child     Social History Social History  Substance Use Topics  . Smoking status: Never Smoker  . Smokeless tobacco: Never Used  . Alcohol use No     Comment: socially- none x 30 years     Allergies   Spironolactone   Review of Systems Review of Systems  All other systems reviewed and are negative.  A complete 10 system review of  systems was obtained and all systems are negative except as noted in the HPI and PMH.    Physical Exam Updated Vital Signs BP 145/92 (BP Location: Left Arm)   Pulse 99   Temp 97.9 F (36.6 C) (Oral)   Resp 18   SpO2 100%   Physical Exam  Constitutional: She is oriented to person, place, and time. She appears well-developed and well-nourished. No distress.  HENT:  Head: Normocephalic and atraumatic.  Nose: Nose normal.  Mouth/Throat: Oropharynx is clear and moist. No oropharyngeal exudate.  Eyes: Conjunctivae and EOM are normal. Pupils are equal, round, and reactive to light. No scleral icterus.  Neck: Normal range  of motion. Neck supple. No JVD present. No tracheal deviation present. No thyromegaly present.  Cardiovascular: Normal rate, regular rhythm and normal heart sounds.  Exam reveals no gallop and no friction rub.   No murmur heard. Pulmonary/Chest: Effort normal and breath sounds normal. No respiratory distress. She has no wheezes. She exhibits no tenderness.  Abdominal: Soft. Bowel sounds are normal. She exhibits no distension and no mass. There is no tenderness. There is no rebound and no guarding.  Musculoskeletal: Normal range of motion. She exhibits no edema or tenderness.  Lymphadenopathy:    She has no cervical adenopathy.  Neurological: She is alert and oriented to person, place, and time. No cranial nerve deficit. She exhibits normal muscle tone.  Skin: Skin is warm and dry. No rash noted. No erythema. No pallor.  Nursing note and vitals reviewed.    ED Treatments / Results  DIAGNOSTIC STUDIES: Oxygen Saturation is 100% on RA, normal by my interpretation.    COORDINATION OF CARE: 3:18 AM Discussed treatment plan with pt at bedside and pt agreed to plan.  Labs (all labs ordered are listed, but only abnormal results are displayed) Labs Reviewed  BASIC METABOLIC PANEL - Abnormal; Notable for the following:       Result Value   Sodium 132 (*)    Chloride 95 (*)     Glucose, Bld 306 (*)    All other components within normal limits  CBC - Abnormal; Notable for the following:    HCT 35.6 (*)    All other components within normal limits  CBG MONITORING, ED - Abnormal; Notable for the following:    Glucose-Capillary 332 (*)    All other components within normal limits  CBG MONITORING, ED - Abnormal; Notable for the following:    Glucose-Capillary 238 (*)    All other components within normal limits  URINALYSIS, ROUTINE W REFLEX MICROSCOPIC    EKG  EKG Interpretation None       Radiology No results found.  Procedures Procedures (including critical care time)  Medications Ordered in ED Medications - No data to display   Initial Impression / Assessment and Plan / ED Course  I have reviewed the triage vital signs and the nursing notes.  Pertinent labs & imaging results that were available during my care of the patient were reviewed by me and considered in my medical decision making (see chart for details).  Will order fluids and urinalysis.  Clinical Course     Patient presents to the ED for hyperglycemia after eating cake.  She took her normal medications and upon my evaluation, her BS is now 40.  There is no gap on the original lab work.  She was given a large cup of water in the ED for rehydration and advised to fu with PCP within 3 days for any medication adjustments.  She appears well and in NAD.  Vs remain within her normal limits and she I ssafe for DC.  Final Clinical Impressions(s) / ED Diagnoses   Final diagnoses:  Hyperglycemia    New Prescriptions New Prescriptions   No medications on file    I personally performed the services described in this documentation, which was scribed in my presence. The recorded information has been reviewed and is accurate.      Everlene Balls, MD 05/14/16 [phone]

## 2016-05-14 NOTE — ED Triage Notes (Signed)
Dr Claudine Mouton in to evaluate pt, request to allow pt to drink water since blood sugar is decreasing, U/A sent to lab, pt feeling better. Continue to monitor.

## 2016-06-07 DIAGNOSIS — H401131 Primary open-angle glaucoma, bilateral, mild stage: Secondary | ICD-10-CM | POA: Diagnosis not present

## 2016-07-05 DIAGNOSIS — Z794 Long term (current) use of insulin: Secondary | ICD-10-CM | POA: Diagnosis not present

## 2016-07-05 DIAGNOSIS — E785 Hyperlipidemia, unspecified: Secondary | ICD-10-CM | POA: Diagnosis not present

## 2016-07-05 DIAGNOSIS — E1122 Type 2 diabetes mellitus with diabetic chronic kidney disease: Secondary | ICD-10-CM | POA: Diagnosis not present

## 2016-07-05 LAB — LIPID PANEL
Cholesterol: 182 mg/dL (ref <200)
HDL: 39 mg/dL — ABNORMAL LOW (ref >50)
LDL Cholesterol: 100 mg/dL — ABNORMAL HIGH (ref <100)
Total CHOL/HDL Ratio: 4.7 Ratio (ref <5.0)
Triglycerides: 214 mg/dL — ABNORMAL HIGH (ref <150)
VLDL: 43 mg/dL — ABNORMAL HIGH (ref <30)

## 2016-07-05 LAB — COMPLETE METABOLIC PANEL WITH GFR
ALT: 28 U/L (ref 6–29)
AST: 26 U/L (ref 10–35)
Albumin: 4.1 g/dL (ref 3.6–5.1)
Alkaline Phosphatase: 47 U/L (ref 33–130)
BUN: 14 mg/dL (ref 7–25)
CO2: 29 mmol/L (ref 20–31)
Calcium: 9.8 mg/dL (ref 8.6–10.4)
Chloride: 97 mmol/L — ABNORMAL LOW (ref 98–110)
Creat: 1.09 mg/dL — ABNORMAL HIGH (ref 0.60–0.93)
GFR, Est African American: 58 mL/min — ABNORMAL LOW (ref >=60)
GFR, Est Non African American: 50 mL/min — ABNORMAL LOW (ref >=60)
Glucose, Bld: 171 mg/dL — ABNORMAL HIGH (ref 65–99)
Potassium: 4.4 mmol/L (ref 3.5–5.3)
Sodium: 136 mmol/L (ref 135–146)
Total Bilirubin: 0.4 mg/dL (ref 0.2–1.2)
Total Protein: 7.2 g/dL (ref 6.1–8.1)

## 2016-07-06 LAB — HEMOGLOBIN A1C
Hgb A1c MFr Bld: 8.7 % — ABNORMAL HIGH (ref <5.7)
Mean Plasma Glucose: 203 mg/dL

## 2016-07-19 ENCOUNTER — Encounter: Payer: Self-pay | Admitting: Family Medicine

## 2016-07-19 ENCOUNTER — Ambulatory Visit: Payer: Self-pay

## 2016-07-19 ENCOUNTER — Ambulatory Visit (INDEPENDENT_AMBULATORY_CARE_PROVIDER_SITE_OTHER): Payer: Medicare Other | Admitting: Family Medicine

## 2016-07-19 ENCOUNTER — Ambulatory Visit (INDEPENDENT_AMBULATORY_CARE_PROVIDER_SITE_OTHER): Payer: Medicare Other

## 2016-07-19 VITALS — BP 146/82 | HR 76 | Resp 18 | Ht 65.0 in | Wt 185.0 lb

## 2016-07-19 VITALS — BP 138/82 | HR 76 | Resp 15 | Ht 65.0 in | Wt 185.0 lb

## 2016-07-19 DIAGNOSIS — I10 Essential (primary) hypertension: Secondary | ICD-10-CM | POA: Diagnosis not present

## 2016-07-19 DIAGNOSIS — Z780 Asymptomatic menopausal state: Secondary | ICD-10-CM

## 2016-07-19 DIAGNOSIS — E1122 Type 2 diabetes mellitus with diabetic chronic kidney disease: Secondary | ICD-10-CM

## 2016-07-19 DIAGNOSIS — N181 Chronic kidney disease, stage 1: Secondary | ICD-10-CM

## 2016-07-19 DIAGNOSIS — Z0000 Encounter for general adult medical examination without abnormal findings: Secondary | ICD-10-CM

## 2016-07-19 DIAGNOSIS — Z1231 Encounter for screening mammogram for malignant neoplasm of breast: Secondary | ICD-10-CM | POA: Diagnosis not present

## 2016-07-19 DIAGNOSIS — Z794 Long term (current) use of insulin: Secondary | ICD-10-CM | POA: Diagnosis not present

## 2016-07-19 DIAGNOSIS — E6609 Other obesity due to excess calories: Secondary | ICD-10-CM

## 2016-07-19 DIAGNOSIS — Z6830 Body mass index (BMI) 30.0-30.9, adult: Secondary | ICD-10-CM

## 2016-07-19 DIAGNOSIS — Z1239 Encounter for other screening for malignant neoplasm of breast: Secondary | ICD-10-CM

## 2016-07-19 NOTE — Patient Instructions (Addendum)
Joanna Brown , Thank you for taking time to come for your Medicare Wellness Visit. I appreciate your ongoing commitment to your health goals. Please review the following plan we discussed and let me know if I can assist you in the future.   Your bone density and mammogram orders have been entered and sent to the [facility].  Please call to schedule those.  [phone] ext: [phone]    The referral for assistance with blood sugar testing supplies has been entered and you will be contacted  Your next visit with Dr. Moshe Cipro is today  Your next wellness visit will be in one year  This is a list of the screening recommended for you and due dates:  Health Maintenance  Topic Date Due  . Flu Shot  01/26/2017*  . Hemoglobin A1C  01/02/2017  . Complete foot exam   01/06/2017  . Eye exam for diabetics  02/23/2017  . Mammogram  08/13/2017  . Tetanus Vaccine  10/04/2020  . Colon Cancer Screening  12/04/2023  . DEXA scan (bone density measurement)  Completed  . Pneumonia vaccines  Completed  *Topic was postponed. The date shown is not the original due date.     Preventive Care 41 Years and Older, Female Preventive care refers to lifestyle choices and visits with your health care provider that can promote health and wellness. What does preventive care include?  A yearly physical exam. This is also called an annual well check.  Dental exams once or twice a year.  Routine eye exams. Ask your health care provider how often you should have your eyes checked.  Personal lifestyle choices, including:  Daily care of your teeth and gums.  Regular physical activity.  Eating a healthy diet.  Avoiding tobacco and drug use.  Limiting alcohol use.  Practicing safe sex.  Taking low-dose aspirin every day.  Taking vitamin and mineral supplements as recommended by your health care provider. What happens during an annual well check? The services and screenings done by your health care provider  during your annual well check will depend on your age, overall health, lifestyle risk factors, and family history of disease. Counseling  Your health care provider may ask you questions about your:  Alcohol use.  Tobacco use.  Drug use.  Emotional well-being.  Home and relationship well-being.  Sexual activity.  Eating habits.  History of falls.  Memory and ability to understand (cognition).  Work and work Statistician.  Reproductive health. Screening  You may have the following tests or measurements:  Height, weight, and BMI.  Blood pressure.  Lipid and cholesterol levels. These may be checked every 5 years, or more frequently if you are over 21 years old.  Skin check.  Lung cancer screening. You may have this screening every year starting at age 83 if you have a 30-pack-year history of smoking and currently smoke or have quit within the past 15 years.  Fecal occult blood test (FOBT) of the stool. You may have this test every year starting at age 46.  Flexible sigmoidoscopy or colonoscopy. You may have a sigmoidoscopy every 5 years or a colonoscopy every 10 years starting at age 97.  Hepatitis C blood test.  Hepatitis B blood test.  Sexually transmitted disease (STD) testing.  Diabetes screening. This is done by checking your blood sugar (glucose) after you have not eaten for a while (fasting). You may have this done every 1-3 years.  Bone density scan. This is done to screen for osteoporosis.  You may have this done starting at age 5.  Mammogram. This may be done every 1-2 years. Talk to your health care provider about how often you should have regular mammograms. Talk with your health care provider about your test results, treatment options, and if necessary, the need for more tests. Vaccines  Your health care provider may recommend certain vaccines, such as:  Influenza vaccine. This is recommended every year.  Tetanus, diphtheria, and acellular pertussis  (Tdap, Td) vaccine. You may need a Td booster every 10 years.  Varicella vaccine. You may need this if you have not been vaccinated.  Zoster vaccine. You may need this after age 37.  Measles, mumps, and rubella (MMR) vaccine. You may need at least one dose of MMR if you were born in 1957 or later. You may also need a second dose.  Pneumococcal 13-valent conjugate (PCV13) vaccine. One dose is recommended after age 32.  Pneumococcal polysaccharide (PPSV23) vaccine. One dose is recommended after age 32.  Meningococcal vaccine. You may need this if you have certain conditions.  Hepatitis A vaccine. You may need this if you have certain conditions or if you travel or work in places where you may be exposed to hepatitis A.  Hepatitis B vaccine. You may need this if you have certain conditions or if you travel or work in places where you may be exposed to hepatitis B.  Haemophilus influenzae type b (Hib) vaccine. You may need this if you have certain conditions. Talk to your health care provider about which screenings and vaccines you need and how often you need them. This information is not intended to replace advice given to you by your health care provider. Make sure you discuss any questions you have with your health care provider. Document Released: 05/29/2015 Document Revised: 01/20/2016 Document Reviewed: 03/03/2015 Elsevier Interactive Patient Education  2017 [employer].

## 2016-07-19 NOTE — Progress Notes (Signed)
Subjective:   Joanna Brown is a 76 y.o. female who presents for Medicare Annual (Subsequent) preventive examination.   Cardiac Risk Factors include: diabetes mellitus;hypertension;advanced age (>12men, >16 women);dyslipidemia     Objective:     Vitals: BP (!) 146/82   Pulse 76   Resp 18   Ht 5\' 5"  (1.651 m)   Wt 185 lb (83.9 kg)   SpO2 98%   BMI 30.79 kg/m   Body mass index is 30.79 kg/m.   Tobacco History  Smoking Status  . Never Smoker  Smokeless Tobacco  . Never Used       Past Medical History:  Diagnosis Date  . Blood transfusion    1980  . Diabetes mellitus   . Dysrhythmia   . Female bladder prolapse   . GERD (gastroesophageal reflux disease)   . Heart disease   . Hyperlipidemia   . Hypertension    echo and stress 4/10 reports on chart, EKG ` LOV 9/12 on chart  . Mild aortic stenosis    Past Surgical History:  Procedure Laterality Date  . ABDOMINAL HYSTERECTOMY    . ANTERIOR AND POSTERIOR REPAIR  04/26/2011   Procedure: ANTERIOR (CYSTOCELE) AND POSTERIOR REPAIR (RECTOCELE);  Surgeon: Reece Packer, MD;  Location: [facility];  Service: Urology;  Laterality: N/A;  . CATARACT EXTRACTION Bilateral    with IOL  . CHOLECYSTECTOMY  2009  . COLONOSCOPY N/A 12/02/2013   Procedure: COLONOSCOPY;  Surgeon: Danie Binder, MD;  Location: [facility];  Service: Endoscopy;  Laterality: N/A;  9:30 AM - rescheduled to 8:30 - Doris to notify pt  . COLONOSCOPY W/ POLYPECTOMY    . LEFT HEART CATH  09/10/2008   normal coronary arteries, normal LV systolic function, EF 123456 (Dr. [name])  . LYMPH NODE DISSECTION Right 1997   under arm  . NM MYOCAR PERF WALL MOTION  2010   dipyridamole - mild-mod in intenstiy perfusion defect in mid anterior, mid anteroseptal wall, EF 70%  . OVARY SURGERY     bilateral tumors removed  . THYROIDECTOMY    . THYROIDECTOMY  02/2008  . TRANSTHORACIC ECHOCARDIOGRAM  08/2011   EF=>55%, mild conc LVH; trace MR; mild TR; mild-mod AV  calcification with mild valvular AV stenosis  . VAGINAL PROLAPSE REPAIR  04/26/2011   Procedure: VAGINAL VAULT SUSPENSION;  Surgeon: Reece Packer, MD;  Location: [facility];  Service: Urology;  Laterality: N/A;  with Graft  10x6   Family History  Problem Relation Age of Onset  . Stomach cancer Mother 28  . Heart disease Mother 36    heart disease  . Heart disease Father 96    MI  . Liver cancer Sister   . Hypertension Sister   . Hypertension Sister   . Bone cancer Sister   . Stroke Maternal Grandfather   . Heart attack Paternal Grandfather   . Hypertension Sister   . Hypertension Child    History  Sexual Activity  . Sexual activity: Yes    Outpatient Encounter Prescriptions as of 07/19/2016  Medication Sig  . amLODipine (NORVASC) 10 MG tablet Take 1 tablet (10 mg total) by mouth every morning.  Marland Kitchen aspirin 81 MG tablet Take 81 mg by mouth every morning.   . B-D ULTRAFINE III SHORT PEN 31G X 8 MM MISC USE AS DIRECTED WITH INSULIN PENS  . benazepril (LOTENSIN) 40 MG tablet Take 1 tablet (40 mg total) by mouth daily.  . Calcium Carbonate-Vit D-Min (CALCIUM  1200) 1200-1000 MG-UNIT CHEW Chew 1 each by mouth daily.  . ferrous sulfate 325 (65 FE) MG tablet Take 325 mg by mouth daily with breakfast.  . glipiZIDE (GLUCOTROL XL) 5 MG 24 hr tablet Take 1 tablet (5 mg total) by mouth daily with breakfast.  . insulin detemir (LEVEMIR) 100 unit/ml SOLN Inject 0.45 mLs (45 Units total) into the skin at bedtime.  . metFORMIN (GLUCOPHAGE) 1000 MG tablet Take 1 tablet (1,000 mg total) by mouth daily with breakfast.  . Multiple Vitamins-Minerals (CENTRUM) tablet Take 1 tablet by mouth every morning.   . Nutritional Supplements (NUTRITIONAL SUPPLEMENT PO) Take by mouth. Neurx-TF (formulation for peripheral nerve health)  . Omega-3 Fatty Acids (FISH OIL) 1200 MG CAPS Take 1 capsule by mouth every morning.   . pravastatin (PRAVACHOL) 80 MG tablet Take 1 tablet (80 mg total) by mouth daily.  .  Travoprost, BAK Free, (TRAVATAN) 0.004 % SOLN ophthalmic solution Place 1 drop into both eyes at bedtime.   . triamterene-hydrochlorothiazide (MAXZIDE) 75-50 MG tablet Take 1 tablet by mouth daily.   No facility-administered encounter medications on file as of 07/19/2016.     Activities of Daily Living In your present state of health, do you have any difficulty performing the following activities: 07/19/2016 01/07/2016  Hearing? N N  Vision? N N  Difficulty concentrating or making decisions? N N  Walking or climbing stairs? N N  Dressing or bathing? N N  Doing errands, shopping? N N  Preparing Food and eating ? N -  Using the Toilet? N -  In the past six months, have you accidently leaked urine? N -  Do you have problems with loss of bowel control? N -  Managing your Medications? N -  Managing your Finances? N -  Housekeeping or managing your Housekeeping? N -  Some recent data might be hidden    Patient Care Team: Fayrene Helper, MD as PCP - General [name], MD as Attending Physician (Cardiology) Rutherford Guys, MD as Attending Physician (Ophthalmology) Arlana Lindau, RD as Dietitian (Nutrition)    Assessment:   Exercise Activities and Dietary recommendations Current Exercise Habits: Home exercise routine, Type of exercise: walking;stretching, Time (Minutes): 45, Frequency (Times/Week): 7, Weekly Exercise (Minutes/Week): 315, Intensity: Moderate  Goals    . HEMOGLOBIN A1C < 7.0          A1C 8.7 07/05/2016   Will continue see nutritionist  Will work with pcp to manage medications       Fall Risk Fall Risk  07/19/2016 02/29/2016 02/17/2016 10/21/2015 07/30/2015  Falls in the past year? No No No No No   Depression Screen PHQ 2/9 Scores 07/19/2016 02/29/2016 02/17/2016 10/21/2015  PHQ - 2 Score 0 0 0 0  PHQ- 9 Score - - - -     Cognitive Function: Normal by direct observation MMSE - Mini Mental State Exam 07/23/2014  Orientation to time 5  Orientation to Place 5    Registration 3  Attention/ Calculation 4  Recall 1  Language- name 2 objects 2  Language- repeat 1  Language- follow 3 step command 3  Language- read & follow direction 1  Write a sentence 1  Copy design 1  Total score 27     Immunization History  Administered Date(s) Administered  . Pneumococcal Conjugate-13 12/11/2013  . Pneumococcal Polysaccharide-23 01/13/2010  . Tdap 10/05/2010   Screening Tests Health Maintenance  Topic Date Due  . INFLUENZA VACCINE  01/26/2017 (Originally 12/15/2015)  .  HEMOGLOBIN A1C  01/02/2017  . FOOT EXAM  01/06/2017  . OPHTHALMOLOGY EXAM  02/23/2017  . MAMMOGRAM  08/13/2017  . TETANUS/TDAP  10/04/2020  . COLONOSCOPY  12/04/2023  . DEXA SCAN  Completed  . PNA vac Low Risk Adult  Completed      Plan:  I have personally reviewed and addressed the Medicare Annual Wellness questionnaire and have noted the following in the patient's chart:  A. Medical and social history B. Use of alcohol, tobacco or illicit drugs  C. Current medications and supplements D. Functional ability and status E.  Nutritional status F.  Physical activity G. Advance directives H. List of other physicians I.  Hospitalizations, surgeries, and ER visits in previous 12 months J.  [place] to include cognitive, depression, and falls L. Referrals and appointments - Mammogram and Dexa ordered and orders faxed to [facility]patient aware to call and schedule. Referral entered for community resource referral for assistance with getting diabetic testing supplies.   In addition, I have reviewed and discussed with patient certain preventive protocols, quality metrics, and best practice recommendations. A written personalized care plan for preventive services as well as general preventive health recommendations were provided to patient.  Signed,   [place]

## 2016-07-19 NOTE — Patient Instructions (Addendum)
F/u with rectal  August 25 or after, call if you need me sooner  Increase levemir to 50 units daily and PLEASE KEEP appt with Dr Dorris Fetch, his office will call you  It is important that you exercise regularly at least 30 minutes 5 times a week. If you develop chest pain, have severe difficulty breathing, or feel very tired, stop exercising immediately and seek medical attention  Goal for fasting blood sugar ranges from 80 to 120 and 2 hours after any meal or at bedtime should be between 130 to 170.   Thank you  for choosing [facility]. We consider it a privelige to serve you.  Delivering excellent health care in a caring and  compassionate way is our goal.  Partnering with you,  so that together we can achieve this goal is our strategy.

## 2016-07-19 NOTE — Assessment & Plan Note (Signed)
Increase levimir to 50 units refer to d endo Deteriorated Joanna Brown is reminded of the importance of commitment to daily physical activity for 30 minutes or more, as able and the need to limit carbohydrate intake to 30 to 60 grams per meal to help with blood sugar control.   The need to take medication as prescribed, test blood sugar as directed, and to call between visits if there is a concern that blood sugar is uncontrolled is also discussed.   Joanna Brown is reminded of the importance of daily foot exam, annual eye examination, and good blood sugar, blood pressure and cholesterol control.  Diabetic Labs Latest Ref Rng & Units 07/05/2016 05/13/2016 04/04/2016 12/30/2015 09/09/2015  HbA1c <5.7 % 8.7(H) - 8.1(H) 7.9(H) 7.4(H)  Microalbumin Not estab mg/dL - - - - -  Micro/Creat Ratio <30 mcg/mg creat - - - - -  Chol <200 mg/dL 182 - 223(H) 204(H) 186  HDL >50 mg/dL 39(L) - 38(L) 42(L) 36(L)  Calc LDL <100 mg/dL 100(H) - 125(H) 114 112  Triglycerides <150 mg/dL 214(H) - 300(H) 238(H) 190(H)  Creatinine 0.60 - 0.93 mg/dL 1.09(H) 0.91 1.02(H) 1.15(H) 1.18(H)   BP/Weight 07/19/2016 07/19/2016 05/14/2016 04/18/2016 02/29/2016 02/17/2016 [id number]  Systolic BP 0000000 [id number] - - [id number]  Diastolic BP 82 82 79 82 - - 72  Wt. (Lbs) 185 185 - 185 183 184 180  BMI 30.79 30.79 - 30.79 30.45 30.62 29.95   Foot/eye exam completion dates Latest Ref Rng & Units 02/24/2016 01/07/2016  Eye Exam No Retinopathy No Retinopathy -  Foot Form Completion - - Done

## 2016-07-20 DIAGNOSIS — D2371 Other benign neoplasm of skin of right lower limb, including hip: Secondary | ICD-10-CM | POA: Diagnosis not present

## 2016-07-20 DIAGNOSIS — L602 Onychogryphosis: Secondary | ICD-10-CM | POA: Diagnosis not present

## 2016-07-20 DIAGNOSIS — E1351 Other specified diabetes mellitus with diabetic peripheral angiopathy without gangrene: Secondary | ICD-10-CM | POA: Diagnosis not present

## 2016-07-21 NOTE — Progress Notes (Signed)
SOMMER SPICKARD     MRN: [id number]      DOB: [birth year]   HPI Ms. Delillo is here for follow up and re-evaluation of chronic medical conditions, medication management and review of any available recent lab and radiology data.  Specifically being seen by me because of deterioration in her blood  Sugar control. States she "gets frustrated' at times. Has been diabetic for over 30 years, and control is worsened . I explained the value of management by endo and she is agreeing to go, states blood sugar varies a lot "depending on what she eats"  ROS Denies recent fever or chills. Denies sinus pressure, nasal congestion, ear pain or sore throat. Denies chest congestion, productive cough or wheezing. Denies chest pains, palpitations and leg swelling Denies abdominal pain, nausea, vomiting,diarrhea or constipation.   Denies dysuria, frequency, hesitancy or incontinence.    PE  BP 138/82   Pulse 76   Resp 15   Ht 5\' 5"  (1.651 m)   Wt 185 lb (83.9 kg)   SpO2 98%   BMI 30.79 kg/m   Patient alert and oriented and in no cardiopulmonary distress.  HEENT: No facial asymmetry, EOMI,   oropharynx pink and moist.  Neck supple no JVD, no mass.  Chest: Clear to auscultation bilaterally.  CVS: S1, S2 no murmurs, no S3.Regular rate.  ABD: Soft non tender.   Ext: No edema  MS: Adequate ROM spine, shoulders, hips and knees.  Skin: Intact, no ulcerations or rash noted.  Psych: Good eye contact, normal affect. Memory intact not anxious or depressed appearing.  CNS: CN 2-12 intact, power,  normal throughout.no focal deficits noted.   Assessment & Plan  Type 2 diabetes mellitus with chronic kidney disease Increase levimir to 50 units refer to d endo Deteriorated Ms. Crymes is reminded of the importance of commitment to daily physical activity for 30 minutes or more, as able and the need to limit carbohydrate intake to 30 to 60 grams per meal to help with blood sugar control.   The  need to take medication as prescribed, test blood sugar as directed, and to call between visits if there is a concern that blood sugar is uncontrolled is also discussed.   Ms. Diffee is reminded of the importance of daily foot exam, annual eye examination, and good blood sugar, blood pressure and cholesterol control.  Diabetic Labs Latest Ref Rng & Units 07/05/2016 05/13/2016 04/04/2016 12/30/2015 09/09/2015  HbA1c <5.7 % 8.7(H) - 8.1(H) 7.9(H) 7.4(H)  Microalbumin Not estab mg/dL - - - - -  Micro/Creat Ratio <30 mcg/mg creat - - - - -  Chol <200 mg/dL 182 - 223(H) 204(H) 186  HDL >50 mg/dL 39(L) - 38(L) 42(L) 36(L)  Calc LDL <100 mg/dL 100(H) - 125(H) 114 112  Triglycerides <150 mg/dL 214(H) - 300(H) 238(H) 190(H)  Creatinine 0.60 - 0.93 mg/dL 1.09(H) 0.91 1.02(H) 1.15(H) 1.18(H)   BP/Weight 07/19/2016 07/19/2016 05/14/2016 04/18/2016 02/29/2016 02/17/2016 1/75/1025  Systolic BP 852 778 242 353 - - 614  Diastolic BP 82 82 79 82 - - 72  Wt. (Lbs) 185 185 - 185 183 184 180  BMI 30.79 30.79 - 30.79 30.45 30.62 29.95   Foot/eye exam completion dates Latest Ref Rng & Units 02/24/2016 01/07/2016  Eye Exam No Retinopathy No Retinopathy -  Foot Form Completion - - Done        Essential hypertension Controlled, no change in medication DASH diet and commitment to daily physical activity for a minimum  of 30 minutes discussed and encouraged, as a part of hypertension management. The importance of attaining a healthy weight is also discussed.  BP/Weight 07/19/2016 07/19/2016 05/14/2016 04/18/2016 02/29/2016 02/17/2016 7/90/3833  Systolic BP 383 291 916 606 - - 004  Diastolic BP 82 82 79 82 - - 72  Wt. (Lbs) 185 185 - 185 183 184 180  BMI 30.79 30.79 - 30.79 30.45 30.62 29.95       Obesity with serious comorbidity Deteriorated. Patient re-educated about  the importance of commitment to a  minimum of 150 minutes of exercise per week.  The importance of healthy food choices with portion control  discussed. Encouraged to start a food diary, count calories and to consider  joining a support group. Sample diet sheets offered. Goals set by the patient for the next several months.   Weight /BMI 07/19/2016 07/19/2016 04/18/2016  WEIGHT 185 lb 185 lb 185 lb  HEIGHT 5\' 5"  5\' 5"  5\' 5"   BMI 30.79 kg/m2 30.79 kg/m2 30.79 kg/m2

## 2016-07-21 NOTE — Assessment & Plan Note (Signed)
Controlled, no change in medication DASH diet and commitment to daily physical activity for a minimum of 30 minutes discussed and encouraged, as a part of hypertension management. The importance of attaining a healthy weight is also discussed.  BP/Weight 07/19/2016 07/19/2016 05/14/2016 04/18/2016 02/29/2016 02/17/2016 09/30/6158  Systolic BP 737 106 269 485 - - 462  Diastolic BP 82 82 79 82 - - 72  Wt. (Lbs) 185 185 - 185 183 184 180  BMI 30.79 30.79 - 30.79 30.45 30.62 29.95

## 2016-07-21 NOTE — Assessment & Plan Note (Signed)
Deteriorated. Patient re-educated about  the importance of commitment to a  minimum of 150 minutes of exercise per week.  The importance of healthy food choices with portion control discussed. Encouraged to start a food diary, count calories and to consider  joining a support group. Sample diet sheets offered. Goals set by the patient for the next several months.   Weight /BMI 07/19/2016 07/19/2016 04/18/2016  WEIGHT 185 lb 185 lb 185 lb  HEIGHT 5\' 5"  5\' 5"  5\' 5"   BMI 30.79 kg/m2 30.79 kg/m2 30.79 kg/m2

## 2016-08-01 ENCOUNTER — Telehealth: Payer: Self-pay | Admitting: Family Medicine

## 2016-08-01 ENCOUNTER — Other Ambulatory Visit: Payer: Self-pay

## 2016-08-01 MED ORDER — INSULIN DETEMIR 100 UNIT/ML FLEXPEN: 45.0000 [IU] | Freq: Every day | SUBCUTANEOUS | 1 refills | Status: DC

## 2016-08-01 NOTE — Telephone Encounter (Signed)
Med sent.

## 2016-08-01 NOTE — Telephone Encounter (Signed)
Joanna Brown is asking for a Rx for her Diabetic Medication be sent to [facility] # [id number] for 90 day supply of Levamir please advise?

## 2016-08-09 ENCOUNTER — Other Ambulatory Visit: Payer: Self-pay

## 2016-08-09 MED ORDER — INSULIN DETEMIR 100 UNIT/ML FLEXPEN: 45.0000 [IU] | Freq: Every day | SUBCUTANEOUS | 1 refills | Status: DC

## 2016-08-23 ENCOUNTER — Ambulatory Visit (INDEPENDENT_AMBULATORY_CARE_PROVIDER_SITE_OTHER): Payer: Medicare Other | Admitting: "Endocrinology

## 2016-08-23 ENCOUNTER — Encounter: Payer: Self-pay | Admitting: "Endocrinology

## 2016-08-23 ENCOUNTER — Telehealth (HOSPITAL_COMMUNITY): Payer: Self-pay | Admitting: Internal Medicine

## 2016-08-23 VITALS — BP 134/73 | HR 77 | Ht 65.0 in | Wt 181.0 lb

## 2016-08-23 DIAGNOSIS — Z794 Long term (current) use of insulin: Secondary | ICD-10-CM | POA: Diagnosis not present

## 2016-08-23 DIAGNOSIS — I10 Essential (primary) hypertension: Secondary | ICD-10-CM | POA: Diagnosis not present

## 2016-08-23 DIAGNOSIS — N181 Chronic kidney disease, stage 1: Secondary | ICD-10-CM

## 2016-08-23 DIAGNOSIS — E1122 Type 2 diabetes mellitus with diabetic chronic kidney disease: Secondary | ICD-10-CM

## 2016-08-23 DIAGNOSIS — Z6830 Body mass index (BMI) 30.0-30.9, adult: Secondary | ICD-10-CM

## 2016-08-23 DIAGNOSIS — E6609 Other obesity due to excess calories: Secondary | ICD-10-CM | POA: Diagnosis not present

## 2016-08-23 DIAGNOSIS — E782 Mixed hyperlipidemia: Secondary | ICD-10-CM

## 2016-08-23 MED ORDER — METFORMIN HCL 500 MG PO TABS: 500.0000 mg | ORAL_TABLET | Freq: Every day | ORAL | 2 refills | Status: DC

## 2016-08-23 NOTE — Progress Notes (Signed)
Subjective:    Patient ID: Joanna Brown, female    DOB: [birth year]. Patient is being seen in consultation for management of diabetes requested by  Tula Nakayama, MD  Past Medical History:  Diagnosis Date  . Blood transfusion    1980  . Diabetes mellitus   . Dysrhythmia   . Female bladder prolapse   . GERD (gastroesophageal reflux disease)   . Heart disease   . Hyperlipidemia   . Hypertension    echo and stress 4/10 reports on chart, EKG ` LOV 9/12 on chart  . Mild aortic stenosis    Past Surgical History:  Procedure Laterality Date  . ABDOMINAL HYSTERECTOMY    . ANTERIOR AND POSTERIOR REPAIR  04/26/2011   Procedure: ANTERIOR (CYSTOCELE) AND POSTERIOR REPAIR (RECTOCELE);  Surgeon: Reece Packer, MD;  Location: [facility];  Service: Urology;  Laterality: N/A;  . CATARACT EXTRACTION Bilateral    with IOL  . CHOLECYSTECTOMY  2009  . COLONOSCOPY N/A 12/02/2013   Procedure: COLONOSCOPY;  Surgeon: Danie Binder, MD;  Location: [facility];  Service: Endoscopy;  Laterality: N/A;  9:30 AM - rescheduled to 8:30 - Doris to notify pt  . COLONOSCOPY W/ POLYPECTOMY    . LEFT HEART CATH  09/10/2008   normal coronary arteries, normal LV systolic function, EF 14% (Dr. [name])  . LYMPH NODE DISSECTION Right 1997   under arm  . NM MYOCAR PERF WALL MOTION  2010   dipyridamole - mild-mod in intenstiy perfusion defect in mid anterior, mid anteroseptal wall, EF 70%  . OVARY SURGERY     bilateral tumors removed  . THYROIDECTOMY    . THYROIDECTOMY  02/2008  . TRANSTHORACIC ECHOCARDIOGRAM  08/2011   EF=>55%, mild conc LVH; trace MR; mild TR; mild-mod AV calcification with mild valvular AV stenosis  . VAGINAL PROLAPSE REPAIR  04/26/2011   Procedure: VAGINAL VAULT SUSPENSION;  Surgeon: Reece Packer, MD;  Location: [facility];  Service: Urology;  Laterality: N/A;  with Graft  10x6   Social History   Social History  . Marital status: Married    Spouse name: [name]  . Number of  children: 1  . Years of education: Trade   Occupational History  . Retired    Social History Main Topics  . Smoking status: Never Smoker  . Smokeless tobacco: Never Used  . Alcohol use No     Comment: socially- none x 30 years  . Drug use: No  . Sexual activity: Yes   Other Topics Concern  . None   Social History Narrative   Patient lives at home with spouse.   Caffeine Use: 1 cup of coffee daily   Outpatient Encounter Prescriptions as of 08/23/2016  Medication Sig  . Insulin Detemir (LEVEMIR FLEXTOUCH Simpson) Inject 35 Units into the skin at bedtime.  Marland Kitchen amLODipine (NORVASC) 10 MG tablet Take 1 tablet (10 mg total) by mouth every morning.  Marland Kitchen aspirin 81 MG tablet Take 81 mg by mouth every morning.   . B-D ULTRAFINE III SHORT PEN 31G X 8 MM MISC USE AS DIRECTED WITH INSULIN PENS  . benazepril (LOTENSIN) 40 MG tablet Take 1 tablet (40 mg total) by mouth daily.  . Calcium Carbonate-Vit D-Min (CALCIUM 1200) 1200-1000 MG-UNIT CHEW Chew 1 each by mouth daily.  . ferrous sulfate 325 (65 FE) MG tablet Take 325 mg by mouth daily with breakfast.  . metFORMIN (GLUCOPHAGE) 500 MG tablet Take 1 tablet (500 mg total) by  mouth daily with breakfast.  . Multiple Vitamins-Minerals (CENTRUM) tablet Take 1 tablet by mouth every morning.   . Nutritional Supplements (NUTRITIONAL SUPPLEMENT PO) Take by mouth. Neurx-TF (formulation for peripheral nerve health)  . Omega-3 Fatty Acids (FISH OIL) 1200 MG CAPS Take 1 capsule by mouth every morning.   . pravastatin (PRAVACHOL) 80 MG tablet Take 1 tablet (80 mg total) by mouth daily.  . Travoprost, BAK Free, (TRAVATAN) 0.004 % SOLN ophthalmic solution Place 1 drop into both eyes at bedtime.   . triamterene-hydrochlorothiazide (MAXZIDE) 75-50 MG tablet Take 1 tablet by mouth daily.  . [DISCONTINUED] glipiZIDE (GLUCOTROL XL) 5 MG 24 hr tablet Take 1 tablet (5 mg total) by mouth daily with breakfast.  . [DISCONTINUED] insulin detemir (LEVEMIR) 100 unit/ml SOLN Inject  0.45 mLs (45 Units total) into the skin at bedtime.  . [DISCONTINUED] metFORMIN (GLUCOPHAGE) 1000 MG tablet Take 1 tablet (1,000 mg total) by mouth daily with breakfast.   No facility-administered encounter medications on file as of 08/23/2016.    ALLERGIES: Allergies  Allergen Reactions  . Spironolactone     Stomach problems, vision changes    VACCINATION STATUS: Immunization History  Administered Date(s) Administered  . Pneumococcal Conjugate-13 12/11/2013  . Pneumococcal Polysaccharide-23 01/13/2010  . Tdap 10/05/2010    Diabetes  She presents for her initial diabetic visit. She has type 2 diabetes mellitus. Onset time: She was diagnosed at approximate age of 68 years. Her disease course has been worsening. There are no hypoglycemic associated symptoms. Pertinent negatives for hypoglycemia include no confusion, headaches, pallor or seizures. Associated symptoms include blurred vision, fatigue, polydipsia and polyuria. Pertinent negatives for diabetes include no chest pain and no polyphagia. There are no hypoglycemic complications. Symptoms are worsening. Diabetic complications include nephropathy and retinopathy. Risk factors for coronary artery disease include dyslipidemia, diabetes mellitus, obesity and sedentary lifestyle. Her weight is increasing steadily. She is following a generally unhealthy diet. When asked about meal planning, she reported none. She has had a previous visit with a dietitian. She rarely participates in exercise. (She did not bring any meter nor logs to review today. She admits he does not monitor blood glucose regularly.) Eye exam is current.  Hyperlipidemia  This is a chronic problem. The current episode started more than 1 year ago. Exacerbating diseases include diabetes and obesity. Pertinent negatives include no chest pain, myalgias or shortness of breath. Current antihyperlipidemic treatment includes statins. Risk factors for coronary artery disease include  dyslipidemia, diabetes mellitus, hypertension, obesity and a sedentary lifestyle.  Hypertension  This is a chronic problem. The current episode started more than 1 year ago. Associated symptoms include blurred vision. Pertinent negatives include no chest pain, headaches, palpitations or shortness of breath. Risk factors for coronary artery disease include dyslipidemia, diabetes mellitus, obesity and sedentary lifestyle. Hypertensive end-organ damage includes retinopathy.       Review of Systems  Constitutional: Positive for fatigue. Negative for chills, fever and unexpected weight change.  HENT: Negative for trouble swallowing and voice change.   Eyes: Positive for blurred vision. Negative for visual disturbance.  Respiratory: Negative for cough, shortness of breath and wheezing.   Cardiovascular: Negative for chest pain, palpitations and leg swelling.  Gastrointestinal: Negative for diarrhea, nausea and vomiting.  Endocrine: Positive for polydipsia and polyuria. Negative for cold intolerance, heat intolerance and polyphagia.  Musculoskeletal: Negative for arthralgias and myalgias.  Skin: Negative for color change, pallor, rash and wound.  Neurological: Negative for seizures and headaches.  Psychiatric/Behavioral: Negative for confusion  and suicidal ideas.    Objective:    BP 134/73   Pulse 77   Ht 5\' 5"  (1.651 m)   Wt 181 lb (82.1 kg)   BMI 30.12 kg/m   Wt Readings from Last 3 Encounters:  08/23/16 181 lb (82.1 kg)  07/19/16 185 lb (83.9 kg)  07/19/16 185 lb (83.9 kg)    Physical Exam  Constitutional: She is oriented to person, place, and time. She appears well-developed.  HENT:  Head: Normocephalic and atraumatic.  Eyes: EOM are normal.  Neck: Normal range of motion. Neck supple. No tracheal deviation present. No thyromegaly present.  Cardiovascular: Normal rate and regular rhythm.   Pulmonary/Chest: Effort normal and breath sounds normal.  Abdominal: Soft. Bowel sounds  are normal. There is no tenderness. There is no guarding.  Musculoskeletal: Normal range of motion. She exhibits no edema.  Neurological: She is alert and oriented to person, place, and time. She has normal reflexes. No cranial nerve deficit. Coordination normal.  Skin: Skin is warm and dry. No rash noted. No erythema. No pallor.  Psychiatric: She has a normal mood and affect. Judgment normal.     CMP ( most recent) CMP     Component Value Date/Time   NA 136 07/05/2016 0715   K 4.4 07/05/2016 0715   CL 97 (L) 07/05/2016 0715   CO2 29 07/05/2016 0715   GLUCOSE 171 (H) 07/05/2016 0715   BUN 14 07/05/2016 0715   CREATININE 1.09 (H) 07/05/2016 0715   CALCIUM 9.8 07/05/2016 0715   PROT 7.2 07/05/2016 0715   ALBUMIN 4.1 07/05/2016 0715   AST 26 07/05/2016 0715   ALT 28 07/05/2016 0715   ALKPHOS 47 07/05/2016 0715   BILITOT 0.4 07/05/2016 0715   GFRNONAA 50 (L) 07/05/2016 0715   GFRAA 58 (L) 07/05/2016 0715     Diabetic Labs (most recent): Lab Results  Component Value Date   HGBA1C 8.7 (H) 07/05/2016   HGBA1C 8.1 (H) 04/04/2016   HGBA1C 7.9 (H) 12/30/2015     Lipid Panel ( most recent) Lipid Panel     Component Value Date/Time   CHOL 182 07/05/2016 0715   TRIG 214 (H) 07/05/2016 0715   HDL 39 (L) 07/05/2016 0715   CHOLHDL 4.7 07/05/2016 0715   VLDL 43 (H) 07/05/2016 0715   LDLCALC 100 (H) 07/05/2016 0715       Assessment & Plan:   1. Type 2 diabetes mellitus with stage 1 chronic kidney disease, with long-term current use of insulin ([place])   - Patient has currently uncontrolled symptomatic type 2 DM since  76 years of age,  with most recent A1c of 8.7 %. Recent labs reviewed, showing stage.   Her diabetes is complicated by CKD obesity/sedentary life and patient remains at a high risk for more acute and chronic complications of diabetes which include CAD, CVA, CKD, retinopathy, and neuropathy. These are all discussed in detail with the patient.  - I have counseled  the patient on diet management and weight loss, by adopting a carbohydrate restricted/protein rich diet.  - Suggestion is made for patient to avoid simple carbohydrates   from their diet including Cakes , Desserts, Ice Cream,  Soda (  diet and regular) , Sweet Tea , Candies,  Chips, Cookies, Artificial Sweeteners,   and "Sugar-free" Products . This will help patient to have stable blood glucose profile and potentially avoid unintended weight gain.  - I encouraged the patient to switch to  unprocessed or minimally processed complex  starch and increased protein intake (animal or plant source), fruits, and vegetables.  - Patient is advised to stick to a routine mealtimes to eat 3 meals  a day and avoid unnecessary snacks ( to snack only to correct hypoglycemia).  - The patient will be scheduled with Jearld Fenton, RDN, CDE for individualized DM education.  - I have approached patient with the following individualized plan to manage diabetes and patient agrees:   - I  will proceed to initiate  strict monitoring of glucose  AC and HS. - I will adjust her basal insulin Levemir to 35 units daily at bedtime.  -Patient is encouraged to call clinic for blood glucose levels less than 70 or above 300 mg /dl. - I will continue metformin 500 mg by mouth twice a day, therapeutically suitable for patient.. - I will discontinue glipizide, risk outweighs benefit for this patient. -Patient is not a candidate for  SGLT2 inhibitors due to CKD.  - Patient will be considered for incretin therapy as appropriate next visit. - Patient specific target  A1c;  LDL, HDL, Triglycerides, and  Waist Circumference were discussed in detail.  2) BP/HTN: Controlled. Continue current medications.  3) Lipids/HPL:   Uncontrolled, LDL at 100.   Patient is advised tocontinue statins. 4)  Weight/Diet: CDE Consult has been initiated , exercise, and detailed carbohydrates information provided.  5) Chronic Care/Health  Maintenance:  -Patient is on ACEI/ARB and Statin medications and encouraged to continue to follow up with Ophthalmology, Podiatrist at least yearly or according to recommendations, and advised to  stay away from smoking. I have recommended yearly flu vaccine and pneumonia vaccination at least every 5 years; moderate intensity exercise for up to 150 minutes weekly; and  sleep for at least 7 hours a day.  - 60 minutes of time was spent on the care of this patient , 50% of which was applied for counseling on diabetes complications and their preventions.  - Patient to bring meter and  blood glucose logs during her next visit.   - I advised patient to maintain close follow up with Tula Nakayama, MD for primary care needs.  Follow up plan: - Return in about 10 days (around 09/02/2016) for follow up with meter and logs- no labs.  Glade Lloyd, MD Phone: [phone]  Fax: [phone]   08/23/2016, 3:35 PM

## 2016-08-23 NOTE — Patient Instructions (Signed)

## 2016-08-25 NOTE — Telephone Encounter (Signed)
08/23/2016 08:22 AM Phone (Outgoing) Joanna Brown, Joanna Brown (Self) [phone] (Brown)   Left Message - Called pt and lmsg for her to CB.Marland KitchenRG    By Verdene Rio

## 2016-09-05 ENCOUNTER — Ambulatory Visit (INDEPENDENT_AMBULATORY_CARE_PROVIDER_SITE_OTHER): Payer: Medicare Other | Admitting: "Endocrinology

## 2016-09-05 ENCOUNTER — Encounter: Payer: Self-pay | Admitting: "Endocrinology

## 2016-09-05 VITALS — BP 143/77 | HR 81 | Ht 65.0 in | Wt 181.0 lb

## 2016-09-05 DIAGNOSIS — I10 Essential (primary) hypertension: Secondary | ICD-10-CM | POA: Diagnosis not present

## 2016-09-05 DIAGNOSIS — Z6830 Body mass index (BMI) 30.0-30.9, adult: Secondary | ICD-10-CM | POA: Diagnosis not present

## 2016-09-05 DIAGNOSIS — E6609 Other obesity due to excess calories: Secondary | ICD-10-CM

## 2016-09-05 DIAGNOSIS — Z794 Long term (current) use of insulin: Secondary | ICD-10-CM | POA: Diagnosis not present

## 2016-09-05 DIAGNOSIS — N181 Chronic kidney disease, stage 1: Secondary | ICD-10-CM | POA: Diagnosis not present

## 2016-09-05 DIAGNOSIS — E1122 Type 2 diabetes mellitus with diabetic chronic kidney disease: Secondary | ICD-10-CM

## 2016-09-05 DIAGNOSIS — E782 Mixed hyperlipidemia: Secondary | ICD-10-CM

## 2016-09-05 MED ORDER — ONETOUCH VERIO W/DEVICE KIT: 1.0000 | PACK | 0 refills | Status: DC | PRN

## 2016-09-05 MED ORDER — GLUCOSE BLOOD VI STRP: ORAL_STRIP | 3 refills | Status: DC

## 2016-09-05 NOTE — Progress Notes (Signed)
Subjective:    Patient ID: Joanna Brown, female    DOB: [birth year]. Patient is being seen in consultation for management of diabetes requested by  Tula Nakayama, MD  Past Medical History:  Diagnosis Date  . Blood transfusion    1980  . Diabetes mellitus   . Dysrhythmia   . Female bladder prolapse   . GERD (gastroesophageal reflux disease)   . Heart disease   . Hyperlipidemia   . Hypertension    echo and stress 4/10 reports on chart, EKG ` LOV 9/12 on chart  . Mild aortic stenosis    Past Surgical History:  Procedure Laterality Date  . ABDOMINAL HYSTERECTOMY    . ANTERIOR AND POSTERIOR REPAIR  04/26/2011   Procedure: ANTERIOR (CYSTOCELE) AND POSTERIOR REPAIR (RECTOCELE);  Surgeon: Reece Packer, MD;  Location: [facility];  Service: Urology;  Laterality: N/A;  . CATARACT EXTRACTION Bilateral    with IOL  . CHOLECYSTECTOMY  2009  . COLONOSCOPY N/A 12/02/2013   Procedure: COLONOSCOPY;  Surgeon: Danie Binder, MD;  [facility];  Service: Endoscopy;  Laterality: N/A;  9:30 AM - rescheduled to 8:30 - Doris to notify pt  . COLONOSCOPY W/ POLYPECTOMY    . LEFT HEART CATH  09/10/2008   normal coronary arteries, normal LV systolic function, EF 08% (Dr. [name])  . LYMPH NODE DISSECTION Right 1997   under arm  . NM MYOCAR PERF WALL MOTION  2010   dipyridamole - mild-mod in intenstiy perfusion defect in mid anterior, mid anteroseptal wall, EF 70%  . OVARY SURGERY     bilateral tumors removed  . THYROIDECTOMY    . THYROIDECTOMY  02/2008  . TRANSTHORACIC ECHOCARDIOGRAM  08/2011   EF=>55%, mild conc LVH; trace MR; mild TR; mild-mod AV calcification with mild valvular AV stenosis  . VAGINAL PROLAPSE REPAIR  04/26/2011   Procedure: VAGINAL VAULT SUSPENSION;  Surgeon: Reece Packer, MD;  Location: [facility];  Service: Urology;  Laterality: N/A;  with Graft  10x6   Social History   Social History  . Marital status: Married    Spouse name: Joanna Brown  . Number of  children: 1  . Years of education: Trade   Occupational History  . Retired    Social History Main Topics  . Smoking status: Never Smoker  . Smokeless tobacco: Never Used  . Alcohol use No     Comment: socially- none x 30 years  . Drug use: No  . Sexual activity: Yes   Other Topics Concern  . None   Social History Narrative   Patient lives at home with spouse.   Caffeine Use: 1 cup of coffee daily   Outpatient Encounter Prescriptions as of 09/05/2016  Medication Sig  . amLODipine (NORVASC) 10 MG tablet Take 1 tablet (10 mg total) by mouth every morning.  Marland Kitchen aspirin 81 MG tablet Take 81 mg by mouth every morning.   . B-D ULTRAFINE III SHORT PEN 31G X 8 MM MISC USE AS DIRECTED WITH INSULIN PENS  . benazepril (LOTENSIN) 40 MG tablet Take 1 tablet (40 mg total) by mouth daily.  . Blood Glucose Monitoring Suppl (ONETOUCH VERIO) w/Device KIT 1 each by Does not apply route as needed.  . Calcium Carbonate-Vit D-Min (CALCIUM 1200) 1200-1000 MG-UNIT CHEW Chew 1 each by mouth daily.  . ferrous sulfate 325 (65 FE) MG tablet Take 325 mg by mouth daily with breakfast.  . glucose blood (ONE TOUCH ULTRA TEST) test strip  Use as instructed  . Insulin Detemir (LEVEMIR FLEXTOUCH [place]) Inject 40 Units into the skin at bedtime.  . metFORMIN (GLUCOPHAGE) 500 MG tablet Take 1 tablet (500 mg total) by mouth daily with breakfast.  . Multiple Vitamins-Minerals (CENTRUM) tablet Take 1 tablet by mouth every morning.   . Nutritional Supplements (NUTRITIONAL SUPPLEMENT PO) Take by mouth. Neurx-TF (formulation for peripheral nerve health)  . Omega-3 Fatty Acids (FISH OIL) 1200 MG CAPS Take 1 capsule by mouth every morning.   . pravastatin (PRAVACHOL) 80 MG tablet Take 1 tablet (80 mg total) by mouth daily.  . Travoprost, BAK Free, (TRAVATAN) 0.004 % SOLN ophthalmic solution Place 1 drop into both eyes at bedtime.   . triamterene-hydrochlorothiazide (MAXZIDE) 75-50 MG tablet Take 1 tablet by mouth daily.  .  [DISCONTINUED] Blood Glucose Monitoring Suppl (ONETOUCH VERIO) w/Device KIT 1 each by Does not apply route as needed.  . [DISCONTINUED] glucose blood (ONE TOUCH ULTRA TEST) test strip Use as instructed   No facility-administered encounter medications on file as of 09/05/2016.    ALLERGIES: Allergies  Allergen Reactions  . Spironolactone     Stomach problems, vision changes    VACCINATION STATUS: Immunization History  Administered Date(s) Administered  . Pneumococcal Conjugate-13 12/11/2013  . Pneumococcal Polysaccharide-23 01/13/2010  . Tdap 10/05/2010    Diabetes  She presents for her follow-up diabetic visit. She has type 2 diabetes mellitus. Onset time: She was diagnosed at approximate age of 76 years. Her disease course has been improving. There are no hypoglycemic associated symptoms. Pertinent negatives for hypoglycemia include no confusion, headaches, pallor or seizures. Associated symptoms include blurred vision and fatigue. Pertinent negatives for diabetes include no chest pain, no polydipsia, no polyphagia and no polyuria. There are no hypoglycemic complications. Symptoms are improving. Diabetic complications include nephropathy and retinopathy. Risk factors for coronary artery disease include dyslipidemia, diabetes mellitus, obesity and sedentary lifestyle. Her weight is increasing steadily. She is following a generally unhealthy diet. When asked about meal planning, she reported none. She has had a previous visit with a dietitian. She rarely participates in exercise. Her breakfast blood glucose range is generally 140-180 mg/dl. Her lunch blood glucose range is generally 140-180 mg/dl. Her dinner blood glucose range is generally 140-180 mg/dl. Her overall blood glucose range is 140-180 mg/dl. Eye exam is current.  Hyperlipidemia  This is a chronic problem. The current episode started more than 1 year ago. Exacerbating diseases include diabetes and obesity. Pertinent negatives include  no chest pain, myalgias or shortness of breath. Current antihyperlipidemic treatment includes statins. Risk factors for coronary artery disease include dyslipidemia, diabetes mellitus, hypertension, obesity and a sedentary lifestyle.  Hypertension  This is a chronic problem. The current episode started more than 1 year ago. Associated symptoms include blurred vision. Pertinent negatives include no chest pain, headaches, palpitations or shortness of breath. Risk factors for coronary artery disease include dyslipidemia, diabetes mellitus, obesity and sedentary lifestyle. Hypertensive end-organ damage includes retinopathy.       Review of Systems  Constitutional: Positive for fatigue. Negative for chills, fever and unexpected weight change.  HENT: Negative for trouble swallowing and voice change.   Eyes: Positive for blurred vision. Negative for visual disturbance.  Respiratory: Negative for cough, shortness of breath and wheezing.   Cardiovascular: Negative for chest pain, palpitations and leg swelling.  Gastrointestinal: Negative for diarrhea, nausea and vomiting.  Endocrine: Negative for cold intolerance, heat intolerance, polydipsia, polyphagia and polyuria.  Musculoskeletal: Negative for arthralgias and myalgias.  Skin: Negative  for color change, pallor, rash and wound.  Neurological: Negative for seizures and headaches.  Psychiatric/Behavioral: Negative for confusion and suicidal ideas.    Objective:    BP (!) 143/77   Pulse 81   Ht '5\' 5"'  (1.651 m)   Wt 181 lb (82.1 kg)   BMI 30.12 kg/m   Wt Readings from Last 3 Encounters:  09/05/16 181 lb (82.1 kg)  08/23/16 181 lb (82.1 kg)  07/19/16 185 lb (83.9 kg)    Physical Exam  Constitutional: She is oriented to person, place, and time. She appears well-developed.  HENT:  Head: Normocephalic and atraumatic.  Eyes: EOM are normal.  Neck: Normal range of motion. Neck supple. No tracheal deviation present. No thyromegaly present.   Cardiovascular: Normal rate and regular rhythm.   Pulmonary/Chest: Effort normal and breath sounds normal.  Abdominal: Soft. Bowel sounds are normal. There is no tenderness. There is no guarding.  Musculoskeletal: Normal range of motion. She exhibits no edema.  Neurological: She is alert and oriented to person, place, and time. She has normal reflexes. No cranial nerve deficit. Coordination normal.  Skin: Skin is warm and dry. No rash noted. No erythema. No pallor.  Psychiatric: She has a normal mood and affect. Judgment normal.     CMP ( most recent) CMP     Component Value Date/Time   NA 136 07/05/2016 0715   K 4.4 07/05/2016 0715   CL 97 (L) 07/05/2016 0715   CO2 29 07/05/2016 0715   GLUCOSE 171 (H) 07/05/2016 0715   BUN 14 07/05/2016 0715   CREATININE 1.09 (H) 07/05/2016 0715   CALCIUM 9.8 07/05/2016 0715   PROT 7.2 07/05/2016 0715   ALBUMIN 4.1 07/05/2016 0715   AST 26 07/05/2016 0715   ALT 28 07/05/2016 0715   ALKPHOS 47 07/05/2016 0715   BILITOT 0.4 07/05/2016 0715   GFRNONAA 50 (L) 07/05/2016 0715   GFRAA 58 (L) 07/05/2016 0715     Diabetic Labs (most recent): Lab Results  Component Value Date   HGBA1C 8.7 (H) 07/05/2016   HGBA1C 8.1 (H) 04/04/2016   HGBA1C 7.9 (H) 12/30/2015     Lipid Panel ( most recent) Lipid Panel     Component Value Date/Time   CHOL 182 07/05/2016 0715   TRIG 214 (H) 07/05/2016 0715   HDL 39 (L) 07/05/2016 0715   CHOLHDL 4.7 07/05/2016 0715   VLDL 43 (H) 07/05/2016 0715   LDLCALC 100 (H) 07/05/2016 0715       Assessment & Plan:   1. Type 2 diabetes mellitus with stage 1 chronic kidney disease, with long-term current use of insulin ([place])   - Patient has currently uncontrolled symptomatic type 2 DM since  76 years of age,  with most recent A1c of 8.7 %. Recent labs reviewed, showing stage.   Her diabetes is complicated by CKD obesity/sedentary life and patient remains at a high risk for more acute and chronic complications of  diabetes which include CAD, CVA, CKD, retinopathy, and neuropathy. These are all discussed in detail with the patient.  - I have counseled the patient on diet management and weight loss, by adopting a carbohydrate restricted/protein rich diet.  - Suggestion is made for patient to avoid simple carbohydrates   from their diet including Cakes , Desserts, Ice Cream,  Soda (  diet and regular) , Sweet Tea , Candies,  Chips, Cookies, Artificial Sweeteners,   and "Sugar-free" Products . This will help patient to have stable blood glucose profile and  potentially avoid unintended weight gain.  - I encouraged the patient to switch to  unprocessed or minimally processed complex starch and increased protein intake (animal or plant source), fruits, and vegetables.  - Patient is advised to stick to a routine mealtimes to eat 3 meals  a day and avoid unnecessary snacks ( to snack only to correct hypoglycemia).  - The patient will be scheduled with Jearld Fenton, RDN, CDE for individualized DM education.  - I have approached patient with the following individualized plan to manage diabetes and patient agrees:   - Based on her glycemic profile, she would not require prandial insulin at this time. - I will increase her basal insulin Levemir to 40 units daily at bedtime, she will continue to monitor blood glucose twice a day-before breakfast and at bedtime.  -Patient is encouraged to call clinic for blood glucose levels less than 70 or above 300 mg /dl. - I will continue metformin 500 mg by mouth twice a day, therapeutically suitable for patient.. - I will discontinue glipizide, risk outweighs benefit for this patient. -Patient is not a candidate for  SGLT2 inhibitors due to CKD.  - Patient will be considered for incretin therapy as appropriate next visit. - Patient specific target  A1c;  LDL, HDL, Triglycerides, and  Waist Circumference were discussed in detail.  2) BP/HTN: Controlled. Continue current  medications.  3) Lipids/HPL:   Uncontrolled, LDL at 100.   Patient is advised tocontinue statins. 4)  Weight/Diet: CDE Consult has been initiated , exercise, and detailed carbohydrates information provided.  5) Chronic Care/Health Maintenance:  -Patient is on ACEI/ARB and Statin medications and encouraged to continue to follow up with Ophthalmology, Podiatrist at least yearly or according to recommendations, and advised to  stay away from smoking. I have recommended yearly flu vaccine and pneumonia vaccination at least every 5 years; moderate intensity exercise for up to 150 minutes weekly; and  sleep for at least 7 hours a day.  - 30 minutes of time was spent on the care of this patient , 50% of which was applied for counseling on diabetes complications and their preventions.  - Patient to bring meter and  blood glucose logs during her next visit.   - I advised patient to maintain close follow up with Tula Nakayama, MD for primary care needs.  Follow up plan: - Return in about 6 weeks (around 10/17/2016) for follow up with pre-visit labs, meter, and logs.  Glade Lloyd, MD Phone: [phone]  Fax: [phone]   09/05/2016, 2:56 PM

## 2016-09-05 NOTE — Patient Instructions (Signed)

## 2016-09-12 ENCOUNTER — Ambulatory Visit (INDEPENDENT_AMBULATORY_CARE_PROVIDER_SITE_OTHER): Payer: Medicare Other | Admitting: Internal Medicine

## 2016-09-12 ENCOUNTER — Encounter: Payer: Self-pay | Admitting: Internal Medicine

## 2016-09-12 VITALS — BP 134/80 | HR 70 | Ht 65.0 in | Wt 181.4 lb

## 2016-09-12 DIAGNOSIS — I10 Essential (primary) hypertension: Secondary | ICD-10-CM | POA: Diagnosis not present

## 2016-09-12 DIAGNOSIS — E1122 Type 2 diabetes mellitus with diabetic chronic kidney disease: Secondary | ICD-10-CM

## 2016-09-12 DIAGNOSIS — I350 Nonrheumatic aortic (valve) stenosis: Secondary | ICD-10-CM

## 2016-09-12 DIAGNOSIS — E785 Hyperlipidemia, unspecified: Secondary | ICD-10-CM

## 2016-09-12 DIAGNOSIS — N181 Chronic kidney disease, stage 1: Secondary | ICD-10-CM | POA: Diagnosis not present

## 2016-09-12 DIAGNOSIS — Z794 Long term (current) use of insulin: Secondary | ICD-10-CM

## 2016-09-12 NOTE — Progress Notes (Signed)
OFFICE NOTE  Chief Complaint:  No complaints  Primary Care Physician: Tula Nakayama, MD  HPI:  Joanna Brown is a 76 year old female who has a history of mild aortic stenosis with a valve area of approximately 1.7 cm, also a history of dyslipidemia and hypertension, both of which have been well controlled. We are seeing her back for an annual visit today. She reports actually feeling very well, started walking every day, has made major changes to her diet as reflected by a marked decrease in triglycerides. Unfortunately recently she had a mild elevation in liver enzymes slightly above normal on lovastatin. Typically we could tolerate liver enzymes up to 3 times normal on statin medications, however, she was taken off the lovastatin. Either way it is questionable whether she really needs the additional statin at this time and this certainly argues that she should have further workup as to why she had elevated liver enzymes, whether this is due to a new non-alcoholic steatohepatitis or perhaps concomitant medications causing her elevated liver enzymes.  In fact, I reviewed her CT scan today he years ago and she does have steatohepatitis.  Therefore she is not a good candidate for a statin medication. Her blood pressure seems to be well-controlled on her current regimen.  I saw Joanna Brown back in the office today. She is without any new complaints. She occasionally gets some heartburn and is not currently taking medication for that. She denies any chest pain or worsening shortness of breath.  Joanna Brown returns today for follow-up. Again she is without complaints. We discussed her aortic stenosis however she seemed to have little recollection that she has this disorder. I again went over aortic stenosis and the fact that she has mild to moderate narrowing of the aortic valve. This time we are monitoring it clinically. Her last echo was in April of last year. She is asymptomatic. Her blood  pressure is well controlled. She had recent laboratory work which shows an LDL cholesterol of 70 in October which is good control. Her hemoglobin A1c is 7 which is down from 8.3 prior to that. I've encouraged her to continue with this trend is good cholesterol and blood sugar control her helpful in slowing the process of aortic stenosis.  09/12/2016  Joanna Brown was seen today in follow-up. Overall she seems to be doing well. She has no complaints such as shortness of breath or chest pain. EKG is stable showing normal sinus rhythm at 70 with LAFB. Blood pressure is at goal today. She reports her 11 A1c is in the low 7 range. Cholesterol is also been fairly well controlled. She does have a history of moderate aortic stenosis and is due for repeat echo.  PMHx:  Past Medical History:  Diagnosis Date  . Blood transfusion    1980  . Diabetes mellitus   . Dysrhythmia   . Female bladder prolapse   . GERD (gastroesophageal reflux disease)   . Heart disease   . Hyperlipidemia   . Hypertension    echo and stress 4/10 reports on chart, EKG ` LOV 9/12 on chart  . Mild aortic stenosis     Past Surgical History:  Procedure Laterality Date  . ABDOMINAL HYSTERECTOMY    . ANTERIOR AND POSTERIOR REPAIR  04/26/2011   Procedure: ANTERIOR (CYSTOCELE) AND POSTERIOR REPAIR (RECTOCELE);  Surgeon: Reece Packer, MD;  Location: [facility];  Service: Urology;  Laterality: N/A;  . CATARACT EXTRACTION Bilateral    with IOL  .  CHOLECYSTECTOMY  2009  . COLONOSCOPY N/A 12/02/2013   Procedure: COLONOSCOPY;  Surgeon: Danie Binder, MD;  [facility];  Service: Endoscopy;  Laterality: N/A;  9:30 AM - rescheduled to 8:30 - Doris to notify pt  . COLONOSCOPY W/ POLYPECTOMY    . LEFT HEART CATH  09/10/2008   normal coronary arteries, normal LV systolic function, EF 02% (Dr. [name])  . LYMPH NODE DISSECTION Right 1997   under arm  . NM MYOCAR PERF WALL MOTION  2010   dipyridamole - mild-mod in intenstiy  perfusion defect in mid anterior, mid anteroseptal wall, EF 70%  . OVARY SURGERY     bilateral tumors removed  . THYROIDECTOMY    . THYROIDECTOMY  02/2008  . TRANSTHORACIC ECHOCARDIOGRAM  08/2011   EF=>55%, mild conc LVH; trace MR; mild TR; mild-mod AV calcification with mild valvular AV stenosis  . VAGINAL PROLAPSE REPAIR  04/26/2011   Procedure: VAGINAL VAULT SUSPENSION;  Surgeon: Reece Packer, MD;  Location: [facility];  Service: Urology;  Laterality: N/A;  with Graft  10x6    FAMHx:  Family History  Problem Relation Age of Onset  . Stomach cancer Mother 89  . Heart disease Mother 49    heart disease  . Heart disease Father 45    MI  . Liver cancer Sister   . Hypertension Sister   . Hypertension Sister   . Bone cancer Sister   . Stroke Maternal Grandfather   . Heart attack Paternal Grandfather   . Hypertension Sister   . Hypertension Child     SOCHx:   reports that she has never smoked. She has never used smokeless tobacco. She reports that she does not drink alcohol or use drugs.  ALLERGIES:  Allergies  Allergen Reactions  . Spironolactone     Stomach problems, vision changes     ROS: Pertinent items noted in HPI and remainder of comprehensive ROS otherwise negative.  HOME MEDS: Current Outpatient Prescriptions  Medication Sig Dispense Refill  . amLODipine (NORVASC) 10 MG tablet Take 1 tablet (10 mg total) by mouth every morning. 90 tablet 1  . aspirin 81 MG tablet Take 81 mg by mouth every morning.     . B-D ULTRAFINE III SHORT PEN 31G X 8 MM MISC USE AS DIRECTED WITH INSULIN PENS 100 each 3  . benazepril (LOTENSIN) 40 MG tablet Take 1 tablet (40 mg total) by mouth daily. 90 tablet 1  . Blood Glucose Monitoring Suppl (ONETOUCH VERIO) w/Device KIT 1 each by Does not apply route as needed. 1 kit 0  . Calcium Carbonate-Vit D-Min (CALCIUM 1200) 1200-1000 MG-UNIT CHEW Chew 1 each by mouth daily.    . ferrous sulfate 325 (65 FE) MG tablet Take 325 mg by mouth daily  with breakfast.    . glucose blood (ONE TOUCH ULTRA TEST) test strip Use as instructed 100 each 3  . Insulin Detemir (LEVEMIR FLEXTOUCH [place]) Inject 40 Units into the skin at bedtime.    . metFORMIN (GLUCOPHAGE) 500 MG tablet Take 1 tablet (500 mg total) by mouth daily with breakfast. 60 tablet 2  . Multiple Vitamins-Minerals (CENTRUM) tablet Take 1 tablet by mouth every morning.     . Nutritional Supplements (NUTRITIONAL SUPPLEMENT PO) Take by mouth. Neurx-TF (formulation for peripheral nerve health)    . Omega-3 Fatty Acids (FISH OIL) 1200 MG CAPS Take 1 capsule by mouth every morning.     . pravastatin (PRAVACHOL) 80 MG tablet Take 1 tablet (  80 mg total) by mouth daily. 90 tablet 1  . Travoprost, BAK Free, (TRAVATAN) 0.004 % SOLN ophthalmic solution Place 1 drop into both eyes at bedtime.     . triamterene-hydrochlorothiazide (MAXZIDE) 75-50 MG tablet Take 1 tablet by mouth daily. 90 tablet 1   No current facility-administered medications for this visit.     LABS/IMAGING: No results found for this or any previous visit (from the past 48 hour(s)). No results found.  VITALS: BP 134/80   Pulse 70   Ht _0  (1.651 m)   Wt 181 lb 6.4 oz (82.3 kg)   BMI 30.19 kg/m   EXAM: General appearance: alert and no distress Neck: no carotid bruit and no JVD Lungs: clear to auscultation bilaterally Heart: regular rate and rhythm, S1, S2 normal and systolic murmur: early systolic 3/6, crescendo at 2nd left intercostal space Abdomen: soft, non-tender; bowel sounds normal; no masses,  no organomegaly Extremities: extremities normal, atraumatic, no cyanosis or edema Pulses: 2+ and symmetric Skin: Skin color, texture, turgor normal. No rashes or lesions Neurologic: Grossly normal Psych: Mood, affect normal  EKG: Normal sinus rhythm at 70, left anterior fascicular block, left axis deviation  ASSESSMENT: 1. Hypertension-controlled 2. Dyslipidemia 3. Mild to moderate aortic  stenosis 4. NAFLD 5. GERD 6. DM2 on insulin  PLAN: 1.   Joanna Brown is asymptomatic at this time. She has fairly well-controlled cholesterol diabetes. On exam she has what sounds like moderate aortic stenosis. I suspect this is progressed from her prior echo had like to repeat that. To this point though she does not report any symptoms. We'll need to monitor this closely and if she remains asymptomatic and developed severe aortic stenosis I recommend treadmill exercise testing to see if we can provoke symptoms.  Follow-up annually.  [name], MD, [facility] Attending Cardiologist [place] Mariateresa Batra 09/12/2016, 4:34 PM

## 2016-09-12 NOTE — Patient Instructions (Signed)
Your physician has requested that you have an echocardiogram @ [address] - [address]. Echocardiography is a painless test that uses sound waves to create images of your heart. It provides your doctor with information about the size and shape of your heart and how well your heart's chambers and valves are working. This procedure takes approximately one hour. There are no restrictions for this procedure.  Your physician wants you to follow-up in ONE YEAR with Dr. Hilty.  You will receive a reminder letter in the mail two months in advance. If you don't receive a letter, please call our office to schedule the follow-up appointment.   

## 2016-09-21 DIAGNOSIS — Z794 Long term (current) use of insulin: Secondary | ICD-10-CM | POA: Diagnosis not present

## 2016-09-21 DIAGNOSIS — Z961 Presence of intraocular lens: Secondary | ICD-10-CM | POA: Diagnosis not present

## 2016-09-21 DIAGNOSIS — H43391 Other vitreous opacities, right eye: Secondary | ICD-10-CM | POA: Diagnosis not present

## 2016-09-21 DIAGNOSIS — E119 Type 2 diabetes mellitus without complications: Secondary | ICD-10-CM | POA: Diagnosis not present

## 2016-09-21 DIAGNOSIS — H401131 Primary open-angle glaucoma, bilateral, mild stage: Secondary | ICD-10-CM | POA: Diagnosis not present

## 2016-09-21 LAB — HM DIABETES EYE EXAM

## 2016-09-26 ENCOUNTER — Other Ambulatory Visit: Payer: Self-pay | Admitting: Family Medicine

## 2016-10-06 DIAGNOSIS — L84 Corns and callosities: Secondary | ICD-10-CM | POA: Diagnosis not present

## 2016-10-06 DIAGNOSIS — E1351 Other specified diabetes mellitus with diabetic peripheral angiopathy without gangrene: Secondary | ICD-10-CM | POA: Diagnosis not present

## 2016-10-06 DIAGNOSIS — L602 Onychogryphosis: Secondary | ICD-10-CM | POA: Diagnosis not present

## 2016-10-07 ENCOUNTER — Other Ambulatory Visit: Payer: Self-pay | Admitting: "Endocrinology

## 2016-10-07 DIAGNOSIS — Z794 Long term (current) use of insulin: Secondary | ICD-10-CM | POA: Diagnosis not present

## 2016-10-07 DIAGNOSIS — N181 Chronic kidney disease, stage 1: Secondary | ICD-10-CM | POA: Diagnosis not present

## 2016-10-07 DIAGNOSIS — E1122 Type 2 diabetes mellitus with diabetic chronic kidney disease: Secondary | ICD-10-CM | POA: Diagnosis not present

## 2016-10-07 LAB — COMPREHENSIVE METABOLIC PANEL
ALT: 26 U/L (ref 6–29)
AST: 24 U/L (ref 10–35)
Albumin: 4 g/dL (ref 3.6–5.1)
Alkaline Phosphatase: 48 U/L (ref 33–130)
BUN: 15 mg/dL (ref 7–25)
CO2: 30 mmol/L (ref 20–31)
Calcium: 9.9 mg/dL (ref 8.6–10.4)
Chloride: 93 mmol/L — ABNORMAL LOW (ref 98–110)
Creat: 1.05 mg/dL — ABNORMAL HIGH (ref 0.60–0.93)
Glucose, Bld: 222 mg/dL — ABNORMAL HIGH (ref 65–99)
Potassium: 4.4 mmol/L (ref 3.5–5.3)
Sodium: 132 mmol/L — ABNORMAL LOW (ref 135–146)
Total Bilirubin: 0.5 mg/dL (ref 0.2–1.2)
Total Protein: 7 g/dL (ref 6.1–8.1)

## 2016-10-08 LAB — HEMOGLOBIN A1C
Hgb A1c MFr Bld: 8.7 % — ABNORMAL HIGH (ref <5.7)
Mean Plasma Glucose: 203 mg/dL

## 2016-10-17 ENCOUNTER — Encounter: Payer: Self-pay | Admitting: "Endocrinology

## 2016-10-17 ENCOUNTER — Ambulatory Visit (INDEPENDENT_AMBULATORY_CARE_PROVIDER_SITE_OTHER): Payer: Medicare Other | Admitting: "Endocrinology

## 2016-10-17 VITALS — BP 130/74 | HR 64 | Ht 65.0 in | Wt 180.0 lb

## 2016-10-17 DIAGNOSIS — E1122 Type 2 diabetes mellitus with diabetic chronic kidney disease: Secondary | ICD-10-CM

## 2016-10-17 DIAGNOSIS — I10 Essential (primary) hypertension: Secondary | ICD-10-CM

## 2016-10-17 DIAGNOSIS — N181 Chronic kidney disease, stage 1: Secondary | ICD-10-CM | POA: Diagnosis not present

## 2016-10-17 DIAGNOSIS — E6609 Other obesity due to excess calories: Secondary | ICD-10-CM | POA: Diagnosis not present

## 2016-10-17 DIAGNOSIS — Z6830 Body mass index (BMI) 30.0-30.9, adult: Secondary | ICD-10-CM

## 2016-10-17 DIAGNOSIS — E782 Mixed hyperlipidemia: Secondary | ICD-10-CM | POA: Diagnosis not present

## 2016-10-17 DIAGNOSIS — Z794 Long term (current) use of insulin: Secondary | ICD-10-CM | POA: Diagnosis not present

## 2016-10-17 MED ORDER — INSULIN DETEMIR 100 UNIT/ML FLEXPEN: 50.0000 [IU] | PEN_INJECTOR | Freq: Every day | SUBCUTANEOUS | 2 refills | Status: DC

## 2016-10-17 NOTE — Progress Notes (Signed)
Subjective:    Patient ID: Joanna Brown, female    DOB: August 29, 1940. Patient is being seen in consultation for management of diabetes requested by  Fayrene Helper, MD  Past Medical History:  Diagnosis Date  . Blood transfusion    1980  . Diabetes mellitus   . Dysrhythmia   . Female bladder prolapse   . GERD (gastroesophageal reflux disease)   . Heart disease   . Hyperlipidemia   . Hypertension    echo and stress 4/10 reports on chart, EKG ` LOV 9/12 on chart  . Mild aortic stenosis    Past Surgical History:  Procedure Laterality Date  . ABDOMINAL HYSTERECTOMY    . ANTERIOR AND POSTERIOR REPAIR  04/26/2011   Procedure: ANTERIOR (CYSTOCELE) AND POSTERIOR REPAIR (RECTOCELE);  Surgeon: Reece Packer, MD;  Location: [facility];  Service: Urology;  Laterality: N/A;  . CATARACT EXTRACTION Bilateral    with IOL  . CHOLECYSTECTOMY  2009  . COLONOSCOPY N/A 12/02/2013   Procedure: COLONOSCOPY;  Surgeon: Danie Binder, MD;  Location: [facility];  Service: Endoscopy;  Laterality: N/A;  9:30 AM - rescheduled to 8:30 - Doris to notify pt  . COLONOSCOPY W/ POLYPECTOMY    . LEFT HEART CATH  09/10/2008   normal coronary arteries, normal LV systolic function, EF 36% (Dr. [name])  . LYMPH NODE DISSECTION Right 1997   under arm  . NM MYOCAR PERF WALL MOTION  2010   dipyridamole - mild-mod in intenstiy perfusion defect in mid anterior, mid anteroseptal wall, EF 70%  . OVARY SURGERY     bilateral tumors removed  . THYROIDECTOMY    . THYROIDECTOMY  02/2008  . TRANSTHORACIC ECHOCARDIOGRAM  08/2011   EF=>55%, mild conc LVH; trace MR; mild TR; mild-mod AV calcification with mild valvular AV stenosis  . VAGINAL PROLAPSE REPAIR  04/26/2011   Procedure: VAGINAL VAULT SUSPENSION;  Surgeon: Reece Packer, MD;  Location: [facility];  Service: Urology;  Laterality: N/A;  with Graft  10x6   Social History   Social History  . Marital status: Married    Spouse name: [name]  . Number  of children: 1  . Years of education: Trade   Occupational History  . Retired    Social History Main Topics  . Smoking status: Never Smoker  . Smokeless tobacco: Never Used  . Alcohol use No     Comment: socially- none x 30 years  . Drug use: No  . Sexual activity: Yes   Other Topics Concern  . None   Social History Narrative   Patient lives at home with spouse.   Caffeine Use: 1 cup of coffee daily   Outpatient Encounter Prescriptions as of 10/17/2016  Medication Sig  . amLODipine (NORVASC) 10 MG tablet Take 1 tablet (10 mg total) by mouth every morning.  Marland Kitchen aspirin 81 MG tablet Take 81 mg by mouth every morning.   . B-D ULTRAFINE III SHORT PEN 31G X 8 MM MISC USE AS DIRECTED WITH INSULIN PENS  . benazepril (LOTENSIN) 40 MG tablet Take 1 tablet (40 mg total) by mouth daily.  . Blood Glucose Monitoring Suppl (ONETOUCH VERIO) w/Device KIT 1 each by Does not apply route as needed.  . Calcium Carbonate-Vit D-Min (CALCIUM 1200) 1200-1000 MG-UNIT CHEW Chew 1 each by mouth daily.  . ferrous sulfate 325 (65 FE) MG tablet Take 325 mg by mouth daily with breakfast.  . glucose blood (ONE TOUCH ULTRA TEST) test  strip Use as instructed  . Insulin Detemir (LEVEMIR FLEXTOUCH [place]) Inject 50 Units into the skin at bedtime.  . metFORMIN (GLUCOPHAGE) 500 MG tablet Take 1 tablet (500 mg total) by mouth daily with breakfast.  . Multiple Vitamins-Minerals (CENTRUM) tablet Take 1 tablet by mouth every morning.   . Nutritional Supplements (NUTRITIONAL SUPPLEMENT PO) Take by mouth. Neurx-TF (formulation for peripheral nerve health)  . Omega-3 Fatty Acids (FISH OIL) 1200 MG CAPS Take 1 capsule by mouth every morning.   . pravastatin (PRAVACHOL) 80 MG tablet Take 1 tablet (80 mg total) by mouth daily.  . Travoprost, BAK Free, (TRAVATAN) 0.004 % SOLN ophthalmic solution Place 1 drop into both eyes at bedtime.   . triamterene-hydrochlorothiazide (MAXZIDE) 75-50 MG tablet Take 1 tablet by mouth daily.   No  facility-administered encounter medications on file as of 10/17/2016.    ALLERGIES: Allergies  Allergen Reactions  . Spironolactone     Stomach problems, vision changes    VACCINATION STATUS: Immunization History  Administered Date(s) Administered  . Pneumococcal Conjugate-13 12/11/2013  . Pneumococcal Polysaccharide-23 01/13/2010  . Tdap 10/05/2010    Diabetes  She presents for her follow-up diabetic visit. She has type 2 diabetes mellitus. Onset time: She was diagnosed at approximate age of 76 years. Her disease course has been stable. There are no hypoglycemic associated symptoms. Pertinent negatives for hypoglycemia include no confusion, headaches, pallor or seizures. Associated symptoms include blurred vision and fatigue. Pertinent negatives for diabetes include no chest pain, no polydipsia, no polyphagia and no polyuria. There are no hypoglycemic complications. Symptoms are stable. Diabetic complications include nephropathy and retinopathy. Risk factors for coronary artery disease include dyslipidemia, diabetes mellitus, obesity and sedentary lifestyle. Her weight is increasing steadily. She is following a generally unhealthy diet. When asked about meal planning, she reported none. She has had a previous visit with a dietitian. She rarely participates in exercise. (She did not bring any meter , nor logs.) Eye exam is current.  Hyperlipidemia  This is a chronic problem. The current episode started more than 1 year ago. Exacerbating diseases include diabetes and obesity. Pertinent negatives include no chest pain, myalgias or shortness of breath. Current antihyperlipidemic treatment includes statins. Risk factors for coronary artery disease include dyslipidemia, diabetes mellitus, hypertension, obesity and a sedentary lifestyle.  Hypertension  This is a chronic problem. The current episode started more than 1 year ago. Associated symptoms include blurred vision. Pertinent negatives include no  chest pain, headaches, palpitations or shortness of breath. Risk factors for coronary artery disease include dyslipidemia, diabetes mellitus, obesity and sedentary lifestyle. Hypertensive end-organ damage includes retinopathy.       Review of Systems  Constitutional: Positive for fatigue. Negative for chills, fever and unexpected weight change.  HENT: Negative for trouble swallowing and voice change.   Eyes: Positive for blurred vision. Negative for visual disturbance.  Respiratory: Negative for cough, shortness of breath and wheezing.   Cardiovascular: Negative for chest pain, palpitations and leg swelling.  Gastrointestinal: Negative for diarrhea, nausea and vomiting.  Endocrine: Negative for cold intolerance, heat intolerance, polydipsia, polyphagia and polyuria.  Musculoskeletal: Negative for arthralgias and myalgias.  Skin: Negative for color change, pallor, rash and wound.  Neurological: Negative for seizures and headaches.  Psychiatric/Behavioral: Negative for confusion and suicidal ideas.    Objective:    BP 130/74   Pulse 64   Ht '5\' 5"'  (1.651 m)   Wt 180 lb (81.6 kg)   BMI 29.95 kg/m   Wt Readings from  Last 3 Encounters:  10/17/16 180 lb (81.6 kg)  09/12/16 181 lb 6.4 oz (82.3 kg)  09/05/16 181 lb (82.1 kg)    Physical Exam  Constitutional: She is oriented to person, place, and time. She appears well-developed.  HENT:  Head: Normocephalic and atraumatic.  Eyes: EOM are normal.  Neck: Normal range of motion. Neck supple. No tracheal deviation present. No thyromegaly present.  Cardiovascular: Normal rate and regular rhythm.   Pulmonary/Chest: Effort normal and breath sounds normal.  Abdominal: Soft. Bowel sounds are normal. There is no tenderness. There is no guarding.  Musculoskeletal: Normal range of motion. She exhibits no edema.  Neurological: She is alert and oriented to person, place, and time. She has normal reflexes. No cranial nerve deficit. Coordination  normal.  Skin: Skin is warm and dry. No rash noted. No erythema. No pallor.  Psychiatric: She has a normal mood and affect. Judgment normal.     CMP ( most recent) CMP     Component Value Date/Time   NA 132 (L) 10/07/2016 0723   K 4.4 10/07/2016 0723   CL 93 (L) 10/07/2016 0723   CO2 30 10/07/2016 0723   GLUCOSE 222 (H) 10/07/2016 0723   BUN 15 10/07/2016 0723   CREATININE 1.05 (H) 10/07/2016 0723   CALCIUM 9.9 10/07/2016 0723   PROT 7.0 10/07/2016 0723   ALBUMIN 4.0 10/07/2016 0723   AST 24 10/07/2016 0723   ALT 26 10/07/2016 0723   ALKPHOS 48 10/07/2016 0723   BILITOT 0.5 10/07/2016 0723   GFRNONAA 50 (L) 07/05/2016 0715   GFRAA 58 (L) 07/05/2016 0715     Diabetic Labs (most recent): Lab Results  Component Value Date   HGBA1C 8.7 (H) 10/07/2016   HGBA1C 8.7 (H) 07/05/2016   HGBA1C 8.1 (H) 04/04/2016     Lipid Panel ( most recent) Lipid Panel     Component Value Date/Time   CHOL 182 07/05/2016 0715   TRIG 214 (H) 07/05/2016 0715   HDL 39 (L) 07/05/2016 0715   CHOLHDL 4.7 07/05/2016 0715   VLDL 43 (H) 07/05/2016 0715   LDLCALC 100 (H) 07/05/2016 0715       Assessment & Plan:   1. Type 2 diabetes mellitus with stage 1 chronic kidney disease, with long-term current use of insulin ([place])   - Patient has currently uncontrolled symptomatic type 2 DM since  76 years of age,  with most recent A1c of 8.7 %. Recent labs reviewed, showing stage.   Her diabetes is complicated by CKD obesity/sedentary life and patient remains at a high risk for more acute and chronic complications of diabetes which include CAD, CVA, CKD, retinopathy, and neuropathy. These are all discussed in detail with the patient.  - I have counseled the patient on diet management and weight loss, by adopting a carbohydrate restricted/protein rich diet.  - Suggestion is made for patient to avoid simple carbohydrates   from their diet including Cakes , Desserts, Ice Cream,  Soda (  diet and regular)  , Sweet Tea , Candies,  Chips, Cookies, Artificial Sweeteners,   and "Sugar-free" Products . This will help patient to have stable blood glucose profile and potentially avoid unintended weight gain.  - I encouraged the patient to switch to  unprocessed or minimally processed complex starch and increased protein intake (animal or plant source), fruits, and vegetables.  - Patient is advised to stick to a routine mealtimes to eat 3 meals  a day and avoid unnecessary snacks ( to snack  only to correct hypoglycemia).  - The patient will be scheduled with Jearld Fenton, RDN, CDE for individualized DM education.  - I have approached patient with the following individualized plan to manage diabetes and patient agrees:   - Based on her glycemic profile, she would not require prandial insulin at this time. - I will increase her basal insulin Levemir to 40 units daily at bedtime, she will continue to monitor blood glucose twice a day-before breakfast and at bedtime.  -Patient is encouraged to call clinic for blood glucose levels less than 70 or above 300 mg /dl. - I will continue metformin 500 mg by mouth twice a day, therapeutically suitable for patient.. - I will discontinue glipizide, risk outweighs benefit for this patient. -Patient is not a candidate for  SGLT2 inhibitors due to CKD.  - Patient will be considered for incretin therapy as appropriate next visit. - Patient specific target  A1c;  LDL, HDL, Triglycerides, and  Waist Circumference were discussed in detail.  2) BP/HTN: Controlled. Continue current medications.  3) Lipids/HPL:   Uncontrolled, LDL at 100.   Patient is advised tocontinue statins. 4)  Weight/Diet: CDE Consult has been initiated , exercise, and detailed carbohydrates information provided.  5) Chronic Care/Health Maintenance:  -Patient is on ACEI/ARB and Statin medications and encouraged to continue to follow up with Ophthalmology, Podiatrist at least yearly or according to  recommendations, and advised to  stay away from smoking. I have recommended yearly flu vaccine and pneumonia vaccination at least every 5 years; moderate intensity exercise for up to 150 minutes weekly; and  sleep for at least 7 hours a day.  - 30 minutes of time was spent on the care of this patient , 50% of which was applied for counseling on diabetes complications and their preventions.  - Patient to bring meter and  blood glucose logs during her next visit.   - I advised patient to maintain close follow up with Fayrene Helper, MD for primary care needs.  Follow up plan: - Return in about 3 months (around 01/17/2017).  Glade Lloyd, MD Phone: [phone]  Fax: [phone]   10/17/2016, 10:43 AM

## 2016-10-17 NOTE — Progress Notes (Signed)
Subjective:    Patient ID: Joanna Brown, female    DOB: 1941/02/24. Patient is being seen  For follow-up for management of diabetes requested by  Fayrene Helper, MD  Past Medical History:  Diagnosis Date  . Blood transfusion    1980  . Diabetes mellitus   . Dysrhythmia   . Female bladder prolapse   . GERD (gastroesophageal reflux disease)   . Heart disease   . Hyperlipidemia   . Hypertension    echo and stress 4/10 reports on chart, EKG ` LOV 9/12 on chart  . Mild aortic stenosis    Past Surgical History:  Procedure Laterality Date  . ABDOMINAL HYSTERECTOMY    . ANTERIOR AND POSTERIOR REPAIR  04/26/2011   Procedure: ANTERIOR (CYSTOCELE) AND POSTERIOR REPAIR (RECTOCELE);  Surgeon: Reece Packer, MD;  Location: [facility];  Service: Urology;  Laterality: N/A;  . CATARACT EXTRACTION Bilateral    with IOL  . CHOLECYSTECTOMY  2009  . COLONOSCOPY N/A 12/02/2013   Procedure: COLONOSCOPY;  Surgeon: Danie Binder, MD;  Location: [facility];  Service: Endoscopy;  Laterality: N/A;  9:30 AM - rescheduled to 8:30 - Doris to notify pt  . COLONOSCOPY W/ POLYPECTOMY    . LEFT HEART CATH  09/10/2008   normal coronary arteries, normal LV systolic function, EF 16% (Dr. [name])  . LYMPH NODE DISSECTION Right 1997   under arm  . NM MYOCAR PERF WALL MOTION  2010   dipyridamole - mild-mod in intenstiy perfusion defect in mid anterior, mid anteroseptal wall, EF 70%  . OVARY SURGERY     bilateral tumors removed  . THYROIDECTOMY    . THYROIDECTOMY  02/2008  . TRANSTHORACIC ECHOCARDIOGRAM  08/2011   EF=>55%, mild conc LVH; trace MR; mild TR; mild-mod AV calcification with mild valvular AV stenosis  . VAGINAL PROLAPSE REPAIR  04/26/2011   Procedure: VAGINAL VAULT SUSPENSION;  Surgeon: Reece Packer, MD;  Location: [facility];  Service: Urology;  Laterality: N/A;  with Graft  10x6   Social History   Social History  . Marital status: Married    Spouse name: [name]  . Number of  children: 1  . Years of education: Trade   Occupational History  . Retired    Social History Main Topics  . Smoking status: Never Smoker  . Smokeless tobacco: Never Used  . Alcohol use No     Comment: socially- none x 30 years  . Drug use: No  . Sexual activity: Yes   Other Topics Concern  . None   Social History Narrative   Patient lives at home with spouse.   Caffeine Use: 1 cup of coffee daily   Outpatient Encounter Prescriptions as of 10/17/2016  Medication Sig  . amLODipine (NORVASC) 10 MG tablet Take 1 tablet (10 mg total) by mouth every morning.  Marland Kitchen aspirin 81 MG tablet Take 81 mg by mouth every morning.   . B-D ULTRAFINE III SHORT PEN 31G X 8 MM MISC USE AS DIRECTED WITH INSULIN PENS  . benazepril (LOTENSIN) 40 MG tablet Take 1 tablet (40 mg total) by mouth daily.  . Blood Glucose Monitoring Suppl (ONETOUCH VERIO) w/Device KIT 1 each by Does not apply route as needed.  . Calcium Carbonate-Vit D-Min (CALCIUM 1200) 1200-1000 MG-UNIT CHEW Chew 1 each by mouth daily.  . ferrous sulfate 325 (65 FE) MG tablet Take 325 mg by mouth daily with breakfast.  . glucose blood (ONE TOUCH ULTRA TEST)  test strip Use as instructed  . Insulin Detemir (LEVEMIR FLEXTOUCH) 100 UNIT/ML Pen Inject 50 Units into the skin at bedtime.  . metFORMIN (GLUCOPHAGE) 500 MG tablet Take 1 tablet (500 mg total) by mouth daily with breakfast.  . Multiple Vitamins-Minerals (CENTRUM) tablet Take 1 tablet by mouth every morning.   . Nutritional Supplements (NUTRITIONAL SUPPLEMENT PO) Take by mouth. Neurx-TF (formulation for peripheral nerve health)  . Omega-3 Fatty Acids (FISH OIL) 1200 MG CAPS Take 1 capsule by mouth every morning.   . pravastatin (PRAVACHOL) 80 MG tablet Take 1 tablet (80 mg total) by mouth daily.  . Travoprost, BAK Free, (TRAVATAN) 0.004 % SOLN ophthalmic solution Place 1 drop into both eyes at bedtime.   . triamterene-hydrochlorothiazide (MAXZIDE) 75-50 MG tablet Take 1 tablet by mouth daily.   . [DISCONTINUED] Insulin Detemir (LEVEMIR FLEXTOUCH ) Inject 50 Units into the skin at bedtime.   No facility-administered encounter medications on file as of 10/17/2016.    ALLERGIES: Allergies  Allergen Reactions  . Spironolactone     Stomach problems, vision changes    VACCINATION STATUS: Immunization History  Administered Date(s) Administered  . Pneumococcal Conjugate-13 12/11/2013  . Pneumococcal Polysaccharide-23 01/13/2010  . Tdap 10/05/2010    Diabetes  She presents for her follow-up diabetic visit. She has type 2 diabetes mellitus. Onset time: She was diagnosed at approximate age of 33 years. Her disease course has been stable. There are no hypoglycemic associated symptoms. Pertinent negatives for hypoglycemia include no confusion, headaches, pallor or seizures. Associated symptoms include blurred vision and fatigue. Pertinent negatives for diabetes include no chest pain, no polydipsia, no polyphagia and no polyuria. There are no hypoglycemic complications. Symptoms are stable. Diabetic complications include nephropathy and retinopathy. Risk factors for coronary artery disease include dyslipidemia, diabetes mellitus, obesity and sedentary lifestyle. Her weight is increasing steadily. She is following a generally unhealthy diet. When asked about meal planning, she reported none. She has had a previous visit with a dietitian. She rarely participates in exercise. Her breakfast blood glucose range is generally 140-180 mg/dl. Her lunch blood glucose range is generally 140-180 mg/dl. Her dinner blood glucose range is generally 140-180 mg/dl. Her overall blood glucose range is 140-180 mg/dl. Eye exam is current.  Hyperlipidemia  This is a chronic problem. The current episode started more than 1 year ago. Exacerbating diseases include diabetes and obesity. Pertinent negatives include no chest pain, myalgias or shortness of breath. Current antihyperlipidemic treatment includes statins. Risk  factors for coronary artery disease include dyslipidemia, diabetes mellitus, hypertension, obesity and a sedentary lifestyle.  Hypertension  This is a chronic problem. The current episode started more than 1 year ago. Associated symptoms include blurred vision. Pertinent negatives include no chest pain, headaches, palpitations or shortness of breath. Risk factors for coronary artery disease include dyslipidemia, diabetes mellitus, obesity and sedentary lifestyle. Hypertensive end-organ damage includes retinopathy.       Review of Systems  Constitutional: Positive for fatigue. Negative for chills, fever and unexpected weight change.  HENT: Negative for trouble swallowing and voice change.   Eyes: Positive for blurred vision. Negative for visual disturbance.  Respiratory: Negative for cough, shortness of breath and wheezing.   Cardiovascular: Negative for chest pain, palpitations and leg swelling.  Gastrointestinal: Negative for diarrhea, nausea and vomiting.  Endocrine: Negative for cold intolerance, heat intolerance, polydipsia, polyphagia and polyuria.  Musculoskeletal: Negative for arthralgias and myalgias.  Skin: Negative for color change, pallor, rash and wound.  Neurological: Negative for seizures and headaches.  Psychiatric/Behavioral: Negative for confusion and suicidal ideas.    Objective:    BP 130/74   Pulse 64   Ht _0  (1.651 m)   Wt 180 lb (81.6 kg)   BMI 29.95 kg/m   Wt Readings from Last 3 Encounters:  10/17/16 180 lb (81.6 kg)  09/12/16 181 lb 6.4 oz (82.3 kg)  09/05/16 181 lb (82.1 kg)    Physical Exam  Constitutional: She is oriented to person, place, and time. She appears well-developed.  HENT:  Head: Normocephalic and atraumatic.  Eyes: EOM are normal.  Neck: Normal range of motion. Neck supple. No tracheal deviation present. No thyromegaly present.  Cardiovascular: Normal rate and regular rhythm.   Pulmonary/Chest: Effort normal and breath sounds  normal.  Abdominal: Soft. Bowel sounds are normal. There is no tenderness. There is no guarding.  Musculoskeletal: Normal range of motion. She exhibits no edema.  Neurological: She is alert and oriented to person, place, and time. She has normal reflexes. No cranial nerve deficit. Coordination normal.  Skin: Skin is warm and dry. No rash noted. No erythema. No pallor.  Psychiatric: She has a normal mood and affect. Judgment normal.     CMP ( most recent) CMP     Component Value Date/Time   NA 132 (L) 10/07/2016 0723   K 4.4 10/07/2016 0723   CL 93 (L) 10/07/2016 0723   CO2 30 10/07/2016 0723   GLUCOSE 222 (H) 10/07/2016 0723   BUN 15 10/07/2016 0723   CREATININE 1.05 (H) 10/07/2016 0723   CALCIUM 9.9 10/07/2016 0723   PROT 7.0 10/07/2016 0723   ALBUMIN 4.0 10/07/2016 0723   AST 24 10/07/2016 0723   ALT 26 10/07/2016 0723   ALKPHOS 48 10/07/2016 0723   BILITOT 0.5 10/07/2016 0723   GFRNONAA 50 (L) 07/05/2016 0715   GFRAA 58 (L) 07/05/2016 0715     Diabetic Labs (most recent): Lab Results  Component Value Date   HGBA1C 8.7 (H) 10/07/2016   HGBA1C 8.7 (H) 07/05/2016   HGBA1C 8.1 (H) 04/04/2016     Lipid Panel ( most recent) Lipid Panel     Component Value Date/Time   CHOL 182 07/05/2016 0715   TRIG 214 (H) 07/05/2016 0715   HDL 39 (L) 07/05/2016 0715   CHOLHDL 4.7 07/05/2016 0715   VLDL 43 (H) 07/05/2016 0715   LDLCALC 100 (H) 07/05/2016 0715       Assessment & Plan:   1. Type 2 diabetes mellitus with stage 1 chronic kidney disease, with long-term current use of insulin ([place])   - Patient has currently uncontrolled symptomatic type 2 DM since  76 years of age. - She did not bring any meter nor logs with her today. Her A1c remains the same as last visit at 8.7%. Recent labs reviewed, showing stage.   Her diabetes is complicated by CKD obesity/sedentary life and patient remains at a high risk for more acute and chronic complications of diabetes which include  CAD, CVA, CKD, retinopathy, and neuropathy. These are all discussed in detail with the patient.  - I have counseled the patient on diet management and weight loss, by adopting a carbohydrate restricted/protein rich diet.  - Suggestion is made for patient to avoid simple carbohydrates   from their diet including Cakes , Desserts, Ice Cream,  Soda (  diet and regular) , Sweet Tea , Candies,  Chips, Cookies, Artificial Sweeteners,   and "Sugar-free" Products . This will help patient to have stable blood glucose profile  and potentially avoid unintended weight gain.  - I encouraged the patient to switch to  unprocessed or minimally processed complex starch and increased protein intake (animal or plant source), fruits, and vegetables.  - Patient is advised to stick to a routine mealtimes to eat 3 meals  a day and avoid unnecessary snacks ( to snack only to correct hypoglycemia).  - The patient will be scheduled with Jearld Fenton, RDN, CDE for individualized DM education.  - I have approached patient with the following individualized plan to manage diabetes and patient agrees:   - I will increase her basal insulin Levemir to 50 units daily at bedtime, she will continue to monitor blood glucose twice a day-before breakfast and at bedtime.  -Patient is encouraged to call clinic for blood glucose levels less than 70 or above 300 mg /dl. - I will continue metformin 500 mg by mouth twice a day, therapeutically suitable for patient.  -Patient is not a candidate for  SGLT2 inhibitors due to CKD.  - Patient will be considered for incretin therapy as appropriate next visit. - Patient specific target  A1c;  LDL, HDL, Triglycerides, and  Waist Circumference were discussed in detail.  2) BP/HTN: Controlled. Continue current medications.  3) Lipids/HPL:   Uncontrolled, LDL at 100.   Patient is advised tocontinue statins. 4)  Weight/Diet: CDE Consult has been initiated , exercise, and detailed carbohydrates  information provided.  5) Chronic Care/Health Maintenance:  -Patient is on ACEI/ARB and Statin medications and encouraged to continue to follow up with Ophthalmology, Podiatrist at least yearly or according to recommendations, and advised to  stay away from smoking. I have recommended yearly flu vaccine and pneumonia vaccination at least every 5 years; moderate intensity exercise for up to 150 minutes weekly; and  sleep for at least 7 hours a day.  - 30 minutes of time was spent on the care of this patient , 50% of which was applied for counseling on diabetes complications and their preventions.  - Patient to bring meter and  blood glucose logs during her next visit.   - I advised patient to maintain close follow up with Fayrene Helper, MD for primary care needs.  Follow up plan: - Return in about 3 months (around 01/17/2017) for follow up with pre-visit labs, meter, and logs.  Glade Lloyd, MD Phone: [phone]  Fax: [phone]   10/17/2016, 10:47 AM

## 2016-10-17 NOTE — Patient Instructions (Signed)

## 2016-10-20 ENCOUNTER — Ambulatory Visit (HOSPITAL_COMMUNITY): Payer: Medicare Other | Attending: Internal Medicine

## 2016-10-20 ENCOUNTER — Other Ambulatory Visit: Payer: Self-pay

## 2016-10-20 DIAGNOSIS — I083 Combined rheumatic disorders of mitral, aortic and tricuspid valves: Secondary | ICD-10-CM | POA: Diagnosis not present

## 2016-10-20 DIAGNOSIS — E119 Type 2 diabetes mellitus without complications: Secondary | ICD-10-CM | POA: Insufficient documentation

## 2016-10-20 DIAGNOSIS — E785 Hyperlipidemia, unspecified: Secondary | ICD-10-CM | POA: Insufficient documentation

## 2016-10-20 DIAGNOSIS — I10 Essential (primary) hypertension: Secondary | ICD-10-CM | POA: Insufficient documentation

## 2016-10-20 DIAGNOSIS — E669 Obesity, unspecified: Secondary | ICD-10-CM | POA: Insufficient documentation

## 2016-10-20 DIAGNOSIS — I350 Nonrheumatic aortic (valve) stenosis: Secondary | ICD-10-CM

## 2016-10-20 DIAGNOSIS — Z6835 Body mass index (BMI) 35.0-35.9, adult: Secondary | ICD-10-CM | POA: Insufficient documentation

## 2016-10-21 ENCOUNTER — Telehealth: Payer: Self-pay | Admitting: Cardiovascular Disease

## 2016-10-21 NOTE — Telephone Encounter (Signed)
Notes recorded by Sanda Klein, MD on 10/20/2016 at 1:24 PM EDT Normal heart pumping function, moderate narrowing of the aortic valve, mild narrowing of the mitral valve, neither one likely to cause problems in the short term  Patient aware of results.

## 2016-10-21 NOTE — Telephone Encounter (Signed)
Pt returning call to [facility] regarding Echo results-

## 2016-11-16 ENCOUNTER — Other Ambulatory Visit: Payer: Self-pay | Admitting: "Endocrinology

## 2016-11-24 DIAGNOSIS — Z79899 Other long term (current) drug therapy: Secondary | ICD-10-CM | POA: Diagnosis not present

## 2016-11-24 DIAGNOSIS — E2839 Other primary ovarian failure: Secondary | ICD-10-CM | POA: Diagnosis not present

## 2016-11-24 DIAGNOSIS — Z794 Long term (current) use of insulin: Secondary | ICD-10-CM | POA: Diagnosis not present

## 2016-11-24 DIAGNOSIS — E119 Type 2 diabetes mellitus without complications: Secondary | ICD-10-CM | POA: Diagnosis not present

## 2016-11-24 DIAGNOSIS — Z780 Asymptomatic menopausal state: Secondary | ICD-10-CM | POA: Diagnosis not present

## 2016-11-24 DIAGNOSIS — E7800 Pure hypercholesterolemia, unspecified: Secondary | ICD-10-CM | POA: Diagnosis not present

## 2016-11-24 DIAGNOSIS — I10 Essential (primary) hypertension: Secondary | ICD-10-CM | POA: Diagnosis not present

## 2016-12-22 DIAGNOSIS — L602 Onychogryphosis: Secondary | ICD-10-CM | POA: Diagnosis not present

## 2016-12-22 DIAGNOSIS — E1351 Other specified diabetes mellitus with diabetic peripheral angiopathy without gangrene: Secondary | ICD-10-CM | POA: Diagnosis not present

## 2017-01-02 ENCOUNTER — Other Ambulatory Visit: Payer: Self-pay | Admitting: Family Medicine

## 2017-01-02 NOTE — Telephone Encounter (Signed)
Seen 07/19/16.

## 2017-01-03 ENCOUNTER — Telehealth: Payer: Self-pay

## 2017-01-03 DIAGNOSIS — I10 Essential (primary) hypertension: Secondary | ICD-10-CM

## 2017-01-03 DIAGNOSIS — E782 Mixed hyperlipidemia: Secondary | ICD-10-CM

## 2017-01-03 DIAGNOSIS — E1122 Type 2 diabetes mellitus with diabetic chronic kidney disease: Secondary | ICD-10-CM

## 2017-01-03 DIAGNOSIS — Z794 Long term (current) use of insulin: Secondary | ICD-10-CM

## 2017-01-03 DIAGNOSIS — N181 Chronic kidney disease, stage 1: Secondary | ICD-10-CM

## 2017-01-03 NOTE — Telephone Encounter (Signed)
Labs ordered.

## 2017-01-11 ENCOUNTER — Encounter: Payer: Self-pay | Admitting: Family Medicine

## 2017-01-11 DIAGNOSIS — H401131 Primary open-angle glaucoma, bilateral, mild stage: Secondary | ICD-10-CM | POA: Diagnosis not present

## 2017-01-17 ENCOUNTER — Ambulatory Visit: Payer: Medicare Other | Admitting: "Endocrinology

## 2017-01-18 DIAGNOSIS — Z794 Long term (current) use of insulin: Secondary | ICD-10-CM | POA: Diagnosis not present

## 2017-01-18 DIAGNOSIS — N181 Chronic kidney disease, stage 1: Secondary | ICD-10-CM | POA: Diagnosis not present

## 2017-01-18 DIAGNOSIS — E1122 Type 2 diabetes mellitus with diabetic chronic kidney disease: Secondary | ICD-10-CM | POA: Diagnosis not present

## 2017-01-19 LAB — HEMOGLOBIN A1C
Hgb A1c MFr Bld: 9 % of total Hgb — ABNORMAL HIGH (ref <5.7)
Mean Plasma Glucose: 212 (calc)
eAG (mmol/L): 11.7 (calc)

## 2017-01-19 LAB — RENAL FUNCTION PANEL
Albumin: 4.2 g/dL (ref 3.6–5.1)
BUN/Creatinine Ratio: 17 (calc) (ref 6–22)
BUN: 18 mg/dL (ref 7–25)
CO2: 30 mmol/L (ref 20–32)
Calcium: 9.9 mg/dL (ref 8.6–10.4)
Chloride: 98 mmol/L (ref 98–110)
Creat: 1.08 mg/dL — ABNORMAL HIGH (ref 0.60–0.93)
Glucose, Bld: 194 mg/dL — ABNORMAL HIGH (ref 65–99)
Phosphorus: 3.4 mg/dL (ref 2.1–4.3)
Potassium: 4 mmol/L (ref 3.5–5.3)
Sodium: 136 mmol/L (ref 135–146)

## 2017-01-23 ENCOUNTER — Other Ambulatory Visit: Payer: Self-pay | Admitting: "Endocrinology

## 2017-01-23 MED ORDER — INSULIN DETEMIR 100 UNIT/ML FLEXPEN: 50.0000 [IU] | PEN_INJECTOR | Freq: Every day | SUBCUTANEOUS | 0 refills | Status: DC

## 2017-01-26 ENCOUNTER — Other Ambulatory Visit: Payer: Self-pay | Admitting: Family Medicine

## 2017-01-27 ENCOUNTER — Ambulatory Visit: Payer: Medicare Other | Admitting: "Endocrinology

## 2017-01-30 ENCOUNTER — Other Ambulatory Visit: Payer: Self-pay | Admitting: Family Medicine

## 2017-01-31 ENCOUNTER — Telehealth: Payer: Self-pay | Admitting: Family Medicine

## 2017-01-31 NOTE — Telephone Encounter (Signed)
Patient requesting that her Rx for LEVEMIR be sent to [facility]   cb  [phone]

## 2017-02-01 MED ORDER — INSULIN DETEMIR 100 UNIT/ML FLEXPEN: 50.0000 [IU] | PEN_INJECTOR | Freq: Every day | SUBCUTANEOUS | 0 refills | Status: DC

## 2017-02-01 NOTE — Telephone Encounter (Signed)
Yes pls send

## 2017-02-06 ENCOUNTER — Ambulatory Visit (INDEPENDENT_AMBULATORY_CARE_PROVIDER_SITE_OTHER): Payer: Medicare Other | Admitting: "Endocrinology

## 2017-02-06 ENCOUNTER — Encounter: Payer: Self-pay | Admitting: "Endocrinology

## 2017-02-06 VITALS — BP 135/78 | HR 71 | Ht 65.0 in | Wt 177.0 lb

## 2017-02-06 DIAGNOSIS — E1122 Type 2 diabetes mellitus with diabetic chronic kidney disease: Secondary | ICD-10-CM

## 2017-02-06 DIAGNOSIS — N181 Chronic kidney disease, stage 1: Secondary | ICD-10-CM | POA: Diagnosis not present

## 2017-02-06 DIAGNOSIS — I10 Essential (primary) hypertension: Secondary | ICD-10-CM

## 2017-02-06 DIAGNOSIS — Z6830 Body mass index (BMI) 30.0-30.9, adult: Secondary | ICD-10-CM

## 2017-02-06 DIAGNOSIS — Z794 Long term (current) use of insulin: Secondary | ICD-10-CM | POA: Diagnosis not present

## 2017-02-06 DIAGNOSIS — E6609 Other obesity due to excess calories: Secondary | ICD-10-CM | POA: Diagnosis not present

## 2017-02-06 DIAGNOSIS — E782 Mixed hyperlipidemia: Secondary | ICD-10-CM | POA: Diagnosis not present

## 2017-02-06 MED ORDER — INSULIN DETEMIR 100 UNIT/ML FLEXPEN: 60.0000 [IU] | PEN_INJECTOR | Freq: Every day | SUBCUTANEOUS | 2 refills | Status: DC

## 2017-02-06 NOTE — Patient Instructions (Signed)

## 2017-02-06 NOTE — Progress Notes (Signed)
Subjective:    Patient ID: Joanna Brown, female    DOB: 08-21-1940. Patient is being seen  For follow-up for management of diabetes requested by  Fayrene Helper, MD  Past Medical History:  Diagnosis Date  . Blood transfusion    1980  . Diabetes mellitus   . Dysrhythmia   . Female bladder prolapse   . GERD (gastroesophageal reflux disease)   . Heart disease   . Hyperlipidemia   . Hypertension    echo and stress 4/10 reports on chart, EKG ` LOV 9/12 on chart  . Mild aortic stenosis    Past Surgical History:  Procedure Laterality Date  . ABDOMINAL HYSTERECTOMY    . ANTERIOR AND POSTERIOR REPAIR  04/26/2011   Procedure: ANTERIOR (CYSTOCELE) AND POSTERIOR REPAIR (RECTOCELE);  Surgeon: Reece Packer, MD;  Location: [facility];  Service: Urology;  Laterality: N/A;  . CATARACT EXTRACTION Bilateral    with IOL  . CHOLECYSTECTOMY  2009  . COLONOSCOPY N/A 12/02/2013   Procedure: COLONOSCOPY;  Surgeon: Danie Binder, MD;  Location: [facility];  Service: Endoscopy;  Laterality: N/A;  9:30 AM - rescheduled to 8:30 - Doris to notify pt  . COLONOSCOPY W/ POLYPECTOMY    . LEFT HEART CATH  09/10/2008   normal coronary arteries, normal LV systolic function, EF 50% (Dr. [name])  . LYMPH NODE DISSECTION Right 1997   under arm  . NM MYOCAR PERF WALL MOTION  2010   dipyridamole - mild-mod in intenstiy perfusion defect in mid anterior, mid anteroseptal wall, EF 70%  . OVARY SURGERY     bilateral tumors removed  . THYROIDECTOMY    . THYROIDECTOMY  02/2008  . TRANSTHORACIC ECHOCARDIOGRAM  08/2011   EF=>55%, mild conc LVH; trace MR; mild TR; mild-mod AV calcification with mild valvular AV stenosis  . VAGINAL PROLAPSE REPAIR  04/26/2011   Procedure: VAGINAL VAULT SUSPENSION;  Surgeon: Reece Packer, MD;  Location: [facility];  Service: Urology;  Laterality: N/A;  with Graft  10x6   Social History   Social History  . Marital status: Married    Spouse name: [name]  . Number of  children: 1  . Years of education: Trade   Occupational History  . Retired    Social History Main Topics  . Smoking status: Never Smoker  . Smokeless tobacco: Never Used  . Alcohol use No     Comment: socially- none x 30 years  . Drug use: No  . Sexual activity: Yes   Other Topics Concern  . None   Social History Narrative   Patient lives at home with spouse.   Caffeine Use: 1 cup of coffee daily   Outpatient Encounter Prescriptions as of 02/06/2017  Medication Sig  . amLODipine (NORVASC) 10 MG tablet TAKE 1 TABLET(10 MG) BY MOUTH EVERY MORNING  . aspirin 81 MG tablet Take 81 mg by mouth every morning.   . B-D ULTRAFINE III SHORT PEN 31G X 8 MM MISC USE AS DIRECTED WITH INSULIN PENS  . benazepril (LOTENSIN) 40 MG tablet TAKE 1 TABLET(40 MG) BY MOUTH DAILY  . Calcium Carbonate-Vit D-Min (CALCIUM 1200) 1200-1000 MG-UNIT CHEW Chew 1 each by mouth daily.  . ferrous sulfate 325 (65 FE) MG tablet Take 325 mg by mouth daily with breakfast.  . glucose blood (ONE TOUCH ULTRA TEST) test strip Use as instructed  . Insulin Detemir (LEVEMIR FLEXTOUCH) 100 UNIT/ML Pen Inject 60 Units into the skin at bedtime.  Marland Kitchen  metFORMIN (GLUCOPHAGE) 500 MG tablet Take 1 tablet (500 mg total) by mouth daily with breakfast.  . Multiple Vitamins-Minerals (CENTRUM) tablet Take 1 tablet by mouth every morning.   . Nutritional Supplements (NUTRITIONAL SUPPLEMENT PO) Take by mouth. Neurx-TF (formulation for peripheral nerve health)  . Omega-3 Fatty Acids (FISH OIL) 1200 MG CAPS Take 1 capsule by mouth every morning.   Glory Rosebush DELICA LANCETS 78H MISC USE TWICE DAILY TO TEST BLOOD SUGAR  . pravastatin (PRAVACHOL) 80 MG tablet Take 1 tablet (80 mg total) by mouth daily.  . Travoprost, BAK Free, (TRAVATAN) 0.004 % SOLN ophthalmic solution Place 1 drop into both eyes at bedtime.   . triamterene-hydrochlorothiazide (MAXZIDE) 75-50 MG tablet Take 1 tablet by mouth daily.  . [DISCONTINUED] Insulin Detemir (LEVEMIR  FLEXTOUCH) 100 UNIT/ML Pen Inject 50 Units into the skin at bedtime. (Patient taking differently: Inject 45 Units into the skin at bedtime. )   No facility-administered encounter medications on file as of 02/06/2017.    ALLERGIES: Allergies  Allergen Reactions  . Spironolactone     Stomach problems, vision changes    VACCINATION STATUS: Immunization History  Administered Date(s) Administered  . Pneumococcal Conjugate-13 12/11/2013  . Pneumococcal Polysaccharide-23 01/13/2010  . Tdap 10/05/2010    Diabetes  She presents for her follow-up diabetic visit. She has type 2 diabetes mellitus. Onset time: She was diagnosed at approximate age of 76 years. Her disease course has been worsening. There are no hypoglycemic associated symptoms. Pertinent negatives for hypoglycemia include no confusion, headaches, pallor or seizures. Associated symptoms include blurred vision and fatigue. Pertinent negatives for diabetes include no chest pain, no polydipsia, no polyphagia and no polyuria. There are no hypoglycemic complications. Symptoms are worsening. Diabetic complications include nephropathy and retinopathy. Risk factors for coronary artery disease include dyslipidemia, diabetes mellitus, obesity and sedentary lifestyle. Her weight is stable. She is following a generally unhealthy diet. When asked about meal planning, she reported none. She has had a previous visit with a dietitian. She rarely participates in exercise. Her breakfast blood glucose range is generally 180-200 mg/dl. Her lunch blood glucose range is generally 180-200 mg/dl. Her dinner blood glucose range is generally 180-200 mg/dl. Her bedtime blood glucose range is generally 180-200 mg/dl. Her overall blood glucose range is 180-200 mg/dl. Eye exam is current.  Hyperlipidemia  This is a chronic problem. The current episode started more than 1 year ago. Exacerbating diseases include diabetes and obesity. Pertinent negatives include no chest pain,  myalgias or shortness of breath. Current antihyperlipidemic treatment includes statins. Risk factors for coronary artery disease include dyslipidemia, diabetes mellitus, hypertension, obesity and a sedentary lifestyle.  Hypertension  This is a chronic problem. The current episode started more than 1 year ago. Associated symptoms include blurred vision. Pertinent negatives include no chest pain, headaches, palpitations or shortness of breath. Risk factors for coronary artery disease include dyslipidemia, diabetes mellitus, obesity and sedentary lifestyle. Hypertensive end-organ damage includes retinopathy.     Review of Systems  Constitutional: Positive for fatigue. Negative for chills, fever and unexpected weight change.  HENT: Negative for trouble swallowing and voice change.   Eyes: Positive for blurred vision. Negative for visual disturbance.  Respiratory: Negative for cough, shortness of breath and wheezing.   Cardiovascular: Negative for chest pain, palpitations and leg swelling.  Gastrointestinal: Negative for diarrhea, nausea and vomiting.  Endocrine: Negative for cold intolerance, heat intolerance, polydipsia, polyphagia and polyuria.  Musculoskeletal: Negative for arthralgias and myalgias.  Skin: Negative for color change, pallor,  rash and wound.  Neurological: Negative for seizures and headaches.  Psychiatric/Behavioral: Negative for confusion and suicidal ideas.    Objective:    BP 135/78   Pulse 71   Ht 5\' 5"  (1.651 m)   Wt 177 lb (80.3 kg)   BMI 29.45 kg/m   Wt Readings from Last 3 Encounters:  02/06/17 177 lb (80.3 kg)  10/17/16 180 lb (81.6 kg)  09/12/16 181 lb 6.4 oz (82.3 kg)    Physical Exam  Constitutional: She is oriented to person, place, and time. She appears well-developed.  HENT:  Head: Normocephalic and atraumatic.  Eyes: EOM are normal.  Neck: Normal range of motion. Neck supple. No tracheal deviation present. No thyromegaly present.  Cardiovascular:  Normal rate and regular rhythm.   Pulmonary/Chest: Effort normal and breath sounds normal.  Abdominal: Soft. Bowel sounds are normal. There is no tenderness. There is no guarding.  Musculoskeletal: Normal range of motion. She exhibits no edema.  Neurological: She is alert and oriented to person, place, and time. She has normal reflexes. No cranial nerve deficit. Coordination normal.  Skin: Skin is warm and dry. No rash noted. No erythema. No pallor.  Psychiatric: She has a normal mood and affect. Judgment normal.    CMP ( most recent) CMP     Component Value Date/Time   NA 136 01/18/2017 0715   K 4.0 01/18/2017 0715   CL 98 01/18/2017 0715   CO2 30 01/18/2017 0715   GLUCOSE 194 (H) 01/18/2017 0715   BUN 18 01/18/2017 0715   CREATININE 1.08 (H) 01/18/2017 0715   CALCIUM 9.9 01/18/2017 0715   PROT 7.0 10/07/2016 0723   ALBUMIN 4.0 10/07/2016 0723   AST 24 10/07/2016 0723   ALT 26 10/07/2016 0723   ALKPHOS 48 10/07/2016 0723   BILITOT 0.5 10/07/2016 0723   GFRNONAA 50 (L) 07/05/2016 0715   GFRAA 58 (L) 07/05/2016 0715     Diabetic Labs (most recent): Lab Results  Component Value Date   HGBA1C 9.0 (H) 01/18/2017   HGBA1C 8.7 (H) 10/07/2016   HGBA1C 8.7 (H) 07/05/2016     Lipid Panel ( most recent) Lipid Panel     Component Value Date/Time   CHOL 182 07/05/2016 0715   TRIG 214 (H) 07/05/2016 0715   HDL 39 (L) 07/05/2016 0715   CHOLHDL 4.7 07/05/2016 0715   VLDL 43 (H) 07/05/2016 0715   LDLCALC 100 (H) 07/05/2016 0715       Assessment & Plan:   1. Type 2 diabetes mellitus with stage 1 chronic kidney disease, with long-term current use of insulin ([place])   - Patient has currently uncontrolled symptomatic type 2 DM since  76 years of age. - She did bring her logs, Showing average blood glucose of 200 mg/dL. Her A1c remains the same as last visit at 9%. Recent labs reviewed, showing stage.   Her diabetes is complicated by CKD obesity/sedentary life and patient  remains at a high risk for more acute and chronic complications of diabetes which include CAD, CVA, CKD, retinopathy, and neuropathy. These are all discussed in detail with the patient.  - I have counseled the patient on diet management and weight loss, by adopting a carbohydrate restricted/protein rich diet.  -Suggestion is made for her to avoid simple carbohydrates  from her diet including Cakes, Sweet Desserts, Ice Cream, Soda (diet and regular), Sweet Tea, Candies, Chips, Cookies, Store Bought Juices, Alcohol in Excess of  1-2 drinks a day, Artificial Sweeteners, and "Sugar-free" Products.  This will help patient to have stable blood glucose profile and potentially avoid unintended weight gain.   - I encouraged the patient to switch to  unprocessed or minimally processed complex starch and increased protein intake (animal or plant source), fruits, and vegetables.  - Patient is advised to stick to a routine mealtimes to eat 3 meals  a day and avoid unnecessary snacks ( to snack only to correct hypoglycemia).   - I have approached patient with the following individualized plan to manage diabetes and patient agrees:   - I will increase her basal insulin Levemir to 60 units daily at bedtime, she will continue to monitor blood glucose twice a day-before breakfast and at bedtime.  -Patient is encouraged to call clinic for blood glucose levels less than 70 or above 300 mg /dl. - I will continue metformin 500 mg by mouth twice a day, therapeutically suitable for patient. - Patient is hesitant to intensify treatment at this time.  -Patient is not a candidate for  SGLT2 inhibitors due to CKD.  - Patient will be considered for incretin therapy as appropriate next visit. - Patient specific target  A1c;  LDL, HDL, Triglycerides, and  Waist Circumference were discussed in detail.  2) BP/HTN: Controlled. Continue current medications.  3) Lipids/HPL:   Uncontrolled, LDL at 100.   Patient is advised  tocontinue statins. 4)  Weight/Diet: CDE Consult has been initiated , exercise, and detailed carbohydrates information provided.  5) Chronic Care/Health Maintenance:  -Patient is on ACEI/ARB and Statin medications and encouraged to continue to follow up with Ophthalmology, Podiatrist at least yearly or according to recommendations, and advised to  stay away from smoking. I have recommended yearly flu vaccine and pneumonia vaccination at least every 5 years; moderate intensity exercise for up to 150 minutes weekly; and  sleep for at least 7 hours a day.  -  Time spent with the patient: 25 min, of which >50% was spent in reviewing her sugar logs , discussing her hypo- and hyper-glycemic episodes, reviewing her current and  previous labs and insulin doses and developing a plan to avoid hypo- and hyper-glycemia.   - I advised patient to maintain close follow up with Fayrene Helper, MD for primary care needs.  Follow up plan: - Return in about 3 months (around 05/08/2017) for meter, and logs.  Glade Lloyd, MD Phone: [phone]  Fax: [phone]   This note was partially dictated with voice recognition software. Similar sounding words can be transcribed inadequately or may not  be corrected upon review.  02/06/2017, 10:21 AM

## 2017-02-19 ENCOUNTER — Other Ambulatory Visit: Payer: Self-pay | Admitting: Family Medicine

## 2017-02-20 ENCOUNTER — Other Ambulatory Visit: Payer: Self-pay | Admitting: Family Medicine

## 2017-02-20 NOTE — Telephone Encounter (Signed)
Patient is requesting refill for [facility]  Cb#: [phone]

## 2017-03-01 ENCOUNTER — Other Ambulatory Visit: Payer: Self-pay

## 2017-03-02 DIAGNOSIS — E1351 Other specified diabetes mellitus with diabetic peripheral angiopathy without gangrene: Secondary | ICD-10-CM | POA: Diagnosis not present

## 2017-03-02 DIAGNOSIS — L602 Onychogryphosis: Secondary | ICD-10-CM | POA: Diagnosis not present

## 2017-03-06 ENCOUNTER — Other Ambulatory Visit: Payer: Self-pay

## 2017-03-06 MED ORDER — INSULIN DETEMIR 100 UNIT/ML FLEXPEN: 60.0000 [IU] | PEN_INJECTOR | Freq: Every day | SUBCUTANEOUS | 0 refills | Status: DC

## 2017-03-15 DIAGNOSIS — E1122 Type 2 diabetes mellitus with diabetic chronic kidney disease: Secondary | ICD-10-CM | POA: Diagnosis not present

## 2017-03-15 DIAGNOSIS — N181 Chronic kidney disease, stage 1: Secondary | ICD-10-CM | POA: Diagnosis not present

## 2017-03-15 DIAGNOSIS — I10 Essential (primary) hypertension: Secondary | ICD-10-CM | POA: Diagnosis not present

## 2017-03-15 DIAGNOSIS — Z794 Long term (current) use of insulin: Secondary | ICD-10-CM | POA: Diagnosis not present

## 2017-03-15 DIAGNOSIS — E782 Mixed hyperlipidemia: Secondary | ICD-10-CM | POA: Diagnosis not present

## 2017-03-15 LAB — CBC
HCT: 38.1 % (ref 35.0–45.0)
Hemoglobin: 12.9 g/dL (ref 11.7–15.5)
MCH: 28.3 pg (ref 27.0–33.0)
MCHC: 33.9 g/dL (ref 32.0–36.0)
MCV: 83.6 fL (ref 80.0–100.0)
MPV: 11.2 fL (ref 7.5–12.5)
Platelets: 217 10*3/uL (ref 140–400)
RBC: 4.56 10*6/uL (ref 3.80–5.10)
RDW: 13 % (ref 11.0–15.0)
WBC: 5.7 10*3/uL (ref 3.8–10.8)

## 2017-03-15 LAB — LIPID PANEL
Cholesterol: 171 mg/dL (ref <200)
HDL: 40 mg/dL — ABNORMAL LOW (ref >50)
LDL Cholesterol (Calc): 97 mg/dL (calc)
Non-HDL Cholesterol (Calc): 131 mg/dL (calc) — ABNORMAL HIGH (ref <130)
Total CHOL/HDL Ratio: 4.3 (calc) (ref <5.0)
Triglycerides: 227 mg/dL — ABNORMAL HIGH (ref <150)

## 2017-03-15 LAB — COMPLETE METABOLIC PANEL WITH GFR
AG Ratio: 1.4 (calc) (ref 1.0–2.5)
ALT: 23 U/L (ref 6–29)
AST: 25 U/L (ref 10–35)
Albumin: 4.4 g/dL (ref 3.6–5.1)
Alkaline phosphatase (APISO): 49 U/L (ref 33–130)
BUN/Creatinine Ratio: 18 (calc) (ref 6–22)
BUN: 20 mg/dL (ref 7–25)
CO2: 31 mmol/L (ref 20–32)
Calcium: 9.9 mg/dL (ref 8.6–10.4)
Chloride: 98 mmol/L (ref 98–110)
Creat: 1.14 mg/dL — ABNORMAL HIGH (ref 0.60–0.93)
GFR, Est African American: 54 mL/min/{1.73_m2} — ABNORMAL LOW (ref >=60)
GFR, Est Non African American: 47 mL/min/{1.73_m2} — ABNORMAL LOW (ref >=60)
Globulin: 3.2 g/dL (calc) (ref 1.9–3.7)
Glucose, Bld: 206 mg/dL — ABNORMAL HIGH (ref 65–99)
Potassium: 4 mmol/L (ref 3.5–5.3)
Sodium: 137 mmol/L (ref 135–146)
Total Bilirubin: 0.5 mg/dL (ref 0.2–1.2)
Total Protein: 7.6 g/dL (ref 6.1–8.1)

## 2017-03-15 LAB — TSH: TSH: 3.29 mIU/L (ref 0.40–4.50)

## 2017-03-23 ENCOUNTER — Ambulatory Visit (INDEPENDENT_AMBULATORY_CARE_PROVIDER_SITE_OTHER): Payer: Medicare Other | Admitting: Family Medicine

## 2017-03-23 ENCOUNTER — Encounter: Payer: Self-pay | Admitting: Family Medicine

## 2017-03-23 VITALS — BP 122/78 | HR 70 | Resp 16 | Ht 65.0 in | Wt 179.0 lb

## 2017-03-23 DIAGNOSIS — E785 Hyperlipidemia, unspecified: Secondary | ICD-10-CM

## 2017-03-23 DIAGNOSIS — N181 Chronic kidney disease, stage 1: Secondary | ICD-10-CM | POA: Diagnosis not present

## 2017-03-23 DIAGNOSIS — I10 Essential (primary) hypertension: Secondary | ICD-10-CM | POA: Diagnosis not present

## 2017-03-23 DIAGNOSIS — E1122 Type 2 diabetes mellitus with diabetic chronic kidney disease: Secondary | ICD-10-CM

## 2017-03-23 DIAGNOSIS — Z0000 Encounter for general adult medical examination without abnormal findings: Secondary | ICD-10-CM | POA: Diagnosis not present

## 2017-03-23 DIAGNOSIS — Z794 Long term (current) use of insulin: Secondary | ICD-10-CM

## 2017-03-23 DIAGNOSIS — Z1211 Encounter for screening for malignant neoplasm of colon: Secondary | ICD-10-CM

## 2017-03-23 MED ORDER — BENAZEPRIL HCL 40 MG PO TABS: ORAL_TABLET | ORAL | 1 refills | Status: DC

## 2017-03-23 MED ORDER — METFORMIN HCL 500 MG PO TABS: 500.0000 mg | ORAL_TABLET | Freq: Every day | ORAL | 1 refills | Status: DC

## 2017-03-23 MED ORDER — TRIAMTERENE-HCTZ 75-50 MG PO TABS: 1.0000 | ORAL_TABLET | Freq: Every day | ORAL | 1 refills | Status: DC

## 2017-03-23 MED ORDER — AMLODIPINE BESYLATE 10 MG PO TABS: ORAL_TABLET | ORAL | 1 refills | Status: DC

## 2017-03-23 MED ORDER — PRAVASTATIN SODIUM 80 MG PO TABS: 80.0000 mg | ORAL_TABLET | Freq: Every day | ORAL | 1 refills | Status: DC

## 2017-03-23 NOTE — Patient Instructions (Addendum)
Wellness visit with nurse in March 2019  MD follow up in March  Fasting lipid cmp and EGFR  1 week before next visit  Please work on good  health habits so that your health will improve. 1. Commitment to daily physical activity for 30 to 60  minutes, if you are able to do this.  2. Commitment to wise food choices. Aim for half of your  food intake to be vegetable and fruit, one quarter starchy foods, and one quarter protein. Try to eat on a regular schedule  3 meals per day, snacking between meals should be limited to vegetables or fruits or small portions of nuts. 64 ounces of water per day is generally recommended, unless you have specific health conditions, like heart failure or kidney failure where you will need to limit fluid intake.  3. Commitment to sufficient and a  good quality of physical and mental rest daily, generally between 6 to 8 hours per day.  WITH PERSISTANCE AND PERSEVERANCE, THE IMPOSSIBLE , BECOMES THE NORM! It is important that you exercise regularly at least 30 minutes 5 times a week. If you develop chest pain, have severe difficulty breathing, or feel very tired, stop exercising immediately and seek medical attention

## 2017-03-23 NOTE — Progress Notes (Signed)
Joanna Brown     MRN: [id number]      DOB: [birth year]  HPI: Pt is here for annual exam/ f/u with rectal  and review of chronic conditions and recent labs  Immunization is reviewed , she refuses the influenza vaccine   PE: BP 122/78   Pulse 70   Resp 16   Ht 5\' 5"  (1.651 m)   Wt 179 lb (81.2 kg)   SpO2 97%   BMI 29.79 kg/m   Pleasant  female, alert and oriented x 3, in no cardio-pulmonary distress. Afebrile. HEENT No facial trauma or asymetry. Sinuses non tender.  Extra occullar muscles intact, pupils equally reactive to light. External ears normal, tympanic membranes clear. Oropharynx moist, no exudate. Neck: supple, no adenopathy,JVD or thyromegaly.No bruits.  Chest: Clear to ascultation bilaterally.No crackles or wheezes. Non tender to palpation  Breast: No asymetry,no masses or lumps. No tenderness. No nipple discharge or inversion. No axillary or supraclavicular adenopathy  Cardiovascular system; Heart sounds normal,  S1 and  S2 ,no S3.  No murmur, or thrill. Apical beat not displaced Peripheral pulses normal.  Abdomen: Soft, non tender, no organomegaly or masses. No bruits. Bowel sounds normal. No guarding, tenderness or rebound.  Rectal:  Normal sphincter tone. No rectal mass. Guaiac negative stool.  GU: Not examined, asymptomatic   Musculoskeletal exam: Full ROM of spine, hips , shoulders and knees. No deformity ,swelling or crepitus noted. No muscle wasting or atrophy.   Neurologic: Cranial nerves 2 to 12 intact. Power, tone ,sensation and reflexes normal throughout. No disturbance in gait. No tremor.  Skin: Intact, no ulceration, erythema , scaling or rash noted. Pigmentation normal throughout  Psych; Normal mood and affect. Judgement and concentration normal   Assessment & Plan:  Annual physical exam Annual exam as documented. Counseling done  re healthy lifestyle involving commitment to 150 minutes exercise per week, heart  healthy diet, and attaining healthy weight.The importance of adequate sleep also discussed. . Changes in health habits are decided on by the patient with goals and time frames  set for achieving them. Immunization and cancer screening needs are specifically addressed at this visit.   Type 2 diabetes mellitus with stage 1 chronic kidney disease, with long-term current use of insulin ([place]) Managed by endo ands uncontrolled  Joanna Brown is reminded of the importance of commitment to daily physical activity for 30 minutes or more, as able and the need to limit carbohydrate intake to 30 to 60 grams per meal to help with blood sugar control.   The need to take medication as prescribed, test blood sugar as directed, and to call between visits if there is a concern that blood sugar is uncontrolled is also discussed.   Joanna Brown is reminded of the importance of daily foot exam, annual eye examination, and good blood sugar, blood pressure and cholesterol control.  Diabetic Labs Latest Ref Rng & Units 03/15/2017 01/18/2017 10/07/2016 07/05/2016 05/13/2016  HbA1c <5.7 % of total Hgb - 9.0(H) 8.7(H) 8.7(H) -  Microalbumin Not estab mg/dL - - - - -  Micro/Creat Ratio <30 mcg/mg creat - - - - -  Chol <200 mg/dL 171 - - 182 -  HDL >50 mg/dL 40(L) - - 39(L) -  Calc LDL <100 mg/dL - - - 100(H) -  Triglycerides <150 mg/dL 227(H) - - 214(H) -  Creatinine 0.60 - 0.93 mg/dL 1.14(H) 1.08(H) 1.05(H) 1.09(H) 0.91   BP/Weight 03/23/2017 02/06/2017 10/17/2016 09/12/2016 09/05/2016 0/92/3300 11/18/2261  Systolic BP  122 135 130 134 530 051 102  Diastolic BP 78 78 74 80 77 73 82  Wt. (Lbs) 179 177 180 181.4 181 181 185  BMI 29.79 29.45 29.95 30.19 30.12 30.12 30.79   Foot/eye exam completion dates Latest Ref Rng & Units 03/23/2017 09/21/2016  Eye Exam No Retinopathy - No Retinopathy  Foot Form Completion - Done -        Essential hypertension Controlled, no change in medication DASH diet and commitment to daily  physical activity for a minimum of 30 minutes discussed and encouraged, as a part of hypertension management. The importance of attaining a healthy weight is also discussed.  BP/Weight 03/23/2017 02/06/2017 10/17/2016 09/12/2016 09/05/2016 05/26/7354 7/0/1410  Systolic BP 301 314 388 875 797 282 060  Diastolic BP 78 78 74 80 77 73 82  Wt. (Lbs) 179 177 180 181.4 181 181 185  BMI 29.79 29.45 29.95 30.19 30.12 30.12 30.79       Hyperlipidemia LDL goal <100 Hyperlipidemia:Low fat diet discussed and encouraged.   Lipid Panel  Lab Results  Component Value Date   CHOL 171 03/15/2017   HDL 40 (L) 03/15/2017   LDLCALC 100 (H) 07/05/2016   TRIG 227 (H) 03/15/2017   CHOLHDL 4.3 03/15/2017   Needs to reduce fried and fatty foods

## 2017-03-24 LAB — POC HEMOCCULT BLD/STL (OFFICE/1-CARD/DIAGNOSTIC): Fecal Occult Blood, POC: NEGATIVE

## 2017-03-24 NOTE — Addendum Note (Signed)
Addended by: Eual Fines on: 03/24/2017 08:31 AM   Modules accepted: Orders

## 2017-03-24 NOTE — Assessment & Plan Note (Signed)
Annual exam as documented. Counseling done  re healthy lifestyle involving commitment to 150 minutes exercise per week, heart healthy diet, and attaining healthy weight.The importance of adequate sleep also discussed. Changes in health habits are decided on by the patient with goals and time frames  set for achieving them. Immunization and cancer screening needs are specifically addressed at this visit. 

## 2017-03-24 NOTE — Assessment & Plan Note (Signed)
Hyperlipidemia:Low fat diet discussed and encouraged.   Lipid Panel  Lab Results  Component Value Date   CHOL 171 03/15/2017   HDL 40 (L) 03/15/2017   LDLCALC 100 (H) 07/05/2016   TRIG 227 (H) 03/15/2017   CHOLHDL 4.3 03/15/2017   Needs to reduce fried and fatty foods

## 2017-03-24 NOTE — Assessment & Plan Note (Addendum)
Managed by endo ands uncontrolled  Joanna Brown is reminded of the importance of commitment to daily physical activity for 30 minutes or more, as able and the need to limit carbohydrate intake to 30 to 60 grams per meal to help with blood sugar control.   The need to take medication as prescribed, test blood sugar as directed, and to call between visits if there is a concern that blood sugar is uncontrolled is also discussed.   Joanna Brown is reminded of the importance of daily foot exam, annual eye examination, and good blood sugar, blood pressure and cholesterol control.  Diabetic Labs Latest Ref Rng & Units 03/15/2017 01/18/2017 10/07/2016 07/05/2016 05/13/2016  HbA1c <5.7 % of total Hgb - 9.0(H) 8.7(H) 8.7(H) -  Microalbumin Not estab mg/dL - - - - -  Micro/Creat Ratio <30 mcg/mg creat - - - - -  Chol <200 mg/dL 171 - - 182 -  HDL >50 mg/dL 40(L) - - 39(L) -  Calc LDL <100 mg/dL - - - 100(H) -  Triglycerides <150 mg/dL 227(H) - - 214(H) -  Creatinine 0.60 - 0.93 mg/dL 1.14(H) 1.08(H) 1.05(H) 1.09(H) 0.91   BP/Weight 03/23/2017 02/06/2017 10/17/2016 09/12/2016 09/05/2016 1/95/0932 10/21/1243  Systolic BP 809 983 382 505 397 673 419  Diastolic BP 78 78 74 80 77 73 82  Wt. (Lbs) 179 177 180 181.4 181 181 185  BMI 29.79 29.45 29.95 30.19 30.12 30.12 30.79   Foot/eye exam completion dates Latest Ref Rng & Units 03/23/2017 09/21/2016  Eye Exam No Retinopathy - No Retinopathy  Foot Form Completion - Done -

## 2017-03-24 NOTE — Assessment & Plan Note (Signed)
Controlled, no change in medication DASH diet and commitment to daily physical activity for a minimum of 30 minutes discussed and encouraged, as a part of hypertension management. The importance of attaining a healthy weight is also discussed.  BP/Weight 03/23/2017 02/06/2017 10/17/2016 09/12/2016 09/05/2016 06/11/5168 0/05/7492  Systolic BP 496 759 163 846 659 935 701  Diastolic BP 78 78 74 80 77 73 82  Wt. (Lbs) 179 177 180 181.4 181 181 185  BMI 29.79 29.45 29.95 30.19 30.12 30.12 30.79

## 2017-04-19 DIAGNOSIS — H401131 Primary open-angle glaucoma, bilateral, mild stage: Secondary | ICD-10-CM | POA: Diagnosis not present

## 2017-05-04 DIAGNOSIS — E1122 Type 2 diabetes mellitus with diabetic chronic kidney disease: Secondary | ICD-10-CM | POA: Diagnosis not present

## 2017-05-04 DIAGNOSIS — N181 Chronic kidney disease, stage 1: Secondary | ICD-10-CM | POA: Diagnosis not present

## 2017-05-04 DIAGNOSIS — Z794 Long term (current) use of insulin: Secondary | ICD-10-CM | POA: Diagnosis not present

## 2017-05-05 LAB — COMPLETE METABOLIC PANEL WITH GFR
AG Ratio: 1.4 (calc) (ref 1.0–2.5)
ALT: 22 U/L (ref 6–29)
AST: 24 U/L (ref 10–35)
Albumin: 4.3 g/dL (ref 3.6–5.1)
Alkaline phosphatase (APISO): 49 U/L (ref 33–130)
BUN/Creatinine Ratio: 15 (calc) (ref 6–22)
BUN: 17 mg/dL (ref 7–25)
CO2: 29 mmol/L (ref 20–32)
Calcium: 10.2 mg/dL (ref 8.6–10.4)
Chloride: 98 mmol/L (ref 98–110)
Creat: 1.13 mg/dL — ABNORMAL HIGH (ref 0.60–0.93)
GFR, Est African American: 55 mL/min/{1.73_m2} — ABNORMAL LOW (ref >=60)
GFR, Est Non African American: 48 mL/min/{1.73_m2} — ABNORMAL LOW (ref >=60)
Globulin: 3 g/dL (calc) (ref 1.9–3.7)
Glucose, Bld: 171 mg/dL — ABNORMAL HIGH (ref 65–99)
Potassium: 4.2 mmol/L (ref 3.5–5.3)
Sodium: 137 mmol/L (ref 135–146)
Total Bilirubin: 0.5 mg/dL (ref 0.2–1.2)
Total Protein: 7.3 g/dL (ref 6.1–8.1)

## 2017-05-05 LAB — HEMOGLOBIN A1C
Hgb A1c MFr Bld: 9.3 % of total Hgb — ABNORMAL HIGH (ref <5.7)
Mean Plasma Glucose: 220 (calc)
eAG (mmol/L): 12.2 (calc)

## 2017-05-11 ENCOUNTER — Ambulatory Visit (INDEPENDENT_AMBULATORY_CARE_PROVIDER_SITE_OTHER): Payer: Medicare Other | Admitting: "Endocrinology

## 2017-05-11 ENCOUNTER — Encounter: Payer: Self-pay | Admitting: "Endocrinology

## 2017-05-11 VITALS — BP 137/78 | HR 70 | Ht 65.0 in | Wt 181.0 lb

## 2017-05-11 DIAGNOSIS — N181 Chronic kidney disease, stage 1: Secondary | ICD-10-CM | POA: Diagnosis not present

## 2017-05-11 DIAGNOSIS — Z794 Long term (current) use of insulin: Secondary | ICD-10-CM | POA: Diagnosis not present

## 2017-05-11 DIAGNOSIS — E782 Mixed hyperlipidemia: Secondary | ICD-10-CM | POA: Diagnosis not present

## 2017-05-11 DIAGNOSIS — I10 Essential (primary) hypertension: Secondary | ICD-10-CM

## 2017-05-11 DIAGNOSIS — E1122 Type 2 diabetes mellitus with diabetic chronic kidney disease: Secondary | ICD-10-CM

## 2017-05-11 DIAGNOSIS — Z6830 Body mass index (BMI) 30.0-30.9, adult: Secondary | ICD-10-CM

## 2017-05-11 DIAGNOSIS — E6609 Other obesity due to excess calories: Secondary | ICD-10-CM

## 2017-05-11 NOTE — Patient Instructions (Signed)

## 2017-05-11 NOTE — Progress Notes (Signed)
Subjective:    Patient ID: Joanna Brown, female    DOB: Mar 18, 1941. Patient is being seen  For follow-up for management of diabetes requested by  Fayrene Helper, MD  Past Medical History:  Diagnosis Date  . Blood transfusion    1980  . Diabetes mellitus   . Dysrhythmia   . Female bladder prolapse   . GERD (gastroesophageal reflux disease)   . Heart disease   . Hyperlipidemia   . Hypertension    echo and stress 4/10 reports on chart, EKG ` LOV 9/12 on chart  . Mild aortic stenosis    Past Surgical History:  Procedure Laterality Date  . ABDOMINAL HYSTERECTOMY    . ANTERIOR AND POSTERIOR REPAIR  04/26/2011   Procedure: ANTERIOR (CYSTOCELE) AND POSTERIOR REPAIR (RECTOCELE);  Surgeon: Reece Packer, MD;  Location: [facility];  Service: Urology;  Laterality: N/A;  . CATARACT EXTRACTION Bilateral    with IOL  . CHOLECYSTECTOMY  2009  . COLONOSCOPY N/A 12/02/2013   Procedure: COLONOSCOPY;  Surgeon: Danie Binder, MD;  Location: [facility];  Service: Endoscopy;  Laterality: N/A;  9:30 AM - rescheduled to 8:30 - Doris to notify pt  . COLONOSCOPY W/ POLYPECTOMY    . LEFT HEART CATH  09/10/2008   normal coronary arteries, normal LV systolic function, EF 95% (Dr. [name])  . LYMPH NODE DISSECTION Right 1997   under arm  . NM MYOCAR PERF WALL MOTION  2010   dipyridamole - mild-mod in intenstiy perfusion defect in mid anterior, mid anteroseptal wall, EF 70%  . OVARY SURGERY     bilateral tumors removed  . THYROIDECTOMY    . THYROIDECTOMY  02/2008  . TRANSTHORACIC ECHOCARDIOGRAM  08/2011   EF=>55%, mild conc LVH; trace MR; mild TR; mild-mod AV calcification with mild valvular AV stenosis  . VAGINAL PROLAPSE REPAIR  04/26/2011   Procedure: VAGINAL VAULT SUSPENSION;  Surgeon: Reece Packer, MD;  Location: [facility];  Service: Urology;  Laterality: N/A;  with Graft  10x6   Social History   Socioeconomic History  . Marital status: Married    Spouse name: [name]  .  Number of children: 1  . Years of education: Trade  . Highest education level: None  Social Needs  . Financial resource strain: None  . Food insecurity - worry: None  . Food insecurity - inability: None  . Transportation needs - medical: None  . Transportation needs - non-medical: None  Occupational History  . Occupation: Retired  Tobacco Use  . Smoking status: Never Smoker  . Smokeless tobacco: Never Used  Substance and Sexual Activity  . Alcohol use: No    Comment: socially- none x 30 years  . Drug use: No  . Sexual activity: Yes  Other Topics Concern  . None  Social History Narrative   Patient lives at home with spouse.   Caffeine Use: 1 cup of coffee daily   Outpatient Encounter Medications as of 05/11/2017  Medication Sig  . amLODipine (NORVASC) 10 MG tablet TAKE 1 TABLET(10 MG) BY MOUTH EVERY MORNING  . aspirin 81 MG tablet Take 81 mg by mouth every morning.   . B-D ULTRAFINE III SHORT PEN 31G X 8 MM MISC USE AS DIRECTED WITH INSULIN PENS  . benazepril (LOTENSIN) 40 MG tablet TAKE 1 TABLET(40 MG) BY MOUTH DAILY  . Calcium Carbonate-Vit D-Min (CALCIUM 1200) 1200-1000 MG-UNIT CHEW Chew 1 each by mouth daily.  . ferrous sulfate 325 (65 FE)  MG tablet Take 325 mg by mouth daily with breakfast.  . glucose blood (ONE TOUCH ULTRA TEST) test strip Use as instructed  . Insulin Detemir (LEVEMIR FLEXTOUCH) 100 UNIT/ML Pen Inject 60 Units into the skin at bedtime.  . metFORMIN (GLUCOPHAGE) 500 MG tablet Take 1 tablet (500 mg total) daily with breakfast by mouth.  . Multiple Vitamins-Minerals (CENTRUM) tablet Take 1 tablet by mouth every morning.   . Nutritional Supplements (NUTRITIONAL SUPPLEMENT PO) Take by mouth. Neurx-TF (formulation for peripheral nerve health)  . Omega-3 Fatty Acids (FISH OIL) 1200 MG CAPS Take 1 capsule by mouth every morning.   Glory Rosebush DELICA LANCETS 42H MISC USE TWICE DAILY TO TEST BLOOD SUGAR  . pravastatin (PRAVACHOL) 80 MG tablet Take 1 tablet (80 mg  total) daily by mouth.  . Travoprost, BAK Free, (TRAVATAN) 0.004 % SOLN ophthalmic solution Place 1 drop into both eyes at bedtime.   . triamterene-hydrochlorothiazide (MAXZIDE) 75-50 MG tablet Take 1 tablet daily by mouth.   No facility-administered encounter medications on file as of 05/11/2017.    ALLERGIES: Allergies  Allergen Reactions  . Spironolactone     Stomach problems, vision changes    VACCINATION STATUS: Immunization History  Administered Date(s) Administered  . Pneumococcal Conjugate-13 12/11/2013  . Pneumococcal Polysaccharide-23 01/13/2010  . Tdap 10/05/2010    Diabetes  She presents for her follow-up diabetic visit. She has type 2 diabetes mellitus. Onset time: She was diagnosed at approximate age of 37 years. Her disease course has been worsening. There are no hypoglycemic associated symptoms. Pertinent negatives for hypoglycemia include no confusion, headaches, pallor or seizures. Associated symptoms include blurred vision and fatigue. Pertinent negatives for diabetes include no chest pain, no polydipsia, no polyphagia and no polyuria. There are no hypoglycemic complications. Symptoms are worsening. Diabetic complications include nephropathy and retinopathy. Risk factors for coronary artery disease include dyslipidemia, diabetes mellitus, obesity and sedentary lifestyle. Her weight is increasing steadily. She is following a generally unhealthy diet. When asked about meal planning, she reported none. She has had a previous visit with a dietitian. She rarely participates in exercise. Her breakfast blood glucose range is generally 180-200 mg/dl. Her lunch blood glucose range is generally 180-200 mg/dl. Her dinner blood glucose range is generally 180-200 mg/dl. Her bedtime blood glucose range is generally 180-200 mg/dl. Her overall blood glucose range is 180-200 mg/dl. Eye exam is current.  Hyperlipidemia  This is a chronic problem. The current episode started more than 1 year  ago. Exacerbating diseases include diabetes and obesity. Pertinent negatives include no chest pain, myalgias or shortness of breath. Current antihyperlipidemic treatment includes statins. Risk factors for coronary artery disease include dyslipidemia, diabetes mellitus, hypertension, obesity and a sedentary lifestyle.  Hypertension  This is a chronic problem. The current episode started more than 1 year ago. Associated symptoms include blurred vision. Pertinent negatives include no chest pain, headaches, palpitations or shortness of breath. Risk factors for coronary artery disease include dyslipidemia, diabetes mellitus, obesity and sedentary lifestyle. Hypertensive end-organ damage includes retinopathy.    Review of Systems  Constitutional: Positive for fatigue. Negative for chills, fever and unexpected weight change.  HENT: Negative for trouble swallowing and voice change.   Eyes: Positive for blurred vision. Negative for visual disturbance.  Respiratory: Negative for cough, shortness of breath and wheezing.   Cardiovascular: Negative for chest pain, palpitations and leg swelling.  Gastrointestinal: Negative for diarrhea, nausea and vomiting.  Endocrine: Negative for cold intolerance, heat intolerance, polydipsia, polyphagia and polyuria.  Musculoskeletal:  Negative for arthralgias and myalgias.  Skin: Negative for color change, pallor, rash and wound.  Neurological: Negative for seizures and headaches.  Psychiatric/Behavioral: Negative for confusion and suicidal ideas.    Objective:    BP 137/78   Pulse 70   Ht 5\' 5"  (1.651 m)   Wt 181 lb (82.1 kg)   BMI 30.12 kg/m   Wt Readings from Last 3 Encounters:  05/11/17 181 lb (82.1 kg)  03/23/17 179 lb (81.2 kg)  02/06/17 177 lb (80.3 kg)    Physical Exam  Constitutional: She is oriented to person, place, and time. She appears well-developed.  HENT:  Head: Normocephalic and atraumatic.  Eyes: EOM are normal.  Neck: Normal range of  motion. Neck supple. No tracheal deviation present. No thyromegaly present.  Cardiovascular: Normal rate and regular rhythm.  Pulmonary/Chest: Effort normal and breath sounds normal.  Abdominal: Soft. Bowel sounds are normal. There is no tenderness. There is no guarding.  Musculoskeletal: Normal range of motion. She exhibits no edema.  Neurological: She is alert and oriented to person, place, and time. She has normal reflexes. No cranial nerve deficit. Coordination normal.  Skin: Skin is warm and dry. No rash noted. No erythema. No pallor.  Psychiatric: She has a normal mood and affect. Judgment normal.    CMP ( most recent) CMP     Component Value Date/Time   NA 137 05/04/2017 0822   K 4.2 05/04/2017 0822   CL 98 05/04/2017 0822   CO2 29 05/04/2017 0822   GLUCOSE 171 (H) 05/04/2017 0822   BUN 17 05/04/2017 0822   CREATININE 1.13 (H) 05/04/2017 0822   CALCIUM 10.2 05/04/2017 0822   PROT 7.3 05/04/2017 0822   ALBUMIN 4.0 10/07/2016 0723   AST 24 05/04/2017 0822   ALT 22 05/04/2017 0822   ALKPHOS 48 10/07/2016 0723   BILITOT 0.5 05/04/2017 0822   GFRNONAA 48 (L) 05/04/2017 0822   GFRAA 55 (L) 05/04/2017 0822     Diabetic Labs (most recent): Lab Results  Component Value Date   HGBA1C 9.3 (H) 05/04/2017   HGBA1C 9.0 (H) 01/18/2017   HGBA1C 8.7 (H) 10/07/2016    Lipid Panel     Component Value Date/Time   CHOL 171 03/15/2017 0742   TRIG 227 (H) 03/15/2017 0742   HDL 40 (L) 03/15/2017 0742   CHOLHDL 4.3 03/15/2017 0742   VLDL 43 (H) 07/05/2016 0715   LDLCALC 100 (H) 07/05/2016 0715     Assessment & Plan:   1. Type 2 diabetes mellitus with stage 1 chronic kidney disease, with long-term current use of insulin ([place])   - Patient has currently uncontrolled symptomatic type 2 DM since  76 years of age. - She did bring her logs, Showing  controlled fasting blood glucose profile however uncontrolled postprandial blood glucose profile. Her A1c is higher at 9.3%.   Recent  labs reviewed, showing stage.   Her diabetes is complicated by CKD obesity/sedentary life and patient remains at a high risk for more acute and chronic complications of diabetes which include CAD, CVA, CKD, retinopathy, and neuropathy. These are all discussed in detail with the patient.  - I have counseled the patient on diet management and weight loss, by adopting a carbohydrate restricted/protein rich diet.  -  Suggestion is made for her to avoid simple carbohydrates  from her diet including Cakes, Sweet Desserts / Pastries, Ice Cream, Soda (diet and regular), Sweet Tea, Candies, Chips, Cookies, Store Bought Juices, Alcohol in Excess of  1-2 drinks a day, Artificial Sweeteners, and "Sugar-free" Products. This will help patient to have stable blood glucose profile and potentially avoid unintended weight gain.   - I encouraged the patient to switch to  unprocessed or minimally processed complex starch and increased protein intake (animal or plant source), fruits, and vegetables.  - Patient is advised to stick to a routine mealtimes to eat 3 meals  a day and avoid unnecessary snacks ( to snack only to correct hypoglycemia).   - I have approached patient with the following individualized plan to manage diabetes and patient agrees:   -   based on her presentation with higher A1c of 9.3%, she will need basal/bolus insulin to control her diabetes target. - However, she is hesitant to do that  at this time. She is willing to initiate monitor blood glucose 4 times a day-before meals and at bedtime and return in 2 weeks with her meter and logs. - I will continue  her basal insulin Levemir  60 units daily at bedtime.  -Patient is encouraged to call clinic for blood glucose levels less than 70 or above 300 mg /dl. - I will continue metformin 500 mg by mouth twice a day, therapeutically suitable for patient. -Patient is not a candidate for  SGLT2 inhibitors due to CKD.  - Patient will be considered for  incretin therapy as appropriate next visit. - Patient specific target  A1c;  LDL, HDL, Triglycerides, and  Waist Circumference were discussed in detail.  2) BP/HTN: Controlled. I advised her to continue her current blood pressure medications, including Benazepril 40 mg by mouth daily.  3) Lipids/HPL:   Uncontrolled, LDL at 97.   Patient is advised tocontinue statins. 4)  Weight/Diet: CDE Consult has been initiated , exercise, and detailed carbohydrates information provided.  5) Chronic Care/Health Maintenance:  -Patient is on ACEI/ARB and Statin medications and encouraged to continue to follow up with Ophthalmology, Podiatrist at least yearly or according to recommendations, and advised to  stay away from smoking. I have recommended yearly flu vaccine and pneumonia vaccination at least every 5 years; moderate intensity exercise for up to 150 minutes weekly; and  sleep for at least 7 hours a day.  - Time spent with the patient: 25 min, of which >50% was spent in reviewing her sugar logs , discussing her hypo- and hyper-glycemic episodes, reviewing her current and  previous labs and insulin doses and developing a plan to avoid hypo- and hyper-glycemia.    - I advised patient to maintain close follow up with Fayrene Helper, MD for primary care needs.  Follow up plan: - Return in about 4 months (around 09/09/2017) for follow up with meter and logs- no labs.  Glade Lloyd, MD Phone: [phone]  Fax: [phone]   This note was partially dictated with voice recognition software. Similar sounding words can be transcribed inadequately or may not  be corrected upon review.  05/11/2017, 1:14 PM

## 2017-05-17 DIAGNOSIS — E1351 Other specified diabetes mellitus with diabetic peripheral angiopathy without gangrene: Secondary | ICD-10-CM | POA: Diagnosis not present

## 2017-05-17 DIAGNOSIS — L602 Onychogryphosis: Secondary | ICD-10-CM | POA: Diagnosis not present

## 2017-05-25 ENCOUNTER — Other Ambulatory Visit: Payer: Self-pay | Admitting: Family Medicine

## 2017-05-25 ENCOUNTER — Other Ambulatory Visit: Payer: Self-pay | Admitting: "Endocrinology

## 2017-05-26 ENCOUNTER — Encounter: Payer: Self-pay | Admitting: "Endocrinology

## 2017-05-26 ENCOUNTER — Ambulatory Visit (INDEPENDENT_AMBULATORY_CARE_PROVIDER_SITE_OTHER): Payer: Medicare Other | Admitting: "Endocrinology

## 2017-05-26 VITALS — BP 133/78 | HR 67 | Ht 65.0 in

## 2017-05-26 DIAGNOSIS — I10 Essential (primary) hypertension: Secondary | ICD-10-CM

## 2017-05-26 DIAGNOSIS — E6609 Other obesity due to excess calories: Secondary | ICD-10-CM | POA: Diagnosis not present

## 2017-05-26 DIAGNOSIS — N181 Chronic kidney disease, stage 1: Secondary | ICD-10-CM

## 2017-05-26 DIAGNOSIS — E782 Mixed hyperlipidemia: Secondary | ICD-10-CM | POA: Diagnosis not present

## 2017-05-26 DIAGNOSIS — E1122 Type 2 diabetes mellitus with diabetic chronic kidney disease: Secondary | ICD-10-CM

## 2017-05-26 DIAGNOSIS — Z6830 Body mass index (BMI) 30.0-30.9, adult: Secondary | ICD-10-CM

## 2017-05-26 DIAGNOSIS — Z794 Long term (current) use of insulin: Secondary | ICD-10-CM | POA: Diagnosis not present

## 2017-05-26 MED ORDER — INSULIN DETEMIR 100 UNIT/ML FLEXPEN: 60.0000 [IU] | PEN_INJECTOR | Freq: Every day | SUBCUTANEOUS | 0 refills | Status: DC

## 2017-05-26 MED ORDER — GLUCOSE BLOOD VI STRP: ORAL_STRIP | 1 refills | Status: DC

## 2017-05-26 NOTE — Progress Notes (Signed)
Subjective:    Patient ID: Joanna Brown, female    DOB: Dec 14, 1940. Patient is being seen  For follow-up for management of diabetes requested by  Fayrene Helper, MD  Past Medical History:  Diagnosis Date  . Blood transfusion    1980  . Diabetes mellitus   . Dysrhythmia   . Female bladder prolapse   . GERD (gastroesophageal reflux disease)   . Heart disease   . Hyperlipidemia   . Hypertension    echo and stress 4/10 reports on chart, EKG ` LOV 9/12 on chart  . Mild aortic stenosis    Past Surgical History:  Procedure Laterality Date  . ABDOMINAL HYSTERECTOMY    . ANTERIOR AND POSTERIOR REPAIR  04/26/2011   Procedure: ANTERIOR (CYSTOCELE) AND POSTERIOR REPAIR (RECTOCELE);  Surgeon: Reece Packer, MD;  Location: [facility];  Service: Urology;  Laterality: N/A;  . CATARACT EXTRACTION Bilateral    with IOL  . CHOLECYSTECTOMY  2009  . COLONOSCOPY N/A 12/02/2013   Procedure: COLONOSCOPY;  Surgeon: Danie Binder, MD;  Location: [facility];  Service: Endoscopy;  Laterality: N/A;  9:30 AM - rescheduled to 8:30 - Doris to notify pt  . COLONOSCOPY W/ POLYPECTOMY    . LEFT HEART CATH  09/10/2008   normal coronary arteries, normal LV systolic function, EF 59% (Dr. [name])  . LYMPH NODE DISSECTION Right 1997   under arm  . NM MYOCAR PERF WALL MOTION  2010   dipyridamole - mild-mod in intenstiy perfusion defect in mid anterior, mid anteroseptal wall, EF 70%  . OVARY SURGERY     bilateral tumors removed  . THYROIDECTOMY    . THYROIDECTOMY  02/2008  . TRANSTHORACIC ECHOCARDIOGRAM  08/2011   EF=>55%, mild conc LVH; trace MR; mild TR; mild-mod AV calcification with mild valvular AV stenosis  . VAGINAL PROLAPSE REPAIR  04/26/2011   Procedure: VAGINAL VAULT SUSPENSION;  Surgeon: Reece Packer, MD;  Location: [facility];  Service: Urology;  Laterality: N/A;  with Graft  10x6   Social History   Socioeconomic History  . Marital status: Married    Spouse name: [name]  .  Number of children: 1  . Years of education: Trade  . Highest education level: None  Social Needs  . Financial resource strain: None  . Food insecurity - worry: None  . Food insecurity - inability: None  . Transportation needs - medical: None  . Transportation needs - non-medical: None  Occupational History  . Occupation: Retired  Tobacco Use  . Smoking status: Never Smoker  . Smokeless tobacco: Never Used  Substance and Sexual Activity  . Alcohol use: No    Comment: socially- none x 30 years  . Drug use: No  . Sexual activity: Yes  Other Topics Concern  . None  Social History Narrative   Patient lives at home with spouse.   Caffeine Use: 1 cup of coffee daily   Outpatient Encounter Medications as of 05/26/2017  Medication Sig  . brimonidine (ALPHAGAN P) 0.1 % SOLN at bedtime.  . [DISCONTINUED] Glucose Blood (TRUE METRIX BLOOD GLUCOSE TEST VI) 4 (four) times daily.  . [DISCONTINUED] Travoprost, BAK Free, (TRAVATAN Z) 0.004 % SOLN ophthalmic solution at bedtime.  Marland Kitchen amLODipine (NORVASC) 10 MG tablet TAKE 1 TABLET(10 MG) BY MOUTH EVERY MORNING  . aspirin 81 MG tablet Take 81 mg by mouth every morning.   . B-D ULTRAFINE III SHORT PEN 31G X 8 MM MISC USE AS DIRECTED  WITH INSULIN PENS  . benazepril (LOTENSIN) 40 MG tablet TAKE 1 TABLET(40 MG) BY MOUTH DAILY  . Calcium Carbonate-Vit D-Min (CALCIUM 1200) 1200-1000 MG-UNIT CHEW Chew 1 each by mouth daily.  . ferrous sulfate 325 (65 FE) MG tablet Take 325 mg by mouth daily with breakfast.  . glucose blood (TRUE METRIX BLOOD GLUCOSE TEST) test strip Test BG 4 x daily E11.65  . Insulin Detemir (LEVEMIR FLEXTOUCH) 100 UNIT/ML Pen Inject 60 Units into the skin at bedtime.  . metFORMIN (GLUCOPHAGE) 500 MG tablet TAKE 1 TABLET(500 MG) BY MOUTH DAILY WITH BREAKFAST  . Multiple Vitamins-Minerals (CENTRUM) tablet Take 1 tablet by mouth every morning.   . Nutritional Supplements (NUTRITIONAL SUPPLEMENT PO) Take by mouth. Neurx-TF (formulation for  peripheral nerve health)  . Omega-3 Fatty Acids (FISH OIL) 1200 MG CAPS Take 1 capsule by mouth every morning.   . pravastatin (PRAVACHOL) 80 MG tablet TAKE 1 TABLET(80 MG) BY MOUTH DAILY  . Travoprost, BAK Free, (TRAVATAN) 0.004 % SOLN ophthalmic solution Place 1 drop into both eyes at bedtime.   . triamterene-hydrochlorothiazide (MAXZIDE) 75-50 MG tablet Take 1 tablet daily by mouth.  . [DISCONTINUED] benazepril (LOTENSIN) 40 MG tablet TAKE 1 TABLET(40 MG) BY MOUTH DAILY  . [DISCONTINUED] glucose blood (ONE TOUCH ULTRA TEST) test strip Use as instructed  . [DISCONTINUED] Insulin Detemir (LEVEMIR FLEXTOUCH) 100 UNIT/ML Pen Inject 60 Units into the skin at bedtime.  . [DISCONTINUED] metFORMIN (GLUCOPHAGE) 500 MG tablet Take 1 tablet (500 mg total) daily with breakfast by mouth.  . [DISCONTINUED] ONETOUCH DELICA LANCETS 02V MISC USE TWICE DAILY TO TEST BLOOD SUGAR  . [DISCONTINUED] pravastatin (PRAVACHOL) 80 MG tablet Take 1 tablet (80 mg total) daily by mouth.  . [DISCONTINUED] triamterene-hydrochlorothiazide (MAXZIDE) 75-50 MG tablet TAKE 1 TABLET BY MOUTH DAILY   No facility-administered encounter medications on file as of 05/26/2017.    ALLERGIES: Allergies  Allergen Reactions  . Spironolactone     Stomach problems, vision changes    VACCINATION STATUS: Immunization History  Administered Date(s) Administered  . Pneumococcal Conjugate-13 12/11/2013  . Pneumococcal Polysaccharide-23 01/13/2010  . Tdap 10/05/2010    Diabetes  She presents for her follow-up diabetic visit. She has type 2 diabetes mellitus. Onset time: She was diagnosed at approximate age of 77 years. Her disease course has been improving. There are no hypoglycemic associated symptoms. Pertinent negatives for hypoglycemia include no confusion, headaches, pallor or seizures. Associated symptoms include blurred vision, fatigue, polydipsia and polyuria. Pertinent negatives for diabetes include no chest pain and no polyphagia.  There are no hypoglycemic complications. Symptoms are improving. Diabetic complications include nephropathy and retinopathy. Risk factors for coronary artery disease include dyslipidemia, diabetes mellitus, obesity and sedentary lifestyle. Her weight is stable. She is following a generally unhealthy diet. When asked about meal planning, she reported none. She has had a previous visit with a dietitian. She rarely participates in exercise. Her breakfast blood glucose range is generally 140-180 mg/dl. Her lunch blood glucose range is generally 140-180 mg/dl. Her dinner blood glucose range is generally 140-180 mg/dl. Her bedtime blood glucose range is generally 140-180 mg/dl. Her overall blood glucose range is 140-180 mg/dl. Eye exam is current.  Hyperlipidemia  This is a chronic problem. The current episode started more than 1 year ago. The problem is uncontrolled. Exacerbating diseases include diabetes and obesity. Pertinent negatives include no chest pain, myalgias or shortness of breath. Current antihyperlipidemic treatment includes statins. Risk factors for coronary artery disease include dyslipidemia, diabetes mellitus, hypertension, obesity  and a sedentary lifestyle.  Hypertension  This is a chronic problem. The current episode started more than 1 year ago. Associated symptoms include blurred vision. Pertinent negatives include no chest pain, headaches, palpitations or shortness of breath. Risk factors for coronary artery disease include dyslipidemia, diabetes mellitus, obesity and sedentary lifestyle. Hypertensive end-organ damage includes retinopathy.    Review of Systems  Constitutional: Positive for fatigue. Negative for chills, fever and unexpected weight change.  HENT: Negative for trouble swallowing and voice change.   Eyes: Positive for blurred vision. Negative for visual disturbance.  Respiratory: Negative for cough, shortness of breath and wheezing.   Cardiovascular: Negative for chest pain,  palpitations and leg swelling.  Gastrointestinal: Negative for diarrhea, nausea and vomiting.  Endocrine: Positive for polydipsia and polyuria. Negative for cold intolerance, heat intolerance and polyphagia.  Musculoskeletal: Negative for arthralgias and myalgias.  Skin: Negative for color change, pallor, rash and wound.  Neurological: Negative for seizures and headaches.  Psychiatric/Behavioral: Negative for confusion and suicidal ideas.    Objective:    BP 133/78   Pulse 67   Ht 5\' 5"  (1.651 m)   BMI 30.12 kg/m   Wt Readings from Last 3 Encounters:  05/11/17 181 lb (82.1 kg)  03/23/17 179 lb (81.2 kg)  02/06/17 177 lb (80.3 kg)    Physical Exam  Constitutional: She is oriented to person, place, and time. She appears well-developed.  HENT:  Head: Normocephalic and atraumatic.  Eyes: EOM are normal.  Neck: Normal range of motion. Neck supple. No tracheal deviation present. No thyromegaly present.  Cardiovascular: Normal rate and regular rhythm.  Pulmonary/Chest: Effort normal and breath sounds normal.  Abdominal: Soft. Bowel sounds are normal. There is no tenderness. There is no guarding.  Musculoskeletal: Normal range of motion. She exhibits no edema.  Neurological: She is alert and oriented to person, place, and time. She has normal reflexes. No cranial nerve deficit. Coordination normal.  Skin: Skin is warm and dry. No rash noted. No erythema. No pallor.  Psychiatric: She has a normal mood and affect. Judgment normal.    CMP ( most recent) CMP     Component Value Date/Time   NA 137 05/04/2017 0822   K 4.2 05/04/2017 0822   CL 98 05/04/2017 0822   CO2 29 05/04/2017 0822   GLUCOSE 171 (H) 05/04/2017 0822   BUN 17 05/04/2017 0822   CREATININE 1.13 (H) 05/04/2017 0822   CALCIUM 10.2 05/04/2017 0822   PROT 7.3 05/04/2017 0822   ALBUMIN 4.0 10/07/2016 0723   AST 24 05/04/2017 0822   ALT 22 05/04/2017 0822   ALKPHOS 48 10/07/2016 0723   BILITOT 0.5 05/04/2017 0822    GFRNONAA 48 (L) 05/04/2017 0822   GFRAA 55 (L) 05/04/2017 0822     Diabetic Labs (most recent): Lab Results  Component Value Date   HGBA1C 9.3 (H) 05/04/2017   HGBA1C 9.0 (H) 01/18/2017   HGBA1C 8.7 (H) 10/07/2016    Lipid Panel     Component Value Date/Time   CHOL 171 03/15/2017 0742   TRIG 227 (H) 03/15/2017 0742   HDL 40 (L) 03/15/2017 0742   CHOLHDL 4.3 03/15/2017 0742   VLDL 43 (H) 07/05/2016 0715   LDLCALC 100 (H) 07/05/2016 0715     Assessment & Plan:   1. Type 2 diabetes mellitus with stage 1 chronic kidney disease, with long-term current use of insulin ([employer])  - Patient has currently uncontrolled symptomatic type 2 DM since  77 years of age. -  She did bring her logs, showing  controlled fasting and postprandial glucose profile on adjusted dose of Levemir.  - Her most recent A1c was high at 9.3%. Recent labs reviewed, showing stage.   Her diabetes is complicated by CKD obesity/sedentary life and patient remains at a high risk for more acute and chronic complications of diabetes which include CAD, CVA, CKD, retinopathy, and neuropathy. These are all discussed in detail with the patient.  - I have counseled the patient on diet management and weight loss, by adopting a carbohydrate restricted/protein rich diet.  -  Suggestion is made for her to avoid simple carbohydrates  from her diet including Cakes, Sweet Desserts / Pastries, Ice Cream, Soda (diet and regular), Sweet Tea, Candies, Chips, Cookies, Store Bought Juices, Alcohol in Excess of  1-2 drinks a day, Artificial Sweeteners, and "Sugar-free" Products. This will help patient to have stable blood glucose profile and potentially avoid unintended weight gain.   - I encouraged the patient to switch to  unprocessed or minimally processed complex starch and increased protein intake (animal or plant source), fruits, and vegetables.  - Patient is advised to stick to a routine mealtimes to eat 3 meals  a day and avoid  unnecessary snacks ( to snack only to correct hypoglycemia).   - I have approached patient with the following individualized plan to manage diabetes and patient agrees:   -   based on her presentation with  controlled fasting and postprandial glucose profile, despite her recent higher A1c of 9.3%, she will not need bolus insulin for now. However she will continue to require basal insulin. - I will continue  her basal insulin Levemir  60 units daily at bedtime, associated with strict monitoring of blood glucose 2 times a day-daily before breakfast and at bedtime until her return visit in 3 months.  -Patient is encouraged to call clinic for blood glucose levels less than 70 or above 200 mg /dl. - I will continue metformin 500 mg by mouth twice a day, therapeutically suitable for patient. -Patient is not a candidate for  SGLT2 inhibitors due to CKD.  - Patient will be considered for incretin therapy as appropriate next visit. - Patient specific target  A1c;  LDL, HDL, Triglycerides, and  Waist Circumference were discussed in detail.  2) BP/HTN: Her blood pressure is controlled to target, I advised her to continue her current blood pressure medications, including Benazepril 40 mg by mouth daily.  3) Lipids/HPL:   Uncontrolled, LDL at 97.   Patient is advised tocontinue statins. 4)  Weight/Diet: CDE Consult has been initiated , exercise, and detailed carbohydrates information provided.  5) Chronic Care/Health Maintenance:  -Patient is on ACEI/ARB and Statin medications and encouraged to continue to follow up with Ophthalmology, Podiatrist at least yearly or according to recommendations, and advised to  stay away from smoking. I have recommended yearly flu vaccine and pneumonia vaccination at least every 5 years; moderate intensity exercise for up to 150 minutes weekly; and  sleep for at least 7 hours a day.  - Time spent with the patient: 25 min, of which >50% was spent in reviewing her blood  glucose logs , discussing her hypo- and hyper-glycemic episodes, reviewing her current and  previous labs and insulin doses and developing a plan to avoid hypo- and hyper-glycemia. Please refer to Patient Instructions for Blood Glucose Monitoring and Insulin/Medications Dosing Guide"  in media tab for additional information.  - I advised patient to maintain close follow up with  Fayrene Helper, MD for primary care needs.  Follow up plan: - No Follow-up on file.  Glade Lloyd, MD Phone: [phone]  Fax: [phone]   This note was partially dictated with voice recognition software. Similar sounding words can be transcribed inadequately or may not  be corrected upon review.  05/26/2017, 1:15 PM

## 2017-05-26 NOTE — Patient Instructions (Signed)

## 2017-06-20 ENCOUNTER — Telehealth: Payer: Self-pay | Admitting: "Endocrinology

## 2017-06-20 NOTE — Telephone Encounter (Signed)
These readings are acceptable for now. We will decide next visit, if change is needed.

## 2017-06-20 NOTE — Telephone Encounter (Signed)
Joanna Brown is stating that her blood sugars are running high  Fri 02/01 AM-148 PM-187  Sat 02/02 AM-164 PM-249  Sun 02/03 AM-164 PM-242  Mon 02/04 AM-203 PM-189  Tues 02/05 AM-149  Please advise of any changes?

## 2017-06-20 NOTE — Telephone Encounter (Signed)
Pt.notified

## 2017-07-17 ENCOUNTER — Encounter: Payer: Self-pay | Admitting: Family Medicine

## 2017-07-17 ENCOUNTER — Ambulatory Visit (INDEPENDENT_AMBULATORY_CARE_PROVIDER_SITE_OTHER): Payer: Medicare Other | Admitting: Family Medicine

## 2017-07-17 ENCOUNTER — Ambulatory Visit: Payer: Self-pay

## 2017-07-17 VITALS — BP 134/70 | HR 70 | Resp 16 | Ht 65.0 in | Wt 176.0 lb

## 2017-07-17 DIAGNOSIS — E782 Mixed hyperlipidemia: Secondary | ICD-10-CM

## 2017-07-17 DIAGNOSIS — I10 Essential (primary) hypertension: Secondary | ICD-10-CM

## 2017-07-17 DIAGNOSIS — E663 Overweight: Secondary | ICD-10-CM | POA: Diagnosis not present

## 2017-07-17 DIAGNOSIS — N181 Chronic kidney disease, stage 1: Secondary | ICD-10-CM | POA: Diagnosis not present

## 2017-07-17 DIAGNOSIS — Z794 Long term (current) use of insulin: Secondary | ICD-10-CM

## 2017-07-17 DIAGNOSIS — E1122 Type 2 diabetes mellitus with diabetic chronic kidney disease: Secondary | ICD-10-CM | POA: Diagnosis not present

## 2017-07-17 DIAGNOSIS — Z1231 Encounter for screening mammogram for malignant neoplasm of breast: Secondary | ICD-10-CM

## 2017-07-17 NOTE — Patient Instructions (Signed)
F/u  With MD in 6 month, call if you need  Me before  Please get fasting lipid panel in April the same day you are getting Dr Liliane Channel labs, we will give you the order sheet today  Mammogram to be scheduled  At checkout  [employer] today from office today   It is important that you exercise regularly at least 30 minutes 5 times a week. If you develop chest pain, have severe difficulty breathing, or feel very tired, stop exercising immediately and seek medical attention  Please work on good  health habits so that your health will improve. 1. Commitment to daily physical activity for 30 to 60  minutes, if you are able to do this.  2. Commitment to wise food choices. Aim for half of your  food intake to be vegetable and fruit, one quarter starchy foods, and one quarter protein. Try to eat on a regular schedule  3 meals per day, snacking between meals should be limited to vegetables or fruits or small portions of nuts. 64 ounces of water per day is generally recommended, unless you have specific health conditions, like heart failure or kidney failure where you will need to limit fluid intake.  3. Commitment to sufficient and a  good quality of physical and mental rest daily, generally between 6 to 8 hours per day.  WITH PERSISTANCE AND PERSEVERANCE, THE IMPOSSIBLE , BECOMES THE NORM!  Thank you  for choosing [facility]. We consider it a privelige to serve you.  Delivering excellent health care in a caring and  compassionate way is our goal.  Partnering with you,  so that together we can achieve this goal is our strategy.

## 2017-07-18 ENCOUNTER — Other Ambulatory Visit (HOSPITAL_COMMUNITY)
Admission: RE | Admit: 2017-07-18 | Discharge: 2017-07-18 | Disposition: A | Discharge status: Home / Self Care | Payer: Medicare Other | Source: Other Acute Inpatient Hospital | Attending: Family Medicine | Admitting: Family Medicine

## 2017-07-18 ENCOUNTER — Ambulatory Visit: Payer: Self-pay

## 2017-07-18 DIAGNOSIS — E119 Type 2 diabetes mellitus without complications: Secondary | ICD-10-CM | POA: Diagnosis not present

## 2017-07-18 DIAGNOSIS — N181 Chronic kidney disease, stage 1: Secondary | ICD-10-CM | POA: Diagnosis not present

## 2017-07-18 DIAGNOSIS — Z794 Long term (current) use of insulin: Secondary | ICD-10-CM | POA: Insufficient documentation

## 2017-07-18 DIAGNOSIS — E1122 Type 2 diabetes mellitus with diabetic chronic kidney disease: Secondary | ICD-10-CM | POA: Diagnosis not present

## 2017-07-18 DIAGNOSIS — H401131 Primary open-angle glaucoma, bilateral, mild stage: Secondary | ICD-10-CM | POA: Diagnosis not present

## 2017-07-18 DIAGNOSIS — Z961 Presence of intraocular lens: Secondary | ICD-10-CM | POA: Diagnosis not present

## 2017-07-19 ENCOUNTER — Ambulatory Visit: Payer: Self-pay

## 2017-07-19 ENCOUNTER — Ambulatory Visit (INDEPENDENT_AMBULATORY_CARE_PROVIDER_SITE_OTHER): Payer: Medicare Other

## 2017-07-19 VITALS — BP 134/80 | HR 73 | Resp 16 | Ht 65.0 in | Wt 176.0 lb

## 2017-07-19 DIAGNOSIS — Z0000 Encounter for general adult medical examination without abnormal findings: Secondary | ICD-10-CM

## 2017-07-19 LAB — MICROALBUMIN / CREATININE URINE RATIO
Creatinine, Urine: 32.9 mg/dL
Microalb Creat Ratio: 31.3 mg/g creat — ABNORMAL HIGH (ref 0.0–30.0)
Microalb, Ur: 10.3 ug/mL — ABNORMAL HIGH

## 2017-07-19 NOTE — Patient Instructions (Signed)
Joanna Brown , Thank you for taking time to come for your Medicare Wellness Visit. I appreciate your ongoing commitment to your health goals. Please review the following plan we discussed and let me know if I can assist you in the future.   Screening recommendations/referrals: Colonoscopy: 11/2023 Mammogram: complete Bone Density: complete Recommended yearly ophthalmology/optometry visit for glaucoma screening and checkup Recommended yearly dental visit for hygiene and checkup  Vaccinations: Influenza vaccine: complete Pneumococcal vaccine: complete Tdap vaccine: 10/04/2020 Shingles vaccine: refused   Advanced directives: info given   Conditions/risks identified: yes  Next appointment: scheduled   Preventive Care 29 Years and Older, Female Preventive care refers to lifestyle choices and visits with your health care provider that can promote health and wellness. What does preventive care include?  A yearly physical exam. This is also called an annual well check.  Dental exams once or twice a year.  Routine eye exams. Ask your health care provider how often you should have your eyes checked.  Personal lifestyle choices, including:  Daily care of your teeth and gums.  Regular physical activity.  Eating a healthy diet.  Avoiding tobacco and drug use.  Limiting alcohol use.  Practicing safe sex.  Taking low-dose aspirin every day.  Taking vitamin and mineral supplements as recommended by your health care provider. What happens during an annual well check? The services and screenings done by your health care provider during your annual well check will depend on your age, overall health, lifestyle risk factors, and family history of disease. Counseling  Your health care provider may ask you questions about your:  Alcohol use.  Tobacco use.  Drug use.  Emotional well-being.  Home and relationship well-being.  Sexual activity.  Eating habits.  History of  falls.  Memory and ability to understand (cognition).  Work and work Statistician.  Reproductive health. Screening  You may have the following tests or measurements:  Height, weight, and BMI.  Blood pressure.  Lipid and cholesterol levels. These may be checked every 5 years, or more frequently if you are over 87 years old.  Skin check.  Lung cancer screening. You may have this screening every year starting at age 34 if you have a 30-pack-year history of smoking and currently smoke or have quit within the past 15 years.  Fecal occult blood test (FOBT) of the stool. You may have this test every year starting at age 26.  Flexible sigmoidoscopy or colonoscopy. You may have a sigmoidoscopy every 5 years or a colonoscopy every 10 years starting at age 76.  Hepatitis C blood test.  Hepatitis B blood test.  Sexually transmitted disease (STD) testing.  Diabetes screening. This is done by checking your blood sugar (glucose) after you have not eaten for a while (fasting). You may have this done every 1-3 years.  Bone density scan. This is done to screen for osteoporosis. You may have this done starting at age 64.  Mammogram. This may be done every 1-2 years. Talk to your health care provider about how often you should have regular mammograms. Talk with your health care provider about your test results, treatment options, and if necessary, the need for more tests. Vaccines  Your health care provider may recommend certain vaccines, such as:  Influenza vaccine. This is recommended every year.  Tetanus, diphtheria, and acellular pertussis (Tdap, Td) vaccine. You may need a Td booster every 10 years.  Zoster vaccine. You may need this after age 57.  Pneumococcal 13-valent conjugate (PCV13) vaccine.  One dose is recommended after age 24.  Pneumococcal polysaccharide (PPSV23) vaccine. One dose is recommended after age 61. Talk to your health care provider about which screenings and  vaccines you need and how often you need them. This information is not intended to replace advice given to you by your health care provider. Make sure you discuss any questions you have with your health care provider. Document Released: 05/29/2015 Document Revised: 01/20/2016 Document Reviewed: 03/03/2015 Elsevier Interactive Patient Education  2017 [place] Prevention in the Home Falls can cause injuries. They can happen to people of all ages. There are many things you can do to make your home safe and to help prevent falls. What can I do on the outside of my home?  Regularly fix the edges of walkways and driveways and fix any cracks.  Remove anything that might make you trip as you walk through a door, such as a raised step or threshold.  Trim any bushes or trees on the path to your home.  Use bright outdoor lighting.  Clear any walking paths of anything that might make someone trip, such as rocks or tools.  Regularly check to see if handrails are loose or broken. Make sure that both sides of any steps have handrails.  Any raised decks and porches should have guardrails on the edges.  Have any leaves, snow, or ice cleared regularly.  Use sand or salt on walking paths during winter.  Clean up any spills in your garage right away. This includes oil or grease spills. What can I do in the bathroom?  Use night lights.  Install grab bars by the toilet and in the tub and shower. Do not use towel bars as grab bars.  Use non-skid mats or decals in the tub or shower.  If you need to sit down in the shower, use a plastic, non-slip stool.  Keep the floor dry. Clean up any water that spills on the floor as soon as it happens.  Remove soap buildup in the tub or shower regularly.  Attach bath mats securely with double-sided non-slip rug tape.  Do not have throw rugs and other things on the floor that can make you trip. What can I do in the bedroom?  Use night  lights.  Make sure that you have a light by your bed that is easy to reach.  Do not use any sheets or blankets that are too big for your bed. They should not hang down onto the floor.  Have a firm chair that has side arms. You can use this for support while you get dressed.  Do not have throw rugs and other things on the floor that can make you trip. What can I do in the kitchen?  Clean up any spills right away.  Avoid walking on wet floors.  Keep items that you use a lot in easy-to-reach places.  If you need to reach something above you, use a strong step stool that has a grab bar.  Keep electrical cords out of the way.  Do not use floor polish or wax that makes floors slippery. If you must use wax, use non-skid floor wax.  Do not have throw rugs and other things on the floor that can make you trip. What can I do with my stairs?  Do not leave any items on the stairs.  Make sure that there are handrails on both sides of the stairs and use them. Fix handrails that are broken  or loose. Make sure that handrails are as long as the stairways.  Check any carpeting to make sure that it is firmly attached to the stairs. Fix any carpet that is loose or worn.  Avoid having throw rugs at the top or bottom of the stairs. If you do have throw rugs, attach them to the floor with carpet tape.  Make sure that you have a light switch at the top of the stairs and the bottom of the stairs. If you do not have them, ask someone to add them for you. What else can I do to help prevent falls?  Wear shoes that:  Do not have high heels.  Have rubber bottoms.  Are comfortable and fit you well.  Are closed at the toe. Do not wear sandals.  If you use a stepladder:  Make sure that it is fully opened. Do not climb a closed stepladder.  Make sure that both sides of the stepladder are locked into place.  Ask someone to hold it for you, if possible.  Clearly mark and make sure that you can  see:  Any grab bars or handrails.  First and last steps.  Where the edge of each step is.  Use tools that help you move around (mobility aids) if they are needed. These include:  Canes.  Walkers.  Scooters.  Crutches.  Turn on the lights when you go into a dark area. Replace any light bulbs as soon as they burn out.  Set up your furniture so you have a clear path. Avoid moving your furniture around.  If any of your floors are uneven, fix them.  If there are any pets around you, be aware of where they are.  Review your medicines with your doctor. Some medicines can make you feel dizzy. This can increase your chance of falling. Ask your doctor what other things that you can do to help prevent falls. This information is not intended to replace advice given to you by your health care provider. Make sure you discuss any questions you have with your health care provider. Document Released: 02/26/2009 Document Revised: 10/08/2015 Document Reviewed: 06/06/2014 Elsevier Interactive Patient Education  2017 [employer].

## 2017-07-19 NOTE — Progress Notes (Signed)
Subjective:   Joanna Brown is a 77 y.o. female who presents for Medicare Annual (Subsequent) preventive examination.  Review of Systems:         Objective:     Vitals: BP 134/80   Pulse 73   Resp 16   Ht 5\' 5"  (1.651 m)   Wt 176 lb (79.8 kg)   SpO2 97%   BMI 29.29 kg/m   Body mass index is 29.29 kg/m.  Advanced Directives 07/19/2017 07/19/2016 02/17/2016 07/22/2015 07/11/2014 12/02/2013 05/01/2011  Does Patient Have a Medical Advance Directive? No No Yes No No;Yes Patient does not have advance directive;Patient would not like information Patient does not have advance directive  Type of Advance Directive - - - - Living will - -  Copy of [place] in Chart? - - No - copy requested - No - copy requested - -  Would patient like information on creating a medical advance directive? Yes (ED - Information included in AVS) Yes (MAU/Ambulatory/Procedural Areas - Information given) - No - patient declined information - - -  Pre-existing out of facility DNR order (yellow form or pink MOST form) - - - - - No -    Tobacco Social History   Tobacco Use  Smoking Status Never Smoker  Smokeless Tobacco Never Used     Counseling given: Not Answered   Clinical Intake:  Pre-visit preparation completed: Yes  Pain : 0-10 Pain Location: Knee Pain Orientation: Left Pain Descriptors / Indicators: Aching     Nutritional Status: BMI 25 -29 Overweight Diabetes: Yes CBG done?: No Did pt. bring in CBG monitor from home?: No  How often do you need to have someone help you when you read instructions, pamphlets, or other written materials from your doctor or pharmacy?: 1 - Never What is the last grade level you completed in school?: 12th  Interpreter Needed?: No  Information entered by :: Maleiya Pergola   Past Medical History:  Diagnosis Date  . Blood transfusion    1980  . Diabetes mellitus   . Dysrhythmia   . Female bladder prolapse   . GERD (gastroesophageal reflux  disease)   . Heart disease   . Hyperlipidemia   . Hypertension    echo and stress 4/10 reports on chart, EKG ` LOV 9/12 on chart  . Mild aortic stenosis    Past Surgical History:  Procedure Laterality Date  . ABDOMINAL HYSTERECTOMY    . ANTERIOR AND POSTERIOR REPAIR  04/26/2011   Procedure: ANTERIOR (CYSTOCELE) AND POSTERIOR REPAIR (RECTOCELE);  Surgeon: Reece Packer, MD;  Location: [facility];  Service: Urology;  Laterality: N/A;  . CATARACT EXTRACTION Bilateral    with IOL  . CHOLECYSTECTOMY  2009  . COLONOSCOPY N/A 12/02/2013   Procedure: COLONOSCOPY;  Surgeon: Danie Binder, MD;  Location: [facility];  Service: Endoscopy;  Laterality: N/A;  9:30 AM - rescheduled to 8:30 - Doris to notify pt  . COLONOSCOPY W/ POLYPECTOMY    . LEFT HEART CATH  09/10/2008   normal coronary arteries, normal LV systolic function, EF 27% (Dr. [name])  . LYMPH NODE DISSECTION Right 1997   under arm  . NM MYOCAR PERF WALL MOTION  2010   dipyridamole - mild-mod in intenstiy perfusion defect in mid anterior, mid anteroseptal wall, EF 70%  . OVARY SURGERY     bilateral tumors removed  . THYROIDECTOMY    . THYROIDECTOMY  02/2008  . TRANSTHORACIC ECHOCARDIOGRAM  08/2011  EF=>55%, mild conc LVH; trace MR; mild TR; mild-mod AV calcification with mild valvular AV stenosis  . VAGINAL PROLAPSE REPAIR  04/26/2011   Procedure: VAGINAL VAULT SUSPENSION;  Surgeon: Reece Packer, MD;  Location: [facility];  Service: Urology;  Laterality: N/A;  with Graft  10x6   Family History  Problem Relation Age of Onset  . Stomach cancer Mother 64  . Heart disease Mother 14       heart disease  . Heart disease Father 62       MI  . Stroke Maternal Grandfather   . Heart attack Paternal Grandfather   . Hypertension Brother   . Bone cancer Brother   . Hypertension Sister   . Liver cancer Sister   . Hypertension Sister   . Hypertension Child    Social History   Socioeconomic History  . Marital status:  Married    Spouse name: [name]  . Number of children: 1  . Years of education: Trade  . Highest education level: None  Social Needs  . Financial resource strain: Not very hard  . Food insecurity - worry: Never true  . Food insecurity - inability: None  . Transportation needs - medical: No  . Transportation needs - non-medical: No  Occupational History  . Occupation: Retired  Tobacco Use  . Smoking status: Never Smoker  . Smokeless tobacco: Never Used  Substance and Sexual Activity  . Alcohol use: No    Comment: socially- none x 30 years  . Drug use: No  . Sexual activity: Yes  Other Topics Concern  . None  Social History Narrative   Patient lives at home with spouse.   Caffeine Use: 1 cup of coffee daily    Outpatient Encounter Medications as of 07/19/2017  Medication Sig  . amLODipine (NORVASC) 10 MG tablet TAKE 1 TABLET(10 MG) BY MOUTH EVERY MORNING  . aspirin 81 MG tablet Take 81 mg by mouth every morning.   . B-D ULTRAFINE III SHORT PEN 31G X 8 MM MISC USE AS DIRECTED WITH INSULIN PENS  . benazepril (LOTENSIN) 40 MG tablet TAKE 1 TABLET(40 MG) BY MOUTH DAILY  . brimonidine (ALPHAGAN P) 0.1 % SOLN at bedtime.  . Calcium Carbonate-Vit D-Min (CALCIUM 1200) 1200-1000 MG-UNIT CHEW Chew 1 each by mouth daily.  . ferrous sulfate 325 (65 FE) MG tablet Take 325 mg by mouth daily with breakfast.  . glucose blood (TRUE METRIX BLOOD GLUCOSE TEST) test strip Test BG 4 x daily E11.65  . Insulin Detemir (LEVEMIR FLEXTOUCH) 100 UNIT/ML Pen Inject 60 Units into the skin at bedtime.  . metFORMIN (GLUCOPHAGE) 500 MG tablet TAKE 1 TABLET(500 MG) BY MOUTH DAILY WITH BREAKFAST  . Multiple Vitamins-Minerals (CENTRUM) tablet Take 1 tablet by mouth every morning.   . Nutritional Supplements (NUTRITIONAL SUPPLEMENT PO) Take by mouth. Neurx-TF (formulation for peripheral nerve health)  . Omega-3 Fatty Acids (FISH OIL) 1200 MG CAPS Take 1 capsule by mouth every morning.   . pravastatin (PRAVACHOL)  80 MG tablet TAKE 1 TABLET(80 MG) BY MOUTH DAILY  . Travoprost, BAK Free, (TRAVATAN) 0.004 % SOLN ophthalmic solution Place 1 drop into both eyes at bedtime.   . triamterene-hydrochlorothiazide (MAXZIDE) 75-50 MG tablet Take 1 tablet daily by mouth.   No facility-administered encounter medications on file as of 07/19/2017.     Activities of Daily Living In your present state of health, do you have any difficulty performing the following activities: 07/19/2016  Hearing? N  Vision? N  Difficulty  concentrating or making decisions? N  Walking or climbing stairs? N  Dressing or bathing? N  Doing errands, shopping? N  Preparing Food and eating ? N  Using the Toilet? N  In the past six months, have you accidently leaked urine? N  Do you have problems with loss of bowel control? N  Managing your Medications? N  Managing your Finances? N  Housekeeping or managing your Housekeeping? N  Some recent data might be hidden    Patient Care Team: Fayrene Helper, MD as PCP - [facility], Nadean Corwin, MD as Attending Physician (Cardiology) Rutherford Guys, MD as Attending Physician (Ophthalmology) Arlana Lindau, RD as Dietitian (Nutrition)    Assessment:   This is a routine wellness examination for [place].  Exercise Activities and Dietary recommendations    Goals    . HEMOGLOBIN A1C < 7.0     A1C 8.7 07/05/2016   Will continue see nutritionist  Will work with pcp to manage medications        Fall Risk Fall Risk  07/19/2017 05/11/2017 03/23/2017 10/17/2016 09/05/2016  Falls in the past year? No No No No No   Is the patient's home free of loose throw rugs in walkways, pet beds, electrical cords, etc?   yes      Grab bars in the bathroom? no      Handrails on the stairs?   yes      Adequate lighting?   yes  Timed Get Up and Go performed:   Depression Screen PHQ 2/9 Scores 07/19/2017 05/11/2017 10/17/2016 09/05/2016  PHQ - 2 Score 0 0 0 0  PHQ- 9 Score - - - -     Cognitive  Function MMSE - Mini Mental State Exam 07/23/2014  Orientation to time 5  Orientation to Place 5  Registration 3  Attention/ Calculation 4  Recall 1  Language- name 2 objects 2  Language- repeat 1  Language- follow 3 step command 3  Language- read & follow direction 1  Write a sentence 1  Copy design 1  Total score 27     6CIT Screen 07/19/2017  What Year? 0 points  What time? 0 points  Months in reverse 0 points  Repeat phrase 0 points    Immunization History  Administered Date(s) Administered  . Pneumococcal Conjugate-13 12/11/2013  . Pneumococcal Polysaccharide-23 01/13/2010  . Tdap 10/05/2010    Qualifies for Shingles Vaccine?  Screening Tests Health Maintenance  Topic Date Due  . INFLUENZA VACCINE  08/16/2017 (Originally 12/14/2016)  . OPHTHALMOLOGY EXAM  09/21/2017  . HEMOGLOBIN A1C  11/02/2017  . FOOT EXAM  03/24/2018  . TETANUS/TDAP  10/04/2020  . COLONOSCOPY  12/04/2023  . DEXA SCAN  Completed  . PNA vac Low Risk Adult  Completed    Cancer Screenings: Lung: Low Dose CT Chest recommended if Age 20-80 years, 30 pack-year currently smoking OR have quit w/in 15years. Patient does not qualify. Breast:  Up to date on Mammogram? Yes   Up to date of Bone Density/Dexa? Yes Colorectal: due 11/2023  Additional Screenings: Hepatitis B/HIV/Syphillis: Hepatitis C Screening:      Plan:      I have personally reviewed and noted the following in the patient's chart:   . Medical and social history . Use of alcohol, tobacco or illicit drugs  . Current medications and supplements . Functional ability and status . Nutritional status . Physical activity . Advanced directives . List of other physicians . Hospitalizations, surgeries, and ER visits  in previous 12 months . Vitals . Screenings to include cognitive, depression, and falls . Referrals and appointments  In addition, I have reviewed and discussed with patient certain preventive protocols, quality metrics,  and best practice recommendations. A written personalized care plan for preventive services as well as general preventive health recommendations were provided to patient.     Kate Sable, LPN, LPN  01/20/8477

## 2017-07-20 ENCOUNTER — Encounter: Payer: Self-pay | Admitting: Family Medicine

## 2017-07-20 NOTE — Assessment & Plan Note (Signed)
Joanna Brown is reminded of the importance of commitment to daily physical activity for 30 minutes or more, as able and the need to limit carbohydrate intake to 30 to 60 grams per meal to help with blood sugar control.   The need to take medication as prescribed, test blood sugar as directed, and to call between visits if there is a concern that blood sugar is uncontrolled is also discussed.   Joanna Brown is reminded of the importance of daily foot exam, annual eye examination, and good blood sugar, blood pressure and cholesterol control. Uncontrolled, needs to be more diligent with following diet, managed by endo  Diabetic Labs Latest Ref Rng & Units 07/18/2017 05/04/2017 03/15/2017 01/18/2017 10/07/2016  HbA1c <5.7 % of total Hgb - 9.3(H) - 9.0(H) 8.7(H)  Microalbumin Not Estab. ug/mL 10.3(H) - - - -  Micro/Creat Ratio 0.0 - 30.0 mg/g creat 31.3(H) - - - -  Chol <200 mg/dL - - 171 - -  HDL >50 mg/dL - - 40(L) - -  Calc LDL <100 mg/dL - - - - -  Triglycerides <150 mg/dL - - 227(H) - -  Creatinine 0.60 - 0.93 mg/dL - 1.13(H) 1.14(H) 1.08(H) 1.05(H)   BP/Weight 07/19/2017 07/17/2017 05/26/2017 05/11/2017 03/23/2017 3/97/6734 05/24/3788  Systolic BP 240 973 532 992 426 834 196  Diastolic BP 80 70 78 78 78 78 74  Wt. (Lbs) 176 176 - 181 179 177 180  BMI 29.29 29.29 30.12 30.12 29.79 29.45 29.95   Foot/eye exam completion dates Latest Ref Rng & Units 03/23/2017 09/21/2016  Eye Exam No Retinopathy - No Retinopathy  Foot Form Completion - Done -

## 2017-07-20 NOTE — Assessment & Plan Note (Addendum)
Unchnaged Patient re-educated about  the importance of commitment to a  minimum of 150 minutes of exercise per week.  The importance of healthy food choices with portion control discussed. Encouraged to start a food diary, count calories and to consider  joining a support group. Sample diet sheets offered. Goals set by the patient for the next several months.   Weight /BMI 07/19/2017 07/17/2017 05/26/2017  WEIGHT 176 lb 176 lb -  HEIGHT 5\' 5"  5\' 5"  5\' 5"   BMI 29.29 kg/m2 29.29 kg/m2 30.12 kg/m2

## 2017-07-20 NOTE — Assessment & Plan Note (Signed)
Hyperlipidemia:Low fat diet discussed and encouraged.   Lipid Panel  Lab Results  Component Value Date   CHOL 171 03/15/2017   HDL 40 (L) 03/15/2017   LDLCALC 100 (H) 07/05/2016   TRIG 227 (H) 03/15/2017   CHOLHDL 4.3 03/15/2017  Not at goal, Updated lab needed at/ before next visit.

## 2017-07-20 NOTE — Progress Notes (Signed)
Joanna Brown     MRN: [id number]      DOB: 1940-11-25   HPI [name] is here for follow up and re-evaluation of chronic medical conditions, medication management and review of any available recent lab and radiology data.  Preventive health is updated, specifically  Cancer screening and Immunization.   Questions or concerns regarding consultations or procedures which the PT has had in the interim are  addressed. The PT denies any adverse reactions to current medications since the last visit.  There are no new concerns.  There are no specific complaints  Denies polyuria, polydipsia, blurred vision , or hypoglycemic episodes.   ROS Denies recent fever or chills. Denies sinus pressure, nasal congestion, ear pain or sore throat. Denies chest congestion, productive cough or wheezing. Denies chest pains, palpitations and leg swelling Denies abdominal pain, nausea, vomiting,diarrhea or constipation.   Denies dysuria, frequency, hesitancy or incontinence. Denies joint pain, swelling and limitation in mobility. Denies headaches, seizures, numbness, or tingling. Denies depression, anxiety or insomnia. Denies skin break down or rash.   PE  BP 134/70   Pulse 70   Resp 16   Ht 5\' 5"  (1.651 m)   Wt 176 lb (79.8 kg)   SpO2 99%   BMI 29.29 kg/m   Patient alert and oriented and in no cardiopulmonary distress.  HEENT: No facial asymmetry, EOMI,   oropharynx pink and moist.  Neck supple no JVD, no mass.  Chest: Clear to auscultation bilaterally.  CVS: S1, S2 systolic  murmur, no S3.Regular rate.  ABD: Soft non tender.   Ext: No edema  MS: Adequate ROM spine, shoulders, hips and knees.  Skin: Intact, no ulcerations or rash noted.  Psych: Good eye contact, normal affect. Memory intact not anxious or depressed appearing.  CNS: CN 2-12 intact, power,  normal throughout.no focal deficits noted.   Assessment & Plan  Essential hypertension Controlled, no change in  medication DASH diet and commitment to daily physical activity for a minimum of 30 minutes discussed and encouraged, as a part of hypertension management. The importance of attaining a healthy weight is also discussed.  BP/Weight 07/19/2017 07/17/2017 05/26/2017 05/11/2017 03/23/2017 7/67/3419 07/21/9022  Systolic BP 097 353 299 242 683 419 622  Diastolic BP 80 70 78 78 78 78 74  Wt. (Lbs) 176 176 - 181 179 177 180  BMI 29.29 29.29 30.12 30.12 29.79 29.45 29.95       Mixed hyperlipidemia Hyperlipidemia:Low fat diet discussed and encouraged.   Lipid Panel  Lab Results  Component Value Date   CHOL 171 03/15/2017   HDL 40 (L) 03/15/2017   LDLCALC 100 (H) 07/05/2016   TRIG 227 (H) 03/15/2017   CHOLHDL 4.3 03/15/2017  Not at goal, Updated lab needed at/ before next visit.      Type 2 diabetes mellitus with stage 1 chronic kidney disease, with long-term current use of insulin ([place]) [name] is reminded of the importance of commitment to daily physical activity for 30 minutes or more, as able and the need to limit carbohydrate intake to 30 to 60 grams per meal to help with blood sugar control.   The need to take medication as prescribed, test blood sugar as directed, and to call between visits if there is a concern that blood sugar is uncontrolled is also discussed.   [name] is reminded of the importance of daily foot exam, annual eye examination, and good blood sugar, blood pressure and cholesterol control. Uncontrolled, needs to  be more diligent with following diet, managed by endo  Diabetic Labs Latest Ref Rng & Units 07/18/2017 05/04/2017 03/15/2017 01/18/2017 10/07/2016  HbA1c <5.7 % of total Hgb - 9.3(H) - 9.0(H) 8.7(H)  Microalbumin Not Estab. ug/mL 10.3(H) - - - -  Micro/Creat Ratio 0.0 - 30.0 mg/g creat 31.3(H) - - - -  Chol <200 mg/dL - - 171 - -  HDL >50 mg/dL - - 40(L) - -  Calc LDL <100 mg/dL - - - - -  Triglycerides <150 mg/dL - - 227(H) - -  Creatinine 0.60 - 0.93  mg/dL - 1.13(H) 1.14(H) 1.08(H) 1.05(H)   BP/Weight 07/19/2017 07/17/2017 05/26/2017 05/11/2017 03/23/2017 12/08/2033 09/22/7414  Systolic BP 384 536 468 032 122 482 500  Diastolic BP 80 70 78 78 78 78 74  Wt. (Lbs) 176 176 - 181 179 177 180  BMI 29.29 29.29 30.12 30.12 29.79 29.45 29.95   Foot/eye exam completion dates Latest Ref Rng & Units 03/23/2017 09/21/2016  Eye Exam No Retinopathy - No Retinopathy  Foot Form Completion - Done -        Overweight (BMI 25.0-29.9) Unchnaged Patient re-educated about  the importance of commitment to a  minimum of 150 minutes of exercise per week.  The importance of healthy food choices with portion control discussed. Encouraged to start a food diary, count calories and to consider  joining a support group. Sample diet sheets offered. Goals set by the patient for the next several months.   Weight /BMI 07/19/2017 07/17/2017 05/26/2017  WEIGHT 176 lb 176 lb -  HEIGHT 5\' 5"  5\' 5"  5\' 5"   BMI 29.29 kg/m2 29.29 kg/m2 30.12 kg/m2

## 2017-07-20 NOTE — Assessment & Plan Note (Signed)
Controlled, no change in medication DASH diet and commitment to daily physical activity for a minimum of 30 minutes discussed and encouraged, as a part of hypertension management. The importance of attaining a healthy weight is also discussed.  BP/Weight 07/19/2017 07/17/2017 05/26/2017 05/11/2017 03/23/2017 0/86/5784 10/22/6293  Systolic BP 284 132 440 102 725 366 440  Diastolic BP 80 70 78 78 78 78 74  Wt. (Lbs) 176 176 - 181 179 177 180  BMI 29.29 29.29 30.12 30.12 29.79 29.45 29.95

## 2017-07-26 DIAGNOSIS — L84 Corns and callosities: Secondary | ICD-10-CM | POA: Diagnosis not present

## 2017-07-26 DIAGNOSIS — E1351 Other specified diabetes mellitus with diabetic peripheral angiopathy without gangrene: Secondary | ICD-10-CM | POA: Diagnosis not present

## 2017-07-26 DIAGNOSIS — L602 Onychogryphosis: Secondary | ICD-10-CM | POA: Diagnosis not present

## 2017-08-17 DIAGNOSIS — E1122 Type 2 diabetes mellitus with diabetic chronic kidney disease: Secondary | ICD-10-CM | POA: Diagnosis not present

## 2017-08-17 DIAGNOSIS — N181 Chronic kidney disease, stage 1: Secondary | ICD-10-CM | POA: Diagnosis not present

## 2017-08-17 DIAGNOSIS — Z794 Long term (current) use of insulin: Secondary | ICD-10-CM | POA: Diagnosis not present

## 2017-08-18 LAB — COMPLETE METABOLIC PANEL WITH GFR
AG Ratio: 1.3 (calc) (ref 1.0–2.5)
ALT: 19 U/L (ref 6–29)
AST: 21 U/L (ref 10–35)
Albumin: 4 g/dL (ref 3.6–5.1)
Alkaline phosphatase (APISO): 51 U/L (ref 33–130)
BUN/Creatinine Ratio: 11 (calc) (ref 6–22)
BUN: 11 mg/dL (ref 7–25)
CO2: 33 mmol/L — ABNORMAL HIGH (ref 20–32)
Calcium: 9.8 mg/dL (ref 8.6–10.4)
Chloride: 100 mmol/L (ref 98–110)
Creat: 1.02 mg/dL — ABNORMAL HIGH (ref 0.60–0.93)
GFR, Est African American: 62 mL/min/{1.73_m2} (ref >=60)
GFR, Est Non African American: 54 mL/min/{1.73_m2} — ABNORMAL LOW (ref >=60)
Globulin: 3.1 g/dL (calc) (ref 1.9–3.7)
Glucose, Bld: 151 mg/dL — ABNORMAL HIGH (ref 65–99)
Potassium: 4.4 mmol/L (ref 3.5–5.3)
Sodium: 138 mmol/L (ref 135–146)
Total Bilirubin: 0.4 mg/dL (ref 0.2–1.2)
Total Protein: 7.1 g/dL (ref 6.1–8.1)

## 2017-08-18 LAB — HEMOGLOBIN A1C
Hgb A1c MFr Bld: 8.1 % of total Hgb — ABNORMAL HIGH (ref <5.7)
Mean Plasma Glucose: 186 (calc)
eAG (mmol/L): 10.3 (calc)

## 2017-08-25 ENCOUNTER — Encounter: Payer: Self-pay | Admitting: "Endocrinology

## 2017-08-25 ENCOUNTER — Ambulatory Visit (INDEPENDENT_AMBULATORY_CARE_PROVIDER_SITE_OTHER): Payer: Medicare Other | Admitting: "Endocrinology

## 2017-08-25 VITALS — BP 131/74 | HR 67 | Ht 65.0 in | Wt 179.0 lb

## 2017-08-25 DIAGNOSIS — Z794 Long term (current) use of insulin: Secondary | ICD-10-CM | POA: Diagnosis not present

## 2017-08-25 DIAGNOSIS — E1122 Type 2 diabetes mellitus with diabetic chronic kidney disease: Secondary | ICD-10-CM | POA: Diagnosis not present

## 2017-08-25 DIAGNOSIS — I10 Essential (primary) hypertension: Secondary | ICD-10-CM | POA: Diagnosis not present

## 2017-08-25 DIAGNOSIS — E782 Mixed hyperlipidemia: Secondary | ICD-10-CM | POA: Diagnosis not present

## 2017-08-25 DIAGNOSIS — N181 Chronic kidney disease, stage 1: Secondary | ICD-10-CM | POA: Diagnosis not present

## 2017-08-25 NOTE — Patient Instructions (Signed)

## 2017-08-25 NOTE — Progress Notes (Signed)
Subjective:    Patient ID: Joanna Brown, female    DOB: 1940-07-11. Patient is being seen  For follow-up for management of diabetes requested by  Fayrene Helper, MD  Past Medical History:  Diagnosis Date  . Blood transfusion    1980  . Diabetes mellitus   . Dysrhythmia   . Female bladder prolapse   . GERD (gastroesophageal reflux disease)   . Heart disease   . Hyperlipidemia   . Hypertension    echo and stress 4/10 reports on chart, EKG ` LOV 9/12 on chart  . Mild aortic stenosis    Past Surgical History:  Procedure Laterality Date  . ABDOMINAL HYSTERECTOMY    . ANTERIOR AND POSTERIOR REPAIR  04/26/2011   Procedure: ANTERIOR (CYSTOCELE) AND POSTERIOR REPAIR (RECTOCELE);  Surgeon: Reece Packer, MD;  Location: [facility];  Service: Urology;  Laterality: N/A;  . CATARACT EXTRACTION Bilateral    with IOL  . CHOLECYSTECTOMY  2009  . COLONOSCOPY N/A 12/02/2013   Procedure: COLONOSCOPY;  Surgeon: Danie Binder, MD;  Location: [facility];  Service: Endoscopy;  Laterality: N/A;  9:30 AM - rescheduled to 8:30 - Doris to notify pt  . COLONOSCOPY W/ POLYPECTOMY    . LEFT HEART CATH  09/10/2008   normal coronary arteries, normal LV systolic function, EF 02% (Dr. [name])  . LYMPH NODE DISSECTION Right 1997   under arm  . NM MYOCAR PERF WALL MOTION  2010   dipyridamole - mild-mod in intenstiy perfusion defect in mid anterior, mid anteroseptal wall, EF 70%  . OVARY SURGERY     bilateral tumors removed  . THYROIDECTOMY    . THYROIDECTOMY  02/2008  . TRANSTHORACIC ECHOCARDIOGRAM  08/2011   EF=>55%, mild conc LVH; trace MR; mild TR; mild-mod AV calcification with mild valvular AV stenosis  . VAGINAL PROLAPSE REPAIR  04/26/2011   Procedure: VAGINAL VAULT SUSPENSION;  Surgeon: Reece Packer, MD;  Location: [facility];  Service: Urology;  Laterality: N/A;  with Graft  10x6   Social History   Socioeconomic History  . Marital status: Married    Spouse name: [name]  .  Number of children: 1  . Years of education: Trade  . Highest education level: Not on file  Occupational History  . Occupation: Retired  Scientific laboratory technician  . Financial resource strain: Not very hard  . Food insecurity:    Worry: Never true    Inability: Not on file  . Transportation needs:    Medical: No    Non-medical: No  Tobacco Use  . Smoking status: Never Smoker  . Smokeless tobacco: Never Used  Substance and Sexual Activity  . Alcohol use: No    Comment: socially- none x 30 years  . Drug use: No  . Sexual activity: Yes  Lifestyle  . Physical activity:    Days per week: 5 days    Minutes per session: 40 min  . Stress: Not on file  Relationships  . Social connections:    Talks on phone: Not on file    Gets together: Not on file    Attends religious service: Not on file    Active member of club or organization: Not on file    Attends meetings of clubs or organizations: Not on file    Relationship status: Not on file  Other Topics Concern  . Not on file  Social History Narrative   Patient lives at home with spouse.   Caffeine  Use: 1 cup of coffee daily   Outpatient Encounter Medications as of 08/25/2017  Medication Sig  . amLODipine (NORVASC) 10 MG tablet TAKE 1 TABLET(10 MG) BY MOUTH EVERY MORNING  . aspirin 81 MG tablet Take 81 mg by mouth every morning.   . B-D ULTRAFINE III SHORT PEN 31G X 8 MM MISC USE AS DIRECTED WITH INSULIN PENS  . benazepril (LOTENSIN) 40 MG tablet TAKE 1 TABLET(40 MG) BY MOUTH DAILY  . brimonidine (ALPHAGAN P) 0.1 % SOLN at bedtime.  . Calcium Carbonate-Vit D-Min (CALCIUM 1200) 1200-1000 MG-UNIT CHEW Chew 1 each by mouth daily.  . ferrous sulfate 325 (65 FE) MG tablet Take 325 mg by mouth daily with breakfast.  . glucose blood (TRUE METRIX BLOOD GLUCOSE TEST) test strip Test BG 4 x daily E11.65  . Insulin Detemir (LEVEMIR FLEXTOUCH) 100 UNIT/ML Pen Inject 60 Units into the skin at bedtime.  . metFORMIN (GLUCOPHAGE) 500 MG tablet TAKE 1  TABLET(500 MG) BY MOUTH DAILY WITH BREAKFAST  . Multiple Vitamins-Minerals (CENTRUM) tablet Take 1 tablet by mouth every morning.   . Nutritional Supplements (NUTRITIONAL SUPPLEMENT PO) Take by mouth. Neurx-TF (formulation for peripheral nerve health)  . Omega-3 Fatty Acids (FISH OIL) 1200 MG CAPS Take 1 capsule by mouth every morning.   . pravastatin (PRAVACHOL) 80 MG tablet TAKE 1 TABLET(80 MG) BY MOUTH DAILY  . Travoprost, BAK Free, (TRAVATAN) 0.004 % SOLN ophthalmic solution Place 1 drop into both eyes at bedtime.   . triamterene-hydrochlorothiazide (MAXZIDE) 75-50 MG tablet Take 1 tablet daily by mouth.   No facility-administered encounter medications on file as of 08/25/2017.    ALLERGIES: Allergies  Allergen Reactions  . Spironolactone     Stomach problems, vision changes    VACCINATION STATUS: Immunization History  Administered Date(s) Administered  . Pneumococcal Conjugate-13 12/11/2013  . Pneumococcal Polysaccharide-23 01/13/2010  . Tdap 10/05/2010    Diabetes  She presents for her follow-up diabetic visit. She has type 2 diabetes mellitus. Onset time: She was diagnosed at approximate age of 73 years. Her disease course has been improving. There are no hypoglycemic associated symptoms. Pertinent negatives for hypoglycemia include no confusion, headaches, pallor or seizures. Associated symptoms include fatigue. Pertinent negatives for diabetes include no blurred vision, no chest pain, no polydipsia and no polyphagia. There are no hypoglycemic complications. Symptoms are improving. Diabetic complications include nephropathy and retinopathy. Risk factors for coronary artery disease include dyslipidemia, diabetes mellitus, obesity and sedentary lifestyle. Her weight is fluctuating minimally. She is following a generally unhealthy diet. When asked about meal planning, she reported none. She has had a previous visit with a dietitian. She rarely participates in exercise. Her breakfast  blood glucose range is generally 140-180 mg/dl. Her bedtime blood glucose range is generally 140-180 mg/dl. Her overall blood glucose range is 140-180 mg/dl. Eye exam is current.  Hyperlipidemia  This is a chronic problem. The current episode started more than 1 year ago. The problem is uncontrolled. Exacerbating diseases include diabetes and obesity. Pertinent negatives include no chest pain, myalgias or shortness of breath. Current antihyperlipidemic treatment includes statins. Risk factors for coronary artery disease include dyslipidemia, diabetes mellitus, hypertension, obesity, a sedentary lifestyle and post-menopausal.  Hypertension  This is a chronic problem. The current episode started more than 1 year ago. Pertinent negatives include no blurred vision, chest pain, headaches, palpitations or shortness of breath. Risk factors for coronary artery disease include dyslipidemia, diabetes mellitus, obesity and sedentary lifestyle. Hypertensive end-organ damage includes retinopathy.  Review of Systems  Constitutional: Positive for fatigue. Negative for chills, fever and unexpected weight change.  HENT: Negative for trouble swallowing and voice change.   Eyes: Negative for blurred vision and visual disturbance.  Respiratory: Negative for cough, shortness of breath and wheezing.   Cardiovascular: Negative for chest pain, palpitations and leg swelling.  Gastrointestinal: Negative for diarrhea, nausea and vomiting.  Endocrine: Negative for cold intolerance, heat intolerance, polydipsia and polyphagia.  Musculoskeletal: Negative for arthralgias and myalgias.  Skin: Negative for color change, pallor, rash and wound.  Neurological: Negative for seizures and headaches.  Psychiatric/Behavioral: Negative for confusion and suicidal ideas.    Objective:    BP 131/74   Pulse 67   Ht 5\' 5"  (1.651 m)   Wt 179 lb (81.2 kg)   BMI 29.79 kg/m   Wt Readings from Last 3 Encounters:  08/25/17 179 lb (81.2  kg)  07/19/17 176 lb (79.8 kg)  07/17/17 176 lb (79.8 kg)    Physical Exam  Constitutional: She is oriented to person, place, and time. She appears well-developed.  HENT:  Head: Normocephalic and atraumatic.  Eyes: Pupils are equal, round, and reactive to light.  Neck: Normal range of motion. Neck supple. No tracheal deviation present. No thyromegaly present.  Cardiovascular: Normal rate.  Pulmonary/Chest: Effort normal.  Abdominal: There is no tenderness. There is no guarding.  Musculoskeletal: Normal range of motion. She exhibits no edema.  Neurological: She is alert and oriented to person, place, and time. She has normal reflexes. No cranial nerve deficit. Coordination normal.  Skin: Skin is warm and dry. No rash noted. No erythema. No pallor.  Psychiatric: She has a normal mood and affect. Judgment normal.    CMP ( most recent) CMP     Component Value Date/Time   NA 138 08/17/2017 0831   K 4.4 08/17/2017 0831   CL 100 08/17/2017 0831   CO2 33 (H) 08/17/2017 0831   GLUCOSE 151 (H) 08/17/2017 0831   BUN 11 08/17/2017 0831   CREATININE 1.02 (H) 08/17/2017 0831   CALCIUM 9.8 08/17/2017 0831   PROT 7.1 08/17/2017 0831   ALBUMIN 4.0 10/07/2016 0723   AST 21 08/17/2017 0831   ALT 19 08/17/2017 0831   ALKPHOS 48 10/07/2016 0723   BILITOT 0.4 08/17/2017 0831   GFRNONAA 54 (L) 08/17/2017 0831   GFRAA 62 08/17/2017 0831     Diabetic Labs (most recent): Lab Results  Component Value Date   HGBA1C 8.1 (H) 08/17/2017   HGBA1C 9.3 (H) 05/04/2017   HGBA1C 9.0 (H) 01/18/2017    Lipid Panel     Component Value Date/Time   CHOL 171 03/15/2017 0742   TRIG 227 (H) 03/15/2017 0742   HDL 40 (L) 03/15/2017 0742   CHOLHDL 4.3 03/15/2017 0742   VLDL 43 (H) 07/05/2016 0715   LDLCALC 97 03/15/2017 0742     Assessment & Plan:   1. Type 2 diabetes mellitus with stage 1 chronic kidney disease, with long-term current use of insulin ([place])  - Patient has currently uncontrolled  symptomatic type 2 DM since  77 years of age. - She did bring her logs, showing  controlled fasting and postprandial glucose profile on adjusted dose of Levemir.  - Her most recent labs show A1c of 8.1% improving from 9.3%.   Recent labs reviewed, showing improving renal function.     Her diabetes is complicated by CKD obesity/sedentary life and patient remains at a high risk for more acute and chronic complications of  diabetes which include CAD, CVA, CKD, retinopathy, and neuropathy. These are all discussed in detail with the patient.  - I have counseled the patient on diet management and weight loss, by adopting a carbohydrate restricted/protein rich diet.  -  Suggestion is made for her to avoid simple carbohydrates  from her diet including Cakes, Sweet Desserts / Pastries, Ice Cream, Soda (diet and regular), Sweet Tea, Candies, Chips, Cookies, Store Bought Juices, Alcohol in Excess of  1-2 drinks a day, Artificial Sweeteners, and "Sugar-free" Products. This will help patient to have stable blood glucose profile and potentially avoid unintended weight gain.   - I encouraged the patient to switch to  unprocessed or minimally processed complex starch and increased protein intake (animal or plant source), fruits, and vegetables.  - Patient is advised to stick to a routine mealtimes to eat 3 meals  a day and avoid unnecessary snacks ( to snack only to correct hypoglycemia).   - I have approached patient with the following individualized plan to manage diabetes and patient agrees:   -   based on her presentation with  controlled fasting and postprandial glucose profile,  she will not need bolus insulin for now. - However she will continue to require basal insulin. - I discussed and continued Levemir 60 units nightly, associated with strict monitoring of blood glucose 2 times a day-daily before breakfast and at bedtime.  -Patient is encouraged to call clinic for blood glucose levels less than 70  or above 200 mg /dl. - I will continue metformin 500 mg by mouth twice a day, therapeutically suitable for patient. -Patient is not a candidate for  SGLT2 inhibitors due to CKD.  - Patient will be considered for incretin therapy as appropriate next visit. - Patient specific target  A1c;  LDL, HDL, Triglycerides, and  Waist Circumference were discussed in detail.  2) BP/HTN: Her blood pressure is controlled to target.   I advised her to continue her current blood pressure medications, including Benazepril 40 mg by mouth daily.  3) Lipids/HPL:   Uncontrolled, LDL at 97.   Patient is advised tocontinue statins. 4)  Weight/Diet: CDE Consult has been initiated , exercise, and detailed carbohydrates information provided.  5) Chronic Care/Health Maintenance:  -Patient is on ACEI/ARB and Statin medications and encouraged to continue to follow up with Ophthalmology, Podiatrist at least yearly or according to recommendations, and advised to  stay away from smoking. I have recommended yearly flu vaccine and pneumonia vaccination at least every 5 years; moderate intensity exercise for up to 150 minutes weekly; and  sleep for at least 7 hours a day.   - I advised patient to maintain close follow up with Fayrene Helper, MD for primary care needs. - Time spent with the patient: 25 min, of which >50% was spent in reviewing her blood glucose logs , discussing her hypo- and hyper-glycemic episodes, reviewing her current and  previous labs and insulin doses and developing a plan to avoid hypo- and hyper-glycemia. Please refer to Patient Instructions for Blood Glucose Monitoring and Insulin/Medications Dosing Guide"  in media tab for additional information. Joanna Brown participated in the discussions, expressed understanding, and voiced agreement with the above plans.  All questions were answered to her satisfaction. she is encouraged to contact clinic should she have any questions or concerns prior to her  return visit.  Follow up plan: - Return in about 4 months (around 12/25/2017) for meter, and logs.  Glade Lloyd, MD Phone: [phone]  Fax: [phone]   This note was partially dictated with voice recognition software. Similar sounding words can be transcribed inadequately or may not  be corrected upon review.  08/25/2017, 12:48 PM

## 2017-08-28 ENCOUNTER — Ambulatory Visit (HOSPITAL_COMMUNITY)
Admission: RE | Admit: 2017-08-28 | Discharge: 2017-08-28 | Disposition: A | Discharge status: Home / Self Care | Payer: Medicare Other | Source: Ambulatory Visit | Attending: Family Medicine | Admitting: Family Medicine

## 2017-08-28 ENCOUNTER — Encounter (HOSPITAL_COMMUNITY): Payer: Self-pay

## 2017-08-28 DIAGNOSIS — Z1231 Encounter for screening mammogram for malignant neoplasm of breast: Secondary | ICD-10-CM | POA: Diagnosis not present

## 2017-09-12 ENCOUNTER — Encounter: Payer: Self-pay | Admitting: Internal Medicine

## 2017-09-12 ENCOUNTER — Other Ambulatory Visit: Payer: Self-pay

## 2017-09-12 ENCOUNTER — Ambulatory Visit (INDEPENDENT_AMBULATORY_CARE_PROVIDER_SITE_OTHER): Payer: Medicare Other | Admitting: Internal Medicine

## 2017-09-12 ENCOUNTER — Telehealth: Payer: Self-pay | Admitting: "Endocrinology

## 2017-09-12 VITALS — BP 132/70 | Ht 65.0 in | Wt 177.4 lb

## 2017-09-12 DIAGNOSIS — N181 Chronic kidney disease, stage 1: Secondary | ICD-10-CM | POA: Diagnosis not present

## 2017-09-12 DIAGNOSIS — E1122 Type 2 diabetes mellitus with diabetic chronic kidney disease: Secondary | ICD-10-CM | POA: Diagnosis not present

## 2017-09-12 DIAGNOSIS — E782 Mixed hyperlipidemia: Secondary | ICD-10-CM | POA: Diagnosis not present

## 2017-09-12 DIAGNOSIS — I10 Essential (primary) hypertension: Secondary | ICD-10-CM | POA: Diagnosis not present

## 2017-09-12 DIAGNOSIS — Z794 Long term (current) use of insulin: Secondary | ICD-10-CM | POA: Diagnosis not present

## 2017-09-12 DIAGNOSIS — I350 Nonrheumatic aortic (valve) stenosis: Secondary | ICD-10-CM | POA: Diagnosis not present

## 2017-09-12 MED ORDER — INSULIN DETEMIR 100 UNIT/ML FLEXPEN: 60.0000 [IU] | PEN_INJECTOR | Freq: Every day | SUBCUTANEOUS | 2 refills | Status: DC

## 2017-09-12 MED ORDER — INSULIN PEN NEEDLE 31G X 8 MM MISC: 3 refills | Status: DC

## 2017-09-12 MED ORDER — METFORMIN HCL 500 MG PO TABS: ORAL_TABLET | ORAL | 2 refills | Status: DC

## 2017-09-12 MED ORDER — LANCETS ULTRA FINE MISC: 1.0000 | Freq: Four times a day (QID) | 5 refills | Status: DC

## 2017-09-12 MED ORDER — EZETIMIBE 10 MG PO TABS: 10.0000 mg | ORAL_TABLET | Freq: Every day | ORAL | 3 refills | Status: DC

## 2017-09-12 MED ORDER — GLUCOSE BLOOD VI STRP: ORAL_STRIP | 5 refills | Status: DC

## 2017-09-12 NOTE — Patient Instructions (Signed)
Medication Instructions:   START zetia 10 mg once daily  Labwork:  FASTING lab work in 3 months to check cholesterol  Testing/Procedures:  NONE  Follow-Up:  Your physician wants you to follow-up in: ONE YEAR with Dr. [name]. You will receive a reminder letter in the mail two months in advance. If you don't receive a letter, please call our office to schedule the follow-up appointment.  If you need a refill on your cardiac medications before your next appointment, please call your pharmacy.  Any Other Special Instructions Will Be Listed Below (If Applicable).

## 2017-09-12 NOTE — Progress Notes (Signed)
OFFICE NOTE  Chief Complaint:  No complaints  Primary Care Physician: Fayrene Helper, MD  HPI:  Joanna Brown is a 77 year old female who has a history of mild aortic stenosis with a valve area of approximately 1.7 cm, also a history of dyslipidemia and hypertension, both of which have been well controlled. We are seeing her back for an annual visit today. She reports actually feeling very well, started walking every day, has made major changes to her diet as reflected by a marked decrease in triglycerides. Unfortunately recently she had a mild elevation in liver enzymes slightly above normal on lovastatin. Typically we could tolerate liver enzymes up to 3 times normal on statin medications, however, she was taken off the lovastatin. Either way it is questionable whether she really needs the additional statin at this time and this certainly argues that she should have further workup as to why she had elevated liver enzymes, whether this is due to a new non-alcoholic steatohepatitis or perhaps concomitant medications causing her elevated liver enzymes.  In fact, I reviewed her CT scan today he years ago and she does have steatohepatitis.  Therefore she is not a good candidate for a statin medication. Her blood pressure seems to be well-controlled on her current regimen.  I saw Joanna Brown back in the office today. She is without any new complaints. She occasionally gets some heartburn and is not currently taking medication for that. She denies any chest pain or worsening shortness of breath.  Joanna Brown returns today for follow-up. Again she is without complaints. We discussed her aortic stenosis however she seemed to have little recollection that she has this disorder. I again went over aortic stenosis and the fact that she has mild to moderate narrowing of the aortic valve. This time we are monitoring it clinically. Her last echo was in April of last year. She is asymptomatic. Her blood  pressure is well controlled. She had recent laboratory work which shows an LDL cholesterol of 70 in October which is good control. Her hemoglobin A1c is 7 which is down from 8.3 prior to that. I've encouraged her to continue with this trend is good cholesterol and blood sugar control her helpful in slowing the process of aortic stenosis.  09/12/2016  Joanna Brown was seen today in follow-up. Overall she seems to be doing well. She has no complaints such as shortness of breath or chest pain. EKG is stable showing normal sinus rhythm at 70 with LAFB. Blood pressure is at goal today. She reports her 11 A1c is in the low 7 range. Cholesterol is also been fairly well controlled. She does have a history of moderate aortic stenosis and is due for repeat echo.  09/12/2017  Joanna Brown was seen today in follow-up.  Over the past year she denies any chest pain or worsening shortness of breath.  Unfortunately her hemoglobin A1c is up from the low sevens to 8.2.  Cholesterol also is higher than goal.  Her total cholesterol is 171, HDL 40, LDL 97 and triglycerides 227.  She reports compliance with pravastatin.  She has moderate aortic stenosis with a mean gradient of 20 mmHg based on echo last year and normal LV function.  She did report one brief episode of chest discomfort with more marked exertion a few days ago.  This was right sided underneath the right breast and was a crampy sensation which improved after resting.  I advised that she monitor that further and if  she has more recurrence we may need to consider stress testing.  PMHx:  Past Medical History:  Diagnosis Date  . Blood transfusion    1980  . Diabetes mellitus   . Dysrhythmia   . Female bladder prolapse   . GERD (gastroesophageal reflux disease)   . Heart disease   . Hyperlipidemia   . Hypertension    echo and stress 4/10 reports on chart, EKG ` LOV 9/12 on chart  . Mild aortic stenosis     Past Surgical History:  Procedure Laterality  Date  . ABDOMINAL HYSTERECTOMY    . ANTERIOR AND POSTERIOR REPAIR  04/26/2011   Procedure: ANTERIOR (CYSTOCELE) AND POSTERIOR REPAIR (RECTOCELE);  Surgeon: Reece Packer, MD;  Location: [facility];  Service: Urology;  Laterality: N/A;  . CATARACT EXTRACTION Bilateral    with IOL  . CHOLECYSTECTOMY  2009  . COLONOSCOPY N/A 12/02/2013   Procedure: COLONOSCOPY;  Surgeon: Danie Binder, MD;  Location: [facility];  Service: Endoscopy;  Laterality: N/A;  9:30 AM - rescheduled to 8:30 - Doris to notify pt  . COLONOSCOPY W/ POLYPECTOMY    . LEFT HEART CATH  09/10/2008   normal coronary arteries, normal LV systolic function, EF 44% (Dr. [name])  . LYMPH NODE DISSECTION Right 1997   under arm  . NM MYOCAR PERF WALL MOTION  2010   dipyridamole - mild-mod in intenstiy perfusion defect in mid anterior, mid anteroseptal wall, EF 70%  . OVARY SURGERY     bilateral tumors removed  . THYROIDECTOMY    . THYROIDECTOMY  02/2008  . TRANSTHORACIC ECHOCARDIOGRAM  08/2011   EF=>55%, mild conc LVH; trace MR; mild TR; mild-mod AV calcification with mild valvular AV stenosis  . VAGINAL PROLAPSE REPAIR  04/26/2011   Procedure: VAGINAL VAULT SUSPENSION;  Surgeon: Reece Packer, MD;  Location: [facility];  Service: Urology;  Laterality: N/A;  with Graft  10x6    FAMHx:  Family History  Problem Relation Age of Onset  . Stomach cancer Mother 35  . Heart disease Mother 32       heart disease  . Heart disease Father 44       MI  . Stroke Maternal Grandfather   . Heart attack Paternal Grandfather   . Hypertension Brother   . Bone cancer Brother   . Hypertension Sister   . Liver cancer Sister   . Hypertension Sister   . Hypertension Child     SOCHx:   reports that she has never smoked. She has never used smokeless tobacco. She reports that she does not drink alcohol or use drugs.  ALLERGIES:  Allergies  Allergen Reactions  . Spironolactone     Stomach problems, vision changes      ROS: Pertinent items noted in HPI and remainder of comprehensive ROS otherwise negative.  HOME MEDS: Current Outpatient Medications  Medication Sig Dispense Refill  . amLODipine (NORVASC) 10 MG tablet TAKE 1 TABLET(10 MG) BY MOUTH EVERY MORNING 90 tablet 1  . aspirin 81 MG tablet Take 81 mg by mouth every morning.     . B-D ULTRAFINE III SHORT PEN 31G X 8 MM MISC USE AS DIRECTED WITH INSULIN PENS 100 each 3  . benazepril (LOTENSIN) 40 MG tablet TAKE 1 TABLET(40 MG) BY MOUTH DAILY 90 tablet 1  . brimonidine (ALPHAGAN P) 0.1 % SOLN at bedtime.    . Calcium Carbonate-Vit D-Min (CALCIUM 1200) 1200-1000 MG-UNIT CHEW Chew 1 each by mouth daily.    Marland Kitchen  ferrous sulfate 325 (65 FE) MG tablet Take 325 mg by mouth daily with breakfast.    . glucose blood (TRUE METRIX BLOOD GLUCOSE TEST) test strip Test BG 4 x daily E11.65 400 each 1  . Insulin Detemir (LEVEMIR FLEXTOUCH) 100 UNIT/ML Pen Inject 60 Units into the skin at bedtime. 60 mL 0  . metFORMIN (GLUCOPHAGE) 500 MG tablet TAKE 1 TABLET(500 MG) BY MOUTH DAILY WITH BREAKFAST 60 tablet 2  . Multiple Vitamins-Minerals (CENTRUM) tablet Take 1 tablet by mouth every morning.     . Nutritional Supplements (NUTRITIONAL SUPPLEMENT PO) Take by mouth. Neurx-TF (formulation for peripheral nerve health)    . Omega-3 Fatty Acids (FISH OIL) 1200 MG CAPS Take 1 capsule by mouth every morning.     . pravastatin (PRAVACHOL) 80 MG tablet TAKE 1 TABLET(80 MG) BY MOUTH DAILY 90 tablet 0  . Travoprost, BAK Free, (TRAVATAN) 0.004 % SOLN ophthalmic solution Place 1 drop into both eyes at bedtime.     . triamterene-hydrochlorothiazide (MAXZIDE) 75-50 MG tablet Take 1 tablet daily by mouth. 90 tablet 1   No current facility-administered medications for this visit.     LABS/IMAGING: No results found for this or any previous visit (from the past 48 hour(s)). No results found.  VITALS: BP 132/70   Ht 5\' 5"  (1.651 m)   Wt 177 lb 6.4 oz (80.5 kg)   BMI 29.52 kg/m    EXAM: General appearance: alert and no distress Neck: no carotid bruit and no JVD Lungs: clear to auscultation bilaterally Heart: regular rate and rhythm, S1, S2 normal and systolic murmur: early systolic 3/6, crescendo at 2nd left intercostal space Abdomen: soft, non-tender; bowel sounds normal; no masses,  no organomegaly Extremities: extremities normal, atraumatic, no cyanosis or edema Pulses: 2+ and symmetric Skin: Skin color, texture, turgor normal. No rashes or lesions Neurologic: Grossly normal Psych: Mood, affect normal  EKG: Sinus rhythm at 65-personally reviewed  ASSESSMENT: 1. Hypertension-controlled 2. Dyslipidemia 3. Mild to moderate aortic stenosis 4. NAFLD 5. GERD 6. DM2 on insulin  PLAN: 1.   Joanna Brown is doing well and asymptomatic.  Her A1c is higher and she needs to work with her endocrinologist to try to get that down.  She has an appointment in August and I have encouraged her to be very strict about carbohydrate reduction over the next several months.  Blood pressure is at goal.  Her cholesterol is higher than target.  She is hesitant to switch to high potency statin.  She said she had some nausea on atorvastatin in the past.  She feels somewhat nauseated on pravastatin, however it may be the combination of all of her medicines.  I would like to add ezetimibe for additional benefit.  Plan repeat lipid profile in 3 months.  Follow-up with me otherwise annually or sooner as necessary.  We will likely repeat her echocardiogram at her next office visit.  [name], MD, [facility], [place] Director of the [facility]  [facility] Diplomate of the American Board of Clinical Lipidology Attending Cardiologist  Direct Dial: [phone]  Fax: [phone]  Website:  [email or web address]  Nadean Corwin Kahlyn Shippey 09/12/2017, 9:00 AM

## 2017-10-05 DIAGNOSIS — L602 Onychogryphosis: Secondary | ICD-10-CM | POA: Diagnosis not present

## 2017-10-05 DIAGNOSIS — E1351 Other specified diabetes mellitus with diabetic peripheral angiopathy without gangrene: Secondary | ICD-10-CM | POA: Diagnosis not present

## 2017-10-18 ENCOUNTER — Other Ambulatory Visit: Payer: Self-pay | Admitting: Family Medicine

## 2017-10-21 ENCOUNTER — Other Ambulatory Visit: Payer: Self-pay | Admitting: Family Medicine

## 2017-10-24 ENCOUNTER — Telehealth: Payer: Self-pay | Admitting: "Endocrinology

## 2017-10-24 MED ORDER — GLUCOSE BLOOD VI STRP: ORAL_STRIP | 5 refills | Status: DC

## 2017-10-24 MED ORDER — INSULIN DETEMIR 100 UNIT/ML FLEXPEN: 60.0000 [IU] | PEN_INJECTOR | Freq: Every day | SUBCUTANEOUS | 2 refills | Status: DC

## 2017-10-24 MED ORDER — INSULIN PEN NEEDLE 31G X 8 MM MISC: 3 refills | Status: DC

## 2017-10-24 NOTE — Telephone Encounter (Signed)
Joanna Brown is calling stating she needs refills on glucose blood (TRUE METRIX BLOOD GLUCOSE TEST) test strips, Insulin Detemir (LEVEMIR FLEXTOUCH) 100 UNIT/ML Pen, LANCETS ULTRA FINE MISC  Sent to [facility] in [place]

## 2017-10-24 NOTE — Telephone Encounter (Signed)
Rx Sent  

## 2017-11-29 ENCOUNTER — Other Ambulatory Visit: Payer: Self-pay | Admitting: Family Medicine

## 2017-12-05 ENCOUNTER — Other Ambulatory Visit (HOSPITAL_COMMUNITY)
Admission: RE | Admit: 2017-12-05 | Discharge: 2017-12-05 | Disposition: A | Discharge status: Home / Self Care | Payer: Medicare Other | Source: Ambulatory Visit | Attending: "Endocrinology | Admitting: "Endocrinology

## 2017-12-05 ENCOUNTER — Other Ambulatory Visit (HOSPITAL_COMMUNITY)
Admission: RE | Admit: 2017-12-05 | Discharge: 2017-12-05 | Disposition: A | Discharge status: Home / Self Care | Payer: Medicare Other | Source: Ambulatory Visit | Attending: Internal Medicine | Admitting: Internal Medicine

## 2017-12-05 DIAGNOSIS — N181 Chronic kidney disease, stage 1: Secondary | ICD-10-CM | POA: Diagnosis present

## 2017-12-05 DIAGNOSIS — E1122 Type 2 diabetes mellitus with diabetic chronic kidney disease: Secondary | ICD-10-CM | POA: Diagnosis present

## 2017-12-05 DIAGNOSIS — Z794 Long term (current) use of insulin: Secondary | ICD-10-CM | POA: Diagnosis present

## 2017-12-05 DIAGNOSIS — E782 Mixed hyperlipidemia: Secondary | ICD-10-CM | POA: Diagnosis not present

## 2017-12-05 LAB — LIPID PANEL
Cholesterol: 142 mg/dL (ref 0–200)
HDL: 38 mg/dL — ABNORMAL LOW (ref >40)
LDL Cholesterol: 65 mg/dL (ref 0–99)
Total CHOL/HDL Ratio: 3.7 RATIO
Triglycerides: 194 mg/dL — ABNORMAL HIGH (ref <150)
VLDL: 39 mg/dL (ref 0–40)

## 2017-12-05 LAB — COMPREHENSIVE METABOLIC PANEL
ALT: 25 U/L (ref 0–44)
AST: 24 U/L (ref 15–41)
Albumin: 4.2 g/dL (ref 3.5–5.0)
Alkaline Phosphatase: 50 U/L (ref 38–126)
Anion gap: 8 (ref 5–15)
BUN: 14 mg/dL (ref 8–23)
CO2: 31 mmol/L (ref 22–32)
Calcium: 9.6 mg/dL (ref 8.9–10.3)
Chloride: 96 mmol/L — ABNORMAL LOW (ref 98–111)
Creatinine, Ser: 1 mg/dL (ref 0.44–1.00)
GFR calc Af Amer: >60 mL/min (ref >60)
GFR calc non Af Amer: 54 mL/min — ABNORMAL LOW (ref >60)
Glucose, Bld: 188 mg/dL — ABNORMAL HIGH (ref 70–99)
Potassium: 4.1 mmol/L (ref 3.5–5.1)
Sodium: 135 mmol/L (ref 135–145)
Total Bilirubin: 0.4 mg/dL (ref 0.3–1.2)
Total Protein: 7.7 g/dL (ref 6.5–8.1)

## 2017-12-05 LAB — HEMOGLOBIN A1C
Hgb A1c MFr Bld: 9.2 % — ABNORMAL HIGH (ref 4.8–5.6)
Mean Plasma Glucose: 217.34 mg/dL

## 2017-12-25 ENCOUNTER — Encounter: Payer: Self-pay | Admitting: "Endocrinology

## 2017-12-25 ENCOUNTER — Ambulatory Visit (INDEPENDENT_AMBULATORY_CARE_PROVIDER_SITE_OTHER): Payer: Medicare Other | Admitting: "Endocrinology

## 2017-12-25 VITALS — BP 140/83 | HR 69 | Ht 65.0 in | Wt 179.0 lb

## 2017-12-25 DIAGNOSIS — E6609 Other obesity due to excess calories: Secondary | ICD-10-CM

## 2017-12-25 DIAGNOSIS — Z794 Long term (current) use of insulin: Secondary | ICD-10-CM

## 2017-12-25 DIAGNOSIS — I10 Essential (primary) hypertension: Secondary | ICD-10-CM | POA: Diagnosis not present

## 2017-12-25 DIAGNOSIS — E782 Mixed hyperlipidemia: Secondary | ICD-10-CM

## 2017-12-25 DIAGNOSIS — Z6830 Body mass index (BMI) 30.0-30.9, adult: Secondary | ICD-10-CM | POA: Diagnosis not present

## 2017-12-25 DIAGNOSIS — E1122 Type 2 diabetes mellitus with diabetic chronic kidney disease: Secondary | ICD-10-CM

## 2017-12-25 DIAGNOSIS — N181 Chronic kidney disease, stage 1: Secondary | ICD-10-CM | POA: Diagnosis not present

## 2017-12-25 MED ORDER — INSULIN DETEMIR 100 UNIT/ML FLEXPEN: 70.0000 [IU] | PEN_INJECTOR | Freq: Every day | SUBCUTANEOUS | 2 refills | Status: DC

## 2017-12-25 NOTE — Patient Instructions (Signed)

## 2017-12-25 NOTE — Progress Notes (Signed)
Endocrinology follow-up note   Subjective:    Patient ID: Joanna Brown, female    DOB: Sep 24, 1940. Patient is being seen  For follow-up for management of diabetes requested by  Fayrene Helper, MD  Past Medical History:  Diagnosis Date  . Blood transfusion    1980  . Diabetes mellitus   . Dysrhythmia   . Female bladder prolapse   . GERD (gastroesophageal reflux disease)   . Heart disease   . Hyperlipidemia   . Hypertension    echo and stress 4/10 reports on chart, EKG ` LOV 9/12 on chart  . Mild aortic stenosis    Past Surgical History:  Procedure Laterality Date  . ABDOMINAL HYSTERECTOMY    . ANTERIOR AND POSTERIOR REPAIR  04/26/2011   Procedure: ANTERIOR (CYSTOCELE) AND POSTERIOR REPAIR (RECTOCELE);  Surgeon: Reece Packer, MD;  Location: [facility];  Service: Urology;  Laterality: N/A;  . CATARACT EXTRACTION Bilateral    with IOL  . CHOLECYSTECTOMY  2009  . COLONOSCOPY N/A 12/02/2013   Procedure: COLONOSCOPY;  Surgeon: Danie Binder, MD;  Location: [facility];  Service: Endoscopy;  Laterality: N/A;  9:30 AM - rescheduled to 8:30 - Doris to notify pt  . COLONOSCOPY W/ POLYPECTOMY    . LEFT HEART CATH  09/10/2008   normal coronary arteries, normal LV systolic function, EF 31% (Dr. [name])  . LYMPH NODE DISSECTION Right 1997   under arm  . NM MYOCAR PERF WALL MOTION  2010   dipyridamole - mild-mod in intenstiy perfusion defect in mid anterior, mid anteroseptal wall, EF 70%  . OVARY SURGERY     bilateral tumors removed  . THYROIDECTOMY    . THYROIDECTOMY  02/2008  . TRANSTHORACIC ECHOCARDIOGRAM  08/2011   EF=>55%, mild conc LVH; trace MR; mild TR; mild-mod AV calcification with mild valvular AV stenosis  . VAGINAL PROLAPSE REPAIR  04/26/2011   Procedure: VAGINAL VAULT SUSPENSION;  Surgeon: Reece Packer, MD;  Location: [facility];  Service: Urology;  Laterality: N/A;  with Graft  10x6   Social History   Socioeconomic History  . Marital status: Married     Spouse name: [name]  . Number of children: 1  . Years of education: Trade  . Highest education level: Not on file  Occupational History  . Occupation: Retired  Scientific laboratory technician  . Financial resource strain: Not very hard  . Food insecurity:    Worry: Never true    Inability: Not on file  . Transportation needs:    Medical: No    Non-medical: No  Tobacco Use  . Smoking status: Never Smoker  . Smokeless tobacco: Never Used  Substance and Sexual Activity  . Alcohol use: No    Comment: socially- none x 30 years  . Drug use: No  . Sexual activity: Yes  Lifestyle  . Physical activity:    Days per week: 5 days    Minutes per session: 40 min  . Stress: Not on file  Relationships  . Social connections:    Talks on phone: Not on file    Gets together: Not on file    Attends religious service: Not on file    Active member of club or organization: Not on file    Attends meetings of clubs or organizations: Not on file    Relationship status: Not on file  Other Topics Concern  . Not on file  Social History Narrative   Patient lives at home with spouse.  Caffeine Use: 1 cup of coffee daily   Outpatient Encounter Medications as of 12/25/2017  Medication Sig  . amLODipine (NORVASC) 10 MG tablet TAKE 1 TABLET(10 MG) BY MOUTH EVERY MORNING  . aspirin 81 MG tablet Take 81 mg by mouth every morning.   . B-D ULTRAFINE III SHORT PEN 31G X 8 MM MISC USE AS DIRECTED WITH INSULIN PENS  . benazepril (LOTENSIN) 40 MG tablet TAKE 1 TABLET(40 MG) BY MOUTH DAILY  . benazepril (LOTENSIN) 40 MG tablet TAKE 1 TABLET(40 MG) BY MOUTH DAILY  . Calcium Carbonate-Vit D-Min (CALCIUM 1200) 1200-1000 MG-UNIT CHEW Chew 1 each by mouth daily.  . ferrous sulfate 325 (65 FE) MG tablet Take 325 mg by mouth daily with breakfast.  . glucose blood (TRUE METRIX BLOOD GLUCOSE TEST) test strip Test BG 4 x daily E11.65  . Insulin Detemir (LEVEMIR FLEXTOUCH) 100 UNIT/ML Pen Inject 70 Units into the skin at bedtime.  .  Insulin Pen Needle (B-D ULTRAFINE III SHORT PEN) 31G X 8 MM MISC USE AS DIRECTED qhs WITH INSULIN PENS  . LANCETS ULTRA FINE MISC 1 each by Does not apply route 4 (four) times daily.  . metFORMIN (GLUCOPHAGE) 500 MG tablet TAKE 1 TABLET(500 MG) BY MOUTH DAILY WITH BREAKFAST  . Multiple Vitamins-Minerals (CENTRUM) tablet Take 1 tablet by mouth every morning.   . Nutritional Supplements (NUTRITIONAL SUPPLEMENT PO) Take by mouth. Neurx-TF (formulation for peripheral nerve health)  . Omega-3 Fatty Acids (FISH OIL) 1200 MG CAPS Take 1 capsule by mouth every morning.   . pravastatin (PRAVACHOL) 80 MG tablet TAKE 1 TABLET(80 MG) BY MOUTH DAILY  . Travoprost, BAK Free, (TRAVATAN) 0.004 % SOLN ophthalmic solution Place 1 drop into both eyes at bedtime.   . triamterene-hydrochlorothiazide (MAXZIDE) 75-50 MG tablet Take 1 tablet daily by mouth.  . triamterene-hydrochlorothiazide (MAXZIDE) 75-50 MG tablet TAKE 1 TABLET BY MOUTH DAILY  . [DISCONTINUED] amLODipine (NORVASC) 10 MG tablet TAKE 1 TABLET(10 MG) BY MOUTH EVERY MORNING  . [DISCONTINUED] ezetimibe (ZETIA) 10 MG tablet Take 1 tablet (10 mg total) by mouth daily.  . [DISCONTINUED] Insulin Detemir (LEVEMIR FLEXTOUCH) 100 UNIT/ML Pen Inject 60 Units into the skin at bedtime.   No facility-administered encounter medications on file as of 12/25/2017.    ALLERGIES: Allergies  Allergen Reactions  . Spironolactone     Stomach problems, vision changes    VACCINATION STATUS: Immunization History  Administered Date(s) Administered  . Pneumococcal Conjugate-13 12/11/2013  . Pneumococcal Polysaccharide-23 01/13/2010  . Tdap 10/05/2010    Diabetes  She presents for her follow-up diabetic visit. She has type 2 diabetes mellitus. Onset time: She was diagnosed at approximate age of 51 years. Her disease course has been worsening. There are no hypoglycemic associated symptoms. Pertinent negatives for hypoglycemia include no confusion, headaches, pallor or  seizures. Pertinent negatives for diabetes include no blurred vision, no chest pain, no fatigue, no polydipsia and no polyphagia. There are no hypoglycemic complications. Symptoms are worsening. Diabetic complications include nephropathy and retinopathy. Risk factors for coronary artery disease include dyslipidemia, diabetes mellitus, obesity and sedentary lifestyle. Her weight is stable. She is following a generally unhealthy diet. When asked about meal planning, she reported none. She has had a previous visit with a dietitian. She rarely participates in exercise. Her breakfast blood glucose range is generally 180-200 mg/dl. Her bedtime blood glucose range is generally 180-200 mg/dl. Her overall blood glucose range is 180-200 mg/dl. Eye exam is current.  Hyperlipidemia  This is a chronic problem.  The current episode started more than 1 year ago. The problem is uncontrolled. Exacerbating diseases include diabetes and obesity. Pertinent negatives include no chest pain, myalgias or shortness of breath. Current antihyperlipidemic treatment includes statins. Risk factors for coronary artery disease include dyslipidemia, diabetes mellitus, hypertension, obesity, a sedentary lifestyle and post-menopausal.  Hypertension  This is a chronic problem. The current episode started more than 1 year ago. Pertinent negatives include no blurred vision, chest pain, headaches, palpitations or shortness of breath. Risk factors for coronary artery disease include dyslipidemia, diabetes mellitus, obesity and sedentary lifestyle. Hypertensive end-organ damage includes retinopathy.    Review of Systems  Constitutional: Negative for chills, fatigue, fever and unexpected weight change.  HENT: Negative for trouble swallowing and voice change.   Eyes: Negative for blurred vision and visual disturbance.  Respiratory: Negative for cough, shortness of breath and wheezing.   Cardiovascular: Negative for chest pain, palpitations and leg  swelling.  Gastrointestinal: Negative for diarrhea, nausea and vomiting.  Endocrine: Negative for cold intolerance, heat intolerance, polydipsia and polyphagia.  Musculoskeletal: Negative for arthralgias and myalgias.  Skin: Negative for color change, pallor, rash and wound.  Neurological: Negative for seizures and headaches.  Psychiatric/Behavioral: Negative for confusion and suicidal ideas.    Objective:    BP 140/83   Pulse 69   Ht 5\' 5"  (1.651 m)   Wt 179 lb (81.2 kg)   BMI 29.79 kg/m   Wt Readings from Last 3 Encounters:  12/25/17 179 lb (81.2 kg)  09/12/17 177 lb 6.4 oz (80.5 kg)  08/25/17 179 lb (81.2 kg)    Physical Exam  Constitutional: She is oriented to person, place, and time. She appears well-developed.  HENT:  Head: Normocephalic and atraumatic.  Eyes: Pupils are equal, round, and reactive to light.  Neck: Normal range of motion. Neck supple. No tracheal deviation present. No thyromegaly present.  Cardiovascular: Normal rate.  Abdominal: There is no tenderness. There is no guarding.  Musculoskeletal: Normal range of motion. She exhibits no edema.  Neurological: She is alert and oriented to person, place, and time. No cranial nerve deficit. Coordination normal.  Skin: Skin is warm and dry. No rash noted. No erythema. No pallor.  Psychiatric: She has a normal mood and affect. Judgment normal.    CMP ( most recent) CMP     Component Value Date/Time   NA 135 12/05/2017 0823   K 4.1 12/05/2017 0823   CL 96 (L) 12/05/2017 0823   CO2 31 12/05/2017 0823   GLUCOSE 188 (H) 12/05/2017 0823   BUN 14 12/05/2017 0823   CREATININE 1.00 12/05/2017 0823   CREATININE 1.02 (H) 08/17/2017 0831   CALCIUM 9.6 12/05/2017 0823   PROT 7.7 12/05/2017 0823   ALBUMIN 4.2 12/05/2017 0823   AST 24 12/05/2017 0823   ALT 25 12/05/2017 0823   ALKPHOS 50 12/05/2017 0823   BILITOT 0.4 12/05/2017 0823   GFRNONAA 54 (L) 12/05/2017 0823   GFRNONAA 54 (L) 08/17/2017 0831   GFRAA >60  12/05/2017 0823   GFRAA 62 08/17/2017 0831     Diabetic Labs (most recent): Lab Results  Component Value Date   HGBA1C 9.2 (H) 12/05/2017   HGBA1C 8.1 (H) 08/17/2017   HGBA1C 9.3 (H) 05/04/2017    Lipid Panel     Component Value Date/Time   CHOL 142 12/05/2017 0832   TRIG 194 (H) 12/05/2017 0832   HDL 38 (L) 12/05/2017 0832   CHOLHDL 3.7 12/05/2017 0832   VLDL 39 12/05/2017 6269  [place] 65 12/05/2017 0832   LDLCALC 97 03/15/2017 0742     Assessment & Plan:   1. Type 2 diabetes mellitus with stage 1 chronic kidney disease, with long-term current use of insulin ([place])  - Patient has currently uncontrolled symptomatic type 2 DM since  77 years of age. - She did bring her logs, showing  uncontrolled fasting and postprandial glucose profile on adjusted dose of Levemir.  - Her most recent labs show A1c of 9.2% increasing from 8.1% .    Recent labs reviewed, showing improving renal function.     Her diabetes is complicated by CKD obesity/sedentary life and patient remains at a high risk for more acute and chronic complications of diabetes which include CAD, CVA, CKD, retinopathy, and neuropathy. These are all discussed in detail with the patient.  - I have counseled the patient on diet management and weight loss, by adopting a carbohydrate restricted/protein rich diet.  -  Suggestion is made for her to avoid simple carbohydrates  from her diet including Cakes, Sweet Desserts / Pastries, Ice Cream, Soda (diet and regular), Sweet Tea, Candies, Chips, Cookies, Store Bought Juices, Alcohol in Excess of  1-2 drinks a day, Artificial Sweeteners, and "Sugar-free" Products. This will help patient to have stable blood glucose profile and potentially avoid unintended weight gain.  - I encouraged the patient to switch to  unprocessed or minimally processed complex starch and increased protein intake (animal or plant source), fruits, and vegetables.  - Patient is advised to stick to a  routine mealtimes to eat 3 meals  a day and avoid unnecessary snacks ( to snack only to correct hypoglycemia).   - I have approached patient with the following individualized plan to manage diabetes and patient agrees:   -Based on her presentation with above target fasting blood glucose profile, she has a room on her basal insulin before considering prandial insulin. -I discussed and increase her Levemir to 70 units nightly associated with strict monitoring of blood glucose 2 times a day-daily before breakfast and at bedtime.  -Patient is encouraged to call clinic for blood glucose levels less than 70 or above 200 mg /dl. - I will continue metformin 500 mg by mouth twice a day, therapeutically suitable for patient. -Patient is not a candidate for  SGLT2 inhibitors due to CKD. -If her A1c remains above 9% on next visit, she will be considered for  prandial insulin in addition to her basal insulin.  - Patient specific target  A1c;  LDL, HDL, Triglycerides, and  Waist Circumference were discussed in detail.  2) BP/HTN: Her blood pressure is controlled to target.  She is advised to continue her current blood pressure medications including benazepril 40 mg p.o. daily.   3) Lipids/HPL: Her recent lipid panel showed uncontrolled LDL at 97, she is advised to continue pravastatin 80 mg p.o. nightly.   4)  Weight/Diet: CDE Consult has been initiated , exercise, and detailed carbohydrates information provided.  5) Chronic Care/Health Maintenance:  -Patient is on ACEI/ARB and Statin medications and encouraged to continue to follow up with Ophthalmology, Podiatrist at least yearly or according to recommendations, and advised to  stay away from smoking. I have recommended yearly flu vaccine and pneumonia vaccination at least every 5 years; moderate intensity exercise for up to 150 minutes weekly; and  sleep for at least 7 hours a day.   - I advised patient to maintain close follow up with Fayrene Helper, MD for primary care needs.  -  Time spent with the patient: 25 min, of which >50% was spent in reviewing her blood glucose logs , discussing her hypo- and hyper-glycemic episodes, reviewing her current and  previous labs and insulin doses and developing a plan to avoid hypo- and hyper-glycemia. Please refer to Patient Instructions for Blood Glucose Monitoring and Insulin/Medications Dosing Guide"  in media tab for additional information. Derinda Late Boltz participated in the discussions, expressed understanding, and voiced agreement with the above plans.  All questions were answered to her satisfaction. she is encouraged to contact clinic should she have any questions or concerns prior to her return visit.  Follow up plan: - Return in about 3 months (around 03/27/2018) for Follow up with Pre-visit Labs, Meter, and Logs.  Glade Lloyd, MD Phone: [phone]  Fax: [phone]   This note was partially dictated with voice recognition software. Similar sounding words can be transcribed inadequately or may not  be corrected upon review.  12/25/2017, 11:16 AM

## 2017-12-26 DIAGNOSIS — L602 Onychogryphosis: Secondary | ICD-10-CM | POA: Diagnosis not present

## 2017-12-26 DIAGNOSIS — E1351 Other specified diabetes mellitus with diabetic peripheral angiopathy without gangrene: Secondary | ICD-10-CM | POA: Diagnosis not present

## 2018-01-18 ENCOUNTER — Encounter: Payer: Self-pay | Admitting: Family Medicine

## 2018-01-18 ENCOUNTER — Telehealth: Payer: Self-pay | Admitting: Gastroenterology

## 2018-01-18 ENCOUNTER — Ambulatory Visit (INDEPENDENT_AMBULATORY_CARE_PROVIDER_SITE_OTHER): Payer: Medicare Other | Admitting: Family Medicine

## 2018-01-18 ENCOUNTER — Ambulatory Visit (INDEPENDENT_AMBULATORY_CARE_PROVIDER_SITE_OTHER): Payer: Medicare Other | Admitting: Gastroenterology

## 2018-01-18 ENCOUNTER — Encounter: Payer: Self-pay | Admitting: Gastroenterology

## 2018-01-18 VITALS — BP 138/74 | HR 74 | Resp 16 | Ht 65.0 in | Wt 178.0 lb

## 2018-01-18 DIAGNOSIS — E782 Mixed hyperlipidemia: Secondary | ICD-10-CM | POA: Diagnosis not present

## 2018-01-18 DIAGNOSIS — I10 Essential (primary) hypertension: Secondary | ICD-10-CM | POA: Diagnosis not present

## 2018-01-18 DIAGNOSIS — E1122 Type 2 diabetes mellitus with diabetic chronic kidney disease: Secondary | ICD-10-CM

## 2018-01-18 DIAGNOSIS — R198 Other specified symptoms and signs involving the digestive system and abdomen: Secondary | ICD-10-CM

## 2018-01-18 DIAGNOSIS — Z794 Long term (current) use of insulin: Secondary | ICD-10-CM

## 2018-01-18 DIAGNOSIS — Z86010 Personal history of colonic polyps: Secondary | ICD-10-CM | POA: Diagnosis not present

## 2018-01-18 DIAGNOSIS — N181 Chronic kidney disease, stage 1: Secondary | ICD-10-CM | POA: Diagnosis not present

## 2018-01-18 MED ORDER — BENAZEPRIL HCL 40 MG PO TABS: ORAL_TABLET | ORAL | 1 refills | Status: DC

## 2018-01-18 MED ORDER — AMLODIPINE BESYLATE 10 MG PO TABS: ORAL_TABLET | ORAL | 1 refills | Status: DC

## 2018-01-18 MED ORDER — TRIAMTERENE-HCTZ 75-50 MG PO TABS: 1.0000 | ORAL_TABLET | Freq: Every day | ORAL | 1 refills | Status: DC

## 2018-01-18 MED ORDER — PRAVASTATIN SODIUM 80 MG PO TABS: ORAL_TABLET | ORAL | 1 refills | Status: DC

## 2018-01-18 MED ORDER — POLYETHYLENE GLYCOL 3350 17 GM/SCOOP PO POWD: 17.0000 g | Freq: Every day | ORAL | 1 refills | Status: DC

## 2018-01-18 NOTE — Telephone Encounter (Signed)
Called patient pcp, patient there for appointment, they will tell the patient to be here for appointment at 11:30 today

## 2018-01-18 NOTE — Patient Instructions (Addendum)
DRINK WATER TO KEEP YOUR URINE LIGHT YELLOW.  FOLLOW A HIGH FIBER DIET.  AVOID ITEMS THAT CAUSE BLOATING & GAS. SEE INFO BELOW.   ADD LINZESS 30 MINS PRIOR TO BREAKFAST. YOU SHOULD HAVE A BM 1-3 HRS AFTER THE DOSE IF NOT TAKE THE LINZESS WITH BREAKFAST. IT CAN CAUSE EXPLOSIVE DIARRHEA.  PLEASE  CALL IN 7 DAYS IF YOUR CONSTIPATION IS NOT IMPROVED AND WE WILL INCREASE YOUR LINZESS DOSE TO 145 MC DAILY WITH BREAKFAST.   Stop the iron. RECHECK BLOOD COUNT AND IRON STORES IN JAN 2020 1 WEEK PRIOR TO YOUR NEXT OFFICE VISIT.   FOLLOW UP IN 4 MOS.    High-Fiber Diet A high-fiber diet changes your normal diet to include more whole grains, legumes, fruits, and vegetables. Changes in the diet involve replacing refined carbohydrates with unrefined foods. The calorie level of the diet is essentially unchanged. The Dietary Reference Intake (recommended amount) for adult males is 38 grams per day. For adult females, it is 25 grams per day. Pregnant and lactating women should consume 28 grams of fiber per day. Fiber is the intact part of a plant that is not broken down during digestion. Functional fiber is fiber that has been isolated from the plant to provide a beneficial effect in the body.  PURPOSE  Increase stool bulk.   Ease and regulate bowel movements.   Lower cholesterol.   REDUCE RISK OF COLON CANCER  INDICATIONS THAT YOU NEED MORE FIBER  Constipation and hemorrhoids.   Uncomplicated diverticulosis (intestine condition) and irritable bowel syndrome.   Weight management.   As a protective measure against hardening of the arteries (atherosclerosis), diabetes, and cancer.   GUIDELINES FOR INCREASING FIBER IN THE DIET  Start adding fiber to the diet slowly. A gradual increase of about 5 more grams (2 slices of whole-wheat bread, 2 servings of most fruits or vegetables, or 1 bowl of high-fiber cereal) per day is best. Too rapid an increase in fiber may result in constipation, flatulence,  and bloating.   Drink enough water and fluids to keep your urine clear or pale yellow. Water, juice, or caffeine-free drinks are recommended. Not drinking enough fluid may cause constipation.   Eat a variety of high-fiber foods rather than one type of fiber.   Try to increase your intake of fiber through using high-fiber foods rather than fiber pills or supplements that contain small amounts of fiber.   The goal is to change the types of food eaten. Do not supplement your present diet with high-fiber foods, but replace foods in your present diet.   INCLUDE A VARIETY OF FIBER SOURCES  Replace refined and processed grains with whole grains, canned fruits with fresh fruits, and incorporate other fiber sources. White rice, white breads, and most bakery goods contain little or no fiber.   Brown whole-grain rice, buckwheat oats, and many fruits and vegetables are all good sources of fiber. These include: broccoli, Brussels sprouts, cabbage, cauliflower, beets, sweet potatoes, white potatoes (skin on), carrots, tomatoes, eggplant, squash, berries, fresh fruits, and dried fruits.   Cereals appear to be the richest source of fiber. Cereal fiber is found in whole grains and bran. Bran is the fiber-rich outer coat of cereal grain, which is largely removed in refining. In whole-grain cereals, the bran remains. In breakfast cereals, the largest amount of fiber is found in those with "bran" in their names. The fiber content is sometimes indicated on the label.   You may need to include additional fruits  and vegetables each day.   In baking, for 1 cup white flour, you may use the following substitutions:   1 cup whole-wheat flour minus 2 tablespoons.   1/2 cup white flour plus 1/2 cup whole-wheat flour.

## 2018-01-18 NOTE — Telephone Encounter (Signed)
Contact pt to have OPV at 1130 today, Dx: Joanna Brown.

## 2018-01-18 NOTE — Progress Notes (Signed)
Subjective:    Patient ID: Joanna Brown, female    DOB: 1941/01/04, 77 y.o.   MRN: [id number]  Fayrene Helper, MD   HPI TROUBLE WITH CONSTIPATION HER WHOLE LIFE BUT  CHANGE IN BOWEL IN HABITS,: For past 2 mos had MORE TROUBLE WITH BOWELS. STARTED ON A NEW PILL: EZETIMIBE AND STOPPED IT LAST WEEK BECAUSE IT MADE HER DIZZY. BMs: LAST WEEK, NEVER FEELS LIKE SHE HAS A SATISFACTORY BOWEL MOVEMENT.  DRINKS PLENTY OF WATER. DOESN'T A LOT OF FIBER BUT EATS SOME. RARE PROBLEMS SWALLOWING: BREAD. IF EATS FRIED FOOD SHE GETS HEARTBURN. MEDS TRIED: DULCOLAX AND SENOKOT(MADE HER WEAK.)  PT DENIES FEVER, CHILLS, HEMATOCHEZIA, HEMATEMESIS, nausea, vomiting, melena, diarrhea, CHEST PAIN, SHORTNESS OF BREATH, abdominal pain, problems with sedation, OR indigestion.  Past Medical History:  Diagnosis Date  . Blood transfusion    1980  . Diabetes mellitus   . Dysrhythmia   . Female bladder prolapse   . GERD (gastroesophageal reflux disease)   . Heart disease   . Hyperlipidemia   . Hypertension    echo and stress 4/10 reports on chart, EKG ` LOV 9/12 on chart  . Mild aortic stenosis    Past Surgical History:  Procedure Laterality Date  . ABDOMINAL HYSTERECTOMY    . ANTERIOR AND POSTERIOR REPAIR  04/26/2011   Procedure: ANTERIOR (CYSTOCELE) AND POSTERIOR REPAIR (RECTOCELE);  Surgeon: Reece Packer, MD;  Location: [facility];  Service: Urology;  Laterality: N/A;  . CATARACT EXTRACTION Bilateral    with IOL  . CHOLECYSTECTOMY  2009  . COLONOSCOPY N/A 12/02/2013     . COLONOSCOPY W/ POLYPECTOMY    . LEFT HEART CATH  09/10/2008   normal coronary arteries, normal LV systolic function, EF 28% (Dr. [name])  . LYMPH NODE DISSECTION Right 1997   under arm  . NM MYOCAR PERF WALL MOTION  2010   dipyridamole - mild-mod in intenstiy perfusion defect in mid anterior, mid anteroseptal wall, EF 70%  . OVARY SURGERY     bilateral tumors removed  . THYROIDECTOMY    . THYROIDECTOMY  02/2008  .  TRANSTHORACIC ECHOCARDIOGRAM  08/2011   EF=>55%, mild conc LVH; trace MR; mild TR; mild-mod AV calcification with mild valvular AV stenosis  . VAGINAL PROLAPSE REPAIR  04/26/2011   Procedure: VAGINAL VAULT SUSPENSION;  Surgeon: Reece Packer, MD;  Location: [facility];  Service: Urology;  Laterality: N/A;  with Graft  10x6   Allergies  Allergen Reactions  . Senokot Wheat Bran [Wheat Bran]     ABDOMINAL CRAMPS  . Spironolactone     Stomach problems, vision changes    Current Outpatient Medications  Medication Sig    . amLODipine (NORVASC) 10 MG tablet TAKE 1 TABLET(10 MG) BY MOUTH EVERY MORNING    . aspirin 81 MG tablet Take 81 mg by mouth every morning.     . B-D ULTRAFINE III SHORT PEN 31G X 8 MM MISC USE AS DIRECTED WITH INSULIN PENS    . benazepril (LOTENSIN) 40 MG tablet TAKE 1 TABLET(40 MG) BY MOUTH DAILY    . Calcium Carbonate-Vit D-Min (CALCIUM 1200) 1200-1000 MG-UNIT CHEW Chew 1 each by mouth daily.    . ferrous sulfate 325 (65 FE) MG tablet Take 325 mg by mouth daily with breakfast.    . glucose blood (TRUE METRIX BLOOD GLUCOSE TEST) test strip Test BG 4 x daily E11.65    . Insulin Detemir (LEVEMIR FLEXTOUCH) 100 UNIT/ML Pen Inject 70 Units into  the skin at bedtime.    . Insulin Pen Needle (B-D ULTRAFINE III SHORT PEN) 31G X 8 MM MISC USE AS DIRECTED qhs WITH INSULIN PENS    . LANCETS ULTRA FINE MISC 1 each by Does not apply route 4 (four) times daily.    . metFORMIN (GLUCOPHAGE) 500 MG tablet TAKE 1 TABLET(500 MG) BY MOUTH DAILY WITH BREAKFAST    . Multiple Vitamins-Minerals (CENTRUM) tablet Take 1 tablet by mouth every morning.     . Nutritional Supplements (NUTRITIONAL SUPPLEMENT PO) Take by mouth. Neurx-TF (formulation for peripheral nerve health)    . Omega-3 Fatty Acids 1200 MG CAPS Take 1 capsule by mouth every morning.     Marland Kitchen GLYCOLAX/MIRALAX powder Take 17 g by mouth daily.    . pravastatin (PRAVACHOL) 80 MG tablet TAKE 1 TABLET(80 MG) BY MOUTH DAILY    . Travoprost,  BAK Free, (TRAVATAN) 0.004 % SOLN ophthalmic solution Place 1 drop into both eyes at bedtime.     Marland Kitchen MAXZIDE 75-50 MG tablet Take 1 tablet by mouth daily.     Review of Systems PER HPI OTHERWISE ALL SYSTEMS ARE NEGATIVE. GOES TO SENIOR PLACE AND RIDES TREADMILL AND LIFTING WEIGHTS.    Objective:   Physical Exam  Constitutional: She is oriented to person, place, and time. She appears well-developed and well-nourished. No distress.  HENT:  Head: Normocephalic and atraumatic.  Mouth/Throat: Oropharynx is clear and moist. No oropharyngeal exudate.  Eyes: Pupils are equal, round, and reactive to light. No scleral icterus.  Neck: Normal range of motion. Neck supple.  Cardiovascular: Normal rate, regular rhythm and normal heart sounds.  Pulmonary/Chest: Effort normal and breath sounds normal. No respiratory distress.  Abdominal: Soft. Bowel sounds are normal. She exhibits no distension. There is no tenderness.  Musculoskeletal: She exhibits no edema.  Lymphadenopathy:    She has no cervical adenopathy.  Neurological: She is alert and oriented to person, place, and time.  NO  NEW FOCAL DEFICITS  Psychiatric: She has a normal mood and affect.  Vitals reviewed.     Assessment & Plan:

## 2018-01-18 NOTE — Progress Notes (Signed)
ON RECALL  °

## 2018-01-18 NOTE — Progress Notes (Signed)
Joanna Brown     MRN: [id number]      DOB: [birth year]   HPI Joanna Brown is here for follow up and re-evaluation of chronic medical conditions, medication management and review of any available recent lab and radiology data.  Preventive health is updated, specifically   Immunization.   Questions or concerns regarding consultations or procedures which the PT has had in the interim are  addressed. The PT c/o change in bowel movements x 1 year with stringy and pebbly stool also poor appetitie for past 6 to 8 months Blood sugar still elevated with FBG generally over 160  ROS Denies recent fever or chills. Denies sinus pressure, nasal congestion, ear pain or sore throat. Denies chest congestion, productive cough or wheezing. Denies chest pains, palpitations and leg swelling    Denies dysuria, frequency, hesitancy or incontinence. Denies joint pain, swelling and limitation in mobility. Denies headaches, seizures, numbness, or tingling. Denies depression, anxiety or insomnia. Denies skin break down or rash.   PE  BP 138/74   Pulse 74   Resp 16   Ht 5\' 5"  (1.651 m)   Wt 178 lb (80.7 kg)   SpO2 94%   BMI 29.62 kg/m   Patient alert and oriented and in no cardiopulmonary distress.  HEENT: No facial asymmetry, EOMI,   oropharynx pink and moist.  Neck supple no JVD, no mass.  Chest: Clear to auscultation bilaterally.  CVS: S1, S2 systolic murmur, no S3.Regular rate.  ABD: Soft no palpable mass, no localized tenderness, guarding or rebound Rectal; no stool in rectum  Ext: No edema  MS: Adequate ROM spine, shoulders, hips and knees.  Skin: Intact, no ulcerations or rash noted.  Psych: Good eye contact, normal affect. Memory intact not anxious or depressed appearing.  CNS: CN 2-12 intact, power,  normal throughout.no focal deficits noted.   Assessment & Plan  Change in bowel movement 1 year h/o increasing constipation and has noted a change I stool caliber x 6 months,  stringy and balls, had 3 colon polyps in 2015, will refer to GI asap  Essential hypertension Controlled, no change in medication DASH diet and commitment to daily physical activity for a minimum of 30 minutes discussed and encouraged, as a part of hypertension management. The importance of attaining a healthy weight is also discussed.  BP/Weight 01/18/2018 12/25/2017 09/12/2017 08/25/2017 07/19/2017 07/17/2017 10/11/4130  Systolic BP 440 102 725 366 440 347 425  Diastolic BP 74 83 70 74 80 70 78  Wt. (Lbs) 178 179 177.4 179 176 176 -  BMI 29.62 29.79 29.52 29.79 29.29 29.29 30.12       Mixed hyperlipidemia Hyperlipidemia:Low fat diet discussed and encouraged.   Lipid Panel  Lab Results  Component Value Date   CHOL 142 12/05/2017   HDL 38 (L) 12/05/2017   LDLCALC 65 12/05/2017   TRIG 194 (H) 12/05/2017   CHOLHDL 3.7 12/05/2017  needs to reduce fried and fatty foods and increase regular exercise     Type 2 diabetes mellitus with stage 1 chronic kidney disease, with long-term current use of insulin ([place]) Uncontrolled and treated by endo Joanna Brown is reminded of the importance of commitment to daily physical activity for 30 minutes or more, as able and the need to limit carbohydrate intake to 30 to 60 grams per meal to help with blood sugar control.   The need to take medication as prescribed, test blood sugar as directed, and to call between visits if there is  a concern that blood sugar is uncontrolled is also discussed.   Joanna Brown is reminded of the importance of daily foot exam, annual eye examination, and good blood sugar, blood pressure and cholesterol control.  Diabetic Labs Latest Ref Rng & Units 12/05/2017 08/17/2017 07/18/2017 05/04/2017 03/15/2017  HbA1c 4.8 - 5.6 % 9.2(H) 8.1(H) - 9.3(H) -  Microalbumin Not Estab. ug/mL - - 10.3(H) - -  Micro/Creat Ratio 0.0 - 30.0 mg/g creat - - 31.3(H) - -  Chol 0 - 200 mg/dL 142 - - - 171  HDL >40 mg/dL 38(L) - - - 40(L)  Calc LDL 0 -  99 mg/dL 65 - - - 97  Triglycerides <150 mg/dL 194(H) - - - 227(H)  Creatinine 0.44 - 1.00 mg/dL 1.00 1.02(H) - 1.13(H) 1.14(H)   BP/Weight 01/18/2018 01/18/2018 12/25/2017 09/12/2017 08/25/2017 11/16/1636 08/18/3644  Systolic BP 803 212 248 250 037 048 889  Diastolic BP 73 74 83 70 74 80 70  Wt. (Lbs) 178 178 179 177.4 179 176 176  BMI 29.62 29.62 29.79 29.52 29.79 29.29 29.29   Foot/eye exam completion dates Latest Ref Rng & Units 03/23/2017 09/21/2016  Eye Exam No Retinopathy - No Retinopathy  Foot Form Completion - Done -

## 2018-01-18 NOTE — Progress Notes (Signed)
CC'ED TO PCP 

## 2018-01-18 NOTE — Assessment & Plan Note (Signed)
SYMPTOMS NOT IDEALLY CONTROLLED AND MAY BE DUE TO NEW MEDICATION, IRON, AND LOWER FIBER DIET.   DRINK WATER TO KEEP YOUR URINE LIGHT YELLOW. FOLLOW A HIGH FIBER DIET.  AVOID ITEMS THAT CAUSE BLOATING & GAS. SEE INFO BELOW. ADD LINZESS 30 MINS PRIOR TO BREAKFAST. YOU SHOULD HAVE A BM 1-3 HRS AFTER THE DOSE IF NOT TAKE THE LINZESS WITH BREAKFAST. IT CAN CAUSE EXPLOSIVE DIARRHEA.  PT WILL CALL IN 7 DAYS IF SYMPTOMS ARE NOT IMPROVED AND WE WILL INCREASE LINZESS TO 145 MCG DAILY. Stop the iron. RECHECK BLOOD COUNT AND IRON STORES IN JAN 2020 1 WEEK PRIOR TO YOUR NEXT OFFICE VISIT.   FOLLOW UP IN 4 MOS.

## 2018-01-18 NOTE — Patient Instructions (Signed)
F/U mid November, call sooner if needed  You are referred to Dr Kelli Churn for evaluation , her office will call  Start miralax every day this is prescribed and will help with bowel movements  Take stool softeners like colace twice  Every day  Use laxative every 3 days if no bowel movement  Continue to work on your blood sugar  Fasting lipid, cmp and eGFR, cBC, tDSH and vit D  1 week before next appt  It is important that you exercise regularly at least 30 minutes 5 times a week. If you develop chest pain, have severe difficulty breathing, or feel very tired, stop exercising immediately and seek medical attention    Thank you  for choosing [facility]. We consider it a privelige to serve you.  Delivering excellent health care in a caring and  compassionate way is our goal.  Partnering with you,  so that together we can achieve this goal is our strategy.

## 2018-01-18 NOTE — Assessment & Plan Note (Signed)
1 year h/o increasing constipation and has noted a change I stool caliber x 6 months, stringy and balls, had 3 colon polyps in 2015, will refer to GI asap

## 2018-01-18 NOTE — Assessment & Plan Note (Signed)
Controlled, no change in medication DASH diet and commitment to daily physical activity for a minimum of 30 minutes discussed and encouraged, as a part of hypertension management. The importance of attaining a healthy weight is also discussed.  BP/Weight 01/18/2018 12/25/2017 09/12/2017 08/25/2017 07/19/2017 07/17/2017 2/37/9909  Systolic BP 400 050 567 889 338 826 666  Diastolic BP 74 83 70 74 80 70 78  Wt. (Lbs) 178 179 177.4 179 176 176 -  BMI 29.62 29.79 29.52 29.79 29.29 29.29 30.12

## 2018-01-18 NOTE — Telephone Encounter (Signed)
REVIEWED-NO ADDITIONAL RECOMMENDATIONS. 

## 2018-01-20 ENCOUNTER — Encounter: Payer: Self-pay | Admitting: Family Medicine

## 2018-01-20 NOTE — Assessment & Plan Note (Signed)
Hyperlipidemia:Low fat diet discussed and encouraged.   Lipid Panel  Lab Results  Component Value Date   CHOL 142 12/05/2017   HDL 38 (L) 12/05/2017   LDLCALC 65 12/05/2017   TRIG 194 (H) 12/05/2017   CHOLHDL 3.7 12/05/2017  needs to reduce fried and fatty foods and increase regular exercise

## 2018-01-20 NOTE — Assessment & Plan Note (Signed)
Uncontrolled and treated by endo Ms. Butrum is reminded of the importance of commitment to daily physical activity for 30 minutes or more, as able and the need to limit carbohydrate intake to 30 to 60 grams per meal to help with blood sugar control.   The need to take medication as prescribed, test blood sugar as directed, and to call between visits if there is a concern that blood sugar is uncontrolled is also discussed.   Ms. Chappelle is reminded of the importance of daily foot exam, annual eye examination, and good blood sugar, blood pressure and cholesterol control.  Diabetic Labs Latest Ref Rng & Units 12/05/2017 08/17/2017 07/18/2017 05/04/2017 03/15/2017  HbA1c 4.8 - 5.6 % 9.2(H) 8.1(H) - 9.3(H) -  Microalbumin Not Estab. ug/mL - - 10.3(H) - -  Micro/Creat Ratio 0.0 - 30.0 mg/g creat - - 31.3(H) - -  Chol 0 - 200 mg/dL 142 - - - 171  HDL >40 mg/dL 38(L) - - - 40(L)  Calc LDL 0 - 99 mg/dL 65 - - - 97  Triglycerides <150 mg/dL 194(H) - - - 227(H)  Creatinine 0.44 - 1.00 mg/dL 1.00 1.02(H) - 1.13(H) 1.14(H)   BP/Weight 01/18/2018 01/18/2018 12/25/2017 09/12/2017 08/25/2017 0/01/8118 05/20/7827  Systolic BP 562 130 865 784 696 295 284  Diastolic BP 73 74 83 70 74 80 70  Wt. (Lbs) 178 178 179 177.4 179 176 176  BMI 29.62 29.62 29.79 29.52 29.79 29.29 29.29   Foot/eye exam completion dates Latest Ref Rng & Units 03/23/2017 09/21/2016  Eye Exam No Retinopathy - No Retinopathy  Foot Form Completion - Done -

## 2018-01-22 ENCOUNTER — Telehealth: Payer: Self-pay | Admitting: Family Medicine

## 2018-01-22 DIAGNOSIS — H401131 Primary open-angle glaucoma, bilateral, mild stage: Secondary | ICD-10-CM | POA: Diagnosis not present

## 2018-01-22 NOTE — Telephone Encounter (Signed)
rx is making her blood sugar run high.  Please call her

## 2018-01-22 NOTE — Telephone Encounter (Signed)
miralax should NOT make her blood sugar high, if she really thinks that is a problem with her blood sugar or colace and a bigger problem than her constipation and that neither are helping her constipation , then she may stop them, please let her know

## 2018-01-23 NOTE — Telephone Encounter (Signed)
Spoke with patient. She has stopped Miralax and Colace and states that since stopping them her blood sugars have been "much much lower".

## 2018-02-09 ENCOUNTER — Other Ambulatory Visit: Payer: Self-pay

## 2018-02-09 MED ORDER — CVS LANCETS MICRO THIN 33G MISC: 1.0000 | Freq: Four times a day (QID) | 5 refills | Status: DC

## 2018-03-06 DIAGNOSIS — L602 Onychogryphosis: Secondary | ICD-10-CM | POA: Diagnosis not present

## 2018-03-06 DIAGNOSIS — E1351 Other specified diabetes mellitus with diabetic peripheral angiopathy without gangrene: Secondary | ICD-10-CM | POA: Diagnosis not present

## 2018-03-19 ENCOUNTER — Other Ambulatory Visit: Payer: Self-pay | Admitting: "Endocrinology

## 2018-03-19 ENCOUNTER — Telehealth: Payer: Self-pay | Admitting: Gastroenterology

## 2018-03-19 NOTE — Telephone Encounter (Signed)
Pt said the Linzess 72 mcg was not working. Per Dr. Oneida Alar AVS on 01/18/2018 pt would try Linzess 145 mcg if the 72's did not work. I am leaving #12 of the Linzess 145 mcg at front for pt to pick up and take one capsule daily 30 min before breakfast.

## 2018-03-19 NOTE — Telephone Encounter (Signed)
PATIENT CAME IN AND SAID HER LINZESS DOSAGE NEEDS TO BE INCREASED, IT IS NOT WORKING AS GOOD AS IT DID.  [phone]

## 2018-03-19 NOTE — Telephone Encounter (Signed)
REVIEWED-NO ADDITIONAL RECOMMENDATIONS. 

## 2018-03-27 ENCOUNTER — Other Ambulatory Visit (HOSPITAL_COMMUNITY)
Admission: RE | Admit: 2018-03-27 | Discharge: 2018-03-27 | Disposition: A | Discharge status: Home / Self Care | Payer: Medicare Other | Source: Ambulatory Visit | Attending: "Endocrinology | Admitting: "Endocrinology

## 2018-03-27 DIAGNOSIS — Z794 Long term (current) use of insulin: Secondary | ICD-10-CM | POA: Insufficient documentation

## 2018-03-27 DIAGNOSIS — N181 Chronic kidney disease, stage 1: Secondary | ICD-10-CM | POA: Diagnosis not present

## 2018-03-27 DIAGNOSIS — E1122 Type 2 diabetes mellitus with diabetic chronic kidney disease: Secondary | ICD-10-CM | POA: Insufficient documentation

## 2018-03-27 LAB — COMPREHENSIVE METABOLIC PANEL
ALT: 29 U/L (ref 0–44)
AST: 32 U/L (ref 15–41)
Albumin: 4.1 g/dL (ref 3.5–5.0)
Alkaline Phosphatase: 46 U/L (ref 38–126)
Anion gap: 8 (ref 5–15)
BUN: 18 mg/dL (ref 8–23)
CO2: 29 mmol/L (ref 22–32)
Calcium: 9.9 mg/dL (ref 8.9–10.3)
Chloride: 100 mmol/L (ref 98–111)
Creatinine, Ser: 1.03 mg/dL — ABNORMAL HIGH (ref 0.44–1.00)
GFR calc Af Amer: 60 mL/min — ABNORMAL LOW (ref >60)
GFR calc non Af Amer: 51 mL/min — ABNORMAL LOW (ref >60)
Glucose, Bld: 152 mg/dL — ABNORMAL HIGH (ref 70–99)
Potassium: 3.8 mmol/L (ref 3.5–5.1)
Sodium: 137 mmol/L (ref 135–145)
Total Bilirubin: 0.6 mg/dL (ref 0.3–1.2)
Total Protein: 7.9 g/dL (ref 6.5–8.1)

## 2018-03-27 LAB — HEMOGLOBIN A1C
Hgb A1c MFr Bld: 8.5 % — ABNORMAL HIGH (ref 4.8–5.6)
Mean Plasma Glucose: 197.25 mg/dL

## 2018-04-02 ENCOUNTER — Encounter: Payer: Self-pay | Admitting: Family Medicine

## 2018-04-02 ENCOUNTER — Ambulatory Visit (INDEPENDENT_AMBULATORY_CARE_PROVIDER_SITE_OTHER): Payer: Medicare Other | Admitting: Family Medicine

## 2018-04-02 VITALS — BP 118/68 | HR 63 | Resp 16 | Ht 65.0 in | Wt 179.0 lb

## 2018-04-02 DIAGNOSIS — Z794 Long term (current) use of insulin: Secondary | ICD-10-CM | POA: Diagnosis not present

## 2018-04-02 DIAGNOSIS — E782 Mixed hyperlipidemia: Secondary | ICD-10-CM | POA: Diagnosis not present

## 2018-04-02 DIAGNOSIS — E1122 Type 2 diabetes mellitus with diabetic chronic kidney disease: Secondary | ICD-10-CM | POA: Diagnosis not present

## 2018-04-02 DIAGNOSIS — I10 Essential (primary) hypertension: Secondary | ICD-10-CM | POA: Diagnosis not present

## 2018-04-02 DIAGNOSIS — E663 Overweight: Secondary | ICD-10-CM | POA: Diagnosis not present

## 2018-04-02 DIAGNOSIS — E559 Vitamin D deficiency, unspecified: Secondary | ICD-10-CM | POA: Diagnosis not present

## 2018-04-02 DIAGNOSIS — N181 Chronic kidney disease, stage 1: Secondary | ICD-10-CM | POA: Diagnosis not present

## 2018-04-02 NOTE — Assessment & Plan Note (Signed)
Hyperlipidemia:Low fat diet discussed and encouraged.   Lipid Panel  Lab Results  Component Value Date   CHOL 142 12/05/2017   HDL 38 (L) 12/05/2017   LDLCALC 65 12/05/2017   TRIG 194 (H) 12/05/2017   CHOLHDL 3.7 12/05/2017  not at goal Updated lab needed at/ before next visit.

## 2018-04-02 NOTE — Assessment & Plan Note (Signed)
Controlled, no change in medication DASH diet and commitment to daily physical activity for a minimum of 30 minutes discussed and encouraged, as a part of hypertension management. The importance of attaining a healthy weight is also discussed.  BP/Weight 04/02/2018 01/18/2018 01/18/2018 12/25/2017 09/12/2017 4/98/2641 09/20/3092  Systolic BP 076 808 811 031 594 585 929  Diastolic BP 68 73 74 83 70 74 80  Wt. (Lbs) 179 178 178 179 177.4 179 176  BMI 29.79 29.62 29.62 29.79 29.52 29.79 29.29

## 2018-04-02 NOTE — Assessment & Plan Note (Signed)
Uncontrolled and not at goal, followed by Endo Joanna Brown is reminded of the importance of commitment to daily physical activity for 30 minutes or more, as able and the need to limit carbohydrate intake to 30 to 60 grams per meal to help with blood sugar control.   The need to take medication as prescribed, test blood sugar as directed, and to call between visits if there is a concern that blood sugar is uncontrolled is also discussed.   Joanna Brown is reminded of the importance of daily foot exam, annual eye examination, and good blood sugar, blood pressure and cholesterol control.  Diabetic Labs Latest Ref Rng & Units 03/27/2018 12/05/2017 08/17/2017 07/18/2017 05/04/2017  HbA1c 4.8 - 5.6 % 8.5(H) 9.2(H) 8.1(H) - 9.3(H)  Microalbumin Not Estab. ug/mL - - - 10.3(H) -  Micro/Creat Ratio 0.0 - 30.0 mg/g creat - - - 31.3(H) -  Chol 0 - 200 mg/dL - 142 - - -  HDL >40 mg/dL - 38(L) - - -  Calc LDL 0 - 99 mg/dL - 65 - - -  Triglycerides <150 mg/dL - 194(H) - - -  Creatinine 0.44 - 1.00 mg/dL 1.03(H) 1.00 1.02(H) - 1.13(H)   BP/Weight 04/02/2018 01/18/2018 01/18/2018 12/25/2017 09/12/2017 5/69/7948 0/05/6551  Systolic BP 748 270 786 754 492 010 071  Diastolic BP 68 73 74 83 70 74 80  Wt. (Lbs) 179 178 178 179 177.4 179 176  BMI 29.79 29.62 29.62 29.79 29.52 29.79 29.29   Foot/eye exam completion dates Latest Ref Rng & Units 04/02/2018 03/23/2017  Eye Exam No Retinopathy - -  Foot Form Completion - Done Done

## 2018-04-02 NOTE — Progress Notes (Signed)
Joanna Brown     MRN: [id number]      DOB: 1941-02-10   HPI Joanna Brown is here for follow up and re-evaluation of chronic medical conditions, medication management and review of any available recent lab and radiology data.  Preventive health is updated, specifically  Cancer screening and Immunization.   Questions or concerns regarding consultations or procedures which the PT has had in the interim are  addressed.  There are no new concerns.  There are no specific complaints   ROS Denies recent fever or chills. Denies sinus pressure, nasal congestion, ear pain or sore throat. Denies chest congestion, productive cough or wheezing. Denies chest pains, palpitations and leg swelling Denies abdominal pain, nausea, vomiting,diarrhea or constipation.   Denies dysuria, frequency, hesitancy or incontinence. Denies uncontrolled  joint pain, swelling and limitation in mobility. Denies headaches, seizures, numbness, or tingling. Denies depression, anxiety or insomnia. Denies skin break down or rash.   PE  BP 118/68   Pulse 63   Resp 16   Ht 5\' 5"  (1.651 m)   Wt 179 lb (81.2 kg)   SpO2 98%   BMI 29.79 kg/m   Patient alert and oriented and in no cardiopulmonary distress.  HEENT: No facial asymmetry, EOMI,   oropharynx pink and moist.  Neck supple no JVD, no mass.  Chest: Clear to auscultation bilaterally.  CVS: S1, S2 no murmurs, no S3.Regular rate.  ABD: Soft non tender.   Ext: No edema  MS: Adequate ROM spine, shoulders, hips and knees.  Skin: Intact, no ulcerations or rash noted.  Psych: Good eye contact, normal affect. Memory intact not anxious or depressed appearing.  CNS: CN 2-12 intact, power,  normal throughout.no focal deficits noted.   Assessment & Plan  Essential hypertension Controlled, no change in medication DASH diet and commitment to daily physical activity for a minimum of 30 minutes discussed and encouraged, as a part of hypertension  management. The importance of attaining a healthy weight is also discussed.  BP/Weight 04/02/2018 01/18/2018 01/18/2018 12/25/2017 09/12/2017 11/09/348 0/01/3817  Systolic BP 299 371 696 789 381 017 510  Diastolic BP 68 73 74 83 70 74 80  Wt. (Lbs) 179 178 178 179 177.4 179 176  BMI 29.79 29.62 29.62 29.79 29.52 29.79 29.29       Overweight (BMI 25.0-29.9) Unchanged. Patient re-educated about  the importance of commitment to a  minimum of 150 minutes of exercise per week.  The importance of healthy food choices with portion control discussed. Encouraged to start a food diary, count calories and to consider  joining a support group. Sample diet sheets offered. Goals set by the patient for the next several months.   Weight /BMI 04/02/2018 01/18/2018 01/18/2018  WEIGHT 179 lb 178 lb 178 lb  HEIGHT 5\' 5"  5\' 5"  5\' 5"   BMI 29.79 kg/m2 29.62 kg/m2 29.62 kg/m2      Mixed hyperlipidemia Hyperlipidemia:Low fat diet discussed and encouraged.   Lipid Panel  Lab Results  Component Value Date   CHOL 142 12/05/2017   HDL 38 (L) 12/05/2017   LDLCALC 65 12/05/2017   TRIG 194 (H) 12/05/2017   CHOLHDL 3.7 12/05/2017  not at goal Updated lab needed at/ before next visit.      Type 2 diabetes mellitus with stage 1 chronic kidney disease, with long-term current use of insulin ([place]) Uncontrolled and not at goal, followed by Endo Joanna Brown is reminded of the importance of commitment to daily physical activity for 30  minutes or more, as able and the need to limit carbohydrate intake to 30 to 60 grams per meal to help with blood sugar control.   The need to take medication as prescribed, test blood sugar as directed, and to call between visits if there is a concern that blood sugar is uncontrolled is also discussed.   Joanna Brown is reminded of the importance of daily foot exam, annual eye examination, and good blood sugar, blood pressure and cholesterol control.  Diabetic Labs Latest Ref Rng &  Units 03/27/2018 12/05/2017 08/17/2017 07/18/2017 05/04/2017  HbA1c 4.8 - 5.6 % 8.5(H) 9.2(H) 8.1(H) - 9.3(H)  Microalbumin Not Estab. ug/mL - - - 10.3(H) -  Micro/Creat Ratio 0.0 - 30.0 mg/g creat - - - 31.3(H) -  Chol 0 - 200 mg/dL - 142 - - -  HDL >40 mg/dL - 38(L) - - -  Calc LDL 0 - 99 mg/dL - 65 - - -  Triglycerides <150 mg/dL - 194(H) - - -  Creatinine 0.44 - 1.00 mg/dL 1.03(H) 1.00 1.02(H) - 1.13(H)   BP/Weight 04/02/2018 01/18/2018 01/18/2018 12/25/2017 09/12/2017 8/72/1587 06/22/6182  Systolic BP 859 276 394 320 037 944 461  Diastolic BP 68 73 74 83 70 74 80  Wt. (Lbs) 179 178 178 179 177.4 179 176  BMI 29.79 29.62 29.62 29.79 29.52 29.79 29.29   Foot/eye exam completion dates Latest Ref Rng & Units 04/02/2018 03/23/2017  Eye Exam No Retinopathy - -  Foot Form Completion - Done Done

## 2018-04-02 NOTE — Assessment & Plan Note (Signed)
Unchanged. Patient re-educated about  the importance of commitment to a  minimum of 150 minutes of exercise per week.  The importance of healthy food choices with portion control discussed. Encouraged to start a food diary, count calories and to consider  joining a support group. Sample diet sheets offered. Goals set by the patient for the next several months.   Weight /BMI 04/02/2018 01/18/2018 01/18/2018  WEIGHT 179 lb 178 lb 178 lb  HEIGHT 5\' 5"  5\' 5"  5\' 5"   BMI 29.79 kg/m2 29.62 kg/m2 29.62 kg/m2

## 2018-04-02 NOTE — Patient Instructions (Signed)
F/U in 4.5 months, call if you need me before  Please f get fasting lipid, cmp and EGFr, CBC, TSH and vit D next blood work for Dr Dorris Fetch, leave wiith lab order  Discuss colon screening with Dr Oneida Alar when  You next see her

## 2018-04-03 ENCOUNTER — Other Ambulatory Visit: Payer: Self-pay

## 2018-04-03 MED ORDER — UNABLE TO FIND: 0 refills | Status: DC

## 2018-04-04 ENCOUNTER — Encounter: Payer: Self-pay | Admitting: "Endocrinology

## 2018-04-04 ENCOUNTER — Ambulatory Visit (INDEPENDENT_AMBULATORY_CARE_PROVIDER_SITE_OTHER): Payer: Medicare Other | Admitting: "Endocrinology

## 2018-04-04 DIAGNOSIS — E782 Mixed hyperlipidemia: Secondary | ICD-10-CM

## 2018-04-04 DIAGNOSIS — I10 Essential (primary) hypertension: Secondary | ICD-10-CM | POA: Diagnosis not present

## 2018-04-04 DIAGNOSIS — N181 Chronic kidney disease, stage 1: Secondary | ICD-10-CM | POA: Diagnosis not present

## 2018-04-04 DIAGNOSIS — Z794 Long term (current) use of insulin: Secondary | ICD-10-CM | POA: Diagnosis not present

## 2018-04-04 DIAGNOSIS — E1122 Type 2 diabetes mellitus with diabetic chronic kidney disease: Secondary | ICD-10-CM

## 2018-04-04 NOTE — Patient Instructions (Signed)

## 2018-04-04 NOTE — Progress Notes (Signed)
Endocrinology follow-up note   Subjective:    Patient ID: Joanna Brown, female    DOB: April 25, 1941. Patient is being seen  For follow-up for management of diabetes requested by  Fayrene Helper, MD  Past Medical History:  Diagnosis Date  . Blood transfusion    1980  . Diabetes mellitus   . Dysrhythmia   . Female bladder prolapse   . GERD (gastroesophageal reflux disease)   . Heart disease   . Hyperlipidemia   . Hypertension    echo and stress 4/10 reports on chart, EKG ` LOV 9/12 on chart  . Mild aortic stenosis    Past Surgical History:  Procedure Laterality Date  . ABDOMINAL HYSTERECTOMY    . ANTERIOR AND POSTERIOR REPAIR  04/26/2011   Procedure: ANTERIOR (CYSTOCELE) AND POSTERIOR REPAIR (RECTOCELE);  Surgeon: Reece Packer, MD;  Location: [facility];  Service: Urology;  Laterality: N/A;  . CATARACT EXTRACTION Bilateral    with IOL  . CHOLECYSTECTOMY  2009  . COLONOSCOPY N/A 12/02/2013   Procedure: COLONOSCOPY;  Surgeon: Danie Binder, MD;  Location: [facility];  Service: Endoscopy;  Laterality: N/A;  9:30 AM - rescheduled to 8:30 - Joanna Brown to notify pt  . COLONOSCOPY W/ POLYPECTOMY    . LEFT HEART CATH  09/10/2008   normal coronary arteries, normal LV systolic function, EF 16% (Dr. [name])  . LYMPH NODE DISSECTION Right 1997   under arm  . NM MYOCAR PERF WALL MOTION  2010   dipyridamole - mild-mod in intenstiy perfusion defect in mid anterior, mid anteroseptal wall, EF 70%  . OVARY SURGERY     bilateral tumors removed  . THYROIDECTOMY    . THYROIDECTOMY  02/2008  . TRANSTHORACIC ECHOCARDIOGRAM  08/2011   EF=>55%, mild conc LVH; trace MR; mild TR; mild-mod AV calcification with mild valvular AV stenosis  . VAGINAL PROLAPSE REPAIR  04/26/2011   Procedure: VAGINAL VAULT SUSPENSION;  Surgeon: Reece Packer, MD;  Location: [facility];  Service: Urology;  Laterality: N/A;  with Graft  10x6   Social History   Socioeconomic History  . Marital status: Married     Spouse name: Joanna Brown  . Number of children: 1  . Years of education: Trade  . Highest education level: Not on file  Occupational History  . Occupation: Retired  Scientific laboratory technician  . Financial resource strain: Not very hard  . Food insecurity:    Worry: Never true    Inability: Not on file  . Transportation needs:    Medical: No    Non-medical: No  Tobacco Use  . Smoking status: Never Smoker  . Smokeless tobacco: Never Used  Substance and Sexual Activity  . Alcohol use: No    Comment: socially- none x 30 years  . Drug use: No  . Sexual activity: Yes  Lifestyle  . Physical activity:    Days per week: 5 days    Minutes per session: 40 min  . Stress: Not on file  Relationships  . Social connections:    Talks on phone: Not on file    Gets together: Not on file    Attends religious service: Not on file    Active member of club or organization: Not on file    Attends meetings of clubs or organizations: Not on file    Relationship status: Not on file  Other Topics Concern  . Not on file  Social History Narrative   Patient lives at home with spouse.  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 LIVED IN [place] FOR 50 YRS AND CAME BACK TO [place] ~2009. Caffeine Use: 1 cup of coffee daily. HAD ONE CHILD: PASSED 5 YRS. HAVE THREE GRAND-KIDS AND TWO GREAT GRANDS.    Outpatient Encounter Medications as of 04/04/2018  Medication Sig  . amLODipine (NORVASC) 10 MG tablet TAKE 1 TABLET(10 MG) BY MOUTH EVERY MORNING  . aspirin 81 MG tablet Take 81 mg by mouth every morning.   . B-D ULTRAFINE III SHORT PEN 31G X 8 MM MISC USE AS DIRECTED WITH INSULIN PENS  . benazepril (LOTENSIN) 40 MG tablet TAKE 1 TABLET(40 MG) BY MOUTH DAILY  . Calcium Carbonate-Vit D-Min (CALCIUM 1200) 1200-1000 MG-UNIT CHEW Chew 1 each by mouth daily.  . ferrous sulfate 325 (65 FE) MG tablet Take 325 mg by mouth daily with breakfast.  . glucose blood (TRUE METRIX BLOOD GLUCOSE TEST) test strip Test BG 4  x daily E11.65  . Insulin Detemir (LEVEMIR FLEXTOUCH) 100 UNIT/ML Pen Inject 70 Units into the skin at bedtime.  . metFORMIN (GLUCOPHAGE) 500 MG tablet TAKE 1 TABLET(500 MG) BY MOUTH DAILY WITH BREAKFAST  . Multiple Vitamins-Minerals (CENTRUM) tablet Take 1 tablet by mouth every morning.   . Nutritional Supplements (NUTRITIONAL SUPPLEMENT PO) Take by mouth. Neurx-TF (formulation for peripheral nerve health)  . Omega-3 Fatty Acids (FISH OIL) 1200 MG CAPS Take 1 capsule by mouth every morning.   . pravastatin (PRAVACHOL) 80 MG tablet TAKE 1 TABLET(80 MG) BY MOUTH DAILY  . Travoprost, BAK Free, (TRAVATAN) 0.004 % SOLN ophthalmic solution Place 1 drop into both eyes at bedtime.   . triamterene-hydrochlorothiazide (MAXZIDE) 75-50 MG tablet Take 1 tablet by mouth daily.  [place] Kitchen UNABLE TO FIND Diabetic shoes x 1  Inserts x 3  Dx E11.9  . [DISCONTINUED] [employer] LANCETS MICRO THIN 33G MISC 1 each by Does not apply route 4 (four) times daily.  . [DISCONTINUED] Insulin Pen Needle (B-D ULTRAFINE III SHORT PEN) 31G X 8 MM MISC USE AS DIRECTED qhs WITH INSULIN PENS  . [DISCONTINUED] polyethylene glycol powder (GLYCOLAX/MIRALAX) powder Take 17 g by mouth daily. (Patient not taking: Reported on 04/02/2018)   No facility-administered encounter medications on file as of 04/04/2018.    ALLERGIES: Allergies  Allergen Reactions  . Senokot Wheat Bran [Wheat Bran]     ABDOMINAL CRAMPS  . Spironolactone     Stomach problems, vision changes    VACCINATION STATUS: Immunization History  Administered Date(s) Administered  . Pneumococcal Conjugate-13 12/11/2013  . Pneumococcal Polysaccharide-23 01/13/2010  . Tdap 10/05/2010    Diabetes  She presents for her follow-up diabetic visit. She has type 2 diabetes mellitus. Onset time: She was diagnosed at approximate age of 49 years. Her disease course has been improving. There are no hypoglycemic associated symptoms. Pertinent negatives for hypoglycemia include no confusion,  headaches, pallor or seizures. Pertinent negatives for diabetes include no blurred vision, no chest pain, no fatigue, no polydipsia and no polyphagia. There are no hypoglycemic complications. Symptoms are improving. Diabetic complications include nephropathy and retinopathy. Risk factors for coronary artery disease include dyslipidemia, diabetes mellitus, obesity and sedentary lifestyle. Her weight is fluctuating minimally. She is following a generally unhealthy diet. When asked about meal planning, she reported none. She has had a previous visit with a dietitian. She rarely participates in exercise. Her breakfast blood glucose range is generally 140-180 mg/dl. Her bedtime blood glucose range is generally 180-200 mg/dl. Her overall blood glucose range is 180-200 mg/dl.  Eye exam is current.  Hyperlipidemia  This is a chronic problem. The current episode started more than 1 year ago. The problem is uncontrolled. Exacerbating diseases include diabetes and obesity. Pertinent negatives include no chest pain, myalgias or shortness of breath. Current antihyperlipidemic treatment includes statins. Risk factors for coronary artery disease include dyslipidemia, diabetes mellitus, hypertension, obesity, a sedentary lifestyle and post-menopausal.  Hypertension  This is a chronic problem. The current episode started more than 1 year ago. Pertinent negatives include no blurred vision, chest pain, headaches, palpitations or shortness of breath. Risk factors for coronary artery disease include dyslipidemia, diabetes mellitus, obesity and sedentary lifestyle. Hypertensive end-organ damage includes retinopathy.    Review of Systems  Constitutional: Negative for chills, fatigue, fever and unexpected weight change.  HENT: Negative for trouble swallowing and voice change.   Eyes: Negative for blurred vision and visual disturbance.  Respiratory: Negative for cough, shortness of breath and wheezing.   Cardiovascular: Negative  for chest pain, palpitations and leg swelling.  Gastrointestinal: Negative for diarrhea, nausea and vomiting.  Endocrine: Negative for cold intolerance, heat intolerance, polydipsia and polyphagia.  Musculoskeletal: Negative for arthralgias and myalgias.  Skin: Negative for color change, pallor, rash and wound.  Neurological: Negative for seizures and headaches.  Psychiatric/Behavioral: Negative for confusion and suicidal ideas.    Objective:    There were no vitals taken for this visit.  Wt Readings from Last 3 Encounters:  04/02/18 179 lb (81.2 kg)  01/18/18 178 lb (80.7 kg)  01/18/18 178 lb (80.7 kg)    Physical Exam  Constitutional: She is oriented to person, place, and time. She appears well-developed.  HENT:  Head: Normocephalic and atraumatic.  Eyes: Pupils are equal, round, and reactive to light.  Neck: Normal range of motion. Neck supple. No tracheal deviation present. No thyromegaly present.  Cardiovascular: Normal rate.  Pulmonary/Chest: Effort normal.  Abdominal: There is no tenderness. There is no guarding.  Musculoskeletal: Normal range of motion. She exhibits no edema.  Neurological: She is alert and oriented to person, place, and time. No cranial nerve deficit. Coordination normal.  Skin: Skin is warm and dry. No rash noted. No erythema. No pallor.  Psychiatric: She has a normal mood and affect. Judgment normal.    CMP ( most recent) CMP     Component Value Date/Time   NA 137 03/27/2018 0808   K 3.8 03/27/2018 0808   CL 100 03/27/2018 0808   CO2 29 03/27/2018 0808   GLUCOSE 152 (H) 03/27/2018 0808   BUN 18 03/27/2018 0808   CREATININE 1.03 (H) 03/27/2018 0808   CREATININE 1.02 (H) 08/17/2017 0831   CALCIUM 9.9 03/27/2018 0808   PROT 7.9 03/27/2018 0808   ALBUMIN 4.1 03/27/2018 0808   AST 32 03/27/2018 0808   ALT 29 03/27/2018 0808   ALKPHOS 46 03/27/2018 0808   BILITOT 0.6 03/27/2018 0808   GFRNONAA 51 (L) 03/27/2018 0808   GFRNONAA 54 (L)  08/17/2017 0831   GFRAA 60 (L) 03/27/2018 0808   GFRAA 62 08/17/2017 0831     Diabetic Labs (most recent): Lab Results  Component Value Date   HGBA1C 8.5 (H) 03/27/2018   HGBA1C 9.2 (H) 12/05/2017   HGBA1C 8.1 (H) 08/17/2017    Lipid Panel     Component Value Date/Time   CHOL 142 12/05/2017 0832   TRIG 194 (H) 12/05/2017 0832   HDL 38 (L) 12/05/2017 0832   CHOLHDL 3.7 12/05/2017 0832   VLDL 39 12/05/2017 0832   LDLCALC 65  12/05/2017 0832   [place] 97 03/15/2017 0742     Assessment & Plan:   1. Type 2 diabetes mellitus with stage 1 chronic kidney disease, with long-term current use of insulin ([place])  - Patient has currently uncontrolled symptomatic type 2 DM since  77 years of age. - She did bring her logs, showing improving fasting glycemic profile, still above target postprandial readings.  Her A1c is 8.5% improving from 9.2%.     Recent labs reviewed, showing improving renal function.     Her diabetes is complicated by CKD obesity/sedentary life and patient remains at a high risk for more acute and chronic complications of diabetes which include CAD, CVA, CKD, retinopathy, and neuropathy. These are all discussed in detail with the patient.  - I have counseled the patient on diet management and weight loss, by adopting a carbohydrate restricted/protein rich diet.  -She still admits to dietary indiscretions including consumption of sweets and sweetened beverages.  -  Suggestion is made for her to avoid simple carbohydrates  from her diet including Cakes, Sweet Desserts / Pastries, Ice Cream, Soda (diet and regular), Sweet Tea, Candies, Chips, Cookies, Store Bought Juices, Alcohol in Excess of  1-2 drinks a day, Artificial Sweeteners, and "Sugar-free" Products. This will help patient to have stable blood glucose profile and potentially avoid unintended weight gain.  - I encouraged the patient to switch to  unprocessed or minimally processed complex starch and increased protein  intake (animal or plant source), fruits, and vegetables.  - Patient is advised to stick to a routine mealtimes to eat 3 meals  a day and avoid unnecessary snacks ( to snack only to correct hypoglycemia).   - I have approached patient with the following individualized plan to manage diabetes and patient agrees:   -She does not have any more room on her basal insulin.  She is advised to continue Levemir 70 units nightly, associated with strict monitoring of blood glucose 2 times a day-daily before breakfast and at bedtime.    -Patient is encouraged to call clinic for blood glucose levels less than 70 or above 200 mg /dl. -She is advised to continue metformin 500 mg by mouth twice a day, therapeutically suitable for patient. -Patient is not a candidate for  SGLT2 inhibitors due to CKD. -If her A1c increases to above  9% on next visit, she will be considered for  prandial insulin in addition to her basal insulin.  - Patient specific target  A1c;  LDL, HDL, Triglycerides, and  Waist Circumference were discussed in detail.  2) BP/HTN: Her blood pressure is controlled to target.  She is advised to continue her current blood pressure medications including benazepril 40 mg p.o. daily.   3) Lipids/HPL: Her recent lipid panel showed uncontrolled LDL at 97.  She is advised to continue pravastatin 80 mg p.o. Nightly.   4)  Weight/Diet: CDE Consult has been initiated , exercise, and detailed carbohydrates information provided.  5) Chronic Care/Health Maintenance:  -Patient is on ACEI/ARB and Statin medications and encouraged to continue to follow up with Ophthalmology, Podiatrist at least yearly or according to recommendations, and advised to  stay away from smoking. I have recommended yearly flu vaccine and pneumonia vaccination at least every 5 years; moderate intensity exercise for up to 150 minutes weekly; and  sleep for at least 7 hours a day.   - I advised patient to maintain close follow up with  Fayrene Helper, MD for primary care needs.  -  Time spent with the patient: 25 min, of which >50% was spent in reviewing her blood glucose logs , discussing her hypo- and hyper-glycemic episodes, reviewing her current and  previous labs and insulin doses and developing a plan to avoid hypo- and hyper-glycemia. Please refer to Patient Instructions for Blood Glucose Monitoring and Insulin/Medications Dosing Guide"  in media tab for additional information. Joanna Brown participated in the discussions, expressed understanding, and voiced agreement with the above plans.  All questions were answered to her satisfaction. she is encouraged to contact clinic should she have any questions or concerns prior to her return visit.  Follow up plan: - Return in about 3 months (around 07/05/2018) for Meter, and Logs.  Glade Lloyd, MD Phone: [phone]  Fax: [phone]   This note was partially dictated with voice recognition software. Similar sounding words can be transcribed inadequately or may not  be corrected upon review.  04/04/2018, 1:23 PM

## 2018-04-16 ENCOUNTER — Other Ambulatory Visit: Payer: Self-pay | Admitting: "Endocrinology

## 2018-04-27 ENCOUNTER — Encounter: Payer: Self-pay | Admitting: Gastroenterology

## 2018-05-15 DIAGNOSIS — E1351 Other specified diabetes mellitus with diabetic peripheral angiopathy without gangrene: Secondary | ICD-10-CM | POA: Diagnosis not present

## 2018-05-15 DIAGNOSIS — L602 Onychogryphosis: Secondary | ICD-10-CM | POA: Diagnosis not present

## 2018-05-24 DIAGNOSIS — H401131 Primary open-angle glaucoma, bilateral, mild stage: Secondary | ICD-10-CM | POA: Diagnosis not present

## 2018-06-27 DIAGNOSIS — E559 Vitamin D deficiency, unspecified: Secondary | ICD-10-CM | POA: Diagnosis not present

## 2018-06-27 DIAGNOSIS — Z794 Long term (current) use of insulin: Secondary | ICD-10-CM | POA: Diagnosis not present

## 2018-06-27 DIAGNOSIS — N181 Chronic kidney disease, stage 1: Secondary | ICD-10-CM | POA: Diagnosis not present

## 2018-06-27 DIAGNOSIS — E1122 Type 2 diabetes mellitus with diabetic chronic kidney disease: Secondary | ICD-10-CM | POA: Diagnosis not present

## 2018-06-27 DIAGNOSIS — I10 Essential (primary) hypertension: Secondary | ICD-10-CM | POA: Diagnosis not present

## 2018-06-27 DIAGNOSIS — E782 Mixed hyperlipidemia: Secondary | ICD-10-CM | POA: Diagnosis not present

## 2018-06-28 DIAGNOSIS — Z794 Long term (current) use of insulin: Secondary | ICD-10-CM | POA: Diagnosis not present

## 2018-06-28 DIAGNOSIS — E1122 Type 2 diabetes mellitus with diabetic chronic kidney disease: Secondary | ICD-10-CM | POA: Diagnosis not present

## 2018-06-28 DIAGNOSIS — N181 Chronic kidney disease, stage 1: Secondary | ICD-10-CM | POA: Diagnosis not present

## 2018-06-29 LAB — COMPLETE METABOLIC PANEL WITH GFR
AG Ratio: 1.6 (calc) (ref 1.0–2.5)
AG Ratio: 1.3 (calc) (ref 1.0–2.5)
ALT: 25 U/L (ref 6–29)
ALT: 25 U/L (ref 6–29)
AST: 25 U/L (ref 10–35)
AST: 22 U/L (ref 10–35)
Albumin: 4.5 g/dL (ref 3.6–5.1)
Albumin: 4.3 g/dL (ref 3.6–5.1)
Alkaline phosphatase (APISO): 49 U/L (ref 37–153)
Alkaline phosphatase (APISO): 47 U/L (ref 37–153)
BUN/Creatinine Ratio: 14 (calc) (ref 6–22)
BUN/Creatinine Ratio: 15 (calc) (ref 6–22)
BUN: 15 mg/dL (ref 7–25)
BUN: 17 mg/dL (ref 7–25)
CO2: 28 mmol/L (ref 20–32)
CO2: 32 mmol/L (ref 20–32)
Calcium: 10.1 mg/dL (ref 8.6–10.4)
Calcium: 10.2 mg/dL (ref 8.6–10.4)
Chloride: 96 mmol/L — ABNORMAL LOW (ref 98–110)
Chloride: 97 mmol/L — ABNORMAL LOW (ref 98–110)
Creat: 1.04 mg/dL — ABNORMAL HIGH (ref 0.60–0.93)
Creat: 1.12 mg/dL — ABNORMAL HIGH (ref 0.60–0.93)
GFR, Est African American: 60 mL/min/{1.73_m2} (ref >=60)
GFR, Est African American: 55 mL/min/{1.73_m2} — ABNORMAL LOW (ref >=60)
GFR, Est Non African American: 52 mL/min/{1.73_m2} — ABNORMAL LOW (ref >=60)
GFR, Est Non African American: 48 mL/min/{1.73_m2} — ABNORMAL LOW (ref >=60)
Globulin: 2.9 g/dL (calc) (ref 1.9–3.7)
Globulin: 3.2 g/dL (calc) (ref 1.9–3.7)
Glucose, Bld: 241 mg/dL — ABNORMAL HIGH (ref 65–99)
Glucose, Bld: 193 mg/dL — ABNORMAL HIGH (ref 65–99)
Potassium: 4.5 mmol/L (ref 3.5–5.3)
Potassium: 4.3 mmol/L (ref 3.5–5.3)
Sodium: 136 mmol/L (ref 135–146)
Sodium: 137 mmol/L (ref 135–146)
Total Bilirubin: 0.4 mg/dL (ref 0.2–1.2)
Total Bilirubin: 0.4 mg/dL (ref 0.2–1.2)
Total Protein: 7.4 g/dL (ref 6.1–8.1)
Total Protein: 7.5 g/dL (ref 6.1–8.1)

## 2018-06-29 LAB — CBC
HCT: 38.3 % (ref 35.0–45.0)
Hemoglobin: 12.9 g/dL (ref 11.7–15.5)
MCH: 28.9 pg (ref 27.0–33.0)
MCHC: 33.7 g/dL (ref 32.0–36.0)
MCV: 85.9 fL (ref 80.0–100.0)
MPV: 11.3 fL (ref 7.5–12.5)
Platelets: 209 10*3/uL (ref 140–400)
RBC: 4.46 10*6/uL (ref 3.80–5.10)
RDW: 12.8 % (ref 11.0–15.0)
WBC: 5.9 10*3/uL (ref 3.8–10.8)

## 2018-06-29 LAB — TSH
TSH: 3.11 mIU/L (ref 0.40–4.50)
TSH: 3.05 mIU/L (ref 0.40–4.50)

## 2018-06-29 LAB — LIPID PANEL
Cholesterol: 145 mg/dL (ref <200)
HDL: 41 mg/dL — ABNORMAL LOW (ref >=50)
LDL Cholesterol (Calc): 73 mg/dL (calc)
Non-HDL Cholesterol (Calc): 104 mg/dL (calc) (ref <130)
Total CHOL/HDL Ratio: 3.5 (calc) (ref <5.0)
Triglycerides: 225 mg/dL — ABNORMAL HIGH (ref <150)

## 2018-06-29 LAB — MICROALBUMIN / CREATININE URINE RATIO
Creatinine, Urine: 113 mg/dL (ref 20–275)
Microalb Creat Ratio: 102 mcg/mg creat — ABNORMAL HIGH (ref <30)
Microalb, Ur: 11.5 mg/dL

## 2018-06-29 LAB — VITAMIN D 25 HYDROXY (VIT D DEFICIENCY, FRACTURES): Vit D, 25-Hydroxy: 38 ng/mL (ref 30–100)

## 2018-06-29 LAB — T4, FREE: Free T4: 1.3 ng/dL (ref 0.8–1.8)

## 2018-06-29 LAB — HEMOGLOBIN A1C
Hgb A1c MFr Bld: 9.4 % of total Hgb — ABNORMAL HIGH (ref <5.7)
Mean Plasma Glucose: 223 (calc)
eAG (mmol/L): 12.4 (calc)

## 2018-07-05 ENCOUNTER — Encounter: Payer: Self-pay | Admitting: "Endocrinology

## 2018-07-05 ENCOUNTER — Ambulatory Visit (INDEPENDENT_AMBULATORY_CARE_PROVIDER_SITE_OTHER): Payer: Medicare Other | Admitting: "Endocrinology

## 2018-07-05 VITALS — BP 139/79 | HR 76 | Ht 65.0 in | Wt 178.0 lb

## 2018-07-05 DIAGNOSIS — E782 Mixed hyperlipidemia: Secondary | ICD-10-CM | POA: Diagnosis not present

## 2018-07-05 DIAGNOSIS — Z794 Long term (current) use of insulin: Secondary | ICD-10-CM | POA: Diagnosis not present

## 2018-07-05 DIAGNOSIS — N181 Chronic kidney disease, stage 1: Secondary | ICD-10-CM | POA: Diagnosis not present

## 2018-07-05 DIAGNOSIS — E1122 Type 2 diabetes mellitus with diabetic chronic kidney disease: Secondary | ICD-10-CM

## 2018-07-05 DIAGNOSIS — I10 Essential (primary) hypertension: Secondary | ICD-10-CM

## 2018-07-05 MED ORDER — INSULIN DETEMIR 100 UNIT/ML FLEXPEN: 80.0000 [IU] | PEN_INJECTOR | Freq: Every day | SUBCUTANEOUS | 2 refills | Status: DC

## 2018-07-05 NOTE — Patient Instructions (Signed)

## 2018-07-05 NOTE — Progress Notes (Signed)
Endocrinology follow-up note   Subjective:    Patient ID: Joanna Brown, female    DOB: 06-24-1940. Patient is being seen  For follow-up for management of diabetes requested by  Fayrene Helper, MD  Past Medical History:  Diagnosis Date  . Blood transfusion    1980  . Diabetes mellitus   . Dysrhythmia   . Female bladder prolapse   . GERD (gastroesophageal reflux disease)   . Heart disease   . Hyperlipidemia   . Hypertension    echo and stress 4/10 reports on chart, EKG ` LOV 9/12 on chart  . Mild aortic stenosis    Past Surgical History:  Procedure Laterality Date  . ABDOMINAL HYSTERECTOMY    . ANTERIOR AND POSTERIOR REPAIR  04/26/2011   Procedure: ANTERIOR (CYSTOCELE) AND POSTERIOR REPAIR (RECTOCELE);  Surgeon: Reece Packer, MD;  Location: [facility];  Service: Urology;  Laterality: N/A;  . CATARACT EXTRACTION Bilateral    with IOL  . CHOLECYSTECTOMY  2009  . COLONOSCOPY N/A 12/02/2013   Procedure: COLONOSCOPY;  Surgeon: Danie Binder, MD;  Location: [facility];  Service: Endoscopy;  Laterality: N/A;  9:30 AM - rescheduled to 8:30 - Doris to notify pt  . COLONOSCOPY W/ POLYPECTOMY    . LEFT HEART CATH  09/10/2008   normal coronary arteries, normal LV systolic function, EF 69% (Dr. [name])  . LYMPH NODE DISSECTION Right 1997   under arm  . NM MYOCAR PERF WALL MOTION  2010   dipyridamole - mild-mod in intenstiy perfusion defect in mid anterior, mid anteroseptal wall, EF 70%  . OVARY SURGERY     bilateral tumors removed  . THYROIDECTOMY    . THYROIDECTOMY  02/2008  . TRANSTHORACIC ECHOCARDIOGRAM  08/2011   EF=>55%, mild conc LVH; trace MR; mild TR; mild-mod AV calcification with mild valvular AV stenosis  . VAGINAL PROLAPSE REPAIR  04/26/2011   Procedure: VAGINAL VAULT SUSPENSION;  Surgeon: Reece Packer, MD;  Location: [facility];  Service: Urology;  Laterality: N/A;  with Graft  10x6   Social History   Socioeconomic History  . Marital status: Married     Spouse name: [name]  . Number of children: 1  . Years of education: Trade  . Highest education level: Not on file  Occupational History  . Occupation: Retired  Scientific laboratory technician  . Financial resource strain: Not very hard  . Food insecurity:    Worry: Never true    Inability: Not on file  . Transportation needs:    Medical: No    Non-medical: No  Tobacco Use  . Smoking status: Never Smoker  . Smokeless tobacco: Never Used  Substance and Sexual Activity  . Alcohol use: No    Comment: socially- none x 30 years  . Drug use: No  . Sexual activity: Yes  Lifestyle  . Physical activity:    Days per week: 5 days    Minutes per session: 40 min  . Stress: Not on file  Relationships  . Social connections:    Talks on phone: Not on file    Gets together: Not on file    Attends religious service: Not on file    Active member of club or organization: Not on file    Attends meetings of clubs or organizations: Not on file    Relationship status: Not on file  Other Topics Concern  . Not on file  Social History Narrative   Patient lives at home with spouse.  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 LIVED IN [place] FOR 50 YRS AND CAME BACK TO [place] ~2009. Caffeine Use: 1 cup of coffee daily. HAD ONE CHILD: PASSED 5 YRS. HAVE THREE GRAND-KIDS AND TWO GREAT GRANDS.    Outpatient Encounter Medications as of 07/05/2018  Medication Sig  . amLODipine (NORVASC) 10 MG tablet TAKE 1 TABLET(10 MG) BY MOUTH EVERY MORNING  . aspirin 81 MG tablet Take 81 mg by mouth every morning.   . B-D ULTRAFINE III SHORT PEN 31G X 8 MM MISC USE AS DIRECTED WITH INSULIN PENS  . benazepril (LOTENSIN) 40 MG tablet TAKE 1 TABLET(40 MG) BY MOUTH DAILY  . Calcium Carbonate-Vit D-Min (CALCIUM 1200) 1200-1000 MG-UNIT CHEW Chew 1 each by mouth daily.  . ferrous sulfate 325 (65 FE) MG tablet Take 325 mg by mouth daily with breakfast.  . glucose blood (TRUE METRIX BLOOD GLUCOSE TEST) test strip Test BG 4  x daily E11.65  . Insulin Detemir (LEVEMIR FLEXTOUCH) 100 UNIT/ML Pen Inject 80 Units into the skin at bedtime.  . metFORMIN (GLUCOPHAGE) 500 MG tablet TAKE 1 TABLET BY MOUTH EVERY DAY WITH BREAKFAST  . Multiple Vitamins-Minerals (CENTRUM) tablet Take 1 tablet by mouth every morning.   . Nutritional Supplements (NUTRITIONAL SUPPLEMENT PO) Take by mouth. Neurx-TF (formulation for peripheral nerve health)  . Omega-3 Fatty Acids (FISH OIL) 1200 MG CAPS Take 1 capsule by mouth every morning.   . pravastatin (PRAVACHOL) 80 MG tablet TAKE 1 TABLET(80 MG) BY MOUTH DAILY  . Travoprost, BAK Free, (TRAVATAN) 0.004 % SOLN ophthalmic solution Place 1 drop into both eyes at bedtime.   . triamterene-hydrochlorothiazide (MAXZIDE) 75-50 MG tablet Take 1 tablet by mouth daily.  Marland Kitchen UNABLE TO FIND Diabetic shoes x 1  Inserts x 3  Dx E11.9  . [DISCONTINUED] Insulin Detemir (LEVEMIR FLEXTOUCH) 100 UNIT/ML Pen Inject 70 Units into the skin at bedtime.   No facility-administered encounter medications on file as of 07/05/2018.    ALLERGIES: Allergies  Allergen Reactions  . Senokot Wheat Bran [Wheat Bran]     ABDOMINAL CRAMPS  . Spironolactone     Stomach problems, vision changes    VACCINATION STATUS: Immunization History  Administered Date(s) Administered  . Pneumococcal Conjugate-13 12/11/2013  . Pneumococcal Polysaccharide-23 01/13/2010  . Tdap 10/05/2010    Diabetes  She presents for her follow-up diabetic visit. She has type 2 diabetes mellitus. Onset time: She was diagnosed at approximate age of 3 years. Her disease course has been worsening. There are no hypoglycemic associated symptoms. Pertinent negatives for hypoglycemia include no confusion, headaches, pallor or seizures. Associated symptoms include polydipsia and polyuria. Pertinent negatives for diabetes include no blurred vision, no chest pain, no fatigue and no polyphagia. There are no hypoglycemic complications. Symptoms are worsening.  Diabetic complications include nephropathy and retinopathy. Risk factors for coronary artery disease include dyslipidemia, diabetes mellitus, obesity and sedentary lifestyle. Her weight is fluctuating minimally. She is following a generally unhealthy diet. When asked about meal planning, she reported none. She has had a previous visit with a dietitian. She rarely participates in exercise. Her breakfast blood glucose range is generally 180-200 mg/dl. Her bedtime blood glucose range is generally 180-200 mg/dl. Her overall blood glucose range is 180-200 mg/dl. Eye exam is current.  Hyperlipidemia  This is a chronic problem. The current episode started more than 1 year ago. The problem is uncontrolled. Exacerbating diseases include diabetes and obesity. Pertinent negatives include no chest pain, myalgias or shortness of  breath. Current antihyperlipidemic treatment includes statins. Risk factors for coronary artery disease include dyslipidemia, diabetes mellitus, hypertension, obesity, a sedentary lifestyle and post-menopausal.  Hypertension  This is a chronic problem. The current episode started more than 1 year ago. Pertinent negatives include no blurred vision, chest pain, headaches, palpitations or shortness of breath. Risk factors for coronary artery disease include dyslipidemia, diabetes mellitus, obesity and sedentary lifestyle. Hypertensive end-organ damage includes retinopathy.    Review of Systems  Constitutional: Negative for chills, fatigue, fever and unexpected weight change.  HENT: Negative for trouble swallowing and voice change.   Eyes: Negative for blurred vision and visual disturbance.  Respiratory: Negative for cough, shortness of breath and wheezing.   Cardiovascular: Negative for chest pain, palpitations and leg swelling.  Gastrointestinal: Negative for diarrhea, nausea and vomiting.  Endocrine: Positive for polydipsia and polyuria. Negative for cold intolerance, heat intolerance and  polyphagia.  Musculoskeletal: Negative for arthralgias and myalgias.  Skin: Negative for color change, pallor, rash and wound.  Neurological: Negative for seizures and headaches.  Psychiatric/Behavioral: Negative for confusion and suicidal ideas.    Objective:    BP 139/79   Pulse 76   Ht 5\' 5"  (1.651 m)   Wt 178 lb (80.7 kg)   BMI 29.62 kg/m   Wt Readings from Last 3 Encounters:  07/05/18 178 lb (80.7 kg)  04/02/18 179 lb (81.2 kg)  01/18/18 178 lb (80.7 kg)    Physical Exam  Constitutional: She is oriented to person, place, and time. She appears well-developed.  HENT:  Head: Normocephalic and atraumatic.  Eyes: Pupils are equal, round, and reactive to light.  Neck: Normal range of motion. Neck supple. No tracheal deviation present. No thyromegaly present.  Cardiovascular: Normal rate.  Pulmonary/Chest: Effort normal.  Abdominal: There is no abdominal tenderness. There is no guarding.  Musculoskeletal: Normal range of motion.        General: No edema.  Neurological: She is alert and oriented to person, place, and time. No cranial nerve deficit. Coordination normal.  Skin: Skin is warm and dry. No rash noted. No erythema. No pallor.  Psychiatric: She has a normal mood and affect. Judgment normal.    CMP ( most recent) CMP     Component Value Date/Time   NA 137 06/28/2018 0821   K 4.3 06/28/2018 0821   CL 97 (L) 06/28/2018 0821   CO2 32 06/28/2018 0821   GLUCOSE 193 (H) 06/28/2018 0821   BUN 17 06/28/2018 0821   CREATININE 1.12 (H) 06/28/2018 0821   CALCIUM 10.2 06/28/2018 0821   PROT 7.5 06/28/2018 0821   ALBUMIN 4.1 03/27/2018 0808   AST 22 06/28/2018 0821   ALT 25 06/28/2018 0821   ALKPHOS 46 03/27/2018 0808   BILITOT 0.4 06/28/2018 0821   GFRNONAA 48 (L) 06/28/2018 0821   GFRAA 55 (L) 06/28/2018 0821     Diabetic Labs (most recent): Lab Results  Component Value Date   HGBA1C 9.4 (H) 06/28/2018   HGBA1C 8.5 (H) 03/27/2018   HGBA1C 9.2 (H) 12/05/2017     Lipid Panel     Component Value Date/Time   CHOL 145 06/27/2018 0736   TRIG 225 (H) 06/27/2018 0736   HDL 41 (L) 06/27/2018 0736   CHOLHDL 3.5 06/27/2018 0736   VLDL 39 12/05/2017 0832   LDLCALC 73 06/27/2018 0736     Assessment & Plan:   1. Type 2 diabetes mellitus with stage 1 chronic kidney disease, with long-term current use of insulin (HCC)  -  Patient has currently uncontrolled symptomatic type 2 DM since  78 years of age. - She did bring her logs, showing significantly increasing A1c to 9.4% from 8.5%.     Recent labs reviewed, showing improving renal function.     Her diabetes is complicated by CKD obesity/sedentary life and patient remains at a high risk for more acute and chronic complications of diabetes which include CAD, CVA, CKD, retinopathy, and neuropathy. These are all discussed in detail with the patient.  - I have counseled the patient on diet management and weight loss, by adopting a carbohydrate restricted/protein rich diet.  -She still admits to dietary indiscretions including consumption of sweets and sweetened beverages. - Patient admits there is a room for improvement in her diet and drink choices. -  Suggestion is made for her to avoid simple carbohydrates  from her diet including Cakes, Sweet Desserts / Pastries, Ice Cream, Soda (diet and regular), Sweet Tea, Candies, Chips, Cookies, Store Bought Juices, Alcohol in Excess of  1-2 drinks a day, Artificial Sweeteners, and "Sugar-free" Products. This will help patient to have stable blood glucose profile and potentially avoid unintended weight gain.  - I encouraged the patient to switch to  unprocessed or minimally processed complex starch and increased protein intake (animal or plant source), fruits, and vegetables.  - Patient is advised to stick to a routine mealtimes to eat 3 meals  a day and avoid unnecessary snacks ( to snack only to correct hypoglycemia).   - I have approached patient with the  following individualized plan to manage diabetes and patient agrees:   -She does has some room on her basal insulin.  She is advised to increase Levemir to 70 units nightly, associated with strict monitoring of blood glucose 2 times a day-daily before breakfast and at bedtime.    -She is hesitant to add prandial insulin at this time.    -Patient is encouraged to call clinic for blood glucose levels less than 70 or above 200 mg /dl. -She is advised to continue metformin  500 mg by mouth twice a day, therapeutically suitable for patient. -Patient is not a candidate for  SGLT2 inhibitors due to CKD. -If her A1c remains above  9% on next visit, she will be reconsidered for  prandial insulin in addition to her basal insulin.  - Patient specific target  A1c;  LDL, HDL, Triglycerides, and  Waist Circumference were discussed in detail.  2) BP/HTN: Her blood pressure is controlled to target.  She is advised to continue her current blood pressure medications including benazepril 40 mg p.o. daily.   3) Lipids/HPL: Her recent lipid panel showed uncontrolled LDL at 97.  She is advised to continue pravastatin 80 mg p.o. nightly.   4)  Weight/Diet: CDE Consult has been initiated , exercise, and detailed carbohydrates information provided.  5) Chronic Care/Health Maintenance:  -Patient is on ACEI/ARB and Statin medications and encouraged to continue to follow up with Ophthalmology, Podiatrist at least yearly or according to recommendations, and advised to  stay away from smoking. I have recommended yearly flu vaccine and pneumonia vaccination at least every 5 years; moderate intensity exercise for up to 150 minutes weekly; and  sleep for at least 7 hours a day.   - I advised patient to maintain close follow up with Fayrene Helper, MD for primary care needs.  - Time spent with the patient: 25 min, of which >50% was spent in reviewing her blood glucose logs , discussing her hypoglycemia  and  hyperglycemia episodes, reviewing her current and  previous labs / studies and medications  doses and developing a plan to avoid hypoglycemia and hyperglycemia. Please refer to Patient Instructions for Blood Glucose Monitoring and Insulin/Medications Dosing Guide"  in media tab for additional information. Derinda Late Abbasi participated in the discussions, expressed understanding, and voiced agreement with the above plans.  All questions were answered to her satisfaction. she is encouraged to contact clinic should she have any questions or concerns prior to her return visit.   Follow up plan: - Return in about 3 months (around 10/03/2018) for Follow up with Pre-visit Labs, Meter, and Logs.  Glade Lloyd, MD Phone: [phone]  Fax: [phone]   This note was partially dictated with voice recognition software. Similar sounding words can be transcribed inadequately or may not  be corrected upon review.  07/05/2018, 10:18 AM

## 2018-07-17 DIAGNOSIS — E1351 Other specified diabetes mellitus with diabetic peripheral angiopathy without gangrene: Secondary | ICD-10-CM | POA: Diagnosis not present

## 2018-07-17 DIAGNOSIS — L602 Onychogryphosis: Secondary | ICD-10-CM | POA: Diagnosis not present

## 2018-07-23 ENCOUNTER — Ambulatory Visit (INDEPENDENT_AMBULATORY_CARE_PROVIDER_SITE_OTHER): Payer: Medicare Other

## 2018-07-23 VITALS — BP 117/71 | HR 70 | Resp 12 | Ht 65.0 in | Wt 178.0 lb

## 2018-07-23 DIAGNOSIS — Z0000 Encounter for general adult medical examination without abnormal findings: Secondary | ICD-10-CM

## 2018-07-23 NOTE — Progress Notes (Signed)
Subjective:   Joanna Brown is a 78 y.o. female who presents for Medicare Annual (Subsequent) preventive examination.  Review of Systems:   Cardiac Risk Factors include: advanced age (>40men, >29 women);obesity (BMI >30kg/m2);diabetes mellitus;dyslipidemia     Objective:     Vitals: BP 117/71   Pulse 70   Resp 12   Ht 5\' 5"  (1.651 m)   Wt 178 lb (80.7 kg)   SpO2 96% Comment: room air  BMI 29.62 kg/m   Body mass index is 29.62 kg/m.  Advanced Directives 07/23/2018 07/19/2017 07/19/2016 02/17/2016 07/22/2015 07/11/2014 12/02/2013  Does Patient Have a Medical Advance Directive? No No No Yes No No;Yes Patient does not have advance directive;Patient would not like information  Type of Advance Directive - - - - - Living will -  Copy of [place] in Chart? - - - No - copy requested - No - copy requested -  Would patient like information on creating a medical advance directive? No - Patient declined Yes (ED - Information included in AVS) Yes (MAU/Ambulatory/Procedural Areas - Information given) - No - patient declined information - -  Pre-existing out of facility DNR order (yellow form or pink MOST form) - - - - - - No    Tobacco Social History   Tobacco Use  Smoking Status Never Smoker  Smokeless Tobacco Never Used     Counseling given: Not Answered   Clinical Intake:  Pre-visit preparation completed: Yes  Pain : No/denies pain Pain Score: 0-No pain     BMI - recorded: 29.62 Nutritional Status: BMI 25 -29 Overweight Nutritional Risks: None Diabetes: Yes CBG done?: No Did pt. bring in CBG monitor from home?: No  How often do you need to have someone help you when you read instructions, pamphlets, or other written materials from your doctor or pharmacy?: 2 - Rarely What is the last grade level you completed in school?: 12 grade   Interpreter Needed?: No  Information entered by :: Francena Hanly LPN   Past Medical History:  Diagnosis Date  . Blood  transfusion    1980  . Diabetes mellitus   . Dysrhythmia   . Female bladder prolapse   . GERD (gastroesophageal reflux disease)   . Heart disease   . Hyperlipidemia   . Hypertension    echo and stress 4/10 reports on chart, EKG ` LOV 9/12 on chart  . Mild aortic stenosis    Past Surgical History:  Procedure Laterality Date  . ABDOMINAL HYSTERECTOMY    . ANTERIOR AND POSTERIOR REPAIR  04/26/2011   Procedure: ANTERIOR (CYSTOCELE) AND POSTERIOR REPAIR (RECTOCELE);  Surgeon: Reece Packer, MD;  Location: [facility];  Service: Urology;  Laterality: N/A;  . CATARACT EXTRACTION Bilateral    with IOL  . CHOLECYSTECTOMY  2009  . COLONOSCOPY N/A 12/02/2013   Procedure: COLONOSCOPY;  Surgeon: Danie Binder, MD;  Location: [facility];  Service: Endoscopy;  Laterality: N/A;  9:30 AM - rescheduled to 8:30 - Doris to notify pt  . COLONOSCOPY W/ POLYPECTOMY    . LEFT HEART CATH  09/10/2008   normal coronary arteries, normal LV systolic function, EF 76% (Dr. [name])  . LYMPH NODE DISSECTION Right 1997   under arm  . NM MYOCAR PERF WALL MOTION  2010   dipyridamole - mild-mod in intenstiy perfusion defect in mid anterior, mid anteroseptal wall, EF 70%  . OVARY SURGERY     bilateral tumors removed  . THYROIDECTOMY    .  THYROIDECTOMY  02/2008  . TRANSTHORACIC ECHOCARDIOGRAM  08/2011   EF=>55%, mild conc LVH; trace MR; mild TR; mild-mod AV calcification with mild valvular AV stenosis  . VAGINAL PROLAPSE REPAIR  04/26/2011   Procedure: VAGINAL VAULT SUSPENSION;  Surgeon: Reece Packer, MD;  Location: [facility];  Service: Urology;  Laterality: N/A;  with Graft  10x6   Family History  Problem Relation Age of Onset  . Stomach cancer Mother 74  . Heart disease Mother 78       heart disease  . Heart disease Father 46       MI  . Stroke Maternal Grandfather   . Heart attack Paternal Grandfather   . Hypertension Brother   . Bone cancer Brother   . Hypertension Sister   . Liver cancer  Sister   . Hypertension Sister   . Hypertension Child    Social History   Socioeconomic History  . Marital status: Married    Spouse name: [name]  . Number of children: 1  . Years of education: Trade  . Highest education level: 12th grade  Occupational History  . Occupation: Retired  Scientific laboratory technician  . Financial resource strain: Not very hard  . Food insecurity:    Worry: Never true    Inability: Never true  . Transportation needs:    Medical: No    Non-medical: No  Tobacco Use  . Smoking status: Never Smoker  . Smokeless tobacco: Never Used  Substance and Sexual Activity  . Alcohol use: No    Comment: socially- none x 30 years  . Drug use: No  . Sexual activity: Yes  Lifestyle  . Physical activity:    Days per week: 3 days    Minutes per session: 40 min  . Stress: Only a little  Relationships  . Social connections:    Talks on phone: Twice a week    Gets together: Twice a week    Attends religious service: More than 4 times per year    Active member of club or organization: Yes    Attends meetings of clubs or organizations: More than 4 times per year    Relationship status: Married  Other Topics Concern  . Not on file  Social History Narrative   Patient lives at home with spouse. 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 LIVED IN [place] FOR 50 YRS AND CAME BACK TO [place] ~2009. Caffeine Use: 1 cup of coffee daily. HAD ONE CHILD: PASSED 5 YRS. HAVE THREE GRAND-KIDS AND TWO GREAT GRANDS.     Outpatient Encounter Medications as of 07/23/2018  Medication Sig  . amLODipine (NORVASC) 10 MG tablet TAKE 1 TABLET(10 MG) BY MOUTH EVERY MORNING  . aspirin 81 MG tablet Take 81 mg by mouth every morning.   . B-D ULTRAFINE III SHORT PEN 31G X 8 MM MISC USE AS DIRECTED WITH INSULIN PENS  . benazepril (LOTENSIN) 40 MG tablet TAKE 1 TABLET(40 MG) BY MOUTH DAILY  . Calcium Carbonate-Vit D-Min (CALCIUM 1200) 1200-1000 MG-UNIT CHEW Chew 1 each by mouth daily.  .  ferrous sulfate 325 (65 FE) MG tablet Take 325 mg by mouth daily with breakfast.  . glucose blood (TRUE METRIX BLOOD GLUCOSE TEST) test strip Test BG 4 x daily E11.65  . Insulin Detemir (LEVEMIR FLEXTOUCH) 100 UNIT/ML Pen Inject 80 Units into the skin at bedtime.  . metFORMIN (GLUCOPHAGE) 500 MG tablet TAKE 1 TABLET BY MOUTH EVERY DAY WITH BREAKFAST  . Multiple Vitamins-Minerals (CENTRUM)  tablet Take 1 tablet by mouth every morning.   . Nutritional Supplements (NUTRITIONAL SUPPLEMENT PO) Take by mouth. Neurx-TF (formulation for peripheral nerve health)  . Omega-3 Fatty Acids (FISH OIL) 1200 MG CAPS Take 1 capsule by mouth every morning.   . pravastatin (PRAVACHOL) 80 MG tablet TAKE 1 TABLET(80 MG) BY MOUTH DAILY  . Travoprost, BAK Free, (TRAVATAN) 0.004 % SOLN ophthalmic solution Place 1 drop into both eyes at bedtime.   . triamterene-hydrochlorothiazide (MAXZIDE) 75-50 MG tablet Take 1 tablet by mouth daily.  Marland Kitchen UNABLE TO FIND Diabetic shoes x 1  Inserts x 3  Dx E11.9   No facility-administered encounter medications on file as of 07/23/2018.     Activities of Daily Living In your present state of health, do you have any difficulty performing the following activities: 07/23/2018  Hearing? N  Vision? Y  Difficulty concentrating or making decisions? Y  Walking or climbing stairs? N  Dressing or bathing? N  Doing errands, shopping? N  Preparing Food and eating ? N  Using the Toilet? N  In the past six months, have you accidently leaked urine? N  Do you have problems with loss of bowel control? N  Managing your Medications? N  Managing your Finances? N  Housekeeping or managing your Housekeeping? N  Some recent data might be hidden    Patient Care Team: Fayrene Helper, MD as PCP - General Hilty, Nadean Corwin, MD as Attending Physician (Cardiology) Rutherford Guys, MD as Attending Physician (Ophthalmology) Arlana Lindau, RD as Dietitian (Nutrition)    Assessment:   This is a  routine wellness examination for Sunday.  Exercise Activities and Dietary recommendations Current Exercise Habits: Structured exercise class, Type of exercise: treadmill, Time (Minutes): 35, Frequency (Times/Week): 4, Weekly Exercise (Minutes/Week): 140, Intensity: Moderate  Goals    . DIET - INCREASE WATER INTAKE    . HEMOGLOBIN A1C < 7.0     A1C 8.7 07/05/2016   Will continue see nutritionist  Will work with pcp to manage medications        Fall Risk Fall Risk  07/23/2018 04/02/2018 08/25/2017 07/19/2017 05/11/2017  Falls in the past year? 0 0 No No No  Number falls in past yr: 0 - - - -  Injury with Fall? 0 - - - -  Risk for fall due to : Impaired vision - - - -  Follow up Falls prevention discussed - - - -   Is the patient's home free of loose throw rugs in walkways, pet beds, electrical cords, etc?   yes      Grab bars in the bathroom? yes      Handrails on the stairs?   yes      Adequate lighting?   yes  Timed Get Up and Go performed: Patient able to perform in 6 seconds without assistance   Depression Screen PHQ 2/9 Scores 07/23/2018 04/02/2018 01/18/2018 08/25/2017  PHQ - 2 Score 3 1 0 0  PHQ- 9 Score 7 3 2  -     Cognitive Function MMSE - Mini Mental State Exam 07/23/2014  Orientation to time 5  Orientation to Place 5  Registration 3  Attention/ Calculation 4  Recall 1  Language- name 2 objects 2  Language- repeat 1  Language- follow 3 step command 3  Language- read & follow direction 1  Write a sentence 1  Copy design 1  Total score 27     6CIT Screen 07/23/2018 07/19/2017  What Year? 0 points  0 points  What month? 0 points -  What time? 0 points 0 points  Count back from 20 0 points -  Months in reverse 0 points 0 points  Repeat phrase 0 points 0 points  Total Score 0 -    Immunization History  Administered Date(s) Administered  . Pneumococcal Conjugate-13 12/11/2013  . Pneumococcal Polysaccharide-23 01/13/2010  . Tdap 10/05/2010    Qualifies for Shingles  Vaccine?refused   Screening Tests Health Maintenance  Topic Date Due  . INFLUENZA VACCINE  09/07/2018 (Originally 12/14/2017)  . OPHTHALMOLOGY EXAM  09/25/2018 (Originally 07/19/2018)  . HEMOGLOBIN A1C  12/27/2018  . FOOT EXAM  04/03/2019  . TETANUS/TDAP  10/04/2020  . DEXA SCAN  Completed  . PNA vac Low Risk Adult  Completed    Cancer Screenings: Lung: Low Dose CT Chest recommended if Age 90-80 years, 30 pack-year currently smoking OR have quit w/in 15years. Patient does not qualify. Breast:  Up to date on Mammogram? Yes   Up to date of Bone Density/Dexa? Yes Colorectal: complete   Additional Screenings:  Hepatitis C Screening: Needed      Plan:   Continue to exercise and work on lowering A1C, increase water intake   I have personally reviewed and noted the following in the patient's chart:   . Medical and social history . Use of alcohol, tobacco or illicit drugs  . Current medications and supplements . Functional ability and status . Nutritional status . Physical activity . Advanced directives . List of other physicians . Hospitalizations, surgeries, and ER visits in previous 12 months . Vitals . Screenings to include cognitive, depression, and falls . Referrals and appointments  In addition, I have reviewed and discussed with patient certain preventive protocols, quality metrics, and best practice recommendations. A written personalized care plan for preventive services as well as general preventive health recommendations were provided to patient.     Hayden Pedro, LPN  [id number]/[id number]

## 2018-07-23 NOTE — Patient Instructions (Signed)
Joanna Brown , Thank you for taking time to come for your Medicare Wellness Visit. I appreciate your ongoing commitment to your health goals. Please review the following plan we discussed and let me know if I can assist you in the future.   Screening recommendations/referrals: Colonoscopy: done 2015 Mammogram: done 2019 Bone Density: complete  Recommended yearly ophthalmology/optometry visit for glaucoma screening and checkup Recommended yearly dental visit for hygiene and checkup  Vaccinations: Influenza vaccine: patient refused  Pneumococcal vaccine: up to date  Tdap vaccine: up to date  Shingles vaccine: patient refused    Advanced directives: patient refused   Conditions/risks identified: diabetes Type 2, hypertension, IBS  Next appointment: Wellness visit in one year    Preventive Care 36 Years and Older, Female Preventive care refers to lifestyle choices and visits with your health care provider that can promote health and wellness. What does preventive care include?  A yearly physical exam. This is also called an annual well check.  Dental exams once or twice a year.  Routine eye exams. Ask your health care provider how often you should have your eyes checked.  Personal lifestyle choices, including:  Daily care of your teeth and gums.  Regular physical activity.  Eating a healthy diet.  Avoiding tobacco and drug use.  Limiting alcohol use.  Practicing safe sex.  Taking low-dose aspirin every day.  Taking vitamin and mineral supplements as recommended by your health care provider. What happens during an annual well check? The services and screenings done by your health care provider during your annual well check will depend on your age, overall health, lifestyle risk factors, and family history of disease. Counseling  Your health care provider may ask you questions about your:  Alcohol use.  Tobacco use.  Drug use.  Emotional well-being.  Home and  relationship well-being.  Sexual activity.  Eating habits.  History of falls.  Memory and ability to understand (cognition).  Work and work Statistician.  Reproductive health. Screening  You may have the following tests or measurements:  Height, weight, and BMI.  Blood pressure.  Lipid and cholesterol levels. These may be checked every 5 years, or more frequently if you are over 77 years old.  Skin check.  Lung cancer screening. You may have this screening every year starting at age 47 if you have a 30-pack-year history of smoking and currently smoke or have quit within the past 15 years.  Fecal occult blood test (FOBT) of the stool. You may have this test every year starting at age 24.  Flexible sigmoidoscopy or colonoscopy. You may have a sigmoidoscopy every 5 years or a colonoscopy every 10 years starting at age 75.  Hepatitis C blood test.  Hepatitis B blood test.  Sexually transmitted disease (STD) testing.  Diabetes screening. This is done by checking your blood sugar (glucose) after you have not eaten for a while (fasting). You may have this done every 1-3 years.  Bone density scan. This is done to screen for osteoporosis. You may have this done starting at age 90.  Mammogram. This may be done every 1-2 years. Talk to your health care provider about how often you should have regular mammograms. Talk with your health care provider about your test results, treatment options, and if necessary, the need for more tests. Vaccines  Your health care provider may recommend certain vaccines, such as:  Influenza vaccine. This is recommended every year.  Tetanus, diphtheria, and acellular pertussis (Tdap, Td) vaccine. You may need  a Td booster every 10 years.  Zoster vaccine. You may need this after age 28.  Pneumococcal 13-valent conjugate (PCV13) vaccine. One dose is recommended after age 56.  Pneumococcal polysaccharide (PPSV23) vaccine. One dose is recommended after  age 73. Talk to your health care provider about which screenings and vaccines you need and how often you need them. This information is not intended to replace advice given to you by your health care provider. Make sure you discuss any questions you have with your health care provider. Document Released: 05/29/2015 Document Revised: 01/20/2016 Document Reviewed: 03/03/2015 Elsevier Interactive Patient Education  2017 [employer] Prevention in the Home Falls can cause injuries. They can happen to people of all ages. There are many things you can do to make your home safe and to help prevent falls. What can I do on the outside of my home?  Regularly fix the edges of walkways and driveways and fix any cracks.  Remove anything that might make you trip as you walk through a door, such as a raised step or threshold.  Trim any bushes or trees on the path to your home.  Use bright outdoor lighting.  Clear any walking paths of anything that might make someone trip, such as rocks or tools.  Regularly check to see if handrails are loose or broken. Make sure that both sides of any steps have handrails.  Any raised decks and porches should have guardrails on the edges.  Have any leaves, snow, or ice cleared regularly.  Use sand or salt on walking paths during winter.  Clean up any spills in your garage right away. This includes oil or grease spills. What can I do in the bathroom?  Use night lights.  Install grab bars by the toilet and in the tub and shower. Do not use towel bars as grab bars.  Use non-skid mats or decals in the tub or shower.  If you need to sit down in the shower, use a plastic, non-slip stool.  Keep the floor dry. Clean up any water that spills on the floor as soon as it happens.  Remove soap buildup in the tub or shower regularly.  Attach bath mats securely with double-sided non-slip rug tape.  Do not have throw rugs and other things on the floor that can  make you trip. What can I do in the bedroom?  Use night lights.  Make sure that you have a light by your bed that is easy to reach.  Do not use any sheets or blankets that are too big for your bed. They should not hang down onto the floor.  Have a firm chair that has side arms. You can use this for support while you get dressed.  Do not have throw rugs and other things on the floor that can make you trip. What can I do in the kitchen?  Clean up any spills right away.  Avoid walking on wet floors.  Keep items that you use a lot in easy-to-reach places.  If you need to reach something above you, use a strong step stool that has a grab bar.  Keep electrical cords out of the way.  Do not use floor polish or wax that makes floors slippery. If you must use wax, use non-skid floor wax.  Do not have throw rugs and other things on the floor that can make you trip. What can I do with my stairs?  Do not leave any items on the  stairs.  Make sure that there are handrails on both sides of the stairs and use them. Fix handrails that are broken or loose. Make sure that handrails are as long as the stairways.  Check any carpeting to make sure that it is firmly attached to the stairs. Fix any carpet that is loose or worn.  Avoid having throw rugs at the top or bottom of the stairs. If you do have throw rugs, attach them to the floor with carpet tape.  Make sure that you have a light switch at the top of the stairs and the bottom of the stairs. If you do not have them, ask someone to add them for you. What else can I do to help prevent falls?  Wear shoes that:  Do not have high heels.  Have rubber bottoms.  Are comfortable and fit you well.  Are closed at the toe. Do not wear sandals.  If you use a stepladder:  Make sure that it is fully opened. Do not climb a closed stepladder.  Make sure that both sides of the stepladder are locked into place.  Ask someone to hold it for you,  if possible.  Clearly mark and make sure that you can see:  Any grab bars or handrails.  First and last steps.  Where the edge of each step is.  Use tools that help you move around (mobility aids) if they are needed. These include:  Canes.  Walkers.  Scooters.  Crutches.  Turn on the lights when you go into a dark area. Replace any light bulbs as soon as they burn out.  Set up your furniture so you have a clear path. Avoid moving your furniture around.  If any of your floors are uneven, fix them.  If there are any pets around you, be aware of where they are.  Review your medicines with your doctor. Some medicines can make you feel dizzy. This can increase your chance of falling. Ask your doctor what other things that you can do to help prevent falls. This information is not intended to replace advice given to you by your health care provider. Make sure you discuss any questions you have with your health care provider. Document Released: 02/26/2009 Document Revised: 10/08/2015 Document Reviewed: 06/06/2014 Elsevier Interactive Patient Education  2017 [employer].

## 2018-08-01 ENCOUNTER — Encounter: Payer: Self-pay | Admitting: Family Medicine

## 2018-08-01 ENCOUNTER — Other Ambulatory Visit: Payer: Self-pay

## 2018-08-01 ENCOUNTER — Ambulatory Visit (INDEPENDENT_AMBULATORY_CARE_PROVIDER_SITE_OTHER): Payer: Medicare Other | Admitting: Family Medicine

## 2018-08-01 VITALS — BP 120/70 | HR 74 | Resp 15 | Ht 65.0 in | Wt 179.0 lb

## 2018-08-01 DIAGNOSIS — E663 Overweight: Secondary | ICD-10-CM | POA: Diagnosis not present

## 2018-08-01 DIAGNOSIS — M79675 Pain in left toe(s): Secondary | ICD-10-CM | POA: Diagnosis not present

## 2018-08-01 DIAGNOSIS — E782 Mixed hyperlipidemia: Secondary | ICD-10-CM | POA: Diagnosis not present

## 2018-08-01 DIAGNOSIS — E1122 Type 2 diabetes mellitus with diabetic chronic kidney disease: Secondary | ICD-10-CM | POA: Diagnosis not present

## 2018-08-01 DIAGNOSIS — I10 Essential (primary) hypertension: Secondary | ICD-10-CM

## 2018-08-01 DIAGNOSIS — Z1231 Encounter for screening mammogram for malignant neoplasm of breast: Secondary | ICD-10-CM

## 2018-08-01 DIAGNOSIS — N181 Chronic kidney disease, stage 1: Secondary | ICD-10-CM

## 2018-08-01 DIAGNOSIS — Z794 Long term (current) use of insulin: Secondary | ICD-10-CM

## 2018-08-01 NOTE — Assessment & Plan Note (Signed)
Deteriorated.  Patient re-educated about  the importance of commitment to a  minimum of 150 minutes of exercise per week as able.  The importance of healthy food choices with portion control discussed, as well as eating regularly and within a 12 hour window most days. The need to choose "clean , green" food 50 to 75% of the time is discussed, as well as to make water the primary drink and set a goal of 64 ounces water daily.  Encouraged to start a food diary,  and to consider  joining a support group. Sample diet sheets offered. Goals set by the patient for the next several months.   Weight /BMI 08/01/2018 07/23/2018 07/05/2018  WEIGHT 179 lb 178 lb 178 lb  HEIGHT 5\' 5"  5\' 5"  5\' 5"   BMI 29.79 kg/m2 29.62 kg/m2 29.62 kg/m2

## 2018-08-01 NOTE — Assessment & Plan Note (Signed)
Deteriorated due to non compliance with diet Is changing this, and is followed by Endo Joanna Brown is reminded of the importance of commitment to daily physical activity for 30 minutes or more, as able and the need to limit carbohydrate intake to 30 to 60 grams per meal to help with blood sugar control.   The need to take medication as prescribed, test blood sugar as directed, and to call between visits if there is a concern that blood sugar is uncontrolled is also discussed.   Joanna Brown is reminded of the importance of daily foot exam, annual eye examination, and good blood sugar, blood pressure and cholesterol control.  Diabetic Labs Latest Ref Rng & Units 06/28/2018 06/27/2018 03/27/2018 12/05/2017 08/17/2017  HbA1c <5.7 % of total Hgb 9.4(H) - 8.5(H) 9.2(H) 8.1(H)  Microalbumin mg/dL 11.5 - - - -  Micro/Creat Ratio <30 mcg/mg creat 102(H) - - - -  Chol <200 mg/dL - 145 - 142 -  HDL > OR = 50 mg/dL - 41(L) - 38(L) -  Calc LDL mg/dL (calc) - 73 - 65 -  Triglycerides <150 mg/dL - 225(H) - 194(H) -  Creatinine 0.60 - 0.93 mg/dL 1.12(H) 1.04(H) 1.03(H) 1.00 1.02(H)   BP/Weight 08/01/2018 07/23/2018 07/05/2018 04/02/2018 01/18/2018 01/18/2018 1/61/0960  Systolic BP 454 098 119 147 829 562 130  Diastolic BP 70 71 79 68 73 74 83  Wt. (Lbs) 179 178 178 179 178 178 179  BMI 29.79 29.62 29.62 29.79 29.62 29.62 29.79   Foot/eye exam completion dates Latest Ref Rng & Units 04/02/2018 03/23/2017  Eye Exam No Retinopathy - -  Foot Form Completion - Done Done

## 2018-08-01 NOTE — Assessment & Plan Note (Signed)
UnControlled, no change in medication , needs to reduce fat intake and increase exercise commitment Hyperlipidemia:Low fat diet discussed and encouraged.   Lipid Panel  Lab Results  Component Value Date   CHOL 145 06/27/2018   HDL 41 (L) 06/27/2018   LDLCALC 73 06/27/2018   TRIG 225 (H) 06/27/2018   CHOLHDL 3.5 06/27/2018

## 2018-08-01 NOTE — Assessment & Plan Note (Signed)
Improved pain following trauma when she had her nail cut by Podiatry. On exam, no infection present. I advised her to let MD at office know her experience , stated that new person had cut her nail and she had no problem prior to this for the 2 years she had been going for

## 2018-08-01 NOTE — Assessment & Plan Note (Signed)
Controlled, no change in medication DASH diet and commitment to daily physical activity for a minimum of 30 minutes discussed and encouraged, as a part of hypertension management. The importance of attaining a healthy weight is also discussed.  BP/Weight 08/01/2018 07/23/2018 07/05/2018 04/02/2018 01/18/2018 01/18/2018 10/13/1038  Systolic BP 459 136 859 923 414 436 016  Diastolic BP 70 71 79 68 73 74 83  Wt. (Lbs) 179 178 178 179 178 178 179  BMI 29.79 29.62 29.62 29.79 29.62 29.62 29.79

## 2018-08-01 NOTE — Progress Notes (Signed)
Joanna Brown     MRN: [id number]      DOB: [birth year]   HPI Joanna Brown is here for follow up and re-evaluation of chronic medical conditions, medication management and review of any available recent lab and radiology data.  Preventive health is updated, specifically  Cancer screening and Immunization.   Questions or concerns regarding consultations or procedures which the PT has had in the interim are  addressed. The PT denies any adverse reactions to current medications since the last visit.  C/o uncontrolled blood sugar as she has increased intake of sweets and carbs C/o pain on inner left  great toe since had her nail clipped at Podiatry on 07/15/2018. Never had purulent drainage, just cleans with alcohol  ROS Denies recent fever or chills. Denies sinus pressure, nasal congestion, ear pain or sore throat. Denies chest congestion, productive cough or wheezing. Denies chest pains, palpitations and leg swelling Denies abdominal pain, nausea, vomiting,diarrhea or constipation.   Denies dysuria, frequency, hesitancy or incontinence. Denies joint pain, swelling and limitation in mobility. Denies headaches, seizures, numbness, or tingling. Denies depression, anxiety or insomnia.    PE  BP 120/70   Pulse 74   Resp 15   Ht 5\' 5"  (1.651 m)   Wt 179 lb (81.2 kg)   SpO2 99%   BMI 29.79 kg/m   Patient alert and oriented and in no cardiopulmonary distress.  HEENT: No facial asymmetry, EOMI,   oropharynx pink and moist.  Neck supple no JVD, no mass.  Chest: Clear to auscultation bilaterally.  CVS: S1, S2 no murmurs, no S3.Regular rate.  ABD: Soft non tender.   Ext: No edema  MS: Adequate ROM spine, shoulders, hips and knees.  Skin: Intact, no ulcerations or rash noted.left  great toenail partially trimmed from nailbed, no erythema or drainage at site  Psych: Good eye contact, normal affect. Memory intact not anxious or depressed appearing.  CNS: CN 2-12 intact, power,   normal throughout.no focal deficits noted.   Assessment & Plan Essential hypertension Controlled, no change in medication DASH diet and commitment to daily physical activity for a minimum of 30 minutes discussed and encouraged, as a part of hypertension management. The importance of attaining a healthy weight is also discussed.  BP/Weight 08/01/2018 07/23/2018 07/05/2018 04/02/2018 01/18/2018 01/18/2018 2/99/3716  Systolic BP 967 893 810 175 102 585 277  Diastolic BP 70 71 79 68 73 74 83  Wt. (Lbs) 179 178 178 179 178 178 179  BMI 29.79 29.62 29.62 29.79 29.62 29.62 29.79       Mixed hyperlipidemia UnControlled, no change in medication , needs to reduce fat intake and increase exercise commitment Hyperlipidemia:Low fat diet discussed and encouraged.   Lipid Panel  Lab Results  Component Value Date   CHOL 145 06/27/2018   HDL 41 (L) 06/27/2018   LDLCALC 73 06/27/2018   TRIG 225 (H) 06/27/2018   CHOLHDL 3.5 06/27/2018       Class 1 obesity due to excess calories with serious comorbidity and body mass index (BMI) of 30.0 to 30.9 in adult Deteriorated.  Patient re-educated about  the importance of commitment to a  minimum of 150 minutes of exercise per week as able.  The importance of healthy food choices with portion control discussed, as well as eating regularly and within a 12 hour window most days. The need to choose "clean , green" food 50 to 75% of the time is discussed, as well as to make water the  primary drink and set a goal of 64 ounces water daily.  Encouraged to start a food diary,  and to consider  joining a support group. Sample diet sheets offered. Goals set by the patient for the next several months.   Weight /BMI 08/01/2018 07/23/2018 07/05/2018  WEIGHT 179 lb 178 lb 178 lb  HEIGHT 5\' 5"  5\' 5"  5\' 5"   BMI 29.79 kg/m2 29.62 kg/m2 29.62 kg/m2      Type 2 diabetes mellitus with stage 1 chronic kidney disease, with long-term current use of insulin ([place])  Deteriorated due to non compliance with diet Is changing this, and is followed by Endo Ms. Gross is reminded of the importance of commitment to daily physical activity for 30 minutes or more, as able and the need to limit carbohydrate intake to 30 to 60 grams per meal to help with blood sugar control.   The need to take medication as prescribed, test blood sugar as directed, and to call between visits if there is a concern that blood sugar is uncontrolled is also discussed.   Ms. Bellino is reminded of the importance of daily foot exam, annual eye examination, and good blood sugar, blood pressure and cholesterol control.  Diabetic Labs Latest Ref Rng & Units 06/28/2018 06/27/2018 03/27/2018 12/05/2017 08/17/2017  HbA1c <5.7 % of total Hgb 9.4(H) - 8.5(H) 9.2(H) 8.1(H)  Microalbumin mg/dL 11.5 - - - -  Micro/Creat Ratio <30 mcg/mg creat 102(H) - - - -  Chol <200 mg/dL - 145 - 142 -  HDL > OR = 50 mg/dL - 41(L) - 38(L) -  Calc LDL mg/dL (calc) - 73 - 65 -  Triglycerides <150 mg/dL - 225(H) - 194(H) -  Creatinine 0.60 - 0.93 mg/dL 1.12(H) 1.04(H) 1.03(H) 1.00 1.02(H)   BP/Weight 08/01/2018 07/23/2018 07/05/2018 04/02/2018 01/18/2018 01/18/2018 1/66/0630  Systolic BP 160 109 323 557 322 025 427  Diastolic BP 70 71 79 68 73 74 83  Wt. (Lbs) 179 178 178 179 178 178 179  BMI 29.79 29.62 29.62 29.79 29.62 29.62 29.79   Foot/eye exam completion dates Latest Ref Rng & Units 04/02/2018 03/23/2017  Eye Exam No Retinopathy - -  Foot Form Completion - Done Done        Overweight (BMI 25.0-29.9) Deteriorated.  Patient re-educated about  the importance of commitment to a  minimum of 150 minutes of exercise per week as able.  The importance of healthy food choices with portion control discussed, as well as eating regularly and within a 12 hour window most days. The need to choose "clean , green" food 50 to 75% of the time is discussed, as well as to make water the primary drink and set a goal of 64 ounces  water daily.  Encouraged to start a food diary,  and to consider  joining a support group. Sample diet sheets offered. Goals set by the patient for the next several months.   Weight /BMI 08/01/2018 07/23/2018 07/05/2018  WEIGHT 179 lb 178 lb 178 lb  HEIGHT 5\' 5"  5\' 5"  5\' 5"   BMI 29.79 kg/m2 29.62 kg/m2 29.62 kg/m2      Great toe pain, left Improved pain following trauma when she had her nail cut by Podiatry. On exam, no infection present. I advised her to let MD at office know her experience , stated that new person had cut her nail and she had no problem prior to this for the 2 years she had been going for

## 2018-08-01 NOTE — Patient Instructions (Signed)
F/U with MD in September , mid, call if you need before  Please schedule mammogram for August  Fasting lipid , cmp and EGFR first week in September.  Please reduce fatty foods and work consistently on the diabetes  Your blood pressure is excellent  No infection of toe   Test sugar as directed and follow eating plan aim for 90%  Kidney function is not as good as before , but this will improve as you control your blood sugar  It is important that you exercise regularly at least 30 minutes 5 times a week. If you develop chest pain, have severe difficulty breathing, or feel very tired, stop exercising immediately and seek medical attention    Thank you  for choosing [facility]. We consider it a privelige to serve you.  Delivering excellent health care in a caring and  compassionate way is our goal.  Partnering with you,  so that together we can achieve this goal is our strategy.

## 2018-08-07 ENCOUNTER — Other Ambulatory Visit: Payer: Self-pay | Admitting: Family Medicine

## 2018-09-07 ENCOUNTER — Ambulatory Visit (HOSPITAL_COMMUNITY): Payer: Self-pay

## 2018-09-07 ENCOUNTER — Other Ambulatory Visit: Payer: Self-pay | Admitting: Family Medicine

## 2018-09-10 ENCOUNTER — Telehealth: Payer: Self-pay | Admitting: Internal Medicine

## 2018-09-10 NOTE — Telephone Encounter (Signed)
New Message  Patient is returning call in reference to her upcoming appt on 04/30.

## 2018-09-10 NOTE — Telephone Encounter (Signed)
Hilty patient.  Thanks!

## 2018-09-10 NOTE — Telephone Encounter (Signed)
Called patient to discuss upcoming appointment.  Patient requested to cancel. Advised her it could be August before patients are seen in the office. Patient verbalized understanding. Not currently experiencing any problems. Advised patient to call if she needed anything. Appointment cancelled and rescheduled for 12/31/18 at 8:45a.

## 2018-09-13 ENCOUNTER — Ambulatory Visit: Payer: Medicare Other | Admitting: Internal Medicine

## 2018-09-25 DIAGNOSIS — E119 Type 2 diabetes mellitus without complications: Secondary | ICD-10-CM | POA: Diagnosis not present

## 2018-09-25 DIAGNOSIS — H401131 Primary open-angle glaucoma, bilateral, mild stage: Secondary | ICD-10-CM | POA: Diagnosis not present

## 2018-09-25 DIAGNOSIS — Z961 Presence of intraocular lens: Secondary | ICD-10-CM | POA: Diagnosis not present

## 2018-09-25 DIAGNOSIS — Z794 Long term (current) use of insulin: Secondary | ICD-10-CM | POA: Diagnosis not present

## 2018-09-25 LAB — HM DIABETES EYE EXAM

## 2018-09-26 DIAGNOSIS — L602 Onychogryphosis: Secondary | ICD-10-CM | POA: Diagnosis not present

## 2018-09-26 DIAGNOSIS — E1351 Other specified diabetes mellitus with diabetic peripheral angiopathy without gangrene: Secondary | ICD-10-CM | POA: Diagnosis not present

## 2018-09-28 DIAGNOSIS — E1122 Type 2 diabetes mellitus with diabetic chronic kidney disease: Secondary | ICD-10-CM | POA: Diagnosis not present

## 2018-09-28 DIAGNOSIS — Z794 Long term (current) use of insulin: Secondary | ICD-10-CM | POA: Diagnosis not present

## 2018-09-28 DIAGNOSIS — N181 Chronic kidney disease, stage 1: Secondary | ICD-10-CM | POA: Diagnosis not present

## 2018-09-29 LAB — COMPLETE METABOLIC PANEL WITH GFR
AG Ratio: 1.4 (calc) (ref 1.0–2.5)
ALT: 21 U/L (ref 6–29)
AST: 20 U/L (ref 10–35)
Albumin: 4.2 g/dL (ref 3.6–5.1)
Alkaline phosphatase (APISO): 49 U/L (ref 37–153)
BUN/Creatinine Ratio: 20 (calc) (ref 6–22)
BUN: 21 mg/dL (ref 7–25)
CO2: 32 mmol/L (ref 20–32)
Calcium: 10.1 mg/dL (ref 8.6–10.4)
Chloride: 97 mmol/L — ABNORMAL LOW (ref 98–110)
Creat: 1.03 mg/dL — ABNORMAL HIGH (ref 0.60–0.93)
GFR, Est African American: 61 mL/min/{1.73_m2} (ref >=60)
GFR, Est Non African American: 53 mL/min/{1.73_m2} — ABNORMAL LOW (ref >=60)
Globulin: 3.1 g/dL (calc) (ref 1.9–3.7)
Glucose, Bld: 148 mg/dL — ABNORMAL HIGH (ref 65–99)
Potassium: 4.3 mmol/L (ref 3.5–5.3)
Sodium: 137 mmol/L (ref 135–146)
Total Bilirubin: 0.4 mg/dL (ref 0.2–1.2)
Total Protein: 7.3 g/dL (ref 6.1–8.1)

## 2018-09-29 LAB — HEMOGLOBIN A1C
Hgb A1c MFr Bld: 8.8 % of total Hgb — ABNORMAL HIGH (ref <5.7)
Mean Plasma Glucose: 206 (calc)
eAG (mmol/L): 11.4 (calc)

## 2018-10-04 ENCOUNTER — Other Ambulatory Visit: Payer: Self-pay

## 2018-10-04 ENCOUNTER — Encounter: Payer: Self-pay | Admitting: "Endocrinology

## 2018-10-04 ENCOUNTER — Encounter (INDEPENDENT_AMBULATORY_CARE_PROVIDER_SITE_OTHER): Payer: Self-pay

## 2018-10-04 ENCOUNTER — Ambulatory Visit (INDEPENDENT_AMBULATORY_CARE_PROVIDER_SITE_OTHER): Payer: Medicare Other | Admitting: "Endocrinology

## 2018-10-04 VITALS — BP 145/79 | HR 67 | Ht 65.0 in | Wt 179.0 lb

## 2018-10-04 DIAGNOSIS — Z794 Long term (current) use of insulin: Secondary | ICD-10-CM

## 2018-10-04 DIAGNOSIS — E1122 Type 2 diabetes mellitus with diabetic chronic kidney disease: Secondary | ICD-10-CM | POA: Diagnosis not present

## 2018-10-04 DIAGNOSIS — E782 Mixed hyperlipidemia: Secondary | ICD-10-CM

## 2018-10-04 DIAGNOSIS — I10 Essential (primary) hypertension: Secondary | ICD-10-CM

## 2018-10-04 DIAGNOSIS — N181 Chronic kidney disease, stage 1: Secondary | ICD-10-CM

## 2018-10-04 NOTE — Progress Notes (Signed)
Endocrinology follow-up note   Subjective:    Patient ID: Joanna Brown, female    DOB: 01-05-1941. Patient is being seen  For follow-up for management of diabetes requested by  Fayrene Helper, MD  Past Medical History:  Diagnosis Date  . Blood transfusion    1980  . Diabetes mellitus   . Dysrhythmia   . Female bladder prolapse   . GERD (gastroesophageal reflux disease)   . Heart disease   . Hyperlipidemia   . Hypertension    echo and stress 4/10 reports on chart, EKG ` LOV 9/12 on chart  . Mild aortic stenosis    Past Surgical History:  Procedure Laterality Date  . ABDOMINAL HYSTERECTOMY    . ANTERIOR AND POSTERIOR REPAIR  04/26/2011   Procedure: ANTERIOR (CYSTOCELE) AND POSTERIOR REPAIR (RECTOCELE);  Surgeon: Reece Packer, MD;  Location: [facility];  Service: Urology;  Laterality: N/A;  . CATARACT EXTRACTION Bilateral    with IOL  . CHOLECYSTECTOMY  2009  . COLONOSCOPY N/A 12/02/2013   Procedure: COLONOSCOPY;  Surgeon: Danie Binder, MD;  Location: [facility];  Service: Endoscopy;  Laterality: N/A;  9:30 AM - rescheduled to 8:30 - Doris to notify pt  . COLONOSCOPY W/ POLYPECTOMY    . LEFT HEART CATH  09/10/2008   normal coronary arteries, normal LV systolic function, EF 46% (Dr. [name])  . LYMPH NODE DISSECTION Right 1997   under arm  . NM MYOCAR PERF WALL MOTION  2010   dipyridamole - mild-mod in intenstiy perfusion defect in mid anterior, mid anteroseptal wall, EF 70%  . OVARY SURGERY     bilateral tumors removed  . THYROIDECTOMY    . THYROIDECTOMY  02/2008  . TRANSTHORACIC ECHOCARDIOGRAM  08/2011   EF=>55%, mild conc LVH; trace MR; mild TR; mild-mod AV calcification with mild valvular AV stenosis  . VAGINAL PROLAPSE REPAIR  04/26/2011   Procedure: VAGINAL VAULT SUSPENSION;  Surgeon: Reece Packer, MD;  Location: [facility];  Service: Urology;  Laterality: N/A;  with Graft  10x6   Social History   Socioeconomic History  . Marital status: Married     Spouse name: [name]  . Number of children: 1  . Years of education: Trade  . Highest education level: 12th grade  Occupational History  . Occupation: Retired  Scientific laboratory technician  . Financial resource strain: Not very hard  . Food insecurity:    Worry: Never true    Inability: Never true  . Transportation needs:    Medical: No    Non-medical: No  Tobacco Use  . Smoking status: Never Smoker  . Smokeless tobacco: Never Used  Substance and Sexual Activity  . Alcohol use: No    Comment: socially- none x 30 years  . Drug use: No  . Sexual activity: Yes  Lifestyle  . Physical activity:    Days per week: 3 days    Minutes per session: 40 min  . Stress: Only a little  Relationships  . Social connections:    Talks on phone: Twice a week    Gets together: Twice a week    Attends religious service: More than 4 times per year    Active member of club or organization: Yes    Attends meetings of clubs or organizations: More than 4 times per year    Relationship status: Married  Other Topics Concern  . Not on file  Social History Narrative   Patient lives at home with spouse.  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 LIVED IN [place] FOR 50 YRS AND CAME BACK TO [place] ~2009. Caffeine Use: 1 cup of coffee daily. HAD ONE CHILD: PASSED 5 YRS. HAVE THREE GRAND-KIDS AND TWO GREAT GRANDS.    Outpatient Encounter Medications as of 10/04/2018  Medication Sig  . amLODipine (NORVASC) 10 MG tablet TAKE 1 TABLET(10 MG) BY MOUTH EVERY MORNING  . aspirin 81 MG tablet Take 81 mg by mouth every morning.   . B-D ULTRAFINE III SHORT PEN 31G X 8 MM MISC USE AS DIRECTED WITH INSULIN PENS  . benazepril (LOTENSIN) 40 MG tablet TAKE 1 TABLET(40 MG) BY MOUTH DAILY  . Calcium Carbonate-Vit D-Min (CALCIUM 1200) 1200-1000 MG-UNIT CHEW Chew 1 each by mouth daily.  . cholecalciferol (VITAMIN D3) 25 MCG (1000 UT) tablet Take 1,000 Units by mouth daily.  Marland Kitchen ezetimibe (ZETIA) 10 MG tablet Take 10 mg  by mouth daily.  . ferrous sulfate 325 (65 FE) MG tablet Take 325 mg by mouth daily with breakfast.  . glucose blood (TRUE METRIX BLOOD GLUCOSE TEST) test strip Test BG 4 x daily E11.65  . Insulin Detemir (LEVEMIR FLEXTOUCH) 100 UNIT/ML Pen Inject 80 Units into the skin at bedtime.  . metFORMIN (GLUCOPHAGE) 500 MG tablet TAKE 1 TABLET BY MOUTH EVERY DAY WITH BREAKFAST  . Multiple Vitamins-Minerals (CENTRUM) tablet Take 1 tablet by mouth every morning.   . Nutritional Supplements (NUTRITIONAL SUPPLEMENT PO) Take by mouth. Neurx-TF (formulation for peripheral nerve health)  . Omega-3 Fatty Acids (FISH OIL) 1200 MG CAPS Take 1 capsule by mouth every morning.   . pravastatin (PRAVACHOL) 80 MG tablet TAKE 1 TABLET(80 MG) BY MOUTH DAILY  . Travoprost, BAK Free, (TRAVATAN) 0.004 % SOLN ophthalmic solution Place 1 drop into both eyes at bedtime.   . triamterene-hydrochlorothiazide (MAXZIDE) 75-50 MG tablet TAKE 1 TABLET BY MOUTH DAILY  . UNABLE TO FIND Diabetic shoes x 1  Inserts x 3  Dx E11.9   No facility-administered encounter medications on file as of 10/04/2018.    ALLERGIES: Allergies  Allergen Reactions  . Senokot Wheat Bran [Wheat Bran]     ABDOMINAL CRAMPS  . Spironolactone     Stomach problems, vision changes    VACCINATION STATUS: Immunization History  Administered Date(s) Administered  . Pneumococcal Conjugate-13 12/11/2013  . Pneumococcal Polysaccharide-23 01/13/2010  . Tdap 10/05/2010    Diabetes  She presents for her follow-up diabetic visit. She has type 2 diabetes mellitus. Onset time: She was diagnosed at approximate age of 55 years. Her disease course has been improving. There are no hypoglycemic associated symptoms. Pertinent negatives for hypoglycemia include no confusion, headaches, pallor or seizures. Associated symptoms include polydipsia and polyuria. Pertinent negatives for diabetes include no blurred vision, no chest pain, no fatigue and no polyphagia. There are no  hypoglycemic complications. Symptoms are improving. Diabetic complications include nephropathy and retinopathy. Risk factors for coronary artery disease include dyslipidemia, diabetes mellitus, obesity and sedentary lifestyle. Her weight is fluctuating minimally. She is following a generally unhealthy diet. When asked about meal planning, she reported none. She has had a previous visit with a dietitian. She rarely participates in exercise. Her breakfast blood glucose range is generally 140-180 mg/dl. Her bedtime blood glucose range is generally 180-200 mg/dl. Her overall blood glucose range is 180-200 mg/dl. Eye exam is current.  Hyperlipidemia  This is a chronic problem. The current episode started more than 1 year ago. The problem is uncontrolled. Exacerbating diseases include diabetes and  obesity. Pertinent negatives include no chest pain, myalgias or shortness of breath. Current antihyperlipidemic treatment includes statins. Risk factors for coronary artery disease include dyslipidemia, diabetes mellitus, hypertension, obesity, a sedentary lifestyle and post-menopausal.  Hypertension  This is a chronic problem. The current episode started more than 1 year ago. Pertinent negatives include no blurred vision, chest pain, headaches, palpitations or shortness of breath. Risk factors for coronary artery disease include dyslipidemia, diabetes mellitus, obesity and sedentary lifestyle. Hypertensive end-organ damage includes retinopathy.    Review of Systems  Constitutional: Negative for chills, fatigue, fever and unexpected weight change.  HENT: Negative for trouble swallowing and voice change.   Eyes: Negative for blurred vision and visual disturbance.  Respiratory: Negative for cough, shortness of breath and wheezing.   Cardiovascular: Negative for chest pain, palpitations and leg swelling.  Gastrointestinal: Negative for diarrhea, nausea and vomiting.  Endocrine: Positive for polydipsia and polyuria.  Negative for cold intolerance, heat intolerance and polyphagia.  Musculoskeletal: Negative for arthralgias and myalgias.  Skin: Negative for color change, pallor, rash and wound.  Neurological: Negative for seizures and headaches.  Psychiatric/Behavioral: Negative for confusion and suicidal ideas.    Objective:    BP (!) 145/79   Pulse 67   Ht 5\' 5"  (1.651 m)   Wt 179 lb (81.2 kg)   BMI 29.79 kg/m   Wt Readings from Last 3 Encounters:  10/04/18 179 lb (81.2 kg)  08/01/18 179 lb (81.2 kg)  07/23/18 178 lb (80.7 kg)    Physical Exam  Constitutional: She is oriented to person, place, and time. She appears well-developed.  HENT:  Head: Normocephalic and atraumatic.  Eyes: Pupils are equal, round, and reactive to light.  Neck: Normal range of motion. Neck supple. No tracheal deviation present. No thyromegaly present.  Cardiovascular: Normal rate.  Pulmonary/Chest: Effort normal.  Abdominal: There is no abdominal tenderness. There is no guarding.  Musculoskeletal: Normal range of motion.        General: No edema.  Neurological: She is alert and oriented to person, place, and time. No cranial nerve deficit. Coordination normal.  Skin: Skin is warm and dry. No rash noted. No erythema. No pallor.  Psychiatric: She has a normal mood and affect. Judgment normal.    CMP ( most recent) CMP     Component Value Date/Time   NA 137 09/28/2018 0721   K 4.3 09/28/2018 0721   CL 97 (L) 09/28/2018 0721   CO2 32 09/28/2018 0721   GLUCOSE 148 (H) 09/28/2018 0721   BUN 21 09/28/2018 0721   CREATININE 1.03 (H) 09/28/2018 0721   CALCIUM 10.1 09/28/2018 0721   PROT 7.3 09/28/2018 0721   ALBUMIN 4.1 03/27/2018 0808   AST 20 09/28/2018 0721   ALT 21 09/28/2018 0721   ALKPHOS 46 03/27/2018 0808   BILITOT 0.4 09/28/2018 0721   GFRNONAA 53 (L) 09/28/2018 0721   GFRAA 61 09/28/2018 0721     Diabetic Labs (most recent): Lab Results  Component Value Date   HGBA1C 8.8 (H) 09/28/2018    HGBA1C 9.4 (H) 06/28/2018   HGBA1C 8.5 (H) 03/27/2018    Lipid Panel     Component Value Date/Time   CHOL 145 06/27/2018 0736   TRIG 225 (H) 06/27/2018 0736   HDL 41 (L) 06/27/2018 0736   CHOLHDL 3.5 06/27/2018 0736   VLDL 39 12/05/2017 0832   LDLCALC 73 06/27/2018 0736     Assessment & Plan:   1. Type 2 diabetes mellitus with stage 1 chronic  kidney disease, with long-term current use of insulin ([place])  - Patient has currently uncontrolled symptomatic type 2 DM since  78 years of age. - She did bring her logs, showing significantly improved fasting blood glucose profile, and A1c of 8.8% improving from 9.4%.    Recent labs reviewed, showing improving renal function.     Her diabetes is complicated by CKD obesity/sedentary life and patient remains at a high risk for more acute and chronic complications of diabetes which include CAD, CVA, CKD, retinopathy, and neuropathy. These are all discussed in detail with the patient.  - I have counseled the patient on diet management and weight loss, by adopting a carbohydrate restricted/protein rich diet.  -She still admits to dietary indiscretions including consumption of sweets and sweetened beverages. - Patient admits there is a room for improvement in her diet and drink choices. -  Suggestion is made for her to avoid simple carbohydrates  from her diet including Cakes, Sweet Desserts / Pastries, Ice Cream, Soda (diet and regular), Sweet Tea, Candies, Chips, Cookies, Store Bought Juices, Alcohol in Excess of  1-2 drinks a day, Artificial Sweeteners, and "Sugar-free" Products. This will help patient to have stable blood glucose profile and potentially avoid unintended weight gain.   - I encouraged the patient to switch to  unprocessed or minimally processed complex starch and increased protein intake (animal or plant source), fruits, and vegetables.  - Patient is advised to stick to a routine mealtimes to eat 3 meals  a day and avoid  unnecessary snacks ( to snack only to correct hypoglycemia).   - I have approached patient with the following individualized plan to manage diabetes and patient agrees:   -She is advised to continue Levemir 80 units nightly,  associated with strict monitoring of blood glucose 2 times a day-daily before breakfast and at bedtime.    -She is hesitant to add prandial insulin at this time.    -Patient is encouraged to call clinic for blood glucose levels less than 70 or above 200 mg /dl. -She is advised to continue metformin  500 mg by mouth twice a day, therapeutically suitable for patient. -Patient is not a candidate for  SGLT2 inhibitors due to CKD. -If her A1c remains above  9% on next visit, she will be reconsidered for  prandial insulin in addition to her basal insulin.  - Patient specific target  A1c;  LDL, HDL, Triglycerides, and  Waist Circumference were discussed in detail.  2) BP/HTN: Her blood pressure is not controlled to target.  She is advised to continue her current blood pressure medications including benazepril 40 mg p.o. daily.   3) Lipids/HPL: Her recent lipid panel showed uncontrolled LDL at 97.  She is advised to continue pravastatin 80 mg p.o. nightly.   4)  Weight/Diet: CDE Consult has been initiated , exercise, and detailed carbohydrates information provided.  5) Chronic Care/Health Maintenance:  -Patient is on ACEI/ARB and Statin medications and encouraged to continue to follow up with Ophthalmology, Podiatrist at least yearly or according to recommendations, and advised to  stay away from smoking. I have recommended yearly flu vaccine and pneumonia vaccination at least every 5 years; moderate intensity exercise for up to 150 minutes weekly; and  sleep for at least 7 hours a day.   - I advised patient to maintain close follow up with Fayrene Helper, MD for primary care needs.  - Time spent with the patient: 25 min, of which >50% was spent in reviewing  her blood  glucose logs , discussing her hypoglycemia and hyperglycemia episodes, reviewing her current and  previous labs / studies and medications  doses and developing a plan to avoid hypoglycemia and hyperglycemia. Please refer to Patient Instructions for Blood Glucose Monitoring and Insulin/Medications Dosing Guide"  in media tab for additional information. Please  also refer to " Patient Self Inventory" in the Media  tab for reviewed elements of pertinent patient history.  Derinda Late Dube participated in the discussions, expressed understanding, and voiced agreement with the above plans.  All questions were answered to her satisfaction. she is encouraged to contact clinic should she have any questions or concerns prior to her return visit.    Follow up plan: - Return in about 3 months (around 01/04/2019) for Meter, and Logs.  Glade Lloyd, MD Phone: [phone]  Fax: [phone]   This note was partially dictated with voice recognition software. Similar sounding words can be transcribed inadequately or may not  be corrected upon review.  10/04/2018, 9:48 AM

## 2018-10-04 NOTE — Patient Instructions (Signed)

## 2018-10-06 ENCOUNTER — Other Ambulatory Visit: Payer: Self-pay | Admitting: "Endocrinology

## 2018-10-10 ENCOUNTER — Ambulatory Visit (HOSPITAL_COMMUNITY)
Admission: RE | Admit: 2018-10-10 | Discharge: 2018-10-10 | Disposition: A | Discharge status: Home / Self Care | Payer: Medicare Other | Source: Ambulatory Visit | Attending: Family Medicine | Admitting: Family Medicine

## 2018-10-10 ENCOUNTER — Other Ambulatory Visit: Payer: Self-pay

## 2018-10-10 DIAGNOSIS — Z1231 Encounter for screening mammogram for malignant neoplasm of breast: Secondary | ICD-10-CM | POA: Diagnosis not present

## 2018-10-23 ENCOUNTER — Other Ambulatory Visit: Payer: Self-pay | Admitting: "Endocrinology

## 2018-11-12 ENCOUNTER — Other Ambulatory Visit: Payer: Self-pay | Admitting: "Endocrinology

## 2018-11-15 ENCOUNTER — Other Ambulatory Visit: Payer: Self-pay | Admitting: Family Medicine

## 2018-12-12 DIAGNOSIS — E1351 Other specified diabetes mellitus with diabetic peripheral angiopathy without gangrene: Secondary | ICD-10-CM | POA: Diagnosis not present

## 2018-12-12 DIAGNOSIS — L602 Onychogryphosis: Secondary | ICD-10-CM | POA: Diagnosis not present

## 2018-12-30 ENCOUNTER — Other Ambulatory Visit: Payer: Self-pay | Admitting: "Endocrinology

## 2018-12-31 ENCOUNTER — Other Ambulatory Visit: Payer: Self-pay

## 2018-12-31 ENCOUNTER — Ambulatory Visit (INDEPENDENT_AMBULATORY_CARE_PROVIDER_SITE_OTHER): Payer: Medicare Other | Admitting: Internal Medicine

## 2018-12-31 ENCOUNTER — Encounter: Payer: Self-pay | Admitting: Internal Medicine

## 2018-12-31 VITALS — BP 146/80 | HR 67 | Ht 65.0 in | Wt 181.6 lb

## 2018-12-31 DIAGNOSIS — Z79899 Other long term (current) drug therapy: Secondary | ICD-10-CM

## 2018-12-31 DIAGNOSIS — R0601 Orthopnea: Secondary | ICD-10-CM

## 2018-12-31 DIAGNOSIS — I350 Nonrheumatic aortic (valve) stenosis: Secondary | ICD-10-CM | POA: Diagnosis not present

## 2018-12-31 DIAGNOSIS — I10 Essential (primary) hypertension: Secondary | ICD-10-CM | POA: Diagnosis not present

## 2018-12-31 DIAGNOSIS — R0602 Shortness of breath: Secondary | ICD-10-CM | POA: Diagnosis not present

## 2018-12-31 NOTE — Patient Instructions (Signed)
Medication Instructions:  Your physician recommends that you continue on your current medications as directed. Please refer to the Current Medication list given to you today.  If you need a refill on your cardiac medications before your next appointment, please call your pharmacy.   Lab work: BNP, CMET today If you have labs (blood work) drawn today and your tests are completely normal, you will receive your results only by: Marland Kitchen MyChart Message (if you have MyChart) OR . A paper copy in the mail If you have any lab test that is abnormal or we need to change your treatment, we will call you to review the results.  Testing/Procedures: Echo @ [place]: At [facility], you and your health needs are our priority.  As part of our continuing mission to provide you with exceptional heart care, we have created designated Provider Care Teams.  These Care Teams include your primary Cardiologist (physician) and Advanced Practice Providers (APPs -  Physician Assistants and Nurse Practitioners) who all work together to provide you with the care you need, when you need it. You will need a follow up appointment in 4 weeks.  You may see Dr. [name] or one of the following Advanced Practice Providers on your designated Care Team: Almyra Deforest, [place] . Fabian Sharp, PA-C  Any Other Special Instructions Will Be Listed Below (If Applicable).

## 2018-12-31 NOTE — Progress Notes (Signed)
OFFICE NOTE  Chief Complaint:  Shortness of breath  Primary Care Physician: Fayrene Helper, MD  HPI:  Joanna Brown is a 78 year old female who has a history of mild aortic stenosis with a valve area of approximately 1.7 cm, also a history of dyslipidemia and hypertension, both of which have been well controlled. We are seeing her back for an annual visit today. She reports actually feeling very well, started walking every day, has made major changes to her diet as reflected by a marked decrease in triglycerides. Unfortunately recently she had a mild elevation in liver enzymes slightly above normal on lovastatin. Typically we could tolerate liver enzymes up to 3 times normal on statin medications, however, she was taken off the lovastatin. Either way it is questionable whether she really needs the additional statin at this time and this certainly argues that she should have further workup as to why she had elevated liver enzymes, whether this is due to a new non-alcoholic steatohepatitis or perhaps concomitant medications causing her elevated liver enzymes.  In fact, I reviewed her CT scan today he years ago and she does have steatohepatitis.  Therefore she is not a good candidate for a statin medication. Her blood pressure seems to be well-controlled on her current regimen.  I saw Joanna Brown back in the office today. She is without any new complaints. She occasionally gets some heartburn and is not currently taking medication for that. She denies any chest pain or worsening shortness of breath.  Joanna Brown returns today for follow-up. Again she is without complaints. We discussed her aortic stenosis however she seemed to have little recollection that she has this disorder. I again went over aortic stenosis and the fact that she has mild to moderate narrowing of the aortic valve. This time we are monitoring it clinically. Her last echo was in April of last year. She is asymptomatic. Her  blood pressure is well controlled. She had recent laboratory work which shows an LDL cholesterol of 70 in October which is good control. Her hemoglobin A1c is 7 which is down from 8.3 prior to that. I've encouraged her to continue with this trend is good cholesterol and blood sugar control her helpful in slowing the process of aortic stenosis.  09/12/2016  Joanna Brown was seen today in follow-up. Overall she seems to be doing well. She has no complaints such as shortness of breath or chest pain. EKG is stable showing normal sinus rhythm at 70 with LAFB. Blood pressure is at goal today. She reports her 11 A1c is in the low 7 range. Cholesterol is also been fairly well controlled. She does have a history of moderate aortic stenosis and is due for repeat echo.  09/12/2017  Joanna Brown was seen today in follow-up.  Over the past year she denies any chest pain or worsening shortness of breath.  Unfortunately her hemoglobin A1c is up from the low sevens to 8.2.  Cholesterol also is higher than goal.  Her total cholesterol is 171, HDL 40, LDL 97 and triglycerides 227.  She reports compliance with pravastatin.  She has moderate aortic stenosis with a mean gradient of 20 mmHg based on echo last year and normal LV function.  She did report one brief episode of chest discomfort with more marked exertion a few days ago.  This was right sided underneath the right breast and was a crampy sensation which improved after resting.  I advised that she monitor that further and  if she has more recurrence we may need to consider stress testing.  12/31/2018  Joanna Brown is seen today for routine follow-up.  She was seen in April 2019.  Since then she reports she was doing fairly well up to about 2 weeks ago.  She has had some worsening shortness of breath, particularly at night and while laying down.  She has a mild nonproductive cough.  She also reports some occasional lower extremity edema.  She gets some shortness of breath  with exertion but denies any chest pain, pressure, heaviness or other anginal symptoms.  EKG today shows incomplete right bundle branch pattern.  Her diabetes not well controlled with A1c recently of 8.8.  Her recent cholesterol profile in February showed total cholesterol 145, triglycerides 225, HDL 41 and LDL 73.  She has known aortic stenosis which is at least moderate however not assessed since 2018 by echo.  PMHx:  Past Medical History:  Diagnosis Date   Blood transfusion    1980   Diabetes mellitus    Dysrhythmia    Female bladder prolapse    GERD (gastroesophageal reflux disease)    Heart disease    Hyperlipidemia    Hypertension    echo and stress 4/10 reports on chart, EKG ` LOV 9/12 on chart   Mild aortic stenosis     Past Surgical History:  Procedure Laterality Date   ABDOMINAL HYSTERECTOMY     ANTERIOR AND POSTERIOR REPAIR  04/26/2011   Procedure: ANTERIOR (CYSTOCELE) AND POSTERIOR REPAIR (RECTOCELE);  Surgeon: Reece Packer, MD;  Location: [facility];  Service: Urology;  Laterality: N/A;   CATARACT EXTRACTION Bilateral    with IOL   CHOLECYSTECTOMY  2009   COLONOSCOPY N/A 12/02/2013   Procedure: COLONOSCOPY;  Surgeon: Danie Binder, MD;  Location: [facility];  Service: Endoscopy;  Laterality: N/A;  9:30 AM - rescheduled to 8:30 - Doris to notify pt   COLONOSCOPY W/ POLYPECTOMY     LEFT HEART CATH  09/10/2008   normal coronary arteries, normal LV systolic function, EF 54% (Dr. [name])   [place] Right 1997   under arm   [place]  2010   dipyridamole - mild-mod in intenstiy perfusion defect in mid anterior, mid anteroseptal wall, EF 70%   OVARY SURGERY     bilateral tumors removed   THYROIDECTOMY     THYROIDECTOMY  02/2008   TRANSTHORACIC ECHOCARDIOGRAM  08/2011   EF=>55%, mild conc LVH; trace MR; mild TR; mild-mod AV calcification with mild valvular AV stenosis   VAGINAL PROLAPSE REPAIR  04/26/2011    Procedure: VAGINAL VAULT SUSPENSION;  Surgeon: Reece Packer, MD;  Location: [facility];  Service: Urology;  Laterality: N/A;  with Graft  10x6    FAMHx:  Family History  Problem Relation Age of Onset   Stomach cancer Mother 40   Heart disease Mother 23       heart disease   Heart disease Father 9       MI   Stroke Maternal Grandfather    Heart attack Paternal Grandfather    Hypertension Brother    Bone cancer Brother    Hypertension Sister    Liver cancer Sister    Hypertension Sister    Hypertension Child     SOCHx:   reports that she has never smoked. She has never used smokeless tobacco. She reports that she does not drink alcohol or use drugs.  ALLERGIES:  Allergies  Allergen Reactions   Senokot Wheat Bran [Wheat Bran]     ABDOMINAL CRAMPS   Spironolactone     Stomach problems, vision changes     ROS: Pertinent items noted in HPI and remainder of comprehensive ROS otherwise negative.  HOME MEDS: Current Outpatient Medications  Medication Sig Dispense Refill   amLODipine (NORVASC) 10 MG tablet TAKE 1 TABLET(10 MG) BY MOUTH EVERY MORNING 90 tablet 1   aspirin 81 MG tablet Take 81 mg by mouth every morning.      B-D ULTRAFINE III SHORT PEN 31G X 8 MM MISC USE AS DIRECTED AT BEDTIME WITH INSULIN PENS 100 each 3   benazepril (LOTENSIN) 40 MG tablet TAKE 1 TABLET(40 MG) BY MOUTH DAILY 90 tablet 1   Calcium Carbonate-Vit D-Min (CALCIUM 1200) 1200-1000 MG-UNIT CHEW Chew 1 each by mouth daily.     cholecalciferol (VITAMIN D3) 25 MCG (1000 UT) tablet Take 1,000 Units by mouth daily.     ezetimibe (ZETIA) 10 MG tablet Take 10 mg by mouth daily.     ferrous sulfate 325 (65 FE) MG tablet Take 325 mg by mouth daily with breakfast.     glucose blood (TRUE METRIX BLOOD GLUCOSE TEST) test strip Test BG 4 x daily E11.65 150 each 5   LEVEMIR FLEXTOUCH 100 UNIT/ML Pen INJECT 70 UNITS INTO THE SKIN AT BEDTIME. 15 mL 2   metFORMIN (GLUCOPHAGE) 500 MG tablet  TAKE 1 TABLET BY MOUTH EVERY DAY WITH BREAKFAST 60 tablet 2   Multiple Vitamins-Minerals (CENTRUM) tablet Take 1 tablet by mouth every morning.      Nutritional Supplements (NUTRITIONAL SUPPLEMENT PO) Take by mouth. Neurx-TF (formulation for peripheral nerve health)     Omega-3 Fatty Acids (FISH OIL) 1200 MG CAPS Take 1 capsule by mouth every morning.      pravastatin (PRAVACHOL) 80 MG tablet TAKE 1 TABLET(80 MG) BY MOUTH DAILY 90 tablet 1   Travoprost, BAK Free, (TRAVATAN) 0.004 % SOLN ophthalmic solution Place 1 drop into both eyes at bedtime.      triamterene-hydrochlorothiazide (MAXZIDE) 75-50 MG tablet TAKE 1 TABLET BY MOUTH DAILY 90 tablet 1   UNABLE TO FIND Diabetic shoes x 1  Inserts x 3  Dx E11.9 1 each 0   No current facility-administered medications for this visit.     LABS/IMAGING: No results found for this or any previous visit (from the past 48 hour(s)). No results found.  VITALS: BP (!) 146/80    Pulse 67    Ht 5\' 5"  (1.651 m)    Wt 181 lb 9.6 oz (82.4 kg)    SpO2 99%    BMI 30.22 kg/m   EXAM: General appearance: alert and no distress Neck: no carotid bruit and no JVD Lungs: clear to auscultation bilaterally Heart: regular rate and rhythm, S1, S2 normal and systolic murmur: early systolic 3/6, crescendo at 2nd left intercostal space Abdomen: soft, non-tender; bowel sounds normal; no masses,  no organomegaly Extremities: extremities normal, atraumatic, no cyanosis or edema Pulses: 2+ and symmetric Skin: Skin color, texture, turgor normal. No rashes or lesions Neurologic: Grossly normal Psych: Mood, affect normal  EKG: Normal sinus rhythm 67, left axis deviation, incomplete right bundle branch block-personally reviewed  ASSESSMENT: 1. Progressive dyspnea on exertion, orthopnea and edema 2. Hypertension-controlled 3. Dyslipidemia 4. Moderate aortic stenosis 5. NAFLD 6. GERD 7. DM2 on insulin  PLAN: 1.   Joanna Brown is reporting progressive dyspnea on  exertion, orthopnea and lower extremity edema which is been intermittent over  the past several weeks.  She has at least moderate aortic stenosis by echo in 2018.  We will repeat that study and obtain a metabolic profile and BNP as well.  This could be worsening aortic stenosis however on exam today her aortic valve sounds moderate to severe.  Alternatively, this could be coronary artery disease given her longstanding diabetes.  If her echo is somewhat unrevealing or shows only moderate aortic stenosis, may consider ischemia evaluation.  Plan follow-up with me in 1 month.  [name], MD, [facility], [place] Director of the [facility] Lipid Disorders &  Cardiovascular Risk Reduction Clinic Diplomate of the American Board of Clinical Lipidology Attending Cardiologist  Direct Dial: [phone]   Fax: [phone]  Website:  [email or web address] 12/31/2018, 8:44 AM

## 2019-01-01 DIAGNOSIS — E1122 Type 2 diabetes mellitus with diabetic chronic kidney disease: Secondary | ICD-10-CM | POA: Diagnosis not present

## 2019-01-01 DIAGNOSIS — I10 Essential (primary) hypertension: Secondary | ICD-10-CM | POA: Diagnosis not present

## 2019-01-01 DIAGNOSIS — Z794 Long term (current) use of insulin: Secondary | ICD-10-CM | POA: Diagnosis not present

## 2019-01-01 DIAGNOSIS — N181 Chronic kidney disease, stage 1: Secondary | ICD-10-CM | POA: Diagnosis not present

## 2019-01-01 DIAGNOSIS — E782 Mixed hyperlipidemia: Secondary | ICD-10-CM | POA: Diagnosis not present

## 2019-01-01 LAB — COMPREHENSIVE METABOLIC PANEL
ALT: 26 IU/L (ref 0–32)
AST: 28 IU/L (ref 0–40)
Albumin/Globulin Ratio: 1.6 (ref 1.2–2.2)
Albumin: 4.6 g/dL (ref 3.7–4.7)
Alkaline Phosphatase: 57 IU/L (ref 39–117)
BUN/Creatinine Ratio: 14 (ref 12–28)
BUN: 13 mg/dL (ref 8–27)
Bilirubin Total: 0.2 mg/dL (ref 0.0–1.2)
CO2: 22 mmol/L (ref 20–29)
Calcium: 9.7 mg/dL (ref 8.7–10.3)
Chloride: 92 mmol/L — ABNORMAL LOW (ref 96–106)
Creatinine, Ser: 0.92 mg/dL (ref 0.57–1.00)
GFR calc Af Amer: 69 mL/min/{1.73_m2} (ref >59)
GFR calc non Af Amer: 60 mL/min/{1.73_m2} (ref >59)
Globulin, Total: 2.8 g/dL (ref 1.5–4.5)
Glucose: 156 mg/dL — ABNORMAL HIGH (ref 65–99)
Potassium: 3.9 mmol/L (ref 3.5–5.2)
Sodium: 135 mmol/L (ref 134–144)
Total Protein: 7.4 g/dL (ref 6.0–8.5)

## 2019-01-01 LAB — LIPID PANEL
Cholesterol: 175 mg/dL (ref <200)
HDL: 37 mg/dL — ABNORMAL LOW (ref >=50)
LDL Cholesterol (Calc): 107 mg/dL (calc) — ABNORMAL HIGH
Non-HDL Cholesterol (Calc): 138 mg/dL (calc) — ABNORMAL HIGH (ref <130)
Total CHOL/HDL Ratio: 4.7 (calc) (ref <5.0)
Triglycerides: 184 mg/dL — ABNORMAL HIGH (ref <150)

## 2019-01-01 LAB — BRAIN NATRIURETIC PEPTIDE: BNP: 8.3 pg/mL (ref 0.0–100.0)

## 2019-01-02 ENCOUNTER — Other Ambulatory Visit: Payer: Self-pay

## 2019-01-02 ENCOUNTER — Encounter: Payer: Self-pay | Admitting: Family Medicine

## 2019-01-02 ENCOUNTER — Ambulatory Visit (INDEPENDENT_AMBULATORY_CARE_PROVIDER_SITE_OTHER): Payer: Medicare Other | Admitting: Family Medicine

## 2019-01-02 VITALS — BP 132/78 | HR 98 | Temp 97.5°F | Resp 15 | Ht 65.0 in | Wt 178.0 lb

## 2019-01-02 DIAGNOSIS — N181 Chronic kidney disease, stage 1: Secondary | ICD-10-CM

## 2019-01-02 DIAGNOSIS — Z794 Long term (current) use of insulin: Secondary | ICD-10-CM | POA: Diagnosis not present

## 2019-01-02 DIAGNOSIS — E663 Overweight: Secondary | ICD-10-CM

## 2019-01-02 DIAGNOSIS — E1122 Type 2 diabetes mellitus with diabetic chronic kidney disease: Secondary | ICD-10-CM

## 2019-01-02 DIAGNOSIS — I10 Essential (primary) hypertension: Secondary | ICD-10-CM

## 2019-01-02 DIAGNOSIS — E782 Mixed hyperlipidemia: Secondary | ICD-10-CM | POA: Diagnosis not present

## 2019-01-02 LAB — COMPLETE METABOLIC PANEL WITH GFR
AG Ratio: 1.5 (calc) (ref 1.0–2.5)
ALT: 24 U/L (ref 6–29)
AST: 22 U/L (ref 10–35)
Albumin: 4.1 g/dL (ref 3.6–5.1)
Alkaline phosphatase (APISO): 46 U/L (ref 37–153)
BUN/Creatinine Ratio: 14 (calc) (ref 6–22)
BUN: 13 mg/dL (ref 7–25)
CO2: 30 mmol/L (ref 20–32)
Calcium: 9.8 mg/dL (ref 8.6–10.4)
Chloride: 96 mmol/L — ABNORMAL LOW (ref 98–110)
Creat: 0.96 mg/dL — ABNORMAL HIGH (ref 0.60–0.93)
GFR, Est African American: 66 mL/min/{1.73_m2} (ref >=60)
GFR, Est Non African American: 57 mL/min/{1.73_m2} — ABNORMAL LOW (ref >=60)
Globulin: 2.8 g/dL (calc) (ref 1.9–3.7)
Glucose, Bld: 130 mg/dL — ABNORMAL HIGH (ref 65–99)
Potassium: 3.9 mmol/L (ref 3.5–5.3)
Sodium: 135 mmol/L (ref 135–146)
Total Bilirubin: 0.4 mg/dL (ref 0.2–1.2)
Total Protein: 6.9 g/dL (ref 6.1–8.1)

## 2019-01-02 LAB — HEMOGLOBIN A1C
Hgb A1c MFr Bld: 8.9 % of total Hgb — ABNORMAL HIGH (ref <5.7)
Mean Plasma Glucose: 209 (calc)
eAG (mmol/L): 11.6 (calc)

## 2019-01-02 NOTE — Patient Instructions (Addendum)
F/U end Feb,, call if you need me before  Please reconsider flu vaccine  Fasting  Lipid, cmp and eGFr, cbc, tsh and microalb Jun 29, 2019 or shortly after  Please stop trail mix , and cut back a lot on nuts as bad cholesterol is slightly above goal  It is important that you exercise regularly at least 30 minutes 5 times a week. If you develop chest pain, have severe difficulty breathing, or feel very tired, stop exercising immediately and seek medical attention

## 2019-01-02 NOTE — Progress Notes (Signed)
Joanna Brown     MRN: [id number]      DOB: March 17, 1941   HPI Joanna Brown is here for follow up and re-evaluation of chronic medical conditions, medication management and review of any available recent lab and radiology data.  Preventive health is updated, specifically  Cancer screening and Immunization.   Questions or concerns regarding consultations or procedures which the PT has had in the interim are  addressed. The PT denies any adverse reactions to current medications since the last visit.  C/o inadequate diabetes control and gets shaking episodes when hungry and notes tremor of hands though not actively nervous, mnaged by  Endo and has upcoming appointment  ROS Denies recent fever or chills. Denies sinus pressure, nasal congestion, ear pain or sore throat. Denies chest congestion, productive cough or wheezing. Denies chest pains, palpitations and leg swelling Denies abdominal pain, nausea, vomiting,diarrhea or constipation.   Denies dysuria, frequency, hesitancy or incontinence. Denies joint pain, swelling and limitation in mobility. Denies headaches, seizures, numbness, or tingling. Denies depression, anxiety or insomnia. Denies skin break down or rash.   PE  BP 132/78   Pulse 98   Temp (!) 97.5 F (36.4 C) (Temporal)   Resp 15   Ht 5\' 5"  (1.651 m)   Wt 178 lb (80.7 kg)   SpO2 98%   BMI 29.62 kg/m   Patient alert and oriented and in no cardiopulmonary distress.  HEENT: No facial asymmetry, EOMI,   oropharynx pink and moist.  Neck supple no JVD, no mass.  Chest: Clear to auscultation bilaterally.  CVS: S1, S2 systolic murmur, no S3.Regular rate.  ABD: Soft non tender.   Ext: No edema  MS: Adequate ROM spine, shoulders, hips and knees.  Skin: Intact, no ulcerations or rash noted.  Psych: Good eye contact, normal affect. Memory intact not anxious or depressed appearing.  CNS: CN 2-12 intact, power,  normal throughout.no focal deficits noted.    Assessment & Plan  Essential hypertension Controlled, no change in medication DASH diet and commitment to daily physical activity for a minimum of 30 minutes discussed and encouraged, as a part of hypertension management. The importance of attaining a healthy weight is also discussed.  BP/Weight 01/02/2019 12/31/2018 10/04/2018 08/01/2018 07/23/2018 07/05/2018 82/99/3716  Systolic BP 967 893 810 175 102 585 277  Diastolic BP 78 80 79 70 71 79 68  Wt. (Lbs) 178 181.6 179 179 178 178 179  BMI 29.62 30.22 29.79 29.79 29.62 29.62 29.79       Type 2 diabetes mellitus with stage 1 chronic kidney disease, with long-term current use of insulin (HCC) Uncontrolled , n managed by emdo Joanna Brown is reminded of the importance of commitment to daily physical activity for 30 minutes or more, as able and the need to limit carbohydrate intake to 30 to 60 grams per meal to help with blood sugar control.   The need to take medication as prescribed, test blood sugar as directed, and to call between visits if there is a concern that blood sugar is uncontrolled is also discussed.   Joanna Brown is reminded of the importance of daily foot exam, annual eye examination, and good blood sugar, blood pressure and cholesterol control.  Diabetic Labs Latest Ref Rng & Units 01/01/2019 12/31/2018 09/28/2018 06/28/2018 06/27/2018  HbA1c <5.7 % of total Hgb 8.9(H) - 8.8(H) 9.4(H) -  Microalbumin mg/dL - - - 11.5 -  Micro/Creat Ratio <30 mcg/mg creat - - - 102(H) -  Chol <200  mg/dL 175 - - - 145  HDL > OR = 50 mg/dL 37(L) - - - 41(L)  Calc LDL mg/dL (calc) 107(H) - - - 73  Triglycerides <150 mg/dL 184(H) - - - 225(H)  Creatinine 0.60 - 0.93 mg/dL 0.96(H) 0.92 1.03(H) 1.12(H) 1.04(H)   BP/Weight 01/02/2019 12/31/2018 10/04/2018 08/01/2018 07/23/2018 07/05/2018 05/24/3233  Systolic BP 573 220 254 270 623 762 831  Diastolic BP 78 80 79 70 71 79 68  Wt. (Lbs) 178 181.6 179 179 178 178 179  BMI 29.62 30.22 29.79 29.79 29.62 29.62  29.79   Foot/eye exam completion dates Latest Ref Rng & Units 09/25/2018 04/02/2018  Eye Exam No Retinopathy No Retinopathy -  Foot Form Completion - - Done         Mixed hyperlipidemia Hyperlipidemia:Low fat diet discussed and encouraged.   Lipid Panel  Lab Results  Component Value Date   CHOL 175 01/01/2019   HDL 37 (L) 01/01/2019   LDLCALC 107 (H) 01/01/2019   TRIG 184 (H) 01/01/2019   CHOLHDL 4.7 01/01/2019  uncontrolled and not at goal, needs to change food choice     Overweight (BMI 25.0-29.9)  Patient re-educated about  the importance of commitment to a  minimum of 150 minutes of exercise per week as able.  The importance of healthy food choices with portion control discussed, as well as eating regularly and within a 12 hour window most days. The need to choose "clean , green" food 50 to 75% of the time is discussed, as well as to make water the primary drink and set a goal of 64 ounces water daily.    Weight /BMI 01/02/2019 12/31/2018 10/04/2018  WEIGHT 178 lb 181 lb 9.6 oz 179 lb  HEIGHT 5\' 5"  5\' 5"  5\' 5"   BMI 29.62 kg/m2 30.22 kg/m2 29.79 kg/m2

## 2019-01-03 ENCOUNTER — Ambulatory Visit (HOSPITAL_COMMUNITY)
Admission: RE | Admit: 2019-01-03 | Discharge: 2019-01-03 | Disposition: A | Discharge status: Home / Self Care | Payer: Medicare Other | Source: Ambulatory Visit | Attending: Internal Medicine | Admitting: Internal Medicine

## 2019-01-03 DIAGNOSIS — R0601 Orthopnea: Secondary | ICD-10-CM | POA: Insufficient documentation

## 2019-01-03 DIAGNOSIS — R0602 Shortness of breath: Secondary | ICD-10-CM | POA: Diagnosis not present

## 2019-01-03 DIAGNOSIS — I350 Nonrheumatic aortic (valve) stenosis: Secondary | ICD-10-CM | POA: Insufficient documentation

## 2019-01-03 NOTE — Progress Notes (Signed)
*  PRELIMINARY RESULTS* Echocardiogram 2D Echocardiogram has been performed.  Leavy Cella 01/03/2019, 10:42 AM

## 2019-01-04 ENCOUNTER — Other Ambulatory Visit: Payer: Self-pay

## 2019-01-04 ENCOUNTER — Encounter: Payer: Self-pay | Admitting: "Endocrinology

## 2019-01-04 ENCOUNTER — Ambulatory Visit (INDEPENDENT_AMBULATORY_CARE_PROVIDER_SITE_OTHER): Payer: Medicare Other | Admitting: "Endocrinology

## 2019-01-04 DIAGNOSIS — I10 Essential (primary) hypertension: Secondary | ICD-10-CM | POA: Diagnosis not present

## 2019-01-04 DIAGNOSIS — Z794 Long term (current) use of insulin: Secondary | ICD-10-CM | POA: Diagnosis not present

## 2019-01-04 DIAGNOSIS — N181 Chronic kidney disease, stage 1: Secondary | ICD-10-CM | POA: Diagnosis not present

## 2019-01-04 DIAGNOSIS — E782 Mixed hyperlipidemia: Secondary | ICD-10-CM

## 2019-01-04 DIAGNOSIS — E1122 Type 2 diabetes mellitus with diabetic chronic kidney disease: Secondary | ICD-10-CM

## 2019-01-04 MED ORDER — METFORMIN HCL 500 MG PO TABS: 500.0000 mg | ORAL_TABLET | Freq: Two times a day (BID) | ORAL | 2 refills | Status: DC

## 2019-01-04 MED ORDER — GLIPIZIDE ER 5 MG PO TB24: 5.0000 mg | ORAL_TABLET | Freq: Every day | ORAL | 3 refills | Status: DC

## 2019-01-04 NOTE — Progress Notes (Signed)
01/04/2019                                                    Endocrinology Telehealth Visit Follow up Note -During COVID -19 Pandemic  This visit type was conducted due to national recommendations for restrictions regarding the COVID-19 Pandemic  in an effort to limit this patient's exposure and mitigate transmission of the corona virus.  Due to her co-morbid illnesses, Joanna Brown is at  moderate to high risk for complications without adequate follow up.  This format is felt to be most appropriate for her at this time.  I connected with this patient on 01/04/2019   by telephone and verified that I am speaking with the correct person using two identifiers. [place], July 20, 1940. she has verbally consented to this visit. All issues noted in this document were discussed and addressed. The format was not optimal for physical exam.    Subjective:    Patient ID: Joanna Brown, female    DOB: 1940-09-08. Patient is being engaged in telehealth via telephone for follow-up for management of diabetes requested by  Fayrene Helper, MD  Past Medical History:  Diagnosis Date  . Blood transfusion    1980  . Diabetes mellitus   . Dysrhythmia   . Female bladder prolapse   . GERD (gastroesophageal reflux disease)   . Heart disease   . Hyperlipidemia   . Hypertension    echo and stress 4/10 reports on chart, EKG ` LOV 9/12 on chart  . Mild aortic stenosis    Past Surgical History:  Procedure Laterality Date  . ABDOMINAL HYSTERECTOMY    . ANTERIOR AND POSTERIOR REPAIR  04/26/2011   Procedure: ANTERIOR (CYSTOCELE) AND POSTERIOR REPAIR (RECTOCELE);  Surgeon: Reece Packer, MD;  Location: [facility];  Service: Urology;  Laterality: N/A;  . CATARACT EXTRACTION Bilateral    with IOL  . CHOLECYSTECTOMY  2009  . COLONOSCOPY N/A 12/02/2013   Procedure: COLONOSCOPY;  Surgeon: Danie Binder, MD;  Location: [facility];  Service: Endoscopy;  Laterality: N/A;  9:30 AM - rescheduled to 8:30 -  Doris to notify pt  . COLONOSCOPY W/ POLYPECTOMY    . LEFT HEART CATH  09/10/2008   normal coronary arteries, normal LV systolic function, EF 123456 (Dr. [name])  . LYMPH NODE DISSECTION Right 1997   under arm  . NM MYOCAR PERF WALL MOTION  2010   dipyridamole - mild-mod in intenstiy perfusion defect in mid anterior, mid anteroseptal wall, EF 70%  . OVARY SURGERY     bilateral tumors removed  . THYROIDECTOMY    . THYROIDECTOMY  02/2008  . TRANSTHORACIC ECHOCARDIOGRAM  08/2011   EF=>55%, mild conc LVH; trace MR; mild TR; mild-mod AV calcification with mild valvular AV stenosis  . VAGINAL PROLAPSE REPAIR  04/26/2011   Procedure: VAGINAL VAULT SUSPENSION;  Surgeon: Reece Packer, MD;  Location: [facility];  Service: Urology;  Laterality: N/A;  with Graft  10x6   Social History   Socioeconomic History  . Marital status: Married    Spouse name: [name]  . Number of children: 1  . Years of education: Trade  . Highest education level: 12th grade  Occupational History  . Occupation: Retired  Scientific laboratory technician  . Financial resource strain: Not very hard  . Food insecurity  Worry: Never true    Inability: Never true  . Transportation needs    Medical: No    Non-medical: No  Tobacco Use  . Smoking status: Never Smoker  . Smokeless tobacco: Never Used  Substance and Sexual Activity  . Alcohol use: No    Comment: socially- none x 30 years  . Drug use: No  . Sexual activity: Yes  Lifestyle  . Physical activity    Days per week: 3 days    Minutes per session: 40 min  . Stress: Only a little  Relationships  . Social Herbalist on phone: Twice a week    Gets together: Twice a week    Attends religious service: More than 4 times per year    Active member of club or organization: Yes    Attends meetings of clubs or organizations: More than 4 times per year    Relationship status: Married  Other Topics Concern  . Not on file  Social History Narrative   Patient lives at  home with spouse. 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 LIVED IN [place] FOR 50 YRS AND CAME BACK TO [place] ~2009. Caffeine Use: 1 cup of coffee daily. HAD ONE CHILD: PASSED 5 YRS. HAVE THREE GRAND-KIDS AND TWO GREAT GRANDS.    Outpatient Encounter Medications as of 01/04/2019  Medication Sig  . amLODipine (NORVASC) 10 MG tablet TAKE 1 TABLET(10 MG) BY MOUTH EVERY MORNING  . aspirin 81 MG tablet Take 81 mg by mouth every morning.   . B-D ULTRAFINE III SHORT PEN 31G X 8 MM MISC USE AS DIRECTED AT BEDTIME WITH INSULIN PENS  . benazepril (LOTENSIN) 40 MG tablet TAKE 1 TABLET(40 MG) BY MOUTH DAILY  . Calcium Carbonate-Vit D-Min (CALCIUM 1200) 1200-1000 MG-UNIT CHEW Chew 1 each by mouth daily.  . cholecalciferol (VITAMIN D3) 25 MCG (1000 UT) tablet Take 1,000 Units by mouth daily.  Marland Kitchen ezetimibe (ZETIA) 10 MG tablet Take 10 mg by mouth daily.  . ferrous sulfate 325 (65 FE) MG tablet Take 325 mg by mouth daily with breakfast.  . glipiZIDE (GLUCOTROL XL) 5 MG 24 hr tablet Take 1 tablet (5 mg total) by mouth daily with breakfast.  . glucose blood (TRUE METRIX BLOOD GLUCOSE TEST) test strip Test BG 4 x daily E11.65  . LEVEMIR FLEXTOUCH 100 UNIT/ML Pen INJECT 70 UNITS INTO THE SKIN AT BEDTIME.  . metFORMIN (GLUCOPHAGE) 500 MG tablet Take 1 tablet (500 mg total) by mouth 2 (two) times daily with a meal.  . Multiple Vitamins-Minerals (CENTRUM) tablet Take 1 tablet by mouth every morning.   . Nutritional Supplements (NUTRITIONAL SUPPLEMENT PO) Take by mouth. Neurx-TF (formulation for peripheral nerve health)  . Omega-3 Fatty Acids (FISH OIL) 1200 MG CAPS Take 1 capsule by mouth every morning.   . pravastatin (PRAVACHOL) 80 MG tablet TAKE 1 TABLET(80 MG) BY MOUTH DAILY  . Travoprost, BAK Free, (TRAVATAN) 0.004 % SOLN ophthalmic solution Place 1 drop into both eyes at bedtime.   . triamterene-hydrochlorothiazide (MAXZIDE) 75-50 MG tablet TAKE 1 TABLET BY MOUTH DAILY  . UNABLE TO FIND  Diabetic shoes x 1  Inserts x 3  Dx E11.9  . [DISCONTINUED] metFORMIN (GLUCOPHAGE) 500 MG tablet TAKE 1 TABLET BY MOUTH EVERY DAY WITH BREAKFAST   No facility-administered encounter medications on file as of 01/04/2019.    ALLERGIES: Allergies  Allergen Reactions  . Senokot Wheat Bran [Wheat Bran]     ABDOMINAL CRAMPS  .  Spironolactone     Stomach problems, vision changes    VACCINATION STATUS: Immunization History  Administered Date(s) Administered  . Pneumococcal Conjugate-13 12/11/2013  . Pneumococcal Polysaccharide-23 01/13/2010  . Tdap 10/05/2010    Diabetes She presents for her follow-up diabetic visit. She has type 2 diabetes mellitus. Onset time: She was diagnosed at approximate age of 37 years. Her disease course has been worsening. There are no hypoglycemic associated symptoms. Pertinent negatives for hypoglycemia include no confusion, headaches, pallor or seizures. Associated symptoms include polydipsia and polyuria. Pertinent negatives for diabetes include no blurred vision, no chest pain, no fatigue and no polyphagia. There are no hypoglycemic complications. Symptoms are worsening. Diabetic complications include nephropathy and retinopathy. Risk factors for coronary artery disease include dyslipidemia, diabetes mellitus, obesity and sedentary lifestyle. Her weight is fluctuating minimally. She is following a generally unhealthy diet. When asked about meal planning, she reported none. She has had a previous visit with a dietitian. She rarely participates in exercise. Her breakfast blood glucose range is generally 140-180 mg/dl. Her bedtime blood glucose range is generally 180-200 mg/dl. Her overall blood glucose range is 180-200 mg/dl. Eye exam is current.  Hyperlipidemia This is a chronic problem. The current episode started more than 1 year ago. The problem is uncontrolled. Exacerbating diseases include diabetes and obesity. Pertinent negatives include no chest pain, myalgias  or shortness of breath. Current antihyperlipidemic treatment includes statins. Risk factors for coronary artery disease include dyslipidemia, diabetes mellitus, hypertension, obesity, a sedentary lifestyle and post-menopausal.  Hypertension This is a chronic problem. The current episode started more than 1 year ago. Pertinent negatives include no blurred vision, chest pain, headaches, palpitations or shortness of breath. Risk factors for coronary artery disease include dyslipidemia, diabetes mellitus, obesity and sedentary lifestyle. Hypertensive end-organ damage includes retinopathy.    Review of Systems  Constitutional: Negative for chills, fatigue, fever and unexpected weight change.  HENT: Negative for trouble swallowing and voice change.   Eyes: Negative for blurred vision and visual disturbance.  Respiratory: Negative for cough, shortness of breath and wheezing.   Cardiovascular: Negative for chest pain, palpitations and leg swelling.  Gastrointestinal: Negative for diarrhea, nausea and vomiting.  Endocrine: Positive for polydipsia and polyuria. Negative for cold intolerance, heat intolerance and polyphagia.  Musculoskeletal: Negative for arthralgias and myalgias.  Skin: Negative for color change, pallor, rash and wound.  Neurological: Negative for seizures and headaches.  Psychiatric/Behavioral: Negative for confusion and suicidal ideas.    Objective:    There were no vitals taken for this visit.  Wt Readings from Last 3 Encounters:  01/02/19 178 lb (80.7 kg)  12/31/18 181 lb 9.6 oz (82.4 kg)  10/04/18 179 lb (81.2 kg)     CMP ( most recent) CMP     Component Value Date/Time   NA 135 01/01/2019 0741   NA 135 12/31/2018 0915   K 3.9 01/01/2019 0741   CL 96 (L) 01/01/2019 0741   CO2 30 01/01/2019 0741   GLUCOSE 130 (H) 01/01/2019 0741   BUN 13 01/01/2019 0741   BUN 13 12/31/2018 0915   CREATININE 0.96 (H) 01/01/2019 0741   CALCIUM 9.8 01/01/2019 0741   PROT 6.9  01/01/2019 0741   PROT 7.4 12/31/2018 0915   ALBUMIN 4.6 12/31/2018 0915   AST 22 01/01/2019 0741   ALT 24 01/01/2019 0741   ALKPHOS 57 12/31/2018 0915   BILITOT 0.4 01/01/2019 0741   BILITOT 0.2 12/31/2018 0915   GFRNONAA 57 (L) 01/01/2019 0741   GFRAA  66 01/01/2019 0741    Diabetic Labs (most recent): Lab Results  Component Value Date   HGBA1C 8.9 (H) 01/01/2019   HGBA1C 8.8 (H) 09/28/2018   HGBA1C 9.4 (H) 06/28/2018    Lipid Panel     Component Value Date/Time   CHOL 175 01/01/2019 0738   TRIG 184 (H) 01/01/2019 0738   HDL 37 (L) 01/01/2019 0738   CHOLHDL 4.7 01/01/2019 0738   VLDL 39 12/05/2017 0832   LDLCALC 107 (H) 01/01/2019 0738     Assessment & Plan:   1. Type 2 diabetes mellitus with stage 1 chronic kidney disease, with long-term current use of insulin ([place])  - Patient has currently uncontrolled symptomatic type 2 DM since  78 years of age. - She reports controlled fasting glycemic profile, above target postprandial glycemic profile.  Her previsit labs show A1c of 8.9% unchanged from her last visit.    Recent labs reviewed, showing improving renal function.     Her diabetes is complicated by CKD obesity/sedentary life and patient remains at a high risk for more acute and chronic complications of diabetes which include CAD, CVA, CKD, retinopathy, and neuropathy. These are all discussed in detail with the patient.  - I have counseled the patient on diet management and weight loss, by adopting a carbohydrate restricted/protein rich diet.  -She still admits to dietary indiscretions including consumption of sweets and sweetened beverages.  - she  admits there is a room for improvement in her diet and drink choices. -  Suggestion is made for her to avoid simple carbohydrates  from her diet including Cakes, Sweet Desserts / Pastries, Ice Cream, Soda (diet and regular), Sweet Tea, Candies, Chips, Cookies, Sweet Pastries,  Store Bought Juices, Alcohol in Excess of  1-2  drinks a day, Artificial Sweeteners, Coffee Creamer, and "Sugar-free" Products. This will help patient to have stable blood glucose profile and potentially avoid unintended weight gain.   - I encouraged the patient to switch to  unprocessed or minimally processed complex starch and increased protein intake (animal or plant source), fruits, and vegetables.  - Patient is advised to stick to a routine mealtimes to eat 3 meals  a day and avoid unnecessary snacks ( to snack only to correct hypoglycemia).   - I have approached patient with the following individualized plan to manage diabetes and patient agrees:   -She is advised to continue Levemir 80 units nightly,  associated with strict monitoring of blood glucose 2 times a day-daily before breakfast and at bedtime.    -She is hesitant to add prandial insulin at this time.   She will benefit from low-dose glipizide.  I discussed and added glipizide 5 mg XL p.o. daily at breakfast.  -Patient is encouraged to call clinic for blood glucose levels less than 70 or above 200 mg /dl. -She is advised to continue metformin  500 mg by mouth twice a day, therapeutically suitable for patient. -Patient is not a candidate for  SGLT2 inhibitors due to CKD. -If her A1c remains above  9% on next visit, she will be reconsidered for  prandial insulin in addition to her basal insulin.  - Patient specific target  A1c;  LDL, HDL, Triglycerides, and  Waist Circumference were discussed in detail.  2) BP/HTN: she is advised to home monitor blood pressure and report if > 140/90 on 2 separate readings.   She is advised to continue her current blood pressure medications including benazepril 40 mg p.o. daily.   3) Lipids/HPL:  Her recent lipid panel showed uncontrolled LDL at 97.  She is advised to continue pravastatin 80 mg p.o. nightly.     4)  Weight/Diet: CDE Consult has been initiated , exercise, and detailed carbohydrates information provided.  5) Chronic  Care/Health Maintenance:  -Patient is on ACEI/ARB and Statin medications and encouraged to continue to follow up with Ophthalmology, Podiatrist at least yearly or according to recommendations, and advised to  stay away from smoking. I have recommended yearly flu vaccine and pneumonia vaccination at least every 5 years; moderate intensity exercise for up to 150 minutes weekly; and  sleep for at least 7 hours a day.   - I advised patient to maintain close follow up with Fayrene Helper, MD for primary care needs.  - Patient Care Time Today:  25 min, of which >50% was spent in  counseling and the rest reviewing her  current and  previous labs/studies, previous treatments, her blood glucose readings, and medications' doses and developing a plan for long-term care based on the latest recommendations for standards of care.   Derinda Late Behan participated in the discussions, expressed understanding, and voiced agreement with the above plans.  All questions were answered to her satisfaction. she is encouraged to contact clinic should she have any questions or concerns prior to her return visit.   Follow up plan: - Return in about 4 months (around 05/06/2019) for Bring Meter and Logs- A1c in Office.  Glade Lloyd, MD Phone: [phone]  Fax: [phone]   This note was partially dictated with voice recognition software. Similar sounding words can be transcribed inadequately or may not  be corrected upon review.  01/04/2019, 11:33 AM

## 2019-01-06 ENCOUNTER — Encounter: Payer: Self-pay | Admitting: Family Medicine

## 2019-01-06 NOTE — Assessment & Plan Note (Signed)
  Patient re-educated about  the importance of commitment to a  minimum of 150 minutes of exercise per week as able.  The importance of healthy food choices with portion control discussed, as well as eating regularly and within a 12 hour window most days. The need to choose "clean , green" food 50 to 75% of the time is discussed, as well as to make water the primary drink and set a goal of 64 ounces water daily.    Weight /BMI 01/02/2019 12/31/2018 10/04/2018  WEIGHT 178 lb 181 lb 9.6 oz 179 lb  HEIGHT 5\' 5"  5\' 5"  5\' 5"   BMI 29.62 kg/m2 30.22 kg/m2 29.79 kg/m2

## 2019-01-06 NOTE — Assessment & Plan Note (Signed)
Controlled, no change in medication DASH diet and commitment to daily physical activity for a minimum of 30 minutes discussed and encouraged, as a part of hypertension management. The importance of attaining a healthy weight is also discussed.  BP/Weight 01/02/2019 12/31/2018 10/04/2018 08/01/2018 07/23/2018 07/05/2018 [id number]  Systolic BP Q000111Q 123456 Q000111Q 123456 [id number]  Diastolic BP 78 80 79 70 71 79 68  Wt. (Lbs) 178 181.6 179 179 178 178 179  BMI 29.62 30.22 29.79 29.79 29.62 29.62 29.79

## 2019-01-06 NOTE — Assessment & Plan Note (Signed)
Hyperlipidemia:Low fat diet discussed and encouraged.   Lipid Panel  Lab Results  Component Value Date   CHOL 175 01/01/2019   HDL 37 (L) 01/01/2019   LDLCALC 107 (H) 01/01/2019   TRIG 184 (H) 01/01/2019   CHOLHDL 4.7 01/01/2019  uncontrolled and not at goal, needs to change food choice

## 2019-01-06 NOTE — Assessment & Plan Note (Signed)
Uncontrolled , n managed by emdo Joanna Brown is reminded of the importance of commitment to daily physical activity for 30 minutes or more, as able and the need to limit carbohydrate intake to 30 to 60 grams per meal to help with blood sugar control.   The need to take medication as prescribed, test blood sugar as directed, and to call between visits if there is a concern that blood sugar is uncontrolled is also discussed.   Joanna Brown is reminded of the importance of daily foot exam, annual eye examination, and good blood sugar, blood pressure and cholesterol control.  Diabetic Labs Latest Ref Rng & Units 01/01/2019 12/31/2018 09/28/2018 06/28/2018 06/27/2018  HbA1c <5.7 % of total Hgb 8.9(H) - 8.8(H) 9.4(H) -  Microalbumin mg/dL - - - 11.5 -  Micro/Creat Ratio <30 mcg/mg creat - - - 102(H) -  Chol <200 mg/dL 175 - - - 145  HDL > OR = 50 mg/dL 37(L) - - - 41(L)  Calc LDL mg/dL (calc) 107(H) - - - 73  Triglycerides <150 mg/dL 184(H) - - - 225(H)  Creatinine 0.60 - 0.93 mg/dL 0.96(H) 0.92 1.03(H) 1.12(H) 1.04(H)   BP/Weight 01/02/2019 12/31/2018 10/04/2018 08/01/2018 07/23/2018 07/05/2018 [id number]  Systolic BP Q000111Q 123456 Q000111Q 123456 [id number]  Diastolic BP 78 80 79 70 71 79 68  Wt. (Lbs) 178 181.6 179 179 178 178 179  BMI 29.62 30.22 29.79 29.79 29.62 29.62 29.79   Foot/eye exam completion dates Latest Ref Rng & Units 09/25/2018 04/02/2018  Eye Exam No Retinopathy No Retinopathy -  Foot Form Completion - - Done

## 2019-01-29 ENCOUNTER — Other Ambulatory Visit: Payer: Self-pay

## 2019-01-29 ENCOUNTER — Encounter: Payer: Self-pay | Admitting: Physician Assistant

## 2019-01-29 ENCOUNTER — Ambulatory Visit (INDEPENDENT_AMBULATORY_CARE_PROVIDER_SITE_OTHER): Payer: Medicare Other | Admitting: General Practice

## 2019-01-29 VITALS — BP 124/58 | HR 71 | Temp 97.3°F | Ht 65.0 in | Wt 181.0 lb

## 2019-01-29 DIAGNOSIS — E782 Mixed hyperlipidemia: Secondary | ICD-10-CM | POA: Diagnosis not present

## 2019-01-29 DIAGNOSIS — E1122 Type 2 diabetes mellitus with diabetic chronic kidney disease: Secondary | ICD-10-CM | POA: Diagnosis not present

## 2019-01-29 DIAGNOSIS — Z794 Long term (current) use of insulin: Secondary | ICD-10-CM | POA: Diagnosis not present

## 2019-01-29 DIAGNOSIS — I10 Essential (primary) hypertension: Secondary | ICD-10-CM | POA: Diagnosis not present

## 2019-01-29 DIAGNOSIS — N181 Chronic kidney disease, stage 1: Secondary | ICD-10-CM

## 2019-01-29 DIAGNOSIS — I350 Nonrheumatic aortic (valve) stenosis: Secondary | ICD-10-CM | POA: Diagnosis not present

## 2019-01-29 MED ORDER — EZETIMIBE 10 MG PO TABS: 10.0000 mg | ORAL_TABLET | Freq: Every day | ORAL | 3 refills | Status: DC

## 2019-01-29 NOTE — Patient Instructions (Addendum)
Medication Instructions:  Your physician recommends that you continue on your current medications as directed. Please refer to the Current Medication list given to you today.  If you need a refill on your cardiac medications before your next appointment, please call your pharmacy.   Lab work: NONE ordered at this time of appointment   If you have labs (blood work) drawn today and your tests are completely normal, you will receive your results only by: Marland Kitchen MyChart Message (if you have MyChart) OR . A paper copy in the mail If you have any lab test that is abnormal or we need to change your treatment, we will call you to review the results.  Testing/Procedures: NONE ordered at this time of appointment   Follow-Up: At [facility], you and your health needs are our priority.  As part of our continuing mission to provide you with exceptional heart care, we have created designated Provider Care Teams.  These Care Teams include your primary Cardiologist (physician) and Advanced Practice Providers (APPs -  Physician Assistants and Nurse Practitioners) who all work together to provide you with the care you need, when you need it. You will need a follow up appointment in 3 months with [name], MD or one of the following Advanced Practice Providers on your designated Care Team: [place], [place] . Fabian Sharp, PA-C  Any Other Special Instructions Will Be Listed Below (If Applicable).

## 2019-01-29 NOTE — Progress Notes (Signed)
Cardiology Clinic Note   Patient Name: Joanna Brown Date of Encounter: 01/29/2019  Primary Care Provider:  Fayrene Helper, MD Primary Cardiologist:  Joanna Casino, MD  Patient Profile    Joanna Brown 78 year old female presents for follow-up of her hypertension, hyperlipidemia, and carotid bruit.  Past Medical History    Past Medical History:  Diagnosis Date  . Blood transfusion    1980  . Diabetes mellitus   . Dysrhythmia   . Female bladder prolapse   . GERD (gastroesophageal reflux disease)   . Heart disease   . Hyperlipidemia   . Hypertension    echo and stress 4/10 reports on chart, EKG ` LOV 9/12 on chart  . Mild aortic stenosis    Past Surgical History:  Procedure Laterality Date  . ABDOMINAL HYSTERECTOMY    . ANTERIOR AND POSTERIOR REPAIR  04/26/2011   Procedure: ANTERIOR (CYSTOCELE) AND POSTERIOR REPAIR (RECTOCELE);  Surgeon: Joanna Packer, MD;  Location: [facility];  Service: Urology;  Laterality: N/A;  . CATARACT EXTRACTION Bilateral    with IOL  . CHOLECYSTECTOMY  2009  . COLONOSCOPY N/A 12/02/2013   Procedure: COLONOSCOPY;  Surgeon: Joanna Binder, MD;  Location: [facility];  Service: Endoscopy;  Laterality: N/A;  9:30 AM - rescheduled to 8:30 - Doris to notify pt  . COLONOSCOPY W/ POLYPECTOMY    . LEFT HEART CATH  09/10/2008   normal coronary arteries, normal LV systolic function, EF 123456 (Dr. Norlene Brown)  . LYMPH NODE DISSECTION Right 1997   under arm  . NM MYOCAR PERF WALL MOTION  2010   dipyridamole - mild-mod in intenstiy perfusion defect in mid anterior, mid anteroseptal wall, EF 70%  . OVARY SURGERY     bilateral tumors removed  . THYROIDECTOMY    . THYROIDECTOMY  02/2008  . TRANSTHORACIC ECHOCARDIOGRAM  08/2011   EF=>55%, mild conc LVH; trace MR; mild TR; mild-mod AV calcification with mild valvular AV stenosis  . VAGINAL PROLAPSE REPAIR  04/26/2011   Procedure: VAGINAL VAULT SUSPENSION;  Surgeon: Joanna Packer, MD;   Location: [facility];  Service: Urology;  Laterality: N/A;  with Graft  10x6    Allergies  Allergies  Allergen Reactions  . Senokot Wheat Bran [Wheat Bran]     ABDOMINAL CRAMPS  . Spironolactone     Stomach problems, vision changes     History of Present Illness    [name] was last seen by Dr. Debara Brown on 12/31/2018.  During that time she was experiencing worsening shortness of breath, mainly at night as she was laying down.  She had a mild nonproductive cough and also had occasional lower extremity edema.  She also noticed that she was short of breath with exertion however, she denied chest pain, pressure, heaviness, and other anginal symptoms.  Her EKG at that time showed an incomplete right bundle branch block.  Her A1c was recently drawn and was 8.8.  Her recent cholesterol panel in February showed a total cholesterol of 145, triglycerides 225, HDL 41 and LDL 73.  She also has known aortic stenosis and when it was last reviewed via echo in 2018 showed moderate aortic stenosis.  A repeat echo and BNP were ordered at that time.    Her echocardiogram from 01/03/2019 showed LVEF of 50 to 55% and moderate to severe aortic stenosis with a mean gradient of 21 mmHg  Her PMH also includes essential hypertension, dyslipidemia, moderate aortic stenosis, GERD, type 2 diabetes,  irritable bowel syndrome, and she is overweight.  She presents the clinic today and states she has still been walking about one half of a mile every other day.  However, about 6 months ago she feels she was able to walk 1 mile every other day.  She is still able to do all of her daily activities but has noticed an increasing shortness of breath.  Also states that for the last 6 months she has used 3 pillows behind her head for comfortable sleeping.  Today she states she feels that her shortness of breath has not increased since she was seen 1 month ago.  She denies chest pain,  lower extremity edema, fatigue, palpitations, melena,  hematuria, hemoptysis, diaphoresis, weakness, presyncope, syncope, orthopnea, and PND.   Home Medications    Prior to Admission medications   Medication Sig Start Date End Date Taking? Authorizing Provider  amLODipine (NORVASC) 10 MG tablet TAKE 1 TABLET(10 MG) BY MOUTH EVERY MORNING 08/07/18   Joanna Helper, MD  aspirin 81 MG tablet Take 81 mg by mouth every morning.     [provider]  B-D ULTRAFINE III SHORT PEN 31G X 8 MM MISC USE AS DIRECTED AT BEDTIME WITH INSULIN PENS 10/24/18   Joanna Anger, MD  benazepril (LOTENSIN) 40 MG tablet TAKE 1 TABLET(40 MG) BY MOUTH DAILY 09/10/18   Joanna Helper, MD  Calcium Carbonate-Vit D-Min (CALCIUM 1200) 1200-1000 MG-UNIT CHEW Chew 1 each by mouth daily.    [provider]  cholecalciferol (VITAMIN D3) 25 MCG (1000 UT) tablet Take 1,000 Units by mouth daily.    [provider]  ezetimibe (ZETIA) 10 MG tablet Take 10 mg by mouth daily.    [provider]  ferrous sulfate 325 (65 FE) MG tablet Take 325 mg by mouth daily with breakfast.    [provider]  glipiZIDE (GLUCOTROL XL) 5 MG 24 hr tablet Take 1 tablet (5 mg total) by mouth daily with breakfast. 01/04/19   Joanna Anger, MD  glucose blood (TRUE METRIX BLOOD GLUCOSE TEST) test strip Test BG 4 x daily E11.65 10/24/17   Joanna Anger, MD  LEVEMIR FLEXTOUCH 100 UNIT/ML Pen INJECT 70 UNITS INTO THE SKIN AT BEDTIME. 12/31/18   Joanna Brown, Joanna Chimes, MD  metFORMIN (GLUCOPHAGE) 500 MG tablet Take 1 tablet (500 mg total) by mouth 2 (two) times daily with a meal. 01/04/19   Joanna Brown, Joanna Chimes, MD  Multiple Vitamins-Minerals (CENTRUM) tablet Take 1 tablet by mouth every morning.     [provider]  Nutritional Supplements (NUTRITIONAL SUPPLEMENT PO) Take by mouth. Neurx-TF (formulation for peripheral nerve health)    [provider]  Omega-3 Fatty Acids (FISH OIL) 1200 MG CAPS Take 1 capsule by mouth every  morning.     [provider]  pravastatin (PRAVACHOL) 80 MG tablet TAKE 1 TABLET(80 MG) BY MOUTH DAILY 11/15/18   Joanna Helper, MD  Travoprost, BAK Free, (TRAVATAN) 0.004 % SOLN ophthalmic solution Place 1 drop into both eyes at bedtime.     [provider]  triamterene-hydrochlorothiazide (MAXZIDE) 75-50 MG tablet TAKE 1 TABLET BY MOUTH DAILY 09/10/18   Joanna Helper, MD  UNABLE TO FIND Diabetic shoes x 1  Inserts x 3  Dx E11.9 04/03/18   Joanna Helper, MD    Family History    Family History  Problem Relation Age of Onset  . Stomach cancer Mother 29  . Heart disease Mother 55  heart disease  . Heart disease Father 71       MI  . Stroke Maternal Grandfather   . Heart attack Paternal Grandfather   . Hypertension Brother   . Bone cancer Brother   . Hypertension Sister   . Liver cancer Sister   . Hypertension Sister   . Hypertension Child    She indicated that her mother is deceased. She indicated that her father is deceased. She indicated that three of her five sisters are alive. She indicated that her brother is deceased. She indicated that her maternal grandmother is deceased. She indicated that her maternal grandfather is deceased. She indicated that her paternal grandmother is deceased. She indicated that her paternal grandfather is deceased. She indicated that her son is deceased. She indicated that the status of her child is unknown.  Social History    Social History   Socioeconomic History  . Marital status: Married    Spouse name: [name]  . Number of children: 1  . Years of education: Trade  . Highest education level: 12th grade  Occupational History  . Occupation: Retired  Scientific laboratory technician  . Financial resource strain: Not very hard  . Food insecurity    Worry: Never true    Inability: Never true  . Transportation needs    Medical: No    Non-medical: No  Tobacco Use  . Smoking status: Never Smoker  . Smokeless tobacco:  Never Used  Substance and Sexual Activity  . Alcohol use: No    Comment: socially- none x 30 years  . Drug use: No  . Sexual activity: Yes  Lifestyle  . Physical activity    Days per week: 3 days    Minutes per session: 40 min  . Stress: Only a little  Relationships  . Social Herbalist on phone: Twice a week    Gets together: Twice a week    Attends religious service: More than 4 times per year    Active member of club or organization: Yes    Attends meetings of clubs or organizations: More than 4 times per year    Relationship status: Married  . Intimate partner violence    Fear of current or ex partner: No    Emotionally abused: No    Physically abused: No    Forced sexual activity: No  Other Topics Concern  . Not on file  Social History Narrative   Patient lives at home with spouse. 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 LIVED IN [place] FOR 50 YRS AND CAME BACK TO [place] ~2009. Caffeine Use: 1 cup of coffee daily. HAD ONE CHILD: PASSED 5 YRS. HAVE THREE GRAND-KIDS AND TWO GREAT GRANDS.      Review of Systems    General:  No chills, fever, night sweats or weight changes.  Cardiovascular:  No chest pain, dyspnea on exertion, edema, orthopnea, palpitations, paroxysmal nocturnal dyspnea. Dermatological: No rash, lesions/masses Respiratory: No cough, dyspnea Urologic: No hematuria, dysuria Abdominal:   No nausea, vomiting, diarrhea, bright red blood per rectum, melena, or hematemesis Neurologic:  No visual changes, wkns, changes in mental status. All other systems reviewed and are otherwise negative except as noted above.  Physical Exam    VS:  BP (!) 124/58   Pulse 71   Temp (!) 97.3 F (36.3 C) (Temporal)   Ht 5\' 5"  (1.651 m)   Wt 181 lb (82.1 kg)   SpO2 97%   BMI 30.12 kg/m  ,  BMI Body mass index is 30.12 kg/m. GEN: Well nourished, well developed, in no acute distress. HEENT: normal. Neck: Supple, no JVD, carotid bruits, or  masses. Cardiac: RRR, no murmurs, rubs, or gallops. No clubbing, cyanosis, edema.  Radials/DP/PT 2+ and equal bilaterally.  Respiratory:  Respirations regular and unlabored, clear to auscultation bilaterally. GI: Soft, nontender, nondistended, BS + x 4. MS: no deformity or atrophy. Skin: warm and dry, no rash. Neuro:  Strength and sensation are intact. Psych: Normal affect.  Accessory Clinical Findings    ECG personally reviewed by me today-none today  EKG 12/31/2018 Normal sinus rhythm with left axis deviation 67 bpm  Echocardiogram 01/03/2019 IMPRESSIONS   1. The left ventricle has low normal systolic function, with an ejection fraction of 50-55%. The cavity size was normal. There is moderately increased left ventricular wall thickness. Left ventricular diastolic Doppler parameters are consistent with  impaired relaxation.  2. The right ventricle has normal systolic function. The cavity was normal. There is no increase in right ventricular wall thickness.  3. The aortic valve has an indeterminate number of cusps. Moderate thickening of the aortic valve. Moderate calcification of the aortic valve. Moderate to severe aortic stenosis. Moderate mean gradient of 21 mmHg, AVA VTI 1.00, dimensionless index 0.21.  Low stroke volume index, lower than expected gradient likely due to paradoxical low flow low gradient aortic stenosis of the aortic valve. Moderate aortic annular calcification noted.  4. The mitral valve is abnormal. Mild thickening of the mitral valve leaflet. Mild calcification of the mitral valve leaflet. There is moderate mitral annular calcification present. No evidence of mitral valve stenosis.  5. The aorta is normal unless otherwise noted.  Assessment & Plan   1.  Aortic stenosis- echocardiogram 01/03/2019 shows moderate to severe aortic stenosis with a mean gradient of 21 mmHg lower than expected gradient likely due to paradoxical low flow.  With moderate aortic annular  calcification.  At this time she is stable and walking half mile every other day.  She is able to do all of her daily activities but has noticed increased shortness of breath over the last 6 to 8 months.  Case discussed with Dr. Debara Brown, continue to monitor at this time.   2.  Essential hypertension-BP today 124/58 well-controlled at home Continue amlodipine 10 mg tablet daily Continue benazepril 40 mg tablet daily Continue Maxide 75-50 mg tablet daily Heart healthy low-sodium diet Maintain physical activity  3.  Hyperlipidemia-01/01/2019: Cholesterol 175; HDL 37; LDL Cholesterol (Calc) 107; Triglycerides 184 Continue pravastatin 80 mg tablet daily Heart healthy/carb modified diet Maintain physical activity as tolerated Continue Zetia 10 mg tablet daily  4.  Diabetes mellitus type 2-most recent A1c 8.9 01/01/2019, mean plasma glucose 209. Continue glipizide 5 mg tablet with breakfast Continue metformin 500 mg tablet twice daily Continue Levemir 70 units at bedtime Monitored by PCP Heart healthy/carb modified diet Increase physical activity as tolerated  Disposition: Follow-up with Dr. Debara Brown in 3 months.  Deberah Pelton, NP-C 01/29/2019, 9:27 AM

## 2019-01-31 ENCOUNTER — Ambulatory Visit: Payer: Medicare Other | Admitting: Family Medicine

## 2019-02-17 ENCOUNTER — Other Ambulatory Visit: Payer: Self-pay | Admitting: Family Medicine

## 2019-02-18 DIAGNOSIS — H401131 Primary open-angle glaucoma, bilateral, mild stage: Secondary | ICD-10-CM | POA: Diagnosis not present

## 2019-03-01 ENCOUNTER — Ambulatory Visit (INDEPENDENT_AMBULATORY_CARE_PROVIDER_SITE_OTHER): Payer: Medicare Other | Admitting: Podiatry

## 2019-03-01 ENCOUNTER — Other Ambulatory Visit: Payer: Self-pay

## 2019-03-01 ENCOUNTER — Encounter: Payer: Self-pay | Admitting: Podiatry

## 2019-03-01 VITALS — BP 142/81 | HR 74

## 2019-03-01 DIAGNOSIS — E1142 Type 2 diabetes mellitus with diabetic polyneuropathy: Secondary | ICD-10-CM

## 2019-03-01 DIAGNOSIS — M2042 Other hammer toe(s) (acquired), left foot: Secondary | ICD-10-CM

## 2019-03-01 DIAGNOSIS — B351 Tinea unguium: Secondary | ICD-10-CM

## 2019-03-01 DIAGNOSIS — M79674 Pain in right toe(s): Secondary | ICD-10-CM

## 2019-03-01 DIAGNOSIS — M2011 Hallux valgus (acquired), right foot: Secondary | ICD-10-CM

## 2019-03-01 DIAGNOSIS — M79675 Pain in left toe(s): Secondary | ICD-10-CM | POA: Diagnosis not present

## 2019-03-01 DIAGNOSIS — M2012 Hallux valgus (acquired), left foot: Secondary | ICD-10-CM | POA: Diagnosis not present

## 2019-03-01 DIAGNOSIS — E119 Type 2 diabetes mellitus without complications: Secondary | ICD-10-CM

## 2019-03-01 DIAGNOSIS — M2041 Other hammer toe(s) (acquired), right foot: Secondary | ICD-10-CM

## 2019-03-01 MED ORDER — NONFORMULARY OR COMPOUNDED ITEM: 3 refills | Status: DC

## 2019-03-01 NOTE — Patient Instructions (Signed)
Diabetes Mellitus and Foot Care Foot care is an important part of your health, especially when you have diabetes. Diabetes may cause you to have problems because of poor blood flow (circulation) to your feet and legs, which can cause your skin to:  Become thinner and drier.  Break more easily.  Heal more slowly.  Peel and crack. You may also have nerve damage (neuropathy) in your legs and feet, causing decreased feeling in them. This means that you may not notice minor injuries to your feet that could lead to more serious problems. Noticing and addressing any potential problems early is the best way to prevent future foot problems. How to care for your feet Foot hygiene  Wash your feet daily with warm water and mild soap. Do not use hot water. Then, pat your feet and the areas between your toes until they are completely dry. Do not soak your feet as this can dry your skin.  Trim your toenails straight across. Do not dig under them or around the cuticle. File the edges of your nails with an emery board or nail file.  Apply a moisturizing lotion or petroleum jelly to the skin on your feet and to dry, brittle toenails. Use lotion that does not contain alcohol and is unscented. Do not apply lotion between your toes. Shoes and socks  Wear clean socks or stockings every day. Make sure they are not too tight. Do not wear knee-high stockings since they may decrease blood flow to your legs.  Wear shoes that fit properly and have enough cushioning. Always look in your shoes before you put them on to be sure there are no objects inside.  To break in new shoes, wear them for just a few hours a day. This prevents injuries on your feet. Wounds, scrapes, corns, and calluses  Check your feet daily for blisters, cuts, bruises, sores, and redness. If you cannot see the bottom of your feet, use a mirror or ask someone for help.  Do not cut corns or calluses or try to remove them with medicine.  If you  find a minor scrape, cut, or break in the skin on your feet, keep it and the skin around it clean and dry. You may clean these areas with mild soap and water. Do not clean the area with peroxide, alcohol, or iodine.  If you have a wound, scrape, corn, or callus on your foot, look at it several times a day to make sure it is healing and not infected. Check for: ? Redness, swelling, or pain. ? Fluid or blood. ? Warmth. ? Pus or a bad smell. General instructions  Do not cross your legs. This may decrease blood flow to your feet.  Do not use heating pads or hot water bottles on your feet. They may burn your skin. If you have lost feeling in your feet or legs, you may not know this is happening until it is too late.  Protect your feet from hot and cold by wearing shoes, such as at the beach or on hot pavement.  Schedule a complete foot exam at least once a year (annually) or more often if you have foot problems. If you have foot problems, report any cuts, sores, or bruises to your health care provider immediately. Contact a health care provider if:  You have a medical condition that increases your risk of infection and you have any cuts, sores, or bruises on your feet.  You have an injury that is not   healing.  You have redness on your legs or feet.  You feel burning or tingling in your legs or feet.  You have pain or cramps in your legs and feet.  Your legs or feet are numb.  Your feet always feel cold.  You have pain around a toenail. Get help right away if:  You have a wound, scrape, corn, or callus on your foot and: ? You have pain, swelling, or redness that gets worse. ? You have fluid or blood coming from the wound, scrape, corn, or callus. ? Your wound, scrape, corn, or callus feels warm to the touch. ? You have pus or a bad smell coming from the wound, scrape, corn, or callus. ? You have a fever. ? You have a red line going up your leg. Summary  Check your feet every day  for cuts, sores, red spots, swelling, and blisters.  Moisturize feet and legs daily.  Wear shoes that fit properly and have enough cushioning.  If you have foot problems, report any cuts, sores, or bruises to your health care provider immediately.  Schedule a complete foot exam at least once a year (annually) or more often if you have foot problems. This information is not intended to replace advice given to you by your health care provider. Make sure you discuss any questions you have with your health care provider. Document Released: 04/29/2000 Document Revised: 06/14/2017 Document Reviewed: 06/03/2016 [employer] Patient Education  2020 [employer]  

## 2019-03-05 NOTE — Progress Notes (Signed)
Subjective: Joanna Brown presents today referred by Fayrene Helper, MD for diabetic foot evaluation.  Patient relates 35 year history of diabetes.  Patient denies any history of foot wounds.  Patient admits history of numbness, tingling, burning, pins/needles sensations. She states toes feel cold most of the time. She has been diagnosed with peripheral neuropathy.  Today, patient c/o of painful, discolored, thick toenails which interfere with daily activities. Duration greater than one month.  Pain is aggravated when wearing enclosed shoe gear.   Past Medical History:  Diagnosis Date  . Blood transfusion    1980  . Diabetes mellitus   . Dysrhythmia   . Female bladder prolapse   . GERD (gastroesophageal reflux disease)   . Heart disease   . Hyperlipidemia   . Hypertension    echo and stress 4/10 reports on chart, EKG ` LOV 9/12 on chart  . Mild aortic stenosis     Patient Active Problem List   Diagnosis Date Noted  . Aortic valve stenosis 08/23/2013  . Carotid bruit 06/11/2012  . IRRITABLE BOWEL SYNDROME 08/24/2009  . BILIARY DYSKINESIA 05/31/2009  . GERD 11/18/2008  . Change in bowel movement 11/13/2008  . Type 2 diabetes mellitus with stage 1 chronic kidney disease, with long-term current use of insulin ([place]) 08/06/2008  . Mixed hyperlipidemia 08/06/2008  . Overweight (BMI 25.0-29.9) 08/06/2008  . Essential hypertension 08/06/2008    Past Surgical History:  Procedure Laterality Date  . ABDOMINAL HYSTERECTOMY    . ANTERIOR AND POSTERIOR REPAIR  04/26/2011   Procedure: ANTERIOR (CYSTOCELE) AND POSTERIOR REPAIR (RECTOCELE);  Surgeon: Reece Packer, MD;  Location: [facility];  Service: Urology;  Laterality: N/A;  . CATARACT EXTRACTION Bilateral    with IOL  . CHOLECYSTECTOMY  2009  . COLONOSCOPY N/A 12/02/2013   Procedure: COLONOSCOPY;  Surgeon: Danie Binder, MD;  [facility];  Service: Endoscopy;  Laterality: N/A;  9:30 AM - rescheduled to 8:30 -  Doris to notify pt  . COLONOSCOPY W/ POLYPECTOMY    . LEFT HEART CATH  09/10/2008   normal coronary arteries, normal LV systolic function, EF 123456 (Dr. [name])  . LYMPH NODE DISSECTION Right 1997   under arm  . NM MYOCAR PERF WALL MOTION  2010   dipyridamole - mild-mod in intenstiy perfusion defect in mid anterior, mid anteroseptal wall, EF 70%  . OVARY SURGERY     bilateral tumors removed  . THYROIDECTOMY    . THYROIDECTOMY  02/2008  . TRANSTHORACIC ECHOCARDIOGRAM  08/2011   EF=>55%, mild conc LVH; trace MR; mild TR; mild-mod AV calcification with mild valvular AV stenosis  . VAGINAL PROLAPSE REPAIR  04/26/2011   Procedure: VAGINAL VAULT SUSPENSION;  Surgeon: Reece Packer, MD;  Location: [facility];  Service: Urology;  Laterality: N/A;  with Graft  10x6    Current Outpatient Medications on File Prior to Visit  Medication Sig Dispense Refill  . amLODipine (NORVASC) 10 MG tablet TAKE 1 TABLET(10 MG) BY MOUTH EVERY MORNING 90 tablet 1  . aspirin 81 MG tablet Take 81 mg by mouth every morning.     . B-D ULTRAFINE III SHORT PEN 31G X 8 MM MISC USE AS DIRECTED AT BEDTIME WITH INSULIN PENS 100 each 3  . benazepril (LOTENSIN) 40 MG tablet TAKE 1 TABLET(40 MG) BY MOUTH DAILY 90 tablet 1  . Calcium Carbonate-Vit D-Min (CALCIUM 1200) 1200-1000 MG-UNIT CHEW Chew 1 each by mouth daily.    . cholecalciferol (VITAMIN D3) 25  MCG (1000 UT) tablet Take 1,000 Units by mouth daily.    Marland Kitchen ezetimibe (ZETIA) 10 MG tablet Take 1 tablet (10 mg total) by mouth daily. 90 tablet 3  . ferrous sulfate 325 (65 FE) MG tablet Take 325 mg by mouth daily with breakfast.    . glipiZIDE (GLUCOTROL XL) 5 MG 24 hr tablet Take 1 tablet (5 mg total) by mouth daily with breakfast. 30 tablet 3  . glucose blood (TRUE METRIX BLOOD GLUCOSE TEST) test strip Test BG 4 x daily E11.65 150 each 5  . latanoprost (XALATAN) 0.005 % ophthalmic solution PLACE 1 GTT INTO BOTH EYE QHS    . LEVEMIR FLEXTOUCH 100 UNIT/ML Pen INJECT 70 UNITS  INTO THE SKIN AT BEDTIME. 30 mL 2  . metFORMIN (GLUCOPHAGE) 500 MG tablet Take 1 tablet (500 mg total) by mouth 2 (two) times daily with a meal. 60 tablet 2  . Multiple Vitamins-Minerals (CENTRUM) tablet Take 1 tablet by mouth every morning.     . Nutritional Supplements (NUTRITIONAL SUPPLEMENT PO) Take by mouth. Neurx-TF (formulation for peripheral nerve health)    . Omega-3 Fatty Acids (FISH OIL) 1200 MG CAPS Take 1 capsule by mouth every morning.     . pravastatin (PRAVACHOL) 80 MG tablet TAKE 1 TABLET(80 MG) BY MOUTH DAILY 90 tablet 1  . Travoprost, BAK Free, (TRAVATAN) 0.004 % SOLN ophthalmic solution Place 1 drop into both eyes at bedtime.     . triamterene-hydrochlorothiazide (MAXZIDE) 75-50 MG tablet TAKE 1 TABLET BY MOUTH DAILY 90 tablet 1  . UNABLE TO FIND Diabetic shoes x 1  Inserts x 3  Dx E11.9 1 each 0   No current facility-administered medications on file prior to visit.      Allergies  Allergen Reactions  . Senokot Wheat Bran [Wheat Bran]     ABDOMINAL CRAMPS  . Spironolactone     Stomach problems, vision changes     Social History   Occupational History  . Occupation: Retired  Tobacco Use  . Smoking status: Never Smoker  . Smokeless tobacco: Never Used  Substance and Sexual Activity  . Alcohol use: No    Comment: socially- none x 30 years  . Drug use: No  . Sexual activity: Yes    Family History  Problem Relation Age of Onset  . Stomach cancer Mother 34  . Heart disease Mother 47       heart disease  . Heart disease Father 67       MI  . Stroke Maternal Grandfather   . Heart attack Paternal Grandfather   . Hypertension Brother   . Bone cancer Brother   . Hypertension Sister   . Liver cancer Sister   . Hypertension Sister   . Hypertension Child     Immunization History  Administered Date(s) Administered  . Pneumococcal Conjugate-13 12/11/2013  . Pneumococcal Polysaccharide-23 01/13/2010  . Tdap 10/05/2010    Review of systems: Positive  Findings in bold print.  Constitutional:  chills, fatigue, fever, sweats, weight change Communication: Optometrist, sign Ecologist, hand writing, iPad/Android device Head: headaches, head injury Eyes: changes in vision, eye pain, glaucoma, cataracts, macular degeneration, diplopia, glare,  light sensitivity, eyeglasses or contacts, blindness Ears nose mouth throat: hearing impaired, hearing aids,  ringing in ears, deaf, sign language,  vertigo, nosebleeds,  rhinitis,  cold sores, snoring, swollen glands Cardiovascular: HTN, edema, arrhythmia, pacemaker in place, defibrillator in place, chest pain/tightness, chronic anticoagulation, blood clot, heart failure, MI Peripheral Vascular: leg cramps, varicose  veins, blood clots, lymphedema, varicosities Respiratory:  difficulty breathing, denies congestion, SOB, wheezing, cough, emphysema Gastrointestinal: change in appetite or weight, abdominal pain, constipation, diarrhea, nausea, vomiting, vomiting blood, change in bowel habits, abdominal pain, jaundice, rectal bleeding, hemorrhoids, GERD Genitourinary:  nocturia,  pain on urination, polyuria,  blood in urine, Foley catheter, urinary urgency, ESRD on hemodialysis Musculoskeletal: amputation, cramping, stiff joints, painful joints, decreased joint motion, fractures, OA, gout, hemiplegia, paraplegia, uses cane, wheelchair bound, uses walker, uses rollator Skin: +changes in toenails, color change, dryness, itching, mole changes,  rash, wound(s) Neurological: headaches, numbness in feet, paresthesias in feet, burning in feet, fainting,  seizures, change in speech,  headaches, memory problems/poor historian, cerebral palsy, weakness, paralysis, CVA, TIA Endocrine: diabetes, hypothyroidism, hyperthyroidism,  goiter, dry mouth, flushing, heat intolerance,  cold intolerance,  excessive thirst, denies polyuria,  nocturia Hematological:  easy bleeding, excessive bleeding, easy bruising, enlarged lymph  nodes, on long term blood thinner, history of past transusions Allergy/immunological:  hives, eczema, frequent infections, multiple drug allergies, seasonal allergies, transplant recipient, multiple food allergies Psychiatric:  anxiety, depression, mood disorder, suicidal ideations, hallucinations, insomnia  Objective: Vitals:   03/01/19 0850  BP: (!) 142/81  Pulse: 74   Vascular Examination: Capillary refill time immediate x 10 digits  Dorsalis pedis pulses 2/4 b/l.  Posterior tibial pulses 2/4 b/l.  Digital hair decreased x 10 digits.  Skin temperature gradient WNL b/l.  Dermatological Examination: Skin with normal turgor, texture and tone b/l.  Toenails 1-5 b/l discolored, thick, dystrophic with subungual debris and pain with palpation to nailbeds due to thickness of nails.  Musculoskeletal: Muscle strength 5/5 to all LE muscle groups b/l.  HAV with bunion b/l.  Hammertoes 2-5 b/l.  Neurological: Sensation intact 5/5 b/l with 10 gram monofilament.  Vibratory sensation intact b/l.  Assessment: 1. Painful onychomycosis toenails 1-5 b/l  2. HAV with bunion b/l 3. Hammertoes 2-5 b/l 4. NIDDM with polyneuropathy  Plan: 1. Discussed diabetic foot care principles. Literature dispensed on today.  2. For her neuropathy symptoms, a prescription was sent to [facility] for peripheral neuropathy cream which consists of: Bupivacaine 1%, doxepin 3%, gabapentin 6%, pentoxifylline 3%, and Topimarate 1%. Apply 1-2 grams to feet 3-4 times daily as needed for foot pain. 3. Toenails 1-5 b/l were debrided in length and girth without iatrogenic bleeding. 4. Patient to continue soft, supportive shoe gear 5. Patient to report any pedal injuries to medical professional immediately. 6. Follow up 3 months.  7. Patient/POA to call should there be a concern in the interim.

## 2019-03-07 ENCOUNTER — Other Ambulatory Visit: Payer: Self-pay

## 2019-03-07 MED ORDER — TRIAMTERENE-HCTZ 75-50 MG PO TABS: 1.0000 | ORAL_TABLET | Freq: Every day | ORAL | 1 refills | Status: DC

## 2019-03-07 MED ORDER — BENAZEPRIL HCL 40 MG PO TABS: 40.0000 mg | ORAL_TABLET | Freq: Every day | ORAL | 1 refills | Status: DC

## 2019-03-08 ENCOUNTER — Other Ambulatory Visit: Payer: Self-pay | Admitting: "Endocrinology

## 2019-03-16 ENCOUNTER — Other Ambulatory Visit: Payer: Self-pay | Admitting: "Endocrinology

## 2019-04-28 ENCOUNTER — Other Ambulatory Visit: Payer: Self-pay | Admitting: "Endocrinology

## 2019-04-29 ENCOUNTER — Other Ambulatory Visit: Payer: Self-pay | Admitting: "Endocrinology

## 2019-04-30 ENCOUNTER — Encounter (INDEPENDENT_AMBULATORY_CARE_PROVIDER_SITE_OTHER): Payer: Self-pay

## 2019-04-30 ENCOUNTER — Encounter: Payer: Self-pay | Admitting: Internal Medicine

## 2019-04-30 ENCOUNTER — Other Ambulatory Visit: Payer: Self-pay

## 2019-04-30 ENCOUNTER — Ambulatory Visit (INDEPENDENT_AMBULATORY_CARE_PROVIDER_SITE_OTHER): Payer: Medicare Other | Admitting: Internal Medicine

## 2019-04-30 VITALS — BP 148/87 | HR 91 | Temp 96.3°F | Ht 65.0 in | Wt 184.6 lb

## 2019-04-30 DIAGNOSIS — R0602 Shortness of breath: Secondary | ICD-10-CM

## 2019-04-30 DIAGNOSIS — E782 Mixed hyperlipidemia: Secondary | ICD-10-CM | POA: Diagnosis not present

## 2019-04-30 DIAGNOSIS — I350 Nonrheumatic aortic (valve) stenosis: Secondary | ICD-10-CM

## 2019-04-30 DIAGNOSIS — I10 Essential (primary) hypertension: Secondary | ICD-10-CM

## 2019-04-30 NOTE — Progress Notes (Signed)
OFFICE NOTE  Chief Complaint:  Shortness of breath  Primary Care Physician: Fayrene Helper, MD  HPI:  Joanna Brown is a 78 year old female who has a history of mild aortic stenosis with a valve area of approximately 1.7 cm, also a history of dyslipidemia and hypertension, both of which have been well controlled. We are seeing her back for an annual visit today. She reports actually feeling very well, started walking every day, has made major changes to her diet as reflected by a marked decrease in triglycerides. Unfortunately recently she had a mild elevation in liver enzymes slightly above normal on lovastatin. Typically we could tolerate liver enzymes up to 3 times normal on statin medications, however, she was taken off the lovastatin. Either way it is questionable whether she really needs the additional statin at this time and this certainly argues that she should have further workup as to why she had elevated liver enzymes, whether this is due to a new non-alcoholic steatohepatitis or perhaps concomitant medications causing her elevated liver enzymes.  In fact, I reviewed her CT scan today he years ago and she does have steatohepatitis.  Therefore she is not a good candidate for a statin medication. Her blood pressure seems to be well-controlled on her current regimen.  I saw Joanna Brown back in the office today. She is without any new complaints. She occasionally gets some heartburn and is not currently taking medication for that. She denies any chest pain or worsening shortness of breath.  Joanna Brown returns today for follow-up. Again she is without complaints. We discussed her aortic stenosis however she seemed to have little recollection that she has this disorder. I again went over aortic stenosis and the fact that she has mild to moderate narrowing of the aortic valve. This time we are monitoring it clinically. Her last echo was in April of last year. She is asymptomatic. Her  blood pressure is well controlled. She had recent laboratory work which shows an LDL cholesterol of 70 in October which is good control. Her hemoglobin A1c is 7 which is down from 8.3 prior to that. I've encouraged her to continue with this trend is good cholesterol and blood sugar control her helpful in slowing the process of aortic stenosis.  09/12/2016  Joanna Brown was seen today in follow-up. Overall she seems to be doing well. She has no complaints such as shortness of breath or chest pain. EKG is stable showing normal sinus rhythm at 70 with LAFB. Blood pressure is at goal today. She reports her 11 A1c is in the low 7 range. Cholesterol is also been fairly well controlled. She does have a history of moderate aortic stenosis and is due for repeat echo.  09/12/2017  Joanna Brown was seen today in follow-up.  Over the past year she denies any chest pain or worsening shortness of breath.  Unfortunately her hemoglobin A1c is up from the low sevens to 8.2.  Cholesterol also is higher than goal.  Her total cholesterol is 171, HDL 40, LDL 97 and triglycerides 227.  She reports compliance with pravastatin.  She has moderate aortic stenosis with a mean gradient of 20 mmHg based on echo last year and normal LV function.  She did report one brief episode of chest discomfort with more marked exertion a few days ago.  This was right sided underneath the right breast and was a crampy sensation which improved after resting.  I advised that she monitor that further and  if she has more recurrence we may need to consider stress testing.  12/31/2018  Joanna Brown is seen today for routine follow-up.  She was seen in April 2019.  Since then she reports she was doing fairly well up to about 2 weeks ago.  She has had some worsening shortness of breath, particularly at night and while laying down.  She has a mild nonproductive cough.  She also reports some occasional lower extremity edema.  She gets some shortness of breath  with exertion but denies any chest pain, pressure, heaviness or other anginal symptoms.  EKG today shows incomplete right bundle branch pattern.  Her diabetes not well controlled with A1c recently of 8.8.  Her recent cholesterol profile in February showed total cholesterol 145, triglycerides 225, HDL 41 and LDL 73.  She has known aortic stenosis which is at least moderate however not assessed since 2018 by echo.  04/30/2019  Joanna Brown returns today for follow-up.  She had an echo in August 2020 which showed an EF 50 to 55%, moderate LVH, grade 1 diastolic dysfunction.  There was moderate to severe aortic stenosis with a mean gradient of 21 mmHg, AVA by VTI was 1 cm with a dimensionless index of 0.21.  Symptom wise she does report some shortness of breath with exertion but is not consistent.  She has required some pillows to elevate her head at night.  She denies any chest pain at rest.  She has had no presyncopal or syncopal episodes.  PMHx:  Past Medical History:  Diagnosis Date  . Blood transfusion    1980  . Diabetes mellitus   . Dysrhythmia   . Female bladder prolapse   . GERD (gastroesophageal reflux disease)   . Heart disease   . Hyperlipidemia   . Hypertension    echo and stress 4/10 reports on chart, EKG ` LOV 9/12 on chart  . Mild aortic stenosis     Past Surgical History:  Procedure Laterality Date  . ABDOMINAL HYSTERECTOMY    . ANTERIOR AND POSTERIOR REPAIR  04/26/2011   Procedure: ANTERIOR (CYSTOCELE) AND POSTERIOR REPAIR (RECTOCELE);  Surgeon: Reece Packer, MD;  Location: [facility];  Service: Urology;  Laterality: N/A;  . CATARACT EXTRACTION Bilateral    with IOL  . CHOLECYSTECTOMY  2009  . COLONOSCOPY N/A 12/02/2013   Procedure: COLONOSCOPY;  Surgeon: Danie Binder, MD;  [facility];  Service: Endoscopy;  Laterality: N/A;  9:30 AM - rescheduled to 8:30 - Doris to notify pt  . COLONOSCOPY W/ POLYPECTOMY    . LEFT HEART CATH  09/10/2008   normal coronary  arteries, normal LV systolic function, EF 123456 (Dr. [name])  . LYMPH NODE DISSECTION Right 1997   under arm  . NM MYOCAR PERF WALL MOTION  2010   dipyridamole - mild-mod in intenstiy perfusion defect in mid anterior, mid anteroseptal wall, EF 70%  . OVARY SURGERY     bilateral tumors removed  . THYROIDECTOMY    . THYROIDECTOMY  02/2008  . TRANSTHORACIC ECHOCARDIOGRAM  08/2011   EF=>55%, mild conc LVH; trace MR; mild TR; mild-mod AV calcification with mild valvular AV stenosis  . VAGINAL PROLAPSE REPAIR  04/26/2011   Procedure: VAGINAL VAULT SUSPENSION;  Surgeon: Reece Packer, MD;  Location: [facility];  Service: Urology;  Laterality: N/A;  with Graft  10x6    FAMHx:  Family History  Problem Relation Age of Onset  . Stomach cancer Mother 81  . Heart disease Mother  80       heart disease  . Heart disease Father 73       MI  . Stroke Maternal Grandfather   . Heart attack Paternal Grandfather   . Hypertension Brother   . Bone cancer Brother   . Hypertension Sister   . Liver cancer Sister   . Hypertension Sister   . Hypertension Child     SOCHx:   reports that she has never smoked. She has never used smokeless tobacco. She reports that she does not drink alcohol or use drugs.  ALLERGIES:  Allergies  Allergen Reactions  . Senokot Wheat Bran [Wheat Bran]     ABDOMINAL CRAMPS  . Spironolactone     Stomach problems, vision changes     ROS: Pertinent items noted in HPI and remainder of comprehensive ROS otherwise negative.  HOME MEDS: Current Outpatient Medications  Medication Sig Dispense Refill  . amLODipine (NORVASC) 10 MG tablet TAKE 1 TABLET(10 MG) BY MOUTH EVERY MORNING 90 tablet 1  . aspirin 81 MG tablet Take 81 mg by mouth every morning.     . B-D ULTRAFINE III SHORT PEN 31G X 8 MM MISC USE AS DIRECTED AT BEDTIME WITH INSULIN PENS 100 each 3  . benazepril (LOTENSIN) 40 MG tablet Take 1 tablet (40 mg total) by mouth daily. 90 tablet 1  . Calcium Carbonate-Vit  D-Min (CALCIUM 1200) 1200-1000 MG-UNIT CHEW Chew 1 each by mouth daily.    . cholecalciferol (VITAMIN D3) 25 MCG (1000 UT) tablet Take 1,000 Units by mouth daily.    . [employer] Lancets Micro Thin 33G MISC USE 4 TIMES A DAY 100 each 8  . ezetimibe (ZETIA) 10 MG tablet Take 1 tablet (10 mg total) by mouth daily. 90 tablet 3  . ferrous sulfate 325 (65 FE) MG tablet Take 325 mg by mouth daily with breakfast.    . glipiZIDE (GLUCOTROL XL) 5 MG 24 hr tablet TAKE 1 TABLET(5 MG) BY MOUTH DAILY WITH BREAKFAST 30 tablet 3  . glucose blood (TRUE METRIX BLOOD GLUCOSE TEST) test strip Test BG 4 x daily E11.65 150 each 5  . Insulin Detemir (LEVEMIR FLEXTOUCH) 100 UNIT/ML Pen Inject 80 Units into the skin at bedtime. 30 mL 1  . latanoprost (XALATAN) 0.005 % ophthalmic solution PLACE 1 GTT INTO BOTH EYE QHS    . metFORMIN (GLUCOPHAGE) 500 MG tablet TAKE 1 TABLET BY MOUTH EVERY DAY WITH BREAKFAST 60 tablet 2  . Multiple Vitamins-Minerals (CENTRUM) tablet Take 1 tablet by mouth every morning.     . NONFORMULARY OR COMPOUNDED ITEM Peripheral Neuropathy Cream: Bupivacaine 1%, Doxepin 3%, Gabapentin 6%, Pentoxifylline 3%, Topiramate 1% Order faxed to [place] 1 each 3  . Nutritional Supplements (NUTRITIONAL SUPPLEMENT PO) Take by mouth. Neurx-TF (formulation for peripheral nerve health)    . Omega-3 Fatty Acids (FISH OIL) 1200 MG CAPS Take 1 capsule by mouth every morning.     . pravastatin (PRAVACHOL) 80 MG tablet TAKE 1 TABLET(80 MG) BY MOUTH DAILY 90 tablet 1  . Travoprost, BAK Free, (TRAVATAN) 0.004 % SOLN ophthalmic solution Place 1 drop into both eyes at bedtime.     . triamterene-hydrochlorothiazide (MAXZIDE) 75-50 MG tablet Take 1 tablet by mouth daily. 90 tablet 1  . UNABLE TO FIND Diabetic shoes x 1  Inserts x 3  Dx E11.9 1 each 0   No current facility-administered medications for this visit.    LABS/IMAGING: No results found for this or any previous visit (from the past  48 hour(s)). No  results found.  VITALS: Ht 5\' 5"  (1.651 m)   BMI 30.12 kg/m   EXAM: General appearance: alert and no distress Neck: no carotid bruit and no JVD Lungs: clear to auscultation bilaterally Heart: regular rate and rhythm, S1, S2 normal and systolic murmur: late systolic 3/6, crescendo at 2nd left intercostal space Abdomen: soft, non-tender; bowel sounds normal; no masses,  no organomegaly Extremities: extremities normal, atraumatic, no cyanosis or edema Pulses: 2+ and symmetric Skin: Skin color, texture, turgor normal. No rashes or lesions Neurologic: Grossly normal Psych: Mood, affect normal  EKG: Normal sinus rhythm 82, incomplete right bundle branch block, LAFB, voltage criteria for LVH-personally reviewed  ASSESSMENT: 1. Progressive dyspnea on exertion, orthopnea and edema 2. Hypertension-controlled 3. Dyslipidemia 4. Moderate to severe aortic stenosis 5. NAFLD 6. GERD 7. DM2 on insulin  PLAN: 1.   Joanna. Lerew has moderate to severe aortic stenosis with a late systolic murmur however preserved S2.  Her dyspnea is inconsistent but appears to be somewhat worse.  I suspect she is nearing a point where she would be a candidate for aortic valve replacement.  Likely TAVR.  We will plan a repeat echo in about 6 months.  If her symptoms worsen before then obviously we will work that up but I would anticipate that the valve may be severe at that point and would likely refer her to our structural heart clinic.  Follow-up with me in 6 months.  [name], MD, [facility], [place] Director of the Advanced Lipid Disorders &  Cardiovascular Risk Reduction Clinic Diplomate of the American Board of Clinical Lipidology Attending Cardiologist  Direct Dial: [phone]  Fax: [phone]  Website:  [email or web address] 04/30/2019, 9:04 AM

## 2019-04-30 NOTE — Patient Instructions (Addendum)
Medication Instructions: NO CHANGES *If you need a refill on your cardiac medications before your next appointment, please call your pharmacy*  Lab Work: NONE If you have labs (blood work) drawn today and your tests are completely normal, you will receive your results only by: Marland Kitchen MyChart Message (if you have MyChart) OR . A paper copy in the mail If you have any lab test that is abnormal or we need to change your treatment, we will call you to review the results.  Testing/Procedures: Your physician has requested that you have an echocardiogram. Echocardiography is a painless test that uses sound waves to create images of your heart. It provides your doctor with information about the size and shape of your heart and how well your heart's chambers and valves are working. This procedure takes approximately one hour. There are no restrictions for this procedure. ECHO IN 6 MONTHS    Follow-Up: At [facility], you and your health needs are our priority.  As part of our continuing mission to provide you with exceptional heart care, we have created designated Provider Care Teams.  These Care Teams include your primary Cardiologist (physician) and Advanced Practice Providers (APPs -  Physician Assistants and Nurse Practitioners) who all work together to provide you with the care you need, when you need it.  Your next appointment:   6 month(s)  The format for your next appointment:   Either In Person or Virtual  Provider:   DR. Raliegh Ip. HILTY

## 2019-05-08 ENCOUNTER — Encounter: Payer: Self-pay | Admitting: "Endocrinology

## 2019-05-08 ENCOUNTER — Other Ambulatory Visit: Payer: Self-pay

## 2019-05-08 ENCOUNTER — Ambulatory Visit (INDEPENDENT_AMBULATORY_CARE_PROVIDER_SITE_OTHER): Payer: Medicare Other | Admitting: "Endocrinology

## 2019-05-08 VITALS — BP 127/74 | HR 73 | Ht 65.0 in | Wt 182.6 lb

## 2019-05-08 DIAGNOSIS — I10 Essential (primary) hypertension: Secondary | ICD-10-CM | POA: Diagnosis not present

## 2019-05-08 DIAGNOSIS — N181 Chronic kidney disease, stage 1: Secondary | ICD-10-CM | POA: Diagnosis not present

## 2019-05-08 DIAGNOSIS — E1122 Type 2 diabetes mellitus with diabetic chronic kidney disease: Secondary | ICD-10-CM | POA: Diagnosis not present

## 2019-05-08 DIAGNOSIS — Z794 Long term (current) use of insulin: Secondary | ICD-10-CM

## 2019-05-08 DIAGNOSIS — E782 Mixed hyperlipidemia: Secondary | ICD-10-CM

## 2019-05-08 LAB — POCT GLYCOSYLATED HEMOGLOBIN (HGB A1C): Hemoglobin A1C: 8.3 % — AB (ref 4.0–5.6)

## 2019-05-08 MED ORDER — ACCU-CHEK GUIDE W/DEVICE KIT: 1.0000 | PACK | Freq: Four times a day (QID) | 0 refills | Status: DC

## 2019-05-08 MED ORDER — ACCU-CHEK GUIDE VI STRP: ORAL_STRIP | 2 refills | Status: DC

## 2019-05-08 MED ORDER — METFORMIN HCL 500 MG PO TABS: ORAL_TABLET | ORAL | 2 refills | Status: DC

## 2019-05-08 MED ORDER — LANCETS ULTRA FINE MISC: 1.0000 | Freq: Four times a day (QID) | 5 refills | Status: DC

## 2019-05-08 MED ORDER — LEVEMIR FLEXTOUCH 100 UNIT/ML ~~LOC~~ SOPN: 80.0000 [IU] | PEN_INJECTOR | Freq: Every day | SUBCUTANEOUS | 1 refills | Status: DC

## 2019-05-08 MED ORDER — ACCU-CHEK GUIDE W/DEVICE KIT: 1.0000 | PACK | 0 refills | Status: DC

## 2019-05-08 MED ORDER — GLIPIZIDE ER 5 MG PO TB24: ORAL_TABLET | ORAL | 3 refills | Status: DC

## 2019-05-08 MED ORDER — ACCU-CHEK GUIDE VI STRP: ORAL_STRIP | 5 refills | Status: DC

## 2019-05-08 NOTE — Progress Notes (Signed)
05/08/2019                                                    Endocrinology Telehealth Visit Follow up Note -During COVID -19 Pandemic  This visit type was conducted due to national recommendations for restrictions regarding the COVID-19 Pandemic  in an effort to limit this patient's exposure and mitigate transmission of the corona virus.  Due to her co-morbid illnesses, Joanna Brown is at  moderate to high risk for complications without adequate follow up.  This format is felt to be most appropriate for her at this time.  I connected with this patient on 05/08/2019   by telephone and verified that I am speaking with the correct person using two identifiers. [place], [birth year]. she has verbally consented to this visit. All issues noted in this document were discussed and addressed. The format was not optimal for physical exam.    Subjective:    Patient ID: Joanna Brown, female    DOB: March 17, 1941. Patient is being seen in follow-up for the management of currently uncontrolled type 2 diabetes, and associated hyperlipidemia and hypertension. PMD:   Fayrene Helper, MD  Past Medical History:  Diagnosis Date  . Blood transfusion    1980  . Diabetes mellitus   . Dysrhythmia   . Female bladder prolapse   . GERD (gastroesophageal reflux disease)   . Heart disease   . Hyperlipidemia   . Hypertension    echo and stress 4/10 reports on chart, EKG ` LOV 9/12 on chart  . Mild aortic stenosis    Past Surgical History:  Procedure Laterality Date  . ABDOMINAL HYSTERECTOMY    . ANTERIOR AND POSTERIOR REPAIR  04/26/2011   Procedure: ANTERIOR (CYSTOCELE) AND POSTERIOR REPAIR (RECTOCELE);  Surgeon: Reece Packer, MD;  Location: [facility];  Service: Urology;  Laterality: N/A;  . CATARACT EXTRACTION Bilateral    with IOL  . CHOLECYSTECTOMY  2009  . COLONOSCOPY N/A 12/02/2013   Procedure: COLONOSCOPY;  Surgeon: Danie Binder, MD;  Location: [facility];  Service: Endoscopy;   Laterality: N/A;  9:30 AM - rescheduled to 8:30 - Doris to notify pt  . COLONOSCOPY W/ POLYPECTOMY    . LEFT HEART CATH  09/10/2008   normal coronary arteries, normal LV systolic function, EF 21% (Dr. [name])  . LYMPH NODE DISSECTION Right 1997   under arm  . NM MYOCAR PERF WALL MOTION  2010   dipyridamole - mild-mod in intenstiy perfusion defect in mid anterior, mid anteroseptal wall, EF 70%  . OVARY SURGERY     bilateral tumors removed  . THYROIDECTOMY    . THYROIDECTOMY  02/2008  . TRANSTHORACIC ECHOCARDIOGRAM  08/2011   EF=>55%, mild conc LVH; trace MR; mild TR; mild-mod AV calcification with mild valvular AV stenosis  . VAGINAL PROLAPSE REPAIR  04/26/2011   Procedure: VAGINAL VAULT SUSPENSION;  Surgeon: Reece Packer, MD;  Location: [facility];  Service: Urology;  Laterality: N/A;  with Graft  10x6   Social History   Socioeconomic History  . Marital status: Married    Spouse name: [name]  . Number of children: 1  . Years of education: Trade  . Highest education level: 12th grade  Occupational History  . Occupation: Retired  Tobacco Use  . Smoking status: Never Smoker  .  Smokeless tobacco: Never Used  Substance and Sexual Activity  . Alcohol use: No    Comment: socially- none x 30 years  . Drug use: No  . Sexual activity: Yes  Other Topics Concern  . Not on file  Social History Narrative   Patient lives at home with spouse. 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 LIVED IN [place] FOR 50 YRS AND CAME BACK TO [place] ~2009. Caffeine Use: 1 cup of coffee daily. HAD ONE CHILD: PASSED 5 YRS. HAVE THREE GRAND-KIDS AND TWO GREAT GRANDS.    Social Determinants of Health   Financial Resource Strain:   . Difficulty of Paying Living Expenses: Not on file  Food Insecurity: Unknown  . Worried About Charity fundraiser in the Last Year: Not on file  . Ran Out of Food in the Last Year: Never true  Transportation Needs:   . Lack of Transportation (Medical):  Not on file  . Lack of Transportation (Non-Medical): Not on file  Physical Activity: Unknown  . Days of Exercise per Week: 3 days  . Minutes of Exercise per Session: Not on file  Stress: No Stress Concern Present  . Feeling of Stress : Only a little  Social Connections: Not Isolated  . Frequency of Communication with Friends and Family: Twice a week  . Frequency of Social Gatherings with Friends and Family: Twice a week  . Attends Religious Services: More than 4 times per year  . Active Member of Clubs or Organizations: Yes  . Attends Archivist Meetings: More than 4 times per year  . Marital Status: Married   Outpatient Encounter Medications as of 05/08/2019  Medication Sig  . amLODipine (NORVASC) 10 MG tablet TAKE 1 TABLET(10 MG) BY MOUTH EVERY MORNING  . aspirin 81 MG tablet Take 81 mg by mouth every morning.   . B-D ULTRAFINE III SHORT PEN 31G X 8 MM MISC USE AS DIRECTED AT BEDTIME WITH INSULIN PENS  . benazepril (LOTENSIN) 40 MG tablet Take 1 tablet (40 mg total) by mouth daily.  . Blood Glucose Monitoring Suppl (ACCU-CHEK GUIDE) w/Device KIT 1 Piece by Does not apply route as directed.  . Calcium Carbonate-Vit D-Min (CALCIUM 1200) 1200-1000 MG-UNIT CHEW Chew 1 each by mouth daily.  . cholecalciferol (VITAMIN D3) 25 MCG (1000 UT) tablet Take 1,000 Units by mouth daily.  . [employer] Lancets Micro Thin 33G MISC USE 4 TIMES A DAY  . ezetimibe (ZETIA) 10 MG tablet Take 1 tablet (10 mg total) by mouth daily.  . ferrous sulfate 325 (65 FE) MG tablet Take 325 mg by mouth daily with breakfast.  . glipiZIDE (GLUCOTROL XL) 5 MG 24 hr tablet TAKE 1 TABLET(5 MG) BY MOUTH DAILY WITH BREAKFAST  . glucose blood (ACCU-CHEK GUIDE) test strip Use as instructed  . Insulin Detemir (LEVEMIR FLEXTOUCH) 100 UNIT/ML Pen Inject 80 Units into the skin at bedtime.  Marland Kitchen latanoprost (XALATAN) 0.005 % ophthalmic solution PLACE 1 GTT INTO BOTH EYE QHS  . metFORMIN (GLUCOPHAGE) 500 MG tablet TAKE 1 TABLET BY  MOUTH EVERY DAY WITH BREAKFAST  . Multiple Vitamins-Minerals (CENTRUM) tablet Take 1 tablet by mouth every morning.   . NONFORMULARY OR COMPOUNDED ITEM Peripheral Neuropathy Cream: Bupivacaine 1%, Doxepin 3%, Gabapentin 6%, Pentoxifylline 3%, Topiramate 1% Order faxed to [facility]  . Nutritional Supplements (NUTRITIONAL SUPPLEMENT PO) Take by mouth. Neurx-TF (formulation for peripheral nerve health)  . Omega-3 Fatty Acids (FISH OIL) 1200 MG CAPS Take 1 capsule by mouth  every morning.   . pravastatin (PRAVACHOL) 80 MG tablet TAKE 1 TABLET(80 MG) BY MOUTH DAILY  . Travoprost, BAK Free, (TRAVATAN) 0.004 % SOLN ophthalmic solution Place 1 drop into both eyes at bedtime.   . triamterene-hydrochlorothiazide (MAXZIDE) 75-50 MG tablet Take 1 tablet by mouth daily.  Marland Kitchen UNABLE TO FIND Diabetic shoes x 1  Inserts x 3  Dx E11.9  . [DISCONTINUED] glipiZIDE (GLUCOTROL XL) 5 MG 24 hr tablet TAKE 1 TABLET(5 MG) BY MOUTH DAILY WITH BREAKFAST  . [DISCONTINUED] glucose blood (TRUE METRIX BLOOD GLUCOSE TEST) test strip Test BG 4 x daily E11.65  . [DISCONTINUED] Insulin Detemir (LEVEMIR FLEXTOUCH) 100 UNIT/ML Pen Inject 80 Units into the skin at bedtime.  . [DISCONTINUED] metFORMIN (GLUCOPHAGE) 500 MG tablet TAKE 1 TABLET BY MOUTH EVERY DAY WITH BREAKFAST   No facility-administered encounter medications on file as of 05/08/2019.   ALLERGIES: Allergies  Allergen Reactions  . Senokot Wheat Bran [Wheat Bran]     ABDOMINAL CRAMPS  . Spironolactone     Stomach problems, vision changes    VACCINATION STATUS: Immunization History  Administered Date(s) Administered  . Pneumococcal Conjugate-13 12/11/2013  . Pneumococcal Polysaccharide-23 01/13/2010  . Tdap 10/05/2010    Diabetes She presents for her follow-up diabetic visit. She has type 2 diabetes mellitus. Onset time: She was diagnosed at approximate age of 38 years. Her disease course has been improving. There are no hypoglycemic associated  symptoms. Pertinent negatives for hypoglycemia include no confusion, headaches, pallor or seizures. Associated symptoms include polydipsia and polyuria. Pertinent negatives for diabetes include no blurred vision, no chest pain, no fatigue and no polyphagia. There are no hypoglycemic complications. Symptoms are improving. Diabetic complications include nephropathy and retinopathy. Risk factors for coronary artery disease include dyslipidemia, diabetes mellitus, obesity and sedentary lifestyle. Her weight is fluctuating minimally. She is following a generally unhealthy diet. When asked about meal planning, she reported none. She has had a previous visit with a dietitian. She rarely participates in exercise. (She did not bring her logs.  Her meter and dysfunction, does not display her readings.  Her point-of-care A1c is 8.3% slightly improving from 8.9%.) Eye exam is current.  Hyperlipidemia This is a chronic problem. The current episode started more than 1 year ago. The problem is uncontrolled. Exacerbating diseases include diabetes and obesity. Pertinent negatives include no chest pain, myalgias or shortness of breath. Current antihyperlipidemic treatment includes statins. Risk factors for coronary artery disease include dyslipidemia, diabetes mellitus, hypertension, obesity, a sedentary lifestyle and post-menopausal.  Hypertension This is a chronic problem. The current episode started more than 1 year ago. Pertinent negatives include no blurred vision, chest pain, headaches, palpitations or shortness of breath. Risk factors for coronary artery disease include dyslipidemia, diabetes mellitus, obesity and sedentary lifestyle. Hypertensive end-organ damage includes retinopathy.    Review of Systems  Constitutional: Negative for chills, fatigue, fever and unexpected weight change.  HENT: Negative for trouble swallowing and voice change.   Eyes: Negative for blurred vision and visual disturbance.  Respiratory:  Negative for cough, shortness of breath and wheezing.   Cardiovascular: Negative for chest pain, palpitations and leg swelling.  Gastrointestinal: Negative for diarrhea, nausea and vomiting.  Endocrine: Positive for polydipsia and polyuria. Negative for cold intolerance, heat intolerance and polyphagia.  Musculoskeletal: Negative for arthralgias and myalgias.  Skin: Negative for color change, pallor, rash and wound.  Neurological: Negative for seizures and headaches.  Psychiatric/Behavioral: Negative for confusion and suicidal ideas.    Objective:  BP 127/74   Pulse 73   Ht _0  (1.651 m)   Wt 182 lb 9.6 oz (82.8 kg)   BMI 30.39 kg/m   Wt Readings from Last 3 Encounters:  05/08/19 182 lb 9.6 oz (82.8 kg)  04/30/19 184 lb 9.6 oz (83.7 kg)  01/29/19 181 lb (82.1 kg)     CMP ( most recent) CMP     Component Value Date/Time   NA 135 01/01/2019 0741   NA 135 12/31/2018 0915   K 3.9 01/01/2019 0741   CL 96 (L) 01/01/2019 0741   CO2 30 01/01/2019 0741   GLUCOSE 130 (H) 01/01/2019 0741   BUN 13 01/01/2019 0741   BUN 13 12/31/2018 0915   CREATININE 0.96 (H) 01/01/2019 0741   CALCIUM 9.8 01/01/2019 0741   PROT 6.9 01/01/2019 0741   PROT 7.4 12/31/2018 0915   ALBUMIN 4.6 12/31/2018 0915   AST 22 01/01/2019 0741   ALT 24 01/01/2019 0741   ALKPHOS 57 12/31/2018 0915   BILITOT 0.4 01/01/2019 0741   BILITOT 0.2 12/31/2018 0915   GFRNONAA 57 (L) 01/01/2019 0741   GFRAA 66 01/01/2019 0741    Diabetic Labs (most recent): Lab Results  Component Value Date   HGBA1C 8.3 (A) 05/08/2019   HGBA1C 8.9 (H) 01/01/2019   HGBA1C 8.8 (H) 09/28/2018    Lipid Panel     Component Value Date/Time   CHOL 175 01/01/2019 0738   TRIG 184 (H) 01/01/2019 0738   HDL 37 (L) 01/01/2019 0738   CHOLHDL 4.7 01/01/2019 0738   VLDL 39 12/05/2017 0832   LDLCALC 107 (H) 01/01/2019 0738     Assessment & Plan:   1. Type 2 diabetes mellitus with stage 1 chronic kidney disease, with long-term  current use of insulin ([place])  - Patient has currently uncontrolled symptomatic type 2 DM since  78 years of age. - She did not bring her logs, her meter is dysfunctional.  She will be given a new prescription for a new meter with strips.  Her A1c is 8.2% slightly improving from 8.9%.  She denies hypoglycemia.     Recent labs reviewed, showing improving renal function.     Her diabetes is complicated by CKD obesity/sedentary life and patient remains at a high risk for more acute and chronic complications of diabetes which include CAD, CVA, CKD, retinopathy, and neuropathy. These are all discussed in detail with the patient.  - I have counseled the patient on diet management and weight loss, by adopting a carbohydrate restricted/protein rich diet.  -She still admits to dietary indiscretions including consumption of sweets and sweetened beverages.  - she  admits there is a room for improvement in her diet and drink choices. -  Suggestion is made for her to avoid simple carbohydrates  from her diet including Cakes, Sweet Desserts / Pastries, Ice Cream, Soda (diet and regular), Sweet Tea, Candies, Chips, Cookies, Sweet Pastries,  Store Bought Juices, Alcohol in Excess of  1-2 drinks a day, Artificial Sweeteners, Coffee Creamer, and "Sugar-free" Products. This will help patient to have stable blood glucose profile and potentially avoid unintended weight gain.  - I encouraged the patient to switch to  unprocessed or minimally processed complex starch and increased protein intake (animal or plant source), fruits, and vegetables.  - Patient is advised to stick to a routine mealtimes to eat 3 meals  a day and avoid unnecessary snacks ( to snack only to correct hypoglycemia).   - I have approached patient  with the following individualized plan to manage diabetes and patient agrees:   -A prescription for a new meter and strips sent to her pharmacy.  She is advised to continue Levemir 80 units nightly,  start monitoring blood glucose strictly twice a day-daily before breakfast and at bedtime and she will be on the phone visit in 4 weeks.    -Patient is encouraged to call clinic for blood glucose levels less than 70 or above 200 mg /dl. -She will not need prandial insulin for now.  She is advised to continue glipizide 5 mg XL p.o. daily at breakfast. -She is advised to continue metformin  500 mg by mouth twice a day, therapeutically suitable for patient. -Patient is not a candidate for  SGLT2 inhibitors due to CKD. -If her next visit A1c remains above  9% on next visit, she will be reconsidered for  prandial insulin in addition to her basal insulin.  - Patient specific target  A1c;  LDL, HDL, Triglycerides, and  Waist Circumference were discussed in detail.  2) BP/HTN: Her blood pressure is controlled to target.   She is advised to continue her current blood pressure medications including benazepril 40 mg p.o. daily.   3) Lipids/HPL: Her recent lipid panel showed uncontrolled LDL at 97.  She is advised to continue pravastatin 80 mg p.o. nightly.      4)  Weight/Diet: CDE Consult has been initiated , exercise, and detailed carbohydrates information provided.  5) Chronic Care/Health Maintenance:  -Patient is on ACEI/ARB and Statin medications and encouraged to continue to follow up with Ophthalmology, Podiatrist at least yearly or according to recommendations, and advised to  stay away from smoking. I have recommended yearly flu vaccine and pneumonia vaccination at least every 5 years; moderate intensity exercise for up to 150 minutes weekly; and  sleep for at least 7 hours a day.   - I advised patient to maintain close follow up with Fayrene Helper, MD for primary care needs.  - Patient Care Time Today:  25 min, of which >50% was spent in  counseling and the rest reviewing her  current and  previous labs/studies, previous treatments, her blood glucose readings, and medications' doses and  developing a plan for long-term care based on the latest recommendations for standards of care.   Derinda Late England participated in the discussions, expressed understanding, and voiced agreement with the above plans.  All questions were answered to her satisfaction. she is encouraged to contact clinic should she have any questions or concerns prior to her return visit.   Follow up plan: - Return in about 4 weeks (around 06/05/2019), or phone, for Follow up with Meter and Logs Only - no Labs.  Glade Lloyd, MD Phone: [phone]  Fax: [phone]   This note was partially dictated with voice recognition software. Similar sounding words can be transcribed inadequately or may not  be corrected upon review.  05/08/2019, 3:19 PM

## 2019-05-08 NOTE — Patient Instructions (Signed)

## 2019-05-15 ENCOUNTER — Encounter: Payer: Self-pay | Admitting: Podiatry

## 2019-05-15 ENCOUNTER — Other Ambulatory Visit: Payer: Self-pay

## 2019-05-15 ENCOUNTER — Ambulatory Visit (INDEPENDENT_AMBULATORY_CARE_PROVIDER_SITE_OTHER): Payer: Medicare Other | Admitting: Podiatry

## 2019-05-15 DIAGNOSIS — E1142 Type 2 diabetes mellitus with diabetic polyneuropathy: Secondary | ICD-10-CM | POA: Diagnosis not present

## 2019-05-15 DIAGNOSIS — B351 Tinea unguium: Secondary | ICD-10-CM | POA: Diagnosis not present

## 2019-05-15 DIAGNOSIS — M79675 Pain in left toe(s): Secondary | ICD-10-CM

## 2019-05-15 DIAGNOSIS — M79674 Pain in right toe(s): Secondary | ICD-10-CM | POA: Diagnosis not present

## 2019-05-15 NOTE — Progress Notes (Signed)
Complaint:  Visit Type: Patient returns to my office for continued preventative foot care services. Complaint: Patient states" my nails have grown long and thick and become painful to walk and wear shoes" Patient has been diagnosed with DM with neuropathy.. The patient presents for preventative foot care services.  Podiatric Exam: Vascular: dorsalis pedis and posterior tibial pulses are palpable bilateral. Capillary return is immediate. Temperature gradient is WNL. Skin turgor WNL  Sensorium: Normal Semmes Weinstein monofilament test. Normal tactile sensation bilaterally. Nail Exam: Pt has thick disfigured discolored nails with subungual debris noted bilateral entire nail hallux through fifth toenails Ulcer Exam: There is no evidence of ulcer or pre-ulcerative changes or infection. Orthopedic Exam: Muscle tone and strength are WNL. No limitations in general ROM. No crepitus or effusions noted. Foot type and digits show no abnormalities. Mild  HAV  B/L. Skin: No Porokeratosis. No infection or ulcers  Diagnosis:  Onychomycosis, , Pain in right toe, pain in left toes  Treatment & Plan Procedures and Treatment: Consent by patient was obtained for treatment procedures.   Debridement of mycotic and hypertrophic toenails, 1 through 5 bilateral and clearing of subungual debris. No ulceration, no infection noted.  Return Visit-Office Procedure: Patient instructed to return to the office for a follow up visit 3 months for continued evaluation and treatment.    Gardiner Barefoot DPM

## 2019-05-16 ENCOUNTER — Other Ambulatory Visit: Payer: Self-pay | Admitting: Family Medicine

## 2019-06-05 ENCOUNTER — Encounter: Payer: Self-pay | Admitting: "Endocrinology

## 2019-06-05 ENCOUNTER — Ambulatory Visit (INDEPENDENT_AMBULATORY_CARE_PROVIDER_SITE_OTHER): Payer: Medicare Other | Admitting: "Endocrinology

## 2019-06-05 DIAGNOSIS — Z794 Long term (current) use of insulin: Secondary | ICD-10-CM

## 2019-06-05 DIAGNOSIS — I10 Essential (primary) hypertension: Secondary | ICD-10-CM

## 2019-06-05 DIAGNOSIS — E782 Mixed hyperlipidemia: Secondary | ICD-10-CM

## 2019-06-05 DIAGNOSIS — E1122 Type 2 diabetes mellitus with diabetic chronic kidney disease: Secondary | ICD-10-CM | POA: Diagnosis not present

## 2019-06-05 DIAGNOSIS — N181 Chronic kidney disease, stage 1: Secondary | ICD-10-CM | POA: Diagnosis not present

## 2019-06-05 NOTE — Progress Notes (Signed)
06/05/2019                                                    Endocrinology Telehealth Visit Follow up Note -During COVID -19 Pandemic  This visit type was conducted due to national recommendations for restrictions regarding the COVID-19 Pandemic  in an effort to limit this patient's exposure and mitigate transmission of the corona virus.  Due to her co-morbid illnesses, Joanna Brown is at  moderate to high risk for complications without adequate follow up.  This format is felt to be most appropriate for her at this time.  I connected with this patient on 06/05/2019   by telephone and verified that I am speaking with the correct person using two identifiers. [place], 1940-09-27. she has verbally consented to this visit. All issues noted in this document were discussed and addressed. The format was not optimal for physical exam.     Subjective:    Patient ID: Joanna Brown, female    DOB: 1940/10/06. Patient is being engaged in telehealth via telephone in follow-up for the management of currently uncontrolled type 2 diabetes, and associated hyperlipidemia and hypertension. PMD:   Fayrene Helper, MD  Past Medical History:  Diagnosis Date  . Blood transfusion    1980  . Diabetes mellitus   . Dysrhythmia   . Female bladder prolapse   . GERD (gastroesophageal reflux disease)   . Heart disease   . Hyperlipidemia   . Hypertension    echo and stress 4/10 reports on chart, EKG ` LOV 9/12 on chart  . Mild aortic stenosis    Past Surgical History:  Procedure Laterality Date  . ABDOMINAL HYSTERECTOMY    . ANTERIOR AND POSTERIOR REPAIR  04/26/2011   Procedure: ANTERIOR (CYSTOCELE) AND POSTERIOR REPAIR (RECTOCELE);  Surgeon: Reece Packer, MD;  Location: [facility];  Service: Urology;  Laterality: N/A;  . CATARACT EXTRACTION Bilateral    with IOL  . CHOLECYSTECTOMY  2009  . COLONOSCOPY N/A 12/02/2013   Procedure: COLONOSCOPY;  Surgeon: Danie Binder, MD;  Location: [facility];  Service: Endoscopy;  Laterality: N/A;  9:30 AM - rescheduled to 8:30 - Doris to notify pt  . COLONOSCOPY W/ POLYPECTOMY    . LEFT HEART CATH  09/10/2008   normal coronary arteries, normal LV systolic function, EF 40% (Dr. [name])  . LYMPH NODE DISSECTION Right 1997   under arm  . NM MYOCAR PERF WALL MOTION  2010   dipyridamole - mild-mod in intenstiy perfusion defect in mid anterior, mid anteroseptal wall, EF 70%  . OVARY SURGERY     bilateral tumors removed  . THYROIDECTOMY    . THYROIDECTOMY  02/2008  . TRANSTHORACIC ECHOCARDIOGRAM  08/2011   EF=>55%, mild conc LVH; trace MR; mild TR; mild-mod AV calcification with mild valvular AV stenosis  . VAGINAL PROLAPSE REPAIR  04/26/2011   Procedure: VAGINAL VAULT SUSPENSION;  Surgeon: Reece Packer, MD;  Location: [facility];  Service: Urology;  Laterality: N/A;  with Graft  10x6   Social History   Socioeconomic History  . Marital status: Married    Spouse name: [name]  . Number of children: 1  . Years of education: Trade  . Highest education level: 12th grade  Occupational History  . Occupation: Retired  Tobacco Use  .  Smoking status: Never Smoker  . Smokeless tobacco: Never Used  Substance and Sexual Activity  . Alcohol use: No    Comment: socially- none x 30 years  . Drug use: No  . Sexual activity: Yes  Other Topics Concern  . Not on file  Social History Narrative   Patient lives at home with spouse. 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 LIVED IN [place] FOR 50 YRS AND CAME BACK TO [place] ~2009. Caffeine Use: 1 cup of coffee daily. HAD ONE CHILD: PASSED 5 YRS. HAVE THREE GRAND-KIDS AND TWO GREAT GRANDS.    Social Determinants of Health   Financial Resource Strain:   . Difficulty of Paying Living Expenses: Not on file  Food Insecurity: Unknown  . Worried About Charity fundraiser in the Last Year: Not on file  . Ran Out of Food in the Last Year: Never true  Transportation Needs:   . Lack  of Transportation (Medical): Not on file  . Lack of Transportation (Non-Medical): Not on file  Physical Activity: Unknown  . Days of Exercise per Week: 3 days  . Minutes of Exercise per Session: Not on file  Stress: No Stress Concern Present  . Feeling of Stress : Only a little  Social Connections: Not Isolated  . Frequency of Communication with Friends and Family: Twice a week  . Frequency of Social Gatherings with Friends and Family: Twice a week  . Attends Religious Services: More than 4 times per year  . Active Member of Clubs or Organizations: Yes  . Attends Archivist Meetings: More than 4 times per year  . Marital Status: Married   Outpatient Encounter Medications as of 06/05/2019  Medication Sig  . amLODipine (NORVASC) 10 MG tablet TAKE 1 TABLET(10 MG) BY MOUTH EVERY MORNING  . aspirin 81 MG tablet Take 81 mg by mouth every morning.   . B-D ULTRAFINE III SHORT PEN 31G X 8 MM MISC USE AS DIRECTED AT BEDTIME WITH INSULIN PENS  . benazepril (LOTENSIN) 40 MG tablet Take 1 tablet (40 mg total) by mouth daily.  . Blood Glucose Monitoring Suppl (ACCU-CHEK GUIDE) w/Device KIT 1 each by Does not apply route 4 (four) times daily.  . Calcium Carbonate-Vit D-Min (CALCIUM 1200) 1200-1000 MG-UNIT CHEW Chew 1 each by mouth daily.  . cholecalciferol (VITAMIN D3) 25 MCG (1000 UT) tablet Take 1,000 Units by mouth daily.  Marland Kitchen ezetimibe (ZETIA) 10 MG tablet Take 1 tablet (10 mg total) by mouth daily.  . ferrous sulfate 325 (65 FE) MG tablet Take 325 mg by mouth daily with breakfast.  . glipiZIDE (GLUCOTROL XL) 5 MG 24 hr tablet TAKE 1 TABLET(5 MG) BY MOUTH DAILY WITH BREAKFAST  . glucose blood (ACCU-CHEK GUIDE) test strip Use as instructed q.i.d  E11.65  . Insulin Detemir (LEVEMIR FLEXTOUCH) 100 UNIT/ML Pen Inject 80 Units into the skin at bedtime.  . Lancets Ultra Fine MISC 1 each by Does not apply route 4 (four) times daily.  Marland Kitchen latanoprost (XALATAN) 0.005 % ophthalmic solution PLACE 1 GTT  INTO BOTH EYE QHS  . metFORMIN (GLUCOPHAGE) 500 MG tablet TAKE 1 TABLET BY MOUTH EVERY DAY WITH BREAKFAST  . Multiple Vitamins-Minerals (CENTRUM) tablet Take 1 tablet by mouth every morning.   . NONFORMULARY OR COMPOUNDED ITEM Peripheral Neuropathy Cream: Bupivacaine 1%, Doxepin 3%, Gabapentin 6%, Pentoxifylline 3%, Topiramate 1% Order faxed to [facility]  . Nutritional Supplements (NUTRITIONAL SUPPLEMENT PO) Take by mouth. Neurx-TF (formulation for peripheral nerve health)  .  Omega-3 Fatty Acids (FISH OIL) 1200 MG CAPS Take 1 capsule by mouth every morning.   . pravastatin (PRAVACHOL) 80 MG tablet TAKE 1 TABLET(80 MG) BY MOUTH DAILY  . Travoprost, BAK Free, (TRAVATAN) 0.004 % SOLN ophthalmic solution Place 1 drop into both eyes at bedtime.   . triamterene-hydrochlorothiazide (MAXZIDE) 75-50 MG tablet Take 1 tablet by mouth daily.  Marland Kitchen UNABLE TO FIND Diabetic shoes x 1  Inserts x 3  Dx E11.9   No facility-administered encounter medications on file as of 06/05/2019.   ALLERGIES: Allergies  Allergen Reactions  . Senokot Wheat Bran [Wheat Bran]     ABDOMINAL CRAMPS  . Spironolactone     Stomach problems, vision changes    VACCINATION STATUS: Immunization History  Administered Date(s) Administered  . Pneumococcal Conjugate-13 12/11/2013  . Pneumococcal Polysaccharide-23 01/13/2010  . Tdap 10/05/2010    Diabetes She presents for her follow-up diabetic visit. She has type 2 diabetes mellitus. Onset time: She was diagnosed at approximate age of 85 years. Her disease course has been improving. There are no hypoglycemic associated symptoms. Pertinent negatives for hypoglycemia include no confusion, headaches, pallor or seizures. Associated symptoms include polydipsia and polyuria. Pertinent negatives for diabetes include no blurred vision, no chest pain, no fatigue and no polyphagia. There are no hypoglycemic complications. Symptoms are improving. Diabetic complications include  nephropathy and retinopathy. Risk factors for coronary artery disease include dyslipidemia, diabetes mellitus, obesity and sedentary lifestyle. Her weight is fluctuating minimally. She is following a generally unhealthy diet. When asked about meal planning, she reported none. She has had a previous visit with a dietitian. She rarely participates in exercise. Her home blood glucose trend is decreasing steadily. Her breakfast blood glucose range is generally 110-130 mg/dl. Her bedtime blood glucose range is generally 180-200 mg/dl. Her overall blood glucose range is 180-200 mg/dl. Eye exam is current.  Hyperlipidemia This is a chronic problem. The current episode started more than 1 year ago. The problem is uncontrolled. Exacerbating diseases include diabetes and obesity. Pertinent negatives include no chest pain, myalgias or shortness of breath. Current antihyperlipidemic treatment includes statins. Risk factors for coronary artery disease include dyslipidemia, diabetes mellitus, hypertension, obesity, a sedentary lifestyle and post-menopausal.  Hypertension This is a chronic problem. The current episode started more than 1 year ago. Pertinent negatives include no blurred vision, chest pain, headaches, palpitations or shortness of breath. Risk factors for coronary artery disease include dyslipidemia, diabetes mellitus, obesity and sedentary lifestyle. Hypertensive end-organ damage includes retinopathy.    Review of systems: Limited as above.  Objective:    There were no vitals taken for this visit.  Wt Readings from Last 3 Encounters:  05/08/19 182 lb 9.6 oz (82.8 kg)  04/30/19 184 lb 9.6 oz (83.7 kg)  01/29/19 181 lb (82.1 kg)     CMP ( most recent) CMP     Component Value Date/Time   NA 135 01/01/2019 0741   NA 135 12/31/2018 0915   K 3.9 01/01/2019 0741   CL 96 (L) 01/01/2019 0741   CO2 30 01/01/2019 0741   GLUCOSE 130 (H) 01/01/2019 0741   BUN 13 01/01/2019 0741   BUN 13 12/31/2018  0915   CREATININE 0.96 (H) 01/01/2019 0741   CALCIUM 9.8 01/01/2019 0741   PROT 6.9 01/01/2019 0741   PROT 7.4 12/31/2018 0915   ALBUMIN 4.6 12/31/2018 0915   AST 22 01/01/2019 0741   ALT 24 01/01/2019 0741   ALKPHOS 57 12/31/2018 0915   BILITOT 0.4 01/01/2019  0741   BILITOT 0.2 12/31/2018 0915   GFRNONAA 57 (L) 01/01/2019 0741   GFRAA 66 01/01/2019 0741    Diabetic Labs (most recent): Lab Results  Component Value Date   HGBA1C 8.3 (A) 05/08/2019   HGBA1C 8.9 (H) 01/01/2019   HGBA1C 8.8 (H) 09/28/2018    Lipid Panel     Component Value Date/Time   CHOL 175 01/01/2019 0738   TRIG 184 (H) 01/01/2019 0738   HDL 37 (L) 01/01/2019 0738   CHOLHDL 4.7 01/01/2019 0738   VLDL 39 12/05/2017 0832   LDLCALC 107 (H) 01/01/2019 0738     Assessment & Plan:   1. Type 2 diabetes mellitus with stage 1 chronic kidney disease, with long-term current use of insulin ([place])  - Patient has currently uncontrolled symptomatic type 2 DM since  79 years of age. - She reports improved glycemic profile, fasting near target.  Slightly above target postprandial blood glucose profile.  Her previsit A1c was 8.3% improving from 8.9%.    She denies hypoglycemia.     Recent labs reviewed, showing improving renal function.     Her diabetes is complicated by CKD obesity/sedentary life and patient remains at a high risk for more acute and chronic complications of diabetes which include CAD, CVA, CKD, retinopathy, and neuropathy. These are all discussed in detail with the patient.  - I have counseled the patient on diet management and weight loss, by adopting a carbohydrate restricted/protein rich diet.  -She still admits to dietary indiscretions including consumption of sweets and sweetened beverages.  - she  admits there is a room for improvement in her diet and drink choices. -  Suggestion is made for her to avoid simple carbohydrates  from her diet including Cakes, Sweet Desserts / Pastries, Ice Cream,  Soda (diet and regular), Sweet Tea, Candies, Chips, Cookies, Sweet Pastries,  Store Bought Juices, Alcohol in Excess of  1-2 drinks a day, Artificial Sweeteners, Coffee Creamer, and "Sugar-free" Products. This will help patient to have stable blood glucose profile and potentially avoid unintended weight gain.  - I encouraged the patient to switch to  unprocessed or minimally processed complex starch and increased protein intake (animal or plant source), fruits, and vegetables.  - Patient is advised to stick to a routine mealtimes to eat 3 meals  a day and avoid unnecessary snacks ( to snack only to correct hypoglycemia).   - I have approached patient with the following individualized plan to manage diabetes and patient agrees:   -She has responded to addition of low-dose glipizide.  Based on her reported glycemic profile, she will continue to need at least basal insulin to maintain control of diabetes to target. -Accordingly, she is advised to continue Levemir 80 units nightly, continue monitoring blood glucose twice daily-daily before breakfast and at bedtime.  -Patient is encouraged to call clinic for blood glucose levels less than 70 or above 200 mg /dl. -She will not need prandial insulin for now.  She is advised to continue glipizide 5 mg XL p.o. daily at breakfast. -She is advised to continue metformin  500 mg by mouth twice a day, therapeutically suitable for patient. -Patient is not a candidate for  SGLT2 inhibitors due to CKD. -If her next visit A1c remains above  9% on next visit, she will be reconsidered for  prandial insulin in addition to her basal insulin.  - Patient specific target  A1c;  LDL, HDL, Triglycerides, were discussed in detail.  2) BP/HTN: she is advised  to home monitor blood pressure and report if > 140/90 on 2 separate readings.    She is advised to continue her current blood pressure medications including benazepril 40 mg p.o. daily.   3) Lipids/HPL: Her recent  lipid panel showed uncontrolled LDL at 97.  She is advised to continue pravastatin 80 mg p.o. nightly.        4)  Weight/Diet: CDE Consult has been initiated , exercise, and detailed carbohydrates information provided.  5) Chronic Care/Health Maintenance:  -Patient is on ACEI/ARB and Statin medications and encouraged to continue to follow up with Ophthalmology, Podiatrist at least yearly or according to recommendations, and advised to  stay away from smoking. I have recommended yearly flu vaccine and pneumonia vaccination at least every 5 years; moderate intensity exercise for up to 150 minutes weekly; and  sleep for at least 7 hours a day.   - I advised patient to maintain close follow up with Fayrene Helper, MD for primary care needs.  - Time spent on this patient care encounter:  35 min, of which >50% was spent in  counseling and the rest reviewing her  current and  previous labs/studies ( including abstraction from other facilities),  previous treatments, her blood glucose readings, and medications' doses and developing a plan for long-term care based on the latest recommendations for standards of care; and documenting her care.  Derinda Late Decamp participated in the discussions, expressed understanding, and voiced agreement with the above plans.  All questions were answered to her satisfaction. she is encouraged to contact clinic should she have any questions or concerns prior to her return visit.    Follow up plan: - Return in about 9 weeks (around 08/07/2019) for Bring Meter and Logs- A1c in Office, Follow up with Pre-visit Labs.  Glade Lloyd, MD Phone: [phone]  Fax: [phone]   This note was partially dictated with voice recognition software. Similar sounding words can be transcribed inadequately or may not  be corrected upon review.  06/05/2019, 12:38 PM

## 2019-06-05 NOTE — Patient Instructions (Signed)
                                     Advice for Weight Management  -For most of us the best way to lose weight is by diet management. Generally speaking, diet management means consuming less calories intentionally which over time brings about progressive weight loss.  This can be achieved more effectively by restricting carbohydrate consumption to the minimum possible.  So, it is critically important to know your numbers: how much calorie you are consuming and how much calorie you need. More importantly, our carbohydrates sources should be unprocessed or minimally processed complex starch food items.   Sometimes, it is important to balance nutrition by increasing protein intake (animal or plant source), fruits, and vegetables.  -Sticking to a routine mealtime to eat 3 meals a day and avoiding unnecessary snacks is shown to have a big role in weight control. Under normal circumstances, the only time we lose real weight is when we are hungry, so allow hunger to take place- hunger means no food between meal times, only water.  It is not advisable to starve.   -It is better to avoid simple carbohydrates including: Cakes, Sweet Desserts, Ice Cream, Soda (diet and regular), Sweet Tea, Candies, Chips, Cookies, Store Bought Juices, Alcohol in Excess of  1-2 drinks a day, Artificial Sweeteners, Doughnuts, Coffee Creamers, "Sugar-free" Products, etc, etc.  This is not a complete list.....    -Consulting with certified diabetes educators is proven to provide you with the most accurate and current information on diet.  Also, you may be  interested in discussing diet options/exchanges , we can schedule a visit with Penny Crumpton, RDN, CDE for individualized nutrition education.  -Exercise: If you are able: 30 -60 minutes a day ,4 days a week, or 150 minutes a week.  The longer the better.  Combine stretch, strength, and aerobic activities.  If you were told in the past that you  have high risk for cardiovascular diseases, you may seek evaluation by your heart doctor prior to initiating moderate to intense exercise programs.                                  Additional Care Considerations for Diabetes   -Diabetes  is a chronic disease.  The most important care consideration is regular follow-up with your diabetes care provider with the goal being avoiding or delaying its complications and to take advantage of advances in medications and technology.    -Type 2 diabetes is known to coexist with other important comorbidities such as high blood pressure and high cholesterol.  It is critical to control not only the diabetes but also the high blood pressure and high cholesterol to minimize and delay the risk of complications including coronary artery disease, stroke, amputations, blindness, etc.    - Studies showed that people with diabetes will benefit from a class of medications known as ACE inhibitors and statins.  Unless there are specific reasons not to be on these medications, the standard of care is to consider getting one from these groups of medications at an optimal doses.  These medications are generally considered safe and proven to help protect the heart and the kidneys.    - People with diabetes are encouraged to initiate and maintain regular follow-up with eye doctors, foot doctors, dentists ,   and if necessary heart and kidney doctors.     - It is highly recommended that people with diabetes quit smoking or stay away from smoking, and get yearly  flu vaccine and pneumonia vaccine at least every 5 years.  One other important lifestyle recommendation is to ensure adequate sleep - at least 6-7 hours of uninterrupted sleep at night.  -Exercise: If you are able: 30 -60 minutes a day, 4 days a week, or 150 minutes a week.  The longer the better.  Combine stretch, strength, and aerobic activities.  If you were told in the past that you have high risk for cardiovascular  diseases, you may seek evaluation by your heart doctor prior to initiating moderate to intense exercise programs.     COVID-19 Vaccine Information can be found at: [email or web address] For questions related to vaccine distribution or appointments, please email [email or web address] or call [phone].        

## 2019-06-26 DIAGNOSIS — H401131 Primary open-angle glaucoma, bilateral, mild stage: Secondary | ICD-10-CM | POA: Diagnosis not present

## 2019-07-05 ENCOUNTER — Other Ambulatory Visit: Payer: Self-pay

## 2019-07-05 ENCOUNTER — Encounter: Payer: Self-pay | Admitting: Family Medicine

## 2019-07-05 ENCOUNTER — Ambulatory Visit (INDEPENDENT_AMBULATORY_CARE_PROVIDER_SITE_OTHER): Payer: Medicare Other | Admitting: Family Medicine

## 2019-07-05 VITALS — BP 139/78 | Wt 182.0 lb

## 2019-07-05 DIAGNOSIS — I350 Nonrheumatic aortic (valve) stenosis: Secondary | ICD-10-CM

## 2019-07-05 DIAGNOSIS — E663 Overweight: Secondary | ICD-10-CM | POA: Diagnosis not present

## 2019-07-05 DIAGNOSIS — E782 Mixed hyperlipidemia: Secondary | ICD-10-CM | POA: Diagnosis not present

## 2019-07-05 DIAGNOSIS — N181 Chronic kidney disease, stage 1: Secondary | ICD-10-CM

## 2019-07-05 DIAGNOSIS — K589 Irritable bowel syndrome without diarrhea: Secondary | ICD-10-CM | POA: Diagnosis not present

## 2019-07-05 DIAGNOSIS — Z794 Long term (current) use of insulin: Secondary | ICD-10-CM

## 2019-07-05 DIAGNOSIS — E1122 Type 2 diabetes mellitus with diabetic chronic kidney disease: Secondary | ICD-10-CM | POA: Diagnosis not present

## 2019-07-05 DIAGNOSIS — I10 Essential (primary) hypertension: Secondary | ICD-10-CM | POA: Diagnosis not present

## 2019-07-05 NOTE — Assessment & Plan Note (Signed)
Follow with Echo of heart soon, ordered by Cards.

## 2019-07-05 NOTE — Progress Notes (Signed)
Virtual Visit via Telephone Note   This visit type was conducted due to national recommendations for restrictions regarding the COVID-19 Pandemic (e.g. social distancing) in an effort to limit this patient's exposure and mitigate transmission in our community.  Due to her co-morbid illnesses, this patient is at least at moderate risk for complications without adequate follow up.  This format is felt to be most appropriate for this patient at this time.  The patient did not have access to video technology/had technical difficulties with video requiring transitioning to audio format only (telephone).  All issues noted in this document were discussed and addressed.  No physical exam could be performed with this format.   Evaluation Performed:  Follow-up visit  Date:  07/05/2019   ID:  Joanna Brown, DOB Nov 22, 1940, MRN [id number]  Patient Location: Home Provider Location: Office  Location of Patient: Home Location of Provider: Telehealth Consent was obtain for visit to be over via telehealth. I verified that I am speaking with the correct person using two identifiers.  PCP:  Fayrene Helper, MD   Chief Complaint: Follow-up chronic conditions  History of Present Illness:    Joanna Brown is a 79 y.o. female with history of hypertension, diabetes, hyperlipidemia, dysrhythmia, aortic stenosis among others.  Today Joanna Brown reports that she is doing well. She reports that she is following up with cardiology soon as she has been ordered an echocardiogram to check her valves.  She reports that she is followed up with Dr. Dorris Fetch her blood sugars have been running a little bit high she thinks that that is because she is not been eating the best diet over the last week or 2.  No medications have been changed since she was last seen.  The only complaint that she has when questioned is that she continues to have constipation issues and wants to know if she can use a fleets enema to help time  to time.  She denies having any chest pain, headaches, dizziness, vision changes, leg swelling, palpitations, cough, shortness of breath, blood in bowel movements or urine.  She reports that she is eating well and sleeping well.  She denies having any skin issues.  Denies having any falls and reports that her mood is good.  The patient does not have symptoms concerning for COVID-19 infection (fever, chills, cough, or new shortness of breath).   Past Medical, Surgical, Social History, Allergies, and Medications have been Reviewed.  Past Medical History:  Diagnosis Date  . Blood transfusion    1980  . Diabetes mellitus   . Dysrhythmia   . Female bladder prolapse   . GERD 11/18/2008   Qualifier: Diagnosis of  By: Craige Cotta    . GERD (gastroesophageal reflux disease)   . Heart disease   . Hyperlipidemia   . Hypertension    echo and stress 4/10 reports on chart, EKG ` LOV 9/12 on chart  . Mild aortic stenosis    Past Surgical History:  Procedure Laterality Date  . ABDOMINAL HYSTERECTOMY    . ANTERIOR AND POSTERIOR REPAIR  04/26/2011   Procedure: ANTERIOR (CYSTOCELE) AND POSTERIOR REPAIR (RECTOCELE);  Surgeon: Reece Packer, MD;  Location: [facility];  Service: Urology;  Laterality: N/A;  . CATARACT EXTRACTION Bilateral    with IOL  . CHOLECYSTECTOMY  2009  . COLONOSCOPY N/A 12/02/2013   Procedure: COLONOSCOPY;  Surgeon: Danie Binder, MD;  [facility];  Service: Endoscopy;  Laterality: N/A;  9:30 AM - rescheduled to 8:30 - Doris to notify pt  . COLONOSCOPY W/ POLYPECTOMY    . LEFT HEART CATH  09/10/2008   normal coronary arteries, normal LV systolic function, EF [id number] (Dr. [name])  . LYMPH NODE DISSECTION Right 1997   under arm  . NM MYOCAR PERF WALL MOTION  2010   dipyridamole - mild-mod in intenstiy perfusion defect in mid anterior, mid anteroseptal wall, EF 70%  . OVARY SURGERY     bilateral tumors removed  . THYROIDECTOMY    . THYROIDECTOMY  02/2008  .  TRANSTHORACIC ECHOCARDIOGRAM  08/2011   EF=>55%, mild conc LVH; trace MR; mild TR; mild-mod AV calcification with mild valvular AV stenosis  . VAGINAL PROLAPSE REPAIR  04/26/2011   Procedure: VAGINAL VAULT SUSPENSION;  Surgeon: Reece Packer, MD;  Location: [facility];  Service: Urology;  Laterality: N/A;  with Graft  10x6     No outpatient medications have been marked as taking for the 07/05/19 encounter (Office Visit) with Perlie Mayo, NP.     Allergies:   Senokot wheat bran [wheat bran] and Spironolactone   ROS:   Please see the history of present illness.    All other systems reviewed and are negative.   Labs/Other Tests and Data Reviewed:    Recent Labs: 12/31/2018: BNP 8.3 01/01/2019: ALT 24; BUN 13; Creat 0.96; Potassium 3.9; Sodium 135   Recent Lipid Panel Lab Results  Component Value Date/Time   CHOL 175 01/01/2019 07:38 AM   TRIG 184 (H) 01/01/2019 07:38 AM   HDL 37 (L) 01/01/2019 07:38 AM   CHOLHDL 4.7 01/01/2019 07:38 AM   LDLCALC 107 (H) 01/01/2019 07:38 AM    Wt Readings from Last 3 Encounters:  05/08/19 182 lb 9.6 oz (82.8 kg)  04/30/19 184 lb 9.6 oz (83.7 kg)  01/29/19 181 lb (82.1 kg)     Objective:    Vital Signs:  There were no vitals taken for this visit.   GEN:  alert and oriented  RESPIRATORY:  no shortness of breath noted in conversation  PSYCH:  normal affect and mood  ASSESSMENT & PLAN:    1. Type 2 diabetes mellitus with stage 1 chronic kidney disease, with long-term current use of insulin ([place])  2. Aortic valve stenosis, etiology of cardiac valve disease unspecified  3. Essential hypertension  4. Irritable bowel syndrome, unspecified type  5. Mixed hyperlipidemia  6. Overweight (BMI 25.0-29.9)    Time:   Today, I have spent 20 minutes with the patient with telehealth technology discussing the above problems.     Medication Adjustments/Labs and Tests Ordered: Current medicines are reviewed at length with the patient today.   Concerns regarding medicines are outlined above.   Tests Ordered: No orders of the defined types were placed in this encounter.   Medication Changes: No orders of the defined types were placed in this encounter.   Disposition:  Follow up June 2021 Signed, Perlie Mayo, NP  07/05/2019 8:23 AM     [place]

## 2019-07-05 NOTE — Assessment & Plan Note (Signed)
BS at home: 148 been running higher recently. Is followed by Dr Dorris Fetch.

## 2019-07-05 NOTE — Assessment & Plan Note (Signed)
Continues to have constipation. Encouraged to keep stool softeners, fiber, and water.

## 2019-07-05 NOTE — Assessment & Plan Note (Signed)
Has updated labs coming up. Encourage low fat diet.

## 2019-07-05 NOTE — Assessment & Plan Note (Signed)
BP home 139/78. Joanna Brown is encouraged to maintain a well balanced diet that is low in salt. Controlled, continue current medication regimen. Additionally, she is also reminded that exercise is beneficial for heart health and control of  Blood pressure. 30-60 minutes daily is recommended-walking was suggested.

## 2019-07-05 NOTE — Patient Instructions (Addendum)
Happy New Year! May you have a year filled with hope, love, happiness and laughter.  I appreciate the opportunity to provide you with care for your health and wellness. Today we discussed: overall health   Follow up: June 2021 in office with Dr Moshe Cipro   No labs or referrals today  Please continue to practice social distancing to keep you, your family, and our community safe.  If you must go out, please wear a mask and practice good handwashing.  It was a pleasure to see you and I look forward to continuing to work together on your health and well-being. Please do not hesitate to call the office if you need care or have questions about your care.  Have a wonderful day and week. With Gratitude, Cherly Beach, DNP, AGNP-BC

## 2019-07-05 NOTE — Assessment & Plan Note (Signed)
Joanna Brown is re-educated about the importance of exercise daily to help with weight management. A minumum of 30 minutes daily is recommended. Additionally, importance of healthy food choices  with portion control discussed.   Wt Readings from Last 3 Encounters:  05/08/19 182 lb 9.6 oz (82.8 kg)  04/30/19 184 lb 9.6 oz (83.7 kg)  01/29/19 181 lb (82.1 kg)

## 2019-07-08 ENCOUNTER — Ambulatory Visit: Payer: Medicare Other | Admitting: Family Medicine

## 2019-07-22 ENCOUNTER — Other Ambulatory Visit: Payer: Self-pay | Admitting: "Endocrinology

## 2019-07-24 ENCOUNTER — Ambulatory Visit (INDEPENDENT_AMBULATORY_CARE_PROVIDER_SITE_OTHER): Payer: Medicare Other | Admitting: Podiatry

## 2019-07-24 ENCOUNTER — Encounter: Payer: Self-pay | Admitting: Podiatry

## 2019-07-24 ENCOUNTER — Other Ambulatory Visit: Payer: Self-pay

## 2019-07-24 VITALS — Temp 97.5°F

## 2019-07-24 DIAGNOSIS — M79674 Pain in right toe(s): Secondary | ICD-10-CM | POA: Diagnosis not present

## 2019-07-24 DIAGNOSIS — M79675 Pain in left toe(s): Secondary | ICD-10-CM

## 2019-07-24 DIAGNOSIS — E1142 Type 2 diabetes mellitus with diabetic polyneuropathy: Secondary | ICD-10-CM

## 2019-07-24 DIAGNOSIS — B351 Tinea unguium: Secondary | ICD-10-CM | POA: Diagnosis not present

## 2019-07-24 NOTE — Progress Notes (Signed)
This patient returns to my office for at risk foot care.  This patient requires this care by a professional since this patient will be at risk due to having diabetes and kidney disease.   This patient is unable to cut nails themselves since the patient cannot reach their nails.These nails are painful walking and wearing shoes.  This patient presents for at risk foot care today.  General Appearance  Alert, conversant and in no acute stress.  Vascular  Dorsalis pedis and posterior tibial  pulses are palpable  bilaterally.  Capillary return is within normal limits  bilaterally. Temperature is within normal limits  bilaterally.  Neurologic  Senn-Weinstein monofilament wire test within normal limits  bilaterally. Muscle power within normal limits bilaterally.  Nails Thick disfigured discolored nails with subungual debris  from hallux to fifth toes bilaterally. No evidence of bacterial infection or drainage bilaterally.  Orthopedic  No limitations of motion  feet .  No crepitus or effusions noted.  No bony pathology or digital deformities noted.  Mild  HAV  B/L.  Skin  normotropic skin with no porokeratosis noted bilaterally.  No signs of infections or ulcers noted.     Onychomycosis  Pain in right toe  Pain in left toe.  Consent was obtained for treatment procedures.  Debridement and grinding of long thick nails with clearing of subungual debris.  No infection or ulcer.     Return office visit   10 weeks        Told patient to return for periodic foot care and evaluation due to potential at risk complications.   Gardiner Barefoot DPM

## 2019-07-29 ENCOUNTER — Ambulatory Visit (INDEPENDENT_AMBULATORY_CARE_PROVIDER_SITE_OTHER): Payer: Medicare Other

## 2019-07-29 ENCOUNTER — Ambulatory Visit: Payer: Self-pay

## 2019-07-29 ENCOUNTER — Other Ambulatory Visit: Payer: Self-pay

## 2019-07-29 VITALS — BP 139/78 | Ht 65.0 in | Wt 182.0 lb

## 2019-07-29 DIAGNOSIS — Z0000 Encounter for general adult medical examination without abnormal findings: Secondary | ICD-10-CM | POA: Diagnosis not present

## 2019-07-29 DIAGNOSIS — Z1211 Encounter for screening for malignant neoplasm of colon: Secondary | ICD-10-CM

## 2019-07-29 DIAGNOSIS — Z1231 Encounter for screening mammogram for malignant neoplasm of breast: Secondary | ICD-10-CM | POA: Diagnosis not present

## 2019-07-29 MED ORDER — METFORMIN HCL 500 MG PO TABS: ORAL_TABLET | ORAL | 2 refills | Status: DC

## 2019-07-29 NOTE — Progress Notes (Addendum)
Subjective:   Joanna Brown is a 79 y.o. female who presents for Medicare Annual (Subsequent) preventive examination.  Review of Systems:   Cardiac Risk Factors include: advanced age (>72mn, >>51women);diabetes mellitus;dyslipidemia;hypertension;sedentary lifestyle     Objective:     Vitals: BP 139/78   Ht _0  (1.651 m)   Wt 182 lb (82.6 kg)   BMI 30.29 kg/m   Body mass index is 30.29 kg/m.  Advanced Directives 07/23/2018 07/19/2017 07/19/2016 02/17/2016 07/22/2015 07/11/2014 12/02/2013  Does Patient Have a Medical Advance Directive? No No No Yes No No;Yes Patient does not have advance directive;Patient would not like information  Type of Advance Directive - - - - - Living will -  Copy of HAntonin Chart? - - - No - copy requested - No - copy requested -  Would patient like information on creating a medical advance directive? No - Patient declined Yes (ED - Information included in AVS) Yes (MAU/Ambulatory/Procedural Areas - Information given) - No - patient declined information - -  Pre-existing out of facility DNR order (yellow form or pink MOST form) - - - - - - No    Tobacco Social History   Tobacco Use  Smoking Status Never Smoker  Smokeless Tobacco Never Used     Counseling given: Not Answered   Clinical Intake:  Pre-visit preparation completed: Yes  Pain : 0-10 Pain Score: 3  Pain Type: Acute pain Pain Location: Leg Pain Orientation: Right, Left Pain Radiating Towards: leg cramps Pain Onset: 1 to 4 weeks ago     Diabetes: Yes CBG done?: No Did pt. bring in CBG monitor from home?: No  How often do you need to have someone help you when you read instructions, pamphlets, or other written materials from your doctor or pharmacy?: 1 - Never  Interpreter Needed?: No  Information entered by :: Laurabelle Gorczyca, LPN  Past Medical History:  Diagnosis Date  . Blood transfusion    1980  . Diabetes mellitus   . Dysrhythmia   . Female bladder  prolapse   . GERD 11/18/2008   Qualifier: Diagnosis of  By: MCraige Cotta   . GERD (gastroesophageal reflux disease)   . Heart disease   . Hyperlipidemia   . Hypertension    echo and stress 4/10 reports on chart, EKG ` LOV 9/12 on chart  . Mild aortic stenosis    Past Surgical History:  Procedure Laterality Date  . ABDOMINAL HYSTERECTOMY    . ANTERIOR AND POSTERIOR REPAIR  04/26/2011   Procedure: ANTERIOR (CYSTOCELE) AND POSTERIOR REPAIR (RECTOCELE);  Surgeon: SReece Packer MD;  Location: [facility];  Service: Urology;  Laterality: N/A;  . CATARACT EXTRACTION Bilateral    with IOL  . CHOLECYSTECTOMY  2009  . COLONOSCOPY N/A 12/02/2013   Procedure: COLONOSCOPY;  Surgeon: SDanie Binder MD;  [facility];  Service: Endoscopy;  Laterality: N/A;  9:30 AM - rescheduled to 8:30 - Doris to notify pt  . COLONOSCOPY W/ POLYPECTOMY    . LEFT HEART CATH  09/10/2008   normal coronary arteries, normal LV systolic function, EF 671%(Dr. HNorlene Duel  . LYMPH NODE DISSECTION Right 1997   under arm  . NM MYOCAR PERF WALL MOTION  2010   dipyridamole - mild-mod in intenstiy perfusion defect in mid anterior, mid anteroseptal wall, EF 70%  . OVARY SURGERY     bilateral tumors removed  . THYROIDECTOMY    . THYROIDECTOMY  02/2008  . TRANSTHORACIC ECHOCARDIOGRAM  08/2011   EF=>55%, mild conc LVH; trace MR; mild TR; mild-mod AV calcification with mild valvular AV stenosis  . VAGINAL PROLAPSE REPAIR  04/26/2011   Procedure: VAGINAL VAULT SUSPENSION;  Surgeon: Reece Packer, MD;  Location: [facility];  Service: Urology;  Laterality: N/A;  with Graft  10x6   Family History  Problem Relation Age of Onset  . Stomach cancer Mother 46  . Heart disease Mother 38       heart disease  . Heart disease Father 67       MI  . Stroke Maternal Grandfather   . Heart attack Paternal Grandfather   . Hypertension Brother   . Bone cancer Brother   . Hypertension Sister   . Liver cancer Sister   .  Hypertension Sister   . Hypertension Child    Social History   Socioeconomic History  . Marital status: Married    Spouse name: [name]  . Number of children: 1  . Years of education: Trade  . Highest education level: 12th grade  Occupational History  . Occupation: Retired  Tobacco Use  . Smoking status: Never Smoker  . Smokeless tobacco: Never Used  Substance and Sexual Activity  . Alcohol use: No    Comment: socially- none x 30 years  . Drug use: No  . Sexual activity: Yes  Other Topics Concern  . Not on file  Social History Narrative   Patient lives at home with spouse. 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 LIVED IN [place] FOR 50 YRS AND CAME BACK TO [place] ~2009. Caffeine Use: 1 cup of coffee daily. HAD ONE CHILD: PASSED 5 YRS. HAVE THREE GRAND-KIDS AND TWO GREAT GRANDS.    Social Determinants of Health   Financial Resource Strain: Low Risk   . Difficulty of Paying Living Expenses: Not hard at all  Food Insecurity:   . Worried About Charity fundraiser in the Last Year:   . Arboriculturist in the Last Year:   Transportation Needs: No Transportation Needs  . Lack of Transportation (Medical): No  . Lack of Transportation (Non-Medical): No  Physical Activity: Insufficiently Active  . Days of Exercise per Week: 3 days  . Minutes of Exercise per Session: 20 min  Stress: No Stress Concern Present  . Feeling of Stress : Only a little  Social Connections: Not Isolated  . Frequency of Communication with Friends and Family: Twice a week  . Frequency of Social Gatherings with Friends and Family: Twice a week  . Attends Religious Services: More than 4 times per year  . Active Member of Clubs or Organizations: No  . Attends Archivist Meetings: 1 to 4 times per year  . Marital Status: Married    Outpatient Encounter Medications as of 07/29/2019  Medication Sig  . amLODipine (NORVASC) 10 MG tablet TAKE 1 TABLET(10 MG) BY MOUTH EVERY MORNING    . aspirin 81 MG tablet Take 81 mg by mouth every morning.   . B-D ULTRAFINE III SHORT PEN 31G X 8 MM MISC USE AS DIRECTED AT BEDTIME WITH INSULIN PENS  . benazepril (LOTENSIN) 40 MG tablet Take 1 tablet (40 mg total) by mouth daily.  . Blood Glucose Monitoring Suppl (ACCU-CHEK GUIDE) w/Device KIT 1 each by Does not apply route 4 (four) times daily.  . Calcium Carbonate-Vit D-Min (CALCIUM 1200) 1200-1000 MG-UNIT CHEW Chew 1 each by mouth daily.  . cholecalciferol (VITAMIN  D3) 25 MCG (1000 UT) tablet Take 1,000 Units by mouth daily.  Marland Kitchen ezetimibe (ZETIA) 10 MG tablet Take 1 tablet (10 mg total) by mouth daily.  . ferrous sulfate 325 (65 FE) MG tablet Take 325 mg by mouth daily with breakfast.  . glipiZIDE (GLUCOTROL XL) 5 MG 24 hr tablet TAKE 1 TABLET(5 MG) BY MOUTH DAILY WITH BREAKFAST  . glucose blood (ACCU-CHEK GUIDE) test strip Use as instructed q.i.d  E11.65  . Lancets Ultra Fine MISC 1 each by Does not apply route 4 (four) times daily.  Marland Kitchen latanoprost (XALATAN) 0.005 % ophthalmic solution PLACE 1 GTT INTO BOTH EYE QHS  . LEVEMIR FLEXTOUCH 100 UNIT/ML FlexPen INJECT 80 UNITS INTO THE SKIN AT BEDTIME.  . metFORMIN (GLUCOPHAGE) 500 MG tablet TAKE 1 TABLET BY MOUTH EVERY DAY WITH BREAKFAST  . Multiple Vitamins-Minerals (CENTRUM) tablet Take 1 tablet by mouth every morning.   . NONFORMULARY OR COMPOUNDED ITEM Peripheral Neuropathy Cream: Bupivacaine 1%, Doxepin 3%, Gabapentin 6%, Pentoxifylline 3%, Topiramate 1% Order faxed to [facility]  . Nutritional Supplements (NUTRITIONAL SUPPLEMENT PO) Take by mouth. Neurx-TF (formulation for peripheral nerve health)  . Omega-3 Fatty Acids (FISH OIL) 1200 MG CAPS Take 1 capsule by mouth every morning.   . pravastatin (PRAVACHOL) 80 MG tablet TAKE 1 TABLET(80 MG) BY MOUTH DAILY  . Travoprost, BAK Free, (TRAVATAN) 0.004 % SOLN ophthalmic solution Place 1 drop into both eyes at bedtime.   . triamterene-hydrochlorothiazide (MAXZIDE) 75-50 MG tablet  Take 1 tablet by mouth daily.  Marland Kitchen UNABLE TO FIND Diabetic shoes x 1  Inserts x 3  Dx E11.9  . [DISCONTINUED] metFORMIN (GLUCOPHAGE) 500 MG tablet TAKE 1 TABLET BY MOUTH EVERY DAY WITH BREAKFAST   No facility-administered encounter medications on file as of 07/29/2019.    Activities of Daily Living In your present state of health, do you have any difficulty performing the following activities: 07/29/2019  Hearing? N  Vision? N  Difficulty concentrating or making decisions? N  Walking or climbing stairs? N  Dressing or bathing? N  Doing errands, shopping? N  Preparing Food and eating ? N  Using the Toilet? N  In the past six months, have you accidently leaked urine? N  Do you have problems with loss of bowel control? N  Managing your Medications? N  Managing your Finances? N  Housekeeping or managing your Housekeeping? N  Some recent data might be hidden    Patient Care Team: Fayrene Helper, MD as PCP - General [place], Nadean Corwin, MD as PCP - Cardiology (Cardiology) [name] Nadean Corwin, MD as Attending Physician (Cardiology) Rutherford Guys, MD as Attending Physician (Ophthalmology) Arlana Lindau, RD as Dietitian (Nutrition)    Assessment:   This is a routine wellness examination for Joanna Brown.  Exercise Activities and Dietary recommendations Current Exercise Habits: The patient does not participate in regular exercise at present, Exercise limited by: None identified  Goals    . DIET - INCREASE WATER INTAKE    . HEMOGLOBIN A1C < 7.0     A1C 8.7 07/05/2016   Will continue see nutritionist  Will work with pcp to manage medications        Fall Risk Fall Risk  07/29/2019 07/05/2019 01/02/2019 10/04/2018 08/01/2018  Falls in the past year? 0 0 0 0 0  Number falls in past yr: 0 0 0 - 0  Injury with Fall? 0 0 0 - 0  Risk for fall due to : - - - - -  Follow up - Falls evaluation completed;Education provided - Falls evaluation completed -   Is the patient's home free of loose  throw rugs in walkways, pet beds, electrical cords, etc?   yes      Grab bars in the bathroom? no      Handrails on the stairs?   yes      Adequate lighting?   yes  Timed Get Up and Go performed: visit done by phone   Depression Screen PHQ 2/9 Scores 07/05/2019 01/02/2019 08/01/2018 07/23/2018  PHQ - 2 Score 0 0 0 3  PHQ- 9 Score - - 5 7     Cognitive Function MMSE - Mini Mental State Exam 07/23/2014  Orientation to time 5  Orientation to Place 5  Registration 3  Attention/ Calculation 4  Recall 1  Language- name 2 objects 2  Language- repeat 1  Language- follow 3 step command 3  Language- read & follow direction 1  Write a sentence 1  Copy design 1  Total score 27     6CIT Screen 07/29/2019 07/29/2019 07/23/2018 07/19/2017  What Year? 0 points 0 points 0 points 0 points  What month? 0 points 0 points 0 points -  What time? 0 points 0 points 0 points 0 points  Count back from 20 0 points 0 points 0 points -  Months in reverse 0 points - 0 points 0 points  Repeat phrase 0 points - 0 points 0 points  Total Score 0 - 0 -    Immunization History  Administered Date(s) Administered  . Pneumococcal Conjugate-13 12/11/2013  . Pneumococcal Polysaccharide-23 01/13/2010  . Tdap 10/05/2010    Qualifies for Shingles Vaccine? Yes  Screening Tests Health Maintenance  Topic Date Due  . INFLUENZA VACCINE  08/14/2019 (Originally 12/15/2018)  . OPHTHALMOLOGY EXAM  09/25/2019  . HEMOGLOBIN A1C  11/06/2019  . FOOT EXAM  05/14/2020  . TETANUS/TDAP  10/04/2020  . DEXA SCAN  Completed  . PNA vac Low Risk Adult  Completed    Cancer Screenings: Lung: Low Dose CT Chest recommended if Age 23-80 years, 30 pack-year currently smoking OR have quit w/in 15years. Patient does not qualify. Breast:  Up to date on Mammogram? Yes   Up to date of Bone Density/Dexa? Yes Colorectal: wants cologuard sent   Additional Screenings:  Hepatitis C Screening: complete     Plan:     I have personally  reviewed and noted the following in the patient's chart:   . Medical and social history . Use of alcohol, tobacco or illicit drugs  . Current medications and supplements . Functional ability and status . Nutritional status . Physical activity . Advanced directives . List of other physicians . Hospitalizations, surgeries, and ER visits in previous 12 months . Vitals . Screenings to include cognitive, depression, and falls . Referrals and appointments  In addition, I have reviewed and discussed with patient certain preventive protocols, quality metrics, and best practice recommendations. A written personalized care plan for preventive services as well as general preventive health recommendations were provided to patient.     Kate Sable, LPN, LPN  5/32/9924

## 2019-07-29 NOTE — Patient Instructions (Addendum)
Joanna Brown , Thank you for taking time to come for your Medicare Wellness Visit. I appreciate your ongoing commitment to your health goals. Please review the following plan we discussed and let me know if I can assist you in the future.   Screening recommendations/referrals: Colonoscopy: cologuard ordered Mammogram: to be scheduled  Bone Density: up to date Recommended yearly ophthalmology/optometry visit for glaucoma screening and checkup Recommended yearly dental visit for hygiene and checkup  Vaccinations: Influenza vaccine: up to date Pneumococcal vaccine: up to date  Tdap vaccine: up to date  Shingles vaccine: qualifies to get at pharmacy     Advanced directives:   Conditions/risks identified: referred for mammogram, cologuard ordered   Next appointment: 11/06/2019 8:00am   Preventive Care 38 Years and Older, Female Preventive care refers to lifestyle choices and visits with your health care provider that can promote health and wellness. What does preventive care include?  A yearly physical exam. This is also called an annual well check.  Dental exams once or twice a year.  Routine eye exams. Ask your health care provider how often you should have your eyes checked.  Personal lifestyle choices, including:  Daily care of your teeth and gums.  Regular physical activity.  Eating a healthy diet.  Avoiding tobacco and drug use.  Limiting alcohol use.  Practicing safe sex.  Taking low-dose aspirin every day.  Taking vitamin and mineral supplements as recommended by your health care provider. What happens during an annual well check? The services and screenings done by your health care provider during your annual well check will depend on your age, overall health, lifestyle risk factors, and family history of disease. Counseling  Your health care provider may ask you questions about your:  Alcohol use.  Tobacco use.  Drug use.  Emotional well-being.  Home  and relationship well-being.  Sexual activity.  Eating habits.  History of falls.  Memory and ability to understand (cognition).  Work and work Statistician.  Reproductive health. Screening  You may have the following tests or measurements:  Height, weight, and BMI.  Blood pressure.  Lipid and cholesterol levels. These may be checked every 5 years, or more frequently if you are over 9 years old.  Skin check.  Lung cancer screening. You may have this screening every year starting at age 36 if you have a 30-pack-year history of smoking and currently smoke or have quit within the past 15 years.  Fecal occult blood test (FOBT) of the stool. You may have this test every year starting at age 31.  Flexible sigmoidoscopy or colonoscopy. You may have a sigmoidoscopy every 5 years or a colonoscopy every 10 years starting at age 58.  Hepatitis C blood test.  Hepatitis B blood test.  Sexually transmitted disease (STD) testing.  Diabetes screening. This is done by checking your blood sugar (glucose) after you have not eaten for a while (fasting). You may have this done every 1-3 years.  Bone density scan. This is done to screen for osteoporosis. You may have this done starting at age 73.  Mammogram. This may be done every 1-2 years. Talk to your health care provider about how often you should have regular mammograms. Talk with your health care provider about your test results, treatment options, and if necessary, the need for more tests. Vaccines  Your health care provider may recommend certain vaccines, such as:  Influenza vaccine. This is recommended every year.  Tetanus, diphtheria, and acellular pertussis (Tdap, Td) vaccine. You  may need a Td booster every 10 years.  Zoster vaccine. You may need this after age 6.  Pneumococcal 13-valent conjugate (PCV13) vaccine. One dose is recommended after age 86.  Pneumococcal polysaccharide (PPSV23) vaccine. One dose is recommended  after age 97. Talk to your health care provider about which screenings and vaccines you need and how often you need them. This information is not intended to replace advice given to you by your health care provider. Make sure you discuss any questions you have with your health care provider. Document Released: 05/29/2015 Document Revised: 01/20/2016 Document Reviewed: 03/03/2015 Elsevier Interactive Patient Education  2017 [employer] Prevention in the Home Falls can cause injuries. They can happen to people of all ages. There are many things you can do to make your home safe and to help prevent falls. What can I do on the outside of my home?  Regularly fix the edges of walkways and driveways and fix any cracks.  Remove anything that might make you trip as you walk through a door, such as a raised step or threshold.  Trim any bushes or trees on the path to your home.  Use bright outdoor lighting.  Clear any walking paths of anything that might make someone trip, such as rocks or tools.  Regularly check to see if handrails are loose or broken. Make sure that both sides of any steps have handrails.  Any raised decks and porches should have guardrails on the edges.  Have any leaves, snow, or ice cleared regularly.  Use sand or salt on walking paths during winter.  Clean up any spills in your garage right away. This includes oil or grease spills. What can I do in the bathroom?  Use night lights.  Install grab bars by the toilet and in the tub and shower. Do not use towel bars as grab bars.  Use non-skid mats or decals in the tub or shower.  If you need to sit down in the shower, use a plastic, non-slip stool.  Keep the floor dry. Clean up any water that spills on the floor as soon as it happens.  Remove soap buildup in the tub or shower regularly.  Attach bath mats securely with double-sided non-slip rug tape.  Do not have throw rugs and other things on the floor  that can make you trip. What can I do in the bedroom?  Use night lights.  Make sure that you have a light by your bed that is easy to reach.  Do not use any sheets or blankets that are too big for your bed. They should not hang down onto the floor.  Have a firm chair that has side arms. You can use this for support while you get dressed.  Do not have throw rugs and other things on the floor that can make you trip. What can I do in the kitchen?  Clean up any spills right away.  Avoid walking on wet floors.  Keep items that you use a lot in easy-to-reach places.  If you need to reach something above you, use a strong step stool that has a grab bar.  Keep electrical cords out of the way.  Do not use floor polish or wax that makes floors slippery. If you must use wax, use non-skid floor wax.  Do not have throw rugs and other things on the floor that can make you trip. What can I do with my stairs?  Do not leave any items  on the stairs.  Make sure that there are handrails on both sides of the stairs and use them. Fix handrails that are broken or loose. Make sure that handrails are as long as the stairways.  Check any carpeting to make sure that it is firmly attached to the stairs. Fix any carpet that is loose or worn.  Avoid having throw rugs at the top or bottom of the stairs. If you do have throw rugs, attach them to the floor with carpet tape.  Make sure that you have a light switch at the top of the stairs and the bottom of the stairs. If you do not have them, ask someone to add them for you. What else can I do to help prevent falls?  Wear shoes that:  Do not have high heels.  Have rubber bottoms.  Are comfortable and fit you well.  Are closed at the toe. Do not wear sandals.  If you use a stepladder:  Make sure that it is fully opened. Do not climb a closed stepladder.  Make sure that both sides of the stepladder are locked into place.  Ask someone to hold it  for you, if possible.  Clearly mark and make sure that you can see:  Any grab bars or handrails.  First and last steps.  Where the edge of each step is.  Use tools that help you move around (mobility aids) if they are needed. These include:  Canes.  Walkers.  Scooters.  Crutches.  Turn on the lights when you go into a dark area. Replace any light bulbs as soon as they burn out.  Set up your furniture so you have a clear path. Avoid moving your furniture around.  If any of your floors are uneven, fix them.  If there are any pets around you, be aware of where they are.  Review your medicines with your doctor. Some medicines can make you feel dizzy. This can increase your chance of falling. Ask your doctor what other things that you can do to help prevent falls. This information is not intended to replace advice given to you by your health care provider. Make sure you discuss any questions you have with your health care provider. Document Released: 02/26/2009 Document Revised: 10/08/2015 Document Reviewed: 06/06/2014 Elsevier Interactive Patient Education  2017 [employer].

## 2019-07-29 NOTE — Addendum Note (Signed)
Addended by: Eual Fines on: 07/29/2019 09:53 AM   Modules accepted: Orders

## 2019-07-30 ENCOUNTER — Encounter: Payer: Medicare Other | Admitting: Family Medicine

## 2019-07-30 DIAGNOSIS — Z794 Long term (current) use of insulin: Secondary | ICD-10-CM | POA: Diagnosis not present

## 2019-07-30 DIAGNOSIS — I350 Nonrheumatic aortic (valve) stenosis: Secondary | ICD-10-CM | POA: Diagnosis not present

## 2019-07-30 DIAGNOSIS — E663 Overweight: Secondary | ICD-10-CM | POA: Diagnosis not present

## 2019-07-30 DIAGNOSIS — E782 Mixed hyperlipidemia: Secondary | ICD-10-CM | POA: Diagnosis not present

## 2019-07-30 DIAGNOSIS — I10 Essential (primary) hypertension: Secondary | ICD-10-CM | POA: Diagnosis not present

## 2019-07-30 DIAGNOSIS — R0989 Other specified symptoms and signs involving the circulatory and respiratory systems: Secondary | ICD-10-CM | POA: Diagnosis not present

## 2019-07-30 DIAGNOSIS — K589 Irritable bowel syndrome without diarrhea: Secondary | ICD-10-CM | POA: Diagnosis not present

## 2019-07-30 DIAGNOSIS — K828 Other specified diseases of gallbladder: Secondary | ICD-10-CM | POA: Diagnosis not present

## 2019-07-30 DIAGNOSIS — N181 Chronic kidney disease, stage 1: Secondary | ICD-10-CM | POA: Diagnosis not present

## 2019-07-30 DIAGNOSIS — E1121 Type 2 diabetes mellitus with diabetic nephropathy: Secondary | ICD-10-CM | POA: Diagnosis not present

## 2019-07-30 DIAGNOSIS — E1122 Type 2 diabetes mellitus with diabetic chronic kidney disease: Secondary | ICD-10-CM | POA: Diagnosis not present

## 2019-07-31 LAB — LIPID PANEL
Cholesterol: 161 mg/dL (ref <200)
HDL: 34 mg/dL — ABNORMAL LOW (ref >=50)
LDL Cholesterol (Calc): 99 mg/dL (calc)
Non-HDL Cholesterol (Calc): 127 mg/dL (calc) (ref <130)
Total CHOL/HDL Ratio: 4.7 (calc) (ref <5.0)
Triglycerides: 179 mg/dL — ABNORMAL HIGH (ref <150)

## 2019-07-31 LAB — COMPLETE METABOLIC PANEL WITH GFR
AG Ratio: 1.4 (calc) (ref 1.0–2.5)
ALT: 25 U/L (ref 6–29)
AST: 25 U/L (ref 10–35)
Albumin: 4 g/dL (ref 3.6–5.1)
Alkaline phosphatase (APISO): 47 U/L (ref 37–153)
BUN/Creatinine Ratio: 19 (calc) (ref 6–22)
BUN: 18 mg/dL (ref 7–25)
CO2: 31 mmol/L (ref 20–32)
Calcium: 9.8 mg/dL (ref 8.6–10.4)
Chloride: 101 mmol/L (ref 98–110)
Creat: 0.96 mg/dL — ABNORMAL HIGH (ref 0.60–0.93)
GFR, Est African American: 66 mL/min/{1.73_m2} (ref >=60)
GFR, Est Non African American: 57 mL/min/{1.73_m2} — ABNORMAL LOW (ref >=60)
Globulin: 2.8 g/dL (calc) (ref 1.9–3.7)
Glucose, Bld: 191 mg/dL — ABNORMAL HIGH (ref 65–99)
Potassium: 4.2 mmol/L (ref 3.5–5.3)
Sodium: 140 mmol/L (ref 135–146)
Total Bilirubin: 0.3 mg/dL (ref 0.2–1.2)
Total Protein: 6.8 g/dL (ref 6.1–8.1)

## 2019-07-31 LAB — T4, FREE: Free T4: 1.2 ng/dL (ref 0.8–1.8)

## 2019-07-31 LAB — MICROALBUMIN / CREATININE URINE RATIO
Creatinine, Urine: 87 mg/dL (ref 20–275)
Microalb Creat Ratio: 74 mcg/mg creat — ABNORMAL HIGH (ref <30)
Microalb, Ur: 6.4 mg/dL

## 2019-07-31 LAB — VITAMIN D 25 HYDROXY (VIT D DEFICIENCY, FRACTURES): Vit D, 25-Hydroxy: 50 ng/mL (ref 30–100)

## 2019-07-31 LAB — TSH: TSH: 3.26 mIU/L (ref 0.40–4.50)

## 2019-08-01 ENCOUNTER — Encounter: Payer: Self-pay | Admitting: Family Medicine

## 2019-08-01 DIAGNOSIS — Z1211 Encounter for screening for malignant neoplasm of colon: Secondary | ICD-10-CM | POA: Diagnosis not present

## 2019-08-03 LAB — COLOGUARD: Cologuard: POSITIVE — AB

## 2019-08-08 ENCOUNTER — Other Ambulatory Visit: Payer: Self-pay

## 2019-08-08 ENCOUNTER — Encounter: Payer: Self-pay | Admitting: "Endocrinology

## 2019-08-08 ENCOUNTER — Ambulatory Visit (INDEPENDENT_AMBULATORY_CARE_PROVIDER_SITE_OTHER): Payer: Medicare Other | Admitting: "Endocrinology

## 2019-08-08 VITALS — BP 120/72 | HR 76 | Ht 65.0 in | Wt 182.6 lb

## 2019-08-08 DIAGNOSIS — E782 Mixed hyperlipidemia: Secondary | ICD-10-CM | POA: Diagnosis not present

## 2019-08-08 DIAGNOSIS — Z794 Long term (current) use of insulin: Secondary | ICD-10-CM

## 2019-08-08 DIAGNOSIS — E1122 Type 2 diabetes mellitus with diabetic chronic kidney disease: Secondary | ICD-10-CM | POA: Diagnosis not present

## 2019-08-08 DIAGNOSIS — N181 Chronic kidney disease, stage 1: Secondary | ICD-10-CM | POA: Diagnosis not present

## 2019-08-08 DIAGNOSIS — I10 Essential (primary) hypertension: Secondary | ICD-10-CM

## 2019-08-08 LAB — POCT GLYCOSYLATED HEMOGLOBIN (HGB A1C): Hemoglobin A1C: 9 % — AB (ref 4.0–5.6)

## 2019-08-08 MED ORDER — GLIPIZIDE ER 5 MG PO TB24: ORAL_TABLET | ORAL | 3 refills | Status: DC

## 2019-08-08 MED ORDER — LEVEMIR FLEXTOUCH 100 UNIT/ML ~~LOC~~ SOPN: 50.0000 [IU] | PEN_INJECTOR | Freq: Two times a day (BID) | SUBCUTANEOUS | 2 refills | Status: DC

## 2019-08-08 NOTE — Patient Instructions (Signed)

## 2019-08-08 NOTE — Progress Notes (Signed)
08/08/2019                          Endocrinology follow-up note   Subjective:    Patient ID: Joanna Brown, female    DOB: 07/05/1940. She Patient is being engaged in telehealth via telephone in follow-up for the management of currently uncontrolled type 2 diabetes, and associated hyperlipidemia and hypertension. PMD:   Fayrene Helper, MD  Past Medical History:  Diagnosis Date  . Blood transfusion    1980  . Diabetes mellitus   . Dysrhythmia   . Female bladder prolapse   . GERD 11/18/2008   Qualifier: Diagnosis of  By: Craige Cotta    . GERD (gastroesophageal reflux disease)   . Heart disease   . Hyperlipidemia   . Hypertension    echo and stress 4/10 reports on chart, EKG ` LOV 9/12 on chart  . Mild aortic stenosis    Past Surgical History:  Procedure Laterality Date  . ABDOMINAL HYSTERECTOMY    . ANTERIOR AND POSTERIOR REPAIR  04/26/2011   Procedure: ANTERIOR (CYSTOCELE) AND POSTERIOR REPAIR (RECTOCELE);  Surgeon: Reece Packer, MD;  Location: [facility];  Service: Urology;  Laterality: N/A;  . CATARACT EXTRACTION Bilateral    with IOL  . CHOLECYSTECTOMY  2009  . COLONOSCOPY N/A 12/02/2013   Procedure: COLONOSCOPY;  Surgeon: Danie Binder, MD;  Location: [facility];  Service: Endoscopy;  Laterality: N/A;  9:30 AM - rescheduled to 8:30 - Doris to notify pt  . COLONOSCOPY W/ POLYPECTOMY    . LEFT HEART CATH  09/10/2008   normal coronary arteries, normal LV systolic function, EF 21% (Dr. [name])  . LYMPH NODE DISSECTION Right 1997   under arm  . NM MYOCAR PERF WALL MOTION  2010   dipyridamole - mild-mod in intenstiy perfusion defect in mid anterior, mid anteroseptal wall, EF 70%  . OVARY SURGERY     bilateral tumors removed  . THYROIDECTOMY    . THYROIDECTOMY  02/2008  . TRANSTHORACIC ECHOCARDIOGRAM  08/2011   EF=>55%, mild conc LVH; trace MR; mild TR; mild-mod AV calcification with mild valvular AV stenosis  . VAGINAL PROLAPSE REPAIR  04/26/2011    Procedure: VAGINAL VAULT SUSPENSION;  Surgeon: Reece Packer, MD;  Location: [facility];  Service: Urology;  Laterality: N/A;  with Graft  10x6   Social History   Socioeconomic History  . Marital status: Married    Spouse name: [name]  . Number of children: 1  . Years of education: Trade  . Highest education level: 12th grade  Occupational History  . Occupation: Retired  Tobacco Use  . Smoking status: Never Smoker  . Smokeless tobacco: Never Used  Substance and Sexual Activity  . Alcohol use: No    Comment: socially- none x 30 years  . Drug use: No  . Sexual activity: Yes  Other Topics Concern  . Not on file  Social History Narrative   Patient lives at home with spouse. 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 LIVED IN [place] FOR 50 YRS AND CAME BACK TO [place] ~2009. Caffeine Use: 1 cup of coffee daily. HAD ONE CHILD: PASSED 5 YRS. HAVE THREE GRAND-KIDS AND TWO GREAT GRANDS.    Social Determinants of Health   Financial Resource Strain: Low Risk   . Difficulty of Paying Living Expenses: Not hard at all  Food Insecurity:   . Worried About Charity fundraiser  in the Last Year:   . [place] in the Last Year:   Transportation Needs: No Transportation Needs  . Lack of Transportation (Medical): No  . Lack of Transportation (Non-Medical): No  Physical Activity: Insufficiently Active  . Days of Exercise per Week: 3 days  . Minutes of Exercise per Session: 20 min  Stress: No Stress Concern Present  . Feeling of Stress : Only a little  Social Connections: Not Isolated  . Frequency of Communication with Friends and Family: Twice a week  . Frequency of Social Gatherings with Friends and Family: Twice a week  . Attends Religious Services: More than 4 times per year  . Active Member of Clubs or Organizations: No  . Attends Archivist Meetings: 1 to 4 times per year  . Marital Status: Married   Outpatient Encounter Medications as of 08/08/2019   Medication Sig  . amLODipine (NORVASC) 10 MG tablet TAKE 1 TABLET(10 MG) BY MOUTH EVERY MORNING  . aspirin 81 MG tablet Take 81 mg by mouth every morning.   . B-D ULTRAFINE III SHORT PEN 31G X 8 MM MISC USE AS DIRECTED AT BEDTIME WITH INSULIN PENS  . benazepril (LOTENSIN) 40 MG tablet Take 1 tablet (40 mg total) by mouth daily.  . Blood Glucose Monitoring Suppl (ACCU-CHEK GUIDE) w/Device KIT 1 each by Does not apply route 4 (four) times daily.  . Calcium Carbonate-Vit D-Min (CALCIUM 1200) 1200-1000 MG-UNIT CHEW Chew 1 each by mouth daily.  . cholecalciferol (VITAMIN D3) 25 MCG (1000 UT) tablet Take 1,000 Units by mouth daily.  Marland Kitchen ezetimibe (ZETIA) 10 MG tablet Take 1 tablet (10 mg total) by mouth daily.  . ferrous sulfate 325 (65 FE) MG tablet Take 325 mg by mouth daily with breakfast.  . glipiZIDE (GLUCOTROL XL) 5 MG 24 hr tablet TAKE 2 TABLET BY MOUTH DAILY WITH BREAKFAST  . glucose blood (ACCU-CHEK GUIDE) test strip Use as instructed q.i.d  E11.65  . insulin detemir (LEVEMIR FLEXTOUCH) 100 UNIT/ML FlexPen Inject 50 Units into the skin 2 (two) times daily.  . Lancets Ultra Fine MISC 1 each by Does not apply route 4 (four) times daily.  Marland Kitchen latanoprost (XALATAN) 0.005 % ophthalmic solution PLACE 1 GTT INTO BOTH EYE QHS  . metFORMIN (GLUCOPHAGE) 500 MG tablet TAKE 1 TABLET BY MOUTH EVERY DAY WITH BREAKFAST  . Multiple Vitamins-Minerals (CENTRUM) tablet Take 1 tablet by mouth every morning.   . NONFORMULARY OR COMPOUNDED ITEM Peripheral Neuropathy Cream: Bupivacaine 1%, Doxepin 3%, Gabapentin 6%, Pentoxifylline 3%, Topiramate 1% Order [facility] to [facility]  . Nutritional Supplements (NUTRITIONAL SUPPLEMENT PO) Take by mouth. Neurx-TF (formulation for peripheral nerve health)  . Omega-3 Fatty Acids (FISH OIL) 1200 MG CAPS Take 1 capsule by mouth every morning.   . pravastatin (PRAVACHOL) 80 MG tablet TAKE 1 TABLET(80 MG) BY MOUTH DAILY  . Travoprost, BAK Free, (TRAVATAN) 0.004 % SOLN  ophthalmic solution Place 1 drop into both eyes at bedtime.   . triamterene-hydrochlorothiazide (MAXZIDE) 75-50 MG tablet Take 1 tablet by mouth daily.  Marland Kitchen UNABLE TO FIND Diabetic shoes x 1  Inserts x 3  Dx E11.9  . [DISCONTINUED] glipiZIDE (GLUCOTROL XL) 5 MG 24 hr tablet TAKE 1 TABLET(5 MG) BY MOUTH DAILY WITH BREAKFAST  . [DISCONTINUED] LEVEMIR FLEXTOUCH 100 UNIT/ML FlexPen INJECT 80 UNITS INTO THE SKIN AT BEDTIME.   No facility-administered encounter medications on file as of 08/08/2019.   ALLERGIES: Allergies  Allergen Reactions  . Senokot Wheat Bran [Wheat  Bran]     ABDOMINAL CRAMPS  . Spironolactone     Stomach problems, vision changes    VACCINATION STATUS: Immunization History  Administered Date(s) Administered  . Pneumococcal Conjugate-13 12/11/2013  . Pneumococcal Polysaccharide-23 01/13/2010  . Tdap 10/05/2010    Diabetes She presents for her follow-up diabetic visit. She has type 2 diabetes mellitus. Onset time: She was diagnosed at approximate age of 83 years. Her disease course has been worsening. There are no hypoglycemic associated symptoms. Pertinent negatives for hypoglycemia include no confusion, headaches, pallor or seizures. Associated symptoms include polydipsia and polyuria. Pertinent negatives for diabetes include no blurred vision, no chest pain, no fatigue and no polyphagia. There are no hypoglycemic complications. Symptoms are worsening. Diabetic complications include nephropathy and retinopathy. Risk factors for coronary artery disease include dyslipidemia, diabetes mellitus, obesity and sedentary lifestyle. Her weight is fluctuating minimally. She is following a generally unhealthy diet. When asked about meal planning, she reported none. She has had a previous visit with a dietitian. She rarely participates in exercise. Her home blood glucose trend is increasing steadily. Her breakfast blood glucose range is generally 140-180 mg/dl. Her bedtime blood glucose  range is generally >200 mg/dl. Her overall blood glucose range is 180-200 mg/dl. Eye exam is current.  Hyperlipidemia This is a chronic problem. The current episode started more than 1 year ago. The problem is uncontrolled. Exacerbating diseases include diabetes and obesity. Pertinent negatives include no chest pain, myalgias or shortness of breath. Current antihyperlipidemic treatment includes statins. Risk factors for coronary artery disease include dyslipidemia, diabetes mellitus, hypertension, obesity, a sedentary lifestyle and post-menopausal.  Hypertension This is a chronic problem. The current episode started more than 1 year ago. Pertinent negatives include no blurred vision, chest pain, headaches, palpitations or shortness of breath. Risk factors for coronary artery disease include dyslipidemia, diabetes mellitus, obesity and sedentary lifestyle. Hypertensive end-organ damage includes retinopathy.     Review of systems  Constitutional: + Minimally fluctuating body weight,  current  Body mass index is 30.39 kg/m. , no fatigue, no subjective hyperthermia, no subjective hypothermia Eyes: no blurry vision, no xerophthalmia ENT: no sore throat, no nodules palpated in throat, no dysphagia/odynophagia, no hoarseness Cardiovascular: no Chest Pain, no Shortness of Breath, no palpitations, no leg swelling Respiratory: no cough, no shortness of breath Gastrointestinal: no Nausea/Vomiting/Diarhhea Musculoskeletal: no muscle/joint aches Skin: no rashes, no hyperemia Neurological: no tremors, no numbness, no tingling, no dizziness Psychiatric: no depression, no anxiety   Objective:    BP 120/72   Pulse 76   Ht '5\' 5"'  (1.651 m)   Wt 182 lb 9.6 oz (82.8 kg)   BMI 30.39 kg/m   Wt Readings from Last 3 Encounters:  08/08/19 182 lb 9.6 oz (82.8 kg)  07/29/19 182 lb (82.6 kg)  07/05/19 182 lb (82.6 kg)    Physical Exam- Limited  Constitutional:  Body mass index is 30.39 kg/m. , not in acute  distress, normal state of mind Eyes:  EOMI, no exophthalmos Neck: Supple Thyroid: No gross goiter Respiratory: Adequate breathing efforts Musculoskeletal: no gross deformities, strength intact in all four extremities, no gross restriction of joint movements Skin:  no rashes, no hyperemia Neurological: no tremor with outstretched hands,    CMP ( most recent) CMP     Component Value Date/Time   NA 140 07/30/2019 0718   NA 135 12/31/2018 0915   K 4.2 07/30/2019 0718   CL 101 07/30/2019 0718   CO2 31 07/30/2019 0718   GLUCOSE 191 (  H) 07/30/2019 0718   BUN 18 07/30/2019 0718   BUN 13 12/31/2018 0915   CREATININE 0.96 (H) 07/30/2019 0718   CALCIUM 9.8 07/30/2019 0718   PROT 6.8 07/30/2019 0718   PROT 7.4 12/31/2018 0915   ALBUMIN 4.6 12/31/2018 0915   AST 25 07/30/2019 0718   ALT 25 07/30/2019 0718   ALKPHOS 57 12/31/2018 0915   BILITOT 0.3 07/30/2019 0718   BILITOT 0.2 12/31/2018 0915   GFRNONAA 57 (L) 07/30/2019 0718   GFRAA 66 07/30/2019 0718    Diabetic Labs (most recent): Lab Results  Component Value Date   HGBA1C 9.0 (A) 08/08/2019   HGBA1C 8.3 (A) 05/08/2019   HGBA1C 8.9 (H) 01/01/2019    Lipid Panel     Component Value Date/Time   CHOL 161 07/30/2019 0718   TRIG 179 (H) 07/30/2019 0718   HDL 34 (L) 07/30/2019 0718   CHOLHDL 4.7 07/30/2019 0718   VLDL 39 12/05/2017 0832   LDLCALC 99 07/30/2019 0718     Assessment & Plan:   1. Type 2 diabetes mellitus with stage 1 chronic kidney disease, with long-term current use of insulin ([place])  - Patient has currently uncontrolled symptomatic type 2 DM since  79 years of age. - She presents with significant above target glycemic profile, both fasting and postprandial.  Her point-of-care A1c is 9%, increasing from 8.3%.  She denies, did not document any hypoglycemia.    Recent labs reviewed, showing improving renal function.     Her diabetes is complicated by CKD obesity/sedentary life and patient remains at a high  risk for more acute and chronic complications of diabetes which include CAD, CVA, CKD, retinopathy, and neuropathy. These are all discussed in detail with the patient.  - I have counseled the patient on diet management and weight loss, by adopting a carbohydrate restricted/protein rich diet.  -She still admits to dietary indiscretions including consumption of sweets and sweetened beverages.  - she  admits there is a room for improvement in her diet and drink choices. -  Suggestion is made for her to avoid simple carbohydrates  from her diet including Cakes, Sweet Desserts / Pastries, Ice Cream, Soda (diet and regular), Sweet Tea, Candies, Chips, Cookies, Sweet Pastries,  Store Bought Juices, Alcohol in Excess of  1-2 drinks a day, Artificial Sweeteners, Coffee Creamer, and "Sugar-free" Products. This will help patient to have stable blood glucose profile and potentially avoid unintended weight gain.  - I encouraged the patient to switch to  unprocessed or minimally processed complex starch and increased protein intake (animal or plant source), fruits, and vegetables.  - Patient is advised to stick to a routine mealtimes to eat 3 meals  a day and avoid unnecessary snacks ( to snack only to correct hypoglycemia).   - I have approached patient with the following individualized plan to manage diabetes and patient agrees:   -Based on her presenting glycemic profile, she will need multiple daily injections of insulin in order for her to achieve control of diabetes to target.  -Before considering her for basal/bolus insulin, she is advised to increase her Levemir to 50 units twice daily, associated with monitoring of blood glucose twice a day-daily before breakfast and at bedtime.    -Patient is encouraged to call clinic for blood glucose levels less than 70 or above 200 mg /dl. -She is advised to increase glipizide to 10 mg XL p.o. daily at breakfast.   -She is advised to continue metformin  500 mg by  mouth twice a day, therapeutically suitable for patient. -Patient is not a candidate for  SGLT2 inhibitors due to CKD. -If her next visit A1c remains above  9% on next visit, she will be reconsidered for  prandial insulin in addition to her basal insulin.  - Patient specific target  A1c;  LDL, HDL, Triglycerides, were discussed in detail.  2) BP/HTN: Her blood pressure is controlled to target.   She is advised to continue her current blood pressure medications including benazepril 40 mg p.o. daily.   3) Lipids/HPL: Her recent lipid panel showed uncontrolled LDL at 97.  She is advised to continue pravastatin 80 mg p.o. nightly.         4)  Weight/Diet: Her BMI is 30-she is a candidate for moderate weight loss.  CDE Consult has been initiated , exercise, and detailed carbohydrates information provided.  5) Chronic Care/Health Maintenance:  -Patient is on ACEI/ARB and Statin medications and encouraged to continue to follow up with Ophthalmology, Podiatrist at least yearly or according to recommendations, and advised to  stay away from smoking. I have recommended yearly flu vaccine and pneumonia vaccination at least every 5 years; moderate intensity exercise for up to 150 minutes weekly; and  sleep for at least 7 hours a day.   - I advised patient to maintain close follow up with Fayrene Helper, MD for primary care needs.  - Time spent on this patient care encounter:  35 min, of which > 50% was spent in  counseling and the rest reviewing her blood glucose logs , discussing her hypoglycemia and hyperglycemia episodes, reviewing her current and  previous labs / studies  ( including abstraction from other facilities) and medications  doses and developing a  long term treatment plan and documenting her care.   Please refer to Patient Instructions for Blood Glucose Monitoring and Insulin/Medications Dosing Guide"  in media tab for additional information. Please  also refer to " Patient Self  Inventory" in the Media  tab for reviewed elements of pertinent patient history.  Derinda Late Jenson participated in the discussions, expressed understanding, and voiced agreement with the above plans.  All questions were answered to her satisfaction. she is encouraged to contact clinic should she have any questions or concerns prior to her return visit.    Follow up plan: - Return in about 3 months (around 11/08/2019) for Bring Meter and Logs- A1c in Office.  Glade Lloyd, MD Phone: [phone]  Fax: [phone]   This note was partially dictated with voice recognition software. Similar sounding words can be transcribed inadequately or may not  be corrected upon review.  08/08/2019, 8:42 AM

## 2019-08-15 ENCOUNTER — Other Ambulatory Visit: Payer: Self-pay | Admitting: *Deleted

## 2019-08-15 MED ORDER — AMLODIPINE BESYLATE 10 MG PO TABS: ORAL_TABLET | ORAL | 3 refills | Status: DC

## 2019-08-16 LAB — FECAL OCCULT BLOOD, IMMUNOCHEMICAL: IFOBT: POSITIVE

## 2019-08-30 ENCOUNTER — Other Ambulatory Visit: Payer: Self-pay | Admitting: Family Medicine

## 2019-08-30 ENCOUNTER — Other Ambulatory Visit: Payer: Self-pay | Admitting: "Endocrinology

## 2019-09-19 ENCOUNTER — Other Ambulatory Visit: Payer: Self-pay

## 2019-09-19 DIAGNOSIS — Z1211 Encounter for screening for malignant neoplasm of colon: Secondary | ICD-10-CM

## 2019-09-23 ENCOUNTER — Encounter: Payer: Self-pay | Admitting: Gastroenterology

## 2019-09-24 LAB — COLOGUARD: Cologuard: POSITIVE — AB

## 2019-09-24 NOTE — Addendum Note (Signed)
Addended by: Eual Fines on: 09/24/2019 09:22 AM   Modules accepted: Orders

## 2019-10-05 ENCOUNTER — Other Ambulatory Visit: Payer: Self-pay | Admitting: "Endocrinology

## 2019-10-07 ENCOUNTER — Other Ambulatory Visit: Payer: Self-pay | Admitting: "Endocrinology

## 2019-10-10 ENCOUNTER — Encounter: Payer: Self-pay | Admitting: Gastroenterology

## 2019-10-10 ENCOUNTER — Other Ambulatory Visit: Payer: Self-pay

## 2019-10-10 ENCOUNTER — Ambulatory Visit (INDEPENDENT_AMBULATORY_CARE_PROVIDER_SITE_OTHER): Payer: Medicare Other | Admitting: Gastroenterology

## 2019-10-10 DIAGNOSIS — R195 Other fecal abnormalities: Secondary | ICD-10-CM | POA: Diagnosis not present

## 2019-10-10 DIAGNOSIS — K5900 Constipation, unspecified: Secondary | ICD-10-CM | POA: Diagnosis not present

## 2019-10-10 NOTE — Progress Notes (Signed)
Cc'ed to pcp °

## 2019-10-10 NOTE — Patient Instructions (Addendum)
I would like for you to start Linzess once each morning 30 minutes before breakfast. You may have some loose stool starting out, but this should improve over 4-5 days. If this does not, please let me know. Let me know next week how this works, and we will send in a prescription. If not helpful, we will need to do another agent.  We are arranging a colonoscopy in the near future. Due to your baseline constipation, you will do 2 days of clear liquids prior to colonoscopy.  Please do not take any oral diabetes medication the day of the procedure (glipizide and metformin)  You will only take 1/2 dose of insulin on the days you do clear liquids.  We will see you in follow-up thereafter!   It was a pleasure to see you today. I want to create trusting relationships with patients to provide genuine, compassionate, and quality care. I value your feedback. If you receive a survey regarding your visit,  I greatly appreciate you taking time to fill this out.   Annitta Needs, PhD, ANP-BC [facility]

## 2019-10-10 NOTE — Progress Notes (Signed)
Primary Care Physician:  Fayrene Helper, MD  Referring Physician: Dr. Moshe Cipro  Primary Gastroenterologist:  Previously Dr. Oneida Alar    Chief Complaint  Patient presents with  . +fit test  . Constipation    has to take laxative and then may not have bm for 5 days; has got worse with age    HPI:   Joanna Brown is a 79 y.o. female presenting today at the request of Dr. Moshe Cipro due to positive Cologuard. Last colonoscopy by Dr. Oneida Alar in 2015 with three hyperplastic polyps.   History of chronic constipation but feels has been worsening as she ages. Worried about leaky gut. BM every few days, up to a week at a longest. Has to strain. Hard stool when it comes out like Bristol stool scale #1. Had abdominal bloating and discomfort, worsening indigestion previously when very stopped up. Taking dulcolax every 3 days. Doesn't want to take it too much. No rectal bleeding. No weight loss or lack of appetite. GERD better after emptying out. Has taken lower dosages of Linzess in the past (72 mcg) without improvement.   Notes hard to get swallowing started with bread. No true esophageal dysphagia otherwise.   Past Medical History:  Diagnosis Date  . Blood transfusion    1980  . Diabetes mellitus   . Dysrhythmia   . Female bladder prolapse   . GERD 11/18/2008   Qualifier: Diagnosis of  By: Craige Cotta    . GERD (gastroesophageal reflux disease)   . Heart disease   . Hyperlipidemia   . Hypertension    echo and stress 4/10 reports on chart, EKG ` LOV 9/12 on chart  . Mild aortic stenosis     Past Surgical History:  Procedure Laterality Date  . ABDOMINAL HYSTERECTOMY    . ANTERIOR AND POSTERIOR REPAIR  04/26/2011   Procedure: ANTERIOR (CYSTOCELE) AND POSTERIOR REPAIR (RECTOCELE);  Surgeon: Reece Packer, MD;  Location: [facility];  Service: Urology;  Laterality: N/A;  . CATARACT EXTRACTION Bilateral    with IOL  . CHOLECYSTECTOMY  2009  . COLONOSCOPY N/A 12/02/2013   three  colon polyps removed, small internal hemorrhoids. Hyperplastic polyps  . COLONOSCOPY W/ POLYPECTOMY    . LEFT HEART CATH  09/10/2008   normal coronary arteries, normal LV systolic function, EF 50% (Dr. [name])  . LYMPH NODE DISSECTION Right 1997   under arm  . NM MYOCAR PERF WALL MOTION  2010   dipyridamole - mild-mod in intenstiy perfusion defect in mid anterior, mid anteroseptal wall, EF 70%  . OVARY SURGERY     bilateral tumors removed  . THYROIDECTOMY    . THYROIDECTOMY  02/2008  . TRANSTHORACIC ECHOCARDIOGRAM  08/2011   EF=>55%, mild conc LVH; trace MR; mild TR; mild-mod AV calcification with mild valvular AV stenosis  . VAGINAL PROLAPSE REPAIR  04/26/2011   Procedure: VAGINAL VAULT SUSPENSION;  Surgeon: Reece Packer, MD;  Location: [facility];  Service: Urology;  Laterality: N/A;  with Graft  10x6    Current Outpatient Medications  Medication Sig Dispense Refill  . amLODipine (NORVASC) 10 MG tablet TAKE 1 TABLET(10 MG) BY MOUTH EVERY MORNING 90 tablet 3  . aspirin 81 MG tablet Take 81 mg by mouth every morning.     . B-D ULTRAFINE III SHORT PEN 31G X 8 MM MISC USE AS DIRECTED AT BEDTIME WITH INSULIN PENS 100 each 3  . benazepril (LOTENSIN) 40 MG tablet TAKE 1 TABLET(40 MG)  BY MOUTH DAILY 90 tablet 1  . bisacodyl (DULCOLAX) 5 MG EC tablet Take 10 mg by mouth every other day. Takes 2 tablets every other day until has a bowel movement    . Blood Glucose Monitoring Suppl (ACCU-CHEK GUIDE) w/Device KIT 1 each by Does not apply route 4 (four) times daily. 1 kit 0  . Calcium Carbonate-Vit D-Min (CALCIUM 1200) 1200-1000 MG-UNIT CHEW Chew 1 each by mouth daily.    . cholecalciferol (VITAMIN D3) 25 MCG (1000 UT) tablet Take 1,000 Units by mouth daily.    Marland Kitchen ezetimibe (ZETIA) 10 MG tablet Take 1 tablet (10 mg total) by mouth daily. 90 tablet 3  . glipiZIDE (GLUCOTROL XL) 5 MG 24 hr tablet TAKE 2 TABLET BY MOUTH DAILY WITH BREAKFAST 60 tablet 3  . glucose blood (ACCU-CHEK GUIDE) test strip  Use as instructed q.i.d  E11.65 150 each 5  . Lancets Ultra Fine MISC 1 each by Does not apply route 4 (four) times daily. 150 each 5  . latanoprost (XALATAN) 0.005 % ophthalmic solution PLACE 1 GTT INTO BOTH EYE QHS    . LEVEMIR FLEXTOUCH 100 UNIT/ML FlexPen INJECT 50 UNITS INTO THE SKIN 2 (TWO) TIMES DAILY. (Patient taking differently: Inject 80 Units into the skin daily. ) 30 mL 0  . metFORMIN (GLUCOPHAGE) 500 MG tablet TAKE 1 TABLET BY MOUTH EVERY DAY WITH BREAKFAST 60 tablet 2  . Multiple Vitamins-Minerals (CENTRUM) tablet Take 1 tablet by mouth every morning.     . Omega-3 Fatty Acids (FISH OIL) 1200 MG CAPS Take 1 capsule by mouth every morning.     . pravastatin (PRAVACHOL) 80 MG tablet TAKE 1 TABLET(80 MG) BY MOUTH DAILY 90 tablet 1  . Travoprost, BAK Free, (TRAVATAN) 0.004 % SOLN ophthalmic solution Place 1 drop into both eyes at bedtime.     . triamterene-hydrochlorothiazide (MAXZIDE) 75-50 MG tablet TAKE 1 TABLET BY MOUTH DAILY 90 tablet 1  . UNABLE TO FIND Diabetic shoes x 1  Inserts x 3  Dx E11.9 1 each 0   No current facility-administered medications for this visit.    Allergies as of 10/10/2019 - Review Complete 10/10/2019  Allergen Reaction Noted  . Senokot wheat bran [wheat bran]  01/18/2018  . Spironolactone  03/24/2014    Family History  Problem Relation Age of Onset  . Stomach cancer Mother 63  . Heart disease Mother 6       heart disease  . Heart disease Father 39       MI  . Stroke Maternal Grandfather   . Heart attack Paternal Grandfather   . Hypertension Brother   . Bone cancer Brother   . Hypertension Sister   . Liver cancer Sister   . Hypertension Sister   . Hypertension Child   . Colon cancer Neg Hx   . Colon polyps Neg Hx     Social History   Socioeconomic History  . Marital status: Married    Spouse name: [name]  . Number of children: 1  . Years of education: Trade  . Highest education level: 12th grade  Occupational History  .  Occupation: Retired  Tobacco Use  . Smoking status: Never Smoker  . Smokeless tobacco: Never Used  Substance and Sexual Activity  . Alcohol use: No    Comment: socially- none x 30 years  . Drug use: No  . Sexual activity: Yes  Other Topics Concern  . Not on file  Social History Narrative   Patient lives at  home with spouse. 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 LIVED IN [place] FOR 50 YRS AND CAME BACK TO [place] ~2009. Caffeine Use: 1 cup of coffee daily. HAD ONE CHILD: PASSED 5 YRS. HAVE THREE GRAND-KIDS AND TWO GREAT GRANDS.    Social Determinants of Health   Financial Resource Strain: Low Risk   . Difficulty of Paying Living Expenses: Not hard at all  Food Insecurity:   . Worried About Charity fundraiser in the Last Year:   . Arboriculturist in the Last Year:   Transportation Needs: No Transportation Needs  . Lack of Transportation (Medical): No  . Lack of Transportation (Non-Medical): No  Physical Activity: Insufficiently Active  . Days of Exercise per Week: 3 days  . Minutes of Exercise per Session: 20 min  Stress: No Stress Concern Present  . Feeling of Stress : Only a little  Social Connections: Not Isolated  . Frequency of Communication with Friends and Family: Twice a week  . Frequency of Social Gatherings with Friends and Family: Twice a week  . Attends Religious Services: More than 4 times per year  . Active Member of Clubs or Organizations: No  . Attends Archivist Meetings: 1 to 4 times per year  . Marital Status: Married  Human resources officer Violence: Not At Risk  . Fear of Current or Ex-Partner: No  . Emotionally Abused: No  . Physically Abused: No  . Sexually Abused: No    Review of Systems: Gen: Denies any fever, chills, fatigue, weight loss, lack of appetite.  CV: Denies chest pain, heart palpitations, peripheral edema, syncope.  Resp: Denies shortness of breath at rest or with exertion. Denies wheezing or cough.  GI: see  HPI GU : Denies urinary burning, urinary frequency, urinary hesitancy MS: Denies joint pain, muscle weakness, cramps, or limitation of movement.  Derm: Denies rash, itching, dry skin Psych: Denies depression, anxiety, memory loss, and confusion Heme: Denies bruising, bleeding, and enlarged lymph nodes.  Physical Exam: BP 125/74   Pulse 68   Temp (!) 97.1 F (36.2 C) (Temporal)   Ht '5\' 5"'  (1.651 m)   Wt 181 lb (82.1 kg)   BMI 30.12 kg/m  General:   Alert and oriented. Pleasant and cooperative. Well-nourished and well-developed.  Head:  Normocephalic and atraumatic. Eyes:  Without icterus, sclera clear and conjunctiva pink.  Ears:  Normal auditory acuity. Mouth:  Mask in place Lungs:  Clear to auscultation bilaterally. No wheezes, rales, or rhonchi. No distress.  Heart:  S1, S2 present with notable systolic murmur Abdomen:  +BS, soft, non-tender and non-distended. No HSM noted. No guarding or rebound. No masses appreciated.  Rectal:  Deferred  Msk:  Symmetrical without gross deformities. Normal posture. Extremities:  Without edema. Neurologic:  Alert and  oriented x4;  grossly normal neurologically. Skin:  Intact without significant lesions or rashes. Psych:  Alert and cooperative. Normal mood and affect.  ASSESSMENT: Joanna Brown is a 79 y.o. female presenting today with history of chronic constipation that has been slowly worsening over the past few years, now with +Cologuard but without overt GI bleeding. Last colonoscopy in 2015 with hyperplastic polyps; she denies any family history of colorectal cancer or polyps.  Constipation: start Linzess 290 mcg daily. Samples provided. Call with update. If no improvement, trial Amitiza.   +Cologuard: will need diagnostic colonoscopy. We will work on managing bowel regimen in interim. I have also asked for 2 days of clear  liquids prior in preparation.   Difficulty with initiating swallow when eating bread: otherwise without any true  esophageal dysphagia. Will see her in follow-up and if persistent or true esophageal dysphagia will pursue EGD. For now, diagnostic colonoscopy for +cologuard is priority.    PLAN:  Proceed with TCS with Dr. Gala Romney in near future: the risks, benefits, and alternatives have been discussed with the patient in detail. The patient states understanding and desires to proceed.  2 days of clear liquids prior  Start Linzess 290 mcg daily. Call with progress report next week. May need to trial Amitiza  Follow-up thereafter in clinic  Annitta Needs, PhD, ANP-BC [facility]

## 2019-10-16 ENCOUNTER — Ambulatory Visit (HOSPITAL_COMMUNITY)
Admission: RE | Admit: 2019-10-16 | Discharge: 2019-10-16 | Disposition: A | Discharge status: Home / Self Care | Payer: Medicare Other | Source: Ambulatory Visit | Attending: Family Medicine | Admitting: Family Medicine

## 2019-10-16 ENCOUNTER — Other Ambulatory Visit: Payer: Self-pay

## 2019-10-16 ENCOUNTER — Telehealth: Payer: Self-pay | Admitting: Internal Medicine

## 2019-10-16 DIAGNOSIS — Z1231 Encounter for screening mammogram for malignant neoplasm of breast: Secondary | ICD-10-CM | POA: Insufficient documentation

## 2019-10-16 NOTE — Telephone Encounter (Signed)
Pt said [facility] on [address] hasn't received her prep prescription.

## 2019-10-16 NOTE — Telephone Encounter (Signed)
Miralax instructions were mailed. No rx is needed. Called and informed pt.

## 2019-10-23 ENCOUNTER — Ambulatory Visit (INDEPENDENT_AMBULATORY_CARE_PROVIDER_SITE_OTHER): Payer: Medicare Other | Admitting: Podiatry

## 2019-10-23 ENCOUNTER — Encounter: Payer: Self-pay | Admitting: Podiatry

## 2019-10-23 ENCOUNTER — Other Ambulatory Visit: Payer: Self-pay

## 2019-10-23 DIAGNOSIS — M79674 Pain in right toe(s): Secondary | ICD-10-CM | POA: Diagnosis not present

## 2019-10-23 DIAGNOSIS — M79675 Pain in left toe(s): Secondary | ICD-10-CM

## 2019-10-23 DIAGNOSIS — E1142 Type 2 diabetes mellitus with diabetic polyneuropathy: Secondary | ICD-10-CM | POA: Diagnosis not present

## 2019-10-23 DIAGNOSIS — B351 Tinea unguium: Secondary | ICD-10-CM

## 2019-10-23 NOTE — Progress Notes (Signed)
This patient returns to my office for at risk foot care.  This patient requires this care by a professional since this patient will be at risk due to having diabetes and kidney disease.    This patient is unable to cut nails herself since the patient cannot reach her nails.These nails are painful walking and wearing shoes.  This patient presents for at risk foot care today.  General Appearance  Alert, conversant and in no acute stress.  Vascular  Dorsalis pedis and posterior tibial  pulses are palpable  bilaterally.  Capillary return is within normal limits  bilaterally. Temperature is within normal limits  bilaterally.  Neurologic  Senn-Weinstein monofilament wire test within normal limits  bilaterally. Muscle power within normal limits bilaterally.  Nails Thick disfigured discolored nails with subungual debris  from hallux to fifth toes bilaterally. No evidence of bacterial infection or drainage bilaterally.  Orthopedic  No limitations of motion  feet .  No crepitus or effusions noted.  No bony pathology or digital deformities noted.  Mild  HAV  B/L.  Skin  normotropic skin with no porokeratosis noted bilaterally.  No signs of infections or ulcers noted.     Onychomycosis  Pain in right toe  Pain in left toe.  Consent was obtained for treatment procedures.  Debridement and grinding of long thick nails with clearing of subungual debris with nail nipper followed by dremel tool..  No infection or ulcer.     Return office visit   10 weeks        Told patient to return for periodic foot care and evaluation due to potential at risk complications.   Kemiya Batdorf DPM  

## 2019-10-24 ENCOUNTER — Ambulatory Visit (INDEPENDENT_AMBULATORY_CARE_PROVIDER_SITE_OTHER): Payer: Medicare Other | Admitting: Internal Medicine

## 2019-10-24 ENCOUNTER — Other Ambulatory Visit: Payer: Self-pay | Admitting: "Endocrinology

## 2019-10-24 ENCOUNTER — Encounter: Payer: Self-pay | Admitting: "Endocrinology

## 2019-10-24 ENCOUNTER — Encounter: Payer: Self-pay | Admitting: Internal Medicine

## 2019-10-24 VITALS — BP 128/66 | HR 71 | Ht 65.0 in | Wt 181.0 lb

## 2019-10-24 DIAGNOSIS — I350 Nonrheumatic aortic (valve) stenosis: Secondary | ICD-10-CM | POA: Diagnosis not present

## 2019-10-24 DIAGNOSIS — I10 Essential (primary) hypertension: Secondary | ICD-10-CM | POA: Diagnosis not present

## 2019-10-24 DIAGNOSIS — R079 Chest pain, unspecified: Secondary | ICD-10-CM | POA: Diagnosis not present

## 2019-10-24 DIAGNOSIS — Z794 Long term (current) use of insulin: Secondary | ICD-10-CM | POA: Diagnosis not present

## 2019-10-24 DIAGNOSIS — Z961 Presence of intraocular lens: Secondary | ICD-10-CM | POA: Diagnosis not present

## 2019-10-24 DIAGNOSIS — E119 Type 2 diabetes mellitus without complications: Secondary | ICD-10-CM | POA: Diagnosis not present

## 2019-10-24 DIAGNOSIS — H401131 Primary open-angle glaucoma, bilateral, mild stage: Secondary | ICD-10-CM | POA: Diagnosis not present

## 2019-10-24 LAB — HM DIABETES EYE EXAM

## 2019-10-24 NOTE — Patient Instructions (Signed)
Medication Instructions:  Your physician recommends that you continue on your current medications as directed. Please refer to the Current Medication list given to you today.  *If you need a refill on your cardiac medications before your next appointment, please call your pharmacy*   Follow-Up: At [facility], you and your health needs are our priority.  As part of our continuing mission to provide you with exceptional heart care, we have created designated Provider Care Teams.  These Care Teams include your primary Cardiologist (physician) and Advanced Practice Providers (APPs -  Physician Assistants and Nurse Practitioners) who all work together to provide you with the care you need, when you need it.  We recommend signing up for the patient portal called "MyChart".  Sign up information is provided on this After Visit Summary.  MyChart is used to connect with patients for Virtual Visits (Telemedicine).  Patients are able to view lab/test results, encounter notes, upcoming appointments, etc.  Non-urgent messages can be sent to your provider as well.   To learn more about what you can do with MyChart, go to [email or web address].    Your next appointment:   3 month(s)  The format for your next appointment:   In Person  Provider:   You may see Kenneth C Hilty, MD or one of the following Advanced Practice Providers on your designated Care Team:    Hao Meng, PA-C  Angela Duke, PA-C or   Krista Kroeger, PA-C    Other Instructions   

## 2019-10-24 NOTE — Telephone Encounter (Signed)
Please advise on correct dosing for metformin, per your last note she was to take 1 tab BID, it looks like she has been getting 1 tab daily.

## 2019-10-24 NOTE — Progress Notes (Signed)
OFFICE NOTE  Chief Complaint:  Chest pain  Primary Care Physician: Fayrene Helper, MD  HPI:  Joanna Brown is a 79 year old female who has a history of mild aortic stenosis with a valve area of approximately 1.7 cm, also a history of dyslipidemia and hypertension, both of which have been well controlled. We are seeing her back for an annual visit today. She reports actually feeling very well, started walking every day, has made major changes to her diet as reflected by a marked decrease in triglycerides. Unfortunately recently she had a mild elevation in liver enzymes slightly above normal on lovastatin. Typically we could tolerate liver enzymes up to 3 times normal on statin medications, however, she was taken off the lovastatin. Either way it is questionable whether she really needs the additional statin at this time and this certainly argues that she should have further workup as to why she had elevated liver enzymes, whether this is due to a new non-alcoholic steatohepatitis or perhaps concomitant medications causing her elevated liver enzymes.  In fact, I reviewed her CT scan today he years ago and she does have steatohepatitis.  Therefore she is not a good candidate for a statin medication. Her blood pressure seems to be well-controlled on her current regimen.  I saw Joanna Brown back in the office today. She is without any new complaints. She occasionally gets some heartburn and is not currently taking medication for that. She denies any chest pain or worsening shortness of breath.  Joanna Brown returns today for follow-up. Again she is without complaints. We discussed her aortic stenosis however she seemed to have little recollection that she has this disorder. I again went over aortic stenosis and the fact that she has mild to moderate narrowing of the aortic valve. This time we are monitoring it clinically. Her last echo was in April of last year. She is asymptomatic. Her blood  pressure is well controlled. She had recent laboratory work which shows an LDL cholesterol of 70 in October which is good control. Her hemoglobin A1c is 7 which is down from 8.3 prior to that. I've encouraged her to continue with this trend is good cholesterol and blood sugar control her helpful in slowing the process of aortic stenosis.  09/12/2016  Joanna Brown was seen today in follow-up. Overall she seems to be doing well. She has no complaints such as shortness of breath or chest pain. EKG is stable showing normal sinus rhythm at 70 with LAFB. Blood pressure is at goal today. She reports her 11 A1c is in the low 7 range. Cholesterol is also been fairly well controlled. She does have a history of moderate aortic stenosis and is due for repeat echo.  09/12/2017  Joanna Brown was seen today in follow-up.  Over the past year she denies any chest pain or worsening shortness of breath.  Unfortunately her hemoglobin A1c is up from the low sevens to 8.2.  Cholesterol also is higher than goal.  Her total cholesterol is 171, HDL 40, LDL 97 and triglycerides 227.  She reports compliance with pravastatin.  She has moderate aortic stenosis with a mean gradient of 20 mmHg based on echo last year and normal LV function.  She did report one brief episode of chest discomfort with more marked exertion a few days ago.  This was right sided underneath the right breast and was a crampy sensation which improved after resting.  I advised that she monitor that further and if  she has more recurrence we may need to consider stress testing.  12/31/2018  Joanna Brown is seen today for routine follow-up.  She was seen in April 2019.  Since then she reports she was doing fairly well up to about 2 weeks ago.  She has had some worsening shortness of breath, particularly at night and while laying down.  She has a mild nonproductive cough.  She also reports some occasional lower extremity edema.  She gets some shortness of breath with  exertion but denies any chest pain, pressure, heaviness or other anginal symptoms.  EKG today shows incomplete right bundle branch pattern.  Her diabetes not well controlled with A1c recently of 8.8.  Her recent cholesterol profile in February showed total cholesterol 145, triglycerides 225, HDL 41 and LDL 73.  She has known aortic stenosis which is at least moderate however not assessed since 2018 by echo.  04/30/2019  Joanna Brown returns today for follow-up.  She had an echo in August 2020 which showed an EF 50 to 55%, moderate LVH, grade 1 diastolic dysfunction.  There was moderate to severe aortic stenosis with a mean gradient of 21 mmHg, AVA by VTI was 1 cm with a dimensionless index of 0.21.  Symptom wise she does report some shortness of breath with exertion but is not consistent.  She has required some pillows to elevate her head at night.  She denies any chest pain at rest.  She has had no presyncopal or syncopal episodes.  10/24/2019  Joanna Brown is seen today in follow-up.  She reports that she has had some chest pain on and off for the past 2 weeks.  She thought initially it might be something that she ate however she has had several episodes with doing house work and other activities that do improve with rest.  She was supposed to have a repeat echo in December and actually is scheduled for repeat echo next week.  On exam today her aortic valve sounds moderately severe.  These could be symptoms associated with the valve.  EKG was personally reviewed shows no new ischemic changes.  PMHx:  Past Medical History:  Diagnosis Date  . Blood transfusion    1980  . Diabetes mellitus   . Dysrhythmia   . Female bladder prolapse   . GERD 11/18/2008   Qualifier: Diagnosis of  By: Craige Cotta    . GERD (gastroesophageal reflux disease)   . Heart disease   . Hyperlipidemia   . Hypertension    echo and stress 4/10 reports on chart, EKG ` LOV 9/12 on chart  . Mild aortic stenosis     Past Surgical  History:  Procedure Laterality Date  . ABDOMINAL HYSTERECTOMY    . ANTERIOR AND POSTERIOR REPAIR  04/26/2011   Procedure: ANTERIOR (CYSTOCELE) AND POSTERIOR REPAIR (RECTOCELE);  Surgeon: Reece Packer, MD;  Location: [facility];  Service: Urology;  Laterality: N/A;  . CATARACT EXTRACTION Bilateral    with IOL  . CHOLECYSTECTOMY  2009  . COLONOSCOPY N/A 12/02/2013   three colon polyps removed, small internal hemorrhoids. Hyperplastic polyps  . COLONOSCOPY W/ POLYPECTOMY    . LEFT HEART CATH  09/10/2008   normal coronary arteries, normal LV systolic function, EF 23% (Dr. [name])  . LYMPH NODE DISSECTION Right 1997   under arm  . NM MYOCAR PERF WALL MOTION  2010   dipyridamole - mild-mod in intenstiy perfusion defect in mid anterior, mid anteroseptal wall, EF 70%  . OVARY SURGERY  bilateral tumors removed  . THYROIDECTOMY    . THYROIDECTOMY  02/2008  . TRANSTHORACIC ECHOCARDIOGRAM  08/2011   EF=>55%, mild conc LVH; trace MR; mild TR; mild-mod AV calcification with mild valvular AV stenosis  . VAGINAL PROLAPSE REPAIR  04/26/2011   Procedure: VAGINAL VAULT SUSPENSION;  Surgeon: Reece Packer, MD;  Location: [facility];  Service: Urology;  Laterality: N/A;  with Graft  10x6    FAMHx:  Family History  Problem Relation Age of Onset  . Stomach cancer Mother 67  . Heart disease Mother 57       heart disease  . Heart disease Father 71       MI  . Stroke Maternal Grandfather   . Heart attack Paternal Grandfather   . Hypertension Brother   . Bone cancer Brother   . Hypertension Sister   . Liver cancer Sister   . Hypertension Sister   . Hypertension Child   . Colon cancer Neg Hx   . Colon polyps Neg Hx     SOCHx:   reports that she has never smoked. She has never used smokeless tobacco. She reports that she does not drink alcohol and does not use drugs.  ALLERGIES:  Allergies  Allergen Reactions  . Senokot Wheat Bran [Wheat Bran]     ABDOMINAL CRAMPS  . Spironolactone       Stomach problems, vision changes     ROS: Pertinent items noted in HPI and remainder of comprehensive ROS otherwise negative.  HOME MEDS: Current Outpatient Medications  Medication Sig Dispense Refill  . amLODipine (NORVASC) 10 MG tablet TAKE 1 TABLET(10 MG) BY MOUTH EVERY MORNING 90 tablet 3  . aspirin 81 MG tablet Take 81 mg by mouth every morning.     . B-D ULTRAFINE III SHORT PEN 31G X 8 MM MISC USE AS DIRECTED AT BEDTIME WITH INSULIN PENS 100 each 3  . benazepril (LOTENSIN) 40 MG tablet TAKE 1 TABLET(40 MG) BY MOUTH DAILY 90 tablet 1  . bisacodyl (DULCOLAX) 5 MG EC tablet Take 10 mg by mouth every other day. Takes 2 tablets every other day until has a bowel movement    . Blood Glucose Monitoring Suppl (ACCU-CHEK GUIDE) w/Device KIT 1 each by Does not apply route 4 (four) times daily. 1 kit 0  . Calcium Carbonate-Vit D-Min (CALCIUM 1200) 1200-1000 MG-UNIT CHEW Chew 1 each by mouth daily.    . cholecalciferol (VITAMIN D3) 25 MCG (1000 UT) tablet Take 1,000 Units by mouth daily.    Marland Kitchen ezetimibe (ZETIA) 10 MG tablet Take 1 tablet (10 mg total) by mouth daily. 90 tablet 3  . glipiZIDE (GLUCOTROL XL) 5 MG 24 hr tablet TAKE 2 TABLET BY MOUTH DAILY WITH BREAKFAST 60 tablet 3  . glucose blood (ACCU-CHEK GUIDE) test strip Use as instructed q.i.d  E11.65 150 each 5  . Lancets Ultra Fine MISC 1 each by Does not apply route 4 (four) times daily. 150 each 5  . latanoprost (XALATAN) 0.005 % ophthalmic solution PLACE 1 GTT INTO BOTH EYE QHS    . LEVEMIR FLEXTOUCH 100 UNIT/ML FlexPen INJECT 50 UNITS INTO THE SKIN 2 (TWO) TIMES DAILY. (Patient taking differently: Inject 80 Units into the skin daily. ) 30 mL 0  . metFORMIN (GLUCOPHAGE) 500 MG tablet TAKE 1 TABLET BY MOUTH EVERY DAY WITH BREAKFAST 60 tablet 2  . Multiple Vitamins-Minerals (CENTRUM) tablet Take 1 tablet by mouth every morning.     . Omega-3 Fatty Acids (FISH OIL) 1200 MG  CAPS Take 1 capsule by mouth every morning.     . pravastatin  (PRAVACHOL) 80 MG tablet TAKE 1 TABLET(80 MG) BY MOUTH DAILY 90 tablet 1  . Travoprost, BAK Free, (TRAVATAN) 0.004 % SOLN ophthalmic solution Place 1 drop into both eyes at bedtime.     . triamterene-hydrochlorothiazide (MAXZIDE) 75-50 MG tablet TAKE 1 TABLET BY MOUTH DAILY 90 tablet 1  . UNABLE TO FIND Diabetic shoes x 1  Inserts x 3  Dx E11.9 1 each 0   No current facility-administered medications for this visit.    LABS/IMAGING: No results found for this or any previous visit (from the past 48 hour(s)). No results found.  VITALS: BP 128/66   Pulse 71   Ht '5\' 5"'$  (1.651 m)   Wt 181 lb (82.1 kg)   SpO2 98%   BMI 30.12 kg/m   EXAM: General appearance: alert and no distress Neck: no carotid bruit and no JVD Lungs: clear to auscultation bilaterally Heart: regular rate and rhythm, S1, S2 normal and systolic murmur: late systolic 3/6, crescendo at 2nd left intercostal space Abdomen: soft, non-tender; bowel sounds normal; no masses,  no organomegaly Extremities: extremities normal, atraumatic, no cyanosis or edema Pulses: 2+ and symmetric Skin: Skin color, texture, turgor normal. No rashes or lesions Neurologic: Grossly normal Psych: Mood, affect normal  EKG: Normal sinus rhythm at 71, incomplete right bundle branch block, LVH by voltage-personally reviewed  ASSESSMENT: 1. Chest pain 2. Hypertension-controlled 3. Dyslipidemia 4. Moderate to severe aortic stenosis 5. NAFLD 6. GERD 7. DM2 on insulin  PLAN: 1.   Joanna Brown is describing some chest pain which has some on it.  Is may be related to aortic stenosis.  On exam there is a late peaking systolic murmur with a very faint second heart sound.  I like to repeat the echo which is already scheduled in 5 days.  If there is severe or near severe aortic stenosis, will likely refer to the valve clinic.  She will then probably need cath and additional work-up for aortic valve replacement.  Plan follow-up with me in 3  months.  [name], MD, [facility], [place] Director of the [facility] Lipid [facility] &  Cardiovascular Risk Reduction Clinic Diplomate of the American Board of Clinical Lipidology Attending Cardiologist  Direct Dial: [phone]  Fax: [phone]  Website:  [email or web address] Andreas 10/24/2019, 1:31 PM

## 2019-10-29 ENCOUNTER — Other Ambulatory Visit: Payer: Self-pay

## 2019-10-29 ENCOUNTER — Ambulatory Visit (HOSPITAL_COMMUNITY): Payer: Medicare Other | Attending: Cardiovascular Disease

## 2019-10-29 DIAGNOSIS — I350 Nonrheumatic aortic (valve) stenosis: Secondary | ICD-10-CM | POA: Diagnosis not present

## 2019-11-06 ENCOUNTER — Other Ambulatory Visit: Payer: Self-pay

## 2019-11-06 ENCOUNTER — Encounter: Payer: Self-pay | Admitting: Family Medicine

## 2019-11-06 ENCOUNTER — Ambulatory Visit (HOSPITAL_COMMUNITY)
Admission: RE | Admit: 2019-11-06 | Discharge: 2019-11-06 | Disposition: A | Discharge status: Home / Self Care | Payer: Medicare Other | Source: Ambulatory Visit | Attending: Family Medicine | Admitting: Family Medicine

## 2019-11-06 ENCOUNTER — Ambulatory Visit (INDEPENDENT_AMBULATORY_CARE_PROVIDER_SITE_OTHER): Payer: Medicare Other | Admitting: Family Medicine

## 2019-11-06 VITALS — BP 132/73 | HR 67 | Temp 99.0°F | Resp 16 | Ht 65.0 in | Wt 182.0 lb

## 2019-11-06 DIAGNOSIS — E782 Mixed hyperlipidemia: Secondary | ICD-10-CM

## 2019-11-06 DIAGNOSIS — M5442 Lumbago with sciatica, left side: Secondary | ICD-10-CM

## 2019-11-06 DIAGNOSIS — R195 Other fecal abnormalities: Secondary | ICD-10-CM

## 2019-11-06 DIAGNOSIS — E1122 Type 2 diabetes mellitus with diabetic chronic kidney disease: Secondary | ICD-10-CM | POA: Diagnosis not present

## 2019-11-06 DIAGNOSIS — I10 Essential (primary) hypertension: Secondary | ICD-10-CM | POA: Diagnosis not present

## 2019-11-06 DIAGNOSIS — Z794 Long term (current) use of insulin: Secondary | ICD-10-CM

## 2019-11-06 DIAGNOSIS — E663 Overweight: Secondary | ICD-10-CM

## 2019-11-06 DIAGNOSIS — I350 Nonrheumatic aortic (valve) stenosis: Secondary | ICD-10-CM

## 2019-11-06 DIAGNOSIS — G8929 Other chronic pain: Secondary | ICD-10-CM | POA: Diagnosis present

## 2019-11-06 DIAGNOSIS — N181 Chronic kidney disease, stage 1: Secondary | ICD-10-CM

## 2019-11-06 NOTE — Assessment & Plan Note (Addendum)
Worsening in past 1 year, esp when she stands up from sitting position, may have mild left leg weakness Will get X ray and refer to therapy

## 2019-11-06 NOTE — Patient Instructions (Addendum)
F/U in November with MD , call if you need me before  Please  Get X ray of low back and you are  referred to physical therapy  Fasting CBC, lipid, cmp and EGFR 1 week before nov appt   Blood pressure and cholesterol are great  All the best with colonscopy and cardiology folllow up  It is important that you exercise regularly at least 30 minutes 5 times a week. If you develop chest pain, have severe difficulty breathing, or feel very tired, stop exercising immediately and seek medical attention    Think about what you will eat, plan ahead. Choose " clean, green, fresh or frozen" over canned, processed or packaged foods which are more sugary, salty and fatty. 70 to 75% of food eaten should be vegetables and fruit. Three meals at set times with snacks allowed between meals, but they must be fruit or vegetables. Aim to eat over a 12 hour period , example 7 am to 7 pm, and STOP after  your last meal of the day. Drink water,generally about 64 ounces per day, no other drink is as healthy. Fruit juice is best enjoyed in a healthy way, by EATING the fruit.   Thanks for choosing [facility], we consider it a privelige to serve you.

## 2019-11-06 NOTE — Progress Notes (Signed)
Joanna Brown     MRN: [id number]      DOB: Apr 11, 1941   HPI Joanna Brown is here for follow up and re-evaluation of chronic medical conditions, medication management and review of any available recent lab and radiology data.  Preventive health is updated, specifically  Cancer screening and Immunization.   Questions or concerns regarding consultations or procedures which the PT has had in the interim are  addressed. The PT denies any adverse reactions to current medications since the last visit.  C/o heart disease she is concerned about valve disease , has  upcoming appt to review abn with cardiology  ROS Denies recent fever or chills. Denies sinus pressure, nasal congestion, ear pain or sore throat. Denies chest congestion, productive cough or wheezing. Denies chest pains, palpitations and leg swelling Denies abdominal pain, nausea, vomiting,diarrhea or constipation.   Denies dysuria, frequency, hesitancy or incontinence. C/o worsening low back pain, limits her ability to stand as well as her ability to walk for any period of time, this is progressively worsening Denies headaches, seizures, numbness, or tingling. Denies depression, anxiety or insomnia. Denies skin break down or rash.   PE  BP 132/73   Pulse 67   Temp 99 F (37.2 C) (Temporal)   Resp 16   Ht 5\' 5"  (1.651 m)   Wt 182 lb (82.6 kg)   SpO2 98%   BMI 30.29 kg/m   Patient alert and oriented and in no cardiopulmonary distress.  HEENT: No facial asymmetry, EOMI,     Neck supple .  Chest: Clear to auscultation bilaterally.  CVS: S1, S2 no murmurs, no S3.Regular rate.  ABD: Soft non tender.   Ext: No edema  MS: Adequate though reduced  ROM spine,normal in shoulders, hips and knees.  Skin: Intact, no ulcerations or rash noted.  Psych: Good eye contact, normal affect. Memory intact not anxious or depressed appearing.  CNS: CN 2-12 intact, power,  normal throughout.no focal deficits noted.   Assessment  & Plan  Low back pain with left-sided sciatica Worsening in past 1 year, esp when she stands up from sitting position, may have mild left leg weakness Will get X ray and refer to therapy  Aortic valve stenosis Has upcoming appt with Cardiolgy to discuss recent Echo, there is progression of disease and pt has fatigue  Essential hypertension Controlled, no change in medication DASH diet and commitment to daily physical activity for a minimum of 30 minutes discussed and encouraged, as a part of hypertension management. The importance of attaining a healthy weight is also discussed.  BP/Weight 11/08/2019 11/06/2019 10/24/2019 10/10/2019 08/08/2019 07/29/2019 5/32/9924  Systolic BP 268 341 962 229 798 921 194  Diastolic BP 62 73 66 74 72 78 78  Wt. (Lbs) 179.4 182 181 181 182.6 182 182  BMI 29.85 30.29 30.12 30.12 30.39 30.29 30.29       Mixed hyperlipidemia Hyperlipidemia:Low fat diet discussed and encouraged.   Lipid Panel  Lab Results  Component Value Date   CHOL 161 07/30/2019   HDL 34 (L) 07/30/2019   LDLCALC 99 07/30/2019   TRIG 179 (H) 07/30/2019   CHOLHDL 4.7 07/30/2019  needs to lower fat intake Updated lab needed at/ before next visit.        Type 2 diabetes mellitus with stage 1 chronic kidney disease, with long-term current use of insulin ([place]) Joanna Brown is reminded of the importance of commitment to daily physical activity for 30 minutes or more, as able and  the need to limit carbohydrate intake to 30 to 60 grams per meal to help with blood sugar control.   The need to take medication as prescribed, test blood sugar as directed, and to call between visits if there is a concern that blood sugar is uncontrolled is also discussed.   Joanna Brown is reminded of the importance of daily foot exam, annual eye examination, and good blood sugar, blood pressure and cholesterol control.  Gradually improving, managed by Endo, still not at goal Diabetic Labs Latest Ref Rng  & Units 11/08/2019 08/08/2019 07/30/2019 05/08/2019 01/01/2019  HbA1c 4.0 - 5.6 % 8.3(A) 9.0(A) - 8.3(A) 8.9(H)  Microalbumin mg/dL - - 6.4 - -  Micro/Creat Ratio <30 mcg/mg creat - - 74(H) - -  Chol <200 mg/dL - - 161 - 175  HDL > OR = 50 mg/dL - - 34(L) - 37(L)  Calc LDL mg/dL (calc) - - 99 - 107(H)  Triglycerides <150 mg/dL - - 179(H) - 184(H)  Creatinine 0.60 - 0.93 mg/dL - - 0.96(H) - 0.96(H)   BP/Weight 11/08/2019 11/06/2019 10/24/2019 10/10/2019 08/08/2019 07/29/2019 5/36/1443  Systolic BP 154 008 676 195 093 267 124  Diastolic BP 62 73 66 74 72 78 78  Wt. (Lbs) 179.4 182 181 181 182.6 182 182  BMI 29.85 30.29 30.12 30.12 30.39 30.29 30.29   Foot/eye exam completion dates Latest Ref Rng & Units 10/24/2019 09/25/2018  Eye Exam No Retinopathy No Retinopathy No Retinopathy  Foot Form Completion - - -        Positive colorectal cancer screening using Cologuard test Has scheduled colonoscopy upcoming to further evaluate  Overweight (BMI 25.0-29.9)  Patient re-educated about  the importance of commitment to a  minimum of 150 minutes of exercise per week as able.  The importance of healthy food choices with portion control discussed, as well as eating regularly and within a 12 hour window most days. The need to choose "clean , green" food 50 to 75% of the time is discussed, as well as to make water the primary drink and set a goal of 64 ounces water daily.    Weight /BMI 11/08/2019 11/06/2019 10/24/2019  WEIGHT 179 lb 6.4 oz 182 lb 181 lb  HEIGHT 5\' 5"  5\' 5"  5\' 5"   BMI 29.85 kg/m2 30.29 kg/m2 30.12 kg/m2

## 2019-11-08 ENCOUNTER — Ambulatory Visit (INDEPENDENT_AMBULATORY_CARE_PROVIDER_SITE_OTHER): Payer: Medicare Other | Admitting: "Endocrinology

## 2019-11-08 ENCOUNTER — Other Ambulatory Visit: Payer: Self-pay

## 2019-11-08 ENCOUNTER — Encounter: Payer: Self-pay | Admitting: "Endocrinology

## 2019-11-08 VITALS — BP 105/62 | HR 69 | Ht 65.0 in | Wt 179.4 lb

## 2019-11-08 DIAGNOSIS — E1122 Type 2 diabetes mellitus with diabetic chronic kidney disease: Secondary | ICD-10-CM

## 2019-11-08 DIAGNOSIS — Z794 Long term (current) use of insulin: Secondary | ICD-10-CM

## 2019-11-08 DIAGNOSIS — E782 Mixed hyperlipidemia: Secondary | ICD-10-CM

## 2019-11-08 DIAGNOSIS — I10 Essential (primary) hypertension: Secondary | ICD-10-CM

## 2019-11-08 DIAGNOSIS — N181 Chronic kidney disease, stage 1: Secondary | ICD-10-CM | POA: Diagnosis not present

## 2019-11-08 LAB — POCT GLYCOSYLATED HEMOGLOBIN (HGB A1C): Hemoglobin A1C: 8.3 % — AB (ref 4.0–5.6)

## 2019-11-08 MED ORDER — LEVEMIR FLEXTOUCH 100 UNIT/ML ~~LOC~~ SOPN: 80.0000 [IU] | PEN_INJECTOR | Freq: Every day | SUBCUTANEOUS | 2 refills | Status: DC

## 2019-11-08 NOTE — Patient Instructions (Signed)

## 2019-11-08 NOTE — Progress Notes (Signed)
11/08/2019                          Endocrinology follow-up note   Subjective:    Patient ID: Joanna Brown, female    DOB: 1941-01-13. She Patient is being engaged in telehealth via telephone in follow-up for the management of currently uncontrolled type 2 diabetes, and associated hyperlipidemia and hypertension. PMD:   Fayrene Helper, MD  Past Medical History:  Diagnosis Date  . Blood transfusion    1980  . Diabetes mellitus   . Dysrhythmia   . Female bladder prolapse   . GERD 11/18/2008   Qualifier: Diagnosis of  By: Craige Cotta    . GERD (gastroesophageal reflux disease)   . Heart disease   . Hyperlipidemia   . Hypertension    echo and stress 4/10 reports on chart, EKG ` LOV 9/12 on chart  . Mild aortic stenosis    Past Surgical History:  Procedure Laterality Date  . ABDOMINAL HYSTERECTOMY    . ANTERIOR AND POSTERIOR REPAIR  04/26/2011   Procedure: ANTERIOR (CYSTOCELE) AND POSTERIOR REPAIR (RECTOCELE);  Surgeon: Reece Packer, MD;  Location: [facility];  Service: Urology;  Laterality: N/A;  . CATARACT EXTRACTION Bilateral    with IOL  . CHOLECYSTECTOMY  2009  . COLONOSCOPY N/A 12/02/2013   three colon polyps removed, small internal hemorrhoids. Hyperplastic polyps  . COLONOSCOPY W/ POLYPECTOMY    . LEFT HEART CATH  09/10/2008   normal coronary arteries, normal LV systolic function, EF 79% (Dr. [name])  . LYMPH NODE DISSECTION Right 1997   under arm  . NM MYOCAR PERF WALL MOTION  2010   dipyridamole - mild-mod in intenstiy perfusion defect in mid anterior, mid anteroseptal wall, EF 70%  . OVARY SURGERY     bilateral tumors removed  . THYROIDECTOMY    . THYROIDECTOMY  02/2008  . TRANSTHORACIC ECHOCARDIOGRAM  08/2011   EF=>55%, mild conc LVH; trace MR; mild TR; mild-mod AV calcification with mild valvular AV stenosis  . VAGINAL PROLAPSE REPAIR  04/26/2011   Procedure: VAGINAL VAULT SUSPENSION;  Surgeon: Reece Packer, MD;  Location: [facility];  Service:  Urology;  Laterality: N/A;  with Graft  10x6   Social History   Socioeconomic History  . Marital status: Married    Spouse name: [name]  . Number of children: 1  . Years of education: Trade  . Highest education level: 12th grade  Occupational History  . Occupation: Retired  Tobacco Use  . Smoking status: Never Smoker  . Smokeless tobacco: Never Used  Vaping Use  . Vaping Use: Never used  Substance and Sexual Activity  . Alcohol use: No    Comment: socially- none x 30 years  . Drug use: No  . Sexual activity: Yes  Other Topics Concern  . Not on file  Social History Narrative   Patient lives at home with spouse. 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 LIVED IN [place] FOR [place] YRS AND CAME BACK TO [place] ~2009. Caffeine Use: 1 cup of coffee daily. HAD ONE CHILD: PASSED 5 YRS. HAVE THREE GRAND-KIDS AND TWO GREAT GRANDS.    Social Determinants of Health   Financial Resource Strain: Low Risk   . Difficulty of Paying Living Expenses: Not hard at all  Food Insecurity:   . Worried About Charity fundraiser in the Last Year:   . [place] in the  Last Year:   Transportation Needs: No Transportation Needs  . Lack of Transportation (Medical): No  . Lack of Transportation (Non-Medical): No  Physical Activity: Insufficiently Active  . Days of Exercise per Week: 3 days  . Minutes of Exercise per Session: 20 min  Stress: No Stress Concern Present  . Feeling of Stress : Only a little  Social Connections: Socially Integrated  . Frequency of Communication with Friends and Family: Twice a week  . Frequency of Social Gatherings with Friends and Family: Twice a week  . Attends Religious Services: More than 4 times per year  . Active Member of Clubs or Organizations: No  . Attends Archivist Meetings: 1 to 4 times per year  . Marital Status: Married   Outpatient Encounter Medications as of 11/08/2019  Medication Sig  . ALPHAGAN P 0.1 % SOLN Apply 1 drop  to eye every morning.  Marland Kitchen amLODipine (NORVASC) 10 MG tablet TAKE 1 TABLET(10 MG) BY MOUTH EVERY MORNING (Patient taking differently: Take 10 mg by mouth daily. TAKE 1 TABLET(10 MG) BY MOUTH EVERY MORNING)  . Ascorbic Acid (VITAMIN C) 1000 MG tablet Take 1,000 mg by mouth 4 (four) times a week.  Marland Kitchen aspirin 81 MG tablet Take 81 mg by mouth every other day.   . B-D ULTRAFINE III SHORT PEN 31G X 8 MM MISC USE AS DIRECTED AT BEDTIME WITH INSULIN PENS  . benazepril (LOTENSIN) 40 MG tablet TAKE 1 TABLET(40 MG) BY MOUTH DAILY (Patient taking differently: Take 40 mg by mouth daily. )  . Blood Glucose Monitoring Suppl (ACCU-CHEK GUIDE) w/Device KIT 1 each by Does not apply route 4 (four) times daily.  . Calcium Carbonate-Vit D-Min (CALCIUM 1200) 1200-1000 MG-UNIT CHEW Chew 1 each by mouth 4 (four) times a week.   . cholecalciferol (VITAMIN D3) 25 MCG (1000 UT) tablet Take 1,000 Units by mouth daily.  Marland Kitchen ezetimibe (ZETIA) 10 MG tablet Take 1 tablet (10 mg total) by mouth daily.  Marland Kitchen glipiZIDE (GLUCOTROL XL) 5 MG 24 hr tablet TAKE 2 TABLET BY MOUTH DAILY WITH BREAKFAST (Patient taking differently: Take 10 mg by mouth daily with breakfast. TAKE 2 TABLET BY MOUTH DAILY WITH BREAKFAST)  . glucose blood (ACCU-CHEK GUIDE) test strip Use as instructed q.i.d  E11.65  . insulin detemir (LEVEMIR FLEXTOUCH) 100 UNIT/ML FlexPen Inject 80 Units into the skin at bedtime.  . Lancets Ultra Fine MISC 1 each by Does not apply route 4 (four) times daily.  Marland Kitchen latanoprost (XALATAN) 0.005 % ophthalmic solution Place 1 drop into both eyes at bedtime.   . metFORMIN (GLUCOPHAGE) 500 MG tablet TAKE 1 TABLET BY MOUTH EVERY DAY WITH BREAKFAST (Patient taking differently: Take 500 mg by mouth daily with breakfast. TAKE 1 TABLET BY MOUTH EVERY DAY WITH BREAKFAST)  . Multiple Vitamins-Minerals (CENTRUM) tablet Take 1 tablet by mouth every morning.   . Omega-3 Fatty Acids (FISH OIL) 1200 MG CAPS Take 1 capsule by mouth every morning.   .  pravastatin (PRAVACHOL) 80 MG tablet TAKE 1 TABLET(80 MG) BY MOUTH DAILY (Patient taking differently: Take 80 mg by mouth daily. TAKE 1 TABLET(80 MG) BY MOUTH DAILY)  . triamterene-hydrochlorothiazide (MAXZIDE) 75-50 MG tablet TAKE 1 TABLET BY MOUTH DAILY  . [DISCONTINUED] LEVEMIR FLEXTOUCH 100 UNIT/ML FlexPen INJECT 50 UNITS INTO THE SKIN 2 (TWO) TIMES DAILY. (Patient taking differently: Inject 80 Units into the skin at bedtime. )  . [DISCONTINUED] UNABLE TO FIND Diabetic shoes x 1  Inserts x 3  Dx E11.9  No facility-administered encounter medications on file as of 11/08/2019.   ALLERGIES: Allergies  Allergen Reactions  . Senokot Wheat Bran [Wheat Bran]     ABDOMINAL CRAMPS  . Spironolactone     Stomach problems, vision changes    VACCINATION STATUS: Immunization History  Administered Date(s) Administered  . Pneumococcal Conjugate-13 12/11/2013  . Pneumococcal Polysaccharide-23 01/13/2010  . Tdap 10/05/2010    Diabetes She presents for her follow-up diabetic visit. She has type 2 diabetes mellitus. Onset time: She was diagnosed at approximate age of 78 years. Her disease course has been improving. There are no hypoglycemic associated symptoms. Pertinent negatives for hypoglycemia include no confusion, headaches, pallor or seizures. Pertinent negatives for diabetes include no blurred vision, no chest pain, no fatigue, no polydipsia, no polyphagia and no polyuria. There are no hypoglycemic complications. Symptoms are improving. Diabetic complications include nephropathy and retinopathy. Risk factors for coronary artery disease include dyslipidemia, diabetes mellitus, obesity and sedentary lifestyle. Her weight is fluctuating minimally. She is following a generally unhealthy diet. When asked about meal planning, she reported none. She has had a previous visit with a dietitian. She rarely participates in exercise. Her home blood glucose trend is increasing steadily. Her breakfast blood glucose  range is generally 130-140 mg/dl. Her overall blood glucose range is 130-140 mg/dl. (She did not bring her logs nor meter to review.  She reports fasting blood glucose ranges between 80s-150.  Point of care A1c8.2% improving.) Eye exam is current.  Hyperlipidemia This is a chronic problem. The current episode started more than 1 year ago. The problem is uncontrolled. Exacerbating diseases include diabetes and obesity. Pertinent negatives include no chest pain, myalgias or shortness of breath. Current antihyperlipidemic treatment includes statins. Risk factors for coronary artery disease include dyslipidemia, diabetes mellitus, hypertension, obesity, a sedentary lifestyle and post-menopausal.  Hypertension This is a chronic problem. The current episode started more than 1 year ago. Pertinent negatives include no blurred vision, chest pain, headaches, palpitations or shortness of breath. Risk factors for coronary artery disease include dyslipidemia, diabetes mellitus, obesity and sedentary lifestyle. Hypertensive end-organ damage includes retinopathy.     Review of systems  Constitutional: + Minimally fluctuating body weight,  current  Body mass index is 29.85 kg/m. , no fatigue, no subjective hyperthermia, no subjective hypothermia Eyes: no blurry vision, no xerophthalmia ENT: no sore throat, no nodules palpated in throat, no dysphagia/odynophagia, no hoarseness Cardiovascular: no Chest Pain, no Shortness of Breath, no palpitations, no leg swelling Respiratory: no cough, no shortness of breath Gastrointestinal: no Nausea/Vomiting/Diarhhea Musculoskeletal: no muscle/joint aches Skin: no rashes, no hyperemia Neurological: no tremors, no numbness, no tingling, no dizziness Psychiatric: no depression, no anxiety   Objective:    BP 105/62   Pulse 69   Ht '5\' 5"'  (1.651 m)   Wt 179 lb 6.4 oz (81.4 kg)   BMI 29.85 kg/m   Wt Readings from Last 3 Encounters:  11/08/19 179 lb 6.4 oz (81.4 kg)   11/06/19 182 lb (82.6 kg)  10/24/19 181 lb (82.1 kg)    Physical Exam- Limited  Constitutional:  Body mass index is 29.85 kg/m. , not in acute distress, normal state of mind Eyes:  EOMI, no exophthalmos Neck: Supple Thyroid: No gross goiter Respiratory: Adequate breathing efforts Musculoskeletal: no gross deformities, strength intact in all four extremities, no gross restriction of joint movements Skin:  no rashes, no hyperemia Neurological: no tremor with outstretched hands,    CMP ( most recent) CMP     Component  Value Date/Time   NA 140 07/30/2019 0718   NA 135 12/31/2018 0915   K 4.2 07/30/2019 0718   CL 101 07/30/2019 0718   CO2 31 07/30/2019 0718   GLUCOSE 191 (H) 07/30/2019 0718   BUN 18 07/30/2019 0718   BUN 13 12/31/2018 0915   CREATININE 0.96 (H) 07/30/2019 0718   CALCIUM 9.8 07/30/2019 0718   PROT 6.8 07/30/2019 0718   PROT 7.4 12/31/2018 0915   ALBUMIN 4.6 12/31/2018 0915   AST 25 07/30/2019 0718   ALT 25 07/30/2019 0718   ALKPHOS 57 12/31/2018 0915   BILITOT 0.3 07/30/2019 0718   BILITOT 0.2 12/31/2018 0915   GFRNONAA 57 (L) 07/30/2019 0718   GFRAA 66 07/30/2019 0718    Diabetic Labs (most recent): Lab Results  Component Value Date   HGBA1C 8.3 (A) 11/08/2019   HGBA1C 9.0 (A) 08/08/2019   HGBA1C 8.3 (A) 05/08/2019    Lipid Panel     Component Value Date/Time   CHOL 161 07/30/2019 0718   TRIG 179 (H) 07/30/2019 0718   HDL 34 (L) 07/30/2019 0718   CHOLHDL 4.7 07/30/2019 0718   VLDL 39 12/05/2017 0832   LDLCALC 99 07/30/2019 0718     Assessment & Plan:   1. Type 2 diabetes mellitus with stage 1 chronic kidney disease, with long-term current use of insulin ([place])  - Patient has currently uncontrolled symptomatic type 2 DM since  79 years of age. -She did not bring her logs nor meter to review.  She reports fasting blood glucose ranges between 80s-150.  Point of care A1c8.2% improving  9%. She does not report any hypoglycemia.  Recent labs  reviewed, showing improving renal function.     Her diabetes is complicated by CKD obesity/sedentary life and patient remains at a high risk for more acute and chronic complications of diabetes which include CAD, CVA, CKD, retinopathy, and neuropathy. These are all discussed in detail with the patient.  - I have counseled the patient on diet management and weight loss, by adopting a carbohydrate restricted/protein rich diet.  -She still admits to dietary indiscretions including consumption of sweets and sweetened beverages.  - she  admits there is a room for improvement in her diet and drink choices. -  Suggestion is made for her to avoid simple carbohydrates  from her diet including Cakes, Sweet Desserts / Pastries, Ice Cream, Soda (diet and regular), Sweet Tea, Candies, Chips, Cookies, Sweet Pastries,  Store Bought Juices, Alcohol in Excess of  1-2 drinks a day, Artificial Sweeteners, Coffee Creamer, and "Sugar-free" Products. This will help patient to have stable blood glucose profile and potentially avoid unintended weight gain.   - I encouraged the patient to switch to  unprocessed or minimally processed complex starch and increased protein intake (animal or plant source), fruits, and vegetables.  - Patient is advised to stick to a routine mealtimes to eat 3 meals  a day and avoid unnecessary snacks ( to snack only to correct hypoglycemia).   - I have approached patient with the following individualized plan to manage diabetes and patient agrees:   -Based on her presenting glycemic profile, she will not need prandial insulin for now. -She is advised to continue Levemir 80 units nightly,   associated with monitoring of blood glucose twice a day-daily before breakfast and at bedtime.    -Patient is encouraged to call clinic for blood glucose levels less than 70 or above 200 mg /dl. -She is advised to to continue glipizide  10 mg XL p.o. daily at breakfast.   -She is advised to continue  metformin  500 mg by mouth twice a day, therapeutically suitable for patient. -Patient is not a candidate for  SGLT2 inhibitors due to CKD. -If her next visit A1c remains above  9% on next visit, she will be reconsidered for  prandial insulin in addition to her basal insulin.  - Patient specific target  A1c;  LDL, HDL, Triglycerides, were discussed in detail.  2) BP/HTN:  Her blood pressure is controlled to target.   She is advised to continue her current blood pressure medications including benazepril 40 mg p.o. daily.   3) Lipids/HPL: Her recent lipid panel showed uncontrolled LDL at 97.  She is advised to continue pravastatin 80 mg p.o. nightly.         4)  Weight/Diet: Her BMI is 29.8 -she is a candidate for moderate weight loss.  CDE Consult has been initiated , exercise, and detailed carbohydrates information provided.  5) Chronic Care/Health Maintenance:  -Patient is on ACEI/ARB and Statin medications and encouraged to continue to follow up with Ophthalmology, Podiatrist at least yearly or according to recommendations, and advised to  stay away from smoking. I have recommended yearly flu vaccine and pneumonia vaccination at least every 5 years; moderate intensity exercise for up to 150 minutes weekly; and  sleep for at least 7 hours a day.   - I advised patient to maintain close follow up with Fayrene Helper, MD for primary care needs.   - Time spent on this patient care encounter:  35 min, of which > 50% was spent in  counseling and the rest reviewing her blood glucose logs , discussing her hypoglycemia and hyperglycemia episodes, reviewing her current and  previous labs / studies  ( including abstraction from other facilities) and medications  doses and developing a  long term treatment plan and documenting her care.   Please refer to Patient Instructions for Blood Glucose Monitoring and Insulin/Medications Dosing Guide"  in media tab for additional information. Please  also  refer to " Patient Self Inventory" in the Media  tab for reviewed elements of pertinent patient history.  Derinda Late Gentle participated in the discussions, expressed understanding, and voiced agreement with the above plans.  All questions were answered to her satisfaction. she is encouraged to contact clinic should she have any questions or concerns prior to her return visit.    Follow up plan: - Return in about 4 months (around 03/09/2020) for F/U with Pre-visit Labs, Meter, Logs, A1c here.Glade Lloyd, MD Phone: [phone]  Fax: [phone]   This note was partially dictated with voice recognition software. Similar sounding words can be transcribed inadequately or may not  be corrected upon review.  11/08/2019, 11:03 AM

## 2019-11-10 ENCOUNTER — Encounter: Payer: Self-pay | Admitting: Family Medicine

## 2019-11-10 NOTE — Assessment & Plan Note (Signed)
Joanna Brown is reminded of the importance of commitment to daily physical activity for 30 minutes or more, as able and the need to limit carbohydrate intake to 30 to 60 grams per meal to help with blood sugar control.   The need to take medication as prescribed, test blood sugar as directed, and to call between visits if there is a concern that blood sugar is uncontrolled is also discussed.   Joanna Brown is reminded of the importance of daily foot exam, annual eye examination, and good blood sugar, blood pressure and cholesterol control.  Gradually improving, managed by Endo, still not at goal Diabetic Labs Latest Ref Rng & Units 11/08/2019 08/08/2019 07/30/2019 05/08/2019 01/01/2019  HbA1c 4.0 - 5.6 % 8.3(A) 9.0(A) - 8.3(A) 8.9(H)  Microalbumin mg/dL - - 6.4 - -  Micro/Creat Ratio <30 mcg/mg creat - - 74(H) - -  Chol <200 mg/dL - - 161 - 175  HDL > OR = 50 mg/dL - - 34(L) - 37(L)  Calc LDL mg/dL (calc) - - 99 - 107(H)  Triglycerides <150 mg/dL - - 179(H) - 184(H)  Creatinine 0.60 - 0.93 mg/dL - - 0.96(H) - 0.96(H)   BP/Weight 11/08/2019 11/06/2019 10/24/2019 10/10/2019 08/08/2019 07/29/2019 3/73/6681  Systolic BP 594 707 615 183 437 357 897  Diastolic BP 62 73 66 74 72 78 78  Wt. (Lbs) 179.4 182 181 181 182.6 182 182  BMI 29.85 30.29 30.12 30.12 30.39 30.29 30.29   Foot/eye exam completion dates Latest Ref Rng & Units 10/24/2019 09/25/2018  Eye Exam No Retinopathy No Retinopathy No Retinopathy  Foot Form Completion - - -

## 2019-11-10 NOTE — Assessment & Plan Note (Signed)
Hyperlipidemia:Low fat diet discussed and encouraged.   Lipid Panel  Lab Results  Component Value Date   CHOL 161 07/30/2019   HDL 34 (L) 07/30/2019   LDLCALC 99 07/30/2019   TRIG 179 (H) 07/30/2019   CHOLHDL 4.7 07/30/2019  needs to lower fat intake Updated lab needed at/ before next visit.

## 2019-11-10 NOTE — Assessment & Plan Note (Signed)
Controlled, no change in medication DASH diet and commitment to daily physical activity for a minimum of 30 minutes discussed and encouraged, as a part of hypertension management. The importance of attaining a healthy weight is also discussed.  BP/Weight 11/08/2019 11/06/2019 10/24/2019 10/10/2019 08/08/2019 07/29/2019 4/49/2010  Systolic BP 071 219 758 [phone] 415  Diastolic BP 62 73 66 74 72 78 78  Wt. (Lbs) 179.4 182 181 181 182.6 182 182  BMI 29.85 30.29 30.12 30.12 30.39 30.29 30.29

## 2019-11-10 NOTE — Assessment & Plan Note (Signed)
Has upcoming appt with Cardiolgy to discuss recent Echo, there is progression of disease and pt has fatigue

## 2019-11-10 NOTE — Assessment & Plan Note (Signed)
Has scheduled colonoscopy upcoming to further evaluate

## 2019-11-10 NOTE — Assessment & Plan Note (Signed)
  Patient re-educated about  the importance of commitment to a  minimum of 150 minutes of exercise per week as able.  The importance of healthy food choices with portion control discussed, as well as eating regularly and within a 12 hour window most days. The need to choose "clean , green" food 50 to 75% of the time is discussed, as well as to make water the primary drink and set a goal of 64 ounces water daily.    Weight /BMI 11/08/2019 11/06/2019 10/24/2019  WEIGHT 179 lb 6.4 oz 182 lb 181 lb  HEIGHT 5\' 5"  5\' 5"  5\' 5"   BMI 29.85 kg/m2 30.29 kg/m2 30.12 kg/m2

## 2019-11-11 ENCOUNTER — Ambulatory Visit: Payer: Medicare Other | Admitting: "Endocrinology

## 2019-11-14 ENCOUNTER — Other Ambulatory Visit: Payer: Self-pay | Admitting: Family Medicine

## 2019-11-19 ENCOUNTER — Other Ambulatory Visit: Payer: Self-pay

## 2019-11-19 ENCOUNTER — Other Ambulatory Visit (HOSPITAL_COMMUNITY)
Admission: RE | Admit: 2019-11-19 | Discharge: 2019-11-19 | Disposition: A | Discharge status: Home / Self Care | Payer: Medicare Other | Source: Ambulatory Visit | Attending: Internal Medicine | Admitting: Internal Medicine

## 2019-11-19 DIAGNOSIS — Z01812 Encounter for preprocedural laboratory examination: Secondary | ICD-10-CM | POA: Diagnosis not present

## 2019-11-19 DIAGNOSIS — Z20822 Contact with and (suspected) exposure to covid-19: Secondary | ICD-10-CM | POA: Insufficient documentation

## 2019-11-19 LAB — SARS CORONAVIRUS 2 (TAT 6-24 HRS): SARS Coronavirus 2: NEGATIVE

## 2019-11-20 ENCOUNTER — Other Ambulatory Visit: Payer: Self-pay

## 2019-11-20 ENCOUNTER — Ambulatory Visit (HOSPITAL_COMMUNITY)
Admission: RE | Admit: 2019-11-20 | Discharge: 2019-11-20 | Disposition: A | Discharge status: Home / Self Care | Payer: Medicare Other | Attending: Internal Medicine | Admitting: Internal Medicine

## 2019-11-20 ENCOUNTER — Encounter (HOSPITAL_COMMUNITY): Payer: Self-pay | Admitting: Internal Medicine

## 2019-11-20 ENCOUNTER — Encounter (HOSPITAL_COMMUNITY)
Admission: RE | Disposition: A | Discharge status: Home / Self Care | Payer: Self-pay | Source: Home / Self Care | Attending: Internal Medicine

## 2019-11-20 DIAGNOSIS — E785 Hyperlipidemia, unspecified: Secondary | ICD-10-CM | POA: Diagnosis not present

## 2019-11-20 DIAGNOSIS — R195 Other fecal abnormalities: Secondary | ICD-10-CM | POA: Insufficient documentation

## 2019-11-20 DIAGNOSIS — Z7982 Long term (current) use of aspirin: Secondary | ICD-10-CM | POA: Insufficient documentation

## 2019-11-20 DIAGNOSIS — Z8249 Family history of ischemic heart disease and other diseases of the circulatory system: Secondary | ICD-10-CM | POA: Diagnosis not present

## 2019-11-20 DIAGNOSIS — Z794 Long term (current) use of insulin: Secondary | ICD-10-CM | POA: Diagnosis not present

## 2019-11-20 DIAGNOSIS — I10 Essential (primary) hypertension: Secondary | ICD-10-CM | POA: Insufficient documentation

## 2019-11-20 DIAGNOSIS — K219 Gastro-esophageal reflux disease without esophagitis: Secondary | ICD-10-CM | POA: Diagnosis not present

## 2019-11-20 DIAGNOSIS — K635 Polyp of colon: Secondary | ICD-10-CM

## 2019-11-20 DIAGNOSIS — Z79899 Other long term (current) drug therapy: Secondary | ICD-10-CM | POA: Insufficient documentation

## 2019-11-20 DIAGNOSIS — E119 Type 2 diabetes mellitus without complications: Secondary | ICD-10-CM | POA: Insufficient documentation

## 2019-11-20 DIAGNOSIS — I519 Heart disease, unspecified: Secondary | ICD-10-CM | POA: Insufficient documentation

## 2019-11-20 DIAGNOSIS — K5909 Other constipation: Secondary | ICD-10-CM | POA: Insufficient documentation

## 2019-11-20 DIAGNOSIS — K5730 Diverticulosis of large intestine without perforation or abscess without bleeding: Secondary | ICD-10-CM | POA: Diagnosis not present

## 2019-11-20 DIAGNOSIS — D122 Benign neoplasm of ascending colon: Secondary | ICD-10-CM | POA: Insufficient documentation

## 2019-11-20 DIAGNOSIS — D120 Benign neoplasm of cecum: Secondary | ICD-10-CM | POA: Insufficient documentation

## 2019-11-20 DIAGNOSIS — K5140 Inflammatory polyps of colon without complications: Secondary | ICD-10-CM | POA: Diagnosis not present

## 2019-11-20 DIAGNOSIS — Q438 Other specified congenital malformations of intestine: Secondary | ICD-10-CM | POA: Diagnosis not present

## 2019-11-20 HISTORY — PX: COLONOSCOPY: SHX5424

## 2019-11-20 HISTORY — PX: POLYPECTOMY: SHX5525

## 2019-11-20 LAB — GLUCOSE, CAPILLARY: Glucose-Capillary: 102 mg/dL — ABNORMAL HIGH (ref 70–99)

## 2019-11-20 SURGERY — COLONOSCOPY
Anesthesia: Moderate Sedation

## 2019-11-20 MED ORDER — MIDAZOLAM HCL 5 MG/5ML IJ SOLN
INTRAMUSCULAR | Status: AC
  Filled 2019-11-20: qty 10

## 2019-11-20 MED ORDER — SODIUM CHLORIDE 0.9 % IV SOLN: INTRAVENOUS | Status: DC

## 2019-11-20 MED ORDER — MIDAZOLAM HCL 5 MG/5ML IJ SOLN
INTRAMUSCULAR | Status: DC | PRN
  Administered 2019-11-20: 2 mg via INTRAVENOUS
  Administered 2019-11-20 (×3): 1 mg via INTRAVENOUS

## 2019-11-20 MED ORDER — MEPERIDINE HCL 50 MG/ML IJ SOLN
INTRAMUSCULAR | Status: AC
  Filled 2019-11-20: qty 1

## 2019-11-20 MED ORDER — MEPERIDINE HCL 100 MG/ML IJ SOLN
INTRAMUSCULAR | Status: DC | PRN
  Administered 2019-11-20: 25 mg via INTRAVENOUS
  Administered 2019-11-20: 15 mg via INTRAVENOUS

## 2019-11-20 MED ORDER — ONDANSETRON HCL 4 MG/2ML IJ SOLN
INTRAMUSCULAR | Status: AC
  Filled 2019-11-20: qty 2

## 2019-11-20 MED ORDER — ONDANSETRON HCL 4 MG/2ML IJ SOLN
INTRAMUSCULAR | Status: DC | PRN
  Administered 2019-11-20: 4 mg via INTRAVENOUS

## 2019-11-20 MED ORDER — STERILE WATER FOR IRRIGATION IR SOLN: Status: DC | PRN

## 2019-11-20 NOTE — Discharge Instructions (Signed)
Colonoscopy Discharge Instructions  Read the instructions outlined below and refer to this sheet in the next few weeks. These discharge instructions provide you with general information on caring for yourself after you leave the hospital. Your doctor may also give you specific instructions. While your treatment has been planned according to the most current medical practices available, unavoidable complications occasionally occur. If you have any problems or questions after discharge, call Dr. Gala Romney at [phone]. ACTIVITY  You may resume your regular activity, but move at a slower pace for the next 24 hours.   Take frequent rest periods for the next 24 hours.   Walking will help get rid of the air and reduce the bloated feeling in your belly (abdomen).   No driving for 24 hours (because of the medicine (anesthesia) used during the test).    Do not sign any important legal documents or operate any machinery for 24 hours (because of the anesthesia used during the test).  NUTRITION  Drink plenty of fluids.   You may resume your normal diet as instructed by your doctor.   Begin with a light meal and progress to your normal diet. Heavy or fried foods are harder to digest and may make you feel sick to your stomach (nauseated).   Avoid alcoholic beverages for 24 hours or as instructed.  MEDICATIONS  You may resume your normal medications unless your doctor tells you otherwise.  WHAT YOU CAN EXPECT TODAY  Some feelings of bloating in the abdomen.   Passage of more gas than usual.   Spotting of blood in your stool or on the toilet paper.  IF YOU HAD POLYPS REMOVED DURING THE COLONOSCOPY:  No aspirin products for 7 days or as instructed.   No alcohol for 7 days or as instructed.   Eat a soft diet for the next 24 hours.  FINDING OUT THE RESULTS OF YOUR TEST Not all test results are available during your visit. If your test results are not back during the visit, make an appointment  with your caregiver to find out the results. Do not assume everything is normal if you have not heard from your caregiver or the medical facility. It is important for you to follow up on all of your test results.  SEEK IMMEDIATE MEDICAL ATTENTION IF:  You have more than a spotting of blood in your stool.   Your belly is swollen (abdominal distention).   You are nauseated or vomiting.   You have a temperature over 101.   You have abdominal pain or discomfort that is severe or gets worse throughout the day.   3 polyps removed from your colon today  Colon polyp and diverticulosis information provided  Further recommendations to follow pending review of pathology report  At patient request I called Kai Levins at [phone] and reviewed results  Need office visit with [place] in 4 weeks   Colon Polyps  Polyps are tissue growths inside the body. Polyps can grow in many places, including the large intestine (colon). A polyp may be a round bump or a mushroom-shaped growth. You could have one polyp or several. Most colon polyps are noncancerous (benign). However, some colon polyps can become cancerous over time. Finding and removing the polyps early can help prevent this. What are the causes? The exact cause of colon polyps is not known. What increases the risk? You are more likely to develop this condition if you:  Have a family history of colon cancer or colon polyps.  Are older than 56 or older than 45 if you are African American.  Have inflammatory bowel disease, such as ulcerative colitis or Crohn's disease.  Have certain hereditary conditions, such as: ? Familial adenomatous polyposis. ? Lynch syndrome. ? Turcot syndrome. ? Peutz-Jeghers syndrome.  Are overweight.  Smoke cigarettes.  Do not get enough exercise.  Drink too much alcohol.  Eat a diet that is high in fat and red meat and low in fiber.  Had childhood cancer that was treated with abdominal  radiation. What are the signs or symptoms? Most polyps do not cause symptoms. If you have symptoms, they may include:  Blood coming from your rectum when having a bowel movement.  Blood in your stool. The stool may look dark red or black.  Abdominal pain.  A change in bowel habits, such as constipation or diarrhea. How is this diagnosed? This condition is diagnosed with a colonoscopy. This is a procedure in which a lighted, flexible scope is inserted into the anus and then passed into the colon to examine the area. Polyps are sometimes found when a colonoscopy is done as part of routine cancer screening tests. How is this treated? Treatment for this condition involves removing any polyps that are found. Most polyps can be removed during a colonoscopy. Those polyps will then be tested for cancer. Additional treatment may be needed depending on the results of testing. Follow these instructions at home: Lifestyle  Maintain a healthy weight, or lose weight if recommended by your health care provider.  Exercise every day or as told by your health care provider.  Do not use any products that contain nicotine or tobacco, such as cigarettes and e-cigarettes. If you need help quitting, ask your health care provider.  If you drink alcohol, limit how much you have: ? 0-1 drink a day for women. ? 0-2 drinks a day for men.  Be aware of how much alcohol is in your drink. In the [place] one drink equals one 12 oz bottle of beer (355 mL), one 5 oz glass of wine (148 mL), or one 1 oz shot of hard liquor (44 mL). Eating and drinking   Eat foods that are high in fiber, such as fruits, vegetables, and whole grains.  Eat foods that are high in calcium and vitamin D, such as milk, cheese, yogurt, eggs, liver, fish, and broccoli.  Limit foods that are high in fat, such as fried foods and desserts.  Limit the amount of red meat and processed meat you eat, such as hot dogs, sausage, bacon, and lunch  meats. General instructions  Keep all follow-up visits as told by your health care provider. This is important. ? This includes having regularly scheduled colonoscopies. ? Talk to your health care provider about when you need a colonoscopy. Contact a health care provider if:  You have new or worsening bleeding during a bowel movement.  You have new or increased blood in your stool.  You have a change in bowel habits.  You lose weight for no known reason. Summary  Polyps are tissue growths inside the body. Polyps can grow in many places, including the colon.  Most colon polyps are noncancerous (benign), but some can become cancerous over time.  This condition is diagnosed with a colonoscopy.  Treatment for this condition involves removing any polyps that are found. Most polyps can be removed during a colonoscopy. This information is not intended to replace advice given to you by your health care provider. Make sure  you discuss any questions you have with your health care provider. Document Revised: 08/17/2017 Document Reviewed: 08/17/2017 Elsevier Patient Education  [place].  Diverticulosis  Diverticulosis is a condition that develops when small pouches (diverticula) form in the wall of the large intestine (colon). The colon is where water is absorbed and stool (feces) is formed. The pouches form when the inside layer of the colon pushes through weak spots in the outer layers of the colon. You may have a few pouches or many of them. The pouches usually do not cause problems unless they become inflamed or infected. When this happens, the condition is called diverticulitis. What are the causes? The cause of this condition is not known. What increases the risk? The following factors may make you more likely to develop this condition:  Being older than age 34. Your risk for this condition increases with age. Diverticulosis is rare among people younger than age 80. By age  66, many people have it.  Eating a low-fiber diet.  Having frequent constipation.  Being overweight.  Not getting enough exercise.  Smoking.  Taking over-the-counter pain medicines, like aspirin and ibuprofen.  Having a family history of diverticulosis. What are the signs or symptoms? In most people, there are no symptoms of this condition. If you do have symptoms, they may include:  Bloating.  Cramps in the abdomen.  Constipation or diarrhea.  Pain in the lower left side of the abdomen. How is this diagnosed? Because diverticulosis usually has no symptoms, it is most often diagnosed during an exam for other colon problems. The condition may be diagnosed by:  Using a flexible scope to examine the colon (colonoscopy).  Taking an X-ray of the colon after dye has been put into the colon (barium enema).  Having a CT scan. How is this treated? You may not need treatment for this condition. Your health care provider may recommend treatment to prevent problems. You may need treatment if you have symptoms or if you previously had diverticulitis. Treatment may include:  Eating a high-fiber diet.  Taking a fiber supplement.  Taking a live bacteria supplement (probiotic).  Taking medicine to relax your colon. Follow these instructions at home: Medicines  Take over-the-counter and prescription medicines only as told by your health care provider.  If told by your health care provider, take a fiber supplement or probiotic. Constipation prevention Your condition may cause constipation. To prevent or treat constipation, you may need to:  Drink enough fluid to keep your urine pale yellow.  Take over-the-counter or prescription medicines.  Eat foods that are high in fiber, such as beans, whole grains, and fresh fruits and vegetables.  Limit foods that are high in fat and processed sugars, such as fried or sweet foods.  General instructions  Try not to strain when you have  a bowel movement.  Keep all follow-up visits as told by your health care provider. This is important. Contact a health care provider if you:  Have pain in your abdomen.  Have bloating.  Have cramps.  Have not had a bowel movement in 3 days. Get help right away if:  Your pain gets worse.  Your bloating becomes very bad.  You have a fever or chills, and your symptoms suddenly get worse.  You vomit.  You have bowel movements that are bloody or black.  You have bleeding from your rectum. Summary  Diverticulosis is a condition that develops when small pouches (diverticula) form in the wall of the large  intestine (colon).  You may have a few pouches or many of them.  This condition is most often diagnosed during an exam for other colon problems.  Treatment may include increasing the fiber in your diet, taking supplements, or taking medicines. This information is not intended to replace advice given to you by your health care provider. Make sure you discuss any questions you have with your health care provider. Document Revised: 11/29/2018 Document Reviewed: 11/29/2018 Elsevier Patient Education  [place].

## 2019-11-20 NOTE — Op Note (Signed)
[facility] Patient Name: Joanna Brown Procedure Date: 11/20/2019 10:00 AM MRN: [id number] Date of Birth: 04-26-1941 Attending MD: Norvel Richards , MD CSN: [id number] Age: 79 Admit Type: Outpatient Procedure:                Colonoscopy Indications:              Positive Cologuard test Providers:                Norvel Richards, MD, Hinton Rao, RN, Raphael Gibney Tech., Technician Referring MD:              Medicines:                Midazolam 5 mg IV, Meperidine 50 mg IV, Ondansetron                            4 mg IV Complications:            No immediate complications. Estimated Blood Loss:     Estimated blood loss was minimal. Procedure:                Pre-Anesthesia Assessment:                           - Prior to the procedure, a History and Physical                            was performed, and patient medications and                            allergies were reviewed. The patient's tolerance of                            previous anesthesia was also reviewed. The risks                            and benefits of the procedure and the sedation                            options and risks were discussed with the patient.                            All questions were answered, and informed consent                            was obtained. Prior Anticoagulants: The patient has                            taken no previous anticoagulant or antiplatelet                            agents. ASA Grade Assessment: III - A patient with  severe systemic disease. After reviewing the risks                            and benefits, the patient was deemed in                            satisfactory condition to undergo the procedure.                           After obtaining informed consent, the colonoscope                            was passed under direct vision. Throughout the                            procedure, the  patient's blood pressure, pulse, and                            oxygen saturations were monitored continuously. The                            CF-HQ190L ([id number]) scope was introduced through                            the anus and advanced to the the cecum, identified                            by appendiceal orifice and ileocecal valve. The                            colonoscopy was performed without difficulty. The                            patient tolerated the procedure well. The quality                            of the bowel preparation was adequate. Scope In: 10:16:39 AM Scope Out: 11:10:34 AM Scope Withdrawal Time: 0 hours 29 minutes 10 seconds  Total Procedure Duration: 0 hours 53 minutes 55 seconds  Findings:      The perianal and digital rectal examinations were normal.      Scattered medium-mouthed diverticula were found in the entire colon.      Two semi-pedunculated polyps were found in the ascending colon. The       polyps were 10 to 11 mm in size. These polyps were removed with a hot       snare. Resection and retrieval were complete. Estimated blood loss: none.      A 5 mm polyp was found in the cecum. The polyp was sessile. The polyp       was removed with a cold snare. Resection and retrieval were complete.       Estimated blood loss was minimal.      The exam was otherwise without abnormality on direct and retroflexion       views. Patient's colon was redundant and elongated. It required external  abdominal pressure and changing the patient's position to reach the       cecum. Impression:               - Diverticulosis in the entire examined colon.                           - Two 10 to 11 mm polyps in the ascending colon,                            removed with a hot snare. Resected and retrieved.                           - One 5 mm polyp in the cecum, removed with a cold                            snare. Resected and retrieved. Redundant colon.                            - The examination was otherwise normal on direct                            and retroflexion views.                           -Of note, patient states Linzess 290 daily did not                            help at all with her constipation. She was very                            well prep for her colonoscopy today, however. Moderate Sedation:      Moderate (conscious) sedation was administered by the endoscopy nurse       and supervised by the endoscopist. The following parameters were       monitored: oxygen saturation, heart rate, blood pressure, respiratory       rate, EKG, adequacy of pulmonary ventilation, and response to care.       Total physician intraservice time was 57 minutes. Recommendation:           - Patient has a contact number available for                            emergencies. The signs and symptoms of potential                            delayed complications were discussed with the                            patient. Return to normal activities tomorrow.                            Written discharge instructions were provided to the  patient.                           - Resume previous diet.                           - Continue present medications.                           - Repeat colonoscopy date to be determined after                            pending pathology results are reviewed for                            surveillance based on pathology results.                           - Return to GI office in 1 month to reevaluate                            constipation.. Procedure Code(s):        --- Professional ---                           [phone], Colonoscopy, flexible; with removal of                            tumor(s), polyp(s), or other lesion(s) by snare                            technique                           99153, Moderate sedation; each additional 15                            minutes intraservice time                            99153, Moderate sedation; each additional 15                            minutes intraservice time                           99153, Moderate sedation; each additional 15                            minutes intraservice time                           G0500, Moderate sedation services provided by the                            same physician or other qualified health care  professional performing a gastrointestinal                            endoscopic service that sedation supports,                            requiring the presence of an independent trained                            observer to assist in the monitoring of the                            patient's level of consciousness and physiological                            status; initial 15 minutes of intra-service time;                            patient age 1 years or older (additional time may                            be reported with [phone], as appropriate) Diagnosis Code(s):        --- Professional ---                           K63.5, Polyp of colon                           R19.5, Other fecal abnormalities                           K57.30, Diverticulosis of large intestine without                            perforation or abscess without bleeding CPT copyright 2019 [employer]. All rights reserved. The codes documented in this report are preliminary and upon coder review may  be revised to meet current compliance requirements. Joanna Brown. Joanna Seckel, MD Norvel Richards, MD 11/20/2019 11:23:16 AM This report has been signed electronically. Number of Addenda: 0

## 2019-11-20 NOTE — H&P (Signed)
_0 @   Primary Care Physician:  Fayrene Helper, MD Primary Gastroenterologist:  Dr. Gala Romney  Pre-Procedure History & Physical: HPI:  Joanna Brown is a 79 y.o. female here for diagnostic colonoscopy.  Positive Cologuard.  Colonoscopy previously done 2015-hyperplastic polyps.  Chronic constipation.  Took Linzess 290 daily did not feel that it helped.  However, she states she got good results with her colonoscopy preparation.  Past Medical History:  Diagnosis Date  . Blood transfusion    1980  . Diabetes mellitus   . Dysrhythmia   . Female bladder prolapse   . GERD 11/18/2008   Qualifier: Diagnosis of  By: Craige Cotta    . GERD (gastroesophageal reflux disease)   . Heart disease   . Hyperlipidemia   . Hypertension    echo and stress 4/10 reports on chart, EKG ` LOV 9/12 on chart  . Mild aortic stenosis     Past Surgical History:  Procedure Laterality Date  . ABDOMINAL HYSTERECTOMY    . ANTERIOR AND POSTERIOR REPAIR  04/26/2011   Procedure: ANTERIOR (CYSTOCELE) AND POSTERIOR REPAIR (RECTOCELE);  Surgeon: Reece Packer, MD;  Location: [facility];  Service: Urology;  Laterality: N/A;  . CATARACT EXTRACTION Bilateral    with IOL  . CHOLECYSTECTOMY  2009  . COLONOSCOPY N/A 12/02/2013   three colon polyps removed, small internal hemorrhoids. Hyperplastic polyps  . COLONOSCOPY W/ POLYPECTOMY    . LEFT HEART CATH  09/10/2008   normal coronary arteries, normal LV systolic function, EF 28% (Dr. [name])  . LYMPH NODE DISSECTION Right 1997   under arm  . NM MYOCAR PERF WALL MOTION  2010   dipyridamole - mild-mod in intenstiy perfusion defect in mid anterior, mid anteroseptal wall, EF 70%  . OVARY SURGERY     bilateral tumors removed  . THYROIDECTOMY    . THYROIDECTOMY  02/2008  . TRANSTHORACIC ECHOCARDIOGRAM  08/2011   EF=>55%, mild conc LVH; trace MR; mild TR; mild-mod AV calcification with mild valvular AV stenosis  . VAGINAL PROLAPSE REPAIR  04/26/2011   Procedure:  VAGINAL VAULT SUSPENSION;  Surgeon: Reece Packer, MD;  Location: [facility];  Service: Urology;  Laterality: N/A;  with Graft  10x6    Prior to Admission medications   Medication Sig Start Date End Date Taking? Authorizing Provider  ALPHAGAN P 0.1 % SOLN Apply 1 drop to eye every morning. 09/16/19  Yes [provider]  amLODipine (NORVASC) 10 MG tablet TAKE 1 TABLET(10 MG) BY MOUTH EVERY MORNING Patient taking differently: Take 10 mg by mouth daily. TAKE 1 TABLET(10 MG) BY MOUTH EVERY MORNING 08/15/19  Yes Fayrene Helper, MD  Ascorbic Acid (VITAMIN C) 1000 MG tablet Take 1,000 mg by mouth 4 (four) times a week.   Yes [provider]  aspirin 81 MG tablet Take 81 mg by mouth every other day.    Yes [provider]  B-D ULTRAFINE III SHORT PEN 31G X 8 MM MISC USE AS DIRECTED AT BEDTIME WITH INSULIN PENS 10/07/19  Yes Nida, Marella Chimes, MD  benazepril (LOTENSIN) 40 MG tablet TAKE 1 TABLET(40 MG) BY MOUTH DAILY Patient taking differently: Take 40 mg by mouth daily.  08/30/19  Yes Fayrene Helper, MD  Blood Glucose Monitoring Suppl (ACCU-CHEK GUIDE) w/Device KIT 1 each by Does not apply route 4 (four) times daily. 05/08/19  Yes Nida, Marella Chimes, MD  Calcium Carbonate-Vit D-Min (CALCIUM 1200) 1200-1000 MG-UNIT CHEW Chew 1 each by mouth 4 (four) times  a week.    Yes [provider]  cholecalciferol (VITAMIN D3) 25 MCG (1000 UT) tablet Take 1,000 Units by mouth daily.   Yes [provider]  ezetimibe (ZETIA) 10 MG tablet Take 1 tablet (10 mg total) by mouth daily. 01/29/19  Yes Cleaver, Jossie Ng, NP  glipiZIDE (GLUCOTROL XL) 5 MG 24 hr tablet TAKE 2 TABLET BY MOUTH DAILY WITH BREAKFAST Patient taking differently: Take 10 mg by mouth daily with breakfast. TAKE 2 TABLET BY MOUTH DAILY WITH BREAKFAST 08/08/19  Yes Nida, Marella Chimes, MD  glucose blood (ACCU-CHEK GUIDE) test strip Use as instructed q.i.d  E11.65 05/08/19  Yes Nida, Marella Chimes, MD   insulin detemir (LEVEMIR FLEXTOUCH) 100 UNIT/ML FlexPen Inject 80 Units into the skin at bedtime. 11/08/19  Yes Nida, Marella Chimes, MD  Lancets Ultra Fine MISC 1 each by Does not apply route 4 (four) times daily. 05/08/19  Yes Nida, Marella Chimes, MD  latanoprost (XALATAN) 0.005 % ophthalmic solution Place 1 drop into both eyes at bedtime.  02/18/19  Yes [provider]  metFORMIN (GLUCOPHAGE) 500 MG tablet TAKE 1 TABLET BY MOUTH EVERY DAY WITH BREAKFAST Patient taking differently: Take 500 mg by mouth daily with breakfast. TAKE 1 TABLET BY MOUTH EVERY DAY WITH BREAKFAST 10/25/19  Yes Nida, Marella Chimes, MD  Multiple Vitamins-Minerals (CENTRUM) tablet Take 1 tablet by mouth every morning.    Yes [provider]  Omega-3 Fatty Acids (FISH OIL) 1200 MG CAPS Take 1 capsule by mouth every morning.    Yes [provider]  pravastatin (PRAVACHOL) 80 MG tablet TAKE 1 TABLET(80 MG) BY MOUTH DAILY 11/14/19  Yes Fayrene Helper, MD  triamterene-hydrochlorothiazide (MAXZIDE) 75-50 MG tablet TAKE 1 TABLET BY MOUTH DAILY 08/30/19  Yes Fayrene Helper, MD    Allergies as of 10/10/2019 - Review Complete 10/10/2019  Allergen Reaction Noted  . Senokot wheat bran [wheat bran]  01/18/2018  . Spironolactone  03/24/2014    Family History  Problem Relation Age of Onset  . Stomach cancer Mother 110  . Heart disease Mother 31       heart disease  . Heart disease Father 36       MI  . Stroke Maternal Grandfather   . Heart attack Paternal Grandfather   . Hypertension Brother   . Bone cancer Brother   . Hypertension Sister   . Liver cancer Sister   . Hypertension Sister   . Hypertension Child   . Colon cancer Neg Hx   . Colon polyps Neg Hx     Social History   Socioeconomic History  . Marital status: Married    Spouse name: [name]  . Number of children: 1  . Years of education: Trade  . Highest education level: 12th grade  Occupational History  . Occupation:  Retired  Tobacco Use  . Smoking status: Never Smoker  . Smokeless tobacco: Never Used  Vaping Use  . Vaping Use: Never used  Substance and Sexual Activity  . Alcohol use: No    Comment: socially- none x 30 years  . Drug use: No  . Sexual activity: Yes  Other Topics Concern  . Not on file  Social History Narrative   Patient lives at home with spouse. 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 LIVED IN [place] FOR 50 YRS AND CAME BACK TO [place] ~2009. Caffeine Use: 1 cup of coffee daily. HAD ONE CHILD: PASSED 5 YRS. HAVE THREE GRAND-KIDS AND TWO  GREAT GRANDS.    Social Determinants of Health   Financial Resource Strain: Low Risk   . Difficulty of Paying Living Expenses: Not hard at all  Food Insecurity:   . Worried About Charity fundraiser in the Last Year:   . Arboriculturist in the Last Year:   Transportation Needs: No Transportation Needs  . Lack of Transportation (Medical): No  . Lack of Transportation (Non-Medical): No  Physical Activity: Insufficiently Active  . Days of Exercise per Week: 3 days  . Minutes of Exercise per Session: 20 min  Stress: No Stress Concern Present  . Feeling of Stress : Only a little  Social Connections: Socially Integrated  . Frequency of Communication with Friends and Family: Twice a week  . Frequency of Social Gatherings with Friends and Family: Twice a week  . Attends Religious Services: More than 4 times per year  . Active Member of Clubs or Organizations: No  . Attends Archivist Meetings: 1 to 4 times per year  . Marital Status: Married  Human resources officer Violence: Not At Risk  . Fear of Current or Ex-Partner: No  . Emotionally Abused: No  . Physically Abused: No  . Sexually Abused: No    Review of Systems: See HPI, otherwise negative ROS  Physical Exam: BP 133/79   Pulse 80   Temp 99.1 F (37.3 C) (Oral)   Resp (!) 22   Ht _0  (1.651 m)   SpO2 99%   BMI 29.85 kg/m  General:   Alert,   Well-developed, well-nourished, pleasant and cooperative in NAD Neck:  Supple; no masses or thyromegaly. No significant cervical adenopathy. Lungs:  Clear throughout to auscultation.   No wheezes, crackles, or rhonchi. No acute distress. Heart:  Regular rate and rhythm; no murmurs, clicks, rubs,  or gallops. Abdomen: Non-distended, normal bowel sounds.  Soft and nontender without appreciable mass or hepatosplenomegaly.  Pulses:  Normal pulses noted. Extremities:  Without clubbing or edema.  Impression/Plan: 79 year old lady with a positive Cologuard.  Here for diagnostic colonoscopy. Chronic, refractory constipation.  Diagnostic colonoscopy today per plan. .The risks, benefits, limitations, alternatives and imponderables have been reviewed with the patient. Questions have been answered. All parties are agreeable.      Notice: This dictation was prepared with Dragon dictation along with smaller phrase technology. Any transcriptional errors that result from this process are unintentional and may not be corrected upon review.

## 2019-11-22 ENCOUNTER — Encounter: Payer: Self-pay | Admitting: Internal Medicine

## 2019-11-25 ENCOUNTER — Ambulatory Visit (HOSPITAL_COMMUNITY): Payer: Medicare Other | Admitting: Physical Therapy

## 2019-11-25 ENCOUNTER — Telehealth (HOSPITAL_COMMUNITY): Payer: Self-pay | Admitting: Physical Therapy

## 2019-11-25 NOTE — Telephone Encounter (Signed)
S/w pt she wants to cx and call us back to r/s- if she hasn't call us back in 2weeks we can call her.

## 2019-11-27 ENCOUNTER — Other Ambulatory Visit: Payer: Self-pay

## 2019-11-27 ENCOUNTER — Ambulatory Visit (INDEPENDENT_AMBULATORY_CARE_PROVIDER_SITE_OTHER): Payer: Medicare Other | Admitting: Internal Medicine

## 2019-11-27 ENCOUNTER — Telehealth: Payer: Self-pay | Admitting: Family Medicine

## 2019-11-27 ENCOUNTER — Encounter: Payer: Self-pay | Admitting: Internal Medicine

## 2019-11-27 VITALS — BP 120/72 | HR 89 | Ht 65.0 in | Wt 183.8 lb

## 2019-11-27 DIAGNOSIS — R079 Chest pain, unspecified: Secondary | ICD-10-CM | POA: Diagnosis not present

## 2019-11-27 DIAGNOSIS — I10 Essential (primary) hypertension: Secondary | ICD-10-CM

## 2019-11-27 DIAGNOSIS — I350 Nonrheumatic aortic (valve) stenosis: Secondary | ICD-10-CM

## 2019-11-27 DIAGNOSIS — C189 Malignant neoplasm of colon, unspecified: Secondary | ICD-10-CM

## 2019-11-27 NOTE — Telephone Encounter (Signed)
Pt wants her surgeon to be in [place] not Dr. Felipe Drone (wants to change the referral)

## 2019-11-27 NOTE — Patient Instructions (Addendum)
Medication Instructions:  Your physician recommends that you continue on your current medications as directed. Please refer to the Current Medication list given to you today.  *If you need a refill on your cardiac medications before your next appointment, please call your pharmacy*   Testing/Procedures: Echocardiogram in 6 months @ [place]: At [facility] you and your health needs are our priority.  As part of our continuing mission to provide you with exceptional heart care, we have created designated Provider Care Teams.  These Care Teams include your primary Cardiologist (physician) and Advanced Practice Providers (APPs -  Physician Assistants and Nurse Practitioners) who all work together to provide you with the care you need, when you need it.  We recommend signing up for the patient portal called "MyChart".  Sign up information is provided on this After Visit Summary.  MyChart is used to connect with patients for Virtual Visits (Telemedicine).  Patients are able to view lab/test results, encounter notes, upcoming appointments, etc.  Non-urgent messages can be sent to your provider as well.   To learn more about what you can do with MyChart, go to [email or web address].    Your next appointment:   6 month(s) - after echocardiogram  The format for your next appointment:   In Person  Provider:   Raliegh Ip Mali Hilty, MD   Other Instructions [facility]  Leighton Ruff, [place], [place], [place]

## 2019-11-27 NOTE — Progress Notes (Signed)
OFFICE NOTE  Chief Complaint:  Chest pain  Primary Care Physician: Fayrene Helper, MD  HPI:  Joanna Brown is a 79 year old female who has a history of mild aortic stenosis with a valve area of approximately 1.7 cm, also a history of dyslipidemia and hypertension, both of which have been well controlled. We are seeing her back for an annual visit today. She reports actually feeling very well, started walking every day, has made major changes to her diet as reflected by a marked decrease in triglycerides. Unfortunately recently she had a mild elevation in liver enzymes slightly above normal on lovastatin. Typically we could tolerate liver enzymes up to 3 times normal on statin medications, however, she was taken off the lovastatin. Either way it is questionable whether she really needs the additional statin at this time and this certainly argues that she should have further workup as to why she had elevated liver enzymes, whether this is due to a new non-alcoholic steatohepatitis or perhaps concomitant medications causing her elevated liver enzymes.  In fact, I reviewed her CT scan today he years ago and she does have steatohepatitis.  Therefore she is not a good candidate for a statin medication. Her blood pressure seems to be well-controlled on her current regimen.  I saw Joanna Brown back in the office today. She is without any new complaints. She occasionally gets some heartburn and is not currently taking medication for that. She denies any chest pain or worsening shortness of breath.  Joanna Brown returns today for follow-up. Again she is without complaints. We discussed her aortic stenosis however she seemed to have little recollection that she has this disorder. I again went over aortic stenosis and the fact that she has mild to moderate narrowing of the aortic valve. This time we are monitoring it clinically. Her last echo was in April of last year. She is asymptomatic. Her blood  pressure is well controlled. She had recent laboratory work which shows an LDL cholesterol of 70 in October which is good control. Her hemoglobin A1c is 7 which is down from 8.3 prior to that. I've encouraged her to continue with this trend is good cholesterol and blood sugar control her helpful in slowing the process of aortic stenosis.  09/12/2016  Joanna Brown was seen today in follow-up. Overall she seems to be doing well. She has no complaints such as shortness of breath or chest pain. EKG is stable showing normal sinus rhythm at 70 with LAFB. Blood pressure is at goal today. She reports her 11 A1c is in the low 7 range. Cholesterol is also been fairly well controlled. She does have a history of moderate aortic stenosis and is due for repeat echo.  09/12/2017  Joanna Brown was seen today in follow-up.  Over the past year she denies any chest pain or worsening shortness of breath.  Unfortunately her hemoglobin A1c is up from the low sevens to 8.2.  Cholesterol also is higher than goal.  Her total cholesterol is 171, HDL 40, LDL 97 and triglycerides 227.  She reports compliance with pravastatin.  She has moderate aortic stenosis with a mean gradient of 20 mmHg based on echo last year and normal LV function.  She did report one brief episode of chest discomfort with more marked exertion a few days ago.  This was right sided underneath the right breast and was a crampy sensation which improved after resting.  I advised that she monitor that further and if  she has more recurrence we may need to consider stress testing.  12/31/2018  Joanna Brown is seen today for routine follow-up.  She was seen in April 2019.  Since then she reports she was doing fairly well up to about 2 weeks ago.  She has had some worsening shortness of breath, particularly at night and while laying down.  She has a mild nonproductive cough.  She also reports some occasional lower extremity edema.  She gets some shortness of breath with  exertion but denies any chest pain, pressure, heaviness or other anginal symptoms.  EKG today shows incomplete right bundle branch pattern.  Her diabetes not well controlled with A1c recently of 8.8.  Her recent cholesterol profile in February showed total cholesterol 145, triglycerides 225, HDL 41 and LDL 73.  She has known aortic stenosis which is at least moderate however not assessed since 2018 by echo.  04/30/2019  Joanna Brown returns today for follow-up.  She had an echo in August 2020 which showed an EF 50 to 55%, moderate LVH, grade 1 diastolic dysfunction.  There was moderate to severe aortic stenosis with a mean gradient of 21 mmHg, AVA by VTI was 1 cm with a dimensionless index of 0.21.  Symptom wise she does report some shortness of breath with exertion but is not consistent.  She has required some pillows to elevate her head at night.  She denies any chest pain at rest.  She has had no presyncopal or syncopal episodes.  10/24/2019  Joanna Brown is seen today in follow-up.  She reports that she has had some chest pain on and off for the past 2 weeks.  She thought initially it might be something that she ate however she has had several episodes with doing house work and other activities that do improve with rest.  She was supposed to have a repeat echo in December and actually is scheduled for repeat echo next week.  On exam today her aortic valve sounds moderately severe.  These could be symptoms associated with the valve.  EKG was personally reviewed shows no new ischemic changes.  11/27/2019  Joanna Brown is seen today in follow-up.  She recently had an echocardiogram to evaluate her aortic stenosis.  As expected the valve is moderately severe.  LVEF is normal with mean gradient 29 mmHg and dimensionless index of 0.26.  Aortic valve area 0.91 cm.  This is more consistent with moderate to severe AS and it appeared visually severely stenotic suggesting possible low-flow low gradient severe aortic  stenosis.  After discussing with her today does not seem like she is having any exertional symptoms, chest pain, heart failure or presyncopal symptoms.  Unfortunately recently she underwent a colonoscopy was found to have a cancerous polyp.  That was removed however a surgical consultation was recommended.  The patient is hesitant with her surgical recommendation and wishes to see another specialist in [place] if possible.  PMHx:  Past Medical History:  Diagnosis Date  . Blood transfusion    1980  . Diabetes mellitus   . Dysrhythmia   . Female bladder prolapse   . GERD 11/18/2008   Qualifier: Diagnosis of  By: Craige Cotta    . GERD (gastroesophageal reflux disease)   . Heart disease   . Hyperlipidemia   . Hypertension    echo and stress 4/10 reports on chart, EKG ` LOV 9/12 on chart  . Mild aortic stenosis     Past Surgical History:  Procedure Laterality Date  .  ABDOMINAL HYSTERECTOMY    . ANTERIOR AND POSTERIOR REPAIR  04/26/2011   Procedure: ANTERIOR (CYSTOCELE) AND POSTERIOR REPAIR (RECTOCELE);  Surgeon: Reece Packer, MD;  [facility]: [facility];  Service: Urology;  Laterality: N/A;  . CATARACT EXTRACTION Bilateral    with IOL  . CHOLECYSTECTOMY  2009  . COLONOSCOPY N/A 12/02/2013   three colon polyps removed, small internal hemorrhoids. Hyperplastic polyps  . COLONOSCOPY W/ POLYPECTOMY    . LEFT HEART CATH  09/10/2008   normal coronary arteries, normal LV systolic function, EF 42% (Dr. [name])  . LYMPH NODE DISSECTION Right 1997   under arm  . NM MYOCAR PERF WALL MOTION  2010   dipyridamole - mild-mod in intenstiy perfusion defect in mid anterior, mid anteroseptal wall, EF 70%  . OVARY SURGERY     bilateral tumors removed  . THYROIDECTOMY    . THYROIDECTOMY  02/2008  . TRANSTHORACIC ECHOCARDIOGRAM  08/2011   EF=>55%, mild conc LVH; trace MR; mild TR; mild-mod AV calcification with mild valvular AV stenosis  . VAGINAL PROLAPSE REPAIR  04/26/2011   Procedure: VAGINAL  VAULT SUSPENSION;  Surgeon: Reece Packer, MD;  Location: [facility];  Service: Urology;  Laterality: N/A;  with Graft  10x6    FAMHx:  Family History  Problem Relation Age of Onset  . Stomach cancer Mother 71  . Heart disease Mother 71       heart disease  . Heart disease Father 3       MI  . Stroke Maternal Grandfather   . Heart attack Paternal Grandfather   . Hypertension Brother   . Bone cancer Brother   . Hypertension Sister   . Liver cancer Sister   . Hypertension Sister   . Hypertension Child   . Colon cancer Neg Hx   . Colon polyps Neg Hx     SOCHx:   reports that she has never smoked. She has never used smokeless tobacco. She reports that she does not drink alcohol and does not use drugs.  ALLERGIES:  Allergies  Allergen Reactions  . Senokot Wheat Bran [Wheat Bran]     ABDOMINAL CRAMPS  . Spironolactone     Stomach problems, vision changes     ROS: Pertinent items noted in HPI and remainder of comprehensive ROS otherwise negative.  HOME MEDS: Current Outpatient Medications  Medication Sig Dispense Refill  . ALPHAGAN P 0.1 % SOLN Apply 1 drop to eye every morning.    Marland Kitchen amLODipine (NORVASC) 10 MG tablet TAKE 1 TABLET(10 MG) BY MOUTH EVERY MORNING (Patient taking differently: Take 10 mg by mouth daily. TAKE 1 TABLET(10 MG) BY MOUTH EVERY MORNING) 90 tablet 3  . Ascorbic Acid (VITAMIN C) 1000 MG tablet Take 1,000 mg by mouth 4 (four) times a week.    Marland Kitchen aspirin 81 MG tablet Take 81 mg by mouth every other day.     . B-D ULTRAFINE III SHORT PEN 31G X 8 MM MISC USE AS DIRECTED AT BEDTIME WITH INSULIN PENS 100 each 3  . benazepril (LOTENSIN) 40 MG tablet TAKE 1 TABLET(40 MG) BY MOUTH DAILY (Patient taking differently: Take 40 mg by mouth daily. ) 90 tablet 1  . Blood Glucose Monitoring Suppl (ACCU-CHEK GUIDE) w/Device KIT 1 each by Does not apply route 4 (four) times daily. 1 kit 0  . Calcium Carbonate-Vit D-Min (CALCIUM 1200) 1200-1000 MG-UNIT CHEW Chew 1 each by  mouth 4 (four) times a week.     . cholecalciferol (VITAMIN D3)  25 MCG (1000 UT) tablet Take 1,000 Units by mouth daily.    Marland Kitchen ezetimibe (ZETIA) 10 MG tablet Take 1 tablet (10 mg total) by mouth daily. 90 tablet 3  . glipiZIDE (GLUCOTROL XL) 5 MG 24 hr tablet TAKE 2 TABLET BY MOUTH DAILY WITH BREAKFAST (Patient taking differently: Take 10 mg by mouth daily with breakfast. TAKE 2 TABLET BY MOUTH DAILY WITH BREAKFAST) 60 tablet 3  . glucose blood (ACCU-CHEK GUIDE) test strip Use as instructed q.i.d  E11.65 150 each 5  . insulin detemir (LEVEMIR FLEXTOUCH) 100 UNIT/ML FlexPen Inject 80 Units into the skin at bedtime. 10 pen 2  . Lancets Ultra Fine MISC 1 each by Does not apply route 4 (four) times daily. 150 each 5  . latanoprost (XALATAN) 0.005 % ophthalmic solution Place 1 drop into both eyes at bedtime.     . metFORMIN (GLUCOPHAGE) 500 MG tablet TAKE 1 TABLET BY MOUTH EVERY DAY WITH BREAKFAST (Patient taking differently: Take 500 mg by mouth daily with breakfast. TAKE 1 TABLET BY MOUTH EVERY DAY WITH BREAKFAST) 60 tablet 2  . Multiple Vitamins-Minerals (CENTRUM) tablet Take 1 tablet by mouth every morning.     . Omega-3 Fatty Acids (FISH OIL) 1200 MG CAPS Take 1 capsule by mouth every morning.     . pravastatin (PRAVACHOL) 80 MG tablet TAKE 1 TABLET(80 MG) BY MOUTH DAILY 90 tablet 1  . triamterene-hydrochlorothiazide (MAXZIDE) 75-50 MG tablet TAKE 1 TABLET BY MOUTH DAILY 90 tablet 1   No current facility-administered medications for this visit.    LABS/IMAGING: No results found for this or any previous visit (from the past 48 hour(s)). No results found.  VITALS: BP 120/72 (BP Location: Left Arm, Patient Position: Sitting, Cuff Size: Normal)   Pulse 89   Ht 5' 5" (1.651 m)   Wt 183 lb 12.8 oz (83.4 kg)   BMI 30.59 kg/m   EXAM: General appearance: alert and no distress Neck: no carotid bruit and no JVD Lungs: clear to auscultation bilaterally Heart: regular rate and rhythm, S1, S2  normal and systolic murmur: late systolic 3/6, crescendo at 2nd left intercostal space Abdomen: soft, non-tender; bowel sounds normal; no masses,  no organomegaly Extremities: extremities normal, atraumatic, no cyanosis or edema Pulses: 2+ and symmetric Skin: Skin color, texture, turgor normal. No rashes or lesions Neurologic: Grossly normal Psych: Mood, affect normal  EKG: Normal sinus rhythm at 89, LAFB-personally reviewed  ASSESSMENT: 1. Chest pain 2. Hypertension-controlled 3. Dyslipidemia 4. Moderate to severe aortic stenosis -mean gradient 29 mmHg, AVA 0.91 cm, DI 0.26 (10/2019) 5. NAFLD 6. GERD 7. DM2 on insulin  PLAN: 1.   Joanna. Huante does not seem to endorse any concerning symptoms at this time although has moderate to severe aortic stenosis.  This will likely progress further and I suspect will need to be treated within the next year.  I would recommend a repeat echo in 6 months.  Unfortunately she recently had colonoscopy which showed a cancerous polyp.  She says that she was told that her GI doctor "got it all", however it was recommended that she followed up with a surgeon in [place].  She does not want to go to [facility] and asked for another recommendation.  I provided her with a referral per her request to [facility] surgery who is a colorectal specialist.  Follow-up with me in 83month after repeat echo.  KPixie Casino MD, [facility]  Medical Director of the [facility] Lipid [facility]  Cardiovascular Risk Reduction [facility] Diplomate of the American Board of Clinical Lipidology Attending Cardiologist  Direct Dial: [phone]  Fax: [phone]  Website:  [email or web address] 11/27/2019, 3:28 PM

## 2019-11-27 NOTE — Telephone Encounter (Signed)
Spoke with patient and advised her that Dr.Bridges would be her Psychologist, sport and exercise. Once she realized the surgeon was a female she wants to keep the referral as placed. She will wait for them to call her to schedule her an appt.

## 2019-11-28 ENCOUNTER — Telehealth: Payer: Self-pay | Admitting: General Surgery

## 2019-11-28 NOTE — Telephone Encounter (Signed)
Called patient to schedule appointment per referral and she stated that she saw her Cardiologist on 11-27-2019 and has suggested that she see a Psychologist, sport and exercise in [place]. (Dr. Marcello Moores at [facility]) The patient stated that she trusted her cardiologist and would rather see the other surgeon. I called Dr. Roseanne Kaufman office and spoke with Rosendo Gros and explained the situation to her. She stated that she would call and speak with the patient and let Dr. Gala Romney know.

## 2019-11-28 NOTE — Progress Notes (Signed)
I received a call from [place] with Dr. Arnoldo Morale office stating she called the patient to schedule her appt and the patient asked that we send her referral to Dr. Marcello Moores at [place].

## 2019-11-28 NOTE — Progress Notes (Signed)
Noted  

## 2019-11-28 NOTE — Progress Notes (Signed)
Routing to Dr. Gala Romney as a FYI the patient would like to be seen at [place].  Referral made to Dr. Leighton Ruff and appt scheduled 12/09/19 at 9:40 am and the patient is aware.

## 2019-11-28 NOTE — Progress Notes (Signed)
I spoke with Barbera Setters at [facility] and she said the patient has an appt with Dr. Leighton Ruff on 0/68/93.  However they were able to get her scheduled sooner on 12/09/19 at 9:40 am

## 2019-11-28 NOTE — Progress Notes (Signed)
I spoke with the patient and made her aware of her new appointment.  She voiced understanding and the call was disconnected.

## 2019-11-28 NOTE — Progress Notes (Signed)
Agree 

## 2019-12-04 ENCOUNTER — Other Ambulatory Visit: Payer: Self-pay

## 2019-12-04 ENCOUNTER — Ambulatory Visit (HOSPITAL_COMMUNITY)
Admission: RE | Admit: 2019-12-04 | Discharge: 2019-12-04 | Disposition: A | Discharge status: Home / Self Care | Payer: Medicare Other | Source: Ambulatory Visit | Attending: Family Medicine | Admitting: Family Medicine

## 2019-12-04 DIAGNOSIS — Z1231 Encounter for screening mammogram for malignant neoplasm of breast: Secondary | ICD-10-CM | POA: Insufficient documentation

## 2019-12-05 ENCOUNTER — Telehealth: Payer: Self-pay | Admitting: Internal Medicine

## 2019-12-05 ENCOUNTER — Ambulatory Visit (HOSPITAL_COMMUNITY)
Admission: RE | Admit: 2019-12-05 | Discharge: 2019-12-05 | Disposition: A | Discharge status: Home / Self Care | Payer: Medicare Other | Source: Ambulatory Visit | Attending: Gastroenterology | Admitting: Gastroenterology

## 2019-12-05 ENCOUNTER — Telehealth: Payer: Self-pay | Admitting: *Deleted

## 2019-12-05 DIAGNOSIS — R918 Other nonspecific abnormal finding of lung field: Secondary | ICD-10-CM | POA: Diagnosis not present

## 2019-12-05 DIAGNOSIS — C189 Malignant neoplasm of colon, unspecified: Secondary | ICD-10-CM

## 2019-12-05 DIAGNOSIS — C182 Malignant neoplasm of ascending colon: Secondary | ICD-10-CM | POA: Diagnosis not present

## 2019-12-05 LAB — POCT I-STAT CREATININE: Creatinine, Ser: 1.3 mg/dL — ABNORMAL HIGH (ref 0.44–1.00)

## 2019-12-05 MED ORDER — IOHEXOL 350 MG/ML SOLN
100.0000 mL | Freq: Once | INTRAVENOUS | Status: AC | PRN
  Administered 2019-12-05: 100 mL via INTRAVENOUS

## 2019-12-05 MED ORDER — IOHEXOL 9 MG/ML PO SOLN
ORAL | Status: AC
  Filled 2019-12-05: qty 1000

## 2019-12-05 NOTE — Addendum Note (Signed)
Addended by: Cheron Every on: 12/05/2019 11:28 AM   Modules accepted: Orders

## 2019-12-05 NOTE — Telephone Encounter (Signed)
LMOVM for pt at cell# Called home # and was advised patient not available.

## 2019-12-05 NOTE — Telephone Encounter (Signed)
Received VM from [place] with [facility] Dr. Leighton Ruff office. Patient scheduled to see them 12/09/2019. They are requesting patient has a CT CHEST, ABD/PELVIS to complete work up prior to appointment or she will be rescheduled. RMR and AB are on PAL. Fowarding to [facility] to advise on Thanks!

## 2019-12-05 NOTE — Telephone Encounter (Signed)
Please schedule CT Chest/abd/pelvis with contrast for Dx: invasive colon cancer in ascending colon polyp She will need creatinine.

## 2019-12-05 NOTE — Telephone Encounter (Signed)
Pt was returning a call from [place]. [phone]

## 2019-12-05 NOTE — Telephone Encounter (Signed)
Patient returned call. She is aware CT chestabd/pelv is needed. She was agreeable.   Called CS and patient can go ahead and head over to radiology. She will have ISTAT done there for labs.  Called pt back and she is aware. Nothing further needed

## 2019-12-06 ENCOUNTER — Ambulatory Visit (HOSPITAL_COMMUNITY): Payer: Medicare Other

## 2019-12-06 ENCOUNTER — Other Ambulatory Visit: Payer: Self-pay | Admitting: *Deleted

## 2019-12-06 DIAGNOSIS — E041 Nontoxic single thyroid nodule: Secondary | ICD-10-CM

## 2019-12-06 LAB — SURGICAL PATHOLOGY

## 2019-12-09 DIAGNOSIS — C182 Malignant neoplasm of ascending colon: Secondary | ICD-10-CM | POA: Diagnosis not present

## 2019-12-10 ENCOUNTER — Encounter: Payer: Self-pay | Admitting: "Endocrinology

## 2019-12-10 ENCOUNTER — Encounter: Payer: Self-pay | Admitting: Family Medicine

## 2019-12-10 ENCOUNTER — Other Ambulatory Visit: Payer: Self-pay

## 2019-12-10 ENCOUNTER — Ambulatory Visit (INDEPENDENT_AMBULATORY_CARE_PROVIDER_SITE_OTHER): Payer: Medicare Other | Admitting: "Endocrinology

## 2019-12-10 VITALS — BP 124/62 | HR 68 | Ht 65.0 in | Wt 181.8 lb

## 2019-12-10 DIAGNOSIS — E049 Nontoxic goiter, unspecified: Secondary | ICD-10-CM | POA: Insufficient documentation

## 2019-12-10 NOTE — Progress Notes (Signed)
12/10/2019                          Endocrinology Consult note   Subjective:    Patient ID: Joanna Brown, female    DOB: 1941/05/08. She Patient is being seen today for a new issue with her thyroid.  She was found to have 2.6 cm nodule on right lobe of her thyroid while undergoing CT scan of the chest during colon cancer surveillance.  She is regularly following this clinic for management of type 2 diabetes, hyperlipidemia, hypertension.      PMD:   Fayrene Helper, MD  Past Medical History:  Diagnosis Date  . Blood transfusion    1980  . Diabetes mellitus   . Dysrhythmia   . Female bladder prolapse   . GERD 11/18/2008   Qualifier: Diagnosis of  By: Craige Cotta    . GERD (gastroesophageal reflux disease)   . Heart disease   . Hyperlipidemia   . Hypertension    echo and stress 4/10 reports on chart, EKG ` LOV 9/12 on chart  . Mild aortic stenosis    Past Surgical History:  Procedure Laterality Date  . ABDOMINAL HYSTERECTOMY    . ANTERIOR AND POSTERIOR REPAIR  04/26/2011   Procedure: ANTERIOR (CYSTOCELE) AND POSTERIOR REPAIR (RECTOCELE);  Surgeon: Reece Packer, MD;  Location: [facility];  Service: Urology;  Laterality: N/A;  . CATARACT EXTRACTION Bilateral    with IOL  . CHOLECYSTECTOMY  2009  . COLONOSCOPY N/A 12/02/2013   three colon polyps removed, small internal hemorrhoids. Hyperplastic polyps  . COLONOSCOPY N/A 11/20/2019   Procedure: COLONOSCOPY;  Surgeon: Daneil Dolin, MD;  Location: [facility];  Service: Endoscopy;  Laterality: N/A;  12:00pm  . COLONOSCOPY W/ POLYPECTOMY    . LEFT HEART CATH  09/10/2008   normal coronary arteries, normal LV systolic function, EF 62% (Dr. [name])  . LYMPH NODE DISSECTION Right 1997   under arm  . NM MYOCAR PERF WALL MOTION  2010   dipyridamole - mild-mod in intenstiy perfusion defect in mid anterior, mid anteroseptal wall, EF 70%  . OVARY SURGERY     bilateral tumors removed  . POLYPECTOMY  11/20/2019    Procedure: POLYPECTOMY;  Surgeon: Daneil Dolin, MD;  Location: [facility];  Service: Endoscopy;;  hot and cold snare cecal polyp, and asending polyps x 2  . THYROIDECTOMY    . THYROIDECTOMY  02/2008  . TRANSTHORACIC ECHOCARDIOGRAM  08/2011   EF=>55%, mild conc LVH; trace MR; mild TR; mild-mod AV calcification with mild valvular AV stenosis  . VAGINAL PROLAPSE REPAIR  04/26/2011   Procedure: VAGINAL VAULT SUSPENSION;  Surgeon: Reece Packer, MD;  Location: [facility];  Service: Urology;  Laterality: N/A;  with Graft  10x6   Social History   Socioeconomic History  . Marital status: Married    Spouse name: [name]  . Number of children: 1  . Years of education: Trade  . Highest education level: 12th grade  Occupational History  . Occupation: Retired  Tobacco Use  . Smoking status: Never Smoker  . Smokeless tobacco: Never Used  Vaping Use  . Vaping Use: Never used  Substance and Sexual Activity  . Alcohol use: No    Comment: socially- none x 30 years  . Drug use: No  . Sexual activity: Yes  Other Topics Concern  . Not on file  Social History Narrative   Patient lives  at home with spouse. 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 LIVED IN [place] FOR 50 YRS AND CAME BACK TO [place] ~2009. Caffeine Use: 1 cup of coffee daily. HAD ONE CHILD: PASSED 5 YRS. HAVE THREE GRAND-KIDS AND TWO GREAT GRANDS.    Social Determinants of Health   Financial Resource Strain: Low Risk   . Difficulty of Paying Living Expenses: Not hard at all  Food Insecurity:   . Worried About Charity fundraiser in the Last Year:   . Arboriculturist in the Last Year:   Transportation Needs: No Transportation Needs  . Lack of Transportation (Medical): No  . Lack of Transportation (Non-Medical): No  Physical Activity: Insufficiently Active  . Days of Exercise per Week: 3 days  . Minutes of Exercise per Session: 20 min  Stress: No Stress Concern Present  . Feeling of Stress : Only a little   Social Connections: Socially Integrated  . Frequency of Communication with Friends and Family: Twice a week  . Frequency of Social Gatherings with Friends and Family: Twice a week  . Attends Religious Services: More than 4 times per year  . Active Member of Clubs or Organizations: No  . Attends Archivist Meetings: 1 to 4 times per year  . Marital Status: Married   Outpatient Encounter Medications as of 12/10/2019  Medication Sig  . ALPHAGAN P 0.1 % SOLN Apply 1 drop to eye every morning.  Marland Kitchen amLODipine (NORVASC) 10 MG tablet TAKE 1 TABLET(10 MG) BY MOUTH EVERY MORNING (Patient taking differently: Take 10 mg by mouth daily. TAKE 1 TABLET(10 MG) BY MOUTH EVERY MORNING)  . Ascorbic Acid (VITAMIN C) 1000 MG tablet Take 1,000 mg by mouth 4 (four) times a week.  Marland Kitchen aspirin 81 MG tablet Take 81 mg by mouth every other day.   . B-D ULTRAFINE III SHORT PEN 31G X 8 MM MISC USE AS DIRECTED AT BEDTIME WITH INSULIN PENS  . benazepril (LOTENSIN) 40 MG tablet TAKE 1 TABLET(40 MG) BY MOUTH DAILY (Patient taking differently: Take 40 mg by mouth daily. )  . Blood Glucose Monitoring Suppl (ACCU-CHEK GUIDE) w/Device KIT 1 each by Does not apply route 4 (four) times daily.  . Calcium Carbonate-Vit D-Min (CALCIUM 1200) 1200-1000 MG-UNIT CHEW Chew 1 each by mouth 4 (four) times a week.   . cholecalciferol (VITAMIN D3) 25 MCG (1000 UT) tablet Take 1,000 Units by mouth daily.  Marland Kitchen ezetimibe (ZETIA) 10 MG tablet Take 1 tablet (10 mg total) by mouth daily.  Marland Kitchen glipiZIDE (GLUCOTROL XL) 5 MG 24 hr tablet TAKE 2 TABLET BY MOUTH DAILY WITH BREAKFAST (Patient taking differently: Take 10 mg by mouth daily with breakfast. TAKE 2 TABLET BY MOUTH DAILY WITH BREAKFAST)  . glucose blood (ACCU-CHEK GUIDE) test strip Use as instructed q.i.d  E11.65  . insulin detemir (LEVEMIR FLEXTOUCH) 100 UNIT/ML FlexPen Inject 80 Units into the skin at bedtime.  . Lancets Ultra Fine MISC 1 each by Does not apply route 4 (four) times daily.   Marland Kitchen latanoprost (XALATAN) 0.005 % ophthalmic solution Place 1 drop into both eyes at bedtime.   . metFORMIN (GLUCOPHAGE) 500 MG tablet TAKE 1 TABLET BY MOUTH EVERY DAY WITH BREAKFAST (Patient taking differently: Take 500 mg by mouth daily with breakfast. TAKE 1 TABLET BY MOUTH EVERY DAY WITH BREAKFAST)  . Multiple Vitamins-Minerals (CENTRUM) tablet Take 1 tablet by mouth every morning.   . Omega-3 Fatty Acids (FISH OIL) 1200 MG CAPS Take  1 capsule by mouth every morning.   . pravastatin (PRAVACHOL) 80 MG tablet TAKE 1 TABLET(80 MG) BY MOUTH DAILY  . triamterene-hydrochlorothiazide (MAXZIDE) 75-50 MG tablet TAKE 1 TABLET BY MOUTH DAILY   No facility-administered encounter medications on file as of 12/10/2019.   ALLERGIES: Allergies  Allergen Reactions  . Senokot Wheat Bran [Wheat Bran]     ABDOMINAL CRAMPS  . Spironolactone     Stomach problems, vision changes    VACCINATION STATUS: Immunization History  Administered Date(s) Administered  . Pneumococcal Conjugate-13 12/11/2013  . Pneumococcal Polysaccharide-23 01/13/2010  . Tdap 10/05/2010    Diabetes She presents for her follow-up diabetic visit. She has type 2 diabetes mellitus. Onset time: She was diagnosed at approximate age of 70 years. Her disease course has been improving. There are no hypoglycemic associated symptoms. Pertinent negatives for hypoglycemia include no confusion, headaches, pallor or seizures. Pertinent negatives for diabetes include no blurred vision, no chest pain, no fatigue, no polydipsia, no polyphagia and no polyuria. There are no hypoglycemic complications. Symptoms are improving. Diabetic complications include nephropathy and retinopathy. Risk factors for coronary artery disease include dyslipidemia, diabetes mellitus, obesity and sedentary lifestyle. Her weight is fluctuating minimally. She is following a generally unhealthy diet. When asked about meal planning, she reported none. She has had a previous visit  with a dietitian. She rarely participates in exercise. Her home blood glucose trend is increasing steadily. Her breakfast blood glucose range is generally 130-140 mg/dl. Her overall blood glucose range is 130-140 mg/dl. (She did not bring her logs nor meter to review.  She reports fasting blood glucose ranges between 80s-150.  Point of care A1c8.2% improving.) Eye exam is current.  Hyperlipidemia This is a chronic problem. The current episode started more than 1 year ago. The problem is uncontrolled. Exacerbating diseases include diabetes and obesity. Pertinent negatives include no chest pain, myalgias or shortness of breath. Current antihyperlipidemic treatment includes statins. Risk factors for coronary artery disease include dyslipidemia, diabetes mellitus, hypertension, obesity, a sedentary lifestyle and post-menopausal.  Hypertension This is a chronic problem. The current episode started more than 1 year ago. Pertinent negatives include no blurred vision, chest pain, headaches, palpitations or shortness of breath. Risk factors for coronary artery disease include dyslipidemia, diabetes mellitus, obesity and sedentary lifestyle. Hypertensive end-organ damage includes retinopathy.   Nodular goiter She Patient is being seen today for a new issue with her thyroid.  She was found to have 2.6 cm nodule on right lobe of her thyroid while undergoing CT scan of the chest during cancer surveillance.  She has previous history of left hemithyroidectomy approximately 10 years ago for multinodular goiter.  She is not on thyroid hormone supplement or replacement.  She denies dysphagia, shortness of breath, nor voice change.  Review of systems  Constitutional: + Minimally fluctuating body weight,   current  Body mass index is 30.25 kg/m. , no fatigue, no subjective hyperthermia, no subjective hypothermia Eyes: no blurry vision, no xerophthalmia ENT: no sore throat, no nodules palpated in throat, no  dysphagia/odynophagia, no hoarseness Cardiovascular: no Chest Pain, no Shortness of Breath, no palpitations, no leg swelling Respiratory: no cough, no shortness of breath Gastrointestinal: no Nausea/Vomiting/Diarhhea Musculoskeletal: no muscle/joint aches Skin: no rashes, no hyperemia Neurological: no tremors, no numbness, no tingling, no dizziness Psychiatric: no depression, no anxiety   Objective:    BP (!) 124/62   Pulse 68   Ht '5\' 5"'  (1.651 m)   Wt 181 lb 12.8 oz (82.5 kg)  BMI 30.25 kg/m   Wt Readings from Last 3 Encounters:  12/10/19 181 lb 12.8 oz (82.5 kg)  11/27/19 183 lb 12.8 oz (83.4 kg)  11/08/19 179 lb 6.4 oz (81.4 kg)    Physical Exam- Limited  Constitutional:  Body mass index is 30.25 kg/m. , not in acute distress, normal state of mind Eyes:  EOMI, no exophthalmos Neck: Supple Thyroid: + Palpable goiter Respiratory: Adequate breathing efforts Musculoskeletal: no gross deformities, strength intact in all four extremities, no gross restriction of joint movements Skin:  no rashes, no hyperemia Neurological: no tremor with outstretched hands,    CMP ( most recent) CMP     Component Value Date/Time   NA 140 07/30/2019 0718   NA 135 12/31/2018 0915   K 4.2 07/30/2019 0718   CL 101 07/30/2019 0718   CO2 31 07/30/2019 0718   GLUCOSE 191 (H) 07/30/2019 0718   BUN 18 07/30/2019 0718   BUN 13 12/31/2018 0915   CREATININE 1.30 (H) 12/05/2019 1628   CREATININE 0.96 (H) 07/30/2019 0718   CALCIUM 9.8 07/30/2019 0718   PROT 6.8 07/30/2019 0718   PROT 7.4 12/31/2018 0915   ALBUMIN 4.6 12/31/2018 0915   AST 25 07/30/2019 0718   ALT 25 07/30/2019 0718   ALKPHOS 57 12/31/2018 0915   BILITOT 0.3 07/30/2019 0718   BILITOT 0.2 12/31/2018 0915   GFRNONAA 57 (L) 07/30/2019 0718   GFRAA 66 07/30/2019 0718    Diabetic Labs (most recent): Lab Results  Component Value Date   HGBA1C 8.3 (A) 11/08/2019   HGBA1C 9.0 (A) 08/08/2019   HGBA1C 8.3 (A) 05/08/2019     Lipid Panel     Component Value Date/Time   CHOL 161 07/30/2019 0718   TRIG 179 (H) 07/30/2019 0718   HDL 34 (L) 07/30/2019 0718   CHOLHDL 4.7 07/30/2019 0718   VLDL 39 12/05/2017 0832   LDLCALC 99 07/30/2019 0718   Incidental finding on chest CT on December 05, 2019 Enlarged  multinodular remnant right thyroid with dominant 2.6 cm hypodense nodule   Assessment & Plan:     1.  Nodular goiter: Patient with remote past history of left hemithyroidectomy for multinodular goiter.  She did not require thyroid hormone supplement.  She was incidentally found to have 2.6 cm nodule in the right lobe. She will be sent for dedicated thyroid ultrasound to steady her thyroid anatomy better.  She will  be considered for fine-needle aspiration if suspicious nodules are identified.  2. Type 2 diabetes mellitus with stage 1 chronic kidney disease, with long-term current use of insulin ([place])  - Patient has currently uncontrolled symptomatic type 2 DM since  79 years of age. -She did not bring her logs nor meter to review.  She reports fasting blood glucose ranges between 80s-150.  Point of care A1c8.2% improving  9%. She does not report any hypoglycemia.  Recent labs reviewed, showing improving renal function.     Her diabetes is complicated by CKD obesity/sedentary life and patient remains at a high risk for more acute and chronic complications of diabetes which include CAD, CVA, CKD, retinopathy, and neuropathy. These are all discussed in detail with the patient.  - I have counseled the patient on diet management and weight loss, by adopting a carbohydrate restricted/protein rich diet.  -She still admits to dietary indiscretions including consumption of sweets and sweetened beverages.  - she  admits there is a room for improvement in her diet and drink choices. -  Suggestion  is made for her to avoid simple carbohydrates  from her diet including Cakes, Sweet Desserts / Pastries, Ice Cream, Soda  (diet and regular), Sweet Tea, Candies, Chips, Cookies, Sweet Pastries,  Store Bought Juices, Alcohol in Excess of  1-2 drinks a day, Artificial Sweeteners, Coffee Creamer, and "Sugar-free" Products. This will help patient to have stable blood glucose profile and potentially avoid unintended weight gain.   - I encouraged the patient to switch to  unprocessed or minimally processed complex starch and increased protein intake (animal or plant source), fruits, and vegetables.  - Patient is advised to stick to a routine mealtimes to eat 3 meals  a day and avoid unnecessary snacks ( to snack only to correct hypoglycemia).   - I have approached patient with the following individualized plan to manage diabetes and patient agrees:   -Based on her presenting glycemic profile, she will not need prandial insulin for now. -She is advised to continue Levemir 80 units nightly,   associated with monitoring of blood glucose twice a day-daily before breakfast and at bedtime.    -Patient is encouraged to call clinic for blood glucose levels less than 70 or above 200 mg /dl. -She is advised to to continue glipizide 10 mg XL p.o. daily at breakfast.   -She is advised to continue metformin  500 mg by mouth twice a day, therapeutically suitable for patient. -Patient is not a candidate for  SGLT2 inhibitors due to CKD. -If her next visit A1c remains above  9% on next visit, she will be reconsidered for  prandial insulin in addition to her basal insulin.  - Patient specific target  A1c;  LDL, HDL, Triglycerides, were discussed in detail.  3) BP/HTN:  Her blood pressure is controlled to target.   She is advised to continue her current blood pressure medications including benazepril 40 mg p.o. daily.   4) Lipids/HPL: Her recent lipid panel showed uncontrolled LDL at 97.  She is advised to continue pravastatin 80 mg p.o. nightly.         5)  Weight/Diet: Her BMI is 29.8 -she is a candidate for moderate weight loss.   CDE Consult has been initiated , exercise, and detailed carbohydrates information provided.  6) Chronic Care/Health Maintenance:  -Patient is on ACEI/ARB and Statin medications and encouraged to continue to follow up with Ophthalmology, Podiatrist at least yearly or according to recommendations, and advised to  stay away from smoking. I have recommended yearly flu vaccine and pneumonia vaccination at least every 5 years; moderate intensity exercise for up to 150 minutes weekly; and  sleep for at least 7 hours a day.   - I advised patient to maintain close follow up with Fayrene Helper, MD for primary care needs.     - Time spent on this patient care encounter:  20 minutes of which 50% was spent in  counseling and the rest reviewing  her current and  previous labs / studies and medications  doses and developing a plan for long term care. Derinda Late Rummell  participated in the discussions, expressed understanding, and voiced agreement with the above plans.  All questions were answered to her satisfaction. she is encouraged to contact clinic should she have any questions or concerns prior to her return visit.    Follow up plan: - Return in about 2 weeks (around 12/24/2019) for Thyroid / Neck Ultrasound.  Glade Lloyd, MD Phone: [phone]  Fax: [phone]   This note was partially dictated with  voice recognition software. Similar sounding words can be transcribed inadequately or may not  be corrected upon review.  12/10/2019, 1:01 PM

## 2019-12-11 ENCOUNTER — Telehealth: Payer: Self-pay | Admitting: Emergency Medicine

## 2019-12-11 NOTE — Telephone Encounter (Signed)
CEA results placed in provider box

## 2019-12-17 ENCOUNTER — Telehealth: Payer: Self-pay | Admitting: "Endocrinology

## 2019-12-17 NOTE — Telephone Encounter (Signed)
Pt said that Forestine Na called her for an ultrasound and she wants to know why and what is she suppose to be doing before her appt on thursday

## 2019-12-17 NOTE — Telephone Encounter (Signed)
Returned call to pt, questions answered.

## 2019-12-19 ENCOUNTER — Other Ambulatory Visit: Payer: Self-pay

## 2019-12-19 ENCOUNTER — Ambulatory Visit (HOSPITAL_COMMUNITY)
Admission: RE | Admit: 2019-12-19 | Discharge: 2019-12-19 | Disposition: A | Discharge status: Home / Self Care | Payer: Medicare Other | Source: Ambulatory Visit | Attending: "Endocrinology | Admitting: "Endocrinology

## 2019-12-19 DIAGNOSIS — E049 Nontoxic goiter, unspecified: Secondary | ICD-10-CM | POA: Insufficient documentation

## 2019-12-19 DIAGNOSIS — E042 Nontoxic multinodular goiter: Secondary | ICD-10-CM | POA: Diagnosis not present

## 2019-12-23 ENCOUNTER — Encounter: Payer: Self-pay | Admitting: Gastroenterology

## 2019-12-23 NOTE — Progress Notes (Signed)
Received consult note from Dr. Marcello Moores with [facility]. Moderate risk for surgery. Recommending repeat colonoscopy in 6-12 months for re-evaluation on ascending colon (superficially invasive adenocarcinoma found arising in background of sessile serrated polyp). Not tattooed. See full note scanned.

## 2019-12-26 ENCOUNTER — Ambulatory Visit (INDEPENDENT_AMBULATORY_CARE_PROVIDER_SITE_OTHER): Payer: Medicare Other | Admitting: "Endocrinology

## 2019-12-26 ENCOUNTER — Other Ambulatory Visit: Payer: Self-pay

## 2019-12-26 ENCOUNTER — Other Ambulatory Visit: Payer: Self-pay | Admitting: "Endocrinology

## 2019-12-26 ENCOUNTER — Encounter: Payer: Self-pay | Admitting: "Endocrinology

## 2019-12-26 VITALS — BP 115/66 | HR 68 | Ht 65.0 in | Wt 179.2 lb

## 2019-12-26 DIAGNOSIS — N181 Chronic kidney disease, stage 1: Secondary | ICD-10-CM | POA: Diagnosis not present

## 2019-12-26 DIAGNOSIS — Z794 Long term (current) use of insulin: Secondary | ICD-10-CM

## 2019-12-26 DIAGNOSIS — E049 Nontoxic goiter, unspecified: Secondary | ICD-10-CM

## 2019-12-26 DIAGNOSIS — E1122 Type 2 diabetes mellitus with diabetic chronic kidney disease: Secondary | ICD-10-CM

## 2019-12-26 NOTE — Patient Instructions (Signed)

## 2019-12-26 NOTE — Progress Notes (Signed)
12/26/2019                          Endocrinology Consult note   Subjective:    Patient ID: Joanna Brown, female    DOB: December 04, 1940. She Patient is being seen today for a new issue with her thyroid.  She was found to have 2.6 cm nodule on right lobe of her thyroid while undergoing CT scan of the chest during colon cancer surveillance.  She is regularly following this clinic for management of type 2 diabetes, hyperlipidemia, hypertension.      PMD:   Fayrene Helper, MD  Past Medical History:  Diagnosis Date  . Blood transfusion    1980  . Diabetes mellitus   . Dysrhythmia   . Female bladder prolapse   . GERD 11/18/2008   Qualifier: Diagnosis of  By: Craige Cotta    . GERD (gastroesophageal reflux disease)   . Heart disease   . Hyperlipidemia   . Hypertension    echo and stress 4/10 reports on chart, EKG ` LOV 9/12 on chart  . Mild aortic stenosis    Past Surgical History:  Procedure Laterality Date  . ABDOMINAL HYSTERECTOMY    . ANTERIOR AND POSTERIOR REPAIR  04/26/2011   Procedure: ANTERIOR (CYSTOCELE) AND POSTERIOR REPAIR (RECTOCELE);  Surgeon: Reece Packer, MD;  Location: [facility];  Service: Urology;  Laterality: N/A;  . CATARACT EXTRACTION Bilateral    with IOL  . CHOLECYSTECTOMY  2009  . COLONOSCOPY N/A 12/02/2013   three colon polyps removed, small internal hemorrhoids. Hyperplastic polyps  . COLONOSCOPY N/A 11/20/2019   Procedure: COLONOSCOPY;  Surgeon: Daneil Dolin, MD;  Location: [facility];  Service: Endoscopy;  Laterality: N/A;  12:00pm  . COLONOSCOPY W/ POLYPECTOMY    . LEFT HEART CATH  09/10/2008   normal coronary arteries, normal LV systolic function, EF 02% (Dr. [name])  . LYMPH NODE DISSECTION Right 1997   under arm  . NM MYOCAR PERF WALL MOTION  2010   dipyridamole - mild-mod in intenstiy perfusion defect in mid anterior, mid anteroseptal wall, EF 70%  . OVARY SURGERY     bilateral tumors removed  . POLYPECTOMY  11/20/2019    Procedure: POLYPECTOMY;  Surgeon: Daneil Dolin, MD;  Location: [facility];  Service: Endoscopy;;  hot and cold snare cecal polyp, and asending polyps x 2  . THYROIDECTOMY    . THYROIDECTOMY  02/2008  . TRANSTHORACIC ECHOCARDIOGRAM  08/2011   EF=>55%, mild conc LVH; trace MR; mild TR; mild-mod AV calcification with mild valvular AV stenosis  . VAGINAL PROLAPSE REPAIR  04/26/2011   Procedure: VAGINAL VAULT SUSPENSION;  Surgeon: Reece Packer, MD;  Location: [facility];  Service: Urology;  Laterality: N/A;  with Graft  10x6   Social History   Socioeconomic History  . Marital status: Married    Spouse name: [name]  . Number of children: 1  . Years of education: Trade  . Highest education level: 12th grade  Occupational History  . Occupation: Retired  Tobacco Use  . Smoking status: Never Smoker  . Smokeless tobacco: Never Used  Vaping Use  . Vaping Use: Never used  Substance and Sexual Activity  . Alcohol use: No    Comment: socially- none x 30 years  . Drug use: No  . Sexual activity: Yes  Other Topics Concern  . Not on file  Social History Narrative   Patient lives  at home with spouse. 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 LIVED IN [place] FOR 50 YRS AND CAME BACK TO [place] ~2009. Caffeine Use: 1 cup of coffee daily. HAD ONE CHILD: PASSED 5 YRS. HAVE THREE GRAND-KIDS AND TWO GREAT GRANDS.    Social Determinants of Health   Financial Resource Strain: Low Risk   . Difficulty of Paying Living Expenses: Not hard at all  Food Insecurity:   . Worried About Charity fundraiser in the Last Year:   . Arboriculturist in the Last Year:   Transportation Needs: No Transportation Needs  . Lack of Transportation (Medical): No  . Lack of Transportation (Non-Medical): No  Physical Activity: Insufficiently Active  . Days of Exercise per Week: 3 days  . Minutes of Exercise per Session: 20 min  Stress: No Stress Concern Present  . Feeling of Stress : Only a little   Social Connections: Socially Integrated  . Frequency of Communication with Friends and Family: Twice a week  . Frequency of Social Gatherings with Friends and Family: Twice a week  . Attends Religious Services: More than 4 times per year  . Active Member of Clubs or Organizations: No  . Attends Archivist Meetings: 1 to 4 times per year  . Marital Status: Married   Outpatient Encounter Medications as of 12/26/2019  Medication Sig  . ALPHAGAN P 0.1 % SOLN Apply 1 drop to eye every morning.  Joanna Brown Kitchen amLODipine (NORVASC) 10 MG tablet TAKE 1 TABLET(10 MG) BY MOUTH EVERY MORNING (Patient taking differently: Take 10 mg by mouth daily. TAKE 1 TABLET(10 MG) BY MOUTH EVERY MORNING)  . Ascorbic Acid (VITAMIN C) 1000 MG tablet Take 1,000 mg by mouth 4 (four) times a week.  Joanna Brown Kitchen aspirin 81 MG tablet Take 81 mg by mouth every other day.   . B-D ULTRAFINE III SHORT PEN 31G X 8 MM MISC USE AS DIRECTED AT BEDTIME WITH INSULIN PENS  . benazepril (LOTENSIN) 40 MG tablet TAKE 1 TABLET(40 MG) BY MOUTH DAILY (Patient taking differently: Take 40 mg by mouth daily. )  . Blood Glucose Monitoring Suppl (ACCU-CHEK GUIDE) w/Device KIT 1 each by Does not apply route 4 (four) times daily.  . Calcium Carbonate-Vit D-Min (CALCIUM 1200) 1200-1000 MG-UNIT CHEW Chew 1 each by mouth 4 (four) times a week.   . cholecalciferol (VITAMIN D3) 25 MCG (1000 UT) tablet Take 1,000 Units by mouth daily.  Joanna Brown Kitchen ezetimibe (ZETIA) 10 MG tablet Take 1 tablet (10 mg total) by mouth daily.  Joanna Brown Kitchen glipiZIDE (GLUCOTROL XL) 5 MG 24 hr tablet TAKE 2 TABLET BY MOUTH DAILY WITH BREAKFAST (Patient taking differently: Take 10 mg by mouth daily with breakfast. TAKE 2 TABLET BY MOUTH DAILY WITH BREAKFAST)  . glucose blood (ACCU-CHEK GUIDE) test strip USE TO CHECK BLOOD SUGAR TWO TIMES DAILY  . insulin detemir (LEVEMIR FLEXTOUCH) 100 UNIT/ML FlexPen Inject 80 Units into the skin at bedtime.  . Lancets Ultra Fine MISC 1 each by Does not apply route 4 (four)  times daily.  Joanna Brown Kitchen latanoprost (XALATAN) 0.005 % ophthalmic solution Place 1 drop into both eyes at bedtime.   . metFORMIN (GLUCOPHAGE) 500 MG tablet TAKE 1 TABLET BY MOUTH EVERY DAY WITH BREAKFAST (Patient taking differently: Take 500 mg by mouth daily with breakfast. TAKE 1 TABLET BY MOUTH EVERY DAY WITH BREAKFAST)  . Multiple Vitamins-Minerals (CENTRUM) tablet Take 1 tablet by mouth every morning.   . Omega-3 Fatty Acids (FISH OIL) 1200 MG  CAPS Take 1 capsule by mouth every morning.   . pravastatin (PRAVACHOL) 80 MG tablet TAKE 1 TABLET(80 MG) BY MOUTH DAILY  . triamterene-hydrochlorothiazide (MAXZIDE) 75-50 MG tablet TAKE 1 TABLET BY MOUTH DAILY   No facility-administered encounter medications on file as of 12/26/2019.   ALLERGIES: Allergies  Allergen Reactions  . Senokot Wheat Bran [Wheat Bran]     ABDOMINAL CRAMPS  . Spironolactone     Stomach problems, vision changes    VACCINATION STATUS: Immunization History  Administered Date(s) Administered  . Pneumococcal Conjugate-13 12/11/2013  . Pneumococcal Polysaccharide-23 01/13/2010  . Tdap 10/05/2010    Diabetes She presents for her follow-up diabetic visit. She has type 2 diabetes mellitus. Onset time: She was diagnosed at approximate age of 89 years. Her disease course has been stable. There are no hypoglycemic associated symptoms. Pertinent negatives for hypoglycemia include no confusion, headaches, pallor or seizures. Pertinent negatives for diabetes include no blurred vision, no chest pain, no fatigue, no polydipsia, no polyphagia and no polyuria. There are no hypoglycemic complications. Symptoms are stable. Diabetic complications include nephropathy and retinopathy. Risk factors for coronary artery disease include dyslipidemia, diabetes mellitus, obesity and sedentary lifestyle. Her weight is fluctuating minimally. She is following a generally unhealthy diet. When asked about meal planning, she reported none. She has had a previous  visit with a dietitian. She rarely participates in exercise. Her home blood glucose trend is increasing steadily. Her breakfast blood glucose range is generally 110-130 mg/dl. Her overall blood glucose range is 140-180 mg/dl. (Her recent A1c was 8.3%, presents with controlled fasting glycemic profile, slightly above target postprandial readings.  No hypoglycemia.   ) Eye exam is current.  Hyperlipidemia This is a chronic problem. The current episode started more than 1 year ago. The problem is uncontrolled. Exacerbating diseases include diabetes and obesity. Pertinent negatives include no chest pain, myalgias or shortness of breath. Current antihyperlipidemic treatment includes statins. Risk factors for coronary artery disease include dyslipidemia, diabetes mellitus, hypertension, obesity, a sedentary lifestyle and post-menopausal.  Hypertension This is a chronic problem. The current episode started more than 1 year ago. Pertinent negatives include no blurred vision, chest pain, headaches, palpitations or shortness of breath. Risk factors for coronary artery disease include dyslipidemia, diabetes mellitus, obesity and sedentary lifestyle. Hypertensive end-organ damage includes retinopathy.   Nodular goiter She Patient is being seen today for a new issue with her thyroid.  She was found to have 2.6 cm nodule on right lobe of her thyroid while undergoing CT scan of the chest during cancer surveillance.  She has previous history of left hemithyroidectomy approximately 10 years ago for multinodular goiter.  She is not on thyroid hormone supplement or replacement.  She denies dysphagia, shortness of breath, nor voice change. -Her subsequent thyroid ultrasound confirms multinodular right lobe of the thyroid with surgically absent left lobe.  Review of systems  Constitutional: + Minimally fluctuating body weight,   current  Body mass index is 29.82 kg/m. , no fatigue, no subjective hyperthermia, no subjective  hypothermia Eyes: no blurry vision, no xerophthalmia ENT: no sore throat, no nodules palpated in throat, no dysphagia/odynophagia, no hoarseness Cardiovascular: no Chest Pain, no Shortness of Breath, no palpitations, no leg swelling Respiratory: no cough, no shortness of breath Gastrointestinal: no Nausea/Vomiting/Diarhhea Musculoskeletal: no muscle/joint aches Skin: no rashes, no hyperemia Neurological: no tremors, no numbness, no tingling, no dizziness Psychiatric: no depression, no anxiety   Objective:    BP 115/66   Pulse 68   Ht  _0  (1.651 m)   Wt 179 lb 3.2 oz (81.3 kg)   BMI 29.82 kg/m   Wt Readings from Last 3 Encounters:  12/26/19 179 lb 3.2 oz (81.3 kg)  12/10/19 181 lb 12.8 oz (82.5 kg)  11/27/19 183 lb 12.8 oz (83.4 kg)    Physical Exam- Limited  Constitutional:  Body mass index is 29.82 kg/m. , not in acute distress, normal state of mind Eyes:  EOMI, no exophthalmos Neck: Supple Thyroid: + Palpable goiter Respiratory: Adequate breathing efforts Musculoskeletal: no gross deformities, strength intact in all four extremities, no gross restriction of joint movements Skin:  no rashes, no hyperemia Neurological: no tremor with outstretched hands,    CMP ( most recent) CMP     Component Value Date/Time   NA 140 07/30/2019 0718   NA 135 12/31/2018 0915   K 4.2 07/30/2019 0718   CL 101 07/30/2019 0718   CO2 31 07/30/2019 0718   GLUCOSE 191 (H) 07/30/2019 0718   BUN 18 07/30/2019 0718   BUN 13 12/31/2018 0915   CREATININE 1.30 (H) 12/05/2019 1628   CREATININE 0.96 (H) 07/30/2019 0718   CALCIUM 9.8 07/30/2019 0718   PROT 6.8 07/30/2019 0718   PROT 7.4 12/31/2018 0915   ALBUMIN 4.6 12/31/2018 0915   AST 25 07/30/2019 0718   ALT 25 07/30/2019 0718   ALKPHOS 57 12/31/2018 0915   BILITOT 0.3 07/30/2019 0718   BILITOT 0.2 12/31/2018 0915   GFRNONAA 57 (L) 07/30/2019 0718   GFRAA 66 07/30/2019 0718    Diabetic Labs (most recent): Lab Results  Component  Value Date   HGBA1C 8.3 (A) 11/08/2019   HGBA1C 9.0 (A) 08/08/2019   HGBA1C 8.3 (A) 05/08/2019    Lipid Panel     Component Value Date/Time   CHOL 161 07/30/2019 0718   TRIG 179 (H) 07/30/2019 0718   HDL 34 (L) 07/30/2019 0718   CHOLHDL 4.7 07/30/2019 0718   VLDL 39 12/05/2017 0832   LDLCALC 99 07/30/2019 0718   Incidental finding on chest CT on December 05, 2019 Enlarged  multinodular remnant right thyroid with dominant 2.6 cm hypodense Nodule.   Thyroid ultrasound on December 19, 2019: Right lobe 4.4 cm, left lobe absent surgically. No adenopathy  IMPRESSION: Surgical changes of left hemithyroidectomy. Multinodular thyroid. 3.9 cm and 1.6 cm cystic/almost completely cystic nodules with smooth margins, No thyroid nodule meets criteria for biopsy or surveillance, as designated by the newly established ACR TI-RADS criteria.   Assessment & Plan:     1.  Nodular goiter: Patient with remote past history of left hemithyroidectomy for multinodular goiter.  She did not require thyroid hormone supplement.  She was incidentally found to have 2.6 cm nodule in the right lobe. -Her subsequent dedicated thyroid ultrasound confirmed 2 nodules on the right lobe of her thyroid which did not meet any criteria for biopsy. -She would not need any antithyroid intervention at this time, TFTs are consistent with euthyroid state.  She will be considered for repeat ultrasound in 1 year.  2. Type 2 diabetes mellitus with stage 1 chronic kidney disease, with long-term current use of insulin ([place])  - Patient did bring her logs and meter to review. Her recent A1c was 8.3%, presents with controlled fasting glycemic profile, slightly above target postprandial readings.  No hypoglycemia.    Recent labs reviewed, showing improving renal function.     Her diabetes is complicated by CKD obesity/sedentary life and patient remains at a high risk for more  acute and chronic complications of diabetes which  include CAD, CVA, CKD, retinopathy, and neuropathy. These are all discussed in detail with the patient.  - I have counseled the patient on diet management and weight loss, by adopting a carbohydrate restricted/protein rich diet.  -She still admits to dietary indiscretions including consumption of sweets and sweetened beverages. - she  admits there is a room for improvement in her diet and drink choices. -  Suggestion is made for her to avoid simple carbohydrates  from her diet including Cakes, Sweet Desserts / Pastries, Ice Cream, Soda (diet and regular), Sweet Tea, Candies, Chips, Cookies, Sweet Pastries,  Store Bought Juices, Alcohol in Excess of  1-2 drinks a day, Artificial Sweeteners, Coffee Creamer, and "Sugar-free" Products. This will help patient to have stable blood glucose profile and potentially avoid unintended weight gain.  - I encouraged the patient to switch to  unprocessed or minimally processed complex starch and increased protein intake (animal or plant source), fruits, and vegetables.  - Patient is advised to stick to a routine mealtimes to eat 3 meals  a day and avoid unnecessary snacks ( to snack only to correct hypoglycemia).   - I have approached patient with the following individualized plan to manage diabetes and patient agrees:   -In light of her presentation with near target glycemic profile, she would not need prandial insulin for now.    -She is advised to continue Levemir 80 units nightly,   associated with monitoring of blood glucose twice a day-daily before breakfast and at bedtime.    -Patient is encouraged to call clinic for blood glucose levels less than 70 or above 200 mg /dl. -She is advised to to continue glipizide 10 mg XL p.o. daily at breakfast.   -She is advised to continue metformin  500 mg by mouth twice a day, therapeutically suitable for patient. -Patient is not a candidate for  SGLT2 inhibitors due to CKD. -If her next visit A1c remains above  9%  on next visit, she will be reconsidered for  prandial insulin in addition to her basal insulin.  - Patient specific target  A1c;  LDL, HDL, Triglycerides, were discussed in detail.  3) BP/HTN:  Her blood pressure is controlled to target.   She is advised to continue her current blood pressure medications including benazepril 40 mg p.o. daily.   4) Lipids/HPL: Her recent lipid panel showed uncontrolled LDL at 97.  She is advised to continue pravastatin 80 mg p.o. nightly.         5)  Weight/Diet: Her BMI is 29.8 -she is a candidate for moderate weight loss.  CDE Consult has been initiated , exercise, and detailed carbohydrates information provided.  6) Chronic Care/Health Maintenance:  -Patient is on ACEI/ARB and Statin medications and encouraged to continue to follow up with Ophthalmology, Podiatrist at least yearly or according to recommendations, and advised to  stay away from smoking. I have recommended yearly flu vaccine and pneumonia vaccination at least every 5 years; moderate intensity exercise for up to 150 minutes weekly; and  sleep for at least 7 hours a day.   - I advised patient to maintain close follow up with Fayrene Helper, MD for primary care needs.  - Time spent on this patient care encounter:  35 min, of which > 50% was spent in  counseling and the rest reviewing her blood glucose logs , discussing her hypoglycemia and hyperglycemia episodes, reviewing her current and  previous labs / studies  (  including abstraction from other facilities) and medications  doses and developing a  long term treatment plan and documenting her care.   Please refer to Patient Instructions for Blood Glucose Monitoring and Insulin/Medications Dosing Guide"  in media tab for additional information. Please  also refer to " Patient Self Inventory" in the Media  tab for reviewed elements of pertinent patient history.  Derinda Late Zelenak participated in the discussions, expressed understanding, and  voiced agreement with the above plans.  All questions were answered to her satisfaction. she is encouraged to contact clinic should she have any questions or concerns prior to her return visit.    Follow up plan: - Return in about 3 months (around 03/27/2020) for F/U with Pre-visit Labs, Meter, Logs, A1c here.Glade Lloyd, MD Phone: [phone]  Fax: [phone]   This note was partially dictated with voice recognition software. Similar sounding words can be transcribed inadequately or may not  be corrected upon review.  12/26/2019, 2:41 PM

## 2020-01-02 ENCOUNTER — Other Ambulatory Visit: Payer: Self-pay

## 2020-01-02 DIAGNOSIS — E782 Mixed hyperlipidemia: Secondary | ICD-10-CM

## 2020-01-02 DIAGNOSIS — Z794 Long term (current) use of insulin: Secondary | ICD-10-CM

## 2020-01-02 DIAGNOSIS — N181 Chronic kidney disease, stage 1: Secondary | ICD-10-CM

## 2020-01-02 DIAGNOSIS — E1122 Type 2 diabetes mellitus with diabetic chronic kidney disease: Secondary | ICD-10-CM

## 2020-01-02 DIAGNOSIS — E049 Nontoxic goiter, unspecified: Secondary | ICD-10-CM

## 2020-01-03 ENCOUNTER — Other Ambulatory Visit: Payer: Self-pay | Admitting: "Endocrinology

## 2020-01-08 ENCOUNTER — Encounter: Payer: Self-pay | Admitting: Podiatry

## 2020-01-08 ENCOUNTER — Other Ambulatory Visit: Payer: Self-pay

## 2020-01-08 ENCOUNTER — Ambulatory Visit (INDEPENDENT_AMBULATORY_CARE_PROVIDER_SITE_OTHER): Payer: Medicare Other | Admitting: Podiatry

## 2020-01-08 DIAGNOSIS — E1142 Type 2 diabetes mellitus with diabetic polyneuropathy: Secondary | ICD-10-CM | POA: Diagnosis not present

## 2020-01-08 DIAGNOSIS — M2011 Hallux valgus (acquired), right foot: Secondary | ICD-10-CM

## 2020-01-08 DIAGNOSIS — M79675 Pain in left toe(s): Secondary | ICD-10-CM | POA: Diagnosis not present

## 2020-01-08 DIAGNOSIS — M2012 Hallux valgus (acquired), left foot: Secondary | ICD-10-CM

## 2020-01-08 DIAGNOSIS — M79674 Pain in right toe(s): Secondary | ICD-10-CM | POA: Diagnosis not present

## 2020-01-08 DIAGNOSIS — B351 Tinea unguium: Secondary | ICD-10-CM

## 2020-01-08 NOTE — Progress Notes (Signed)
This patient returns to my office for at risk foot care.  This patient requires this care by a professional since this patient will be at risk due to having diabetes and kidney disease.    This patient is unable to cut nails herself since the patient cannot reach her nails.These nails are painful walking and wearing shoes.  This patient presents for at risk foot care today.  General Appearance  Alert, conversant and in no acute stress.  Vascular  Dorsalis pedis and posterior tibial  pulses are palpable  bilaterally.  Capillary return is within normal limits  bilaterally. Temperature is within normal limits  bilaterally.  Neurologic  Senn-Weinstein monofilament wire test within normal limits  bilaterally. Muscle power within normal limits bilaterally.  Nails Thick disfigured discolored nails with subungual debris  from hallux to fifth toes bilaterally. No evidence of bacterial infection or drainage bilaterally.  Orthopedic  No limitations of motion  feet .  No crepitus or effusions noted.  No bony pathology or digital deformities noted.  Mild  HAV  B/L.  Skin  normotropic skin with no porokeratosis noted bilaterally.  No signs of infections or ulcers noted.     Onychomycosis  Pain in right toe  Pain in left toe.  Consent was obtained for treatment procedures.  Debridement and grinding of long thick nails with clearing of subungual debris with nail nipper followed by dremel tool..  No infection or ulcer.     Return office visit   10 weeks        Told patient to return for periodic foot care and evaluation due to potential at risk complications.   Keylen Uzelac DPM  

## 2020-01-09 ENCOUNTER — Telehealth: Payer: Self-pay | Admitting: Internal Medicine

## 2020-01-09 NOTE — Telephone Encounter (Signed)
Pt called wanting to speak with Roseanne Kaufman, NP regarding her colonoscopy she had done back in July by Dr Gala Romney. [phone]. She said she didn't have any problems, only questions.

## 2020-01-10 ENCOUNTER — Ambulatory Visit: Payer: Medicare Other | Admitting: Gastroenterology

## 2020-01-10 NOTE — Telephone Encounter (Signed)
Spoke with Joanna Brown. Joanna Brown has a few questions dealing with her TCS that she had done. She wants to know if there is any medication that slow the cancer down until she can see her cardiologist. Joanna Brown saw the surgeon and was told that it wasn't marked how far she should cut and the surgeon couldn't do the surgery. Joanna Brown wants to know her options, is another TCS needed for Dr. Gala Romney to mark for the surgeon to do a removal? Joanna Brown was very pleasant on the telephone.

## 2020-01-13 NOTE — Telephone Encounter (Signed)
[facility] clinical pool: is there a way we can hold a date in November if the schedule is out yet? She will need early interval colonoscopy. I spoke with patient personally and answered questions. Will see her in Oct 2021.

## 2020-01-13 NOTE — Telephone Encounter (Signed)
Unable to hold a date for November (schedule isn't available yet).

## 2020-01-18 ENCOUNTER — Other Ambulatory Visit: Payer: Self-pay | Admitting: General Practice

## 2020-01-28 ENCOUNTER — Ambulatory Visit: Payer: Medicare Other | Admitting: Internal Medicine

## 2020-02-10 ENCOUNTER — Telehealth: Payer: Self-pay | Admitting: "Endocrinology

## 2020-02-10 ENCOUNTER — Other Ambulatory Visit: Payer: Self-pay

## 2020-02-10 DIAGNOSIS — N181 Chronic kidney disease, stage 1: Secondary | ICD-10-CM

## 2020-02-10 DIAGNOSIS — Z794 Long term (current) use of insulin: Secondary | ICD-10-CM

## 2020-02-10 DIAGNOSIS — E1122 Type 2 diabetes mellitus with diabetic chronic kidney disease: Secondary | ICD-10-CM

## 2020-02-10 MED ORDER — BD PEN NEEDLE SHORT U/F 31G X 8 MM MISC: 3 refills | Status: DC

## 2020-02-10 MED ORDER — LANCETS ULTRA FINE MISC: 1.0000 | Freq: Four times a day (QID) | 5 refills | Status: DC

## 2020-02-10 NOTE — Telephone Encounter (Signed)
Pt would like nurse to call back to discuss her insulin options for quick pen and about refills she is needing. I could not understand exactly what patient is requesting

## 2020-02-10 NOTE — Telephone Encounter (Signed)
Returned call to patient, needed scripts sent in for lancets and pen needles. Sent in

## 2020-02-17 ENCOUNTER — Telehealth: Payer: Self-pay | Admitting: Internal Medicine

## 2020-02-17 NOTE — Telephone Encounter (Signed)
Recall mailed 

## 2020-02-17 NOTE — Telephone Encounter (Signed)
RECALL FOR CT  °

## 2020-02-22 ENCOUNTER — Other Ambulatory Visit: Payer: Self-pay | Admitting: Family Medicine

## 2020-02-24 ENCOUNTER — Other Ambulatory Visit: Payer: Self-pay | Admitting: Family Medicine

## 2020-02-24 ENCOUNTER — Other Ambulatory Visit: Payer: Self-pay | Admitting: "Endocrinology

## 2020-02-27 ENCOUNTER — Telehealth: Payer: Self-pay | Admitting: Internal Medicine

## 2020-02-27 DIAGNOSIS — C189 Malignant neoplasm of colon, unspecified: Secondary | ICD-10-CM

## 2020-02-27 DIAGNOSIS — R911 Solitary pulmonary nodule: Secondary | ICD-10-CM

## 2020-02-27 NOTE — Telephone Encounter (Signed)
Pt received letter that it was time to schedule her CT. Please call [phone]

## 2020-02-27 NOTE — Telephone Encounter (Signed)
Called pt and provided appt for CT chest/abd/pelvis. She voiced understanding and had no questions. Aware she needs to p/u oral contrast at least few days prior.

## 2020-03-02 DIAGNOSIS — E049 Nontoxic goiter, unspecified: Secondary | ICD-10-CM | POA: Diagnosis not present

## 2020-03-02 DIAGNOSIS — E782 Mixed hyperlipidemia: Secondary | ICD-10-CM | POA: Diagnosis not present

## 2020-03-02 DIAGNOSIS — Z794 Long term (current) use of insulin: Secondary | ICD-10-CM | POA: Diagnosis not present

## 2020-03-02 DIAGNOSIS — E1122 Type 2 diabetes mellitus with diabetic chronic kidney disease: Secondary | ICD-10-CM | POA: Diagnosis not present

## 2020-03-02 DIAGNOSIS — N181 Chronic kidney disease, stage 1: Secondary | ICD-10-CM | POA: Diagnosis not present

## 2020-03-03 LAB — COMPREHENSIVE METABOLIC PANEL
AG Ratio: 1.4 (calc) (ref 1.0–2.5)
ALT: 23 U/L (ref 6–29)
AST: 27 U/L (ref 10–35)
Albumin: 4.2 g/dL (ref 3.6–5.1)
Alkaline phosphatase (APISO): 44 U/L (ref 37–153)
BUN/Creatinine Ratio: 14 (calc) (ref 6–22)
BUN: 16 mg/dL (ref 7–25)
CO2: 29 mmol/L (ref 20–32)
Calcium: 10.3 mg/dL (ref 8.6–10.4)
Chloride: 98 mmol/L (ref 98–110)
Creat: 1.11 mg/dL — ABNORMAL HIGH (ref 0.60–0.93)
Globulin: 3.1 g/dL (calc) (ref 1.9–3.7)
Glucose, Bld: 140 mg/dL — ABNORMAL HIGH (ref 65–139)
Potassium: 4.2 mmol/L (ref 3.5–5.3)
Sodium: 137 mmol/L (ref 135–146)
Total Bilirubin: 0.5 mg/dL (ref 0.2–1.2)
Total Protein: 7.3 g/dL (ref 6.1–8.1)

## 2020-03-03 LAB — TSH: TSH: 2.9 mIU/L (ref 0.40–4.50)

## 2020-03-03 LAB — T4, FREE: Free T4: 1.2 ng/dL (ref 0.8–1.8)

## 2020-03-04 ENCOUNTER — Encounter: Payer: Self-pay | Admitting: Gastroenterology

## 2020-03-04 ENCOUNTER — Ambulatory Visit (INDEPENDENT_AMBULATORY_CARE_PROVIDER_SITE_OTHER): Payer: Medicare Other | Admitting: Gastroenterology

## 2020-03-04 ENCOUNTER — Telehealth: Payer: Self-pay | Admitting: *Deleted

## 2020-03-04 ENCOUNTER — Other Ambulatory Visit: Payer: Self-pay

## 2020-03-04 VITALS — BP 120/74 | HR 80 | Temp 97.8°F | Ht 65.0 in | Wt 181.0 lb

## 2020-03-04 DIAGNOSIS — K5900 Constipation, unspecified: Secondary | ICD-10-CM | POA: Diagnosis not present

## 2020-03-04 DIAGNOSIS — R1310 Dysphagia, unspecified: Secondary | ICD-10-CM | POA: Insufficient documentation

## 2020-03-04 DIAGNOSIS — K219 Gastro-esophageal reflux disease without esophagitis: Secondary | ICD-10-CM | POA: Insufficient documentation

## 2020-03-04 DIAGNOSIS — Z85038 Personal history of other malignant neoplasm of large intestine: Secondary | ICD-10-CM

## 2020-03-04 MED ORDER — PANTOPRAZOLE SODIUM 40 MG PO TBEC: 40.0000 mg | DELAYED_RELEASE_TABLET | Freq: Every day | ORAL | 3 refills | Status: DC

## 2020-03-04 NOTE — Progress Notes (Signed)
Referring Provider: Fayrene Helper, MD Primary Care Physician:  Fayrene Helper, MD Primary GI: [facility]   Chief Complaint  Patient presents with  . Follow-up    doing ok    HPI:   Joanna Brown is a 79 y.o. female presenting today in follow-up after colonoscopy earlier this year with a superficial invasive adenocarcinoma arising in background of a sessile serrated polyp in ascending colon. CEA 2.4 in July 2021. Early interval colonoscopy recommended with tattoing if growth noted.   CT chest/abd/pelvis on file from July 2021 with repeat CT Chest/abdomen/pelvis to be completed in Nov 2021 for surveillance of nonspecific small right peritoneal soft tissue nodule inferior to right liver lobe, and tin left lower lobe pulmonary nodule. Subtle changes suggestive of cirrhosis with normal spleen. No thrombocytopenia. CEA was 2.4 in July 2021. Hep C screening 2014 negative. Hep A and B negative.   Linzess 145 was not strong enough. Taking dulcolax about once a week. BM only when taking dulcolax. Chronic constipation. Had to take a lot of miralax to get results. No rectal bleeding. Sometimes a pain after eating in epigastric area. Feels gnawing. States lasts a few seconds. If takes multiple pills at a time will have a problem, but if taking one at a time is fine. Has to eat small pieces of chicken, steak or otherwise will hang up. No vomiting. Sometimes nauseated but if eats will go away. No melena. Appetite is good most of the time.   GERD flares for last few months. Worse at night. Eats dinner around 6pm. Lays down around 10pm.   Past Medical History:  Diagnosis Date  . Blood transfusion    1980  . Diabetes mellitus   . Dysrhythmia   . Female bladder prolapse   . GERD 11/18/2008   Qualifier: Diagnosis of  By: Craige Cotta    . GERD (gastroesophageal reflux disease)   . Heart disease   . Hyperlipidemia   . Hypertension    echo and stress 4/10 reports on chart, EKG ` LOV 9/12  on chart  . Mild aortic stenosis     Past Surgical History:  Procedure Laterality Date  . ABDOMINAL HYSTERECTOMY    . ANTERIOR AND POSTERIOR REPAIR  04/26/2011   Procedure: ANTERIOR (CYSTOCELE) AND POSTERIOR REPAIR (RECTOCELE);  Surgeon: Reece Packer, MD;  Location: [facility];  Service: Urology;  Laterality: N/A;  . CATARACT EXTRACTION Bilateral    with IOL  . CHOLECYSTECTOMY  2009  . COLONOSCOPY N/A 12/02/2013   three colon polyps removed, small internal hemorrhoids. Hyperplastic polyps  . COLONOSCOPY N/A 11/20/2019   pancolonic diverticulosis, two 10-11 mm polyps in ascending colon, one 5 mm polyp in cecum, ascending colon with superficially invasive adenocarcinoma arising in background of sessile serrated polyps with low and high grade cytologic dysplasia.  . COLONOSCOPY W/ POLYPECTOMY    . LEFT HEART CATH  09/10/2008   normal coronary arteries, normal LV systolic function, EF 32% (Dr. [name])  . LYMPH NODE DISSECTION Right 1997   under arm  . NM MYOCAR PERF WALL MOTION  2010   dipyridamole - mild-mod in intenstiy perfusion defect in mid anterior, mid anteroseptal wall, EF 70%  . OVARY SURGERY     bilateral tumors removed  . POLYPECTOMY  11/20/2019   Procedure: POLYPECTOMY;  Surgeon: Daneil Dolin, MD;  Location: [facility];  Service: Endoscopy;;  hot and cold snare cecal polyp, and asending polyps x 2  .  THYROIDECTOMY    . THYROIDECTOMY  02/2008  . TRANSTHORACIC ECHOCARDIOGRAM  08/2011   EF=>55%, mild conc LVH; trace MR; mild TR; mild-mod AV calcification with mild valvular AV stenosis  . VAGINAL PROLAPSE REPAIR  04/26/2011   Procedure: VAGINAL VAULT SUSPENSION;  Surgeon: Reece Packer, MD;  Location: [facility];  Service: Urology;  Laterality: N/A;  with Graft  10x6    Current Outpatient Medications  Medication Sig Dispense Refill  . ALPHAGAN P 0.1 % SOLN Apply 1 drop to eye every morning.    Marland Kitchen amLODipine (NORVASC) 10 MG tablet TAKE 1 TABLET(10 MG) BY MOUTH EVERY  MORNING (Patient taking differently: Take 10 mg by mouth daily. TAKE 1 TABLET(10 MG) BY MOUTH EVERY MORNING) 90 tablet 3  . Ascorbic Acid (VITAMIN C) 1000 MG tablet Take 1,000 mg by mouth 4 (four) times a week.    Marland Kitchen aspirin 81 MG tablet Take 81 mg by mouth every other day.     . benazepril (LOTENSIN) 40 MG tablet TAKE 1 TABLET(40 MG) BY MOUTH DAILY 90 tablet 1  . Calcium Carbonate-Vit D-Min (CALCIUM 1200) 1200-1000 MG-UNIT CHEW Chew 1 each by mouth 4 (four) times a week.     . cholecalciferol (VITAMIN D3) 25 MCG (1000 UT) tablet Take 1,000 Units by mouth daily.    Marland Kitchen ezetimibe (ZETIA) 10 MG tablet TAKE 1 TABLET(10 MG) BY MOUTH DAILY 90 tablet 3  . glipiZIDE (GLUCOTROL XL) 5 MG 24 hr tablet TAKE 2 TABLETS BY MOUTH DAILY WITH BREAKFAST 60 tablet 3  . latanoprost (XALATAN) 0.005 % ophthalmic solution Place 1 drop into both eyes at bedtime.     Marland Kitchen LEVEMIR FLEXTOUCH 100 UNIT/ML FlexPen ADMINISTER 80 UNITS UNDER THE SKIN AT BEDTIME 30 mL 0  . metFORMIN (GLUCOPHAGE) 500 MG tablet TAKE 1 TABLET BY MOUTH EVERY DAY WITH BREAKFAST (Patient taking differently: Take 500 mg by mouth daily with breakfast. TAKE 1 TABLET BY MOUTH EVERY DAY WITH BREAKFAST) 60 tablet 2  . Multiple Vitamins-Minerals (CENTRUM) tablet Take 1 tablet by mouth every morning.     . Omega-3 Fatty Acids (FISH OIL) 1200 MG CAPS Take 1 capsule by mouth every morning.     . pravastatin (PRAVACHOL) 80 MG tablet TAKE 1 TABLET(80 MG) BY MOUTH DAILY 90 tablet 1  . triamterene-hydrochlorothiazide (MAXZIDE) 75-50 MG tablet TAKE 1 TABLET BY MOUTH DAILY 90 tablet 1  . Blood Glucose Monitoring Suppl (ACCU-CHEK GUIDE) w/Device KIT 1 each by Does not apply route 4 (four) times daily. 1 kit 0  . glucose blood (ACCU-CHEK GUIDE) test strip USE TO CHECK BLOOD SUGAR TWO TIMES DAILY 150 strip 1  . Insulin Pen Needle (B-D ULTRAFINE III SHORT PEN) 31G X 8 MM MISC USE AS DIRECTED AT BEDTIME WITH INSULIN PENS 100 each 3  . Lancets Ultra Fine MISC 1 each by Does not  apply route 4 (four) times daily. 150 each 5  . pantoprazole (PROTONIX) 40 MG tablet Take 1 tablet (40 mg total) by mouth daily. 30 minutes before meal 90 tablet 3   No current facility-administered medications for this visit.    Allergies as of 03/04/2020 - Review Complete 03/04/2020  Allergen Reaction Noted  . Senokot wheat bran [wheat bran]  01/18/2018  . Spironolactone  03/24/2014    Family History  Problem Relation Age of Onset  . Stomach cancer Mother 28  . Heart disease Mother 80       heart disease  . Heart disease Father 6  MI  . Stroke Maternal Grandfather   . Heart attack Paternal Grandfather   . Hypertension Brother   . Bone cancer Brother   . Hypertension Sister   . Liver cancer Sister   . Hypertension Sister   . Hypertension Child   . Colon cancer Neg Hx   . Colon polyps Neg Hx     Social History   Socioeconomic History  . Marital status: Married    Spouse name: [name]  . Number of children: 1  . Years of education: Trade  . Highest education level: 12th grade  Occupational History  . Occupation: Retired  Tobacco Use  . Smoking status: Never Smoker  . Smokeless tobacco: Never Used  Vaping Use  . Vaping Use: Never used  Substance and Sexual Activity  . Alcohol use: No    Comment: socially- none x 30 years  . Drug use: No  . Sexual activity: Yes  Other Topics Concern  . Not on file  Social History Narrative   Patient lives at home with spouse. 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 LIVED IN [place] FOR 50 YRS AND CAME BACK TO [place] ~2009. Caffeine Use: 1 cup of coffee daily. HAD ONE CHILD: PASSED 5 YRS. HAVE THREE GRAND-KIDS AND TWO GREAT GRANDS.    Social Determinants of Health   Financial Resource Strain: Low Risk   . Difficulty of Paying Living Expenses: Not hard at all  Food Insecurity:   . Worried About Charity fundraiser in the Last Year: Not on file  . Ran Out of Food in the Last Year: Not on file    Transportation Needs: No Transportation Needs  . Lack of Transportation (Medical): No  . Lack of Transportation (Non-Medical): No  Physical Activity: Insufficiently Active  . Days of Exercise per Week: 3 days  . Minutes of Exercise per Session: 20 min  Stress: No Stress Concern Present  . Feeling of Stress : Only a little  Social Connections: Socially Integrated  . Frequency of Communication with Friends and Family: Twice a week  . Frequency of Social Gatherings with Friends and Family: Twice a week  . Attends Religious Services: More than 4 times per year  . Active Member of Clubs or Organizations: No  . Attends Archivist Meetings: 1 to 4 times per year  . Marital Status: Married    Review of Systems: Gen: Denies fever, chills, anorexia. Denies fatigue, weakness, weight loss.  CV: Denies chest pain, palpitations, syncope, peripheral edema, and claudication. Resp: Denies dyspnea at rest, cough, wheezing, coughing up blood, and pleurisy. GI: see HPI Derm: Denies rash, itching, dry skin Psych: Denies depression, anxiety, memory loss, confusion. No homicidal or suicidal ideation.  Heme: Denies bruising, bleeding, and enlarged lymph nodes.  Physical Exam: BP 120/74   Pulse 80   Temp 97.8 F (36.6 C) (Temporal)   Ht 5' 5" (1.651 m)   Wt 181 lb (82.1 kg)   BMI 30.12 kg/m  General:   Alert and oriented. No distress noted. Pleasant and cooperative.  Head:  Normocephalic and atraumatic. Eyes:  Conjuctiva clear without scleral icterus. Mouth:  Oral mucosa pink and moist. Good dentition. No lesions. Cardiac: S1 S2 present with notable systolic murmur Lungs: clear bilaterally  Abdomen:  +BS, soft, non-tender and non-distended. No rebound or guarding. No HSM or masses noted. Msk:  Symmetrical without gross deformities. Normal posture. Extremities:  Without edema. Neurologic:  Alert and  oriented x4 Psych:  Alert  and cooperative. Normal mood and  affect.  ASSESSMENT: LAKESA COSTE is a 79 y.o. female presenting today in follow-up after colonoscopy earlier this year with a superficial invasive adenocarcinoma arising in background of a sessile serrated polyp in ascending colon. CEA 2.4 in July 2021. Early interval colonoscopy recommended with tattoing if growth noted.  Thus far, CT chest/abd/pelvis on file from July 2021 with repeat CT Chest/abdomen/pelvis to be completed in Nov 2021 for surveillance of nonspecific small right peritoneal soft tissue nodule inferior to right liver lobe, and tin left lower lobe pulmonary nodule. Subtle changes suggestive of cirrhosis with normal spleen. No thrombocytopenia. Doubt advanced liver disease.   She was evaluated by Dr. Marcello Moores already with [facility], moderate risk for surgery, and awaiting repeat colonoscopy.   Constipation not ideally managed. Will trial Linzess 290 mcg samples. She is to call with an update.  GERD: currently not on a PPI. Will start Protonix. Solid food dysphagia noted. Recommend EGD/dilation at time of colonoscopy.  I discussed patient's cardiac history with anesthesia. In light of moderate to severe aortic stenosis, they recommend she have procedure in [place]. We will refer to GI in [facility] for assistance with colonoscopy/EGD/dilation.    PLAN:  Protonix once daily  Repeat CT chest/abdomen/pelvis upcoming  Colonoscopy/EGD/dilation in near future. Referring to [facility].  Annitta Needs, PhD, ANP-BC [facility]

## 2020-03-04 NOTE — Patient Instructions (Addendum)
We are arranging a colonoscopy with upper endoscopy and dilation with Dr. Abbey Chatters in the near future.  For constipation: we actually do have samples of Linzess you can try. This is the highest dosage. Let's take this 30 minutes before breakfast daily. Please call if no improvement with this.  For reflux: start taking Protonix once daily, 30 minutes before a meal.   I will talk with anesthesia to make sure everything is good for the colonoscopy here.  Further recommendations to follow!  I enjoyed seeing you again today! As you know, I value our relationship and want to provide genuine, compassionate, and quality care. I welcome your feedback. If you receive a survey regarding your visit,  I greatly appreciate you taking time to fill this out. See you next time!  Annitta Needs, PhD, ANP-BC [facility]

## 2020-03-04 NOTE — Telephone Encounter (Signed)
Called pt, no answer, VM not set up. Pt needs TCS/EGD/DIL with Dr. Abbey Chatters, asa 3

## 2020-03-09 ENCOUNTER — Encounter: Payer: Self-pay | Admitting: *Deleted

## 2020-03-09 NOTE — Telephone Encounter (Signed)
Called pt. She has been scheduled for 12/14 at 10:!5am. Aware will mail prep instructions with pre-op/covid test appt. Confirmed mailing address

## 2020-03-10 ENCOUNTER — Ambulatory Visit (INDEPENDENT_AMBULATORY_CARE_PROVIDER_SITE_OTHER): Payer: Medicare Other | Admitting: "Endocrinology

## 2020-03-10 ENCOUNTER — Other Ambulatory Visit: Payer: Self-pay

## 2020-03-10 ENCOUNTER — Ambulatory Visit: Payer: Medicare Other | Admitting: "Endocrinology

## 2020-03-10 ENCOUNTER — Encounter: Payer: Self-pay | Admitting: "Endocrinology

## 2020-03-10 VITALS — BP 138/84 | HR 64 | Ht 65.0 in | Wt 182.2 lb

## 2020-03-10 DIAGNOSIS — N181 Chronic kidney disease, stage 1: Secondary | ICD-10-CM | POA: Diagnosis not present

## 2020-03-10 DIAGNOSIS — E782 Mixed hyperlipidemia: Secondary | ICD-10-CM | POA: Diagnosis not present

## 2020-03-10 DIAGNOSIS — E1122 Type 2 diabetes mellitus with diabetic chronic kidney disease: Secondary | ICD-10-CM | POA: Diagnosis not present

## 2020-03-10 DIAGNOSIS — I10 Essential (primary) hypertension: Secondary | ICD-10-CM | POA: Diagnosis not present

## 2020-03-10 DIAGNOSIS — Z794 Long term (current) use of insulin: Secondary | ICD-10-CM

## 2020-03-10 LAB — POCT GLYCOSYLATED HEMOGLOBIN (HGB A1C): Hemoglobin A1C: 8.8 % — AB (ref 4.0–5.6)

## 2020-03-10 MED ORDER — LEVEMIR FLEXTOUCH 100 UNIT/ML ~~LOC~~ SOPN: 50.0000 [IU] | PEN_INJECTOR | Freq: Two times a day (BID) | SUBCUTANEOUS | 2 refills | Status: DC

## 2020-03-10 NOTE — Progress Notes (Signed)
03/10/2020                           Endocrinology follow-up note   Subjective:    Patient ID: Joanna Brown, female    DOB: [birth year]. She Patient is being seen today for follow-up of uncontrolled type 2 diabetes, hyperlipidemia, hypertension.    PMD:   Fayrene Helper, MD  Past Medical History:  Diagnosis Date  . Blood transfusion    1980  . Diabetes mellitus   . Dysrhythmia   . Female bladder prolapse   . GERD 11/18/2008   Qualifier: Diagnosis of  By: Craige Cotta    . GERD (gastroesophageal reflux disease)   . Heart disease   . Hyperlipidemia   . Hypertension    echo and stress 4/10 reports on chart, EKG ` LOV 9/12 on chart  . Mild aortic stenosis    Past Surgical History:  Procedure Laterality Date  . ABDOMINAL HYSTERECTOMY    . ANTERIOR AND POSTERIOR REPAIR  04/26/2011   Procedure: ANTERIOR (CYSTOCELE) AND POSTERIOR REPAIR (RECTOCELE);  Surgeon: Reece Packer, MD;  Location: [facility];  Service: Urology;  Laterality: N/A;  . CATARACT EXTRACTION Bilateral    with IOL  . CHOLECYSTECTOMY  2009  . COLONOSCOPY N/A 12/02/2013   three colon polyps removed, small internal hemorrhoids. Hyperplastic polyps  . COLONOSCOPY N/A 11/20/2019   pancolonic diverticulosis, two 10-11 mm polyps in ascending colon, one 5 mm polyp in cecum, ascending colon with superficially invasive adenocarcinoma arising in background of sessile serrated polyps with low and high grade cytologic dysplasia.  . COLONOSCOPY W/ POLYPECTOMY    . LEFT HEART CATH  09/10/2008   normal coronary arteries, normal LV systolic function, EF 80% (Dr. [name])  . LYMPH NODE DISSECTION Right 1997   under arm  . NM MYOCAR PERF WALL MOTION  2010   dipyridamole - mild-mod in intenstiy perfusion defect in mid anterior, mid anteroseptal wall, EF 70%  . OVARY SURGERY     bilateral tumors removed  . POLYPECTOMY  11/20/2019   Procedure: POLYPECTOMY;  Surgeon: Daneil Dolin, MD;  Location: [facility];  Service:  Endoscopy;;  hot and cold snare cecal polyp, and asending polyps x 2  . THYROIDECTOMY    . THYROIDECTOMY  02/2008  . TRANSTHORACIC ECHOCARDIOGRAM  08/2011   EF=>55%, mild conc LVH; trace MR; mild TR; mild-mod AV calcification with mild valvular AV stenosis  . VAGINAL PROLAPSE REPAIR  04/26/2011   Procedure: VAGINAL VAULT SUSPENSION;  Surgeon: Reece Packer, MD;  Location: [facility];  Service: Urology;  Laterality: N/A;  with Graft  10x6   Social History   Socioeconomic History  . Marital status: Married    Spouse name: [name]  . Number of children: 1  . Years of education: Trade  . Highest education level: 12th grade  Occupational History  . Occupation: Retired  Tobacco Use  . Smoking status: Never Smoker  . Smokeless tobacco: Never Used  Vaping Use  . Vaping Use: Never used  Substance and Sexual Activity  . Alcohol use: No    Comment: socially- none x 30 years  . Drug use: No  . Sexual activity: Yes  Other Topics Concern  . Not on file  Social History Narrative   Patient lives at home with spouse. 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 LIVED IN [place] FOR 50 YRS AND CAME BACK TO [place] ~2009.  Caffeine Use: 1 cup of coffee daily. HAD ONE CHILD: PASSED 5 YRS. HAVE THREE GRAND-KIDS AND TWO GREAT GRANDS.    Social Determinants of Health   Financial Resource Strain: Low Risk   . Difficulty of Paying Living Expenses: Not hard at all  Food Insecurity:   . Worried About Charity fundraiser in the Last Year: Not on file  . Ran Out of Food in the Last Year: Not on file  Transportation Needs: No Transportation Needs  . Lack of Transportation (Medical): No  . Lack of Transportation (Non-Medical): No  Physical Activity: Insufficiently Active  . Days of Exercise per Week: 3 days  . Minutes of Exercise per Session: 20 min  Stress: No Stress Concern Present  . Feeling of Stress : Only a little  Social Connections: Socially Integrated  . Frequency of  Communication with Friends and Family: Twice a week  . Frequency of Social Gatherings with Friends and Family: Twice a week  . Attends Religious Services: More than 4 times per year  . Active Member of Clubs or Organizations: No  . Attends Archivist Meetings: 1 to 4 times per year  . Marital Status: Married   Outpatient Encounter Medications as of 03/10/2020  Medication Sig  . ALPHAGAN P 0.1 % SOLN Apply 1 drop to eye every morning.  Marland Kitchen amLODipine (NORVASC) 10 MG tablet TAKE 1 TABLET(10 MG) BY MOUTH EVERY MORNING (Patient taking differently: Take 10 mg by mouth daily. TAKE 1 TABLET(10 MG) BY MOUTH EVERY MORNING)  . Ascorbic Acid (VITAMIN C) 1000 MG tablet Take 1,000 mg by mouth 4 (four) times a week.  Marland Kitchen aspirin 81 MG tablet Take 81 mg by mouth every other day.   . benazepril (LOTENSIN) 40 MG tablet TAKE 1 TABLET(40 MG) BY MOUTH DAILY  . Blood Glucose Monitoring Suppl (ACCU-CHEK GUIDE) w/Device KIT 1 each by Does not apply route 4 (four) times daily.  . Calcium Carbonate-Vit D-Min (CALCIUM 1200) 1200-1000 MG-UNIT CHEW Chew 1 each by mouth 4 (four) times a week.   . cholecalciferol (VITAMIN D3) 25 MCG (1000 UT) tablet Take 1,000 Units by mouth daily.  Marland Kitchen ezetimibe (ZETIA) 10 MG tablet TAKE 1 TABLET(10 MG) BY MOUTH DAILY  . glipiZIDE (GLUCOTROL XL) 5 MG 24 hr tablet TAKE 2 TABLETS BY MOUTH DAILY WITH BREAKFAST  . glucose blood (ACCU-CHEK GUIDE) test strip USE TO CHECK BLOOD SUGAR TWO TIMES DAILY  . insulin detemir (LEVEMIR FLEXTOUCH) 100 UNIT/ML FlexPen Inject 50 Units into the skin 2 (two) times daily.  . Insulin Pen Needle (B-D ULTRAFINE III SHORT PEN) 31G X 8 MM MISC USE AS DIRECTED AT BEDTIME WITH INSULIN PENS  . Lancets Ultra Fine MISC 1 each by Does not apply route 4 (four) times daily.  Marland Kitchen latanoprost (XALATAN) 0.005 % ophthalmic solution Place 1 drop into both eyes at bedtime.   . metFORMIN (GLUCOPHAGE) 500 MG tablet TAKE 1 TABLET BY MOUTH EVERY DAY WITH BREAKFAST (Patient taking  differently: Take 500 mg by mouth daily with breakfast. TAKE 1 TABLET BY MOUTH EVERY DAY WITH BREAKFAST)  . Multiple Vitamins-Minerals (CENTRUM) tablet Take 1 tablet by mouth every morning.   . Omega-3 Fatty Acids (FISH OIL) 1200 MG CAPS Take 1 capsule by mouth every morning.   . pantoprazole (PROTONIX) 40 MG tablet Take 1 tablet (40 mg total) by mouth daily. 30 minutes before meal  . pravastatin (PRAVACHOL) 80 MG tablet TAKE 1 TABLET(80 MG) BY MOUTH DAILY  . triamterene-hydrochlorothiazide (MAXZIDE) 75-50  MG tablet TAKE 1 TABLET BY MOUTH DAILY  . [DISCONTINUED] LEVEMIR FLEXTOUCH 100 UNIT/ML FlexPen ADMINISTER 80 UNITS UNDER THE SKIN AT BEDTIME   No facility-administered encounter medications on file as of 03/10/2020.   ALLERGIES: Allergies  Allergen Reactions  . Senokot Wheat Bran [Wheat Bran]     ABDOMINAL CRAMPS  . Spironolactone     Stomach problems, vision changes    VACCINATION STATUS: Immunization History  Administered Date(s) Administered  . Pneumococcal Conjugate-13 12/11/2013  . Pneumococcal Polysaccharide-23 01/13/2010  . Tdap 10/05/2010    Diabetes She presents for her follow-up diabetic visit. She has type 2 diabetes mellitus. Onset time: She was diagnosed at approximate age of 63 years. Her disease course has been worsening. There are no hypoglycemic associated symptoms. Pertinent negatives for hypoglycemia include no confusion, headaches, pallor or seizures. Pertinent negatives for diabetes include no blurred vision, no chest pain, no fatigue, no polydipsia, no polyphagia and no polyuria. There are no hypoglycemic complications. Symptoms are worsening. Diabetic complications include nephropathy and retinopathy. Risk factors for coronary artery disease include dyslipidemia, diabetes mellitus, obesity and sedentary lifestyle. Her weight is increasing steadily. She is following a generally unhealthy diet. When asked about meal planning, she reported none. She has had a previous  visit with a dietitian. She rarely participates in exercise. Her home blood glucose trend is increasing steadily. Her breakfast blood glucose range is generally 180-200 mg/dl. Her bedtime blood glucose range is generally 180-200 mg/dl. Her overall blood glucose range is 180-200 mg/dl. (She presents with above target glycemic profile both fasting and postprandial.  Her point-of-care A1c is 8.8%, increasing from 8.3%.   She did not document or report hypoglycemia.  ) Eye exam is current.  Hyperlipidemia This is a chronic problem. The current episode started more than 1 year ago. The problem is uncontrolled. Exacerbating diseases include diabetes and obesity. Pertinent negatives include no chest pain, myalgias or shortness of breath. Current antihyperlipidemic treatment includes statins. Risk factors for coronary artery disease include dyslipidemia, diabetes mellitus, hypertension, obesity, a sedentary lifestyle and post-menopausal.  Hypertension This is a chronic problem. The current episode started more than 1 year ago. Pertinent negatives include no blurred vision, chest pain, headaches, palpitations or shortness of breath. Risk factors for coronary artery disease include dyslipidemia, diabetes mellitus, obesity and sedentary lifestyle. Hypertensive end-organ damage includes retinopathy.   Nodular goiter She Patient is being seen today for a new issue with her thyroid.  She was found to have 2.6 cm nodule on right lobe of her thyroid while undergoing CT scan of the chest during cancer surveillance.  She has previous history of left hemithyroidectomy approximately 10 years ago for multinodular goiter.  She is not on thyroid hormone supplement or replacement.  She denies dysphagia, shortness of breath, nor voice change. -Her subsequent thyroid ultrasound confirms multinodular right lobe of the thyroid with surgically absent left lobe.  Her right thyroid lobe nodule did not meet criteria for  biopsy.  Review of systems  Constitutional: + Minimally fluctuating body weight, current  Body mass index is 30.32 kg/m. , no fatigue, no subjective hyperthermia, no subjective hypothermia Eyes: no blurry vision, no xerophthalmia ENT: no sore throat, no nodules palpated in throat, no dysphagia/odynophagia, no hoarseness Cardiovascular: no Chest Pain, no Shortness of Breath, no palpitations, no leg swelling Respiratory: no cough, no shortness of breath Gastrointestinal: no Nausea/Vomiting/Diarhhea Musculoskeletal: no muscle/joint aches Skin: no rashes, no hyperemia Neurological: no tremors, no numbness, no tingling, no dizziness Psychiatric: no depression, no  anxiety   Objective:    BP 138/84   Pulse 64   Ht '5\' 5"'  (1.651 m)   Wt 182 lb 3.2 oz (82.6 kg)   BMI 30.32 kg/m   Wt Readings from Last 3 Encounters:  03/10/20 182 lb 3.2 oz (82.6 kg)  03/04/20 181 lb (82.1 kg)  12/26/19 179 lb 3.2 oz (81.3 kg)    Physical Exam- Limited  Constitutional:  Body mass index is 30.32 kg/m. , not in acute distress, normal state of mind Eyes:  EOMI, no exophthalmos Neck: Supple Thyroid: No gross goiter Respiratory: Adequate breathing efforts Musculoskeletal: no gross deformities, strength intact in all four extremities, no gross restriction of joint movements Skin:  no rashes, no hyperemia Neurological: no tremor with outstretched hands   CMP ( most recent) CMP     Component Value Date/Time   NA 137 03/02/2020 0848   NA 135 12/31/2018 0915   K 4.2 03/02/2020 0848   CL 98 03/02/2020 0848   CO2 29 03/02/2020 0848   GLUCOSE 140 (H) 03/02/2020 0848   BUN 16 03/02/2020 0848   BUN 13 12/31/2018 0915   CREATININE 1.11 (H) 03/02/2020 0848   CALCIUM 10.3 03/02/2020 0848   PROT 7.3 03/02/2020 0848   PROT 7.4 12/31/2018 0915   ALBUMIN 4.6 12/31/2018 0915   AST 27 03/02/2020 0848   ALT 23 03/02/2020 0848   ALKPHOS 57 12/31/2018 0915   BILITOT 0.5 03/02/2020 0848   BILITOT 0.2  12/31/2018 0915   GFRNONAA 57 (L) 07/30/2019 0718   GFRAA 66 07/30/2019 0718    Diabetic Labs (most recent): Lab Results  Component Value Date   HGBA1C 8.8 (A) 03/10/2020   HGBA1C 8.3 (A) 11/08/2019   HGBA1C 9.0 (A) 08/08/2019    Lipid Panel     Component Value Date/Time   CHOL 161 07/30/2019 0718   TRIG 179 (H) 07/30/2019 0718   HDL 34 (L) 07/30/2019 0718   CHOLHDL 4.7 07/30/2019 0718   VLDL 39 12/05/2017 0832   LDLCALC 99 07/30/2019 0718   Incidental finding on chest CT on December 05, 2019 Enlarged  multinodular remnant right thyroid with dominant 2.6 cm hypodense Nodule.   Thyroid ultrasound on December 19, 2019: Right lobe 4.4 cm, left lobe absent surgically. No adenopathy  IMPRESSION: Surgical changes of left hemithyroidectomy. Multinodular thyroid. 3.9 cm and 1.6 cm cystic/almost completely cystic nodules with smooth margins, No thyroid nodule meets criteria for biopsy or surveillance, as designated by the newly established ACR TI-RADS criteria.   Assessment & Plan:     1.  Nodular goiter: Patient with remote past history of left hemithyroidectomy for multinodular goiter.  She did not require thyroid hormone supplement.  She was incidentally found to have 2.6 cm nodule in the right lobe. -Her subsequent dedicated thyroid ultrasound confirmed 2 nodules on the right lobe of her thyroid which did not meet any criteria for biopsy. -She would not need any antithyroid intervention at this time, TFTs are consistent with euthyroid state.  She will be considered for repeat ultrasound in 1 year.  2. Type 2 diabetes mellitus with stage 1 chronic kidney disease, with long-term current use of insulin ([place]) She presents with above target glycemic profile both fasting and postprandial.  Her point-of-care A1c is 8.8%, increasing from 8.3%.   She did not document or report hypoglycemia.   Recent labs reviewed, showing improving renal function.     Her diabetes is complicated by  CKD obesity/sedentary life and patient remains at  a high risk for more acute and chronic complications of diabetes which include CAD, CVA, CKD, retinopathy, and neuropathy. These are all discussed in detail with the patient.  - I have counseled the patient on diet management and weight loss, by adopting a carbohydrate restricted/protein rich diet.  -She still admits to dietary indiscretions including consumption of sweets and sweetened beverages. - she  admits there is a room for improvement in her diet and drink choices. -  Suggestion is made for her to avoid simple carbohydrates  from her diet including Cakes, Sweet Desserts / Pastries, Ice Cream, Soda (diet and regular), Sweet Tea, Candies, Chips, Cookies, Sweet Pastries,  Store Bought Juices, Alcohol in Excess of  1-2 drinks a day, Artificial Sweeteners, Coffee Creamer, and "Sugar-free" Products. This will help patient to have stable blood glucose profile and potentially avoid unintended weight gain.   - I encouraged the patient to switch to  unprocessed or minimally processed complex starch and increased protein intake (animal or plant source), fruits, and vegetables.  - Patient is advised to stick to a routine mealtimes to eat 3 meals  a day and avoid unnecessary snacks ( to snack only to correct hypoglycemia).   - I have approached patient with the following individualized plan to manage diabetes and patient agrees:   -In light of her presentation with above target glycemic profile, she will need multiple daily injections of insulin.   -This patient will be overwhelmed with basal/bolus insulin regimen.   -For simplicity reasons, she is advised to increase her Levemir to 50 units twice daily, at breakfast and at bedtime.     -Patient is encouraged to call clinic for blood glucose levels less than 70 or above 200 mg /dl. -She is advised to to continue glipizide 5 mg XL p.o. daily at breakfast.  She has steroids, however will benefit from  Metformin 500 mg p.o. daily at breakfast.    -Patient is not a candidate for  SGLT2 inhibitors due to CKD. -If her next visit A1c is  above  9% on next visit, she will be reconsidered for  prandial insulin in addition to her basal insulin.  - Patient specific target  A1c;  LDL, HDL, Triglycerides, were discussed in detail.  3) BP/HTN:  -Her blood pressure is controlled to target.   She is advised to continue her current blood pressure medications including benazepril 40 mg p.o. daily.   4) Lipids/HPL: Her recent lipid panel showed uncontrolled LDL at 97.  She is advised to continue pravastatin 80 mg p.o. nightly.  Side effects and precautions discussed with her.           5)  Weight/Diet: Her BMI is 29.8 -she is a candidate for moderate weight loss.  CDE Consult has been initiated , exercise, and detailed carbohydrates information provided.  6) Chronic Care/Health Maintenance:  -Patient is on ACEI/ARB and Statin medications and encouraged to continue to follow up with Ophthalmology, Podiatrist at least yearly or according to recommendations, and advised to  stay away from smoking. I have recommended yearly flu vaccine and pneumonia vaccination at least every 5 years; moderate intensity exercise for up to 150 minutes weekly; and  sleep for at least 7 hours a day.   - I advised patient to maintain close follow up with Fayrene Helper, MD for primary care needs.  - Time spent on this patient care encounter:  35 min, of which > 50% was spent in  counseling and the rest reviewing  her blood glucose logs , discussing her hypoglycemia and hyperglycemia episodes, reviewing her current and  previous labs / studies  ( including abstraction from other facilities) and medications  doses and developing a  long term treatment plan and documenting her care.   Please refer to Patient Instructions for Blood Glucose Monitoring and Insulin/Medications Dosing Guide"  in media tab for additional information.  Please  also refer to " Patient Self Inventory" in the Media  tab for reviewed elements of pertinent patient history.  Derinda Late Sowell participated in the discussions, expressed understanding, and voiced agreement with the above plans.  All questions were answered to her satisfaction. she is encouraged to contact clinic should she have any questions or concerns prior to her return visit.    Follow up plan: - Return for Bring Meter and Logs- A1c in Office, ABI in Office [place].  Glade Lloyd, MD Phone: [phone]  Fax: [phone]   This note was partially dictated with voice recognition software. Similar sounding words can be transcribed inadequately or may not  be corrected upon review.  03/10/2020, 8:43 AM

## 2020-03-10 NOTE — Patient Instructions (Signed)

## 2020-03-10 NOTE — Progress Notes (Signed)
Pt wants to discuss continuing metformin and maxzide.

## 2020-03-11 ENCOUNTER — Telehealth: Payer: Self-pay | Admitting: Gastroenterology

## 2020-03-11 DIAGNOSIS — R1310 Dysphagia, unspecified: Secondary | ICD-10-CM

## 2020-03-11 DIAGNOSIS — Z85038 Personal history of other malignant neoplasm of large intestine: Secondary | ICD-10-CM

## 2020-03-11 DIAGNOSIS — K219 Gastro-esophageal reflux disease without esophagitis: Secondary | ICD-10-CM

## 2020-03-11 NOTE — Telephone Encounter (Signed)
Yes, I'm sorry, cancel TCS/EGD/dilation.  Needs referral to [facility] for TCS/EGD/dilation.

## 2020-03-11 NOTE — Telephone Encounter (Signed)
[facility] clinical pool:  I attempted to call patient but was unable to reach. We need to cancel colonoscopy here at [facility] and refer to [facility] for early interval colonoscopy. She has moderate to severe aortic stenosis. I reviewed with anesthesia, and they feel she would be better suited in [place] due to her history.   History of colonoscopy earlier this year with a superficial invasive adenocarcinoma arising in background of a sessile serrated polyp in ascending colon. CEA 2.4 in July 2021. Early interval colonoscopy recommended with tattoing if growth noted.

## 2020-03-11 NOTE — Telephone Encounter (Signed)
Patient is scheduled for TCS/EGD/DIL. Does everything need to be cancelled?

## 2020-03-11 NOTE — Progress Notes (Signed)
Cc'ed to pcp °

## 2020-03-12 NOTE — Addendum Note (Signed)
Addended by: Cheron Every on: 03/12/2020 08:02 AM   Modules accepted: Orders

## 2020-03-12 NOTE — Telephone Encounter (Signed)
Referral sent to [facility]. Message sent to [facility] in endo to cancel procedure.

## 2020-03-23 ENCOUNTER — Other Ambulatory Visit: Payer: Self-pay

## 2020-03-23 ENCOUNTER — Ambulatory Visit (HOSPITAL_COMMUNITY)
Admission: RE | Admit: 2020-03-23 | Discharge: 2020-03-23 | Disposition: A | Discharge status: Home / Self Care | Payer: Medicare Other | Source: Ambulatory Visit | Attending: Gastroenterology | Admitting: Gastroenterology

## 2020-03-23 DIAGNOSIS — K7689 Other specified diseases of liver: Secondary | ICD-10-CM | POA: Diagnosis not present

## 2020-03-23 DIAGNOSIS — I700 Atherosclerosis of aorta: Secondary | ICD-10-CM | POA: Diagnosis not present

## 2020-03-23 DIAGNOSIS — C189 Malignant neoplasm of colon, unspecified: Secondary | ICD-10-CM | POA: Diagnosis not present

## 2020-03-23 DIAGNOSIS — M47814 Spondylosis without myelopathy or radiculopathy, thoracic region: Secondary | ICD-10-CM | POA: Diagnosis not present

## 2020-03-23 DIAGNOSIS — M5136 Other intervertebral disc degeneration, lumbar region: Secondary | ICD-10-CM | POA: Diagnosis not present

## 2020-03-23 DIAGNOSIS — K429 Umbilical hernia without obstruction or gangrene: Secondary | ICD-10-CM | POA: Diagnosis not present

## 2020-03-23 DIAGNOSIS — I2510 Atherosclerotic heart disease of native coronary artery without angina pectoris: Secondary | ICD-10-CM | POA: Diagnosis not present

## 2020-03-23 MED ORDER — IOHEXOL 300 MG/ML  SOLN
100.0000 mL | Freq: Once | INTRAMUSCULAR | Status: AC | PRN
  Administered 2020-03-23: 100 mL via INTRAVENOUS

## 2020-03-24 DIAGNOSIS — H401131 Primary open-angle glaucoma, bilateral, mild stage: Secondary | ICD-10-CM | POA: Diagnosis not present

## 2020-03-31 ENCOUNTER — Ambulatory Visit: Payer: Medicare Other | Admitting: "Endocrinology

## 2020-04-01 ENCOUNTER — Ambulatory Visit (INDEPENDENT_AMBULATORY_CARE_PROVIDER_SITE_OTHER): Payer: Medicare Other | Admitting: Family Medicine

## 2020-04-01 ENCOUNTER — Encounter: Payer: Self-pay | Admitting: Family Medicine

## 2020-04-01 ENCOUNTER — Other Ambulatory Visit: Payer: Self-pay

## 2020-04-01 VITALS — BP 132/78 | HR 83 | Resp 16 | Ht 65.0 in | Wt 180.0 lb

## 2020-04-01 DIAGNOSIS — E663 Overweight: Secondary | ICD-10-CM | POA: Diagnosis not present

## 2020-04-01 DIAGNOSIS — N181 Chronic kidney disease, stage 1: Secondary | ICD-10-CM

## 2020-04-01 DIAGNOSIS — E782 Mixed hyperlipidemia: Secondary | ICD-10-CM | POA: Diagnosis not present

## 2020-04-01 DIAGNOSIS — I10 Essential (primary) hypertension: Secondary | ICD-10-CM | POA: Diagnosis not present

## 2020-04-01 DIAGNOSIS — E1122 Type 2 diabetes mellitus with diabetic chronic kidney disease: Secondary | ICD-10-CM | POA: Diagnosis not present

## 2020-04-01 DIAGNOSIS — Z794 Long term (current) use of insulin: Secondary | ICD-10-CM

## 2020-04-01 MED ORDER — CLOTRIMAZOLE-BETAMETHASONE 1-0.05 % EX CREA: TOPICAL_CREAM | CUTANEOUS | 0 refills | Status: DC

## 2020-04-01 MED ORDER — LEVEMIR FLEXTOUCH 100 UNIT/ML ~~LOC~~ SOPN: 50.0000 [IU] | PEN_INJECTOR | Freq: Two times a day (BID) | SUBCUTANEOUS | 5 refills | Status: DC

## 2020-04-01 NOTE — Assessment & Plan Note (Signed)
  Patient re-educated about  the importance of commitment to a  minimum of 150 minutes of exercise per week as able.  The importance of healthy food choices with portion control discussed, as well as eating regularly and within a 12 hour window most days. The need to choose "clean , green" food 50 to 75% of the time is discussed, as well as to make water the primary drink and set a goal of 64 ounces water daily.    Weight /BMI 04/01/2020 03/10/2020 03/04/2020  WEIGHT 180 lb 182 lb 3.2 oz 181 lb  HEIGHT 5\' 5"  5\' 5"  5\' 5"   BMI 29.95 kg/m2 30.32 kg/m2 30.12 kg/m2

## 2020-04-01 NOTE — Assessment & Plan Note (Signed)
Managed by endo, not at goal Joanna Brown is reminded of the importance of commitment to daily physical activity for 30 minutes or more, as able and the need to limit carbohydrate intake to 30 to 60 grams per meal to help with blood sugar control.   The need to take medication as prescribed, test blood sugar as directed, and to call between visits if there is a concern that blood sugar is uncontrolled is also discussed.   Joanna Brown is reminded of the importance of daily foot exam, annual eye examination, and good blood sugar, blood pressure and cholesterol control.  Diabetic Labs Latest Ref Rng & Units 03/10/2020 03/02/2020 12/05/2019 11/08/2019 08/08/2019  HbA1c 4.0 - 5.6 % 8.8(A) - - 8.3(A) 9.0(A)  Microalbumin mg/dL - - - - -  Micro/Creat Ratio <30 mcg/mg creat - - - - -  Chol <200 mg/dL - - - - -  HDL > OR = 50 mg/dL - - - - -  Calc LDL mg/dL (calc) - - - - -  Triglycerides <150 mg/dL - - - - -  Creatinine 0.60 - 0.93 mg/dL - 1.11(H) 1.30(H) - -   BP/Weight 04/01/2020 03/10/2020 03/04/2020 12/26/2019 12/10/2019 3/66/2947 10/18/4648  Systolic BP 354 656 812 751 700 174 944  Diastolic BP 78 84 74 66 62 72 58  Wt. (Lbs) 180 182.2 181 179.2 181.8 183.8 -  BMI 29.95 30.32 30.12 29.82 30.25 30.59 29.85   Foot/eye exam completion dates Latest Ref Rng & Units 10/24/2019 09/25/2018  Eye Exam No Retinopathy No Retinopathy No Retinopathy  Foot Form Completion - - -

## 2020-04-01 NOTE — Patient Instructions (Addendum)
F/u in  Office with MD in 5 months, call if you need me sooner  Stay on diabetes medication you are taking  Please get fasting lipi, chem 7 and EGFr first week in December  Change food environment at home please!  All the best with colonoscopy, I will follow along  It is important that you exercise regularly at least 30 minutes 5 times a week. If you develop chest pain, have severe difficulty breathing, or feel very tired, stop exercising immediately and seek medical attention   Cream is sent for fungal rash on left jaw  Thanks for choosing [facility], we consider it a privelige to serve you.  

## 2020-04-01 NOTE — Assessment & Plan Note (Signed)
Hyperlipidemia:Low fat diet discussed and encouraged.   Lipid Panel  Lab Results  Component Value Date   CHOL 161 07/30/2019   HDL 34 (L) 07/30/2019   LDLCALC 99 07/30/2019   TRIG 179 (H) 07/30/2019   CHOLHDL 4.7 07/30/2019     Updated lab needed at/ before next visit. Not at goal

## 2020-04-01 NOTE — Assessment & Plan Note (Signed)
Controlled, no change in medication DASH diet and commitment to daily physical activity for a minimum of 30 minutes discussed and encouraged, as a part of hypertension management. The importance of attaining a healthy weight is also discussed.  BP/Weight 04/01/2020 03/10/2020 03/04/2020 12/26/2019 12/10/2019 8/67/6720 01/18/7095  Systolic BP 283 662 947 654 650 354 656  Diastolic BP 78 84 74 66 62 72 58  Wt. (Lbs) 180 182.2 181 179.2 181.8 183.8 -  BMI 29.95 30.32 30.12 29.82 30.25 30.59 29.85

## 2020-04-01 NOTE — Progress Notes (Signed)
Joanna Brown     MRN: [id number]      DOB: [birth year]   HPI Joanna Brown is here for follow up and re-evaluation of chronic medical conditions, medication management and review of any available recent lab and radiology data.  Preventive health is updated, specifically  Cancer screening and Immunization.   Questions or concerns regarding consultations or procedures which the PT has had in the interim are  Addressed. Voices concerns about metformin and I address this assuring her the medication is safe and kidney function is monitored regularly Till has uncontrolled blood sugars and needs to work more diligently with food choice The PT denies any adverse reactions to current medications since the last visit.  3 week h/o itchy neck rash Prelim dx of colon cancer , has upcoming repeat colonoscopy to clarify  ROS Denies recent fever or chills. Denies sinus pressure, nasal congestion, ear pain or sore throat. Denies chest congestion, productive cough or wheezing. Denies chest pains, palpitations and leg swelling Denies abdominal pain, nausea, vomiting,diarrhea or constipation.   Denies dysuria, frequency, hesitancy or incontinence. Denies joint pain, swelling and limitation in mobility. Denies headaches, seizures, numbness, or tingling. Denies depression, anxiety or insomnia.  PE  BP 132/78   Pulse 83   Resp 16   Ht 5\' 5"  (1.651 m)   Wt 180 lb (81.6 kg)   SpO2 98%   BMI 29.95 kg/m   Patient alert and oriented and in Brown cardiopulmonary distress.  HEENT: Brown facial asymmetry, EOMI,     Neck supple .  Chest: Clear to auscultation bilaterally.  CVS: S1, S2 systolic murmurs, Brown S3.Regular rate.  ABD: Soft non tender.   Ext: Brown edema  MS: Adequate ROM spine, shoulders, hips and knees.  Skin: Intact, Brown ulcerations or rash noted.  Psych: Good eye contact, normal affect. Memory intact not anxious or depressed appearing.  CNS: CN 2-12 intact, power,  normal throughout.Brown  focal deficits noted.   Assessment & Plan  Essential hypertension Controlled, Brown change in medication DASH diet and commitment to daily physical activity for a minimum of 30 minutes discussed and encouraged, as a part of hypertension management. The importance of attaining a healthy weight is also discussed.  BP/Weight 04/01/2020 03/10/2020 03/04/2020 12/26/2019 12/10/2019 8/67/6195 0/01/3266  Systolic BP 124 580 998 338 250 539 767  Diastolic BP 78 84 74 66 62 72 58  Wt. (Lbs) 180 182.2 181 179.2 181.8 183.8 -  BMI 29.95 30.32 30.12 29.82 30.25 30.59 29.85       Type 2 diabetes mellitus with stage 1 chronic kidney disease, with long-term current use of insulin ([place]) Managed by endo, not at goal Joanna Brown is reminded of the importance of commitment to daily physical activity for 30 minutes or more, as able and the need to limit carbohydrate intake to 30 to 60 grams per meal to help with blood sugar control.   The need to take medication as prescribed, test blood sugar as directed, and to call between visits if there is a concern that blood sugar is uncontrolled is also discussed.   Joanna Brown is reminded of the importance of daily foot exam, annual eye examination, and good blood sugar, blood pressure and cholesterol control.  Diabetic Labs Latest Ref Rng & Units 03/10/2020 03/02/2020 12/05/2019 11/08/2019 08/08/2019  HbA1c 4.0 - 5.6 % 8.8(A) - - 8.3(A) 9.0(A)  Microalbumin mg/dL - - - - -  Micro/Creat Ratio <30 mcg/mg creat - - - - -  Chol <200 mg/dL - - - - -  HDL > OR = 50 mg/dL - - - - -  Calc LDL mg/dL (calc) - - - - -  Triglycerides <150 mg/dL - - - - -  Creatinine 0.60 - 0.93 mg/dL - 1.11(H) 1.30(H) - -   BP/Weight 04/01/2020 03/10/2020 03/04/2020 12/26/2019 12/10/2019 5/69/7948 0/05/6551  Systolic BP 748 270 786 754 492 010 071  Diastolic BP 78 84 74 66 62 72 58  Wt. (Lbs) 180 182.2 181 179.2 181.8 183.8 -  BMI 29.95 30.32 30.12 29.82 30.25 30.59 29.85   Foot/eye exam  completion dates Latest Ref Rng & Units 10/24/2019 09/25/2018  Eye Exam Brown Retinopathy Brown Retinopathy Brown Retinopathy  Foot Form Completion - - -        Mixed hyperlipidemia Hyperlipidemia:Low fat diet discussed and encouraged.   Lipid Panel  Lab Results  Component Value Date   CHOL 161 07/30/2019   HDL 34 (L) 07/30/2019   LDLCALC 99 07/30/2019   TRIG 179 (H) 07/30/2019   CHOLHDL 4.7 07/30/2019     Updated lab needed at/ before next visit. Not at goal  Overweight (BMI 25.0-29.9)  Patient re-educated about  the importance of commitment to a  minimum of 150 minutes of exercise per week as able.  The importance of healthy food choices with portion control discussed, as well as eating regularly and within a 12 hour window most days. The need to choose "clean , green" food 50 to 75% of the time is discussed, as well as to make water the primary drink and set a goal of 64 ounces water daily.    Weight /BMI 04/01/2020 03/10/2020 03/04/2020  WEIGHT 180 lb 182 lb 3.2 oz 181 lb  HEIGHT 5\' 5"  5\' 5"  5\' 5"   BMI 29.95 kg/m2 30.32 kg/m2 30.12 kg/m2

## 2020-04-08 ENCOUNTER — Telehealth: Payer: Self-pay

## 2020-04-08 ENCOUNTER — Ambulatory Visit: Payer: Medicare Other | Admitting: Family Medicine

## 2020-04-08 ENCOUNTER — Other Ambulatory Visit: Payer: Self-pay

## 2020-04-08 DIAGNOSIS — Z794 Long term (current) use of insulin: Secondary | ICD-10-CM

## 2020-04-08 DIAGNOSIS — N181 Chronic kidney disease, stage 1: Secondary | ICD-10-CM

## 2020-04-08 DIAGNOSIS — E1122 Type 2 diabetes mellitus with diabetic chronic kidney disease: Secondary | ICD-10-CM

## 2020-04-08 MED ORDER — BD PEN NEEDLE SHORT U/F 31G X 8 MM MISC: 5 refills | Status: DC

## 2020-04-08 NOTE — Telephone Encounter (Signed)
Rx sent 

## 2020-04-08 NOTE — Telephone Encounter (Signed)
Patient requesting refill on Insulin Pen Needle (B-D ULTRAFINE III SHORT PEN) 31G X 8 MM MISC. She said it needs to be for 2x a day because that is what Dr Dorris Fetch wants her to do. The RX they have is incorrect. [facility] on [address].

## 2020-04-14 ENCOUNTER — Encounter: Payer: Self-pay | Admitting: Podiatry

## 2020-04-14 ENCOUNTER — Other Ambulatory Visit: Payer: Self-pay

## 2020-04-14 ENCOUNTER — Ambulatory Visit (INDEPENDENT_AMBULATORY_CARE_PROVIDER_SITE_OTHER): Payer: Medicare Other | Admitting: Podiatry

## 2020-04-14 DIAGNOSIS — M79675 Pain in left toe(s): Secondary | ICD-10-CM

## 2020-04-14 DIAGNOSIS — M2041 Other hammer toe(s) (acquired), right foot: Secondary | ICD-10-CM | POA: Diagnosis not present

## 2020-04-14 DIAGNOSIS — M2042 Other hammer toe(s) (acquired), left foot: Secondary | ICD-10-CM

## 2020-04-14 DIAGNOSIS — B351 Tinea unguium: Secondary | ICD-10-CM | POA: Diagnosis not present

## 2020-04-14 DIAGNOSIS — M79674 Pain in right toe(s): Secondary | ICD-10-CM | POA: Diagnosis not present

## 2020-04-14 DIAGNOSIS — E1142 Type 2 diabetes mellitus with diabetic polyneuropathy: Secondary | ICD-10-CM | POA: Diagnosis not present

## 2020-04-14 DIAGNOSIS — M2011 Hallux valgus (acquired), right foot: Secondary | ICD-10-CM | POA: Diagnosis not present

## 2020-04-14 DIAGNOSIS — M2012 Hallux valgus (acquired), left foot: Secondary | ICD-10-CM | POA: Diagnosis not present

## 2020-04-14 NOTE — Progress Notes (Signed)
This patient returns to my office for at risk foot care.  This patient requires this care by a professional since this patient will be at risk due to having diabetes and kidney disease.    This patient is unable to cut nails herself since the patient cannot reach her nails.These nails are painful walking and wearing shoes.  This patient presents for at risk foot care today.  General Appearance  Alert, conversant and in no acute stress.  Vascular  Dorsalis pedis and posterior tibial  pulses are palpable  bilaterally.  Capillary return is within normal limits  bilaterally. Temperature is within normal limits  bilaterally.  Neurologic  Senn-Weinstein monofilament wire test within normal limits  bilaterally. Muscle power within normal limits bilaterally.  Nails Thick disfigured discolored nails with subungual debris  from hallux to fifth toes bilaterally. No evidence of bacterial infection or drainage bilaterally.  Orthopedic  No limitations of motion  feet .  No crepitus or effusions noted.  No bony pathology or digital deformities noted.  Mild  HAV  B/L.  Skin  normotropic skin with no porokeratosis noted bilaterally.  No signs of infections or ulcers noted.     Onychomycosis  Pain in right toe  Pain in left toe.  Consent was obtained for treatment procedures.  Debridement and grinding of long thick nails with clearing of subungual debris with nail nipper followed by dremel tool..  No infection or ulcer.     Return office visit   10 weeks        Told patient to return for periodic foot care and evaluation due to potential at risk complications.   Marilena Trevathan DPM  

## 2020-04-16 ENCOUNTER — Ambulatory Visit (INDEPENDENT_AMBULATORY_CARE_PROVIDER_SITE_OTHER): Payer: Medicare Other | Admitting: Internal Medicine

## 2020-04-16 ENCOUNTER — Encounter: Payer: Self-pay | Admitting: Internal Medicine

## 2020-04-16 ENCOUNTER — Other Ambulatory Visit: Payer: Self-pay

## 2020-04-16 VITALS — BP 110/60 | HR 77 | Ht 65.0 in | Wt 184.0 lb

## 2020-04-16 DIAGNOSIS — E782 Mixed hyperlipidemia: Secondary | ICD-10-CM

## 2020-04-16 DIAGNOSIS — I350 Nonrheumatic aortic (valve) stenosis: Secondary | ICD-10-CM | POA: Diagnosis not present

## 2020-04-16 DIAGNOSIS — I10 Essential (primary) hypertension: Secondary | ICD-10-CM

## 2020-04-16 NOTE — Patient Instructions (Addendum)
Medication Instructions:  Your physician recommends that you continue on your current medications as directed. Please refer to the Current Medication list given to you today.  *If you need a refill on your cardiac medications before your next appointment, please call your pharmacy*   Testing/Procedures: Echocardiogram as scheduled June 01, 2020   Follow-Up: At [facility], you and your health needs are our priority.  As part of our continuing mission to provide you with exceptional heart care, we have created designated Provider Care Teams.  These Care Teams include your primary Cardiologist (physician) and Advanced Practice Providers (APPs -  Physician Assistants and Nurse Practitioners) who all work together to provide you with the care you need, when you need it.  We recommend signing up for the patient portal called "MyChart".  Sign up information is provided on this After Visit Summary.  MyChart is used to connect with patients for Virtual Visits (Telemedicine).  Patients are able to view lab/test results, encounter notes, upcoming appointments, etc.  Non-urgent messages can be sent to your provider as well.   To learn more about what you can do with MyChart, go to [email or web address].    Your next appointment:   6 month(s)  The format for your next appointment:   In Person  Provider:   You may see [name], MD or one of the following Advanced Practice Providers on your designated Care Team:    Almyra Deforest, PA-C  Fabian Sharp, PA-C or   Roby Lofts, [place]    Other Instructions

## 2020-04-16 NOTE — Progress Notes (Signed)
OFFICE NOTE  Chief Complaint:  Follow-up  Primary Care Physician: Fayrene Helper, MD  HPI:  Joanna Brown is a 79 year old female who has a history of mild aortic stenosis with a valve area of approximately 1.7 cm, also a history of dyslipidemia and hypertension, both of which have been well controlled. We are seeing her back for an annual visit today. She reports actually feeling very well, started walking every day, has made major changes to her diet as reflected by a marked decrease in triglycerides. Unfortunately recently she had a mild elevation in liver enzymes slightly above normal on lovastatin. Typically we could tolerate liver enzymes up to 3 times normal on statin medications, however, she was taken off the lovastatin. Either way it is questionable whether she really needs the additional statin at this time and this certainly argues that she should have further workup as to why she had elevated liver enzymes, whether this is due to a new non-alcoholic steatohepatitis or perhaps concomitant medications causing her elevated liver enzymes.  In fact, I reviewed her CT scan today he years ago and she does have steatohepatitis.  Therefore she is not a good candidate for a statin medication. Her blood pressure seems to be well-controlled on her current regimen.  I saw Joanna Brown back in the office today. She is without any new complaints. She occasionally gets some heartburn and is not currently taking medication for that. She denies any chest pain or worsening shortness of breath.  Joanna Brown returns today for follow-up. Again she is without complaints. We discussed her aortic stenosis however she seemed to have little recollection that she has this disorder. I again went over aortic stenosis and the fact that she has mild to moderate narrowing of the aortic valve. This time we are monitoring it clinically. Her last echo was in April of last year. She is asymptomatic. Her blood  pressure is well controlled. She had recent laboratory work which shows an LDL cholesterol of 70 in October which is good control. Her hemoglobin A1c is 7 which is down from 8.3 prior to that. I've encouraged her to continue with this trend is good cholesterol and blood sugar control her helpful in slowing the process of aortic stenosis.  09/12/2016  Joanna Brown was seen today in follow-up. Overall she seems to be doing well. She has no complaints such as shortness of breath or chest pain. EKG is stable showing normal sinus rhythm at 70 with LAFB. Blood pressure is at goal today. She reports her 11 A1c is in the low 7 range. Cholesterol is also been fairly well controlled. She does have a history of moderate aortic stenosis and is due for repeat echo.  09/12/2017  Joanna Brown was seen today in follow-up.  Over the past year she denies any chest pain or worsening shortness of breath.  Unfortunately her hemoglobin A1c is up from the low sevens to 8.2.  Cholesterol also is higher than goal.  Her total cholesterol is 171, HDL 40, LDL 97 and triglycerides 227.  She reports compliance with pravastatin.  She has moderate aortic stenosis with a mean gradient of 20 mmHg based on echo last year and normal LV function.  She did report one brief episode of chest discomfort with more marked exertion a few days ago.  This was right sided underneath the right breast and was a crampy sensation which improved after resting.  I advised that she monitor that further and if she  has more recurrence we may need to consider stress testing.  12/31/2018  Joanna Brown is seen today for routine follow-up.  She was seen in April 2019.  Since then she reports she was doing fairly well up to about 2 weeks ago.  She has had some worsening shortness of breath, particularly at night and while laying down.  She has a mild nonproductive cough.  She also reports some occasional lower extremity edema.  She gets some shortness of breath with  exertion but denies any chest pain, pressure, heaviness or other anginal symptoms.  EKG today shows incomplete right bundle branch pattern.  Her diabetes not well controlled with A1c recently of 8.8.  Her recent cholesterol profile in February showed total cholesterol 145, triglycerides 225, HDL 41 and LDL 73.  She has known aortic stenosis which is at least moderate however not assessed since 2018 by echo.  04/30/2019  Joanna Brown returns today for follow-up.  She had an echo in August 2020 which showed an EF 50 to 55%, moderate LVH, grade 1 diastolic dysfunction.  There was moderate to severe aortic stenosis with a mean gradient of 21 mmHg, AVA by VTI was 1 cm with a dimensionless index of 0.21.  Symptom wise she does report some shortness of breath with exertion but is not consistent.  She has required some pillows to elevate her head at night.  She denies any chest pain at rest.  She has had no presyncopal or syncopal episodes.  10/24/2019  Joanna Brown is seen today in follow-up.  She reports that she has had some chest pain on and off for the past 2 weeks.  She thought initially it might be something that she ate however she has had several episodes with doing house work and other activities that do improve with rest.  She was supposed to have a repeat echo in December and actually is scheduled for repeat echo next week.  On exam today her aortic valve sounds moderately severe.  These could be symptoms associated with the valve.  EKG was personally reviewed shows no new ischemic changes.  11/27/2019  Joanna Brown is seen today in follow-up.  She recently had an echocardiogram to evaluate her aortic stenosis.  As expected the valve is moderately severe.  LVEF is normal with mean gradient 29 mmHg and dimensionless index of 0.26.  Aortic valve area 0.91 cm.  This is more consistent with moderate to severe AS and it appeared visually severely stenotic suggesting possible low-flow low gradient severe aortic  stenosis.  After discussing with her today does not seem like she is having any exertional symptoms, chest pain, heart failure or presyncopal symptoms.  Unfortunately recently she underwent a colonoscopy was found to have a cancerous polyp.  That was removed however a surgical consultation was recommended.  The patient is hesitant with her surgical recommendation and wishes to see another specialist in [place] if possible.  04/16/2020  Ms. Erkkila returns today for follow-up.  She is due for repeat echo for aortic stenosis but not until January when it scheduled.  She denies any recurrent chest pain or worsening shortness of breath.  She says she is fairly active.  Was felt that she might have a more severe aortic stenosis with a low gradient based on her last echo in June.  Apparently no further work-up was performed regarding her colonoscopy which was abnormal.  She never had surgery because the colon was not appropriately marked.  She is supposed to have another colonoscopy.  PMHx:  Past Medical History:  Diagnosis Date  . Blood transfusion    1980  . Diabetes mellitus   . Dysrhythmia   . Female bladder prolapse   . GERD 11/18/2008   Qualifier: Diagnosis of  By: Craige Cotta    . GERD (gastroesophageal reflux disease)   . Heart disease   . Hyperlipidemia   . Hypertension    echo and stress 4/10 reports on chart, EKG ` LOV 9/12 on chart  . Mild aortic stenosis     Past Surgical History:  Procedure Laterality Date  . ABDOMINAL HYSTERECTOMY    . ANTERIOR AND POSTERIOR REPAIR  04/26/2011   Procedure: ANTERIOR (CYSTOCELE) AND POSTERIOR REPAIR (RECTOCELE);  Surgeon: Reece Packer, MD;  Location: [facility];  Service: Urology;  Laterality: N/A;  . CATARACT EXTRACTION Bilateral    with IOL  . CHOLECYSTECTOMY  2009  . COLONOSCOPY N/A 12/02/2013   three colon polyps removed, small internal hemorrhoids. Hyperplastic polyps  . COLONOSCOPY N/A 11/20/2019   pancolonic diverticulosis, two 10-11  mm polyps in ascending colon, one 5 mm polyp in cecum, ascending colon with superficially invasive adenocarcinoma arising in background of sessile serrated polyps with low and high grade cytologic dysplasia.  . COLONOSCOPY W/ POLYPECTOMY    . LEFT HEART CATH  09/10/2008   normal coronary arteries, normal LV systolic function, EF 25% (Dr. [name])  . LYMPH NODE DISSECTION Right 1997   under arm  . NM MYOCAR PERF WALL MOTION  2010   dipyridamole - mild-mod in intenstiy perfusion defect in mid anterior, mid anteroseptal wall, EF 70%  . OVARY SURGERY     bilateral tumors removed  . POLYPECTOMY  11/20/2019   Procedure: POLYPECTOMY;  Surgeon: Daneil Dolin, MD;  Location: [facility];  Service: Endoscopy;;  hot and cold snare cecal polyp, and asending polyps x 2  . THYROIDECTOMY    . THYROIDECTOMY  02/2008  . TRANSTHORACIC ECHOCARDIOGRAM  08/2011   EF=>55%, mild conc LVH; trace MR; mild TR; mild-mod AV calcification with mild valvular AV stenosis  . VAGINAL PROLAPSE REPAIR  04/26/2011   Procedure: VAGINAL VAULT SUSPENSION;  Surgeon: Reece Packer, MD;  Location: [facility];  Service: Urology;  Laterality: N/A;  with Graft  10x6    FAMHx:  Family History  Problem Relation Age of Onset  . Stomach cancer Mother 37  . Heart disease Mother 68       heart disease  . Heart disease Father 64       MI  . Stroke Maternal Grandfather   . Heart attack Paternal Grandfather   . Hypertension Brother   . Bone cancer Brother   . Hypertension Sister   . Liver cancer Sister   . Hypertension Sister   . Hypertension Child   . Colon cancer Neg Hx   . Colon polyps Neg Hx     SOCHx:   reports that she has never smoked. She has never used smokeless tobacco. She reports that she does not drink alcohol and does not use drugs.  ALLERGIES:  Allergies  Allergen Reactions  . Senokot Wheat Bran [Wheat Bran]     ABDOMINAL CRAMPS  . Spironolactone     Stomach problems, vision changes      ROS: Pertinent items noted in HPI and remainder of comprehensive ROS otherwise negative.  HOME MEDS: Current Outpatient Medications  Medication Sig Dispense Refill  . ALPHAGAN P 0.1 % SOLN Apply 1 drop to eye every morning.    Marland Kitchen  amLODipine (NORVASC) 10 MG tablet TAKE 1 TABLET(10 MG) BY MOUTH EVERY MORNING (Patient taking differently: Take 10 mg by mouth daily. TAKE 1 TABLET(10 MG) BY MOUTH EVERY MORNING) 90 tablet 3  . Ascorbic Acid (VITAMIN C) 1000 MG tablet Take 1,000 mg by mouth 4 (four) times a week.    Marland Kitchen aspirin 81 MG tablet Take 81 mg by mouth every other day.     . benazepril (LOTENSIN) 40 MG tablet TAKE 1 TABLET(40 MG) BY MOUTH DAILY 90 tablet 1  . Blood Glucose Monitoring Suppl (ACCU-CHEK GUIDE) w/Device KIT 1 each by Does not apply route 4 (four) times daily. 1 kit 0  . Calcium Carbonate-Vit D-Min (CALCIUM 1200) 1200-1000 MG-UNIT CHEW Chew 1 each by mouth 4 (four) times a week.     . cholecalciferol (VITAMIN D3) 25 MCG (1000 UT) tablet Take 1,000 Units by mouth daily.    . clotrimazole-betamethasone (LOTRISONE) cream Apply twice daily for 1 week, to affected area , then as needed 45 g 0  . ezetimibe (ZETIA) 10 MG tablet TAKE 1 TABLET(10 MG) BY MOUTH DAILY 90 tablet 3  . glipiZIDE (GLUCOTROL XL) 5 MG 24 hr tablet TAKE 2 TABLETS BY MOUTH DAILY WITH BREAKFAST 60 tablet 3  . glucose blood (ACCU-CHEK GUIDE) test strip USE TO CHECK BLOOD SUGAR TWO TIMES DAILY 150 strip 1  . insulin detemir (LEVEMIR FLEXTOUCH) 100 UNIT/ML FlexPen Inject 50 Units into the skin 2 (two) times daily. 30 mL 5  . Insulin Pen Needle (B-D ULTRAFINE III SHORT PEN) 31G X 8 MM MISC USE AS DIRECTED TWO TIMES DAILY WITH INSULIN PENS 100 each 5  . Lancets Ultra Fine MISC 1 each by Does not apply route 4 (four) times daily. 150 each 5  . latanoprost (XALATAN) 0.005 % ophthalmic solution Place 1 drop into both eyes at bedtime.     . metFORMIN (GLUCOPHAGE) 500 MG tablet TAKE 1 TABLET BY MOUTH EVERY DAY WITH BREAKFAST  (Patient taking differently: Take 500 mg by mouth daily with breakfast. TAKE 1 TABLET BY MOUTH EVERY DAY WITH BREAKFAST) 60 tablet 2  . Multiple Vitamins-Minerals (CENTRUM) tablet Take 1 tablet by mouth every morning.     . Omega-3 Fatty Acids (FISH OIL) 1200 MG CAPS Take 1 capsule by mouth every morning.     . pantoprazole (PROTONIX) 40 MG tablet Take 1 tablet (40 mg total) by mouth daily. 30 minutes before meal 90 tablet 3  . pravastatin (PRAVACHOL) 80 MG tablet TAKE 1 TABLET(80 MG) BY MOUTH DAILY 90 tablet 1  . triamterene-hydrochlorothiazide (MAXZIDE) 75-50 MG tablet TAKE 1 TABLET BY MOUTH DAILY 90 tablet 1   No current facility-administered medications for this visit.    LABS/IMAGING: No results found for this or any previous visit (from the past 48 hour(s)). No results found.  VITALS: BP 110/60   Pulse 77   Ht '5\' 5"'  (1.651 m)   Wt 184 lb (83.5 kg)   SpO2 99%   BMI 30.62 kg/m   EXAM: General appearance: alert and no distress Neck: no carotid bruit and no JVD Lungs: clear to auscultation bilaterally Heart: regular rate and rhythm, S1, S2 normal and systolic murmur: late systolic 3/6, crescendo at 2nd left intercostal space Abdomen: soft, non-tender; bowel sounds normal; no masses,  no organomegaly Extremities: extremities normal, atraumatic, no cyanosis or edema Pulses: 2+ and symmetric Skin: Skin color, texture, turgor normal. No rashes or lesions Neurologic: Grossly normal Psych: Mood, affect normal  EKG: Normal sinus rhythm at 77,  LAFB-personally reviewed  ASSESSMENT: 1. Hypertension-controlled 2. Dyslipidemia 3. Moderate to severe aortic stenosis -mean gradient 29 mmHg, AVA 0.91 cm, DI 0.26 (10/2019) 4. NAFLD 5. GERD 6. DM2 on insulin  PLAN: 1.   Joanna. Glandon is not having any further chest pain or worsening shortness of breath.  She has a repeat echo in January and will see if her gradient has worsened.  On exam today it sounds like she has more moderate to  severe aortic stenosis.  It does not sound critical.  She may ultimately need surgery for an abnormal colonoscopy finding.  We will have to consider this with regards to her aortic stenosis and perioperative risk.  Follow-up with me in 6 to 8 months.  [name], MD, [facility], [place] Director of the Advanced Lipid Disorders &  Cardiovascular Risk Reduction Clinic Diplomate of the American Board of Clinical Lipidology Attending Cardiologist  Direct Dial: [phone]  Fax: [phone]  Website:  [email or web address] 04/16/2020, 9:05 AM

## 2020-04-22 ENCOUNTER — Telehealth: Payer: Self-pay | Admitting: *Deleted

## 2020-04-22 NOTE — Telephone Encounter (Signed)
Called [facility] to check in referral. Was advised by Joey they do have referral and will contact patient

## 2020-04-27 ENCOUNTER — Other Ambulatory Visit (HOSPITAL_COMMUNITY): Payer: Medicare Other

## 2020-04-27 ENCOUNTER — Telehealth: Payer: Self-pay | Admitting: *Deleted

## 2020-04-27 ENCOUNTER — Telehealth: Payer: Self-pay | Admitting: Gastroenterology

## 2020-04-27 NOTE — Telephone Encounter (Signed)
We can see her. This case can go to any of the MDs or APPs. She is in need of EGD and colonoscopy to be done at the hospital in light of her comorbidities. We currently have a backlog of cases there and it may be a few months until it is done there, she should be informed of that in advance.

## 2020-04-27 NOTE — Telephone Encounter (Signed)
Sending note to [place] with [facility] to see if she can f/u on referral

## 2020-04-27 NOTE — Telephone Encounter (Signed)
patient referral sent 03/12/20. No contact has still been made with patient for appointment. fowarding to [facility] to see if she can assist with referral

## 2020-04-27 NOTE — Telephone Encounter (Signed)
Hey Dr Havery Moros, this pt is being referred to [place] for Gastro reflux, dysphagia, and history of colon cancer. Pt has seen [facility] within the past year, records are in epic for review. Please advise on scheduling.

## 2020-04-27 NOTE — Telephone Encounter (Signed)
Patient called in. She states she does not remember talking to [place] about her procedure being cancelled. I advised we did cancel and referral sent to [facility]. She also was provided with phone # to the office.

## 2020-04-28 ENCOUNTER — Ambulatory Visit (HOSPITAL_COMMUNITY): Admit: 2020-04-28 | Payer: Medicare Other

## 2020-04-28 ENCOUNTER — Encounter (HOSPITAL_COMMUNITY): Payer: Self-pay

## 2020-04-28 SURGERY — COLONOSCOPY WITH PROPOFOL
Anesthesia: Monitor Anesthesia Care

## 2020-04-28 NOTE — Telephone Encounter (Signed)
Patient scheduled for 05/01/20 at 12:00pm.  Was informed about the backlog of cases.

## 2020-04-29 ENCOUNTER — Other Ambulatory Visit: Payer: Self-pay | Admitting: "Endocrinology

## 2020-04-30 ENCOUNTER — Other Ambulatory Visit: Payer: Self-pay | Admitting: "Endocrinology

## 2020-05-01 ENCOUNTER — Ambulatory Visit: Payer: Medicare Other | Admitting: Physician Assistant

## 2020-05-05 ENCOUNTER — Ambulatory Visit (INDEPENDENT_AMBULATORY_CARE_PROVIDER_SITE_OTHER): Payer: Medicare Other | Admitting: Nurse Practitioner

## 2020-05-05 ENCOUNTER — Other Ambulatory Visit: Payer: Self-pay

## 2020-05-05 ENCOUNTER — Encounter: Payer: Self-pay | Admitting: Nurse Practitioner

## 2020-05-05 ENCOUNTER — Other Ambulatory Visit (INDEPENDENT_AMBULATORY_CARE_PROVIDER_SITE_OTHER): Payer: Medicare Other

## 2020-05-05 VITALS — BP 120/78 | HR 73 | Ht 65.0 in | Wt 180.0 lb

## 2020-05-05 DIAGNOSIS — K5900 Constipation, unspecified: Secondary | ICD-10-CM

## 2020-05-05 DIAGNOSIS — K219 Gastro-esophageal reflux disease without esophagitis: Secondary | ICD-10-CM | POA: Diagnosis not present

## 2020-05-05 DIAGNOSIS — Z86010 Personal history of colonic polyps: Secondary | ICD-10-CM

## 2020-05-05 LAB — BASIC METABOLIC PANEL
BUN: 15 mg/dL (ref 6–23)
CO2: 33 mEq/L — ABNORMAL HIGH (ref 19–32)
Calcium: 10.3 mg/dL (ref 8.4–10.5)
Chloride: 94 mEq/L — ABNORMAL LOW (ref 96–112)
Creatinine, Ser: 1.04 mg/dL (ref 0.40–1.20)
GFR: 51.53 mL/min — ABNORMAL LOW (ref >60.00)
Glucose, Bld: 104 mg/dL — ABNORMAL HIGH (ref 70–99)
Potassium: 3.7 mEq/L (ref 3.5–5.1)
Sodium: 134 mEq/L — ABNORMAL LOW (ref 135–145)

## 2020-05-05 LAB — CBC
HCT: 39.1 % (ref 36.0–46.0)
Hemoglobin: 13.7 g/dL (ref 12.0–15.0)
MCHC: 35.1 g/dL (ref 30.0–36.0)
MCV: 83 fl (ref 78.0–100.0)
Platelets: 228 10*3/uL (ref 150.0–400.0)
RBC: 4.71 Mil/uL (ref 3.87–5.11)
RDW: 13.6 % (ref 11.5–15.5)
WBC: 8.7 10*3/uL (ref 4.0–10.5)

## 2020-05-05 MED ORDER — BISACODYL EC 5 MG PO TBEC: DELAYED_RELEASE_TABLET | ORAL | 0 refills | Status: DC

## 2020-05-05 MED ORDER — POLYETHYLENE GLYCOL 3350 17 G PO PACK: 17.0000 g | PACK | Freq: Every day | ORAL | 0 refills | Status: DC

## 2020-05-05 NOTE — Progress Notes (Signed)
05/05/2020 Joanna Brown [id number] August 20, 1940   CHIEF COMPLAINT: EGD and colonoscopy  HISTORY OF PRESENT ILLNESS: Joanna Brown is a 79 year old female with a past medical history of arthritis, hypertension, aortic stenosis,  hyperlipidemia, diabetes mellitus type 2, CKD, GERD and colon polyps. Past abdominal hysterectomy, partial thyroidectomy (benign nodules), cystocele and rectocele repair. She presents to our office today as referred by Dr. Gala Romney at [facility] to schedule an EGD and colonoscopy at [facility] due to being a high risk procedure with moderate to severe aortic stenosis.   She underwent a colonoscopy by Dr. Gala Romney 11/20/2019 due to having a positive Cologuard test.  The colonoscopy identified two 10 to 11 mm polyps in the ascending colon and a 5 mm polyp to the cecum. Biopsies of the ascending colon polyps were suspicious for superficial invasive adenocarcinoma arising in the background of sessile serrated polyps with low and high-grade dysplasia.  The cecal polyp was inflammatory polyp.  CEA level 2.4.  She was referred to colorectal surgeon Dr. Leighton Ruff who advised a repeat colonoscopy in 6 to 12 months for reevaluation of the ascending colon polypectomy site with recommendations to tattoo if regrowth noted which would be required if a right hemicolectomy pursued.  An abdominal/pelvic CT scan 12/05/2019 showed a nonspecific 4 mm right peritoneal soft tissue nodule inferior to the right liver lobe was noted, possibly suggestive of peritoneal carcinomatosis.  A tiny 2 mm left lower lobe pulmonary nodule was noted.  Subtle morphologic changes suggestive of cirrhosis were present.  A repeat CTAP was completed 03/23/2020 which showed a 4 mm nodularity along the right upper quadrant omentum adjacent to the right hepatic lobe was stable.  The 2 mm left lower lung nodules were unchanged.  She has constipation. She reports passing one BM once or twice weekly for the past 5 or 6 years. She take  Dulcolax tabs 2 every 2 to 3 days until she passes a BM. Rarely uses a fleet enema. She used a fleet enema last night and she passed a large amount of lumpy brown stool. No black stools or rectal bleeding. She has a history of heartburn which worse in July and August 2021. At that time, she had heartburn every time she ate. She started taking Pantoprazole 78m daily around September 2021 for one month and her heartburn abated. Since then, she is taking Pantoprazole 478mtwice weekly.  No recent heartburn.  No dysphagia.  She denies ever having an EGD.  No family history of esophageal, gastric or colon cancer.  Her sister had liver cancer.  No other complaints at this time.  Her husband is present.  She has a history of of aortic stenosis.  She was last evaluated by her cardiologist Dr. KeLyman Bishopn 04/16/2020.  At that time, she denied having any chest pain or change in her baseline shortness of breath.  Dr. HiDebara Pickettuspected her aortic stenosis has progressed to moderate to severe.  He planned to repeat an echo in January 2022.  She will most likely require AVR surgery in the future.  Colonoscopy 11/20/2019 by Dr. RoGala Romneyue to having a positive Cologuard test: - Diverticulosis in the entire examined colon. - Two 10 to 11 mm polyps in the ascending colon, removed with a hot snare. Resected and retrieved. - One 5 mm polyp in the cecum, removed with a cold snare. Resected and retrieved. Redundant colon. - The examination was otherwise normal on direct and retroflexion views. -Of note, patient states  Linzess 290 daily did not help at all with her constipation. She was very well prep for her colonoscopy today, however. Biopsy report: A. COLON, ASCENDING, POLYPECTOMY:  - Foci suspicious for at least superficially invasive adenocarcinoma  arising in the background of sessile serrated polyps with low and high  grade cytologic dysplasia.  B. COLON, CECUM, POLYPECTOMY:  - Inflammatory polyp.    Colonoscopy by Dr. Jerre Simon 12/03/2013: 1. Three COLON POLYPS REMOVED 2. The [place]/TC colon ARE as redundant 3. Small internal hemorrhoids -Biopsy report: 1. Colon, polyp(s), descending - FRAGMENTS OF HYPERPLASTIC POLYPS - MELANOSIS COLI. 2. Colon, polyp(s), sigmoid - HYPERPLASTIC POLYP (X1). - MELANOSIS COLI.  CTAP 03/23/2020: 1. Stable 4 mm focus of nodularity along the right upper quadrant omentum adjacent to the right hepatic lobe. This was probably present back on 05/01/2011 along with adjacent ascites, and accordingly is probably not from cancer. Some no new or progressive omental nodularity. 2. There are a couple of 2 mm nodules at the lung bases which are unchanged. 3. Other imaging findings of potential clinical significance: Coronary atherosclerosis. Calcification of the mitral valve. Prominent stool throughout the colon favors constipation. Degenerative disc disease at L4-5 with left greater than right foraminal impingement at this level. Small umbilical hernia contains adipose tissue. 4. Aortic atherosclerosis. Aortic Atherosclerosis   Echo 10/29/2019: 1. Moderate to severe aortic stenosis is present (Vmax 3.6 m/s, MG 29 mmHG, DI 0.26, AVA 0.91 cm2). Normval LVEF and SVI 39 cc/m2 on this study. Velocity, MG, DI suggest moderate AS, and SVI would support this as well. Visually, the valve is severely stenotic. There have been concerns for paradoxical low-flow low-gradient AS in the past. If there are clinical concerns for severe AS, would recommend an aortic valve calcium score for clarification of severity. The aortic valve is tricuspid. Aortic valve regurgitation is not visualized. Moderate to severe aortic valve stenosis. 2. Left ventricular ejection fraction, by estimation, is 60 to 65%. The left ventricle has normal function. The left ventricle has no regional wall motion abnormalities. There is mild concentric left ventricular hypertrophy. Left ventricular  diastolic function could not be evaluated. 3. Right ventricular systolic function is normal. The right ventricular size is normal. There is normal pulmonary artery systolic pressure. The estimated right ventricular systolic pressure is 10.6 mmHg. 4. Left atrial size was mildly dilated. 5. The mitral valve is degenerative. Trivial mitral valve regurgitation. Mild mitral stenosis. The mean mitral valve gradient is 3.2 mmHg with average heart rate of 64 bpm.  Past Medical History:  Diagnosis Date  . Blood transfusion    1980  . Diabetes mellitus   . Dysrhythmia   . Female bladder prolapse   . GERD 11/18/2008   Qualifier: Diagnosis of  By: Craige Cotta    . GERD (gastroesophageal reflux disease)   . Heart disease   . Hyperlipidemia   . Hypertension    echo and stress 4/10 reports on chart, EKG ` LOV 9/12 on chart  . Mild aortic stenosis    Past Surgical History:  Procedure Laterality Date  . ABDOMINAL HYSTERECTOMY    . ANTERIOR AND POSTERIOR REPAIR  04/26/2011   Procedure: ANTERIOR (CYSTOCELE) AND POSTERIOR REPAIR (RECTOCELE);  Surgeon: Reece Packer, MD;  Location: [facility];  Service: Urology;  Laterality: N/A;  . CATARACT EXTRACTION Bilateral    with IOL  . CHOLECYSTECTOMY  2009  . COLONOSCOPY N/A 12/02/2013   three colon polyps removed, small internal hemorrhoids. Hyperplastic polyps  . COLONOSCOPY N/A 11/20/2019  pancolonic diverticulosis, two 10-11 mm polyps in ascending colon, one 5 mm polyp in cecum, ascending colon with superficially invasive adenocarcinoma arising in background of sessile serrated polyps with low and high grade cytologic dysplasia.  . COLONOSCOPY W/ POLYPECTOMY    . LEFT HEART CATH  09/10/2008   normal coronary arteries, normal LV systolic function, EF 76% (Dr. [name])  . LYMPH NODE DISSECTION Right 1997   under arm  . NM MYOCAR PERF WALL MOTION  2010   dipyridamole - mild-mod in intenstiy perfusion defect in mid anterior, mid anteroseptal wall, EF  70%  . OVARY SURGERY     bilateral tumors removed  . POLYPECTOMY  11/20/2019   Procedure: POLYPECTOMY;  Surgeon: Daneil Dolin, MD;  Location: [facility];  Service: Endoscopy;;  hot and cold snare cecal polyp, and asending polyps x 2  . THYROIDECTOMY    . THYROIDECTOMY  02/2008  . TRANSTHORACIC ECHOCARDIOGRAM  08/2011   EF=>55%, mild conc LVH; trace MR; mild TR; mild-mod AV calcification with mild valvular AV stenosis  . VAGINAL PROLAPSE REPAIR  04/26/2011   Procedure: VAGINAL VAULT SUSPENSION;  Surgeon: Reece Packer, MD;  Location: [facility];  Service: Urology;  Laterality: N/A;  with Graft  10x6    Social History: She is married.  She is retired.  She has 1 son.  She is a non-smoker.  No alcohol use.  No drug use.  Family History: Father with heart disease, died age age 40 MI. Mother with history of heart disease and pancreatic cancer died age 15. Sister with history of liver cancer. Sister with history of bone cancer.    Allergies  Allergen Reactions  . Senokot Wheat Bran [Wheat Bran]     ABDOMINAL CRAMPS  . Spironolactone     Stomach problems, vision changes       Outpatient Encounter Medications as of 05/05/2020  Medication Sig  . ALPHAGAN P 0.1 % SOLN Apply 1 drop to eye every morning.  Marland Kitchen amLODipine (NORVASC) 10 MG tablet TAKE 1 TABLET(10 MG) BY MOUTH EVERY MORNING (Patient taking differently: Take 10 mg by mouth daily. TAKE 1 TABLET(10 MG) BY MOUTH EVERY MORNING)  . Ascorbic Acid (VITAMIN C) 1000 MG tablet Take 1,000 mg by mouth 4 (four) times a week.  Marland Kitchen aspirin 81 MG tablet Take 81 mg by mouth every other day.   . benazepril (LOTENSIN) 40 MG tablet TAKE 1 TABLET(40 MG) BY MOUTH DAILY  . Blood Glucose Monitoring Suppl (ACCU-CHEK GUIDE) w/Device KIT 1 each by Does not apply route 4 (four) times daily.  . Calcium Carbonate-Vit D-Min (CALCIUM 1200) 1200-1000 MG-UNIT CHEW Chew 1 each by mouth 4 (four) times a week.   . cholecalciferol (VITAMIN D3) 25 MCG (1000 UT) tablet  Take 1,000 Units by mouth daily.  . clotrimazole-betamethasone (LOTRISONE) cream Apply twice daily for 1 week, to affected area , then as needed  . ezetimibe (ZETIA) 10 MG tablet TAKE 1 TABLET(10 MG) BY MOUTH DAILY  . glipiZIDE (GLUCOTROL XL) 5 MG 24 hr tablet TAKE 2 TABLETS BY MOUTH DAILY WITH BREAKFAST  . glucose blood (ACCU-CHEK GUIDE) test strip USE TO CHECK BLOOD SUGAR TWO TIMES DAILY  . insulin detemir (LEVEMIR FLEXTOUCH) 100 UNIT/ML FlexPen Inject 50 Units into the skin 2 (two) times daily.  . Insulin Pen Needle (B-D ULTRAFINE III SHORT PEN) 31G X 8 MM MISC USE AS DIRECTED TWO TIMES DAILY WITH INSULIN PENS  . Lancets Ultra Fine MISC 1 each by Does not  apply route 4 (four) times daily.  Marland Kitchen latanoprost (XALATAN) 0.005 % ophthalmic solution Place 1 drop into both eyes at bedtime.   . metFORMIN (GLUCOPHAGE) 500 MG tablet TAKE 1 TABLET BY MOUTH EVERY DAY WITH BREAKFAST  . Multiple Vitamins-Minerals (CENTRUM) tablet Take 1 tablet by mouth every morning.   . Omega-3 Fatty Acids (FISH OIL) 1200 MG CAPS Take 1 capsule by mouth every morning.   . pantoprazole (PROTONIX) 40 MG tablet Take 1 tablet (40 mg total) by mouth daily. 30 minutes before meal  . pravastatin (PRAVACHOL) 80 MG tablet TAKE 1 TABLET(80 MG) BY MOUTH DAILY  . triamterene-hydrochlorothiazide (MAXZIDE) 75-50 MG tablet TAKE 1 TABLET BY MOUTH DAILY   No facility-administered encounter medications on file as of 05/05/2020.   REVIEW OF SYSTEMS:  Gen: Denies fever, sweats or chills. No weight loss.  CV: Denies chest pain, palpitations or edema.  See HPI. Resp: Some SOB, " breaths slow at times".  GI: Denies heartburn, dysphagia, stomach or lower abdominal pain. No diarrhea or constipation.  GU : Denies urinary burning, blood in urine, increased urinary frequency or incontinence. MS: + arthritis.  Derm: Denies rash, itchiness, skin lesions or unhealing ulcers. Psych: Denies depression, anxiety or confusion.  Heme: Denies bruising,  bleeding. Neuro:  Denies headaches, dizziness or paresthesias. Endo:  + DM II. Past partial thyroidectomy.   PHYSICAL EXAM: BP 120/78   Pulse 73   Ht 5' 5" (1.651 m)   Wt 81.6 kg   SpO2 98%   BMI 29.95 kg/m   General: Well developed 79 year old female in no acute distress. Head: Normocephalic and atraumatic. Eyes:  Sclerae non-icteric, conjunctive pink. Ears: Normal auditory acuity. Mouth: Upper and lower partial dentures. No ulcers or lesions.  Neck: Supple, no lymphadenopathy or thyromegaly.  Lungs: Clear bilaterally to auscultation without wheezes, crackles or rhonchi. Heart: Regular rate and rhythm. 3/6 systolic murmur. No rub or gallop appreciated.  Abdomen: Soft, nontender, non distended. No masses. No hepatosplenomegaly. Normoactive bowel sounds x 4 quadrants.  Rectal: Deferred.  Musculoskeletal: Symmetrical with no gross deformities. Skin: Warm and dry. No rash or lesions on visible extremities. Extremities: No edema. Neurological: Alert oriented x 4, no focal deficits.  Psychological:  Alert and cooperative. Normal mood and affect.  ASSESSMENT AND PLAN:  16.  79 year old female s/p colonoscopy by Dr. Gala Romney 11/2019 which identified a superficial invasive adenocarcinoma arising in background of a sessile serrated polyp in the ascending colon. CEA 2.4. Chest/abd/pelvic CT without evidence of metastatic disease. She is requiring a repeat colonoscopy with tattoo placement if polyp regrowth present prior to pursing a right hemicolectomy. Colonoscopy at [facility] due to high risk procedure with moderate to severe AS. -CBC, BMP -Colonoscopy benefits and risks discussed including risk with sedation, risk of bleeding, perforation and infection  -Patient will take MiraLAX nightly.  She will take Dulcolax 2 tabs for 2 nights prior to her colonoscopy prep date. -Further recommendations to be determined after colonoscopy results completed  2. GERD -Continue Pantoprazole 74m QD -[facility] time of colonoscopy. EGD benefits and risks discussed including risk with sedation, risk of bleeding, perforation and infection   3. Chronic constipation, unresponsive to Linzess -Miralax Q HS -Dulcolax 2 tabs every 3rd night as needed.  3. Aortic stenosis Moderate to severe aortic stenosis -mean gradient 29 mmHg, AVA 0.91 cm, DI 0.26 (10/2019). Repeat ECHO 05/2020.  4. CTAP showed evidence of possible cirrhosis. Normal LFTs. Last plt count in Epic 209 Feb. 2020. -Continue  follow up with Dr. Gala Romney and Roseanne Kaufman at [facility]  5. CTAP showed a 49m stable nodularity along the RUQ adjacent to the right hepatic lobe (see CTAP report above, unlikely from cancerous process)  6. Two 279mpulmonary nodules to the lung bases -Follow up with PCP        CC:  SiFayrene HelperMD

## 2020-05-05 NOTE — Progress Notes (Signed)
____________________________________________________________  Attending physician addendum:  Thank you for sending this case to me. I have reviewed the entire note and agree with the plan.  Reports indicate very challenging procedure due to redundant anatomy.  With that and chronic constipation, please arrange this with a 2-day preparation.  As it stands now, I have one procedure slot left on my upcoming date in January.  If that will accommodate both EGD and colonoscopy, then this procedure can be put there.  If that slot will only accommodate colonoscopy, then we will just do the colonoscopy that day.  Her upper endoscopy is less strongly and urgently indicated for reflux.  Wilfrid Lund, MD  ____________________________________________________________

## 2020-05-05 NOTE — Patient Instructions (Addendum)
If you are age 78 or older, your body mass index should be between 23-30. Your Body mass index is 29.95 kg/m. If this is out of the aforementioned range listed, please consider follow up with your Primary Care Provider.  If you are age 88 or younger, your body mass index should be between 19-25. Your Body mass index is 29.95 kg/m. If this is out of the aformentioned range listed, please consider follow up with your Primary Care Provider.    We need to schedule you for an endoscopy and colonoscopy at the hospital however there are no openings at this time. We will call you when a date becomes available to get you scheduled.  You will need to be Covid tested and then quarantine 4 days before the procedure.  We advise that you take 2 Dulcolax tablets for 2 nights prior to starting your colonoscopy prep.   Please purchase the following medications over the counter and take as directed: Miralax: Take as directed, daily at bedtime Dulcolax: Take 2 tablets by mouth every 3 nights  Please go to the lab in the basement of our building to have lab work done as you leave today. Hit "B" for basement when you get on the elevator.  When the doors open the lab is on your left.  We will call you with the results. Thank you.  Due to recent changes in healthcare laws, you may see the results of your imaging and laboratory studies on MyChart before your provider has had a chance to review them.  We understand that in some cases there may be results that are confusing or concerning to you. Not all laboratory results come back in the same time frame and the provider may be waiting for multiple results in order to interpret others.  Please give [place] 48 hours in order for your provider to thoroughly review all the results before contacting the office for clarification of your results.    Thank you for entrusting me with your care and for choosing [place], [place]

## 2020-05-06 ENCOUNTER — Telehealth: Payer: Self-pay

## 2020-05-06 NOTE — Progress Notes (Signed)
Joanna Brown has notified Pre-visit of the need for a 2 day prep.

## 2020-05-06 NOTE — Telephone Encounter (Signed)
Patient has been scheduled for a pre-visit on Monday 05/11/2020 at 10:30 AM, pt is aware that she needs to check in on the 3rd floor. COVID test is scheduled for Thursday, 05/21/2020 at 1:30 PM. Hospital schedule only allotted for 1 procedure on 05/25/20, colonoscopy is scheduled for Monday, 05/25/2020 at 11:15 AM, pt is to arrive at 9:45 AM. Patient verbalized understanding of all information and had no concerns at the end of the call.

## 2020-05-06 NOTE — Telephone Encounter (Signed)
-----   Message from Noralyn Pick, NP sent at 05/05/2020  4:24 PM EST ----- Herbert Seta, here is the addendum to today's office note. Refer to staff msg I just sent. Per prior msg please contact patient to schedule a EGD and colonoscopy with Dr. Loletha Carrow at [facility] on Jan 10,2022, if only one slot available then just a colonoscopy. Patient will need a two day bowel prep. Thx  ----- Message ----- From: Doran Stabler, MD Sent: 05/05/2020   2:24 PM EST To: Noralyn Pick, NP    ----- Message ----- From: Noralyn Pick, NP Sent: 05/05/2020  12:22 PM EST To: Doran Stabler, MD

## 2020-05-11 ENCOUNTER — Other Ambulatory Visit: Payer: Self-pay

## 2020-05-11 ENCOUNTER — Ambulatory Visit (AMBULATORY_SURGERY_CENTER): Payer: Self-pay | Admitting: *Deleted

## 2020-05-11 VITALS — Ht 65.0 in | Wt 183.8 lb

## 2020-05-11 DIAGNOSIS — Z01818 Encounter for other preprocedural examination: Secondary | ICD-10-CM

## 2020-05-11 DIAGNOSIS — Z85038 Personal history of other malignant neoplasm of large intestine: Secondary | ICD-10-CM

## 2020-05-11 NOTE — Progress Notes (Signed)
covid test 05-22-19 at 1:30 pm   No trouble with anesthesia, difficulty with intubation or hx/fam hx of malignant hyperthermia per pt   No egg or soy allergy  No home oxygen use   No medications for weight loss taken  emmi information given  2 day Miralax/Miralax prep given

## 2020-05-12 NOTE — Telephone Encounter (Signed)
Spoke with pt and let her know the hospital prefers if you have the test at their location so they can get the results back in time for the procedure. Pt verbalized understanding.

## 2020-05-12 NOTE — Telephone Encounter (Signed)
Patient is calling wanting to know if she can go to [facility] for her covid test

## 2020-05-20 ENCOUNTER — Other Ambulatory Visit: Payer: Self-pay

## 2020-05-20 ENCOUNTER — Telehealth: Payer: Self-pay

## 2020-05-20 DIAGNOSIS — K219 Gastro-esophageal reflux disease without esophagitis: Secondary | ICD-10-CM

## 2020-05-20 NOTE — Telephone Encounter (Signed)
Lm on vm for patient to return call 

## 2020-05-20 NOTE — Telephone Encounter (Signed)
Spoke with patient, she states that she is agreeable to add on EGD. Advised that COVID test and procedure date would be the same, the only thing that will be changing is the procedure start time.  EGD has been added to colonoscopy at [facility] on 05/25/2020, start time is now 10:15 AM, patient will need to arrive at 8:45 AM. Ambulatory referral has been entered in epic for EGD. CASE # [id number]  Called patient with updated appointment time, discussed that she will need to adjust prep time the day of her procedure by an hour. Patient verbalized understanding of all information and had no other concerns at the end of the call.

## 2020-05-20 NOTE — Telephone Encounter (Signed)
-----   Message from Charlie Pitter III, MD sent at 05/20/2020 11:51 AM EST ----- Regarding: RE: [facility] schedule change Thanks for the note, I was just going to send you something.  I was only waiting on a patient from this morning who turns out not to need another endoscopy at this time.  Yes, please add on an upper endoscopy to Ms. Linquist's colonoscopy at [facility] next week as long as she is agreeable.  Orders would need to be updated and I will update the consent when I see her the day of the procedure.  - HD ----- Message ----- From: Missy Sabins, RN Sent: 05/20/2020  11:46 AM EST To: Sherrilyn Rist, MD Subject: RE: [facility] schedule change                         Okay to proceed with adding EGD at [facility] on 05/25/2020?  ----- Message ----- From: Sherrilyn Rist, MD Sent: 05/17/2020   5:02 AM EST To: Missy Sabins, RN Subject: [facility] schedule change                             Nyeli Holtmeyer,   This patient was recently seen in clinic , sent by the [facility] b/c high risk for scopes due to cardiac condition.  Collen's note is available for review if necessary.  She is on for colonoscopy with me Jan 10th at 11:15 The original request was for EGD and colonoscopy, but I believe the schedule would only allow colon, and the EGD was less urgent. Since the patient before her canceled, please move this patient's case earlier and make it an EGD and colonoscopy. (The EGD is for GERD)  Contact patient and scheduling to ensure new plan is acceptable.  Also, see my addendum to clinic note and be sure patient has a 2-day colon prep.  Thanks,  HD

## 2020-05-21 ENCOUNTER — Other Ambulatory Visit (HOSPITAL_COMMUNITY)
Admission: RE | Admit: 2020-05-21 | Discharge: 2020-05-21 | Disposition: A | Discharge status: Home / Self Care | Payer: Medicare Other | Source: Ambulatory Visit | Attending: Gastroenterology | Admitting: Gastroenterology

## 2020-05-21 ENCOUNTER — Other Ambulatory Visit: Payer: Self-pay | Admitting: "Endocrinology

## 2020-05-21 ENCOUNTER — Other Ambulatory Visit: Payer: Self-pay | Admitting: Family Medicine

## 2020-05-21 DIAGNOSIS — Z01818 Encounter for other preprocedural examination: Secondary | ICD-10-CM | POA: Insufficient documentation

## 2020-05-21 DIAGNOSIS — Z20822 Contact with and (suspected) exposure to covid-19: Secondary | ICD-10-CM | POA: Insufficient documentation

## 2020-05-21 LAB — SARS CORONAVIRUS 2 (TAT 6-24 HRS): SARS Coronavirus 2: NEGATIVE

## 2020-05-25 ENCOUNTER — Encounter (HOSPITAL_COMMUNITY)
Admission: RE | Disposition: A | Discharge status: Home / Self Care | Payer: Self-pay | Source: Home / Self Care | Attending: Gastroenterology

## 2020-05-25 ENCOUNTER — Ambulatory Visit (HOSPITAL_COMMUNITY): Payer: Medicare Other | Admitting: Certified Registered Nurse Anesthetist

## 2020-05-25 ENCOUNTER — Ambulatory Visit (HOSPITAL_COMMUNITY)
Admission: RE | Admit: 2020-05-25 | Discharge: 2020-05-25 | Disposition: A | Discharge status: Home / Self Care | Payer: Medicare Other | Attending: Gastroenterology | Admitting: Gastroenterology

## 2020-05-25 ENCOUNTER — Encounter (HOSPITAL_COMMUNITY): Payer: Self-pay | Admitting: Gastroenterology

## 2020-05-25 ENCOUNTER — Other Ambulatory Visit: Payer: Self-pay

## 2020-05-25 DIAGNOSIS — K5900 Constipation, unspecified: Secondary | ICD-10-CM | POA: Diagnosis not present

## 2020-05-25 DIAGNOSIS — Z794 Long term (current) use of insulin: Secondary | ICD-10-CM | POA: Insufficient documentation

## 2020-05-25 DIAGNOSIS — K2950 Unspecified chronic gastritis without bleeding: Secondary | ICD-10-CM | POA: Diagnosis not present

## 2020-05-25 DIAGNOSIS — Q438 Other specified congenital malformations of intestine: Secondary | ICD-10-CM | POA: Insufficient documentation

## 2020-05-25 DIAGNOSIS — K3189 Other diseases of stomach and duodenum: Secondary | ICD-10-CM | POA: Diagnosis not present

## 2020-05-25 DIAGNOSIS — Z1211 Encounter for screening for malignant neoplasm of colon: Secondary | ICD-10-CM | POA: Diagnosis not present

## 2020-05-25 DIAGNOSIS — Z86010 Personal history of colonic polyps: Secondary | ICD-10-CM

## 2020-05-25 DIAGNOSIS — Z888 Allergy status to other drugs, medicaments and biological substances status: Secondary | ICD-10-CM | POA: Insufficient documentation

## 2020-05-25 DIAGNOSIS — I8500 Esophageal varices without bleeding: Secondary | ICD-10-CM | POA: Diagnosis not present

## 2020-05-25 DIAGNOSIS — I8510 Secondary esophageal varices without bleeding: Secondary | ICD-10-CM

## 2020-05-25 DIAGNOSIS — K317 Polyp of stomach and duodenum: Secondary | ICD-10-CM | POA: Insufficient documentation

## 2020-05-25 DIAGNOSIS — K5730 Diverticulosis of large intestine without perforation or abscess without bleeding: Secondary | ICD-10-CM | POA: Insufficient documentation

## 2020-05-25 DIAGNOSIS — Z85038 Personal history of other malignant neoplasm of large intestine: Secondary | ICD-10-CM

## 2020-05-25 DIAGNOSIS — K219 Gastro-esophageal reflux disease without esophagitis: Secondary | ICD-10-CM | POA: Insufficient documentation

## 2020-05-25 HISTORY — PX: BIOPSY: SHX5522

## 2020-05-25 HISTORY — PX: ESOPHAGOGASTRODUODENOSCOPY (EGD) WITH PROPOFOL: SHX5813

## 2020-05-25 HISTORY — PX: COLONOSCOPY WITH PROPOFOL: SHX5780

## 2020-05-25 LAB — GLUCOSE, CAPILLARY: Glucose-Capillary: 148 mg/dL — ABNORMAL HIGH (ref 70–99)

## 2020-05-25 SURGERY — COLONOSCOPY WITH PROPOFOL
Anesthesia: Monitor Anesthesia Care

## 2020-05-25 MED ORDER — LACTATED RINGERS IV SOLN: INTRAVENOUS | Status: DC

## 2020-05-25 MED ORDER — PROPOFOL 500 MG/50ML IV EMUL
INTRAVENOUS | Status: DC | PRN
  Administered 2020-05-25: 150 ug/kg/min via INTRAVENOUS

## 2020-05-25 MED ORDER — PHENYLEPHRINE HCL-NACL 10-0.9 MG/250ML-% IV SOLN
INTRAVENOUS | Status: DC | PRN
  Administered 2020-05-25: 50 ug/min via INTRAVENOUS

## 2020-05-25 MED ORDER — LACTATED RINGERS IV SOLN: INTRAVENOUS | Status: DC | PRN

## 2020-05-25 MED ORDER — SODIUM CHLORIDE 0.9 % IV SOLN: INTRAVENOUS | Status: DC

## 2020-05-25 MED ORDER — PHENYLEPHRINE 40 MCG/ML (10ML) SYRINGE FOR IV PUSH (FOR BLOOD PRESSURE SUPPORT)
PREFILLED_SYRINGE | INTRAVENOUS | Status: DC | PRN
  Administered 2020-05-25: 120 ug via INTRAVENOUS
  Administered 2020-05-25: 200 ug via INTRAVENOUS

## 2020-05-25 MED ORDER — PROPOFOL 500 MG/50ML IV EMUL
INTRAVENOUS | Status: DC | PRN
  Administered 2020-05-25: 50 mg via INTRAVENOUS

## 2020-05-25 SURGICAL SUPPLY — 24 items

## 2020-05-25 NOTE — Anesthesia Preprocedure Evaluation (Signed)
Anesthesia Evaluation  Patient identified by MRN, date of birth, ID band Patient awake    Reviewed: NPO status , Patient's Chart, lab work & pertinent test results  Airway Mallampati: II  TM Distance: >3 FB Neck ROM: Full    Dental  (+) Teeth Intact, Caps   Pulmonary neg pulmonary ROS,    Pulmonary exam normal        Cardiovascular hypertension, Pt. on medications + Valvular Problems/Murmurs AS  Rhythm:Regular Rate:Normal + Systolic murmurs    Neuro/Psych negative neurological ROS  negative psych ROS   GI/Hepatic Neg liver ROS, GERD  Medicated and Controlled,Colon polyps   Endo/Other  diabetes, Well Controlled, Type 2, Oral Hypoglycemic Agents, Insulin Dependent  Renal/GU   negative genitourinary   Musculoskeletal  (+) Arthritis , Osteoarthritis,    Abdominal (+)  Abdomen: soft. Bowel sounds: normal.  Peds  Hematology negative hematology ROS (+)   Anesthesia Other Findings   Reproductive/Obstetrics                             Anesthesia Physical Anesthesia Plan  ASA: III  Anesthesia Plan: MAC   Post-op Pain Management:    Induction: Intravenous  PONV Risk Score and Plan: 2 and Propofol infusion  Airway Management Planned: Simple Face Mask, Natural Airway and Nasal Cannula  Additional Equipment: None  Intra-op Plan:   Post-operative Plan:   Informed Consent: I have reviewed the patients History and Physical, chart, labs and discussed the procedure including the risks, benefits and alternatives for the proposed anesthesia with the patient or authorized representative who has indicated his/her understanding and acceptance.     Dental advisory given  Plan Discussed with: CRNA  Anesthesia Plan Comments: (ECHO 06/21: 1. Moderate to severe aortic stenosis is present (Vmax 3.6 m/s, MG 29  mmHG, DI 0.26, AVA 0.91 cm2). Normval LVEF and SVI 39 cc/m2 on this study.  Velocity,  MG, DI suggest moderate AS, and SVI would support this as well.  Visually, the valve is severely  stenotic. There have been concerns for paradoxical low-flow low-gradient  AS in the past. If there are clinical concerns for severe AS, would  recommend an aortic valve calcium score for clarification of severity. The  aortic valve is tricuspid. Aortic  valve regurgitation is not visualized. Moderate to severe aortic valve  stenosis.  2. Left ventricular ejection fraction, by estimation, is 60 to 65%. The  left ventricle has normal function. The left ventricle has no regional  wall motion abnormalities. There is mild concentric left ventricular  hypertrophy. Left ventricular diastolic  function could not be evaluated.  3. Right ventricular systolic function is normal. The right ventricular  size is normal. There is normal pulmonary artery systolic pressure. The  estimated right ventricular systolic pressure is 37.3 mmHg.  4. Left atrial size was mildly dilated.  5. The mitral valve is degenerative. Trivial mitral valve regurgitation.  Mild mitral stenosis. The mean mitral valve gradient is 3.2 mmHg with  average heart rate of 64 bpm.  Lab Results      Component                Value               Date                      WBC  8.7                 05/05/2020                HGB                      13.7                05/05/2020                HCT                      39.1                05/05/2020                MCV                      83.0                05/05/2020                PLT                      228.0               05/05/2020           Lab Results      Component                Value               Date                      NA                       134 (L)             05/05/2020                K                        3.7                 05/05/2020                CO2                      33 (H)              05/05/2020                GLUCOSE                   104 (H)             05/05/2020                BUN                      15                  05/05/2020                CREATININE               1.04  05/05/2020                CALCIUM                  10.3                05/05/2020                GFRNONAA                 57 (L)              07/30/2019                GFRAA                    66                  07/30/2019          )        Anesthesia Quick Evaluation

## 2020-05-25 NOTE — Interval H&P Note (Signed)
History and Physical Interval Note:  05/25/2020 10:24 AM  Joanna Brown  has presented today for surgery, with the diagnosis of history of adenomatous polyp of colon, constipation, gerd.  The various methods of treatment have been discussed with the patient and family. After consideration of risks, benefits and other options for treatment, the patient has consented to  Procedure(s): COLONOSCOPY WITH PROPOFOL (N/A) ESOPHAGOGASTRODUODENOSCOPY (EGD) WITH PROPOFOL (N/A) as a surgical intervention.  The patient's history has been reviewed, patient examined, no change in status, stable for surgery.  I have reviewed the patient's chart and labs.  Questions were answered to the patient's satisfaction.     Nelida Meuse III

## 2020-05-25 NOTE — Op Note (Addendum)
[facility] Patient Name: Joanna Brown Procedure Date: 05/25/2020 MRN: [id number] Attending MD: Estill Cotta. Loletha Carrow , MD Date of Birth: 1940/06/26 CSN: [id number] Age: 80 Admit Type: Outpatient Procedure:                Upper GI endoscopy Indications:              Esophageal reflux symptoms that persist despite                            appropriate therapy Providers:                Mallie Mussel L. Loletha Carrow, MD, Erenest Rasher, RN, Lesia Sago, Technician, Margurite Auerbach Referring MD:             Norvel Richards, MD ([facility]) Medicines:                Monitored Anesthesia Care Complications:            No immediate complications. Estimated Blood Loss:     Estimated blood loss was minimal. Procedure:                Pre-Anesthesia Assessment:                           - Prior to the procedure, a History and Physical                            was performed, and patient medications and                            allergies were reviewed. The patient's tolerance of                            previous anesthesia was also reviewed. The risks                            and benefits of the procedure and the sedation                            options and risks were discussed with the patient.                            All questions were answered, and informed consent                            was obtained. Prior Anticoagulants: The patient has                            taken no previous anticoagulant or antiplatelet                            agents. ASA Grade Assessment: IV - A patient with  severe systemic disease that is a constant threat                            to life (aortic stenosis with valve diameter < 1.0                            cm). After reviewing the risks and benefits, the                            patient was deemed in satisfactory condition to                            undergo the procedure.                            After obtaining informed consent, the endoscope was                            passed under direct vision. Throughout the                            procedure, the patient's blood pressure, pulse, and                            oxygen saturations were monitored continuously. The                            [id number]-H190 ([id number]) Olympus gastroscope was                            introduced through the mouth, and advanced to the                            second part of duodenum. The patient tolerated the                            procedure well. The upper GI endoscopy was                            accomplished without difficulty. Scope In: Scope Out: Findings:      The esophagus was normal.      Grade I varices were found in the lower third of the esophagus.      A single 6 mm benign-appearing mucosal papule (nodule) was found in the       cardia, just below the EGJ. Biopsies were taken with a cold forceps for       histology.      The exam of the stomach was otherwise normal.      The examined duodenum was normal. Impression:               - Normal esophagus.                           - Grade I esophageal varices.                           -  A single mucosal papule (nodule) found in the                            stomach. Biopsied.                           - Normal examined duodenum. Moderate Sedation:      MAC sedation used Recommendation:           - Patient has a contact number available for                            emergencies. The signs and symptoms of potential                            delayed complications were discussed with the                            patient. Return to normal activities tomorrow.                            Written discharge instructions were provided to the                            patient.                           - Resume previous diet.                           - Continue present medications.                           -  Await pathology results.                           - See the other procedure note for documentation of                            additional recommendations.                           - Follow-up with Dr. Roseanne Kaufman office regarding your                            reflux symptoms and bowel habits. Procedure Code(s):        --- Professional ---                           [phone], Esophagogastroduodenoscopy, flexible,                            transoral; with biopsy, single or multiple Diagnosis Code(s):        --- Professional ---                           I85.00, Esophageal varices without bleeding  K31.89, Other diseases of stomach and duodenum                           K21.9, Gastro-esophageal reflux disease without                            esophagitis CPT copyright 2019 American [employer] Association. All rights reserved. The codes documented in this report are preliminary and upon coder review may  be revised to meet current compliance requirements. Leeanne Butters L. Loletha Carrow, MD 05/25/2020 11:39:24 AM This report has been signed electronically. Number of Addenda: 0

## 2020-05-25 NOTE — Transfer of Care (Signed)
Immediate Anesthesia Transfer of Care Note  Patient: Joanna Brown  Procedure(s) Performed: COLONOSCOPY WITH PROPOFOL (N/A ) ESOPHAGOGASTRODUODENOSCOPY (EGD) WITH PROPOFOL (N/A ) BIOPSY  Patient Location: PACU and Endoscopy Unit  Anesthesia Type:MAC  Level of Consciousness: awake, alert  and oriented  Airway & Oxygen Therapy: Patient Spontanous Breathing and Patient connected to face mask  Post-op Assessment: Report given to RN and Post -op Vital signs reviewed and stable  Post vital signs: Reviewed and stable  Last Vitals:  Vitals Value Taken Time  BP 103/51 05/25/20 1135  Temp    Pulse 70 05/25/20 1138  Resp 13 05/25/20 1138  SpO2 99 % 05/25/20 1138  Vitals shown include unvalidated device data.  Last Pain:  Vitals:   05/25/20 0931  TempSrc: Oral  PainSc: 0-No pain         Complications: No complications documented.

## 2020-05-25 NOTE — Discharge Instructions (Signed)
YOU HAD AN ENDOSCOPIC PROCEDURE TODAY: Refer to the procedure report and other information in the discharge instructions given to you for any specific questions about what was found during the examination. If this information does not answer your questions, please call [place] office at [phone] to clarify.  ° °YOU SHOULD EXPECT: Some feelings of bloating in the abdomen. Passage of more gas than usual. Walking can help get rid of the air that was put into your GI tract during the procedure and reduce the bloating. If you had a lower endoscopy (such as a colonoscopy or flexible sigmoidoscopy) you may notice spotting of blood in your stool or on the toilet paper. Some abdominal soreness may be present for a day or two, also. ° °DIET: Your first meal following the procedure should be a light meal and then it is ok to progress to your normal diet. A half-sandwich or bowl of soup is an example of a good first meal. Heavy or fried foods are harder to digest and may make you feel nauseous or bloated. Drink plenty of fluids but you should avoid alcoholic beverages for 24 hours. If you had a esophageal dilation, please see attached instructions for diet.   ° °ACTIVITY: Your care partner should take you home directly after the procedure. You should plan to take it easy, moving slowly for the rest of the day. You can resume normal activity the day after the procedure however YOU SHOULD NOT DRIVE, use power tools, machinery or perform tasks that involve climbing or major physical exertion for 24 hours (because of the sedation medicines used during the test).  ° °SYMPTOMS TO REPORT IMMEDIATELY: °A gastroenterologist can be reached at any hour. Please call [phone]  for any of the following symptoms:  °Following lower endoscopy (colonoscopy, flexible sigmoidoscopy) °Excessive amounts of blood in the stool  °Significant tenderness, worsening of abdominal pains  °Swelling of the abdomen that is new, acute  °Fever of 100° or  higher  °Following upper endoscopy (EGD, EUS, ERCP, esophageal dilation) °Vomiting of blood or coffee ground material  °New, significant abdominal pain  °New, significant chest pain or pain under the shoulder blades  °Painful or persistently difficult swallowing  °New shortness of breath  °Black, tarry-looking or red, bloody stools ° °FOLLOW UP:  °If any biopsies were taken you will be contacted by phone or by letter within the next 1-3 weeks. Call [phone]  if you have not heard about the biopsies in 3 weeks.  °Please also call with any specific questions about appointments or follow up tests. ° °

## 2020-05-25 NOTE — Op Note (Signed)
[facility] Patient Name: Joanna Brown Procedure Date: 05/25/2020 MRN: [id number] Attending MD: Estill Cotta. Loletha Carrow , MD Date of Birth: 30-Aug-1940 CSN: [id number] Age: 80 Admit Type: Outpatient Procedure:                Colonoscopy Indications:              High risk colon cancer surveillance: Personal                            history of colon cancer (adenocarcinoma in a right                            colon polyp removed July 2021) Providers:                Estill Cotta. Loletha Carrow, MD, Erenest Rasher, RN, Lesia Sago, Technician, Margurite Auerbach Referring MD:             Norvel Richards, MD Medicines:                Monitored Anesthesia Care Complications:            No immediate complications. Estimated Blood Loss:     Estimated blood loss: none. Procedure:                Pre-Anesthesia Assessment:                           - Prior to the procedure, a History and Physical                            was performed, and patient medications and                            allergies were reviewed. The patient's tolerance of                            previous anesthesia was also reviewed. The risks                            and benefits of the procedure and the sedation                            options and risks were discussed with the patient.                            All questions were answered, and informed consent                            was obtained. Prior Anticoagulants: The patient has                            taken no previous anticoagulant or antiplatelet  agents. ASA Grade Assessment: IV - A patient with                            severe systemic disease that is a constant threat                            to life (severe aortic stenosis). After reviewing                            the risks and benefits, the patient was deemed in                            satisfactory condition to undergo the  procedure.                           After obtaining informed consent, the colonoscope                            was passed under direct vision. Throughout the                            procedure, the patient's blood pressure, pulse, and                            oxygen saturations were monitored continuously. The                            [id number]-HQ190L ([id number]) Olympus colonoscope was                            introduced through the anus and advanced to the the                            cecum, identified by appendiceal orifice and                            ileocecal valve. The colonoscopy was extremely                            difficult due to a redundant colon and significant                            looping. Successful completion of the procedure was                            aided by changing the patient to a supine position,                            changing the patient to a prone position and using                            manual pressure. The patient tolerated the  procedure well. The quality of the bowel                            preparation was good. The ileocecal valve,                            appendiceal orifice, and rectum were photographed.                            The bowel preparation used was 2 day Suprep/Miralax. Scope In: 10:42:18 AM Scope Out: 11:28:16 AM Scope Withdrawal Time: 0 hours 13 minutes 20 seconds  Total Procedure Duration: 0 hours 45 minutes 58 seconds  Findings:      The perianal and digital rectal examinations were normal.      A few diverticula were found in the right colon.      The colon (entire examined portion) was significantly redundant,       especially the sigmoid. Scope passage was extremely challenging.      There is no endoscopic evidence of polyps in the entire colon.      The exam was otherwise without abnormality on direct and retroflexion       views. Impression:               - Diverticulosis  in the right colon.                           - Redundant colon.                           - The examination was otherwise normal on direct                            and retroflexion views.                           - No specimens collected. Moderate Sedation:      MAC sedation used Recommendation:           - Patient has a contact number available for                            emergencies. The signs and symptoms of potential                            delayed complications were discussed with the                            patient. Return to normal activities tomorrow.                            Written discharge instructions were provided to the                            patient.                           - Resume previous diet.                           -  Continue present medications.                           - No recommendation at this time regarding repeat                            colonoscopy. Dr. Roseanne Kaufman office will follow patient                            and consider the need for surveillance colonsocopy.                            Given patient's age, cardiac condition and very                            challenging colon anatomy, caution is warranted                            when considering future colonoscopy for this                            patient.                           - Follow up with Dr. Roseanne Kaufman office regarding                            constipation. Procedure Code(s):        --- Professional ---                           U8828, Colorectal cancer screening; colonoscopy on                            individual at high risk Diagnosis Code(s):        --- Professional ---                           M03.491, Personal history of other malignant                            neoplasm of large intestine CPT copyright 2019 American Medical Association. All rights reserved. The codes documented in this report are preliminary and upon coder review may  be revised to  meet current compliance requirements. [name] L. Loletha Carrow, MD 05/25/2020 11:48:22 AM This report has been signed electronically. Number of Addenda: 0

## 2020-05-25 NOTE — H&P (Signed)
History:  This patient presents for endoscopic testing for Hx malignant colon polyp.  (see 05/05/20 [facility] office note for details)  Shayne Alken Referring physician: Fayrene Helper, MD  Past Medical History: Past Medical History:  Diagnosis Date  . Arthritis    OA  . Blood transfusion    1980  . Diabetes mellitus   . Dysrhythmia   . Female bladder prolapse   . GERD 11/18/2008   Qualifier: Diagnosis of  By: Craige Cotta    . GERD (gastroesophageal reflux disease)   . Glaucoma   . Heart disease   . Hyperlipidemia   . Hypertension    echo and stress 4/10 reports on chart, EKG ` LOV 9/12 on chart  . Mild aortic stenosis   . Thyroid disease      Past Surgical History: Past Surgical History:  Procedure Laterality Date  . ABDOMINAL HYSTERECTOMY    . ANTERIOR AND POSTERIOR REPAIR  04/26/2011   Procedure: ANTERIOR (CYSTOCELE) AND POSTERIOR REPAIR (RECTOCELE);  Surgeon: Reece Packer, MD;  Location: [facility];  Service: Urology;  Laterality: N/A;  . CATARACT EXTRACTION Bilateral    with IOL  . CHOLECYSTECTOMY  2009  . COLONOSCOPY N/A 12/02/2013   three colon polyps removed, small internal hemorrhoids. Hyperplastic polyps  . COLONOSCOPY N/A 11/20/2019   pancolonic diverticulosis, two 10-11 mm polyps in ascending colon, one 5 mm polyp in cecum, ascending colon with superficially invasive adenocarcinoma arising in background of sessile serrated polyps with low and high grade cytologic dysplasia.  . COLONOSCOPY    . COLONOSCOPY W/ POLYPECTOMY    . LEFT HEART CATH  09/10/2008   normal coronary arteries, normal LV systolic function, EF 29% (Dr. [name])  . LYMPH NODE DISSECTION Right 1997   under arm  . NM MYOCAR PERF WALL MOTION  2010   dipyridamole - mild-mod in intenstiy perfusion defect in mid anterior, mid anteroseptal wall, EF 70%  . OVARY SURGERY     bilateral tumors removed  . POLYPECTOMY  11/20/2019   Procedure: POLYPECTOMY;  Surgeon: Daneil Dolin, MD;   Location: [facility];  Service: Endoscopy;;  hot and cold snare cecal polyp, and asending polyps x 2  . THYROIDECTOMY    . THYROIDECTOMY  02/2008  . TRANSTHORACIC ECHOCARDIOGRAM  08/2011   EF=>55%, mild conc LVH; trace MR; mild TR; mild-mod AV calcification with mild valvular AV stenosis  . VAGINAL PROLAPSE REPAIR  04/26/2011   Procedure: VAGINAL VAULT SUSPENSION;  Surgeon: Reece Packer, MD;  Location: [facility];  Service: Urology;  Laterality: N/A;  with Graft  10x6    Allergies: Allergies  Allergen Reactions  . Senokot Wheat Bran [Wheat Bran]     ABDOMINAL CRAMPS  . Spironolactone     Stomach problems, vision changes     Outpatient Meds: Current Facility-Administered Medications  Medication Dose Route Frequency Provider Last Rate Last Admin  . lactated ringers infusion   Intravenous Continuous Danis, Estill Cotta III, MD          ___________________________________________________________________ Objective   Exam:  BP (!) 136/59   Pulse 77   Temp 98 F (36.7 C) (Oral)   Resp 19   Wt 81.6 kg   SpO2 100%   BMI 29.95 kg/m    CV: RRR with 5/6 holosystolic murmur, F6/O1, no JVD, no peripheral edema - normal carotid upstroke  Resp: clear to auscultation bilaterally, normal RR and effort noted  GI: soft, no tenderness, with active bowel sounds.  No guarding or palpable organomegaly noted.  Neuro: awake, alert and oriented x 3. Normal gross motor function and fluent speech   Assessment:  Hx malignant colon polyp GERD  Plan:  EGD and colonoscopy   Joanna Brown

## 2020-05-26 ENCOUNTER — Encounter: Payer: Self-pay | Admitting: Internal Medicine

## 2020-05-26 ENCOUNTER — Encounter (HOSPITAL_COMMUNITY): Payer: Self-pay | Admitting: Gastroenterology

## 2020-05-26 LAB — SURGICAL PATHOLOGY

## 2020-05-26 NOTE — Anesthesia Postprocedure Evaluation (Signed)
Anesthesia Post Note  Patient: Joanna Brown  Procedure(s) Performed: COLONOSCOPY WITH PROPOFOL (N/A ) ESOPHAGOGASTRODUODENOSCOPY (EGD) WITH PROPOFOL (N/A ) BIOPSY     Patient location during evaluation: PACU Anesthesia Type: MAC Level of consciousness: awake and alert Pain management: pain level controlled Vital Signs Assessment: post-procedure vital signs reviewed and stable Respiratory status: spontaneous breathing, nonlabored ventilation, respiratory function stable and patient connected to nasal cannula oxygen Cardiovascular status: stable and blood pressure returned to baseline Postop Assessment: no apparent nausea or vomiting Anesthetic complications: no   No complications documented.  Last Vitals:  Vitals:   05/25/20 1200 05/25/20 1210  BP: 133/73 130/67  Pulse: 77 71  Resp: (!) 25 19  Temp:    SpO2: 100% 100%    Last Pain:  Vitals:   05/25/20 1210  TempSrc:   PainSc: 0-No pain                 Belenda Cruise P Izan Miron

## 2020-05-27 ENCOUNTER — Encounter: Payer: Self-pay | Admitting: Gastroenterology

## 2020-05-27 ENCOUNTER — Other Ambulatory Visit: Payer: Self-pay | Admitting: Family Medicine

## 2020-06-01 ENCOUNTER — Other Ambulatory Visit (HOSPITAL_COMMUNITY): Payer: Medicare Other

## 2020-06-04 ENCOUNTER — Ambulatory Visit (HOSPITAL_COMMUNITY)
Admission: RE | Admit: 2020-06-04 | Discharge: 2020-06-04 | Disposition: A | Discharge status: Home / Self Care | Payer: Medicare Other | Source: Ambulatory Visit | Attending: Internal Medicine | Admitting: Internal Medicine

## 2020-06-04 ENCOUNTER — Other Ambulatory Visit: Payer: Self-pay

## 2020-06-04 DIAGNOSIS — I350 Nonrheumatic aortic (valve) stenosis: Secondary | ICD-10-CM

## 2020-06-04 LAB — ECHOCARDIOGRAM COMPLETE
AR max vel: 0.71 cm2
AV Area VTI: 0.77 cm2
AV Area mean vel: 0.68 cm2
AV Mean grad: 32.5 mmHg
AV Peak grad: 51.7 mmHg
Ao pk vel: 3.6 m/s
Area-P 1/2: 2.08 cm2
S' Lateral: 2.7 cm

## 2020-06-04 NOTE — Progress Notes (Signed)
*  PRELIMINARY RESULTS* Echocardiogram 2D Echocardiogram has been performed.  Samuel Germany 06/04/2020, 10:23 AM

## 2020-06-08 ENCOUNTER — Other Ambulatory Visit: Payer: Self-pay | Admitting: *Deleted

## 2020-06-08 DIAGNOSIS — I350 Nonrheumatic aortic (valve) stenosis: Secondary | ICD-10-CM

## 2020-06-10 ENCOUNTER — Encounter: Payer: Self-pay | Admitting: "Endocrinology

## 2020-06-10 ENCOUNTER — Ambulatory Visit (INDEPENDENT_AMBULATORY_CARE_PROVIDER_SITE_OTHER): Payer: Medicare Other | Admitting: "Endocrinology

## 2020-06-10 ENCOUNTER — Other Ambulatory Visit: Payer: Self-pay

## 2020-06-10 VITALS — BP 120/70 | HR 70 | Ht 65.0 in | Wt 182.8 lb

## 2020-06-10 DIAGNOSIS — Z794 Long term (current) use of insulin: Secondary | ICD-10-CM

## 2020-06-10 DIAGNOSIS — I10 Essential (primary) hypertension: Secondary | ICD-10-CM

## 2020-06-10 DIAGNOSIS — E049 Nontoxic goiter, unspecified: Secondary | ICD-10-CM | POA: Diagnosis not present

## 2020-06-10 DIAGNOSIS — E782 Mixed hyperlipidemia: Secondary | ICD-10-CM

## 2020-06-10 DIAGNOSIS — N181 Chronic kidney disease, stage 1: Secondary | ICD-10-CM | POA: Diagnosis not present

## 2020-06-10 DIAGNOSIS — E1122 Type 2 diabetes mellitus with diabetic chronic kidney disease: Secondary | ICD-10-CM

## 2020-06-10 LAB — POCT GLYCOSYLATED HEMOGLOBIN (HGB A1C): HbA1c, POC (controlled diabetic range): 8.6 % — AB (ref 0.0–7.0)

## 2020-06-10 MED ORDER — LEVEMIR FLEXTOUCH 100 UNIT/ML ~~LOC~~ SOPN: 60.0000 [IU] | PEN_INJECTOR | Freq: Two times a day (BID) | SUBCUTANEOUS | 5 refills | Status: DC

## 2020-06-10 MED ORDER — GLIPIZIDE ER 5 MG PO TB24: 5.0000 mg | ORAL_TABLET | Freq: Every day | ORAL | 1 refills | Status: DC

## 2020-06-10 NOTE — Patient Instructions (Signed)

## 2020-06-10 NOTE — Progress Notes (Signed)
06/10/2020                          Endocrinology follow-up note   Subjective:    Patient ID: Joanna Brown, female    DOB: [birth year]. She Patient is being seen today for follow-up of uncontrolled type 2 diabetes, hyperlipidemia, hypertension.    PMD:   Fayrene Helper, MD  Past Medical History:  Diagnosis Date  . Arthritis    OA  . Blood transfusion    1980  . Diabetes mellitus   . Dysrhythmia   . Female bladder prolapse   . GERD 11/18/2008   Qualifier: Diagnosis of  By: Craige Cotta    . GERD (gastroesophageal reflux disease)   . Glaucoma   . Heart disease   . Hyperlipidemia   . Hypertension    echo and stress 4/10 reports on chart, EKG ` LOV 9/12 on chart  . Mild aortic stenosis   . Thyroid disease    Past Surgical History:  Procedure Laterality Date  . ABDOMINAL HYSTERECTOMY    . ANTERIOR AND POSTERIOR REPAIR  04/26/2011   Procedure: ANTERIOR (CYSTOCELE) AND POSTERIOR REPAIR (RECTOCELE);  Surgeon: Reece Packer, MD;  Location: [facility];  Service: Urology;  Laterality: N/A;  . BIOPSY  05/25/2020   Procedure: BIOPSY;  Surgeon: Doran Stabler, MD;  Location: [facility];  Service: Gastroenterology;;  . CATARACT EXTRACTION Bilateral    with IOL  . CHOLECYSTECTOMY  2009  . COLONOSCOPY N/A 12/02/2013   three colon polyps removed, small internal hemorrhoids. Hyperplastic polyps  . COLONOSCOPY N/A 11/20/2019   pancolonic diverticulosis, two 10-11 mm polyps in ascending colon, one 5 mm polyp in cecum, ascending colon with superficially invasive adenocarcinoma arising in background of sessile serrated polyps with low and high grade cytologic dysplasia.  . COLONOSCOPY    . COLONOSCOPY W/ POLYPECTOMY    . COLONOSCOPY WITH PROPOFOL N/A 05/25/2020   Procedure: COLONOSCOPY WITH PROPOFOL;  Surgeon: Doran Stabler, MD;  Location: [facility];  Service: Gastroenterology;  Laterality: N/A;  . ESOPHAGOGASTRODUODENOSCOPY (EGD) WITH PROPOFOL N/A 05/25/2020   Procedure:  ESOPHAGOGASTRODUODENOSCOPY (EGD) WITH PROPOFOL;  Surgeon: Doran Stabler, MD;  Location: [facility];  Service: Gastroenterology;  Laterality: N/A;  . LEFT HEART CATH  09/10/2008   normal coronary arteries, normal LV systolic function, EF 62% (Dr. [name])  . LYMPH NODE DISSECTION Right 1997   under arm  . NM MYOCAR PERF WALL MOTION  2010   dipyridamole - mild-mod in intenstiy perfusion defect in mid anterior, mid anteroseptal wall, EF 70%  . OVARY SURGERY     bilateral tumors removed  . POLYPECTOMY  11/20/2019   Procedure: POLYPECTOMY;  Surgeon: Daneil Dolin, MD;  Location: [facility];  Service: Endoscopy;;  hot and cold snare cecal polyp, and asending polyps x 2  . THYROIDECTOMY    . THYROIDECTOMY  02/2008  . TRANSTHORACIC ECHOCARDIOGRAM  08/2011   EF=>55%, mild conc LVH; trace MR; mild TR; mild-mod AV calcification with mild valvular AV stenosis  . VAGINAL PROLAPSE REPAIR  04/26/2011   Procedure: VAGINAL VAULT SUSPENSION;  Surgeon: Reece Packer, MD;  Location: [facility];  Service: Urology;  Laterality: N/A;  with Graft  10x6   Social History   Socioeconomic History  . Marital status: Married    Spouse name: [name]  . Number of children: 1  . Years of education: Trade  . Highest education level:  12th grade  Occupational History  . Occupation: Retired  Tobacco Use  . Smoking status: Never Smoker  . Smokeless tobacco: Never Used  Vaping Use  . Vaping Use: Never used  Substance and Sexual Activity  . Alcohol use: No    Comment: socially- none x 30 years  . Drug use: No  . Sexual activity: Yes  Other Topics Concern  . Not on file  Social History Narrative   Patient lives at home with spouse. 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 LIVED IN [place] FOR 50 YRS AND CAME BACK TO [place] ~2009. Caffeine Use: 1 cup of coffee daily. HAD ONE CHILD: PASSED 5 YRS. HAVE THREE GRAND-KIDS AND TWO GREAT GRANDS.    Social Determinants of Health   Financial  Resource Strain: Low Risk   . Difficulty of Paying Living Expenses: Not hard at all  Food Insecurity: Not on file  Transportation Needs: No Transportation Needs  . Lack of Transportation (Medical): No  . Lack of Transportation (Non-Medical): No  Physical Activity: Insufficiently Active  . Days of Exercise per Week: 3 days  . Minutes of Exercise per Session: 20 min  Stress: No Stress Concern Present  . Feeling of Stress : Only a little  Social Connections: Socially Integrated  . Frequency of Communication with Friends and Family: Twice a week  . Frequency of Social Gatherings with Friends and Family: Twice a week  . Attends Religious Services: More than 4 times per year  . Active Member of Clubs or Organizations: No  . Attends Archivist Meetings: 1 to 4 times per year  . Marital Status: Married   Outpatient Encounter Medications as of 06/10/2020  Medication Sig  . ALPHAGAN P 0.1 % SOLN Place 1 drop into both eyes 2 (two) times daily.  Marland Kitchen amLODipine (NORVASC) 10 MG tablet TAKE 1 TABLET(10 MG) BY MOUTH EVERY MORNING  . Ascorbic Acid (VITAMIN C) 1000 MG tablet Take 1,000 mg by mouth 4 (four) times a week.  Marland Kitchen aspirin 81 MG tablet Take 81 mg by mouth every other day.   . benazepril (LOTENSIN) 40 MG tablet TAKE 1 TABLET(40 MG) BY MOUTH DAILY  . bisacodyl (BISACODYL) 5 MG EC tablet 2 tablets by mouth every 3 nights (Patient taking differently: Take 10 mg by mouth every 3 (three) days.)  . Blood Glucose Monitoring Suppl (ACCU-CHEK GUIDE) w/Device KIT 1 each by Does not apply route 4 (four) times daily.  . Calcium Carbonate-Vit D-Min (CALCIUM 1200) 1200-1000 MG-UNIT CHEW Chew 1 each by mouth daily.  . cholecalciferol (VITAMIN D3) 25 MCG (1000 UT) tablet Take 1,000 Units by mouth daily.  . clotrimazole-betamethasone (LOTRISONE) cream Apply twice daily for 1 week, to affected area , then as needed  . ezetimibe (ZETIA) 10 MG tablet TAKE 1 TABLET(10 MG) BY MOUTH DAILY (Patient taking  differently: Take 10 mg by mouth daily.)  . glipiZIDE (GLUCOTROL XL) 5 MG 24 hr tablet Take 1 tablet (5 mg total) by mouth daily with breakfast.  . glucose blood (ACCU-CHEK GUIDE) test strip USE TO CHECK BLOOD SUGAR TWICE DAILY  . insulin detemir (LEVEMIR FLEXTOUCH) 100 UNIT/ML FlexPen Inject 60 Units into the skin 2 (two) times daily.  . Insulin Pen Needle (B-D ULTRAFINE III SHORT PEN) 31G X 8 MM MISC USE AS DIRECTED TWO TIMES DAILY WITH INSULIN PENS  . Lancets Ultra Fine MISC 1 each by Does not apply route 4 (four) times daily.  Marland Kitchen latanoprost (XALATAN) 0.005 % ophthalmic  solution Place 1 drop into both eyes at bedtime.   . metFORMIN (GLUCOPHAGE) 500 MG tablet TAKE 1 TABLET BY MOUTH EVERY DAY WITH BREAKFAST (Patient taking differently: Take 500 mg by mouth daily with breakfast.)  . Multiple Vitamins-Minerals (CENTRUM) tablet Take 1 tablet by mouth every morning.   . Omega-3 Fatty Acids (FISH OIL) 1200 MG CAPS Take 1 capsule by mouth every morning.   . pantoprazole (PROTONIX) 40 MG tablet Take 1 tablet (40 mg total) by mouth daily. 30 minutes before meal  . polyethylene glycol (MIRALAX) 17 g packet Take 17 g by mouth at bedtime. (Patient not taking: Reported on 06/10/2020)  . pravastatin (PRAVACHOL) 80 MG tablet TAKE 1 TABLET(80 MG) BY MOUTH DAILY (Patient taking differently: Take 80 mg by mouth daily.)  . triamterene-hydrochlorothiazide (MAXZIDE) 75-50 MG tablet TAKE 1 TABLET BY MOUTH DAILY  . [DISCONTINUED] glipiZIDE (GLUCOTROL XL) 5 MG 24 hr tablet TAKE 2 TABLETS BY MOUTH DAILY WITH BREAKFAST (Patient taking differently: Take 10 mg by mouth daily with breakfast.)  . [DISCONTINUED] insulin detemir (LEVEMIR FLEXTOUCH) 100 UNIT/ML FlexPen Inject 50 Units into the skin 2 (two) times daily.   No facility-administered encounter medications on file as of 06/10/2020.   ALLERGIES: Allergies  Allergen Reactions  . Senokot Wheat Bran [Wheat Bran]     ABDOMINAL CRAMPS  . Spironolactone     Stomach  problems, vision changes    VACCINATION STATUS: Immunization History  Administered Date(s) Administered  . Moderna Sars-Covid-2 Vaccination 09/13/2019, 10/12/2019  . Pneumococcal Conjugate-13 12/11/2013  . Pneumococcal Polysaccharide-23 01/13/2010  . Tdap 10/05/2010    Diabetes She presents for her follow-up diabetic visit. She has type 2 diabetes mellitus. Onset time: She was diagnosed at approximate age of 39 years. Her disease course has been worsening. There are no hypoglycemic associated symptoms. Pertinent negatives for hypoglycemia include no confusion, headaches, pallor or seizures. Pertinent negatives for diabetes include no blurred vision, no chest pain, no fatigue, no polydipsia, no polyphagia and no polyuria. There are no hypoglycemic complications. Symptoms are improving. Diabetic complications include nephropathy and retinopathy. Risk factors for coronary artery disease include dyslipidemia, diabetes mellitus, obesity and sedentary lifestyle. Her weight is fluctuating minimally. She is following a generally unhealthy diet. When asked about meal planning, she reported none. She has had a previous visit with a dietitian. She rarely participates in exercise. Her home blood glucose trend is increasing steadily. Her breakfast blood glucose range is generally 140-180 mg/dl. Her bedtime blood glucose range is generally 180-200 mg/dl. Her overall blood glucose range is 180-200 mg/dl. (She presents with slight improvement, still significantly above target glycemic profile. Her point-of-care A1c is 8.6%, overall improving from 9.4%. She did not document or report hypoglycemia.   ) Eye exam is current.  Hyperlipidemia This is a chronic problem. The current episode started more than 1 year ago. The problem is uncontrolled. Exacerbating diseases include diabetes and obesity. Pertinent negatives include no chest pain, myalgias or shortness of breath. Current antihyperlipidemic treatment includes  statins. Risk factors for coronary artery disease include dyslipidemia, diabetes mellitus, hypertension, obesity, a sedentary lifestyle and post-menopausal.  Hypertension This is a chronic problem. The current episode started more than 1 year ago. Pertinent negatives include no blurred vision, chest pain, headaches, palpitations or shortness of breath. Risk factors for coronary artery disease include dyslipidemia, diabetes mellitus, obesity and sedentary lifestyle. Hypertensive end-organ damage includes retinopathy.   Nodular goiter She Patient is being seen today for a new issue with her thyroid.  She  was found to have 2.6 cm nodule on right lobe of her thyroid while undergoing CT scan of the chest during cancer surveillance.  She has previous history of left hemithyroidectomy approximately 10 years ago for multinodular goiter.  She is not on thyroid hormone supplement or replacement.  She denies dysphagia, shortness of breath, nor voice change. -Her subsequent thyroid ultrasound confirms multinodular right lobe of the thyroid with surgically absent left lobe.  Her right thyroid lobe nodule did not meet criteria for biopsy.  Review of systems  Constitutional: + Minimally fluctuating body weight, current  Body mass index is 30.42 kg/m. , no fatigue, no subjective hyperthermia, no subjective hypothermia Eyes: no blurry vision, no xerophthalmia ENT: no sore throat, no nodules palpated in throat, no dysphagia/odynophagia, no hoarseness Cardiovascular: no Chest Pain, no Shortness of Breath, no palpitations, no leg swelling Respiratory: no cough, no shortness of breath Gastrointestinal: no Nausea/Vomiting/Diarhhea Musculoskeletal: no muscle/joint aches Skin: no rashes, no hyperemia Neurological: no tremors, no numbness, no tingling, no dizziness Psychiatric: no depression, no anxiety   Objective:    BP 120/70   Pulse 70   Ht '5\' 5"'  (1.651 m)   Wt 182 lb 12.8 oz (82.9 kg)   BMI 30.42 kg/m    Wt Readings from Last 3 Encounters:  06/10/20 182 lb 12.8 oz (82.9 kg)  05/25/20 180 lb (81.6 kg)  05/11/20 183 lb 12.8 oz (83.4 kg)    Physical Exam- Limited  Constitutional:  Body mass index is 30.42 kg/m. , not in acute distress, normal state of mind Eyes:  EOMI, no exophthalmos Neck: Supple Thyroid: No gross goiter Respiratory: Adequate breathing efforts Musculoskeletal: no gross deformities, strength intact in all four extremities, no gross restriction of joint movements Skin:  no rashes, no hyperemia Neurological: no tremor with outstretched hands   CMP ( most recent) CMP     Component Value Date/Time   NA 134 (L) 05/05/2020 1029   NA 135 12/31/2018 0915   K 3.7 05/05/2020 1029   CL 94 (L) 05/05/2020 1029   CO2 33 (H) 05/05/2020 1029   GLUCOSE 104 (H) 05/05/2020 1029   BUN 15 05/05/2020 1029   BUN 13 12/31/2018 0915   CREATININE 1.04 05/05/2020 1029   CREATININE 1.11 (H) 03/02/2020 0848   CALCIUM 10.3 05/05/2020 1029   PROT 7.3 03/02/2020 0848   PROT 7.4 12/31/2018 0915   ALBUMIN 4.6 12/31/2018 0915   AST 27 03/02/2020 0848   ALT 23 03/02/2020 0848   ALKPHOS 57 12/31/2018 0915   BILITOT 0.5 03/02/2020 0848   BILITOT 0.2 12/31/2018 0915   GFRNONAA 57 (L) 07/30/2019 0718   GFRAA 66 07/30/2019 0718    Diabetic Labs (most recent): Lab Results  Component Value Date   HGBA1C 8.8 (A) 03/10/2020   HGBA1C 8.3 (A) 11/08/2019   HGBA1C 9.0 (A) 08/08/2019    Lipid Panel     Component Value Date/Time   CHOL 161 07/30/2019 0718   TRIG 179 (H) 07/30/2019 0718   HDL 34 (L) 07/30/2019 0718   CHOLHDL 4.7 07/30/2019 0718   VLDL 39 12/05/2017 0832   LDLCALC 99 07/30/2019 0718   Incidental finding on chest CT on December 05, 2019 Enlarged  multinodular remnant right thyroid with dominant 2.6 cm hypodense Nodule.   Thyroid ultrasound on December 19, 2019: Right lobe 4.4 cm, left lobe absent surgically. No adenopathy  IMPRESSION: Surgical changes of left  hemithyroidectomy. Multinodular thyroid. 3.9 cm and 1.6 cm cystic/almost completely cystic nodules with smooth margins,  No thyroid nodule meets criteria for biopsy or surveillance, as designated by the newly established ACR TI-RADS criteria.   Assessment & Plan:     1.  Nodular goiter: Patient with remote past history of left hemithyroidectomy for multinodular goiter.  She did not require thyroid hormone supplement.  She was incidentally found to have 2.6 cm nodule in the right lobe. -Her subsequent dedicated thyroid ultrasound confirmed 2 nodules on the right lobe of her thyroid which did not meet any criteria for biopsy. -She would not need any antithyroid intervention at this time, TFTs are consistent with euthyroid state.  She will be considered for repeat ultrasound in 1 year.  2. Type 2 diabetes mellitus with stage 1 chronic kidney disease, with long-term current use of insulin ([place]) She presents with slight improvement, still significantly above target glycemic profile. Her point-of-care A1c is 8.6%, overall improving from 9.4%. She did not document or report hypoglycemia.   Recent labs reviewed, showing improving renal function.     Her diabetes is complicated by CKD obesity/sedentary life and patient remains at a high risk for more acute and chronic complications of diabetes which include CAD, CVA, CKD, retinopathy, and neuropathy. These are all discussed in detail with the patient.  - I have counseled the patient on diet management and weight loss, by adopting a carbohydrate restricted/protein rich diet.  -She still admits to dietary indiscretions including consumption of sweets and sweetened beverages.  - she acknowledges that there is a room for improvement in her food and drink choices. - Suggestion is made for her to avoid simple carbohydrates  from her diet including Cakes, Sweet Desserts, Ice Cream, Soda (diet and regular), Sweet Tea, Candies, Chips, Cookies, Store Bought  Juices, Alcohol in Excess of  1-2 drinks a day, Artificial Sweeteners,  Coffee Creamer, and "Sugar-free" Products, Lemonade. This will help patient to have more stable blood glucose profile and potentially avoid unintended weight gain.   - I encouraged the patient to switch to  unprocessed or minimally processed complex starch and increased protein intake (animal or plant source), fruits, and vegetables.  - Patient is advised to stick to a routine mealtimes to eat 3 meals  a day and avoid unnecessary snacks ( to snack only to correct hypoglycemia).   - I have approached patient with the following individualized plan to manage diabetes and patient agrees:   -In light of her presentation with above target glycemic profile, she will need multiple daily injections of insulin.    -This patient will be overwhelmed with basal/bolus insulin regimen.   -For simplicity reasons, she is advised to increase Levemir to 60 units twice a day- at breakfast and at bedtime.     -Patient is encouraged to call clinic for blood glucose levels less than 70 or above 200 mg /dl. -She is advised to to continue glipizide 5 mg XL p.o. daily at breakfast.   -She will continue to benefit from low-dose Metformin, advised to continue Metformin 500 mg p.o. daily at breakfast.    -Patient is not a candidate for  SGLT2 inhibitors due to CKD. -If her next visit A1c is  above  9% on next visit, she will be reconsidered for  prandial insulin in addition to her basal insulin.  - Patient specific target  A1c;  LDL, HDL, Triglycerides, were discussed in detail.  3) BP/HTN:  -Her blood pressure is controlled to target.   She is advised to continue her current blood pressure medications including benazepril 40  mg p.o. daily.   4) Lipids/HPL: Her recent lipid panel showed uncontrolled LDL at 97.  She is advised to continue pravastatin 80 mg p.o. nightly.  Side effects and precautions discussed with her.           5)  Weight/Diet:  Her BMI is 30.4  -she is a candidate for moderate weight loss.  CDE Consult has been initiated , exercise, and detailed carbohydrates information provided.  6) Chronic Care/Health Maintenance:  -Patient is on ACEI/ARB and Statin medications and encouraged to continue to follow up with Ophthalmology, Podiatrist at least yearly or according to recommendations, and advised to  stay away from smoking. I have recommended yearly flu vaccine and pneumonia vaccination at least every 5 years; moderate intensity exercise for up to 150 minutes weekly; and  sleep for at least 7 hours a day.  POCT ABI Results 06/10/20  Her ABIs normal today Right ABI: 1.33      left ABI: 1.30  Right leg systolic / diastolic: 960/45 mmHg Left leg systolic / diastolic: 409/81 mmHg  Arm systolic / diastolic: 191/47 mmHG This study will be repeated in 5 years, or sooner if needed. - I advised patient to maintain close follow up with Fayrene Helper, MD for primary care needs.  - Time spent on this patient care encounter:  40 min, of which > 50% was spent in  counseling and the rest reviewing her blood glucose logs , discussing her hypoglycemia and hyperglycemia episodes, reviewing her current and  previous labs / studies  ( including abstraction from other facilities) and medications  doses and developing a  long term treatment plan and documenting her care.   Please refer to Patient Instructions for Blood Glucose Monitoring and Insulin/Medications Dosing Guide"  in media tab for additional information. Please  also refer to " Patient Self Inventory" in the Media  tab for reviewed elements of pertinent patient history.  Derinda Late Digilio participated in the discussions, expressed understanding, and voiced agreement with the above plans.  All questions were answered to her satisfaction. she is encouraged to contact clinic should she have any questions or concerns prior to her return visit.     Follow up plan: - Return in  about 3 months (around 09/08/2020) for Bring Meter and Logs- A1c in Office.  Glade Lloyd, MD Phone: [phone]  Fax: [phone]   This note was partially dictated with voice recognition software. Similar sounding words can be transcribed inadequately or may not  be corrected upon review.  06/10/2020, 9:25 AM

## 2020-06-24 ENCOUNTER — Other Ambulatory Visit: Payer: Self-pay

## 2020-06-24 ENCOUNTER — Encounter: Payer: Self-pay | Admitting: Podiatry

## 2020-06-24 ENCOUNTER — Ambulatory Visit (INDEPENDENT_AMBULATORY_CARE_PROVIDER_SITE_OTHER): Payer: Medicare Other | Admitting: Podiatry

## 2020-06-24 DIAGNOSIS — M2042 Other hammer toe(s) (acquired), left foot: Secondary | ICD-10-CM

## 2020-06-24 DIAGNOSIS — M2041 Other hammer toe(s) (acquired), right foot: Secondary | ICD-10-CM

## 2020-06-24 DIAGNOSIS — M79675 Pain in left toe(s): Secondary | ICD-10-CM

## 2020-06-24 DIAGNOSIS — M2011 Hallux valgus (acquired), right foot: Secondary | ICD-10-CM | POA: Diagnosis not present

## 2020-06-24 DIAGNOSIS — M79674 Pain in right toe(s): Secondary | ICD-10-CM

## 2020-06-24 DIAGNOSIS — B351 Tinea unguium: Secondary | ICD-10-CM

## 2020-06-24 DIAGNOSIS — E1142 Type 2 diabetes mellitus with diabetic polyneuropathy: Secondary | ICD-10-CM

## 2020-06-24 DIAGNOSIS — M2012 Hallux valgus (acquired), left foot: Secondary | ICD-10-CM

## 2020-06-24 NOTE — Progress Notes (Signed)
This patient returns to my office for at risk foot care.  This patient requires this care by a professional since this patient will be at risk due to having diabetes and kidney disease.    This patient is unable to cut nails herself since the patient cannot reach her nails.These nails are painful walking and wearing shoes.  This patient presents for at risk foot care today.  General Appearance  Alert, conversant and in no acute stress.  Vascular  Dorsalis pedis and posterior tibial  pulses are palpable  bilaterally.  Capillary return is within normal limits  bilaterally. Temperature is within normal limits  bilaterally.  Neurologic  Senn-Weinstein monofilament wire test within normal limits  bilaterally. Muscle power within normal limits bilaterally.  Nails Thick disfigured discolored nails with subungual debris  from hallux to fifth toes bilaterally. No evidence of bacterial infection or drainage bilaterally.  Orthopedic  No limitations of motion  feet .  No crepitus or effusions noted.  No bony pathology or digital deformities noted.  Mild  HAV  B/L.  Skin  normotropic skin with no porokeratosis noted bilaterally.  No signs of infections or ulcers noted.     Onychomycosis  Pain in right toe  Pain in left toe.  Consent was obtained for treatment procedures.  Debridement and grinding of long thick nails with clearing of subungual debris with nail nipper followed by dremel tool..  No infection or ulcer.     Return office visit   10 weeks        Told patient to return for periodic foot care and evaluation due to potential at risk complications.   Gardiner Barefoot DPM

## 2020-07-23 DIAGNOSIS — H401131 Primary open-angle glaucoma, bilateral, mild stage: Secondary | ICD-10-CM | POA: Diagnosis not present

## 2020-07-29 ENCOUNTER — Ambulatory Visit (INDEPENDENT_AMBULATORY_CARE_PROVIDER_SITE_OTHER): Payer: Medicare Other

## 2020-07-29 ENCOUNTER — Other Ambulatory Visit: Payer: Self-pay

## 2020-07-29 VITALS — BP 143/76 | Ht 65.0 in | Wt 186.0 lb

## 2020-07-29 DIAGNOSIS — Z0000 Encounter for general adult medical examination without abnormal findings: Secondary | ICD-10-CM

## 2020-07-29 NOTE — Patient Instructions (Addendum)
[name] , Thank you for taking time to come for your Medicare Wellness Visit. I appreciate your ongoing commitment to your health goals. Please review the following plan we discussed and let me know if I can assist you in the future.   Screening recommendations/referrals: Colonoscopy: 05/2025 Mammogram: 11/2020 Bone Density: Complete Recommended yearly ophthalmology/optometry visit for glaucoma screening and checkup Recommended yearly dental visit for hygiene and checkup  Vaccinations: Influenza vaccine: Declines Pneumococcal vaccine: Complete Tdap vaccine: 10/04/20 Shingles vaccine: Declines  Advanced directives: POA  Conditions/risks identified: None  Next appointment: 08/26/20 @ 8:20 am   Preventive Care 65 Years and Older, Female Preventive care refers to lifestyle choices and visits with your health care provider that can promote health and wellness. What does preventive care include?  A yearly physical exam. This is also called an annual well check.  Dental exams once or twice a year.  Routine eye exams. Ask your health care provider how often you should have your eyes checked.  Personal lifestyle choices, including:  Daily care of your teeth and gums.  Regular physical activity.  Eating a healthy diet.  Avoiding tobacco and drug use.  Limiting alcohol use.  Practicing safe sex.  Taking low-dose aspirin every day.  Taking vitamin and mineral supplements as recommended by your health care provider. What happens during an annual well check? The services and screenings done by your health care provider during your annual well check will depend on your age, overall health, lifestyle risk factors, and family history of disease. Counseling  Your health care provider may ask you questions about your:  Alcohol use.  Tobacco use.  Drug use.  Emotional well-being.  Home and relationship well-being.  Sexual activity.  Eating habits.  History of  falls.  Memory and ability to understand (cognition).  Work and work Statistician.  Reproductive health. Screening  You may have the following tests or measurements:  Height, weight, and BMI.  Blood pressure.  Lipid and cholesterol levels. These may be checked every 5 years, or more frequently if you are over 16 years old.  Skin check.  Lung cancer screening. You may have this screening every year starting at age 49 if you have a 30-pack-year history of smoking and currently smoke or have quit within the past 15 years.  Fecal occult blood test (FOBT) of the stool. You may have this test every year starting at age 79.  Flexible sigmoidoscopy or colonoscopy. You may have a sigmoidoscopy every 5 years or a colonoscopy every 10 years starting at age 76.  Hepatitis C blood test.  Hepatitis B blood test.  Sexually transmitted disease (STD) testing.  Diabetes screening. This is done by checking your blood sugar (glucose) after you have not eaten for a while (fasting). You may have this done every 1-3 years.  Bone density scan. This is done to screen for osteoporosis. You may have this done starting at age 45.  Mammogram. This may be done every 1-2 years. Talk to your health care provider about how often you should have regular mammograms. Talk with your health care provider about your test results, treatment options, and if necessary, the need for more tests. Vaccines  Your health care provider may recommend certain vaccines, such as:  Influenza vaccine. This is recommended every year.  Tetanus, diphtheria, and acellular pertussis (Tdap, Td) vaccine. You may need a Td booster every 10 years.  Zoster vaccine. You may need this after age 83.  Pneumococcal 13-valent conjugate (PCV13) vaccine.  One dose is recommended after age 24.  Pneumococcal polysaccharide (PPSV23) vaccine. One dose is recommended after age 61. Talk to your health care provider about which screenings and  vaccines you need and how often you need them. This information is not intended to replace advice given to you by your health care provider. Make sure you discuss any questions you have with your health care provider. Document Released: 05/29/2015 Document Revised: 01/20/2016 Document Reviewed: 03/03/2015 Elsevier Interactive Patient Education  2017 [place] Prevention in the Home Falls can cause injuries. They can happen to people of all ages. There are many things you can do to make your home safe and to help prevent falls. What can I do on the outside of my home?  Regularly fix the edges of walkways and driveways and fix any cracks.  Remove anything that might make you trip as you walk through a door, such as a raised step or threshold.  Trim any bushes or trees on the path to your home.  Use bright outdoor lighting.  Clear any walking paths of anything that might make someone trip, such as rocks or tools.  Regularly check to see if handrails are loose or broken. Make sure that both sides of any steps have handrails.  Any raised decks and porches should have guardrails on the edges.  Have any leaves, snow, or ice cleared regularly.  Use sand or salt on walking paths during winter.  Clean up any spills in your garage right away. This includes oil or grease spills. What can I do in the bathroom?  Use night lights.  Install grab bars by the toilet and in the tub and shower. Do not use towel bars as grab bars.  Use non-skid mats or decals in the tub or shower.  If you need to sit down in the shower, use a plastic, non-slip stool.  Keep the floor dry. Clean up any water that spills on the floor as soon as it happens.  Remove soap buildup in the tub or shower regularly.  Attach bath mats securely with double-sided non-slip rug tape.  Do not have throw rugs and other things on the floor that can make you trip. What can I do in the bedroom?  Use night  lights.  Make sure that you have a light by your bed that is easy to reach.  Do not use any sheets or blankets that are too big for your bed. They should not hang down onto the floor.  Have a firm chair that has side arms. You can use this for support while you get dressed.  Do not have throw rugs and other things on the floor that can make you trip. What can I do in the kitchen?  Clean up any spills right away.  Avoid walking on wet floors.  Keep items that you use a lot in easy-to-reach places.  If you need to reach something above you, use a strong step stool that has a grab bar.  Keep electrical cords out of the way.  Do not use floor polish or wax that makes floors slippery. If you must use wax, use non-skid floor wax.  Do not have throw rugs and other things on the floor that can make you trip. What can I do with my stairs?  Do not leave any items on the stairs.  Make sure that there are handrails on both sides of the stairs and use them. Fix handrails that are broken  or loose. Make sure that handrails are as long as the stairways.  Check any carpeting to make sure that it is firmly attached to the stairs. Fix any carpet that is loose or worn.  Avoid having throw rugs at the top or bottom of the stairs. If you do have throw rugs, attach them to the floor with carpet tape.  Make sure that you have a light switch at the top of the stairs and the bottom of the stairs. If you do not have them, ask someone to add them for you. What else can I do to help prevent falls?  Wear shoes that:  Do not have high heels.  Have rubber bottoms.  Are comfortable and fit you well.  Are closed at the toe. Do not wear sandals.  If you use a stepladder:  Make sure that it is fully opened. Do not climb a closed stepladder.  Make sure that both sides of the stepladder are locked into place.  Ask someone to hold it for you, if possible.  Clearly mark and make sure that you can  see:  Any grab bars or handrails.  First and last steps.  Where the edge of each step is.  Use tools that help you move around (mobility aids) if they are needed. These include:  Canes.  Walkers.  Scooters.  Crutches.  Turn on the lights when you go into a dark area. Replace any light bulbs as soon as they burn out.  Set up your furniture so you have a clear path. Avoid moving your furniture around.  If any of your floors are uneven, fix them.  If there are any pets around you, be aware of where they are.  Review your medicines with your doctor. Some medicines can make you feel dizzy. This can increase your chance of falling. Ask your doctor what other things that you can do to help prevent falls. This information is not intended to replace advice given to you by your health care provider. Make sure you discuss any questions you have with your health care provider. Document Released: 02/26/2009 Document Revised: 10/08/2015 Document Reviewed: 06/06/2014 Elsevier Interactive Patient Education  2017 [employer].

## 2020-07-29 NOTE — Progress Notes (Signed)
Subjective:   Joanna Brown is a 80 y.o. female who presents for Medicare Annual (Subsequent) preventive examination.  Review of Systems       Objective:    There were no vitals filed for this visit. There is no height or weight on file to calculate BMI.  Advanced Directives 05/25/2020 11/20/2019 07/23/2018 07/19/2017 07/19/2016 02/17/2016 07/22/2015  Does Patient Have a Medical Advance Directive? No Yes No No No Yes No  Type of Advance Directive - Living will - - - - -  Copy of [place] in Chart? - - - - - No - copy requested -  Would patient like information on creating a medical advance directive? No - Patient declined - No - Patient declined Yes (ED - Information included in AVS) Yes (MAU/Ambulatory/Procedural Areas - Information given) - No - patient declined information  Pre-existing out of facility DNR order (yellow form or pink MOST form) - - - - - - -    Current Medications (verified) Outpatient Encounter Medications as of 07/29/2020  Medication Sig  . ALPHAGAN P 0.1 % SOLN Place 1 drop into both eyes 2 (two) times daily.  Marland Kitchen amLODipine (NORVASC) 10 MG tablet TAKE 1 TABLET(10 MG) BY MOUTH EVERY MORNING  . Ascorbic Acid (VITAMIN C) 1000 MG tablet Take 1,000 mg by mouth 4 (four) times a week.  Marland Kitchen aspirin 81 MG tablet Take 81 mg by mouth every other day.   . benazepril (LOTENSIN) 40 MG tablet TAKE 1 TABLET(40 MG) BY MOUTH DAILY  . bisacodyl (BISACODYL) 5 MG EC tablet 2 tablets by mouth every 3 nights (Patient taking differently: Take 10 mg by mouth every 3 (three) days.)  . Blood Glucose Monitoring Suppl (ACCU-CHEK GUIDE) w/Device KIT 1 each by Does not apply route 4 (four) times daily.  . Calcium Carbonate-Vit D-Min (CALCIUM 1200) 1200-1000 MG-UNIT CHEW Chew 1 each by mouth daily.  . cholecalciferol (VITAMIN D3) 25 MCG (1000 UT) tablet Take 1,000 Units by mouth daily.  . clotrimazole-betamethasone (LOTRISONE) cream Apply twice daily for 1 week, to affected area ,  then as needed  . ezetimibe (ZETIA) 10 MG tablet TAKE 1 TABLET(10 MG) BY MOUTH DAILY (Patient taking differently: Take 10 mg by mouth daily.)  . glipiZIDE (GLUCOTROL XL) 5 MG 24 hr tablet Take 1 tablet (5 mg total) by mouth daily with breakfast.  . glucose blood (ACCU-CHEK GUIDE) test strip USE TO CHECK BLOOD SUGAR TWICE DAILY  . insulin detemir (LEVEMIR FLEXTOUCH) 100 UNIT/ML FlexPen Inject 60 Units into the skin 2 (two) times daily.  . Insulin Pen Needle (B-D ULTRAFINE III SHORT PEN) 31G X 8 MM MISC USE AS DIRECTED TWO TIMES DAILY WITH INSULIN PENS  . Lancets Ultra Fine MISC 1 each by Does not apply route 4 (four) times daily.  Marland Kitchen latanoprost (XALATAN) 0.005 % ophthalmic solution Place 1 drop into both eyes at bedtime.   . metFORMIN (GLUCOPHAGE) 500 MG tablet TAKE 1 TABLET BY MOUTH EVERY DAY WITH BREAKFAST (Patient taking differently: Take 500 mg by mouth daily with breakfast.)  . Multiple Vitamins-Minerals (CENTRUM) tablet Take 1 tablet by mouth every morning.   . Omega-3 Fatty Acids (FISH OIL) 1200 MG CAPS Take 1 capsule by mouth every morning.   . pantoprazole (PROTONIX) 40 MG tablet Take 1 tablet (40 mg total) by mouth daily. 30 minutes before meal  . polyethylene glycol (MIRALAX) 17 g packet Take 17 g by mouth at bedtime.  . pravastatin (PRAVACHOL) 80 MG  tablet TAKE 1 TABLET(80 MG) BY MOUTH DAILY (Patient taking differently: Take 80 mg by mouth daily.)  . triamterene-hydrochlorothiazide (MAXZIDE) 75-50 MG tablet TAKE 1 TABLET BY MOUTH DAILY   No facility-administered encounter medications on file as of 07/29/2020.    Allergies (verified) Senokot wheat bran [wheat bran] and Spironolactone   History: Past Medical History:  Diagnosis Date  . Arthritis    OA  . Blood transfusion    1980  . Diabetes mellitus   . Dysrhythmia   . Female bladder prolapse   . GERD 11/18/2008   Qualifier: Diagnosis of  By: Craige Cotta    . GERD (gastroesophageal reflux disease)   . Glaucoma   . Heart  disease   . Hyperlipidemia   . Hypertension    echo and stress 4/10 reports on chart, EKG ` LOV 9/12 on chart  . Mild aortic stenosis   . Thyroid disease    Past Surgical History:  Procedure Laterality Date  . ABDOMINAL HYSTERECTOMY    . ANTERIOR AND POSTERIOR REPAIR  04/26/2011   Procedure: ANTERIOR (CYSTOCELE) AND POSTERIOR REPAIR (RECTOCELE);  Surgeon: Reece Packer, MD;  Location: [facility];  Service: Urology;  Laterality: N/A;  . BIOPSY  05/25/2020   Procedure: BIOPSY;  Surgeon: Doran Stabler, MD;  Location: [facility];  Service: Gastroenterology;;  . CATARACT EXTRACTION Bilateral    with IOL  . CHOLECYSTECTOMY  2009  . COLONOSCOPY N/A 12/02/2013   three colon polyps removed, small internal hemorrhoids. Hyperplastic polyps  . COLONOSCOPY N/A 11/20/2019   pancolonic diverticulosis, two 10-11 mm polyps in ascending colon, one 5 mm polyp in cecum, ascending colon with superficially invasive adenocarcinoma arising in background of sessile serrated polyps with low and high grade cytologic dysplasia.  . COLONOSCOPY    . COLONOSCOPY W/ POLYPECTOMY    . COLONOSCOPY WITH PROPOFOL N/A 05/25/2020   Procedure: COLONOSCOPY WITH PROPOFOL;  Surgeon: Doran Stabler, MD;  Location: [facility];  Service: Gastroenterology;  Laterality: N/A;  . ESOPHAGOGASTRODUODENOSCOPY (EGD) WITH PROPOFOL N/A 05/25/2020   Procedure: ESOPHAGOGASTRODUODENOSCOPY (EGD) WITH PROPOFOL;  Surgeon: Doran Stabler, MD;  Location: [facility];  Service: Gastroenterology;  Laterality: N/A;  . LEFT HEART CATH  09/10/2008   normal coronary arteries, normal LV systolic function, EF 01% (Dr. [name])  . LYMPH NODE DISSECTION Right 1997   under arm  . NM MYOCAR PERF WALL MOTION  2010   dipyridamole - mild-mod in intenstiy perfusion defect in mid anterior, mid anteroseptal wall, EF 70%  . OVARY SURGERY     bilateral tumors removed  . POLYPECTOMY  11/20/2019   Procedure: POLYPECTOMY;  Surgeon: Daneil Dolin, MD;   [facility];  Service: Endoscopy;;  hot and cold snare cecal polyp, and asending polyps x 2  . THYROIDECTOMY    . THYROIDECTOMY  02/2008  . TRANSTHORACIC ECHOCARDIOGRAM  08/2011   EF=>55%, mild conc LVH; trace MR; mild TR; mild-mod AV calcification with mild valvular AV stenosis  . VAGINAL PROLAPSE REPAIR  04/26/2011   Procedure: VAGINAL VAULT SUSPENSION;  Surgeon: Reece Packer, MD;  Location: [facility];  Service: Urology;  Laterality: N/A;  with Graft  10x6   Family History  Problem Relation Age of Onset  . Stomach cancer Mother 47  . Heart disease Mother 76       heart disease  . Heart disease Father 65       MI  . Stroke Maternal Grandfather   .  Heart attack Paternal Grandfather   . Hypertension Brother   . Bone cancer Brother   . Hypertension Sister   . Liver cancer Sister   . Hypertension Sister   . Hypertension Child   . Colon cancer Neg Hx   . Colon polyps Neg Hx   . Pancreatic cancer Neg Hx   . Esophageal cancer Neg Hx   . Rectal cancer Neg Hx    Social History   Socioeconomic History  . Marital status: Married    Spouse name: [name]  . Number of children: 1  . Years of education: Trade  . Highest education level: 12th grade  Occupational History  . Occupation: Retired  Tobacco Use  . Smoking status: Never Smoker  . Smokeless tobacco: Never Used  Vaping Use  . Vaping Use: Never used  Substance and Sexual Activity  . Alcohol use: No    Comment: socially- none x 30 years  . Drug use: No  . Sexual activity: Yes  Other Topics Concern  . Not on file  Social History Narrative   Patient lives at home with spouse. 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 LIVED IN [place] FOR 50 YRS AND CAME BACK TO [place] ~2009. Caffeine Use: 1 cup of coffee daily. HAD ONE CHILD: PASSED 5 YRS. HAVE THREE GRAND-KIDS AND TWO GREAT GRANDS.    Social Determinants of Health   Financial Resource Strain: Low Risk   . Difficulty of Paying Living  Expenses: Not hard at all  Food Insecurity: Not on file  Transportation Needs: No Transportation Needs  . Lack of Transportation (Medical): No  . Lack of Transportation (Non-Medical): No  Physical Activity: Insufficiently Active  . Days of Exercise per Week: 3 days  . Minutes of Exercise per Session: 20 min  Stress: No Stress Concern Present  . Feeling of Stress : Only a little  Social Connections: Socially Integrated  . Frequency of Communication with Friends and Family: Twice a week  . Frequency of Social Gatherings with Friends and Family: Twice a week  . Attends Religious Services: More than 4 times per year  . Active Member of Clubs or Organizations: No  . Attends Archivist Meetings: 1 to 4 times per year  . Marital Status: Married    Tobacco Counseling Counseling given: Not Answered   Clinical Intake:                 Diabetic? yes         Activities of Daily Living No flowsheet data found.  Patient Care Team: Fayrene Helper, MD as PCP - General [place], Nadean Corwin, MD as PCP - Cardiology (Cardiology) [name] Nadean Corwin, MD as Attending Physician (Cardiology) Rutherford Guys, MD as Attending Physician (Ophthalmology) Arlana Lindau, RD as Dietitian (Nutrition) Eloise Harman, DO as Consulting Physician (Internal Medicine)  Indicate any recent Medical Services you may have received from other than [facility] providers in the past year (date may be approximate).     Assessment:   This is a routine wellness examination for Tobi.  Hearing/Vision screen No exam data present  Dietary issues and exercise activities discussed:    Goals    . DIET - INCREASE WATER INTAKE    . HEMOGLOBIN A1C < 7.0     A1C 8.7 07/05/2016   Will continue see nutritionist  Will work with pcp to manage medications       Depression Screen [facility] 2/9 Scores 04/01/2020 11/06/2019 07/05/2019 01/02/2019  08/01/2018 07/23/2018 04/02/2018  PHQ - 2 Score 0 0 0 0 0 3 1  PHQ- 9  Score - - - - '5 7 3    ' Fall Risk Fall Risk  04/01/2020 11/06/2019 07/29/2019 07/05/2019 01/02/2019  Falls in the past year? 0 0 0 0 0  Number falls in past yr: 0 - 0 0 0  Injury with Fall? 0 - 0 0 0  Risk for fall due to : - - - - -  Follow up - - - Falls evaluation completed;Education provided -    FALL RISK PREVENTION PERTAINING TO THE HOME:  Any stairs in or around the home? No  If so, are there any without handrails? n/a Home free of loose throw rugs in walkways, pet beds, electrical cords, etc? Yes  Adequate lighting in your home to reduce risk of falls? Yes   ASSISTIVE DEVICES UTILIZED TO PREVENT FALLS:  Life alert? No  Use of a cane, walker or w/c? No  Grab bars in the bathroom? Yes  Shower chair or bench in shower? No  Elevated toilet seat or a handicapped toilet? No   TIMED UP AND GO:  Was the test performed? No .  Length of time to ambulate n/a    Cognitive Function: MMSE - Mini Mental State Exam 07/23/2014  Orientation to time 5  Orientation to Place 5  Registration 3  Attention/ Calculation 4  Recall 1  Language- name 2 objects 2  Language- repeat 1  Language- follow 3 step command 3  Language- read & follow direction 1  Write a sentence 1  Copy design 1  Total score 27     6CIT Screen 07/29/2019 07/29/2019 07/23/2018 07/19/2017  What Year? 0 points 0 points 0 points 0 points  What month? 0 points 0 points 0 points -  What time? 0 points 0 points 0 points 0 points  Count back from 20 0 points 0 points 0 points -  Months in reverse 0 points - 0 points 0 points  Repeat phrase 0 points - 0 points 0 points  Total Score 0 - 0 -    Immunizations Immunization History  Administered Date(s) Administered  . Moderna Sars-Covid-2 Vaccination 09/13/2019, 10/12/2019  . Pneumococcal Conjugate-13 12/11/2013  . Pneumococcal Polysaccharide-23 01/13/2010  . Tdap 10/05/2010    TDAP status: Up to date  Flu Vaccine status: Declined, Education has been provided regarding  the importance of this vaccine but patient still declined. Advised may receive this vaccine at local pharmacy or Health Dept. Aware to provide a copy of the vaccination record if obtained from local pharmacy or Health Dept. Verbalized acceptance and understanding.  Pneumococcal vaccine status: Up to date  Covid-19 vaccine status: Completed vaccines  Qualifies for Shingles Vaccine? Yes   Zostavax completed No   Shingrix Completed?: No.    Education has been provided regarding the importance of this vaccine. Patient has been advised to call insurance company to determine out of pocket expense if they have not yet received this vaccine. Advised may also receive vaccine at local pharmacy or Health Dept. Verbalized acceptance and understanding.  Screening Tests Health Maintenance  Topic Date Due  . INFLUENZA VACCINE  08/13/2020 (Originally 12/15/2019)  . TETANUS/TDAP  10/04/2020  . OPHTHALMOLOGY EXAM  10/23/2020  . COVID-19 Vaccine (4 - Booster for Moderna series) 10/28/2020  . HEMOGLOBIN A1C  12/08/2020  . FOOT EXAM  06/24/2021  . DEXA SCAN  Completed  . Hepatitis C Screening  Completed  .  PNA vac Low Risk Adult  Completed  . HPV VACCINES  Aged Out    Health Maintenance  There are no preventive care reminders to display for this patient.  Colorectal cancer screening: Type of screening: Colonoscopy. Completed 05/25/20. Repeat every 5-high risk years  Mammogram status: Completed 12/04/19. Repeat every year  Bone Density status: Completed 01/14/14. Results reflect: Bone density results: NORMAL. Repeat every 5 years.  Lung Cancer Screening: (Low Dose CT Chest recommended if Age 88-80 years, 30 pack-year currently smoking OR have quit w/in 15years.) does not qualify.   Lung Cancer Screening Referral: n/a  Additional Screening:  Hepatitis C Screening: does not qualify; Completed  Vision Screening: Recommended annual ophthalmology exams for early detection of glaucoma and other disorders of  the eye. Is the patient up to date with their annual eye exam?  Yes  Who is the provider or what is the name of the office in which the patient attends annual eye exams? Dr. Gershon Crane If pt is not established with a provider, would they like to be referred to a provider to establish care? n/a.   Dental Screening: Recommended annual dental exams for proper oral hygiene  Community Resource Referral / Chronic Care Management: CRR required this visit?  No   CCM required this visit?  No      Plan:     I have personally reviewed and noted the following in the patient's chart:   . Medical and social history . Use of alcohol, tobacco or illicit drugs  . Current medications and supplements . Functional ability and status . Nutritional status . Physical activity . Advanced directives . List of other physicians . Hospitalizations, surgeries, and ER visits in previous 12 months . Vitals . Screenings to include cognitive, depression, and falls . Referrals and appointments  In addition, I have reviewed and discussed with patient certain preventive protocols, quality metrics, and best practice recommendations. A written personalized care plan for preventive services as well as general preventive health recommendations were provided to patient.     Laretta Bolster, [place]   [id number]/01/3817   Nurse Notes: AWV conducted by nurse in office with patient present for visit. Patient gave consent for visit today. Provider here in the office. Visit took 30 minutes to complete.

## 2020-07-30 ENCOUNTER — Ambulatory Visit: Payer: Medicare Other | Admitting: Gastroenterology

## 2020-07-30 ENCOUNTER — Encounter: Payer: Self-pay | Admitting: Internal Medicine

## 2020-07-31 ENCOUNTER — Other Ambulatory Visit: Payer: Self-pay | Admitting: "Endocrinology

## 2020-08-05 ENCOUNTER — Other Ambulatory Visit: Payer: Self-pay

## 2020-08-05 ENCOUNTER — Ambulatory Visit (INDEPENDENT_AMBULATORY_CARE_PROVIDER_SITE_OTHER): Payer: Medicare Other | Admitting: Gastroenterology

## 2020-08-05 ENCOUNTER — Encounter: Payer: Self-pay | Admitting: Gastroenterology

## 2020-08-05 ENCOUNTER — Telehealth: Payer: Self-pay | Admitting: Internal Medicine

## 2020-08-05 VITALS — BP 124/77 | HR 87 | Temp 97.3°F | Ht 65.0 in | Wt 184.6 lb

## 2020-08-05 DIAGNOSIS — K7460 Unspecified cirrhosis of liver: Secondary | ICD-10-CM

## 2020-08-05 DIAGNOSIS — K219 Gastro-esophageal reflux disease without esophagitis: Secondary | ICD-10-CM

## 2020-08-05 DIAGNOSIS — Z85038 Personal history of other malignant neoplasm of large intestine: Secondary | ICD-10-CM

## 2020-08-05 DIAGNOSIS — K5900 Constipation, unspecified: Secondary | ICD-10-CM | POA: Diagnosis not present

## 2020-08-05 MED ORDER — LINACLOTIDE 290 MCG PO CAPS: 290.0000 ug | ORAL_CAPSULE | Freq: Every day | ORAL | 3 refills | Status: DC

## 2020-08-05 NOTE — Progress Notes (Signed)
Referring Provider: Fayrene Helper, MD Primary Care Physician:  Fayrene Helper, MD Primary GI: Dr. Abbey Chatters  Chief Complaint  Patient presents with  . Constipation  . Gastroesophageal Reflux    belching    HPI:   Joanna Brown is a 80 y.o. female presenting today with a history of  superficial invasive adenocarcinoma arising in background of a sessile serrated polyp in ascending colon in July 2021. CT chest/abd/pelvis on file from July 2021 and repeat CT Chest/abdomen/pelvis was completed in Nov 2021 for surveillance of nonspecific small right peritoneal soft tissue nodule inferior to right liver lobe, and tin left lower lobe pulmonary nodule. Subtle changes suggestive of cirrhosis with normal spleen. No thrombocytopenia. Repeat CT chest/abd/pelvis with stable 4 mm nodularity along right upper quadrant omentum, no progressive nodulartiy, probably present since 2012. CEA was 2.4 in July 2021. Hep C screening 2014 negative. Hep A and B negative. Underwent early interval surveillance Jan 2022 with diverticulosis in right colon, redundant colon. Caution warranted for colonoscopy in future due to age and cardiac condition. She was not a candidate for anesthesia at [facility] and underwent colonoscopy by Dr. Loletha Carrow in [place]. EGD also performed with Grade 1 esophageal varices.   Taking miralax daily with BM every 2 nights. Some intermittent belching, reflux. Happens with vegetables. Sometimes forgets to take Protonix before eating. Eating lots of raw vegetables. Eats chopped broccoli and cabbage. Doesn't want surveillance EGD. Willing to pursue serial [place]. No abdominal pain. No N/V. No overt GI bleeding.     Past Medical History:  Diagnosis Date  . Arthritis    OA  . Blood transfusion    1980  . Diabetes mellitus   . Dysrhythmia   . Female bladder prolapse   . GERD 11/18/2008   Qualifier: Diagnosis of  By: Craige Cotta    . GERD (gastroesophageal reflux disease)   .  Glaucoma   . Heart disease   . Hyperlipidemia   . Hypertension    echo and stress 4/10 reports on chart, EKG ` LOV 9/12 on chart  . Mild aortic stenosis   . Thyroid disease     Past Surgical History:  Procedure Laterality Date  . ABDOMINAL HYSTERECTOMY    . ANTERIOR AND POSTERIOR REPAIR  04/26/2011   Procedure: ANTERIOR (CYSTOCELE) AND POSTERIOR REPAIR (RECTOCELE);  Surgeon: Reece Packer, MD;  Location: [facility];  Service: Urology;  Laterality: N/A;  . BIOPSY  05/25/2020   Procedure: BIOPSY;  Surgeon: Doran Stabler, MD;  Location: [facility];  Service: Gastroenterology;;  . CATARACT EXTRACTION Bilateral    with IOL  . CHOLECYSTECTOMY  2009  . COLONOSCOPY N/A 12/02/2013   three colon polyps removed, small internal hemorrhoids. Hyperplastic polyps  . COLONOSCOPY N/A 11/20/2019   pancolonic diverticulosis, two 10-11 mm polyps in ascending colon, one 5 mm polyp in cecum, ascending colon with superficially invasive adenocarcinoma arising in background of sessile serrated polyps with low and high grade cytologic dysplasia.  . COLONOSCOPY    . COLONOSCOPY W/ POLYPECTOMY    . COLONOSCOPY WITH PROPOFOL N/A 05/25/2020   Procedure: COLONOSCOPY WITH PROPOFOL;  Surgeon: Doran Stabler, MD;  Location: [facility];  Service: Gastroenterology;  Laterality: N/A;  . ESOPHAGOGASTRODUODENOSCOPY (EGD) WITH PROPOFOL N/A 05/25/2020   Procedure: ESOPHAGOGASTRODUODENOSCOPY (EGD) WITH PROPOFOL;  Surgeon: Doran Stabler, MD;  Location: [facility];  Service: Gastroenterology;  Laterality: N/A;  . LEFT HEART CATH  09/10/2008   normal  coronary arteries, normal LV systolic function, EF 58% (Dr. [name])  . LYMPH NODE DISSECTION Right 1997   under arm  . NM MYOCAR PERF WALL MOTION  2010   dipyridamole - mild-mod in intenstiy perfusion defect in mid anterior, mid anteroseptal wall, EF 70%  . OVARY SURGERY     bilateral tumors removed  . POLYPECTOMY  11/20/2019   Procedure: POLYPECTOMY;  Surgeon:  Daneil Dolin, MD;  [facility]: [facility];  Service: Endoscopy;;  hot and cold snare cecal polyp, and asending polyps x 2  . THYROIDECTOMY    . THYROIDECTOMY  02/2008  . TRANSTHORACIC ECHOCARDIOGRAM  08/2011   EF=>55%, mild conc LVH; trace MR; mild TR; mild-mod AV calcification with mild valvular AV stenosis  . VAGINAL PROLAPSE REPAIR  04/26/2011   Procedure: VAGINAL VAULT SUSPENSION;  Surgeon: Reece Packer, MD;  Location: [facility];  Service: Urology;  Laterality: N/A;  with Graft  10x6    Current Outpatient Medications  Medication Sig Dispense Refill  . ALPHAGAN P 0.1 % SOLN Place 1 drop into both eyes 2 (two) times daily.    Marland Kitchen amLODipine (NORVASC) 10 MG tablet TAKE 1 TABLET(10 MG) BY MOUTH EVERY MORNING 90 tablet 3  . Ascorbic Acid (VITAMIN C) 1000 MG tablet Take 1,000 mg by mouth 4 (four) times a week.    Marland Kitchen aspirin 81 MG tablet Take 81 mg by mouth every other day.     . benazepril (LOTENSIN) 40 MG tablet TAKE 1 TABLET(40 MG) BY MOUTH DAILY 90 tablet 1  . Blood Glucose Monitoring Suppl (ACCU-CHEK GUIDE) w/Device KIT 1 each by Does not apply route 4 (four) times daily. 1 kit 0  . Calcium Carbonate-Vit D-Min (CALCIUM 1200) 1200-1000 MG-UNIT CHEW Chew 1 each by mouth daily.    . cholecalciferol (VITAMIN D3) 25 MCG (1000 UT) tablet Take 1,000 Units by mouth daily.    . clotrimazole-betamethasone (LOTRISONE) cream Apply twice daily for 1 week, to affected area , then as needed 45 g 0  . ezetimibe (ZETIA) 10 MG tablet TAKE 1 TABLET(10 MG) BY MOUTH DAILY (Patient taking differently: Take 10 mg by mouth daily.) 90 tablet 3  . glipiZIDE (GLUCOTROL XL) 5 MG 24 hr tablet Take 1 tablet (5 mg total) by mouth daily with breakfast. 90 tablet 1  . glucose blood (ACCU-CHEK GUIDE) test strip USE TO CHECK BLOOD SUGAR TWICE DAILY 150 strip 1  . insulin detemir (LEVEMIR FLEXTOUCH) 100 UNIT/ML FlexPen Inject 60 Units into the skin 2 (two) times daily. 30 mL 5  . Insulin Pen Needle (B-D ULTRAFINE III SHORT  PEN) 31G X 8 MM MISC USE AS DIRECTED TWO TIMES DAILY WITH INSULIN PENS 100 each 5  . Lancets Ultra Fine MISC 1 each by Does not apply route 4 (four) times daily. 150 each 5  . latanoprost (XALATAN) 0.005 % ophthalmic solution Place 1 drop into both eyes at bedtime.     . metFORMIN (GLUCOPHAGE) 500 MG tablet TAKE 1 TABLET BY MOUTH EVERY DAY WITH BREAKFAST (Patient taking differently: Take 500 mg by mouth daily with breakfast.) 60 tablet 2  . Multiple Vitamins-Minerals (CENTRUM) tablet Take 1 tablet by mouth every morning.     . Omega-3 Fatty Acids (FISH OIL) 1200 MG CAPS Take 1 capsule by mouth every morning.     . pantoprazole (PROTONIX) 40 MG tablet Take 1 tablet (40 mg total) by mouth daily. 30 minutes before meal 90 tablet 3  . polyethylene glycol (MIRALAX) 17 g  packet Take 17 g by mouth at bedtime. 14 each 0  . pravastatin (PRAVACHOL) 80 MG tablet TAKE 1 TABLET(80 MG) BY MOUTH DAILY (Patient taking differently: Take 80 mg by mouth daily.) 90 tablet 1  . triamterene-hydrochlorothiazide (MAXZIDE) 75-50 MG tablet TAKE 1 TABLET BY MOUTH DAILY 90 tablet 1  . bisacodyl (BISACODYL) 5 MG EC tablet 2 tablets by mouth every 3 nights (Patient not taking: Reported on 08/05/2020) 30 tablet 0   No current facility-administered medications for this visit.    Allergies as of 08/05/2020 - Review Complete 08/05/2020  Allergen Reaction Noted  . Senokot wheat bran [wheat bran]  01/18/2018  . Spironolactone  03/24/2014    Family History  Problem Relation Age of Onset  . Stomach cancer Mother 14  . Heart disease Mother 48       heart disease  . Heart disease Father 48       MI  . Stroke Maternal Grandfather   . Heart attack Paternal Grandfather   . Hypertension Brother   . Bone cancer Brother   . Hypertension Sister   . Liver cancer Sister   . Hypertension Sister   . Hypertension Child   . Colon cancer Neg Hx   . Colon polyps Neg Hx   . Pancreatic cancer Neg Hx   . Esophageal cancer Neg Hx   .  Rectal cancer Neg Hx     Social History   Socioeconomic History  . Marital status: Married    Spouse name: [name]  . Number of children: 1  . Years of education: Trade  . Highest education level: 12th grade  Occupational History  . Occupation: Retired  Tobacco Use  . Smoking status: Never Smoker  . Smokeless tobacco: Never Used  Vaping Use  . Vaping Use: Never used  Substance and Sexual Activity  . Alcohol use: No    Comment: socially- none x 30 years  . Drug use: No  . Sexual activity: Yes  Other Topics Concern  . Not on file  Social History Narrative   Patient lives at home with spouse. 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 LIVED IN [place] FOR 50 YRS AND CAME BACK TO [place] ~2009. Caffeine Use: 1 cup of coffee daily. HAD ONE CHILD: PASSED 5 YRS. HAVE THREE GRAND-KIDS AND TWO GREAT GRANDS.    Social Determinants of Health   Financial Resource Strain: Low Risk   . Difficulty of Paying Living Expenses: Not hard at all  Food Insecurity: No Food Insecurity  . Worried About Charity fundraiser in the Last Year: Never true  . Ran Out of Food in the Last Year: Never true  Transportation Needs: No Transportation Needs  . Lack of Transportation (Medical): No  . Lack of Transportation (Non-Medical): No  Physical Activity: Sufficiently Active  . Days of Exercise per Week: 5 days  . Minutes of Exercise per Session: 30 min  Stress: No Stress Concern Present  . Feeling of Stress : Not at all  Social Connections: Moderately Integrated  . Frequency of Communication with Friends and Family: More than three times a week  . Frequency of Social Gatherings with Friends and Family: Twice a week  . Attends Religious Services: More than 4 times per year  . Active Member of Clubs or Organizations: No  . Attends Archivist Meetings: Never  . Marital Status: Married    Review of Systems: Gen: Denies fever, chills, anorexia. Denies fatigue, weakness, weight  loss.  CV: Denies chest pain, palpitations, syncope, peripheral edema, and claudication. Resp: Denies dyspnea at rest, cough, wheezing, coughing up blood, and pleurisy. GI: see HPI Derm: Denies rash, itching, dry skin Psych: Denies depression, anxiety, memory loss, confusion. No homicidal or suicidal ideation.  Heme: Denies bruising, bleeding, and enlarged lymph nodes.  Physical Exam: BP 124/77   Pulse 87   Temp (!) 97.3 F (36.3 C) (Temporal)   Ht '5\' 5"'  (1.651 m)   Wt 184 lb 9.6 oz (83.7 kg)   BMI 30.72 kg/m  General:   Alert and oriented. No distress noted. Pleasant and cooperative.  Head:  Normocephalic and atraumatic. Eyes:  Conjuctiva clear without scleral icterus. Mouth:  Mask in place Abdomen:  +BS, soft, non-tender and non-distended. No rebound or guarding. No HSM or masses noted. Msk:  Symmetrical without gross deformities. Normal posture. Extremities:  Without edema. Neurologic:  Alert and  oriented x4 Psych:  Alert and cooperative. Normal mood and affect.  ASSESSMENT: RAMATOULAYE PACK is a 80 y.o. female presenting today presenting today with a history of superficial invasive adenocarcinoma arising in background of a sessile serrated polyp in ascending colon in July 2021, seen by surgery and recommendations for early interval colonoscopy as site was not tatooed; therefore, she underwent colonoscopy Jan 2022 with diverticulosis and redundant colon. Due to age and cardiac condition, repeat colonoscopy cautioned; she was not a candidate for anesthesia at [facility]. CT chest/abd/pelvis on file Nov 2021 with stable 4 mm nodularity along right upper quadrant omentum, no progressive nodularity, and likely present since 2012.   Cirrhosis: subtle changes noted on imaging. EGD with Grade 1 esophageal varices. She is declining surveillance EGDs in future but willing to pursue US abdomen for hepatoma screening. She will need Hep A and B vaccinations. Well-compensated and likely due to  fatty liver. Hep C negative in 2014.   Constipation: not ideally managed. Will trial Linzess 290 mcg daily.   Dyspepsia: continue PPI daily. She notes belching and worsened reflux with eating raw vegetables, so we discussed foods to avoid and different methods of preparation.   PLAN:  Continue PPI daily Add Linzess 290 mcg daily US abdomen due in April 2022 Hep A/B vaccinations Declining serial EGDs Not an ideal candidate for continued endoscopic evaluation as noted above: Follow clinically Return in 6 months   Annitta Needs, PhD, [facility]

## 2020-08-05 NOTE — Telephone Encounter (Signed)
Patient supposed to be put on May recall for ultrasound but that recall has already been done

## 2020-08-05 NOTE — Telephone Encounter (Signed)
[place] appt mailed

## 2020-08-05 NOTE — Addendum Note (Signed)
Addended by: Cheron Every on: 08/05/2020 04:42 PM   Modules accepted: Orders

## 2020-08-05 NOTE — Patient Instructions (Addendum)
I recommend steaming, baking, boiling veggies. Avoid the ones that cause gas (broccoli, cabbage, etc).  We will order a liver ultrasound to be done in May 2022.  I recommend completing the Hepatitis A and B vaccinations.  Continue to choose healthy food options.   I sent Linzess to the pharmacy to take once each morning, 30 minutes before breakfast. We provided samples as well.  Continue Protonix once each day, 30 minutes before breakfast for best absorption.  We will see you in 6 months!   I enjoyed seeing you again today! As you know, I value our relationship and want to provide genuine, compassionate, and quality care. I welcome your feedback. If you receive a survey regarding your visit,  I greatly appreciate you taking time to fill this out. See you next time!  Annitta Needs, PhD, ANP-BC [facility]   Cirrhosis  Cirrhosis is long-term (chronic) liver injury. The liver is the body's largest internal organ, and it performs many functions. It converts food into energy, removes toxic material from the blood, makes important proteins, and absorbs necessary vitamins from food. In cirrhosis, healthy liver cells are replaced by scar tissue. This prevents blood from flowing through the liver and makes it difficult for the liver to complete its functions. What are the causes? Common causes of this condition are hepatitis C and long-term alcohol abuse. Other causes include:  Nonalcoholic fatty liver disease (NAFLD). This happens when fat is deposited in the liver by causes other than alcohol.  Hepatitis B infection.  Autoimmune hepatitis. In this condition, the body's defense system (immune system) mistakenly attacks the liver cells, causing inflammation.  Diseases that cause blockage of ducts inside the liver.  Inherited liver diseases, such as hemochromatosis. This is one of the most common inherited liver diseases. In this disease, deposits of iron collect in the liver  and other organs.  Reactions to certain long-term medicines, such as amiodarone, a heart medicine.  Parasitic infections. These include schistosomiasis, which is caused by a flatworm.  Long-term contact to certain toxins. These toxins include certain organic solvents, such as toluene and chloroform. What increases the risk? You are more likely to develop this condition if:  You have certain types of viral hepatitis.  You abuse alcohol, especially if you are female.  You are overweight.  You use IV drugs and share needles.  You have unprotected sex with someone who has viral hepatitis. What are the signs or symptoms? You may not have any signs and symptoms at first. Symptoms may not develop until the damage to your liver starts to get worse. Early symptoms may include:  Weakness and tiredness (fatigue).  Changes in sleep patterns or having trouble sleeping.  Itchiness.  Tenderness in the right-upper part of your abdomen.  Weight loss and muscle loss.  Nausea.  Loss of appetite. Later symptoms may include:  Fatigue or weakness that is getting worse.  Yellow skin and eyes (jaundice).  Buildup of fluid in the abdomen (ascites). You may notice that your clothes are tight around your waist.  Weight gain and swelling of the feet and ankles (edema).  Trouble breathing.  Easy bruising and bleeding.  Vomiting blood, or black or bloody stool.  Mental confusion. How is this diagnosed? Your health care provider may suspect cirrhosis based on your symptoms and medical history, especially if you have other medical conditions or a history of alcohol abuse. Your health care provider will do a physical exam to feel your liver and to  check for signs of cirrhosis. Tests may include:  Blood tests to check: ? For hepatitis B or C. ? Kidney function. ? Liver function.  Imaging tests such as: ? MRI or CT scan to look for changes seen in advanced cirrhosis. ? Ultrasound to see  if normal liver tissue is being replaced by scar tissue.  A procedure in which a long needle is used to take a sample of liver tissue to be checked in a lab (biopsy). Liver biopsy can confirm the diagnosis of cirrhosis. How is this treated? Treatment for this condition depends on how damaged your liver is and what caused the damage. It may include treating the symptoms of cirrhosis, or treating the underlying causes to slow the damage. Treatment may include:  Making lifestyle changes, such as: ? Eating a healthy diet. You may need to work with your health care provider or a dietitian to develop an eating plan. ? Restricting salt intake. ? Maintaining a healthy weight. ? Not abusing drugs or alcohol.  Taking medicines to: ? Treat liver infections or other infections. ? Control itching. ? Reduce fluid buildup. ? Reduce certain blood toxins. ? Reduce risk of bleeding from enlarged blood vessels in the stomach or esophagus (varices).  Liver transplant. In this procedure, a liver from a donor is used to replace your diseased liver. This is done if cirrhosis has caused liver failure. Other treatments and procedures may be done depending on the problems that you get from cirrhosis. Common problems include liver-related kidney failure (hepatorenal syndrome). Follow these instructions at home:  Take medicines only as told by your health care provider. Do not use medicines that are toxic to your liver. Ask your health care provider before taking any new medicines, including over-the-counter medicines such as NSAIDs.  Rest as needed.  Eat a well-balanced diet.  Limit your salt or water intake, if your health care provider asks you to do this.  Do not drink alcohol. This is especially important if you routinely take acetaminophen.  Keep all follow-up visits. This is important.   Contact a health care provider if you:  Have fatigue or weakness that is getting worse.  Develop swelling of the  hands, feet, or legs, or a buildup of fluid in the abdomen (ascites).  Have a fever or chills.  Develop loss of appetite.  Have nausea or vomiting.  Develop jaundice.  Develop easy bruising or bleeding. Get help right away if you:  Vomit bright red blood or a material that looks like coffee grounds.  Have blood in your stools.  Notice that your stools appear black and tarry.  Become confused.  Have chest pain or trouble breathing. These symptoms may represent a serious problem that is an emergency. Do not wait to see if the symptoms will go away. Get medical help right away. Call your local emergency services (911 in the [place] Do not drive yourself to the hospital. Summary  Cirrhosis is chronic liver injury. Common causes are hepatitis C and long-term alcohol abuse.  Tests used to diagnose cirrhosis include blood tests, imaging tests, and liver biopsy.  Treatment for this condition involves treating the underlying cause. Avoid alcohol, drugs, salt, and medicines that may damage your liver.  Get help right away if you vomit bright red blood or a material that looks like coffee grounds. This information is not intended to replace advice given to you by your health care provider. Make sure you discuss any questions you have with your health care  provider. Document Revised: 02/13/2020 Document Reviewed: 02/13/2020 Elsevier Patient Education  [place].

## 2020-08-09 ENCOUNTER — Encounter: Payer: Self-pay | Admitting: Gastroenterology

## 2020-08-11 NOTE — Progress Notes (Signed)
Cc'ed to pcp °

## 2020-08-12 ENCOUNTER — Ambulatory Visit (HOSPITAL_COMMUNITY): Payer: Medicare Other

## 2020-08-18 ENCOUNTER — Other Ambulatory Visit: Payer: Self-pay

## 2020-08-18 ENCOUNTER — Encounter: Payer: Self-pay | Admitting: Podiatry

## 2020-08-18 ENCOUNTER — Ambulatory Visit (INDEPENDENT_AMBULATORY_CARE_PROVIDER_SITE_OTHER): Payer: Medicare Other | Admitting: Podiatry

## 2020-08-18 DIAGNOSIS — M79674 Pain in right toe(s): Secondary | ICD-10-CM | POA: Diagnosis not present

## 2020-08-18 DIAGNOSIS — M79675 Pain in left toe(s): Secondary | ICD-10-CM | POA: Diagnosis not present

## 2020-08-18 DIAGNOSIS — B351 Tinea unguium: Secondary | ICD-10-CM | POA: Diagnosis not present

## 2020-08-18 DIAGNOSIS — E1142 Type 2 diabetes mellitus with diabetic polyneuropathy: Secondary | ICD-10-CM | POA: Diagnosis not present

## 2020-08-18 NOTE — Progress Notes (Signed)
She presents today chief complaint of painfully elongated toenails.  Objective: Ulcers remain palpable.  Toenails are long thick yellow dystrophic Lee mycotic painful palpation as well as debridement.  Assessment: Pain in limb secondary onychomycosis.  Plan: Debridement of toenails 1 through 5 bilateral.  She will follow up with Dr. Adah Perl.

## 2020-08-19 IMAGING — MG DIGITAL SCREENING BILAT W/ TOMO W/ CAD
6 of 12 series · 6 of 36 positions shown · non-contrast
Comparison: Previous exam(s).

ACR Breast Density Category a: The breast tissue is almost entirely
fatty.

CLINICAL DATA: Screening.

EXAM:
DIGITAL SCREENING BILATERAL MAMMOGRAM WITH TOMO AND CAD

[L MLO synth-2D (1 of 2)]
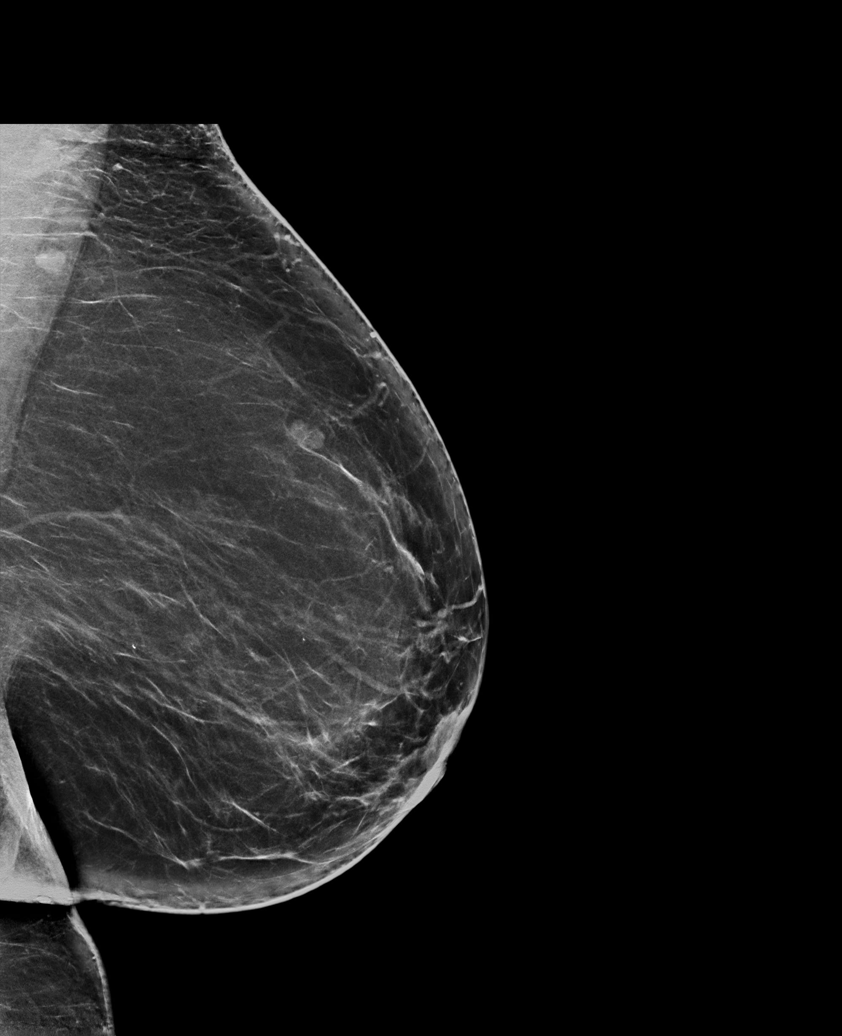

[R CC synth-2D]
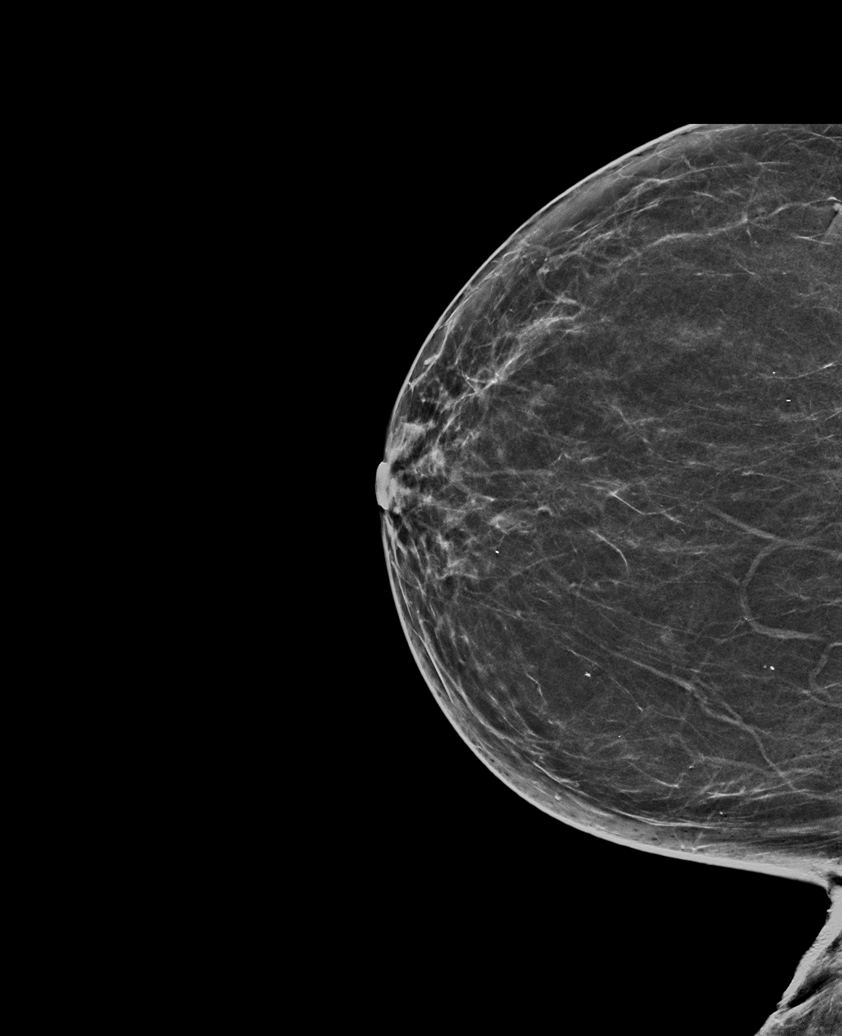

[L MLO synth-2D (2 of 2)]
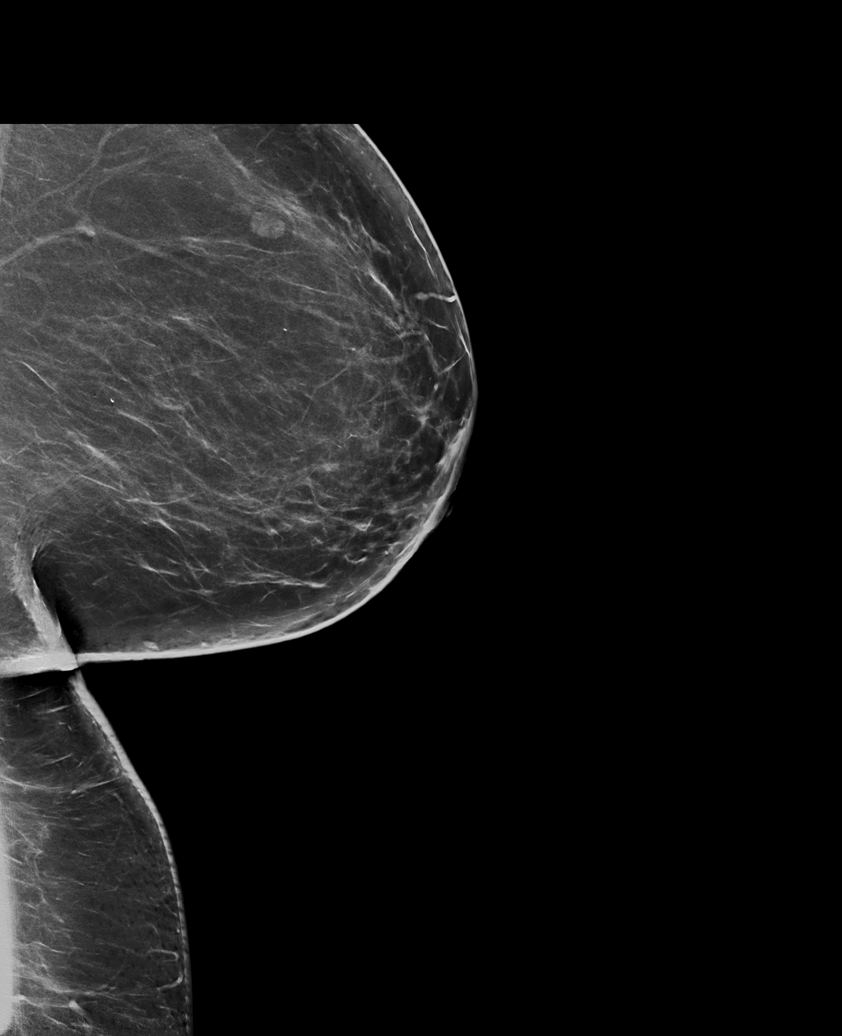

[R MLO synth-2D (1 of 2)]
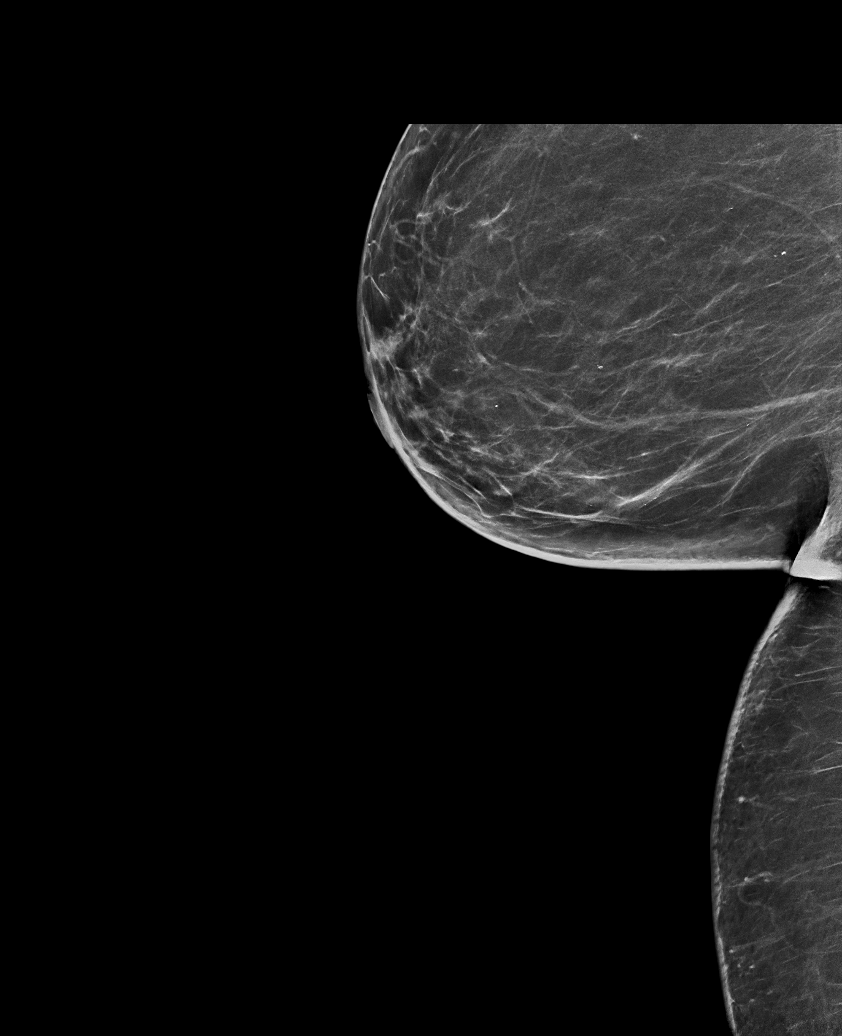

[L CC synth-2D]
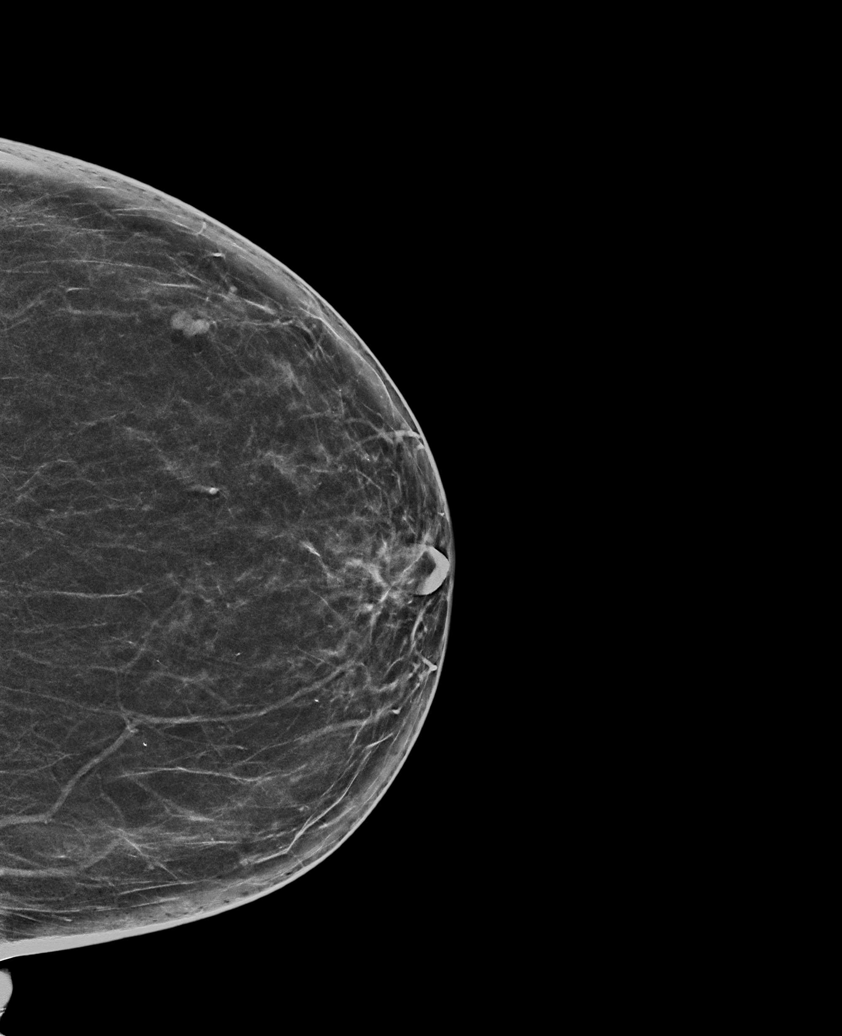

[R MLO synth-2D (2 of 2)]
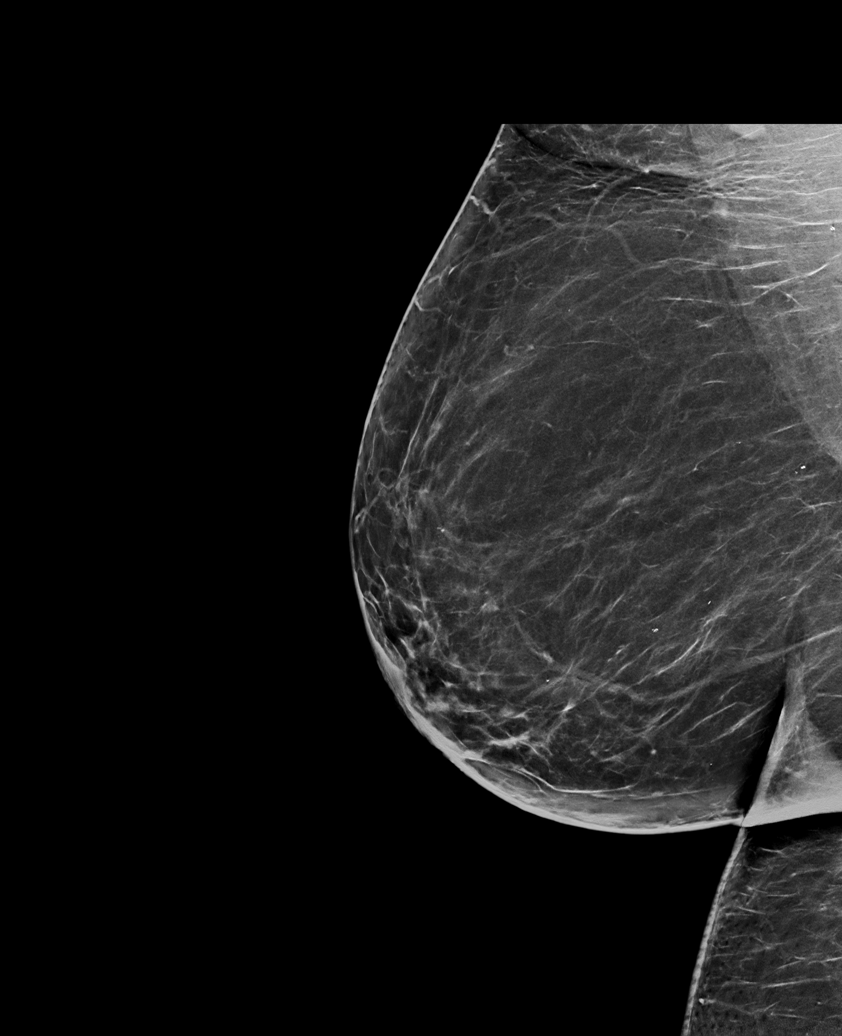

[6 of 36 positions shown; findings below may reference images not displayed]

FINDINGS: There are no findings suspicious for malignancy. Images were
processed with CAD.
IMPRESSION: No mammographic evidence of malignancy. A result letter of this
screening mammogram will be mailed directly to the patient.

RECOMMENDATION:
Screening mammogram in one year. (Code:[id number]-[id number])

BI-RADS CATEGORY  1: Negative.

## 2020-08-24 ENCOUNTER — Ambulatory Visit (HOSPITAL_COMMUNITY): Payer: Medicare Other

## 2020-08-26 ENCOUNTER — Other Ambulatory Visit: Payer: Self-pay

## 2020-08-26 ENCOUNTER — Ambulatory Visit (INDEPENDENT_AMBULATORY_CARE_PROVIDER_SITE_OTHER): Payer: Medicare Other | Admitting: Family Medicine

## 2020-08-26 ENCOUNTER — Encounter: Payer: Self-pay | Admitting: Family Medicine

## 2020-08-26 VITALS — BP 121/77 | HR 75 | Resp 15 | Ht 65.0 in | Wt 184.1 lb

## 2020-08-26 DIAGNOSIS — E782 Mixed hyperlipidemia: Secondary | ICD-10-CM | POA: Diagnosis not present

## 2020-08-26 DIAGNOSIS — Z794 Long term (current) use of insulin: Secondary | ICD-10-CM | POA: Diagnosis not present

## 2020-08-26 DIAGNOSIS — Z1231 Encounter for screening mammogram for malignant neoplasm of breast: Secondary | ICD-10-CM | POA: Diagnosis not present

## 2020-08-26 DIAGNOSIS — I350 Nonrheumatic aortic (valve) stenosis: Secondary | ICD-10-CM

## 2020-08-26 DIAGNOSIS — E1122 Type 2 diabetes mellitus with diabetic chronic kidney disease: Secondary | ICD-10-CM

## 2020-08-26 DIAGNOSIS — E663 Overweight: Secondary | ICD-10-CM

## 2020-08-26 DIAGNOSIS — I10 Essential (primary) hypertension: Secondary | ICD-10-CM | POA: Diagnosis not present

## 2020-08-26 DIAGNOSIS — Z85038 Personal history of other malignant neoplasm of large intestine: Secondary | ICD-10-CM | POA: Diagnosis not present

## 2020-08-26 DIAGNOSIS — N181 Chronic kidney disease, stage 1: Secondary | ICD-10-CM

## 2020-08-26 NOTE — Assessment & Plan Note (Signed)
Being actively managed by Cardiology, pt encouraged to discuss plan with MD next visit

## 2020-08-26 NOTE — Progress Notes (Signed)
Joanna Brown     MRN: [id number]      DOB: October 31, 1940   HPI Joanna Brown is here for follow up and re-evaluation of chronic medical conditions, medication management and review of any available recent lab and radiology data.  Preventive health is updated, specifically  Cancer screening and Immunization.   Questions or concerns regarding consultations or procedures which the Joanna Brown has had in the interim are  addressed. The Joanna Brown denies any adverse reactions to current medications since the last visit.  Has questions about[plan for her heart valve disease Relieved that colon cancer is resected question as to rept colonscopy frequency procedure was challenging States blood sugar is all over the place, has endo appt soon  ROS Denies recent fever or chills. Denies sinus pressure, nasal congestion, ear pain or sore throat. Denies chest congestion, productive cough or wheezing. Denies chest pains, palpitations and leg swelling Denies abdominal pain, nausea, vomiting,diarrhea or constipation.   Denies dysuria, frequency, hesitancy or incontinence. Denies joint pain, swelling and limitation in mobility. Denies headaches, seizures, numbness, or tingling. Denies depression, anxiety or insomnia. Denies skin break down or rash.   PE  BP 121/77   Pulse 75   Resp 15   Ht 5\' 5"  (1.651 m)   Wt 184 lb 1.9 oz (83.5 kg)   SpO2 95%   BMI 30.64 kg/m   Patient alert and oriented and in no cardiopulmonary distress.  HEENT: No facial asymmetry, EOMI,     Neck supple .  Chest: Clear to auscultation bilaterally.  CVS: S1, S2 systolic murmurs, no S3.Regular rate.  ABD: Soft non tender.   Ext: No edema  MS: Adequate ROM spine, shoulders, hips and knees.  Skin: Intact, no ulcerations or rash noted.  Psych: Good eye contact, normal affect. Memory intact not anxious or depressed appearing.  CNS: CN 2-12 intact, power,  normal throughout.no focal deficits noted.   Assessment & Plan  Essential  hypertension Controlled, no change in medication DASH diet and commitment to daily physical activity for a minimum of 30 minutes discussed and encouraged, as a part of hypertension management. The importance of attaining a healthy weight is also discussed.  BP/Weight 08/26/2020 08/05/2020 07/29/2020 06/10/2020 05/25/2020 05/11/2020 71/10/2692  Systolic BP 854 627 035 009 381 - 829  Diastolic BP 77 77 76 70 67 - 78  Wt. (Lbs) 184.12 184.6 186 182.8 180 183.8 180  BMI 30.64 30.72 30.95 30.42 29.95 30.59 29.95       Aortic valve stenosis Being actively managed by Cardiology, Joanna Brown encouraged to discuss plan with MD next visit  Mixed hyperlipidemia Hyperlipidemia:Low fat diet discussed and encouraged.   Lipid Panel  Lab Results  Component Value Date   CHOL 161 07/30/2019   HDL 34 (L) 07/30/2019   LDLCALC 99 07/30/2019   TRIG 179 (H) 07/30/2019   CHOLHDL 4.7 07/30/2019     Updated lab needed at/ before next visit.   Overweight (BMI 25.0-29.9)  Patient re-educated about  the importance of commitment to a  minimum of 150 minutes of exercise per week as able.  The importance of healthy food choices with portion control discussed, as well as eating regularly and within a 12 hour window most days. The need to choose "clean , green" food 50 to 75% of the time is discussed, as well as to make water the primary drink and set a goal of 64 ounces water daily.    Weight /BMI 08/26/2020 08/05/2020 07/29/2020  WEIGHT 184 lb  1.9 oz 184 lb 9.6 oz 186 lb  HEIGHT 5\' 5"  5\' 5"  5\' 5"   BMI 30.64 kg/m2 30.72 kg/m2 30.95 kg/m2      History of colon cancer Needs close gI follow up  Type 2 diabetes mellitus with stage 1 chronic kidney disease, with long-term current use of insulin ([place]) Joanna Brown is reminded of the importance of commitment to daily physical activity for 30 minutes or more, as able and the need to limit carbohydrate intake to 30 to 60 grams per meal to help with blood sugar control.    The need to take medication as prescribed, test blood sugar as directed, and to call between visits if there is a concern that blood sugar is uncontrolled is also discussed.   Joanna Brown is reminded of the importance of daily foot exam, annual eye examination, and good blood sugar, blood pressure and cholesterol control.  Diabetic Labs Latest Ref Rng & Units 06/10/2020 05/05/2020 03/10/2020 03/02/2020 12/05/2019  HbA1c 0.0 - 7.0 % 8.6(A) - 8.8(A) - -  Microalbumin mg/dL - - - - -  Micro/Creat Ratio <30 mcg/mg creat - - - - -  Chol <200 mg/dL - - - - -  HDL > OR = 50 mg/dL - - - - -  Calc LDL mg/dL (calc) - - - - -  Triglycerides <150 mg/dL - - - - -  Creatinine 0.40 - 1.20 mg/dL - 1.04 - 1.11(H) 1.30(H)  GFR >60.00 mL/min - 51.53(L) - - -   BP/Weight 08/26/2020 08/05/2020 07/29/2020 06/10/2020 05/25/2020 05/11/2020 47/42/5956  Systolic BP 387 564 332 951 884 - 166  Diastolic BP 77 77 76 70 67 - 78  Wt. (Lbs) 184.12 184.6 186 182.8 180 183.8 180  BMI 30.64 30.72 30.95 30.42 29.95 30.59 29.95   Foot/eye exam completion dates Latest Ref Rng & Units 10/24/2019 09/25/2018  Eye Exam No Retinopathy No Retinopathy No Retinopathy  Foot Form Completion - - -      F/u with Endo as planned

## 2020-08-26 NOTE — Assessment & Plan Note (Signed)
Needs close gI follow up

## 2020-08-26 NOTE — Assessment & Plan Note (Signed)
Hyperlipidemia:Low fat diet discussed and encouraged.   Lipid Panel  Lab Results  Component Value Date   CHOL 161 07/30/2019   HDL 34 (L) 07/30/2019   LDLCALC 99 07/30/2019   TRIG 179 (H) 07/30/2019   CHOLHDL 4.7 07/30/2019     Updated lab needed at/ before next visit.

## 2020-08-26 NOTE — Assessment & Plan Note (Signed)
Controlled, no change in medication DASH diet and commitment to daily physical activity for a minimum of 30 minutes discussed and encouraged, as a part of hypertension management. The importance of attaining a healthy weight is also discussed.  BP/Weight 08/26/2020 08/05/2020 07/29/2020 06/10/2020 05/25/2020 05/11/2020 73/41/9379  Systolic BP 024 097 353 299 242 - 683  Diastolic BP 77 77 76 70 67 - 78  Wt. (Lbs) 184.12 184.6 186 182.8 180 183.8 180  BMI 30.64 30.72 30.95 30.42 29.95 30.59 29.95

## 2020-08-26 NOTE — Assessment & Plan Note (Signed)
  Patient re-educated about  the importance of commitment to a  minimum of 150 minutes of exercise per week as able.  The importance of healthy food choices with portion control discussed, as well as eating regularly and within a 12 hour window most days. The need to choose "clean , green" food 50 to 75% of the time is discussed, as well as to make water the primary drink and set a goal of 64 ounces water daily.    Weight /BMI 08/26/2020 08/05/2020 07/29/2020  WEIGHT 184 lb 1.9 oz 184 lb 9.6 oz 186 lb  HEIGHT 5\' 5"  5\' 5"  5\' 5"   BMI 30.64 kg/m2 30.72 kg/m2 30.95 kg/m2

## 2020-08-26 NOTE — Patient Instructions (Addendum)
F/U in end August with MD , call if you need me before  Labs today, lipid, cmp and EGFr  Covid booster due 10/28/2020    Please schedule , mammogram at checkout  It is important that you exercise regularly at least 30 minutes 5 times a week. If you develop chest pain, have severe difficulty breathing, or feel very tired, stop exercising immediately and seek medical attention   Think about what you will eat, plan ahead. Choose " clean, green, fresh or frozen" over canned, processed or packaged foods which are more sugary, salty and fatty. 70 to 75% of food eaten should be vegetables and fruit. Three meals at set times with snacks allowed between meals, but they must be fruit or vegetables. Aim to eat over a 12 hour period , example 7 am to 7 pm, and STOP after  your last meal of the day. Drink water,generally about 64 ounces per day, no other drink is as healthy. Fruit juice is best enjoyed in a healthy way, by EATING the fruit.

## 2020-08-26 NOTE — Assessment & Plan Note (Signed)
[name] is reminded of the importance of commitment to daily physical activity for 30 minutes or more, as able and the need to limit carbohydrate intake to 30 to 60 grams per meal to help with blood sugar control.   The need to take medication as prescribed, test blood sugar as directed, and to call between visits if there is a concern that blood sugar is uncontrolled is also discussed.   [name] is reminded of the importance of daily foot exam, annual eye examination, and good blood sugar, blood pressure and cholesterol control.  Diabetic Labs Latest Ref Rng & Units 06/10/2020 05/05/2020 03/10/2020 03/02/2020 12/05/2019  HbA1c 0.0 - 7.0 % 8.6(A) - 8.8(A) - -  Microalbumin mg/dL - - - - -  Micro/Creat Ratio <30 mcg/mg creat - - - - -  Chol <200 mg/dL - - - - -  HDL > OR = 50 mg/dL - - - - -  Calc LDL mg/dL (calc) - - - - -  Triglycerides <150 mg/dL - - - - -  Creatinine 0.40 - 1.20 mg/dL - 1.04 - 1.11(H) 1.30(H)  GFR >60.00 mL/min - 51.53(L) - - -   BP/Weight 08/26/2020 08/05/2020 07/29/2020 06/10/2020 05/25/2020 05/11/2020 54/65/0354  Systolic BP 656 812 751 700 174 - 944  Diastolic BP 77 77 76 70 67 - 78  Wt. (Lbs) 184.12 184.6 186 182.8 180 183.8 180  BMI 30.64 30.72 30.95 30.42 29.95 30.59 29.95   Foot/eye exam completion dates Latest Ref Rng & Units 10/24/2019 09/25/2018  Eye Exam No Retinopathy No Retinopathy No Retinopathy  Foot Form Completion - - -      F/u with Endo as planned

## 2020-08-27 LAB — CMP14+EGFR
ALT: 27 IU/L (ref 0–32)
AST: 28 IU/L (ref 0–40)
Albumin/Globulin Ratio: 1.4 (ref 1.2–2.2)
Albumin: 4.6 g/dL (ref 3.7–4.7)
Alkaline Phosphatase: 60 IU/L (ref 44–121)
BUN/Creatinine Ratio: 16 (ref 12–28)
BUN: 20 mg/dL (ref 8–27)
Bilirubin Total: 0.3 mg/dL (ref 0.0–1.2)
CO2: 25 mmol/L (ref 20–29)
Calcium: 10.4 mg/dL — ABNORMAL HIGH (ref 8.7–10.3)
Chloride: 94 mmol/L — ABNORMAL LOW (ref 96–106)
Creatinine, Ser: 1.22 mg/dL — ABNORMAL HIGH (ref 0.57–1.00)
Globulin, Total: 3.2 g/dL (ref 1.5–4.5)
Glucose: 152 mg/dL — ABNORMAL HIGH (ref 65–99)
Potassium: 4.6 mmol/L (ref 3.5–5.2)
Sodium: 135 mmol/L (ref 134–144)
Total Protein: 7.8 g/dL (ref 6.0–8.5)
eGFR: 45 mL/min/{1.73_m2} — ABNORMAL LOW (ref >59)

## 2020-08-27 LAB — LIPID PANEL
Chol/HDL Ratio: 4.3 ratio (ref 0.0–4.4)
Cholesterol, Total: 176 mg/dL (ref 100–199)
HDL: 41 mg/dL (ref >39)
LDL Chol Calc (NIH): 101 mg/dL — ABNORMAL HIGH (ref 0–99)
Triglycerides: 194 mg/dL — ABNORMAL HIGH (ref 0–149)
VLDL Cholesterol Cal: 34 mg/dL (ref 5–40)

## 2020-09-02 ENCOUNTER — Ambulatory Visit: Payer: Medicare Other | Admitting: Podiatry

## 2020-09-08 ENCOUNTER — Encounter: Payer: Self-pay | Admitting: "Endocrinology

## 2020-09-08 ENCOUNTER — Ambulatory Visit (INDEPENDENT_AMBULATORY_CARE_PROVIDER_SITE_OTHER): Payer: Medicare Other | Admitting: "Endocrinology

## 2020-09-08 ENCOUNTER — Other Ambulatory Visit: Payer: Self-pay

## 2020-09-08 VITALS — BP 110/68 | HR 64 | Ht 65.0 in | Wt 184.2 lb

## 2020-09-08 DIAGNOSIS — Z794 Long term (current) use of insulin: Secondary | ICD-10-CM | POA: Diagnosis not present

## 2020-09-08 DIAGNOSIS — I10 Essential (primary) hypertension: Secondary | ICD-10-CM | POA: Diagnosis not present

## 2020-09-08 DIAGNOSIS — E782 Mixed hyperlipidemia: Secondary | ICD-10-CM

## 2020-09-08 DIAGNOSIS — E1122 Type 2 diabetes mellitus with diabetic chronic kidney disease: Secondary | ICD-10-CM | POA: Diagnosis not present

## 2020-09-08 DIAGNOSIS — N181 Chronic kidney disease, stage 1: Secondary | ICD-10-CM | POA: Diagnosis not present

## 2020-09-08 LAB — POCT GLYCOSYLATED HEMOGLOBIN (HGB A1C): HbA1c, POC (controlled diabetic range): 8.4 % — AB (ref 0.0–7.0)

## 2020-09-08 NOTE — Progress Notes (Signed)
09/08/2020                          Endocrinology follow-up note   Subjective:    Patient ID: Joanna Brown, female    DOB: 1941-01-25. She is being seen in follow-up for uncontrolled type 2 diabetes complicated by CKD, hyperlipidemia, hypertension.    PMD:   Fayrene Helper, MD  Past Medical History:  Diagnosis Date  . Arthritis    OA  . Blood transfusion    1980  . Diabetes mellitus   . Dysrhythmia   . Female bladder prolapse   . GERD 11/18/2008   Qualifier: Diagnosis of  By: Craige Cotta    . GERD (gastroesophageal reflux disease)   . Glaucoma   . Heart disease   . Hyperlipidemia   . Hypertension    echo and stress 4/10 reports on chart, EKG ` LOV 9/12 on chart  . Mild aortic stenosis   . Thyroid disease    Past Surgical History:  Procedure Laterality Date  . ABDOMINAL HYSTERECTOMY    . ANTERIOR AND POSTERIOR REPAIR  04/26/2011   Procedure: ANTERIOR (CYSTOCELE) AND POSTERIOR REPAIR (RECTOCELE);  Surgeon: Reece Packer, MD;  Location: [facility];  Service: Urology;  Laterality: N/A;  . BIOPSY  05/25/2020   Procedure: BIOPSY;  Surgeon: Doran Stabler, MD;  Location: [facility];  Service: Gastroenterology;;  . CATARACT EXTRACTION Bilateral    with IOL  . CHOLECYSTECTOMY  2009  . COLONOSCOPY N/A 12/02/2013   three colon polyps removed, small internal hemorrhoids. Hyperplastic polyps  . COLONOSCOPY N/A 11/20/2019   pancolonic diverticulosis, two 10-11 mm polyps in ascending colon, one 5 mm polyp in cecum, ascending colon with superficially invasive adenocarcinoma arising in background of sessile serrated polyps with low and high grade cytologic dysplasia.  . COLONOSCOPY    . COLONOSCOPY W/ POLYPECTOMY    . COLONOSCOPY WITH PROPOFOL N/A 05/25/2020   diverticulosis in right colon, redundant colon. Caution warranted on future colonoscopy in light of age, cardiac condition, challenging anatomy.   . ESOPHAGOGASTRODUODENOSCOPY (EGD) WITH PROPOFOL N/A 05/25/2020    Grade 1 esophageal varices, single mucosal nodule in stomach s/p biopsy. (hyperplastic)>   . LEFT HEART CATH  09/10/2008   normal coronary arteries, normal LV systolic function, EF 25% (Dr. [name])  . LYMPH NODE DISSECTION Right 1997   under arm  . NM MYOCAR PERF WALL MOTION  2010   dipyridamole - mild-mod in intenstiy perfusion defect in mid anterior, mid anteroseptal wall, EF 70%  . OVARY SURGERY     bilateral tumors removed  . POLYPECTOMY  11/20/2019   Procedure: POLYPECTOMY;  Surgeon: Daneil Dolin, MD;  [facility];  Service: Endoscopy;;  hot and cold snare cecal polyp, and asending polyps x 2  . THYROIDECTOMY    . THYROIDECTOMY  02/2008  . TRANSTHORACIC ECHOCARDIOGRAM  08/2011   EF=>55%, mild conc LVH; trace MR; mild TR; mild-mod AV calcification with mild valvular AV stenosis  . VAGINAL PROLAPSE REPAIR  04/26/2011   Procedure: VAGINAL VAULT SUSPENSION;  Surgeon: Reece Packer, MD;  Location: [facility];  Service: Urology;  Laterality: N/A;  with Graft  10x6   Social History   Socioeconomic History  . Marital status: Married    Spouse name: [name]  . Number of children: 1  . Years of education: Trade  . Highest education level: 12th grade  Occupational History  . Occupation: Retired  Tobacco Use  . Smoking status: Never Smoker  . Smokeless tobacco: Never Used  Vaping Use  . Vaping Use: Never used  Substance and Sexual Activity  . Alcohol use: No    Comment: socially- none x 30 years  . Drug use: No  . Sexual activity: Yes  Other Topics Concern  . Not on file  Social History Narrative   Patient lives at home with spouse. 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 LIVED IN [place] FOR 50 YRS AND CAME BACK TO [place] ~2009. Caffeine Use: 1 cup of coffee daily. HAD ONE CHILD: PASSED 5 YRS. HAVE THREE GRAND-KIDS AND TWO GREAT GRANDS.    Social Determinants of Health   Financial Resource Strain: Low Risk   . Difficulty of Paying Living  Expenses: Not hard at all  Food Insecurity: No Food Insecurity  . Worried About Charity fundraiser in the Last Year: Never true  . Ran Out of Food in the Last Year: Never true  Transportation Needs: No Transportation Needs  . Lack of Transportation (Medical): No  . Lack of Transportation (Non-Medical): No  Physical Activity: Sufficiently Active  . Days of Exercise per Week: 5 days  . Minutes of Exercise per Session: 30 min  Stress: No Stress Concern Present  . Feeling of Stress : Not at all  Social Connections: Moderately Integrated  . Frequency of Communication with Friends and Family: More than three times a week  . Frequency of Social Gatherings with Friends and Family: Twice a week  . Attends Religious Services: More than 4 times per year  . Active Member of Clubs or Organizations: No  . Attends Archivist Meetings: Never  . Marital Status: Married   Outpatient Encounter Medications as of 09/08/2020  Medication Sig  . ALPHAGAN P 0.1 % SOLN Place 1 drop into both eyes 2 (two) times daily.  Marland Kitchen amLODipine (NORVASC) 10 MG tablet TAKE 1 TABLET(10 MG) BY MOUTH EVERY MORNING  . Ascorbic Acid (VITAMIN C) 1000 MG tablet Take 1,000 mg by mouth 4 (four) times a week.  Marland Kitchen aspirin 81 MG tablet Take 81 mg by mouth every other day.   . benazepril (LOTENSIN) 40 MG tablet TAKE 1 TABLET(40 MG) BY MOUTH DAILY  . Blood Glucose Monitoring Suppl (ACCU-CHEK GUIDE) w/Device KIT 1 each by Does not apply route 4 (four) times daily.  . Calcium Carbonate-Vit D-Min (CALCIUM 1200) 1200-1000 MG-UNIT CHEW Chew 1 each by mouth daily.  . cholecalciferol (VITAMIN D3) 25 MCG (1000 UT) tablet Take 1,000 Units by mouth daily.  . clotrimazole-betamethasone (LOTRISONE) cream Apply twice daily for 1 week, to affected area , then as needed  . ezetimibe (ZETIA) 10 MG tablet TAKE 1 TABLET(10 MG) BY MOUTH DAILY (Patient taking differently: Take 10 mg by mouth daily.)  . glipiZIDE (GLUCOTROL XL) 5 MG 24 hr tablet Take  1 tablet (5 mg total) by mouth daily with breakfast.  . glucose blood (ACCU-CHEK GUIDE) test strip USE TO CHECK BLOOD SUGAR TWICE DAILY  . Insulin Pen Needle (B-D ULTRAFINE III SHORT PEN) 31G X 8 MM MISC USE AS DIRECTED TWO TIMES DAILY WITH INSULIN PENS  . Lancets Ultra Fine MISC 1 each by Does not apply route 4 (four) times daily.  Marland Kitchen latanoprost (XALATAN) 0.005 % ophthalmic solution Place 1 drop into both eyes at bedtime.   . metFORMIN (GLUCOPHAGE) 500 MG tablet TAKE 1 TABLET BY MOUTH EVERY DAY WITH BREAKFAST (Patient taking differently: Take 500 mg  by mouth daily with breakfast.)  . Multiple Vitamins-Minerals (CENTRUM) tablet Take 1 tablet by mouth every morning.   . Omega-3 Fatty Acids (FISH OIL) 1200 MG CAPS Take 1 capsule by mouth every morning.   . pantoprazole (PROTONIX) 40 MG tablet Take 1 tablet (40 mg total) by mouth daily. 30 minutes before meal  . polyethylene glycol (MIRALAX) 17 g packet Take 17 g by mouth at bedtime.  . pravastatin (PRAVACHOL) 80 MG tablet TAKE 1 TABLET(80 MG) BY MOUTH DAILY (Patient taking differently: Take 80 mg by mouth daily.)  . triamterene-hydrochlorothiazide (MAXZIDE) 75-50 MG tablet TAKE 1 TABLET BY MOUTH DAILY  . [DISCONTINUED] insulin detemir (LEVEMIR FLEXTOUCH) 100 UNIT/ML FlexPen Inject 60 Units into the skin 2 (two) times daily.   No facility-administered encounter medications on file as of 09/08/2020.   ALLERGIES: Allergies  Allergen Reactions  . Senokot Wheat Bran [Wheat Bran]     ABDOMINAL CRAMPS  . Spironolactone     Stomach problems, vision changes    VACCINATION STATUS: Immunization History  Administered Date(s) Administered  . Moderna Sars-Covid-2 Vaccination 09/13/2019, 10/12/2019, 04/29/2020  . Pneumococcal Conjugate-13 12/11/2013  . Pneumococcal Polysaccharide-23 01/13/2010  . Tdap 10/05/2010    Diabetes She presents for her follow-up diabetic visit. She has type 2 diabetes mellitus. Onset time: She was diagnosed at approximate  age of 2 years. Her disease course has been improving. There are no hypoglycemic associated symptoms. Pertinent negatives for hypoglycemia include no confusion, headaches, pallor or seizures. Pertinent negatives for diabetes include no blurred vision, no chest pain, no fatigue, no polydipsia, no polyphagia and no polyuria. There are no hypoglycemic complications. Symptoms are improving. Diabetic complications include nephropathy and retinopathy. Risk factors for coronary artery disease include dyslipidemia, diabetes mellitus, obesity and sedentary lifestyle. Her weight is fluctuating minimally. She is following a generally unhealthy diet. When asked about meal planning, she reported none. She has had a previous visit with a dietitian. She rarely participates in exercise. Her home blood glucose trend is increasing steadily. Her breakfast blood glucose range is generally 140-180 mg/dl. Her bedtime blood glucose range is generally 180-200 mg/dl. Her overall blood glucose range is 180-200 mg/dl. (She presents with near target glycemic profile, continued improvement with point-of-care A1c of 8.4% slowly improving from 9.4%.  She did not document or report hypoglycemia.   ) Eye exam is current.  Hyperlipidemia This is a chronic problem. The current episode started more than 1 year ago. The problem is uncontrolled. Exacerbating diseases include diabetes and obesity. Pertinent negatives include no chest pain, myalgias or shortness of breath. Current antihyperlipidemic treatment includes statins. Risk factors for coronary artery disease include dyslipidemia, diabetes mellitus, hypertension, obesity, a sedentary lifestyle and post-menopausal.  Hypertension This is a chronic problem. The current episode started more than 1 year ago. Pertinent negatives include no blurred vision, chest pain, headaches, palpitations or shortness of breath. Risk factors for coronary artery disease include dyslipidemia, diabetes mellitus,  obesity and sedentary lifestyle. Hypertensive end-organ damage includes retinopathy.   Nodular goiter She Patient is being seen today for a new issue with her thyroid.  She was found to have 2.6 cm nodule on right lobe of her thyroid while undergoing CT scan of the chest during cancer surveillance.  She has previous history of left hemithyroidectomy approximately 10 years ago for multinodular goiter.  She is not on thyroid hormone supplement or replacement.  She denies dysphagia, shortness of breath, nor voice change. -Her subsequent thyroid ultrasound confirms multinodular right lobe of the  thyroid with surgically absent left lobe.  Her right thyroid lobe nodule did not meet criteria for biopsy.  Review of systems  Constitutional: + Minimally fluctuating body weight, current  Body mass index is 30.65 kg/m. , no fatigue, no subjective hyperthermia, no subjective hypothermia    Objective:    BP 110/68   Pulse 64   Ht '5\' 5"'  (1.651 m)   Wt 184 lb 3.2 oz (83.6 kg)   BMI 30.65 kg/m   Wt Readings from Last 3 Encounters:  09/08/20 184 lb 3.2 oz (83.6 kg)  08/26/20 184 lb 1.9 oz (83.5 kg)  08/05/20 184 lb 9.6 oz (83.7 kg)    Physical Exam- Limited  Constitutional:  Body mass index is 30.65 kg/m. , not in acute distress, normal state of mind    CMP ( most recent) CMP     Component Value Date/Time   NA 135 08/26/2020 0921   K 4.6 08/26/2020 0921   CL 94 (L) 08/26/2020 0921   CO2 25 08/26/2020 0921   GLUCOSE 152 (H) 08/26/2020 0921   GLUCOSE 104 (H) 05/05/2020 1029   BUN 20 08/26/2020 0921   CREATININE 1.22 (H) 08/26/2020 0921   CREATININE 1.11 (H) 03/02/2020 0848   CALCIUM 10.4 (H) 08/26/2020 0921   PROT 7.8 08/26/2020 0921   ALBUMIN 4.6 08/26/2020 0921   AST 28 08/26/2020 0921   ALT 27 08/26/2020 0921   ALKPHOS 60 08/26/2020 0921   BILITOT 0.3 08/26/2020 0921   GFRNONAA 57 (L) 07/30/2019 0718   GFRAA 66 07/30/2019 0718    Diabetic Labs (most recent): Lab Results   Component Value Date   HGBA1C 8.4 (A) 09/08/2020   HGBA1C 8.6 (A) 06/10/2020   HGBA1C 8.8 (A) 03/10/2020    Lipid Panel     Component Value Date/Time   CHOL 176 08/26/2020 0921   TRIG 194 (H) 08/26/2020 0921   HDL 41 08/26/2020 0921   CHOLHDL 4.3 08/26/2020 0921   CHOLHDL 4.7 07/30/2019 0718   VLDL 39 12/05/2017 0832   LDLCALC 101 (H) 08/26/2020 0921   LDLCALC 99 07/30/2019 0718   Incidental finding on chest CT on December 05, 2019 Enlarged  multinodular remnant right thyroid with dominant 2.6 cm hypodense Nodule.   Thyroid ultrasound on December 19, 2019: Right lobe 4.4 cm, left lobe absent surgically. No adenopathy  IMPRESSION: Surgical changes of left hemithyroidectomy. Multinodular thyroid. 3.9 cm and 1.6 cm cystic/almost completely cystic nodules with smooth margins, No thyroid nodule meets criteria for biopsy or surveillance, as designated by the newly established ACR TI-RADS criteria.   Assessment & Plan:     1.  Nodular goiter: Patient with remote past history of left hemithyroidectomy for multinodular goiter.  She did not require thyroid hormone supplement.  She was incidentally found to have 2.6 cm nodule in the right lobe. -Her subsequent dedicated thyroid ultrasound confirmed 2 nodules on the right lobe of her thyroid which did not meet any criteria for biopsy. -She would not need any antithyroid intervention at this time, TFTs are consistent with euthyroid state.  She will be considered for repeat ultrasound in 1 year.  2. Type 2 diabetes mellitus with stage 1 chronic kidney disease, with long-term current use of insulin ([place])   She presents with near target glycemic profile, continued improvement with point-of-care A1c of 8.4% slowly improving from 9.4%.  She did not document or report hypoglycemia.    Recent labs reviewed, showing improving renal function.     Her diabetes is  complicated by CKD obesity/sedentary life and patient remains at a high risk for  more acute and chronic complications of diabetes which include CAD, CVA, CKD, retinopathy, and neuropathy. These are all discussed in detail with the patient.  - I have counseled the patient on diet management and weight loss, by adopting a carbohydrate restricted/protein rich diet.  -She still admits to dietary indiscretions including consumption of sweets and sweetened beverages. - she acknowledges that there is a room for improvement in her food and drink choices. - Suggestion is made for her to avoid simple carbohydrates  from her diet including Cakes, Sweet Desserts, Ice Cream, Soda (diet and regular), Sweet Tea, Candies, Chips, Cookies, Store Bought Juices, Alcohol in Excess of  1-2 drinks a day, Artificial Sweeteners,  Coffee Creamer, and "Sugar-free" Products, Lemonade. This will help patient to have more stable blood glucose profile and potentially avoid unintended weight gain.   - I encouraged the patient to switch to  unprocessed or minimally processed complex starch and increased protein intake (animal or plant source), fruits, and vegetables.  - Patient is advised to stick to a routine mealtimes to eat 3 meals  a day and avoid unnecessary snacks ( to snack only to correct hypoglycemia).   - I have approached patient with the following individualized plan to manage diabetes and patient agrees:   -In light of her presentation with above target glycemic profile, she will continue to need multiple daily injections of insulin in order for her to achieve and maintain control of diabetes to target    -This patient will be overwhelmed with basal/bolus insulin regimen.  -She is allowed to finish her Levemir is using 60 units twice daily at breakfast and at bedtime. -After she finishes her current supplies of Levemir, she will be switched to NovoLog 70/30 to use 60 units twice daily AC. -She is encouraged to call continue to monitor blood glucose at least twice a day-daily before breakfast and  at bedtime. -Patient is encouraged to call clinic for blood glucose levels less than 70 or above 200 mg /dl. -She is advised to to continue glipizide 5 mg XL p.o. daily at breakfast.   -She will continue to benefit from low-dose Metformin, advised to continue Metformin 500 mg p.o. daily at breakfast.    - Patient specific target  A1c;  LDL, HDL, Triglycerides, were discussed in detail.  3) BP/HTN:  -Her blood pressure is controlled to target.   She is advised to continue her current blood pressure medications including benazepril 40 mg p.o. daily.   4) Lipids/HPL: Her recent lipid panel showed uncontrolled LDL at 97.  She is advised to continue pravastatin 80 mg p.o. nightly.   Side effects and precautions discussed with her.           5)  Weight/Diet: Her BMI is 30.65- -she is a candidate for moderate weight loss.  CDE Consult has been initiated , exercise, and detailed carbohydrates information provided.  6) Chronic Care/Health Maintenance:  -Patient is on ACEI/ARB and Statin medications and encouraged to continue to follow up with Ophthalmology, Podiatrist at least yearly or according to recommendations, and advised to  stay away from smoking. I have recommended yearly flu vaccine and pneumonia vaccination at least every 5 years; moderate intensity exercise for up to 150 minutes weekly; and  sleep for at least 7 hours a day.  POCT ABI for PAD screen was normal on June 10, 2020.  This test will be repeated in January 2027,  or sooner if needed.     - I advised patient to maintain close follow up with Fayrene Helper, MD for primary care needs.    I spent 41 minutes in the care of the patient today including review of labs from [place], Lipids, Thyroid Function, Hematology (current and previous including abstractions from other facilities); face-to-face time discussing  her blood glucose readings/logs, discussing hypoglycemia and hyperglycemia episodes and symptoms, medications doses,  her options of short and long term treatment based on the latest standards of care / guidelines;  discussion about incorporating lifestyle medicine;  and documenting the encounter.    Please refer to Patient Instructions for Blood Glucose Monitoring and Insulin/Medications Dosing Guide"  in media tab for additional information. Please  also refer to " Patient Self Inventory" in the Media  tab for reviewed elements of pertinent patient history.  Joanna Brown participated in the discussions, expressed understanding, and voiced agreement with the above plans.  All questions were answered to her satisfaction. she is encouraged to contact clinic should she have any questions or concerns prior to her return visit.    Follow up plan: - Return in about 7 weeks (around 10/27/2020) for F/U with Meter and Logs Only - no Labs.  Glade Lloyd, MD Phone: [phone]  Fax: [phone]   This note was partially dictated with voice recognition software. Similar sounding words can be transcribed inadequately or may not  be corrected upon review.  09/08/2020, 9:48 AM

## 2020-09-08 NOTE — Progress Notes (Signed)
Pt requesting Rx refills for insulin and diabetic testing supplies.

## 2020-09-08 NOTE — Patient Instructions (Signed)

## 2020-09-14 ENCOUNTER — Other Ambulatory Visit: Payer: Self-pay | Admitting: "Endocrinology

## 2020-09-14 DIAGNOSIS — E1122 Type 2 diabetes mellitus with diabetic chronic kidney disease: Secondary | ICD-10-CM

## 2020-09-14 DIAGNOSIS — Z794 Long term (current) use of insulin: Secondary | ICD-10-CM

## 2020-09-14 DIAGNOSIS — N181 Chronic kidney disease, stage 1: Secondary | ICD-10-CM

## 2020-09-24 ENCOUNTER — Other Ambulatory Visit: Payer: Self-pay

## 2020-09-24 ENCOUNTER — Ambulatory Visit (INDEPENDENT_AMBULATORY_CARE_PROVIDER_SITE_OTHER): Payer: Medicare Other | Admitting: Internal Medicine

## 2020-09-24 ENCOUNTER — Encounter: Payer: Self-pay | Admitting: Internal Medicine

## 2020-09-24 VITALS — BP 140/72 | HR 69 | Ht 65.0 in | Wt 184.0 lb

## 2020-09-24 DIAGNOSIS — Z794 Long term (current) use of insulin: Secondary | ICD-10-CM

## 2020-09-24 DIAGNOSIS — I350 Nonrheumatic aortic (valve) stenosis: Secondary | ICD-10-CM | POA: Diagnosis not present

## 2020-09-24 DIAGNOSIS — I200 Unstable angina: Secondary | ICD-10-CM | POA: Diagnosis not present

## 2020-09-24 DIAGNOSIS — N181 Chronic kidney disease, stage 1: Secondary | ICD-10-CM

## 2020-09-24 DIAGNOSIS — E1122 Type 2 diabetes mellitus with diabetic chronic kidney disease: Secondary | ICD-10-CM | POA: Diagnosis not present

## 2020-09-24 DIAGNOSIS — R079 Chest pain, unspecified: Secondary | ICD-10-CM | POA: Diagnosis not present

## 2020-09-24 DIAGNOSIS — Z01818 Encounter for other preprocedural examination: Secondary | ICD-10-CM

## 2020-09-24 DIAGNOSIS — Z01812 Encounter for preprocedural laboratory examination: Secondary | ICD-10-CM

## 2020-09-24 DIAGNOSIS — R0602 Shortness of breath: Secondary | ICD-10-CM

## 2020-09-24 NOTE — Patient Instructions (Addendum)
Medication Instructions:  Your physician recommends that you continue on your current medications as directed. Please refer to the Current Medication list given to you today.  *If you need a refill on your cardiac medications before your next appointment, please call your pharmacy*   Lab Work: BMET & CBC to be completed before heart catheterization   Due to COVID-19 restrictions implemented by our local and state authorities and in an effort to keep both patients and staff as safe as possible, our hospital system requires COVID-19 testing prior to certain scheduled hospital procedures.  Please go to [place] [place], [place] [address] on Tuesday May 17 at 1:15pm  This is a drive up testing site - please tell then you are there for pre-procedure testing.  You will not need to exit your vehicle.  You will not be billed at the time of testing but may receive a bill later depending on your insurance. You must agree to self-quarantine from the time of your testing until the procedure date on May 20.  This should included staying home with ONLY the people you live with.  Avoid eating out, grocery store shopping or leaving the house for any non-emergent reason, for example.  Failure to have your COVID-19 test done on the date and time you have been scheduled will result in cancellation of your procedure.  Please call our office at [phone] if you have any questions.   If you have labs (blood work) drawn today and your tests are completely normal, you will receive your results only by: Marland Kitchen MyChart Message (if you have MyChart) OR . A paper copy in the mail If you have any lab test that is abnormal or we need to change your treatment, we will call you to review the results.   Testing/Procedures: Right & Left Heart Catheterization @ [facility]   Follow-Up: At [facility], you and your health needs are our priority.  As part of our continuing mission to provide you with exceptional heart  care, we have created designated Provider Care Teams.  These Care Teams include your primary Cardiologist (physician) and Advanced Practice Providers (APPs -  Physician Assistants and Nurse Practitioners) who all work together to provide you with the care you need, when you need it.  We recommend signing up for the patient portal called "MyChart".  Sign up information is provided on this After Visit Summary.  MyChart is used to connect with patients for Virtual Visits (Telemedicine).  Patients are able to view lab/test results, encounter notes, upcoming appointments, etc.  Non-urgent messages can be sent to your provider as well.   To learn more about what you can do with MyChart, go to [email or web address].    Your next appointment:   3-4 week(s) - after procedure  The format for your next appointment:   In Person  Provider:   You may see [name], MD or one of the following Advanced Practice Providers on your designated Care Team:    Almyra Deforest, PA-C  Fabian Sharp, [place] or   Roby Lofts, [place]    Other Instructions          [place] [address] Dept: [phone] Loc: [place]  09/24/2020  You are scheduled for a Cardiac Catheterization on Friday Oct 02, 2020 with Dr.Thomas Claiborne Billings.  1. Please arrive at the [facility] (Main Entrance A) at [facility]  Hospital: [place], [place] [address] at 8:30am (This time is two hours before your procedure to ensure your preparation). Free valet parking service is available.   Special note: Every effort is made to have your procedure done on time. Please understand that emergencies sometimes delay scheduled procedures.  2. Diet: Do not eat solid foods after midnight.  The patient may have clear liquids until 5am upon the day of the procedure.  3. Labs: CBC & BMET done today  09/24/20  COVID screening to be done on Tuesday May 17 @ 1:15pm  4. Medication instructions in preparation for your procedure:  Do not take glipizide the day of the procedure.  Do not take Diabetes Med Glucophage (Metformin) on the day of the procedure and HOLD 48 HOURS AFTER THE PROCEDURE.  On the morning of your procedure, take your Aspirin and any morning medicines NOT listed above.  You may use sips of water.  5. Plan for one night stay--bring personal belongings. 6. Bring a current list of your medications and current insurance cards. 7. You MUST have a responsible person to drive you home. 8. Someone MUST be with you the first 24 hours after you arrive home or your discharge will be delayed. 9. Please wear clothes that are easy to get on and off and wear slip-on shoes.  Thank you for allowing [place] to care for you!   -- [facility]

## 2020-09-24 NOTE — Progress Notes (Signed)
OFFICE NOTE  Chief Complaint:  Chest pain, dyspnea  Primary Care Physician: Fayrene Helper, MD  HPI:  Joanna Brown is a 80 year old female who has a history of mild aortic stenosis with a valve area of approximately 1.7 cm, also a history of dyslipidemia and hypertension, both of which have been well controlled. We are seeing her back for an annual visit today. She reports actually feeling very well, started walking every day, has made major changes to her diet as reflected by a marked decrease in triglycerides. Unfortunately recently she had a mild elevation in liver enzymes slightly above normal on lovastatin. Typically we could tolerate liver enzymes up to 3 times normal on statin medications, however, she was taken off the lovastatin. Either way it is questionable whether she really needs the additional statin at this time and this certainly argues that she should have further workup as to why she had elevated liver enzymes, whether this is due to a new non-alcoholic steatohepatitis or perhaps concomitant medications causing her elevated liver enzymes.  In fact, I reviewed her CT scan today he years ago and she does have steatohepatitis.  Therefore she is not a good candidate for a statin medication. Her blood pressure seems to be well-controlled on her current regimen.  I saw Joanna Brown back in the office today. She is without any new complaints. She occasionally gets some heartburn and is not currently taking medication for that. She denies any chest pain or worsening shortness of breath.  Joanna Brown returns today for follow-up. Again she is without complaints. We discussed her aortic stenosis however she seemed to have little recollection that she has this disorder. I again went over aortic stenosis and the fact that she has mild to moderate narrowing of the aortic valve. This time we are monitoring it clinically. Her last echo was in April of last year. She is asymptomatic. Her  blood pressure is well controlled. She had recent laboratory work which shows an LDL cholesterol of 70 in October which is good control. Her hemoglobin A1c is 7 which is down from 8.3 prior to that. I've encouraged her to continue with this trend is good cholesterol and blood sugar control her helpful in slowing the process of aortic stenosis.  09/12/2016  Joanna Brown was seen today in follow-up. Overall she seems to be doing well. She has no complaints such as shortness of breath or chest pain. EKG is stable showing normal sinus rhythm at 70 with LAFB. Blood pressure is at goal today. She reports her 11 A1c is in the low 7 range. Cholesterol is also been fairly well controlled. She does have a history of moderate aortic stenosis and is due for repeat echo.  09/12/2017  Joanna Brown was seen today in follow-up.  Over the past year she denies any chest pain or worsening shortness of breath.  Unfortunately her hemoglobin A1c is up from the low sevens to 8.2.  Cholesterol also is higher than goal.  Her total cholesterol is 171, HDL 40, LDL 97 and triglycerides 227.  She reports compliance with pravastatin.  She has moderate aortic stenosis with a mean gradient of 20 mmHg based on echo last year and normal LV function.  She did report one brief episode of chest discomfort with more marked exertion a few days ago.  This was right sided underneath the right breast and was a crampy sensation which improved after resting.  I advised that she monitor that further and  if she has more recurrence we may need to consider stress testing.  12/31/2018  Joanna Brown is seen today for routine follow-up.  She was seen in April 2019.  Since then she reports she was doing fairly well up to about 2 weeks ago.  She has had some worsening shortness of breath, particularly at night and while laying down.  She has a mild nonproductive cough.  She also reports some occasional lower extremity edema.  She gets some shortness of breath  with exertion but denies any chest pain, pressure, heaviness or other anginal symptoms.  EKG today shows incomplete right bundle branch pattern.  Her diabetes not well controlled with A1c recently of 8.8.  Her recent cholesterol profile in February showed total cholesterol 145, triglycerides 225, HDL 41 and LDL 73.  She has known aortic stenosis which is at least moderate however not assessed since 2018 by echo.  04/30/2019  Joanna Brown returns today for follow-up.  She had an echo in August 2020 which showed an EF 50 to 55%, moderate LVH, grade 1 diastolic dysfunction.  There was moderate to severe aortic stenosis with a mean gradient of 21 mmHg, AVA by VTI was 1 cm with a dimensionless index of 0.21.  Symptom wise she does report some shortness of breath with exertion but is not consistent.  She has required some pillows to elevate her head at night.  She denies any chest pain at rest.  She has had no presyncopal or syncopal episodes.  10/24/2019  Joanna Brown is seen today in follow-up.  She reports that she has had some chest pain on and off for the past 2 weeks.  She thought initially it might be something that she ate however she has had several episodes with doing house work and other activities that do improve with rest.  She was supposed to have a repeat echo in December and actually is scheduled for repeat echo next week.  On exam today her aortic valve sounds moderately severe.  These could be symptoms associated with the valve.  EKG was personally reviewed shows no new ischemic changes.  11/27/2019  Joanna Brown is seen today in follow-up.  She recently had an echocardiogram to evaluate her aortic stenosis.  As expected the valve is moderately severe.  LVEF is normal with mean gradient 29 mmHg and dimensionless index of 0.26.  Aortic valve area 0.91 cm.  This is more consistent with moderate to severe AS and it appeared visually severely stenotic suggesting possible low-flow low gradient severe aortic  stenosis.  After discussing with her today does not seem like she is having any exertional symptoms, chest pain, heart failure or presyncopal symptoms.  Unfortunately recently she underwent a colonoscopy was found to have a cancerous polyp.  That was removed however a surgical consultation was recommended.  The patient is hesitant with her surgical recommendation and wishes to see another specialist in [place] if possible.  04/16/2020  Joanna Brown returns today for follow-up.  She is due for repeat echo for aortic stenosis but not until January when it scheduled.  She denies any recurrent chest pain or worsening shortness of breath.  She says she is fairly active.  Was felt that she might have a more severe aortic stenosis with a low gradient based on her last echo in June.  Apparently no further work-up was performed regarding her colonoscopy which was abnormal.  She never had surgery because the colon was not appropriately marked.  She is supposed to have  another colonoscopy.  09/24/2020  Joanna Brown is seen today in follow-up as an urgent visit.  Over the past week or so she is noted worsening fatigue and shortness of breath.  She also had an episode that lasted about 30 minutes of left-sided chest discomfort which was felt like a pressure and associated with shortness of breath that eventually resolved.  This occurred at rest.  Over the past several months she has had some decline in her exercise ability.  She used to walk regularly but has backed off on that secondary to this.  Her last echo in January did show moderate to severe aortic stenosis, with a mean gradient of 5 mmHg up from 28 mmHg.  I had recommended a repeat echo in 6 months which would have been July 2022.  PMHx:  Past Medical History:  Diagnosis Date  . Arthritis    OA  . Blood transfusion    1980  . Diabetes mellitus   . Dysrhythmia   . Female bladder prolapse   . GERD 11/18/2008   Qualifier: Diagnosis of  By: Craige Cotta     . GERD (gastroesophageal reflux disease)   . Glaucoma   . Heart disease   . Hyperlipidemia   . Hypertension    echo and stress 4/10 reports on chart, EKG ` LOV 9/12 on chart  . Mild aortic stenosis   . Thyroid disease     Past Surgical History:  Procedure Laterality Date  . ABDOMINAL HYSTERECTOMY    . ANTERIOR AND POSTERIOR REPAIR  04/26/2011   Procedure: ANTERIOR (CYSTOCELE) AND POSTERIOR REPAIR (RECTOCELE);  Surgeon: Reece Packer, MD;  Location: [facility];  Service: Urology;  Laterality: N/A;  . BIOPSY  05/25/2020   Procedure: BIOPSY;  Surgeon: Doran Stabler, MD;  Location: [facility];  Service: Gastroenterology;;  . CATARACT EXTRACTION Bilateral    with IOL  . CHOLECYSTECTOMY  2009  . COLONOSCOPY N/A 12/02/2013   three colon polyps removed, small internal hemorrhoids. Hyperplastic polyps  . COLONOSCOPY N/A 11/20/2019   pancolonic diverticulosis, two 10-11 mm polyps in ascending colon, one 5 mm polyp in cecum, ascending colon with superficially invasive adenocarcinoma arising in background of sessile serrated polyps with low and high grade cytologic dysplasia.  . COLONOSCOPY    . COLONOSCOPY W/ POLYPECTOMY    . COLONOSCOPY WITH PROPOFOL N/A 05/25/2020   diverticulosis in right colon, redundant colon. Caution warranted on future colonoscopy in light of age, cardiac condition, challenging anatomy.   . ESOPHAGOGASTRODUODENOSCOPY (EGD) WITH PROPOFOL N/A 05/25/2020   Grade 1 esophageal varices, single mucosal nodule in stomach s/p biopsy. (hyperplastic)>   . LEFT HEART CATH  09/10/2008   normal coronary arteries, normal LV systolic function, EF 35% (Dr. [name])  . LYMPH NODE DISSECTION Right 1997   under arm  . NM MYOCAR PERF WALL MOTION  2010   dipyridamole - mild-mod in intenstiy perfusion defect in mid anterior, mid anteroseptal wall, EF 70%  . OVARY SURGERY     bilateral tumors removed  . POLYPECTOMY  11/20/2019   Procedure: POLYPECTOMY;  Surgeon: Daneil Dolin, MD;   Location: [facility];  Service: Endoscopy;;  hot and cold snare cecal polyp, and asending polyps x 2  . THYROIDECTOMY    . THYROIDECTOMY  02/2008  . TRANSTHORACIC ECHOCARDIOGRAM  08/2011   EF=>55%, mild conc LVH; trace MR; mild TR; mild-mod AV calcification with mild valvular AV stenosis  . VAGINAL PROLAPSE REPAIR  04/26/2011   Procedure:  VAGINAL VAULT SUSPENSION;  Surgeon: Reece Packer, MD;  Location: [facility];  Service: Urology;  Laterality: N/A;  with Graft  10x6    FAMHx:  Family History  Problem Relation Age of Onset  . Stomach cancer Mother 25  . Heart disease Mother 37       heart disease  . Heart disease Father 58       MI  . Stroke Maternal Grandfather   . Heart attack Paternal Grandfather   . Hypertension Brother   . Bone cancer Brother   . Hypertension Sister   . Liver cancer Sister   . Hypertension Sister   . Hypertension Child   . Colon cancer Neg Hx   . Colon polyps Neg Hx   . Pancreatic cancer Neg Hx   . Esophageal cancer Neg Hx   . Rectal cancer Neg Hx     SOCHx:   reports that she has never smoked. She has never used smokeless tobacco. She reports that she does not drink alcohol and does not use drugs.  ALLERGIES:  Allergies  Allergen Reactions  . Senokot Wheat Bran [Wheat Bran]     ABDOMINAL CRAMPS  . Spironolactone     Stomach problems, vision changes     ROS: Pertinent items noted in HPI and remainder of comprehensive ROS otherwise negative.  HOME MEDS: Current Outpatient Medications  Medication Sig Dispense Refill  . Accu-Chek FastClix Lancets MISC 1 each by Other route 2 (two) times daily. 102 each 2  . ALPHAGAN P 0.1 % SOLN Place 1 drop into both eyes 2 (two) times daily.    Marland Kitchen amLODipine (NORVASC) 10 MG tablet TAKE 1 TABLET(10 MG) BY MOUTH EVERY MORNING 90 tablet 3  . Ascorbic Acid (VITAMIN C) 1000 MG tablet Take 1,000 mg by mouth 4 (four) times a week.    Marland Kitchen aspirin 81 MG tablet Take 81 mg by mouth every other day.     . benazepril  (LOTENSIN) 40 MG tablet TAKE 1 TABLET(40 MG) BY MOUTH DAILY 90 tablet 1  . Blood Glucose Monitoring Suppl (ACCU-CHEK GUIDE) w/Device KIT 1 each by Does not apply route 4 (four) times daily. 1 kit 0  . Calcium Carbonate-Vit D-Min (CALCIUM 1200) 1200-1000 MG-UNIT CHEW Chew 1 each by mouth daily.    . cholecalciferol (VITAMIN D3) 25 MCG (1000 UT) tablet Take 1,000 Units by mouth daily.    . clotrimazole-betamethasone (LOTRISONE) cream Apply twice daily for 1 week, to affected area , then as needed 45 g 0  . ezetimibe (ZETIA) 10 MG tablet TAKE 1 TABLET(10 MG) BY MOUTH DAILY (Patient taking differently: Take 10 mg by mouth daily.) 90 tablet 3  . glipiZIDE (GLUCOTROL XL) 5 MG 24 hr tablet Take 1 tablet (5 mg total) by mouth daily with breakfast. 90 tablet 1  . glucose blood (ACCU-CHEK GUIDE) test strip USE TO CHECK BLOOD SUGAR TWICE DAILY 150 strip 1  . Insulin Pen Needle (B-D ULTRAFINE III SHORT PEN) 31G X 8 MM MISC USE AS DIRECTED TWO TIMES DAILY WITH INSULIN PENS 100 each 5  . latanoprost (XALATAN) 0.005 % ophthalmic solution Place 1 drop into both eyes at bedtime.     . metFORMIN (GLUCOPHAGE) 500 MG tablet TAKE 1 TABLET BY MOUTH EVERY DAY WITH BREAKFAST (Patient taking differently: Take 500 mg by mouth daily with breakfast.) 60 tablet 2  . Multiple Vitamins-Minerals (CENTRUM) tablet Take 1 tablet by mouth every morning.     . Omega-3 Fatty Acids (FISH OIL) 1200  MG CAPS Take 1 capsule by mouth every morning.     . pantoprazole (PROTONIX) 40 MG tablet Take 1 tablet (40 mg total) by mouth daily. 30 minutes before meal 90 tablet 3  . polyethylene glycol (MIRALAX) 17 g packet Take 17 g by mouth at bedtime. 14 each 0  . pravastatin (PRAVACHOL) 80 MG tablet TAKE 1 TABLET(80 MG) BY MOUTH DAILY (Patient taking differently: Take 80 mg by mouth daily.) 90 tablet 1  . triamterene-hydrochlorothiazide (MAXZIDE) 75-50 MG tablet TAKE 1 TABLET BY MOUTH DAILY 90 tablet 1   No current facility-administered medications  for this visit.    LABS/IMAGING: No results found for this or any previous visit (from the past 48 hour(s)). No results found.  VITALS: BP 140/72   Pulse 69   Ht _0  (1.651 m)   Wt 184 lb (83.5 kg)   SpO2 99%   BMI 30.62 kg/m   EXAM: General appearance: alert and no distress Neck: no carotid bruit and no JVD Lungs: clear to auscultation bilaterally Heart: regular rate and rhythm, S1, S2 normal and systolic murmur: late systolic 3/6, crescendo at 2nd left intercostal space Abdomen: soft, non-tender; bowel sounds normal; no masses,  no organomegaly Extremities: extremities normal, atraumatic, no cyanosis or edema Pulses: 2+ and symmetric Skin: Skin color, texture, turgor normal. No rashes or lesions Neurologic: Grossly normal Psych: Mood, affect normal  EKG: Normal sinus rhythm at 69, left axis deviation, voltage criteria for LVH-personally reviewed  ASSESSMENT: 1. Unstable angina versus possible symptomatic aortic stenosis 2. Hypertension-controlled 3. Dyslipidemia 4. Moderate to severe aortic stenosis -mean gradient 29 mmHg, AVA 0.91 cm, DI 0.26 (10/2019) 5. NAFLD 6. GERD 7. DM2 on insulin  PLAN: 1.   Joanna Brown described an episode consistent with unstable angina versus symptomatic aortic stenosis.  On exam today she has no audible second heart sound and I suspect severe aortic stenosis at this point.  Based on these symptoms, I think it is imperative that we further evaluate both her coronaries and the severity of her valve gradient.  I am recommending right and left heart catheterization.  I did discuss the risk, benefits and alternatives of this procedure and she understand those risks and is agreeable to proceed.  We will try to arrange right and left heart catheterization in the near future for evaluation of her chest pain and work-up of her aortic stenosis.  I suspect she may likely be a candidate for TAVR and would also need to exclude or evaluate for any  significant underlying coronary disease.  Plan follow-up with me afterwards.  [name], MD, [facility], [place] Director of the [facility]  [facility] Diplomate of the American Board of Clinical Lipidology Attending Cardiologist  Direct Dial: [phone]  Fax: [phone]  Website:  [email or web address] 09/24/2020, 9:34 AM

## 2020-09-24 NOTE — H&P (View-Only) (Signed)
  OFFICE NOTE  Chief Complaint:  Chest pain, dyspnea  Primary Care Physician: Simpson, Margaret E, MD  HPI:  Joanna Brown is a 80-year-old female who has a history of mild aortic stenosis with a valve area of approximately 1.7 cm, also a history of dyslipidemia and hypertension, both of which have been well controlled. We are seeing her back for an annual visit today. She reports actually feeling very well, started walking every day, has made major changes to her diet as reflected by a marked decrease in triglycerides. Unfortunately recently she had a mild elevation in liver enzymes slightly above normal on lovastatin. Typically we could tolerate liver enzymes up to 3 times normal on statin medications, however, she was taken off the lovastatin. Either way it is questionable whether she really needs the additional statin at this time and this certainly argues that she should have further workup as to why she had elevated liver enzymes, whether this is due to a new non-alcoholic steatohepatitis or perhaps concomitant medications causing her elevated liver enzymes.  In fact, I reviewed her CT scan today he years ago and she does have steatohepatitis.  Therefore she is not a good candidate for a statin medication. Her blood pressure seems to be well-controlled on her current regimen.  I saw Joanna Brown back in the office today. She is without any new complaints. She occasionally gets some heartburn and is not currently taking medication for that. She denies any chest pain or worsening shortness of breath.  Joanna Brown returns today for follow-up. Again she is without complaints. We discussed her aortic stenosis however she seemed to have little recollection that she has this disorder. I again went over aortic stenosis and the fact that she has mild to moderate narrowing of the aortic valve. This time we are monitoring it clinically. Her last echo was in April of last year. She is asymptomatic. Her  blood pressure is well controlled. She had recent laboratory work which shows an LDL cholesterol of 70 in October which is good control. Her hemoglobin A1c is 7 which is down from 8.3 prior to that. I've encouraged her to continue with this trend is good cholesterol and blood sugar control her helpful in slowing the process of aortic stenosis.  09/12/2016  Joanna Brown was seen today in follow-up. Overall she seems to be doing well. She has no complaints such as shortness of breath or chest pain. EKG is stable showing normal sinus rhythm at 70 with LAFB. Blood pressure is at goal today. She reports her 11 A1c is in the low 7 range. Cholesterol is also been fairly well controlled. She does have a history of moderate aortic stenosis and is due for repeat echo.  09/12/2017  Joanna Brown was seen today in follow-up.  Over the past year she denies any chest pain or worsening shortness of breath.  Unfortunately her hemoglobin A1c is up from the low sevens to 8.2.  Cholesterol also is higher than goal.  Her total cholesterol is 171, HDL 40, LDL 97 and triglycerides 227.  She reports compliance with pravastatin.  She has moderate aortic stenosis with a mean gradient of 20 mmHg based on echo last year and normal LV function.  She did report one brief episode of chest discomfort with more marked exertion a few days ago.  This was right sided underneath the right breast and was a crampy sensation which improved after resting.  I advised that she monitor that further and   if she has more recurrence we may need to consider stress testing.  12/31/2018  Joanna Brown is seen today for routine follow-up.  She was seen in April 2019.  Since then she reports she was doing fairly well up to about 2 weeks ago.  She has had some worsening shortness of breath, particularly at night and while laying down.  She has a mild nonproductive cough.  She also reports some occasional lower extremity edema.  She gets some shortness of breath  with exertion but denies any chest pain, pressure, heaviness or other anginal symptoms.  EKG today shows incomplete right bundle branch pattern.  Her diabetes not well controlled with A1c recently of 8.8.  Her recent cholesterol profile in February showed total cholesterol 145, triglycerides 225, HDL 41 and LDL 73.  She has known aortic stenosis which is at least moderate however not assessed since 2018 by echo.  04/30/2019  Joanna Brown returns today for follow-up.  She had an echo in August 2020 which showed an EF 50 to 55%, moderate LVH, grade 1 diastolic dysfunction.  There was moderate to severe aortic stenosis with a mean gradient of 21 mmHg, AVA by VTI was 1 cm with a dimensionless index of 0.21.  Symptom wise she does report some shortness of breath with exertion but is not consistent.  She has required some pillows to elevate her head at night.  She denies any chest pain at rest.  She has had no presyncopal or syncopal episodes.  10/24/2019  Joanna Brown is seen today in follow-up.  She reports that she has had some chest pain on and off for the past 2 weeks.  She thought initially it might be something that she ate however she has had several episodes with doing house work and other activities that do improve with rest.  She was supposed to have a repeat echo in December and actually is scheduled for repeat echo next week.  On exam today her aortic valve sounds moderately severe.  These could be symptoms associated with the valve.  EKG was personally reviewed shows no new ischemic changes.  11/27/2019  Joanna Brown is seen today in follow-up.  She recently had an echocardiogram to evaluate her aortic stenosis.  As expected the valve is moderately severe.  LVEF is normal with mean gradient 29 mmHg and dimensionless index of 0.26.  Aortic valve area 0.91 cm.  This is more consistent with moderate to severe AS and it appeared visually severely stenotic suggesting possible low-flow low gradient severe aortic  stenosis.  After discussing with her today does not seem like she is having any exertional symptoms, chest pain, heart failure or presyncopal symptoms.  Unfortunately recently she underwent a colonoscopy was found to have a cancerous polyp.  That was removed however a surgical consultation was recommended.  The patient is hesitant with her surgical recommendation and wishes to see another specialist in  if possible.  04/16/2020  Joanna Brown returns today for follow-up.  She is due for repeat echo for aortic stenosis but not until January when it scheduled.  She denies any recurrent chest pain or worsening shortness of breath.  She says she is fairly active.  Was felt that she might have a more severe aortic stenosis with a low gradient based on her last echo in June.  Apparently no further work-up was performed regarding her colonoscopy which was abnormal.  She never had surgery because the colon was not appropriately marked.  She is supposed to have   another colonoscopy.  09/24/2020  Joanna Brown is seen today in follow-up as an urgent visit.  Over the past week or so she is noted worsening fatigue and shortness of breath.  She also had an episode that lasted about 30 minutes of left-sided chest discomfort which was felt like a pressure and associated with shortness of breath that eventually resolved.  This occurred at rest.  Over the past several months she has had some decline in her exercise ability.  She used to walk regularly but has backed off on that secondary to this.  Her last echo in January did show moderate to severe aortic stenosis, with a mean gradient of 5 mmHg up from 28 mmHg.  I had recommended a repeat echo in 6 months which would have been July 2022.  PMHx:  Past Medical History:  Diagnosis Date  . Arthritis    OA  . Blood transfusion    1980  . Diabetes mellitus   . Dysrhythmia   . Female bladder prolapse   . GERD 11/18/2008   Qualifier: Diagnosis of  By: Moore, Amanda     . GERD (gastroesophageal reflux disease)   . Glaucoma   . Heart disease   . Hyperlipidemia   . Hypertension    echo and stress 4/10 reports on chart, EKG ` LOV 9/12 on chart  . Mild aortic stenosis   . Thyroid disease     Past Surgical History:  Procedure Laterality Date  . ABDOMINAL HYSTERECTOMY    . ANTERIOR AND POSTERIOR REPAIR  04/26/2011   Procedure: ANTERIOR (CYSTOCELE) AND POSTERIOR REPAIR (RECTOCELE);  Surgeon: Scott A MacDiarmid, MD;  Location: [facility];  Service: Urology;  Laterality: N/A;  . BIOPSY  05/25/2020   Procedure: BIOPSY;  Surgeon: Danis, Henry L III, MD;  Location: [facility];  Service: Gastroenterology;;  . CATARACT EXTRACTION Bilateral    with IOL  . CHOLECYSTECTOMY  2009  . COLONOSCOPY N/A 12/02/2013   three colon polyps removed, small internal hemorrhoids. Hyperplastic polyps  . COLONOSCOPY N/A 11/20/2019   pancolonic diverticulosis, two 10-11 mm polyps in ascending colon, one 5 mm polyp in cecum, ascending colon with superficially invasive adenocarcinoma arising in background of sessile serrated polyps with low and high grade cytologic dysplasia.  . COLONOSCOPY    . COLONOSCOPY W/ POLYPECTOMY    . COLONOSCOPY WITH PROPOFOL N/A 05/25/2020   diverticulosis in right colon, redundant colon. Caution warranted on future colonoscopy in light of age, cardiac condition, challenging anatomy.   . ESOPHAGOGASTRODUODENOSCOPY (EGD) WITH PROPOFOL N/A 05/25/2020   Grade 1 esophageal varices, single mucosal nodule in stomach s/p biopsy. (hyperplastic)>   . LEFT HEART CATH  09/10/2008   normal coronary arteries, normal LV systolic function, EF 65% (Dr. H. Solomon)  . LYMPH NODE DISSECTION Right 1997   under arm  . NM MYOCAR PERF WALL MOTION  2010   dipyridamole - mild-mod in intenstiy perfusion defect in mid anterior, mid anteroseptal wall, EF 70%  . OVARY SURGERY     bilateral tumors removed  . POLYPECTOMY  11/20/2019   Procedure: POLYPECTOMY;  Surgeon: Rourk, Robert M, MD;   Location: [facility];  Service: Endoscopy;;  hot and cold snare cecal polyp, and asending polyps x 2  . THYROIDECTOMY    . THYROIDECTOMY  02/2008  . TRANSTHORACIC ECHOCARDIOGRAM  08/2011   EF=>55%, mild conc LVH; trace MR; mild TR; mild-mod AV calcification with mild valvular AV stenosis  . VAGINAL PROLAPSE REPAIR  04/26/2011   Procedure:   VAGINAL VAULT SUSPENSION;  Surgeon: Scott A MacDiarmid, MD;  Location: [facility];  Service: Urology;  Laterality: N/A;  with Graft  10x6    FAMHx:  Family History  Problem Relation Age of Onset  . Stomach cancer Mother 52  . Heart disease Mother 80       heart disease  . Heart disease Father 50       MI  . Stroke Maternal Grandfather   . Heart attack Paternal Grandfather   . Hypertension Brother   . Bone cancer Brother   . Hypertension Sister   . Liver cancer Sister   . Hypertension Sister   . Hypertension Child   . Colon cancer Neg Hx   . Colon polyps Neg Hx   . Pancreatic cancer Neg Hx   . Esophageal cancer Neg Hx   . Rectal cancer Neg Hx     SOCHx:   reports that she has never smoked. She has never used smokeless tobacco. She reports that she does not drink alcohol and does not use drugs.  ALLERGIES:  Allergies  Allergen Reactions  . Senokot Wheat Bran [Wheat Bran]     ABDOMINAL CRAMPS  . Spironolactone     Stomach problems, vision changes     ROS: Pertinent items noted in HPI and remainder of comprehensive ROS otherwise negative.  HOME MEDS: Current Outpatient Medications  Medication Sig Dispense Refill  . Accu-Chek FastClix Lancets MISC 1 each by Other route 2 (two) times daily. 102 each 2  . ALPHAGAN P 0.1 % SOLN Place 1 drop into both eyes 2 (two) times daily.    . amLODipine (NORVASC) 10 MG tablet TAKE 1 TABLET(10 MG) BY MOUTH EVERY MORNING 90 tablet 3  . Ascorbic Acid (VITAMIN C) 1000 MG tablet Take 1,000 mg by mouth 4 (four) times a week.    . aspirin 81 MG tablet Take 81 mg by mouth every other day.     . benazepril  (LOTENSIN) 40 MG tablet TAKE 1 TABLET(40 MG) BY MOUTH DAILY 90 tablet 1  . Blood Glucose Monitoring Suppl (ACCU-CHEK GUIDE) w/Device KIT 1 each by Does not apply route 4 (four) times daily. 1 kit 0  . Calcium Carbonate-Vit D-Min (CALCIUM 1200) 1200-1000 MG-UNIT CHEW Chew 1 each by mouth daily.    . cholecalciferol (VITAMIN D3) 25 MCG (1000 UT) tablet Take 1,000 Units by mouth daily.    . clotrimazole-betamethasone (LOTRISONE) cream Apply twice daily for 1 week, to affected area , then as needed 45 g 0  . ezetimibe (ZETIA) 10 MG tablet TAKE 1 TABLET(10 MG) BY MOUTH DAILY (Patient taking differently: Take 10 mg by mouth daily.) 90 tablet 3  . glipiZIDE (GLUCOTROL XL) 5 MG 24 hr tablet Take 1 tablet (5 mg total) by mouth daily with breakfast. 90 tablet 1  . glucose blood (ACCU-CHEK GUIDE) test strip USE TO CHECK BLOOD SUGAR TWICE DAILY 150 strip 1  . Insulin Pen Needle (B-D ULTRAFINE III SHORT PEN) 31G X 8 MM MISC USE AS DIRECTED TWO TIMES DAILY WITH INSULIN PENS 100 each 5  . latanoprost (XALATAN) 0.005 % ophthalmic solution Place 1 drop into both eyes at bedtime.     . metFORMIN (GLUCOPHAGE) 500 MG tablet TAKE 1 TABLET BY MOUTH EVERY DAY WITH BREAKFAST (Patient taking differently: Take 500 mg by mouth daily with breakfast.) 60 tablet 2  . Multiple Vitamins-Minerals (CENTRUM) tablet Take 1 tablet by mouth every morning.     . Omega-3 Fatty Acids (FISH OIL) 1200   MG CAPS Take 1 capsule by mouth every morning.     . pantoprazole (PROTONIX) 40 MG tablet Take 1 tablet (40 mg total) by mouth daily. 30 minutes before meal 90 tablet 3  . polyethylene glycol (MIRALAX) 17 g packet Take 17 g by mouth at bedtime. 14 each 0  . pravastatin (PRAVACHOL) 80 MG tablet TAKE 1 TABLET(80 MG) BY MOUTH DAILY (Patient taking differently: Take 80 mg by mouth daily.) 90 tablet 1  . triamterene-hydrochlorothiazide (MAXZIDE) 75-50 MG tablet TAKE 1 TABLET BY MOUTH DAILY 90 tablet 1   No current facility-administered medications  for this visit.    LABS/IMAGING: No results found for this or any previous visit (from the past 48 hour(s)). No results found.  VITALS: BP 140/72   Pulse 69   Ht 5' 5" (1.651 m)   Wt 184 lb (83.5 kg)   SpO2 99%   BMI 30.62 kg/m   EXAM: General appearance: alert and no distress Neck: no carotid bruit and no JVD Lungs: clear to auscultation bilaterally Heart: regular rate and rhythm, S1, S2 normal and systolic murmur: late systolic 3/6, crescendo at 2nd left intercostal space Abdomen: soft, non-tender; bowel sounds normal; no masses,  no organomegaly Extremities: extremities normal, atraumatic, no cyanosis or edema Pulses: 2+ and symmetric Skin: Skin color, texture, turgor normal. No rashes or lesions Neurologic: Grossly normal Psych: Mood, affect normal  EKG: Normal sinus rhythm at 69, left axis deviation, voltage criteria for LVH-personally reviewed  ASSESSMENT: 1. Unstable angina versus possible symptomatic aortic stenosis 2. Hypertension-controlled 3. Dyslipidemia 4. Moderate to severe aortic stenosis -mean gradient 29 mmHg, AVA 0.91 cm, DI 0.26 (10/2019) 5. NAFLD 6. GERD 7. DM2 on insulin  PLAN: 1.   Joanna Brown described an episode consistent with unstable angina versus symptomatic aortic stenosis.  On exam today she has no audible second heart sound and I suspect severe aortic stenosis at this point.  Based on these symptoms, I think it is imperative that we further evaluate both her coronaries and the severity of her valve gradient.  I am recommending right and left heart catheterization.  I did discuss the risk, benefits and alternatives of this procedure and she understand those risks and is agreeable to proceed.  We will try to arrange right and left heart catheterization in the near future for evaluation of her chest pain and work-up of her aortic stenosis.  I suspect she may likely be a candidate for TAVR and would also need to exclude or evaluate for any  significant underlying coronary disease.  Plan follow-up with me afterwards.  Lakenya Riendeau C. Drake [place]ing, MD, FACC, FACP  [place]  [facility]  Medical Director of the [facility] Diplomate of the American Board of Clinical Lipidology Attending Cardiologist  Direct Dial: [phone]  Fax: [phone]  Website:  [email or web address]  Faylene Allerton C Chynna Buerkle 09/24/2020, 9:34 AM 

## 2020-09-25 LAB — BASIC METABOLIC PANEL
BUN/Creatinine Ratio: 15 (ref 12–28)
BUN: 16 mg/dL (ref 8–27)
CO2: 26 mmol/L (ref 20–29)
Calcium: 10.8 mg/dL — ABNORMAL HIGH (ref 8.7–10.3)
Chloride: 89 mmol/L — ABNORMAL LOW (ref 96–106)
Creatinine, Ser: 1.09 mg/dL — ABNORMAL HIGH (ref 0.57–1.00)
Glucose: 230 mg/dL — ABNORMAL HIGH (ref 65–99)
Potassium: 4.2 mmol/L (ref 3.5–5.2)
Sodium: 131 mmol/L — ABNORMAL LOW (ref 134–144)
eGFR: 52 mL/min/{1.73_m2} — ABNORMAL LOW (ref >59)

## 2020-09-25 LAB — CBC
Hematocrit: 40.5 % (ref 34.0–46.6)
Hemoglobin: 13.4 g/dL (ref 11.1–15.9)
MCH: 28.3 pg (ref 26.6–33.0)
MCHC: 33.1 g/dL (ref 31.5–35.7)
MCV: 85 fL (ref 79–97)
Platelets: 205 10*3/uL (ref 150–450)
RBC: 4.74 x10E6/uL (ref 3.77–5.28)
RDW: 13.2 % (ref 11.7–15.4)
WBC: 6.5 10*3/uL (ref 3.4–10.8)

## 2020-09-28 ENCOUNTER — Other Ambulatory Visit: Payer: Self-pay | Admitting: Internal Medicine

## 2020-09-28 ENCOUNTER — Other Ambulatory Visit: Payer: Self-pay | Admitting: *Deleted

## 2020-09-28 DIAGNOSIS — Z01812 Encounter for preprocedural laboratory examination: Secondary | ICD-10-CM

## 2020-09-28 DIAGNOSIS — I200 Unstable angina: Secondary | ICD-10-CM

## 2020-09-28 DIAGNOSIS — I350 Nonrheumatic aortic (valve) stenosis: Secondary | ICD-10-CM

## 2020-09-28 MED ORDER — TRIAMTERENE-HCTZ 75-50 MG PO TABS: 0.5000 | ORAL_TABLET | Freq: Every day | ORAL | 0 refills | Status: DC

## 2020-09-29 ENCOUNTER — Other Ambulatory Visit (HOSPITAL_COMMUNITY)
Admission: RE | Admit: 2020-09-29 | Discharge: 2020-09-29 | Disposition: A | Discharge status: Home / Self Care | Payer: Medicare Other | Source: Ambulatory Visit | Attending: Cardiovascular Disease | Admitting: Cardiovascular Disease

## 2020-09-29 DIAGNOSIS — Z20822 Contact with and (suspected) exposure to covid-19: Secondary | ICD-10-CM | POA: Insufficient documentation

## 2020-09-29 DIAGNOSIS — Z01812 Encounter for preprocedural laboratory examination: Secondary | ICD-10-CM | POA: Diagnosis not present

## 2020-09-29 LAB — SARS CORONAVIRUS 2 (TAT 6-24 HRS): SARS Coronavirus 2: NEGATIVE

## 2020-10-01 ENCOUNTER — Telehealth: Payer: Self-pay | Admitting: *Deleted

## 2020-10-01 NOTE — Telephone Encounter (Signed)
Pt contacted pre-catheterization scheduled at [facility] for: Friday Oct 02, 2020 10:30 AM Verified arrival time and place: [place]) at: 8:30 AM   No solid food after midnight prior to cath, clear liquids until 5 AM day of procedure.  Hold: Metformin-day of procedure and 48 hours post procedure Glipizide-AM of procedure Insulin-AM of procedure/1/2 usual Insulin HS prior to procedure Triamterene/HCT-AM of procedure  Except hold medications AM meds can be  taken pre-cath with sips of water including: ASA 81 mg   Confirmed patient has responsible adult to drive home post procedure and be with patient first 24 hours after arriving home: yes  You are allowed ONE visitor in the waiting room during the time you are at the hospital for your procedure. Both you and your visitor must wear a mask once you enter the hospital.   Reviewed procedure/mask/visitor instructions with patient.

## 2020-10-02 ENCOUNTER — Other Ambulatory Visit: Payer: Self-pay

## 2020-10-02 ENCOUNTER — Ambulatory Visit (HOSPITAL_COMMUNITY)
Admission: RE | Disposition: A | Discharge status: Home / Self Care | Payer: Self-pay | Source: Home / Self Care | Attending: Cardiovascular Disease

## 2020-10-02 ENCOUNTER — Ambulatory Visit (HOSPITAL_COMMUNITY)
Admission: RE | Admit: 2020-10-02 | Discharge: 2020-10-02 | Disposition: A | Discharge status: Home / Self Care | Payer: Medicare Other | Attending: Cardiovascular Disease | Admitting: Cardiovascular Disease

## 2020-10-02 DIAGNOSIS — Z888 Allergy status to other drugs, medicaments and biological substances status: Secondary | ICD-10-CM | POA: Insufficient documentation

## 2020-10-02 DIAGNOSIS — Z79899 Other long term (current) drug therapy: Secondary | ICD-10-CM | POA: Diagnosis not present

## 2020-10-02 DIAGNOSIS — E890 Postprocedural hypothyroidism: Secondary | ICD-10-CM | POA: Diagnosis not present

## 2020-10-02 DIAGNOSIS — E785 Hyperlipidemia, unspecified: Secondary | ICD-10-CM | POA: Insufficient documentation

## 2020-10-02 DIAGNOSIS — Z8249 Family history of ischemic heart disease and other diseases of the circulatory system: Secondary | ICD-10-CM | POA: Diagnosis not present

## 2020-10-02 DIAGNOSIS — I350 Nonrheumatic aortic (valve) stenosis: Secondary | ICD-10-CM | POA: Insufficient documentation

## 2020-10-02 DIAGNOSIS — Z86010 Personal history of colonic polyps: Secondary | ICD-10-CM | POA: Diagnosis not present

## 2020-10-02 DIAGNOSIS — I2510 Atherosclerotic heart disease of native coronary artery without angina pectoris: Secondary | ICD-10-CM | POA: Insufficient documentation

## 2020-10-02 DIAGNOSIS — I2584 Coronary atherosclerosis due to calcified coronary lesion: Secondary | ICD-10-CM | POA: Insufficient documentation

## 2020-10-02 DIAGNOSIS — Z9049 Acquired absence of other specified parts of digestive tract: Secondary | ICD-10-CM | POA: Insufficient documentation

## 2020-10-02 DIAGNOSIS — Z8719 Personal history of other diseases of the digestive system: Secondary | ICD-10-CM | POA: Insufficient documentation

## 2020-10-02 DIAGNOSIS — I200 Unstable angina: Secondary | ICD-10-CM

## 2020-10-02 DIAGNOSIS — Z7984 Long term (current) use of oral hypoglycemic drugs: Secondary | ICD-10-CM | POA: Diagnosis not present

## 2020-10-02 DIAGNOSIS — Z7982 Long term (current) use of aspirin: Secondary | ICD-10-CM | POA: Insufficient documentation

## 2020-10-02 DIAGNOSIS — Z90710 Acquired absence of both cervix and uterus: Secondary | ICD-10-CM | POA: Insufficient documentation

## 2020-10-02 DIAGNOSIS — E119 Type 2 diabetes mellitus without complications: Secondary | ICD-10-CM | POA: Diagnosis not present

## 2020-10-02 DIAGNOSIS — Z01812 Encounter for preprocedural laboratory examination: Secondary | ICD-10-CM

## 2020-10-02 DIAGNOSIS — Z794 Long term (current) use of insulin: Secondary | ICD-10-CM | POA: Insufficient documentation

## 2020-10-02 DIAGNOSIS — I119 Hypertensive heart disease without heart failure: Secondary | ICD-10-CM | POA: Diagnosis not present

## 2020-10-02 HISTORY — PX: RIGHT/LEFT HEART CATH AND CORONARY ANGIOGRAPHY: CATH118266

## 2020-10-02 LAB — POCT I-STAT 7, (LYTES, BLD GAS, ICA,H+H)
Acid-Base Excess: 1 mmol/L (ref 0.0–2.0)
Acid-Base Excess: 2 mmol/L (ref 0.0–2.0)
Bicarbonate: 26.1 mmol/L (ref 20.0–28.0)
Bicarbonate: 26.8 mmol/L (ref 20.0–28.0)
Calcium, Ion: 1.22 mmol/L (ref 1.15–1.40)
Calcium, Ion: 1.15 mmol/L (ref 1.15–1.40)
HCT: 34 % — ABNORMAL LOW (ref 36.0–46.0)
HCT: 36 % (ref 36.0–46.0)
Hemoglobin: 11.6 g/dL — ABNORMAL LOW (ref 12.0–15.0)
Hemoglobin: 12.2 g/dL (ref 12.0–15.0)
O2 Saturation: 98 %
O2 Saturation: 98 %
Potassium: 3.2 mmol/L — ABNORMAL LOW (ref 3.5–5.1)
Potassium: 3.2 mmol/L — ABNORMAL LOW (ref 3.5–5.1)
Sodium: 139 mmol/L (ref 135–145)
Sodium: 140 mmol/L (ref 135–145)
TCO2: 27 mmol/L (ref 22–32)
TCO2: 28 mmol/L (ref 22–32)
pCO2 arterial: 42.9 mmHg (ref 32.0–48.0)
pCO2 arterial: 43.7 mmHg (ref 32.0–48.0)
pH, Arterial: 7.393 (ref 7.350–7.450)
pH, Arterial: 7.395 (ref 7.350–7.450)
pO2, Arterial: 110 mmHg — ABNORMAL HIGH (ref 83.0–108.0)
pO2, Arterial: 114 mmHg — ABNORMAL HIGH (ref 83.0–108.0)

## 2020-10-02 LAB — GLUCOSE, CAPILLARY
Glucose-Capillary: 70 mg/dL (ref 70–99)
Glucose-Capillary: 143 mg/dL — ABNORMAL HIGH (ref 70–99)
Glucose-Capillary: 71 mg/dL (ref 70–99)

## 2020-10-02 LAB — POCT I-STAT EG7
Acid-Base Excess: 3 mmol/L — ABNORMAL HIGH (ref 0.0–2.0)
Bicarbonate: 28.3 mmol/L — ABNORMAL HIGH (ref 20.0–28.0)
Calcium, Ion: 1.22 mmol/L (ref 1.15–1.40)
HCT: 35 % — ABNORMAL LOW (ref 36.0–46.0)
Hemoglobin: 11.9 g/dL — ABNORMAL LOW (ref 12.0–15.0)
O2 Saturation: 76 %
Potassium: 3.3 mmol/L — ABNORMAL LOW (ref 3.5–5.1)
Sodium: 139 mmol/L (ref 135–145)
TCO2: 30 mmol/L (ref 22–32)
pCO2, Ven: 47.4 mmHg (ref 44.0–60.0)
pH, Ven: 7.383 (ref 7.250–7.430)
pO2, Ven: 42 mmHg (ref 32.0–45.0)

## 2020-10-02 SURGERY — RIGHT/LEFT HEART CATH AND CORONARY ANGIOGRAPHY
Anesthesia: LOCAL

## 2020-10-02 MED ORDER — FENTANYL CITRATE (PF) 100 MCG/2ML IJ SOLN
INTRAMUSCULAR | Status: DC | PRN
  Administered 2020-10-02: 25 ug via INTRAVENOUS

## 2020-10-02 MED ORDER — HEPARIN (PORCINE) IN NACL 1000-0.9 UT/500ML-% IV SOLN
INTRAVENOUS | Status: DC | PRN
  Administered 2020-10-02 (×2): 500 mL

## 2020-10-02 MED ORDER — ACETAMINOPHEN 325 MG PO TABS: 650.0000 mg | ORAL_TABLET | ORAL | Status: DC | PRN

## 2020-10-02 MED ORDER — DEXTROSE 50 % IV SOLN
INTRAVENOUS | Status: AC
  Filled 2020-10-02: qty 50

## 2020-10-02 MED ORDER — HYDRALAZINE HCL 20 MG/ML IJ SOLN: 10.0000 mg | INTRAMUSCULAR | Status: DC | PRN

## 2020-10-02 MED ORDER — LIDOCAINE HCL (PF) 1 % IJ SOLN
INTRAMUSCULAR | Status: AC
  Filled 2020-10-02: qty 30

## 2020-10-02 MED ORDER — HEPARIN (PORCINE) IN NACL 1000-0.9 UT/500ML-% IV SOLN
INTRAVENOUS | Status: AC
  Filled 2020-10-02: qty 500

## 2020-10-02 MED ORDER — SODIUM CHLORIDE 0.9% FLUSH: 3.0000 mL | INTRAVENOUS | Status: DC | PRN

## 2020-10-02 MED ORDER — ASPIRIN 81 MG PO CHEW
81.0000 mg | CHEWABLE_TABLET | ORAL | Status: DC
  Filled 2020-10-02: qty 1

## 2020-10-02 MED ORDER — SODIUM CHLORIDE 0.9 % WEIGHT BASED INFUSION
1.0000 mL/kg/h | INTRAVENOUS | Status: DC
  Administered 2020-10-02: 1 mL/kg/h via INTRAVENOUS

## 2020-10-02 MED ORDER — DEXTROSE 50 % IV SOLN
25.0000 mL | Freq: Once | INTRAVENOUS | Status: AC
  Administered 2020-10-02: 25 mL via INTRAVENOUS

## 2020-10-02 MED ORDER — FENTANYL CITRATE (PF) 100 MCG/2ML IJ SOLN
INTRAMUSCULAR | Status: AC
  Filled 2020-10-02: qty 2

## 2020-10-02 MED ORDER — SODIUM CHLORIDE 0.9 % WEIGHT BASED INFUSION: 1.0000 mL/kg/h | INTRAVENOUS | Status: DC

## 2020-10-02 MED ORDER — LIDOCAINE HCL (PF) 1 % IJ SOLN
INTRAMUSCULAR | Status: DC | PRN
  Administered 2020-10-02: 20 mL

## 2020-10-02 MED ORDER — SODIUM CHLORIDE 0.9 % IV SOLN: 250.0000 mL | INTRAVENOUS | Status: DC | PRN

## 2020-10-02 MED ORDER — MIDAZOLAM HCL 2 MG/2ML IJ SOLN
INTRAMUSCULAR | Status: DC | PRN
Start: 2020-10-02 — End: 2020-10-02
  Administered 2020-10-02: 2 mg via INTRAVENOUS

## 2020-10-02 MED ORDER — SODIUM CHLORIDE 0.9% FLUSH: 3.0000 mL | Freq: Two times a day (BID) | INTRAVENOUS | Status: DC

## 2020-10-02 MED ORDER — HEPARIN SODIUM (PORCINE) 1000 UNIT/ML IJ SOLN
INTRAMUSCULAR | Status: AC
  Filled 2020-10-02: qty 1

## 2020-10-02 MED ORDER — ONDANSETRON HCL 4 MG/2ML IJ SOLN: 4.0000 mg | Freq: Four times a day (QID) | INTRAMUSCULAR | Status: DC | PRN

## 2020-10-02 MED ORDER — SODIUM CHLORIDE 0.9 % WEIGHT BASED INFUSION
3.0000 mL/kg/h | INTRAVENOUS | Status: AC
  Administered 2020-10-02: 3 mL/kg/h via INTRAVENOUS

## 2020-10-02 MED ORDER — MIDAZOLAM HCL 2 MG/2ML IJ SOLN
INTRAMUSCULAR | Status: AC
  Filled 2020-10-02: qty 2

## 2020-10-02 MED ORDER — IOHEXOL 350 MG/ML SOLN
INTRAVENOUS | Status: DC | PRN
  Administered 2020-10-02: 45 mL

## 2020-10-02 MED ORDER — VERAPAMIL HCL 2.5 MG/ML IV SOLN
INTRAVENOUS | Status: AC
  Filled 2020-10-02: qty 2

## 2020-10-02 SURGICAL SUPPLY — 13 items
CATH INFINITI 5FR AL1 (CATHETERS) ×2 IMPLANT
CATH INFINITI 5FR MULTPACK ANG (CATHETERS) ×2 IMPLANT
CATH SWAN GANZ 7F STRAIGHT (CATHETERS) ×2 IMPLANT
CLOSURE MYNX CONTROL 5F (Vascular Products) ×2 IMPLANT
KIT HEART LEFT (KITS) ×2 IMPLANT
PACK CARDIAC CATHETERIZATION (CUSTOM PROCEDURE TRAY) ×2 IMPLANT
SHEATH PINNACLE 5F 10CM (SHEATH) ×2 IMPLANT
SHEATH PINNACLE 7F 10CM (SHEATH) ×2 IMPLANT
SHEATH PROBE COVER 6X72 (BAG) ×4 IMPLANT
TRANSDUCER W/STOPCOCK (MISCELLANEOUS) ×2 IMPLANT
TUBING CIL FLEX 10 FLL-RA (TUBING) ×2 IMPLANT
WIRE EMERALD 3MM-J .035X150CM (WIRE) ×2 IMPLANT
WIRE EMERALD ST .035X150CM (WIRE) ×2 IMPLANT

## 2020-10-02 NOTE — Interval H&P Note (Signed)
History and Physical Interval Note:  10/02/2020 9:51 AM  Joanna Brown  has presented today for surgery, with the diagnosis of chest pain, aortic stenosis.  The various methods of treatment have been discussed with the patient and family. After consideration of risks, benefits and other options for treatment, the patient has consented to  Procedure(s): RIGHT/LEFT HEART CATH AND CORONARY ANGIOGRAPHY (N/A) as a surgical intervention.  The patient's history has been reviewed, patient examined, no change in status, stable for surgery.  I have reviewed the patient's chart and labs.  Questions were answered to the patient's satisfaction.   Cath Lab Visit (complete for each Cath Lab visit)  Clinical Evaluation Leading to the Procedure:   ACS: No.  Non-ACS:    Anginal Classification: CCS II  Anti-ischemic medical therapy: Maximal Therapy (2 or more classes of medications)  Non-Invasive Test Results: No non-invasive testing performed  Prior CABG: No previous CABG        Collier [facility], [place] 10/02/2020 9:51 AM

## 2020-10-02 NOTE — Discharge Instructions (Signed)
Femoral Site Care  This sheet gives you information about how to care for yourself after your procedure. Your health care provider may also give you more specific instructions. If you have problems or questions, contact your health care provider. What can I expect after the procedure? After the procedure, it is common to have:  Bruising that usually fades within 1-2 weeks.  Tenderness at the site. Follow these instructions at home: Wound care  Follow instructions from your health care provider about how to take care of your insertion site. Make sure you: ? Wash your hands with soap and water before you change your bandage (dressing). If soap and water are not available, use hand sanitizer. ? Change your dressing as told by your health care provider. ? Leave stitches (sutures), skin glue, or adhesive strips in place. These skin closures may need to stay in place for 2 weeks or longer. If adhesive strip edges start to loosen and curl up, you may trim the loose edges. Do not remove adhesive strips completely unless your health care provider tells you to do that.  Do not take baths, swim, or use a hot tub until your health care provider approves.  You may shower 24-48 hours after the procedure or as told by your health care provider. ? Gently wash the site with plain soap and water. ? Pat the area dry with a clean towel. ? Do not rub the site. This may cause bleeding.  Do not apply powder or lotion to the site. Keep the site clean and dry.  Check your femoral site every day for signs of infection. Check for: ? Redness, swelling, or pain. ? Fluid or blood. ? Warmth. ? Pus or a bad smell. Activity  For the first 2-3 days after your procedure, or as long as directed: ? Avoid climbing stairs as much as possible. ? Do not squat.  Do not lift anything that is heavier than 10 lb (4.5 kg), or the limit that you are told, until your health care provider says that it is safe.  Rest as  directed. ? Avoid sitting for a long time without moving. Get up to take short walks every 1-2 hours.  Do not drive for 24 hours if you were given a medicine to help you relax (sedative). General instructions  Take over-the-counter and prescription medicines only as told by your health care provider.  Keep all follow-up visits as told by your health care provider. This is important. Contact a health care provider if you have:  A fever or chills.  You have redness, swelling, or pain around your insertion site. Get help right away if:  The catheter insertion area swells very fast.  You pass out.  You suddenly start to sweat or your skin gets clammy.  The catheter insertion area is bleeding, and the bleeding does not stop when you hold steady pressure on the area.  The area near or just beyond the catheter insertion site becomes pale, cool, tingly, or numb. These symptoms may represent a serious problem that is an emergency. Do not wait to see if the symptoms will go away. Get medical help right away. Call your local emergency services (911 in the [place] Do not drive yourself to the hospital. Summary  After the procedure, it is common to have bruising that usually fades within 1-2 weeks.  Check your femoral site every day for signs of infection.  Do not lift anything that is heavier than 10 lb (4.5 kg), or   the limit that you are told, until your health care provider says that it is safe. This information is not intended to replace advice given to you by your health care provider. Make sure you discuss any questions you have with your health care provider. Document Revised: 01/03/2020 Document Reviewed: 01/03/2020 Elsevier Patient Education  2021 [employer]  

## 2020-10-05 ENCOUNTER — Encounter (HOSPITAL_COMMUNITY): Payer: Self-pay | Admitting: Cardiology

## 2020-10-05 ENCOUNTER — Telehealth: Payer: Self-pay | Admitting: "Endocrinology

## 2020-10-05 MED ORDER — LEVEMIR FLEXTOUCH 100 UNIT/ML ~~LOC~~ SOPN: 60.0000 [IU] | PEN_INJECTOR | Freq: Two times a day (BID) | SUBCUTANEOUS | 2 refills | Status: DC

## 2020-10-05 MED FILL — Verapamil HCl IV Soln 2.5 MG/ML: INTRAVENOUS | Qty: 2 | Status: AC

## 2020-10-05 NOTE — Telephone Encounter (Signed)
Pt is calling and states she did not receive her full supply of insulin detemir (LEVEMIR FLEXTOUCH) 100 UNIT/ML FlexPen  And is needing a refill. She states Dr Dorris Fetch was in the middle of changing her rx but has not received anything. Please advise as she states she is out.   [id number] - [place], [place] [place] Phone:  [phone]  Fax:  [phone]

## 2020-10-05 NOTE — Telephone Encounter (Signed)
Rx sent 

## 2020-10-23 ENCOUNTER — Other Ambulatory Visit: Payer: Self-pay

## 2020-10-23 ENCOUNTER — Ambulatory Visit (INDEPENDENT_AMBULATORY_CARE_PROVIDER_SITE_OTHER): Payer: Medicare Other | Admitting: Physician Assistant

## 2020-10-23 ENCOUNTER — Encounter: Payer: Self-pay | Admitting: Physician Assistant

## 2020-10-23 VITALS — BP 144/70 | HR 65 | Ht 65.0 in | Wt 182.6 lb

## 2020-10-23 DIAGNOSIS — I10 Essential (primary) hypertension: Secondary | ICD-10-CM

## 2020-10-23 DIAGNOSIS — E785 Hyperlipidemia, unspecified: Secondary | ICD-10-CM

## 2020-10-23 DIAGNOSIS — I350 Nonrheumatic aortic (valve) stenosis: Secondary | ICD-10-CM

## 2020-10-23 DIAGNOSIS — I2510 Atherosclerotic heart disease of native coronary artery without angina pectoris: Secondary | ICD-10-CM | POA: Diagnosis not present

## 2020-10-23 DIAGNOSIS — I200 Unstable angina: Secondary | ICD-10-CM | POA: Diagnosis not present

## 2020-10-23 MED ORDER — EZETIMIBE 10 MG PO TABS: 10.0000 mg | ORAL_TABLET | Freq: Every day | ORAL | 3 refills | Status: DC

## 2020-10-23 MED ORDER — PRAVASTATIN SODIUM 80 MG PO TABS: 80.0000 mg | ORAL_TABLET | Freq: Every morning | ORAL | 3 refills | Status: DC

## 2020-10-23 NOTE — Progress Notes (Signed)
Cardiology Office Note:    Date:  10/23/2020   ID:  Joanna Brown, DOB Jun 03, 1940, MRN [id number]  PCP:  Fayrene Helper, MD   [facility] Cardiologist:  [name], MD {  Referring MD: Fayrene Helper, MD   Chief Complaint  Patient presents with   Follow-up    Seen for Dr. [name]    History of Present Illness:    Joanna Brown is a 80 y.o. female with a hx of aortic stenosis, hypertension, hyperlipidemia and hypothyroidism.  She had a history of elevated liver enzyme on lovastatin.  Previously she has been followed by Dr. [name] for lipid control.  Echocardiogram in August 2020 showed EF 50 to 55%, moderate LVH, grade 1 DD, moderate to severe aortic stenosis.  Echocardiogram obtained on 06/04/2020 showed EF 65 to 70%, moderate to severe aortic stenosis.  She saw Dr. [name] on 09/25/2018 at which time she was complaining of worsening fatigue, shortness of breath and intermittent chest pain.  Ultimately underwent a left and right heart cath on 10/02/2020 that showed mild CAD with 30% proximal LAD, 20% ostial left circumflex artery, 25% proximal RCA, moderate to severe aortic stenosis with mean gradient 27 mmHg, peak gradient 30 mmHg, severe mitral annular calcification.  It is recommended to consider TAVR procedure later once our national contrast shortage resolved, aortic stenosis is not critical at this time.  Patient presents today for follow-up.  She has been doing well since discharge.  She denies any further shortness of breath with exertion.  Her chest pain has also resolved as well.  She has no lower extremity edema and that she seems to be euvolemic on exam.  Blood pressure is borderline elevated, I recommended continue on his current therapy.  With moderate to severe aortic stenosis, I am hesitant to push the blood pressure too low.  I will check with Dr. [name] to see how early he wished to refer the patient to our structural heart team to consider TAVR  procedure.  Otherwise she can follow-up with Dr. [name] in 3 months.    Past Medical History:  Diagnosis Date   Arthritis    OA   Blood transfusion    1980   Diabetes mellitus    Dysrhythmia    Female bladder prolapse    GERD 11/18/2008   Qualifier: Diagnosis of  By: Craige Cotta     GERD (gastroesophageal reflux disease)    Glaucoma    Heart disease    Hyperlipidemia    Hypertension    echo and stress 4/10 reports on chart, EKG ` LOV 9/12 on chart   Mild aortic stenosis    Thyroid disease     Past Surgical History:  Procedure Laterality Date   ABDOMINAL HYSTERECTOMY     ANTERIOR AND POSTERIOR REPAIR  04/26/2011   Procedure: ANTERIOR (CYSTOCELE) AND POSTERIOR REPAIR (RECTOCELE);  Surgeon: Reece Packer, MD;  Location: [facility];  Service: Urology;  Laterality: N/A;   BIOPSY  05/25/2020   Procedure: BIOPSY;  Surgeon: Doran Stabler, MD;  Location: [facility];  Service: Gastroenterology;;   CATARACT EXTRACTION Bilateral    with IOL   CHOLECYSTECTOMY  2009   COLONOSCOPY N/A 12/02/2013   three colon polyps removed, small internal hemorrhoids. Hyperplastic polyps   COLONOSCOPY N/A 11/20/2019   pancolonic diverticulosis, two 10-11 mm polyps in ascending colon, one 5 mm polyp in cecum, ascending colon with superficially invasive adenocarcinoma arising in background of sessile serrated polyps  with low and high grade cytologic dysplasia.   COLONOSCOPY     COLONOSCOPY W/ POLYPECTOMY     COLONOSCOPY WITH PROPOFOL N/A 05/25/2020   diverticulosis in right colon, redundant colon. Caution warranted on future colonoscopy in light of age, cardiac condition, challenging anatomy.    ESOPHAGOGASTRODUODENOSCOPY (EGD) WITH PROPOFOL N/A 05/25/2020   Grade 1 esophageal varices, single mucosal nodule in stomach s/p biopsy. (hyperplastic)>    LEFT HEART CATH  09/10/2008   normal coronary arteries, normal LV systolic function, EF 32% (Dr. [name])   [place] Right 1997   under arm    NM MYOCAR PERF WALL MOTION  2010   dipyridamole - mild-mod in intenstiy perfusion defect in mid anterior, mid anteroseptal wall, EF 70%   OVARY SURGERY     bilateral tumors removed   POLYPECTOMY  11/20/2019   Procedure: POLYPECTOMY;  Surgeon: Daneil Dolin, MD;  [facility]: [facility];  Service: Endoscopy;;  hot and cold snare cecal polyp, and asending polyps x 2   RIGHT/LEFT HEART CATH AND CORONARY ANGIOGRAPHY N/A 10/02/2020   Procedure: RIGHT/LEFT HEART CATH AND CORONARY ANGIOGRAPHY;  Surgeon: Martinique, Peter M, MD;  Location: [facility];  Service: Cardiovascular;  Laterality: N/A;   THYROIDECTOMY     THYROIDECTOMY  02/2008   TRANSTHORACIC ECHOCARDIOGRAM  08/2011   EF=>55%, mild conc LVH; trace MR; mild TR; mild-mod AV calcification with mild valvular AV stenosis   VAGINAL PROLAPSE REPAIR  04/26/2011   Procedure: VAGINAL VAULT SUSPENSION;  Surgeon: Reece Packer, MD;  Location: [facility];  Service: Urology;  Laterality: N/A;  with Graft  10x6    Current Medications: Current Meds  Medication Sig   Accu-Chek FastClix Lancets MISC 1 each by Other route 2 (two) times daily.   ALPHAGAN P 0.1 % SOLN Place 1 drop into both eyes 2 (two) times daily.   amLODipine (NORVASC) 10 MG tablet TAKE 1 TABLET(10 MG) BY MOUTH EVERY MORNING (Patient taking differently: Take 10 mg by mouth in the morning.)   Ascorbic Acid (VITAMIN C) 1000 MG tablet Take 1,000 mg by mouth 4 (four) times a week. AT NIGHT   aspirin EC 81 MG tablet Take 81 mg by mouth every other day. In the morning. Swallow whole.   benazepril (LOTENSIN) 40 MG tablet TAKE 1 TABLET(40 MG) BY MOUTH DAILY (Patient taking differently: Take 40 mg by mouth in the morning.)   Blood Glucose Monitoring Suppl (ACCU-CHEK GUIDE) w/Device KIT 1 each by Does not apply route 4 (four) times daily.   cholecalciferol (VITAMIN D3) 25 MCG (1000 UT) tablet Take 1,000 Units by mouth in the morning.   clotrimazole-betamethasone (LOTRISONE) cream Apply twice  daily for 1 week, to affected area , then as needed (Patient taking differently: Apply 1 application topically 2 (two) times daily as needed (itching/skin irritation).)   glipiZIDE (GLUCOTROL XL) 5 MG 24 hr tablet Take 1 tablet (5 mg total) by mouth daily with breakfast.   glucose blood (ACCU-CHEK GUIDE) test strip USE TO CHECK BLOOD SUGAR TWICE DAILY   insulin detemir (LEVEMIR FLEXTOUCH) 100 UNIT/ML FlexPen Inject 60 Units into the skin in the morning and at bedtime.   Insulin Pen Needle (B-D ULTRAFINE III SHORT PEN) 31G X 8 MM MISC USE AS DIRECTED TWO TIMES DAILY WITH INSULIN PENS   latanoprost (XALATAN) 0.005 % ophthalmic solution Place 1 drop into both eyes at bedtime.    metFORMIN (GLUCOPHAGE) 500 MG tablet TAKE 1 TABLET BY MOUTH EVERY DAY  WITH BREAKFAST (Patient taking differently: Take 500 mg by mouth daily with breakfast.)   Multiple Vitamin (MULTIVITAMIN WITH MINERALS) TABS tablet Take 1 tablet by mouth daily.   Omega-3 Fatty Acids (FISH OIL) 1200 MG CAPS Take 1,200 mg by mouth every morning.   pantoprazole (PROTONIX) 40 MG tablet Take 1 tablet (40 mg total) by mouth daily. 30 minutes before meal (Patient taking differently: Take 40 mg by mouth daily before breakfast. 30 minutes before meal)   polyethylene glycol (MIRALAX) 17 g packet Take 17 g by mouth at bedtime. (Patient taking differently: Take 17 g by mouth every other day. IN THE MORNING)   triamterene-hydrochlorothiazide (MAXZIDE) 75-50 MG tablet Take 0.5 tablets by mouth daily.   [DISCONTINUED] ezetimibe (ZETIA) 10 MG tablet TAKE 1 TABLET(10 MG) BY MOUTH DAILY (Patient taking differently: Take 10 mg by mouth in the morning.)   [DISCONTINUED] pravastatin (PRAVACHOL) 80 MG tablet TAKE 1 TABLET(80 MG) BY MOUTH DAILY (Patient taking differently: Take 80 mg by mouth in the morning.)     Allergies:   Senokot wheat bran [wheat bran] and Spironolactone   Social History   Socioeconomic History   Marital status: Married    Spouse name:  [name]   Number of children: 1   Years of education: Trade   Highest education level: 12th grade  Occupational History   Occupation: Retired  Tobacco Use   Smoking status: Never   Smokeless tobacco: Never  Vaping Use   Vaping Use: Never used  Substance and Sexual Activity   Alcohol use: No    Comment: socially- none x 30 years   Drug use: No   Sexual activity: Yes  Other Topics Concern   Not on file  Social History Narrative   Patient lives at home with spouse. 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 LIVED IN [place] FOR 50 YRS AND CAME BACK TO [place] ~2009. Caffeine Use: 1 cup of coffee daily. HAD ONE CHILD: PASSED 5 YRS. HAVE THREE GRAND-KIDS AND TWO GREAT GRANDS.    Social Determinants of Health   Financial Resource Strain: Low Risk    Difficulty of Paying Living Expenses: Not hard at all  Food Insecurity: No Food Insecurity   Worried About Charity fundraiser in the Last Year: Never true   [place] in the Last Year: Never true  Transportation Needs: No Transportation Needs   Lack of Transportation (Medical): No   Lack of Transportation (Non-Medical): No  Physical Activity: Sufficiently Active   Days of Exercise per Week: 5 days   Minutes of Exercise per Session: 30 min  Stress: No Stress Concern Present   Feeling of Stress : Not at all  Social Connections: Moderately Integrated   Frequency of Communication with Friends and Family: More than three times a week   Frequency of Social Gatherings with Friends and Family: Twice a week   Attends Religious Services: More than 4 times per year   Active Member of [employer] or Organizations: No   Attends Music therapist: Never   Marital Status: Married     Family History: The patient's family history includes Bone cancer in her brother; Heart attack in her paternal grandfather; Heart disease (age of onset: 73) in her father; Heart disease (age of onset: 30) in her mother; Hypertension in  her brother, child, sister, and sister; Liver cancer in her sister; Stomach cancer (age of onset: 22) in her mother; Stroke in her maternal grandfather. There is  no history of Colon cancer, Colon polyps, Pancreatic cancer, Esophageal cancer, or Rectal cancer.  ROS:   Please see the history of present illness.     All other systems reviewed and are negative.  EKGs/Labs/Other Studies Reviewed:    The following studies were reviewed today:  Cath 10/02/2020 Prox LAD to Mid LAD lesion is 30% stenosed. Ost Cx to Mid Cx lesion is 20% stenosed. Prox RCA lesion is 25% stenosed. LV end diastolic pressure is normal. There is moderate aortic valve stenosis.   1. Mild nonobstructive CAD 2. Moderate to severe aortic stenosis AV mean gradient 27 mm Hg, peak gradient 30 mm Hg 3. Severe mitral annular calcification 4. Normal right heart and LV filling pressures. 5. Normal cardiac output.   Plan: patient should be considered for TAVR. As valve is not critical this could await resolution of our contrast shortage.    EKG:  EKG is not ordered today.   Recent Labs: 03/02/2020: TSH 2.90 08/26/2020: ALT 27 09/24/2020: BUN 16; Creatinine, Ser 1.09; Platelets 205 10/02/2020: Hemoglobin 11.9; Potassium 3.3; Sodium 139  Recent Lipid Panel    Component Value Date/Time   CHOL 176 08/26/2020 0921   TRIG 194 (H) 08/26/2020 0921   HDL 41 08/26/2020 0921   CHOLHDL 4.3 08/26/2020 0921   CHOLHDL 4.7 07/30/2019 0718   VLDL 39 12/05/2017 0832   LDLCALC 101 (H) 08/26/2020 0921   LDLCALC 99 07/30/2019 0718     Risk Assessment/Calculations:       Physical Exam:    VS:  BP (!) 144/70 (BP Location: Left Arm, Patient Position: Sitting)   Pulse 65   Ht _0  (1.651 m)   Wt 182 lb 9.6 oz (82.8 kg)   SpO2 99%   BMI 30.39 kg/m     Wt Readings from Last 3 Encounters:  10/23/20 182 lb 9.6 oz (82.8 kg)  10/02/20 184 lb (83.5 kg)  09/24/20 184 lb (83.5 kg)     GEN:  Well nourished, well developed in no  acute distress HEENT: Normal NECK: No JVD; No carotid bruits LYMPHATICS: No lymphadenopathy CARDIAC: RRR, no rubs, gallops. 3/6 murmur at RUSB RESPIRATORY:  Clear to auscultation without rales, wheezing or rhonchi  ABDOMEN: Soft, non-tender, non-distended MUSCULOSKELETAL:  No edema; No deformity  SKIN: Warm and dry NEUROLOGIC:  Alert and oriented x 3 PSYCHIATRIC:  Normal affect   ASSESSMENT:    1. Nonrheumatic aortic valve stenosis   2. Coronary artery disease involving native coronary artery of native heart without angina pectoris   3. Essential hypertension   4. Hyperlipidemia LDL goal <70   5. Hypothyroidism, unspecified type    PLAN:    In order of problems listed above:  Moderate to severe aortic stenosis: This was confirmed on cardiac catheterization as well.  Mean aortic gradient 27 mmHg, peak gradient 30 mmHg.  Patient is likely a TAVR candidate, will check with Dr. [name] to see the timing of referral.  CAD: Mild CAD noted on recent cardiac catheterization  Hypertension: Blood pressure stable  Hyperlipidemia: On Zetia and Pravachol.  We will refill both medication.        Medication Adjustments/Labs and Tests Ordered: Current medicines are reviewed at length with the patient today.  Concerns regarding medicines are outlined above.  No orders of the defined types were placed in this encounter.  Meds ordered this encounter  Medications   ezetimibe (ZETIA) 10 MG tablet    Sig: Take 1 tablet (10 mg total) by mouth daily.  Dispense:  90 tablet    Refill:  3   pravastatin (PRAVACHOL) 80 MG tablet    Sig: Take 1 tablet (80 mg total) by mouth in the morning.    Dispense:  90 tablet    Refill:  3    Patient Instructions  Medication Instructions:  Your physician recommends that you continue on your current medications as directed. Please refer to the Current Medication list given to you today.  *If you need a refill on your cardiac medications before your next  appointment, please call your pharmacy*  Lab Work: NONE ordered at this time of appointment   If you have labs (blood work) drawn today and your tests are completely normal, you will receive your results only by: [place] (if you have MyChart) OR A paper copy in the mail If you have any lab test that is abnormal or we need to change your treatment, we will call you to review the results.  Testing/Procedures: NONE ordered at this time of appointment   Follow-Up: At [facility], you and your health needs are our priority.  As part of our continuing mission to provide you with exceptional heart care, we have created designated Provider Care Teams.  These Care Teams include your primary Cardiologist (physician) and Advanced Practice Providers (APPs -  Physician Assistants and Nurse Practitioners) who all work together to provide you with the care you need, when you need it.  We recommend signing up for the patient portal called "MyChart".  Sign up information is provided on this After Visit Summary.  MyChart is used to connect with patients for Virtual Visits (Telemedicine).  Patients are able to view lab/test results, encounter notes, upcoming appointments, etc.  Non-urgent messages can be sent to your provider as well.   To learn more about what you can do with MyChart, go to [email or web address].    Your next appointment:   3 month(s)  The format for your next appointment:   In Person  Provider:   Raliegh Ip Mali Hilty, MD  Other Instructions    Signed, Joanna Brown, [place]  10/23/2020 10:59 AM    [place]

## 2020-10-23 NOTE — Patient Instructions (Signed)
Medication Instructions:  Your physician recommends that you continue on your current medications as directed. Please refer to the Current Medication list given to you today.  *If you need a refill on your cardiac medications before your next appointment, please call your pharmacy*  Lab Work: NONE ordered at this time of appointment   If you have labs (blood work) drawn today and your tests are completely normal, you will receive your results only by: [place] (if you have MyChart) OR A paper copy in the mail If you have any lab test that is abnormal or we need to change your treatment, we will call you to review the results.  Testing/Procedures: NONE ordered at this time of appointment   Follow-Up: At [facility], you and your health needs are our priority.  As part of our continuing mission to provide you with exceptional heart care, we have created designated Provider Care Teams.  These Care Teams include your primary Cardiologist (physician) and Advanced Practice Providers (APPs -  Physician Assistants and Nurse Practitioners) who all work together to provide you with the care you need, when you need it.  We recommend signing up for the patient portal called "MyChart".  Sign up information is provided on this After Visit Summary.  MyChart is used to connect with patients for Virtual Visits (Telemedicine).  Patients are able to view lab/test results, encounter notes, upcoming appointments, etc.  Non-urgent messages can be sent to your provider as well.   To learn more about what you can do with MyChart, go to [email or web address].    Your next appointment:   3 month(s)  The format for your next appointment:   In Person  Provider:   K. Mali Hilty, MD  Other Instructions

## 2020-10-26 ENCOUNTER — Other Ambulatory Visit: Payer: Self-pay | Admitting: Family Medicine

## 2020-10-27 ENCOUNTER — Encounter: Payer: Self-pay | Admitting: "Endocrinology

## 2020-10-27 ENCOUNTER — Other Ambulatory Visit: Payer: Self-pay

## 2020-10-27 ENCOUNTER — Other Ambulatory Visit: Payer: Self-pay | Admitting: Physician Assistant

## 2020-10-27 ENCOUNTER — Ambulatory Visit (INDEPENDENT_AMBULATORY_CARE_PROVIDER_SITE_OTHER): Payer: Medicare Other | Admitting: "Endocrinology

## 2020-10-27 ENCOUNTER — Telehealth: Payer: Self-pay | Admitting: Physician Assistant

## 2020-10-27 VITALS — BP 120/71 | HR 66 | Ht 65.0 in | Wt 183.4 lb

## 2020-10-27 DIAGNOSIS — E782 Mixed hyperlipidemia: Secondary | ICD-10-CM

## 2020-10-27 DIAGNOSIS — E049 Nontoxic goiter, unspecified: Secondary | ICD-10-CM

## 2020-10-27 DIAGNOSIS — I10 Essential (primary) hypertension: Secondary | ICD-10-CM | POA: Diagnosis not present

## 2020-10-27 DIAGNOSIS — I350 Nonrheumatic aortic (valve) stenosis: Secondary | ICD-10-CM

## 2020-10-27 DIAGNOSIS — N181 Chronic kidney disease, stage 1: Secondary | ICD-10-CM | POA: Diagnosis not present

## 2020-10-27 DIAGNOSIS — I200 Unstable angina: Secondary | ICD-10-CM

## 2020-10-27 DIAGNOSIS — Z794 Long term (current) use of insulin: Secondary | ICD-10-CM

## 2020-10-27 DIAGNOSIS — E1122 Type 2 diabetes mellitus with diabetic chronic kidney disease: Secondary | ICD-10-CM

## 2020-10-27 MED ORDER — NOVOLOG MIX 70/30 FLEXPEN (70-30) 100 UNIT/ML ~~LOC~~ SUPN: 60.0000 [IU] | PEN_INJECTOR | Freq: Two times a day (BID) | SUBCUTANEOUS | 2 refills | Status: DC

## 2020-10-27 NOTE — Telephone Encounter (Signed)
Called patient to schedule appt. She says ever since her heart cath she has had no shortness of breath or chest pain. She is feeling "pretty good" and wanted to hold off on scheduling an appt right now. She was agreeable to seeing Dr. Angelena Form in September. Will update an echo prior to that apt since last one was in January

## 2020-10-27 NOTE — Telephone Encounter (Signed)
Placed referral order to Dr. Burt Knack at [facility] clinic.

## 2020-10-27 NOTE — Patient Instructions (Signed)

## 2020-10-27 NOTE — Progress Notes (Signed)
10/27/2020                          Endocrinology follow-up note   Subjective:    Patient ID: Joanna Brown, female    DOB: 1940-10-18. She is being seen in follow-up for uncontrolled type 2 diabetes, complicated by CKD, hyperlipidemia, hypertension.     PMD:   Fayrene Helper, MD  Past Medical History:  Diagnosis Date   Arthritis    OA   Blood transfusion    1980   Diabetes mellitus    Dysrhythmia    Female bladder prolapse    GERD 11/18/2008   Qualifier: Diagnosis of  By: Craige Cotta     GERD (gastroesophageal reflux disease)    Glaucoma    Heart disease    Hyperlipidemia    Hypertension    echo and stress 4/10 reports on chart, EKG ` LOV 9/12 on chart   Mild aortic stenosis    Thyroid disease    Past Surgical History:  Procedure Laterality Date   ABDOMINAL HYSTERECTOMY     ANTERIOR AND POSTERIOR REPAIR  04/26/2011   Procedure: ANTERIOR (CYSTOCELE) AND POSTERIOR REPAIR (RECTOCELE);  Surgeon: Reece Packer, MD;  Location: [facility];  Service: Urology;  Laterality: N/A;   BIOPSY  05/25/2020   Procedure: BIOPSY;  Surgeon: Doran Stabler, MD;  Location: [facility];  Service: Gastroenterology;;   CATARACT EXTRACTION Bilateral    with IOL   CHOLECYSTECTOMY  2009   COLONOSCOPY N/A 12/02/2013   three colon polyps removed, small internal hemorrhoids. Hyperplastic polyps   COLONOSCOPY N/A 11/20/2019   pancolonic diverticulosis, two 10-11 mm polyps in ascending colon, one 5 mm polyp in cecum, ascending colon with superficially invasive adenocarcinoma arising in background of sessile serrated polyps with low and high grade cytologic dysplasia.   COLONOSCOPY     COLONOSCOPY W/ POLYPECTOMY     COLONOSCOPY WITH PROPOFOL N/A 05/25/2020   diverticulosis in right colon, redundant colon. Caution warranted on future colonoscopy in light of age, cardiac condition, challenging anatomy.    ESOPHAGOGASTRODUODENOSCOPY (EGD) WITH PROPOFOL N/A 05/25/2020   Grade 1 esophageal  varices, single mucosal nodule in stomach s/p biopsy. (hyperplastic)>    LEFT HEART CATH  09/10/2008   normal coronary arteries, normal LV systolic function, EF 00% (Dr. [name])   [place] Right 1997   under arm   NM MYOCAR PERF WALL MOTION  2010   dipyridamole - mild-mod in intenstiy perfusion defect in mid anterior, mid anteroseptal wall, EF 70%   OVARY SURGERY     bilateral tumors removed   POLYPECTOMY  11/20/2019   Procedure: POLYPECTOMY;  Surgeon: Daneil Dolin, MD;  Location: [facility];  Service: Endoscopy;;  hot and cold snare cecal polyp, and asending polyps x 2   RIGHT/LEFT HEART CATH AND CORONARY ANGIOGRAPHY N/A 10/02/2020   Procedure: RIGHT/LEFT HEART CATH AND CORONARY ANGIOGRAPHY;  Surgeon: Martinique, Peter M, MD;  Location: [facility];  Service: Cardiovascular;  Laterality: N/A;   THYROIDECTOMY     THYROIDECTOMY  02/2008   TRANSTHORACIC ECHOCARDIOGRAM  08/2011   EF=>55%, mild conc LVH; trace MR; mild TR; mild-mod AV calcification with mild valvular AV stenosis   VAGINAL PROLAPSE REPAIR  04/26/2011   Procedure: VAGINAL VAULT SUSPENSION;  Surgeon: Reece Packer, MD;  Location: [facility];  Service: Urology;  Laterality: N/A;  with Graft  10x6   Social History   Socioeconomic  History   Marital status: Married    Spouse name: [name]   Number of children: 1   Years of education: Trade   Highest education level: 12th grade  Occupational History   Occupation: Retired  Tobacco Use   Smoking status: Never   Smokeless tobacco: Never  Vaping Use   Vaping Use: Never used  Substance and Sexual Activity   Alcohol use: No    Comment: socially- none x 30 years   Drug use: No   Sexual activity: Yes  Other Topics Concern   Not on file  Social History Narrative   Patient lives at home with spouse. 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 LIVED IN [place] FOR 50 YRS AND CAME BACK TO [place] ~2009. Caffeine Use: 1 cup of coffee daily. HAD  ONE CHILD: PASSED 5 YRS. HAVE THREE GRAND-KIDS AND TWO GREAT GRANDS.    Social Determinants of Health   Financial Resource Strain: Low Risk    Difficulty of Paying Living Expenses: Not hard at all  Food Insecurity: No Food Insecurity   Worried About Charity fundraiser in the Last Year: Never true   [place] in the Last Year: Never true  Transportation Needs: No Transportation Needs   Lack of Transportation (Medical): No   Lack of Transportation (Non-Medical): No  Physical Activity: Sufficiently Active   Days of Exercise per Week: 5 days   Minutes of Exercise per Session: 30 min  Stress: No Stress Concern Present   Feeling of Stress : Not at all  Social Connections: Moderately Integrated   Frequency of Communication with Friends and Family: More than three times a week   Frequency of Social Gatherings with Friends and Family: Twice a week   Attends Religious Services: More than 4 times per year   Active Member of [employer] or Organizations: No   Attends Archivist Meetings: Never   Marital Status: Married   Outpatient Encounter Medications as of 10/27/2020  Medication Sig   insulin aspart protamine - aspart (NOVOLOG MIX 70/30 FLEXPEN) (70-30) 100 UNIT/ML FlexPen Inject 0.6 mLs (60 Units total) into the skin 2 (two) times daily with a meal.   Accu-Chek FastClix Lancets MISC 1 each by Other route 2 (two) times daily.   ALPHAGAN P 0.1 % SOLN Place 1 drop into both eyes 2 (two) times daily.   amLODipine (NORVASC) 10 MG tablet TAKE 1 TABLET(10 MG) BY MOUTH EVERY MORNING (Patient taking differently: Take 10 mg by mouth in the morning.)   Ascorbic Acid (VITAMIN C) 1000 MG tablet Take 1,000 mg by mouth 4 (four) times a week. AT NIGHT   aspirin EC 81 MG tablet Take 81 mg by mouth every other day. In the morning. Swallow whole.   benazepril (LOTENSIN) 40 MG tablet TAKE 1 TABLET(40 MG) BY MOUTH DAILY (Patient taking differently: Take 40 mg by mouth in the morning.)   Blood Glucose  Monitoring Suppl (ACCU-CHEK GUIDE) w/Device KIT 1 each by Does not apply route 4 (four) times daily.   cholecalciferol (VITAMIN D3) 25 MCG (1000 UT) tablet Take 1,000 Units by mouth in the morning.   clotrimazole-betamethasone (LOTRISONE) cream Apply twice daily for 1 week, to affected area , then as needed (Patient taking differently: Apply 1 application topically 2 (two) times daily as needed (itching/skin irritation).)   ezetimibe (ZETIA) 10 MG tablet Take 1 tablet (10 mg total) by mouth daily.   glipiZIDE (GLUCOTROL XL) 5 MG 24 hr tablet  Take 1 tablet (5 mg total) by mouth daily with breakfast.   glucose blood (ACCU-CHEK GUIDE) test strip USE TO CHECK BLOOD SUGAR TWICE DAILY   Insulin Pen Needle (B-D ULTRAFINE III SHORT PEN) 31G X 8 MM MISC USE AS DIRECTED TWO TIMES DAILY WITH INSULIN PENS   latanoprost (XALATAN) 0.005 % ophthalmic solution Place 1 drop into both eyes at bedtime.    metFORMIN (GLUCOPHAGE) 500 MG tablet TAKE 1 TABLET BY MOUTH EVERY DAY WITH BREAKFAST   Multiple Vitamin (MULTIVITAMIN WITH MINERALS) TABS tablet Take 1 tablet by mouth daily.   Omega-3 Fatty Acids (FISH OIL) 1200 MG CAPS Take 1,200 mg by mouth every morning.   pantoprazole (PROTONIX) 40 MG tablet Take 1 tablet (40 mg total) by mouth daily. 30 minutes before meal (Patient taking differently: Take 40 mg by mouth daily before breakfast. 30 minutes before meal)   polyethylene glycol (MIRALAX) 17 g packet Take 17 g by mouth at bedtime. (Patient taking differently: Take 17 g by mouth every other day. IN THE MORNING)   pravastatin (PRAVACHOL) 80 MG tablet Take 1 tablet (80 mg total) by mouth in the morning.   triamterene-hydrochlorothiazide (MAXZIDE) 75-50 MG tablet Take 0.5 tablets by mouth daily.   [DISCONTINUED] insulin detemir (LEVEMIR FLEXTOUCH) 100 UNIT/ML FlexPen Inject 60 Units into the skin in the morning and at bedtime.   No facility-administered encounter medications on file as of 10/27/2020.    ALLERGIES: Allergies  Allergen Reactions   Senokot Wheat Bran [Wheat Bran]     ABDOMINAL CRAMPS   Spironolactone     Stomach problems, vision changes    VACCINATION STATUS: Immunization History  Administered Date(s) Administered   Moderna Sars-Covid-2 Vaccination 09/13/2019, 10/12/2019, 04/29/2020   Pneumococcal Conjugate-13 12/11/2013   Pneumococcal Polysaccharide-23 01/13/2010   Tdap 10/05/2010    Diabetes She presents for her follow-up diabetic visit. She has type 2 diabetes mellitus. Onset time: She was diagnosed at approximate age of 36 years. Her disease course has been worsening. There are no hypoglycemic associated symptoms. Pertinent negatives for hypoglycemia include no confusion, headaches, pallor or seizures. Pertinent negatives for diabetes include no blurred vision, no chest pain, no fatigue, no polydipsia, no polyphagia and no polyuria. There are no hypoglycemic complications. Symptoms are worsening. Diabetic complications include nephropathy and retinopathy. Risk factors for coronary artery disease include dyslipidemia, diabetes mellitus, obesity and sedentary lifestyle. Her weight is fluctuating minimally. She is following a generally unhealthy diet. When asked about meal planning, she reported none. She has had a previous visit with a dietitian. She rarely participates in exercise. Her home blood glucose trend is increasing steadily. Her breakfast blood glucose range is generally 140-180 mg/dl. Her bedtime blood glucose range is generally 180-200 mg/dl. Her overall blood glucose range is 180-200 mg/dl. (She presents with still above target glycemic profile both fasting and postprandial.  Her recent A1c was 5 8.4%.  She did not document any hypoglycemia.    ) Eye exam is current.  Hyperlipidemia This is a chronic problem. The current episode started more than 1 year ago. The problem is uncontrolled. Exacerbating diseases include diabetes and obesity. Pertinent negatives  include no chest pain, myalgias or shortness of breath. Current antihyperlipidemic treatment includes statins. Risk factors for coronary artery disease include dyslipidemia, diabetes mellitus, hypertension, obesity, a sedentary lifestyle and post-menopausal.  Hypertension This is a chronic problem. The current episode started more than 1 year ago. Pertinent negatives include no blurred vision, chest pain, headaches, palpitations or shortness of breath. Risk  factors for coronary artery disease include dyslipidemia, diabetes mellitus, obesity and sedentary lifestyle. Hypertensive end-organ damage includes retinopathy.  Nodular goiter She Patient is being seen today for a new issue with her thyroid.  She was found to have 2.6 cm nodule on right lobe of her thyroid while undergoing CT scan of the chest during cancer surveillance.  She has previous history of left hemithyroidectomy approximately 10 years ago for multinodular goiter.  She is not on thyroid hormone supplement or replacement.  She denies dysphagia, shortness of breath, nor voice change. -Her subsequent thyroid ultrasound confirms multinodular right lobe of the thyroid with surgically absent left lobe.  Her right thyroid lobe nodule did not meet criteria for biopsy.  Review of systems  Constitutional: + Minimally fluctuating body weight, current  Body mass index is 30.52 kg/m. , no fatigue, no subjective hyperthermia, no subjective hypothermia    Objective:    BP 120/71   Pulse 66   Ht '5\' 5"'  (1.651 m)   Wt 183 lb 6.4 oz (83.2 kg)   BMI 30.52 kg/m   Wt Readings from Last 3 Encounters:  10/27/20 183 lb 6.4 oz (83.2 kg)  10/23/20 182 lb 9.6 oz (82.8 kg)  10/02/20 184 lb (83.5 kg)    Physical Exam- Limited  Constitutional:  Body mass index is 30.52 kg/m. , not in acute distress, normal state of mind    CMP ( most recent) CMP     Component Value Date/Time   NA 139 10/02/2020 1029   NA 131 (L) 09/24/2020 1018   K 3.3 (L)  10/02/2020 1029   CL 89 (L) 09/24/2020 1018   CO2 26 09/24/2020 1018   GLUCOSE 230 (H) 09/24/2020 1018   GLUCOSE 104 (H) 05/05/2020 1029   BUN 16 09/24/2020 1018   CREATININE 1.09 (H) 09/24/2020 1018   CREATININE 1.11 (H) 03/02/2020 0848   CALCIUM 10.8 (H) 09/24/2020 1018   PROT 7.8 08/26/2020 0921   ALBUMIN 4.6 08/26/2020 0921   AST 28 08/26/2020 0921   ALT 27 08/26/2020 0921   ALKPHOS 60 08/26/2020 0921   BILITOT 0.3 08/26/2020 0921   GFRNONAA 57 (L) 07/30/2019 0718   GFRAA 66 07/30/2019 0718    Diabetic Labs (most recent): Lab Results  Component Value Date   HGBA1C 8.4 (A) 09/08/2020   HGBA1C 8.6 (A) 06/10/2020   HGBA1C 8.8 (A) 03/10/2020    Lipid Panel     Component Value Date/Time   CHOL 176 08/26/2020 0921   TRIG 194 (H) 08/26/2020 0921   HDL 41 08/26/2020 0921   CHOLHDL 4.3 08/26/2020 0921   CHOLHDL 4.7 07/30/2019 0718   VLDL 39 12/05/2017 0832   LDLCALC 101 (H) 08/26/2020 0921   LDLCALC 99 07/30/2019 0718   Incidental finding on chest CT on December 05, 2019 Enlarged  multinodular remnant right thyroid with dominant 2.6 cm hypodense Nodule.   Thyroid ultrasound on December 19, 2019: Right lobe 4.4 cm, left lobe absent surgically. No adenopathy   IMPRESSION: Surgical changes of left hemithyroidectomy.  Multinodular thyroid.  3.9 cm and 1.6 cm cystic/almost completely cystic nodules with smooth margins, No thyroid nodule meets criteria for biopsy or surveillance, as designated by the newly established ACR TI-RADS criteria.   Assessment & Plan:     1.  Nodular goiter: Patient with remote past history of left hemithyroidectomy for multinodular goiter.  She did not require thyroid hormone supplement.  She was incidentally found to have 2.6 cm nodule in the right lobe. -Her  subsequent dedicated thyroid ultrasound confirmed 2 nodules on the right lobe of her thyroid which did not meet any criteria for biopsy. -She would not need any antithyroid intervention at  this time, TFTs are consistent with euthyroid state.  She will be considered for repeat ultrasound in 1 year.  2. Type 2 diabetes mellitus with stage 1 chronic kidney disease, with long-term current use of insulin ([place])   She presents with still above target glycemic profile both fasting and postprandial.  Her recent A1c was 5 8.4%.  She did not document any hypoglycemia.     Recent labs reviewed, showing improving renal function.     Her diabetes is complicated by CKD obesity/sedentary life and patient remains at a high risk for more acute and chronic complications of diabetes which include CAD, CVA, CKD, retinopathy, and neuropathy. These are all discussed in detail with the patient.  - I have counseled the patient on diet management and weight loss, by adopting a carbohydrate restricted/protein rich diet.  -She still admits to dietary indiscretions including consumption of sweets and sweetened beverages.  - she acknowledges that there is a room for improvement in her food and drink choices. - Suggestion is made for her to avoid simple carbohydrates  from her diet including Cakes, Sweet Desserts, Ice Cream, Soda (diet and regular), Sweet Tea, Candies, Chips, Cookies, Store Bought Juices, Alcohol in Excess of  1-2 drinks a day, Artificial Sweeteners,  Coffee Creamer, and "Sugar-free" Products, Lemonade. This will help patient to have more stable blood glucose profile and potentially avoid unintended weight gain.   - I encouraged the patient to switch to  unprocessed or minimally processed complex starch and increased protein intake (animal or plant source), fruits, and vegetables.  - Patient is advised to stick to a routine mealtimes to eat 3 meals  a day and avoid unnecessary snacks ( to snack only to correct hypoglycemia).   - I have approached patient with the following individualized plan to manage diabetes and patient agrees:   -In light of her presentation with above target glycemic  profile, she will continue to need multiple daily injections of insulin in order for her to achieve and maintain control of diabetes to target.    -Accordingly, her insulin will be switched to premixed NovoLog 70/30.   -She is advised to discontinue Levemir.  I discussed and initiated NovoLog 70/30 60 units with breakfast and 60 units with supper when Premeal blood glucose readings are above 90 mg per DL.   -She is encouraged to start monitoring blood glucose 4 times a day-before meals and at bedtime .  -Patient is encouraged to call clinic for blood glucose levels less than 70 or above 200 mg /dl. -She is advised to to continue glipizide 5 mg XL p.o. daily at breakfast, and metformin 500 mg p.o. daily at breakfast.   I prescribed a new meter and testing supplies for her.   - Patient specific target  A1c;  LDL, HDL, Triglycerides, were discussed in detail.  3) BP/HTN:  -Her blood pressure is controlled to target.   She is advised to continue her current blood pressure medications including benazepril 40 mg p.o. daily.   4) Lipids/HPL: Her recent lipid panel showed uncontrolled LDL at 97.  She is advised to continue pravastatin 80 mg p.o. nightly.   Side effects and precautions discussed with her.           5)  Weight/Diet: Her BMI is 30.5-complicating her diabetes care.-she is  a candidate for moderate weight loss.  CDE Consult has been initiated , exercise, and detailed carbohydrates information provided.  6) Chronic Care/Health Maintenance:  -Patient is on ACEI/ARB and Statin medications and encouraged to continue to follow up with Ophthalmology, Podiatrist at least yearly or according to recommendations, and advised to  stay away from smoking. I have recommended yearly flu vaccine and pneumonia vaccination at least every 5 years; moderate intensity exercise for up to 150 minutes weekly; and  sleep for at least 7 hours a day.  POCT ABI for PAD screen was normal on June 10, 2020.  This  test will be repeated in January 2027, or sooner if needed.     - I advised patient to maintain close follow up with Fayrene Helper, MD for primary care needs.   I spent 41 minutes in the care of the patient today including review of labs from [place], Lipids, Thyroid Function, Hematology (current and previous including abstractions from other facilities); face-to-face time discussing  her blood glucose readings/logs, discussing hypoglycemia and hyperglycemia episodes and symptoms, medications doses, her options of short and long term treatment based on the latest standards of care / guidelines;  discussion about incorporating lifestyle medicine;  and documenting the encounter.    Please refer to Patient Instructions for Blood Glucose Monitoring and Insulin/Medications Dosing Guide"  in media tab for additional information. Please  also refer to " Patient Self Inventory" in the Media  tab for reviewed elements of pertinent patient history.  Derinda Late Berger participated in the discussions, expressed understanding, and voiced agreement with the above plans.  All questions were answered to her satisfaction. she is encouraged to contact clinic should she have any questions or concerns prior to her return visit.    Follow up plan: - Return in about 9 weeks (around 12/29/2020) for Bring Meter and Logs- A1c in Office.  Glade Lloyd, MD Phone: [phone]  Fax: [phone]   This note was partially dictated with voice recognition software. Similar sounding words can be transcribed inadequately or may not  be corrected upon review.  10/27/2020, 10:27 AM

## 2020-10-27 NOTE — Telephone Encounter (Signed)
Per Dr. [name], please refer the patient to either Dr. Burt Knack or Dr. Angelena Form of structural heart clinic. Will also attach structural heart APP on this message as well. I attempted to reach the patient this morning, has not been successful, I will call her again during lunch to discuss this with her.

## 2020-10-27 NOTE — Telephone Encounter (Signed)
Hilty, Nadean Corwin, MD  Almyra Deforest, [place] Thanks Isaac Laud - I would go ahead an refer her to the valve clinic now and they can determine when to repeat echo - maybe in 6 months.   -Mali         Previous Messages    ----- Message -----  From: Almyra Deforest, PA  Sent: 10/23/2020   9:46 AM EDT  To: [name], MD  Subject: TAVR referral timing                           Hi:  Dr. [name], I know you are on vacation, not a urgent question. Joanna Brown recently underwent left and right heart cath which shows mild CAD with moderate to severe aortic stenosis. She is doing well, no SOB, dizziness or exertional chest pain. Would you prefer to refer her to structural heart team to consider TAVR now or later? If later, when should she repeat echo?   Thank you  Isaac Laud

## 2020-10-27 NOTE — Telephone Encounter (Signed)
Thank you. Joanna Brown. I think that is quite reasonable, she looks good on the last visit, TAVR would not be urgent.

## 2020-10-29 ENCOUNTER — Ambulatory Visit: Payer: Medicare Other | Admitting: Internal Medicine

## 2020-11-17 ENCOUNTER — Other Ambulatory Visit: Payer: Self-pay | Admitting: Nurse Practitioner

## 2020-11-17 ENCOUNTER — Telehealth: Payer: Self-pay

## 2020-11-17 MED ORDER — INSULIN ISOPHANE & REGULAR (HUMAN 70-30)100 UNIT/ML KWIKPEN: 60.0000 [IU] | PEN_INJECTOR | Freq: Two times a day (BID) | SUBCUTANEOUS | 3 refills | Status: DC

## 2020-11-17 NOTE — Telephone Encounter (Signed)
Patient said she can not afford her Novolog. What else can she take? Please Advise

## 2020-11-17 NOTE — Telephone Encounter (Signed)
Patient notified

## 2020-11-17 NOTE — Telephone Encounter (Signed)
I sent in an alternative to the pharmacy on file.  If this one is too expensive also, she may need to call the insurance company and see which premixed insulin is preferred.

## 2020-11-19 DIAGNOSIS — Z20822 Contact with and (suspected) exposure to covid-19: Secondary | ICD-10-CM | POA: Diagnosis not present

## 2020-11-19 DIAGNOSIS — Z23 Encounter for immunization: Secondary | ICD-10-CM | POA: Diagnosis not present

## 2020-11-23 DIAGNOSIS — H401131 Primary open-angle glaucoma, bilateral, mild stage: Secondary | ICD-10-CM | POA: Diagnosis not present

## 2020-11-23 DIAGNOSIS — Z961 Presence of intraocular lens: Secondary | ICD-10-CM | POA: Diagnosis not present

## 2020-11-23 DIAGNOSIS — Z794 Long term (current) use of insulin: Secondary | ICD-10-CM | POA: Diagnosis not present

## 2020-11-23 DIAGNOSIS — E119 Type 2 diabetes mellitus without complications: Secondary | ICD-10-CM | POA: Diagnosis not present

## 2020-11-23 LAB — HM DIABETES EYE EXAM

## 2020-11-24 DIAGNOSIS — Z20822 Contact with and (suspected) exposure to covid-19: Secondary | ICD-10-CM | POA: Diagnosis not present

## 2020-12-01 ENCOUNTER — Other Ambulatory Visit: Payer: Self-pay

## 2020-12-01 ENCOUNTER — Ambulatory Visit (INDEPENDENT_AMBULATORY_CARE_PROVIDER_SITE_OTHER): Payer: Medicare Other | Admitting: Podiatry

## 2020-12-01 DIAGNOSIS — B351 Tinea unguium: Secondary | ICD-10-CM

## 2020-12-01 DIAGNOSIS — M79674 Pain in right toe(s): Secondary | ICD-10-CM

## 2020-12-01 DIAGNOSIS — M79675 Pain in left toe(s): Secondary | ICD-10-CM | POA: Diagnosis not present

## 2020-12-04 ENCOUNTER — Encounter: Payer: Self-pay | Admitting: Podiatry

## 2020-12-04 NOTE — Progress Notes (Signed)
  Subjective:  Patient ID: Joanna Brown, female    DOB: Sep 24, 1940,  MRN: [id number]  80 y.o. female presents with preventative diabetic foot care and painful thick toenails that are difficult to trim. Pain interferes with ambulation. Aggravating factors include wearing enclosed shoe gear. Pain is relieved with periodic professional debridement..    Patient's blood sugar was 123 mg/dl today. She is accompanied by her husband who also has an appointment on today.  PCP: Fayrene Helper, MD and last visit was:   Review of Systems: Negative except as noted in the HPI.   Allergies  Allergen Reactions   Senokot Wheat Bran [Wheat Bran]     ABDOMINAL CRAMPS   Spironolactone     Stomach problems, vision changes     Objective:  There were no vitals filed for this visit. Constitutional Patient is a pleasant 80 y.o. African American female in NAD. AAO x 3.  Vascular Capillary refill time to digits immediate b/l. Palpable pedal pulses b/l LE. Pedal hair sparse. Lower extremity skin temperature gradient within normal limits. No pain with calf compression b/l. No edema noted b/l lower extremities. No cyanosis or clubbing noted.  Neurologic Normal speech. Protective sensation intact 5/5 intact bilaterally with 10g monofilament b/l.  Dermatologic Pedal skin with normal turgor, texture and tone b/l lower extremities. No open wounds b/l lower extremities. No interdigital macerations b/l lower extremities. Toenails 1-5 b/l elongated, discolored, dystrophic, thickened, crumbly with subungual debris and tenderness to dorsal palpation. Cracked mycotic toenail left hallux which has caused ingrown toenail. No erythema, no edema, no drainage, no fluctuance.  Orthopedic: Normal muscle strength 5/5 to all lower extremity muscle groups bilaterally. No pain crepitus or joint limitation noted with ROM b/l. Hallux valgus with bunion deformity noted b/l lower extremities.   Hemoglobin A1C Latest Ref Rng & Units  09/08/2020 06/10/2020 03/10/2020  HGBA1C 0.0 - 7.0 % 8.4(A) 8.6(A) 8.8(A)  Some recent data might be hidden   Assessment:   1. Pain due to onychomycosis of toenails of both feet    Plan:  Patient was evaluated and treated and all questions answered.  Onychomycosis with pain -Nails palliatively debridement as below. -Educated on self-care  Procedure: Nail Debridement Rationale: Pain Type of Debridement: manual, sharp debridement. Instrumentation: Nail nipper, rotary burr. Number of Nails: 10  -Examined patient. -Patient to continue soft, supportive shoe gear daily. -Discussed treatment options for onychomycosis. Patient opted for topical topical therapy. Patient is to apply Formula 7 Emulsion to affected toenail(s) once daily. -Toenails 1-5 b/l were debrided in length and girth with sterile nail nippers and dremel without iatrogenic bleeding.  -Offending nail border debrided and curretaged L hallux utilizing sterile nail nipper and currette. Border(s) cleansed with alcohol and triple antibiotic ointment applied. Patient instructed to apply Neosporin to L hallux once daily for 7 days. -Patient to report any pedal injuries to medical professional immediately. -Patient/POA to call should there be question/concern in the interim.  Return in about 10 weeks (around 02/09/2021).  Marzetta Board, DPM

## 2020-12-07 ENCOUNTER — Ambulatory Visit (HOSPITAL_COMMUNITY)
Admission: RE | Admit: 2020-12-07 | Discharge: 2020-12-07 | Disposition: A | Discharge status: Home / Self Care | Payer: Medicare Other | Source: Ambulatory Visit | Attending: Family Medicine | Admitting: Family Medicine

## 2020-12-07 ENCOUNTER — Other Ambulatory Visit: Payer: Self-pay

## 2020-12-07 DIAGNOSIS — Z1231 Encounter for screening mammogram for malignant neoplasm of breast: Secondary | ICD-10-CM | POA: Insufficient documentation

## 2020-12-31 ENCOUNTER — Encounter: Payer: Self-pay | Admitting: "Endocrinology

## 2020-12-31 ENCOUNTER — Ambulatory Visit (INDEPENDENT_AMBULATORY_CARE_PROVIDER_SITE_OTHER): Payer: Medicare Other | Admitting: "Endocrinology

## 2020-12-31 ENCOUNTER — Other Ambulatory Visit: Payer: Self-pay

## 2020-12-31 VITALS — BP 128/72 | HR 64 | Ht 65.0 in | Wt 187.0 lb

## 2020-12-31 DIAGNOSIS — E782 Mixed hyperlipidemia: Secondary | ICD-10-CM

## 2020-12-31 DIAGNOSIS — Z794 Long term (current) use of insulin: Secondary | ICD-10-CM | POA: Diagnosis not present

## 2020-12-31 DIAGNOSIS — E1122 Type 2 diabetes mellitus with diabetic chronic kidney disease: Secondary | ICD-10-CM

## 2020-12-31 DIAGNOSIS — N181 Chronic kidney disease, stage 1: Secondary | ICD-10-CM | POA: Diagnosis not present

## 2020-12-31 DIAGNOSIS — I200 Unstable angina: Secondary | ICD-10-CM | POA: Diagnosis not present

## 2020-12-31 DIAGNOSIS — I10 Essential (primary) hypertension: Secondary | ICD-10-CM

## 2020-12-31 LAB — POCT GLYCOSYLATED HEMOGLOBIN (HGB A1C): HbA1c, POC (controlled diabetic range): 7.8 % — AB (ref 0.0–7.0)

## 2020-12-31 MED ORDER — INSULIN ISOPHANE & REGULAR (HUMAN 70-30)100 UNIT/ML KWIKPEN: PEN_INJECTOR | SUBCUTANEOUS | 3 refills | Status: DC

## 2020-12-31 NOTE — Progress Notes (Signed)
12/31/2020                     Endocrinology follow-up note   Subjective:    Patient ID: Joanna Brown, female    DOB: July 11, 1940. She is being seen in follow-up for uncontrolled type 2 diabetes, complicated by CKD, hyperlipidemia, hypertension.     PMD:   Fayrene Helper, MD  Past Medical History:  Diagnosis Date   Arthritis    OA   Blood transfusion    1980   Diabetes mellitus    Dysrhythmia    Female bladder prolapse    GERD 11/18/2008   Qualifier: Diagnosis of  By: Craige Cotta     GERD (gastroesophageal reflux disease)    Glaucoma    Heart disease    Hyperlipidemia    Hypertension    echo and stress 4/10 reports on chart, EKG ` LOV 9/12 on chart   Mild aortic stenosis    Thyroid disease    Past Surgical History:  Procedure Laterality Date   ABDOMINAL HYSTERECTOMY     ANTERIOR AND POSTERIOR REPAIR  04/26/2011   Procedure: ANTERIOR (CYSTOCELE) AND POSTERIOR REPAIR (RECTOCELE);  Surgeon: Reece Packer, MD;  Location: [facility];  Service: Urology;  Laterality: N/A;   BIOPSY  05/25/2020   Procedure: BIOPSY;  Surgeon: Doran Stabler, MD;  Location: [facility];  Service: Gastroenterology;;   CATARACT EXTRACTION Bilateral    with IOL   CHOLECYSTECTOMY  2009   COLONOSCOPY N/A 12/02/2013   three colon polyps removed, small internal hemorrhoids. Hyperplastic polyps   COLONOSCOPY N/A 11/20/2019   pancolonic diverticulosis, two 10-11 mm polyps in ascending colon, one 5 mm polyp in cecum, ascending colon with superficially invasive adenocarcinoma arising in background of sessile serrated polyps with low and high grade cytologic dysplasia.   COLONOSCOPY     COLONOSCOPY W/ POLYPECTOMY     COLONOSCOPY WITH PROPOFOL N/A 05/25/2020   diverticulosis in right colon, redundant colon. Caution warranted on future colonoscopy in light of age, cardiac condition, challenging anatomy.    ESOPHAGOGASTRODUODENOSCOPY (EGD) WITH PROPOFOL N/A 05/25/2020   Grade 1 esophageal varices,  single mucosal nodule in stomach s/p biopsy. (hyperplastic)>    LEFT HEART CATH  09/10/2008   normal coronary arteries, normal LV systolic function, EF 85% (Dr. [name])   [place] Right 1997   under arm   NM MYOCAR PERF WALL MOTION  2010   dipyridamole - mild-mod in intenstiy perfusion defect in mid anterior, mid anteroseptal wall, EF 70%   OVARY SURGERY     bilateral tumors removed   POLYPECTOMY  11/20/2019   Procedure: POLYPECTOMY;  Surgeon: Daneil Dolin, MD;  Location: [facility];  Service: Endoscopy;;  hot and cold snare cecal polyp, and asending polyps x 2   RIGHT/LEFT HEART CATH AND CORONARY ANGIOGRAPHY N/A 10/02/2020   Procedure: RIGHT/LEFT HEART CATH AND CORONARY ANGIOGRAPHY;  Surgeon: Martinique, Peter M, MD;  Location: [facility];  Service: Cardiovascular;  Laterality: N/A;   THYROIDECTOMY     THYROIDECTOMY  02/2008   TRANSTHORACIC ECHOCARDIOGRAM  08/2011   EF=>55%, mild conc LVH; trace MR; mild TR; mild-mod AV calcification with mild valvular AV stenosis   VAGINAL PROLAPSE REPAIR  04/26/2011   Procedure: VAGINAL VAULT SUSPENSION;  Surgeon: Reece Packer, MD;  Location: [facility];  Service: Urology;  Laterality: N/A;  with Graft  10x6   Social History   Socioeconomic History   Marital status:  Married    Spouse name: [name]   Number of children: 1   Years of education: Trade   Highest education level: 12th grade  Occupational History   Occupation: Retired  Tobacco Use   Smoking status: Never   Smokeless tobacco: Never  Vaping Use   Vaping Use: Never used  Substance and Sexual Activity   Alcohol use: No    Comment: socially- none x 30 years   Drug use: No   Sexual activity: Yes  Other Topics Concern   Not on file  Social History Narrative   Patient lives at home with spouse. 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 LIVED IN [place] FOR 50 YRS AND CAME BACK TO [place] ~2009. Caffeine Use: 1 cup of coffee daily. HAD ONE  CHILD: PASSED 5 YRS. HAVE THREE GRAND-KIDS AND TWO GREAT GRANDS.    Social Determinants of Health   Financial Resource Strain: Low Risk    Difficulty of Paying Living Expenses: Not hard at all  Food Insecurity: No Food Insecurity   Worried About Charity fundraiser in the Last Year: Never true   [place] in the Last Year: Never true  Transportation Needs: No Transportation Needs   Lack of Transportation (Medical): No   Lack of Transportation (Non-Medical): No  Physical Activity: Sufficiently Active   Days of Exercise per Week: 5 days   Minutes of Exercise per Session: 30 min  Stress: No Stress Concern Present   Feeling of Stress : Not at all  Social Connections: Moderately Integrated   Frequency of Communication with Friends and Family: More than three times a week   Frequency of Social Gatherings with Friends and Family: Twice a week   Attends Religious Services: More than 4 times per year   Active Member of [employer] or Organizations: No   Attends Archivist Meetings: Never   Marital Status: Married   Outpatient Encounter Medications as of 12/31/2020  Medication Sig   Accu-Chek FastClix Lancets MISC 1 each by Other route 2 (two) times daily.   ALPHAGAN P 0.1 % SOLN Place 1 drop into both eyes 2 (two) times daily.   amLODipine (NORVASC) 10 MG tablet TAKE 1 TABLET(10 MG) BY MOUTH EVERY MORNING (Patient taking differently: Take 10 mg by mouth in the morning.)   Ascorbic Acid (VITAMIN C) 1000 MG tablet Take 1,000 mg by mouth 4 (four) times a week. AT NIGHT   aspirin EC 81 MG tablet Take 81 mg by mouth every other day. In the morning. Swallow whole.   benazepril (LOTENSIN) 40 MG tablet TAKE 1 TABLET(40 MG) BY MOUTH DAILY (Patient taking differently: Take 40 mg by mouth in the morning.)   Blood Glucose Monitoring Suppl (ACCU-CHEK GUIDE) w/Device KIT 1 each by Does not apply route 4 (four) times daily.   cholecalciferol (VITAMIN D3) 25 MCG (1000 UT) tablet Take 1,000 Units by  mouth in the morning.   clotrimazole-betamethasone (LOTRISONE) cream Apply twice daily for 1 week, to affected area , then as needed (Patient taking differently: Apply 1 application topically 2 (two) times daily as needed (itching/skin irritation).)   ezetimibe (ZETIA) 10 MG tablet Take 1 tablet (10 mg total) by mouth daily.   glipiZIDE (GLUCOTROL XL) 5 MG 24 hr tablet Take 1 tablet (5 mg total) by mouth daily with breakfast.   glucose blood (ACCU-CHEK GUIDE) test strip USE TO CHECK BLOOD SUGAR TWICE DAILY   insulin isophane & regular human KwikPen (HUMULIN 70/30  MIX) (70-30) 100 UNIT/ML KwikPen Inject 60 units with breakfast and 40 units with supper only if glucose is above 90   Insulin Pen Needle (B-D ULTRAFINE III SHORT PEN) 31G X 8 MM MISC USE AS DIRECTED TWO TIMES DAILY WITH INSULIN PENS   latanoprost (XALATAN) 0.005 % ophthalmic solution Place 1 drop into both eyes at bedtime.    metFORMIN (GLUCOPHAGE) 500 MG tablet TAKE 1 TABLET BY MOUTH EVERY DAY WITH BREAKFAST   Multiple Vitamin (MULTIVITAMIN WITH MINERALS) TABS tablet Take 1 tablet by mouth daily.   Omega-3 Fatty Acids (FISH OIL) 1200 MG CAPS Take 1,200 mg by mouth every morning.   pantoprazole (PROTONIX) 40 MG tablet Take 1 tablet (40 mg total) by mouth daily. 30 minutes before meal (Patient taking differently: Take 40 mg by mouth daily before breakfast. 30 minutes before meal)   polyethylene glycol (MIRALAX) 17 g packet Take 17 g by mouth at bedtime. (Patient taking differently: Take 17 g by mouth every other day. IN THE MORNING)   pravastatin (PRAVACHOL) 80 MG tablet Take 1 tablet (80 mg total) by mouth in the morning.   triamterene-hydrochlorothiazide (MAXZIDE) 75-50 MG tablet Take 0.5 tablets by mouth daily.   [DISCONTINUED] insulin isophane & regular human (HUMULIN 70/30 MIX) (70-30) 100 UNIT/ML KwikPen Inject 60 Units into the skin 2 (two) times daily with a meal.   No facility-administered encounter medications on file as of  12/31/2020.   ALLERGIES: Allergies  Allergen Reactions   Senokot Wheat Bran [Wheat Bran]     ABDOMINAL CRAMPS   Spironolactone     Stomach problems, vision changes    VACCINATION STATUS: Immunization History  Administered Date(s) Administered   Moderna Sars-Covid-2 Vaccination 09/13/2019, 10/12/2019, 04/29/2020   Pneumococcal Conjugate-13 12/11/2013   Pneumococcal Polysaccharide-23 01/13/2010   Tdap 10/05/2010    Diabetes She presents for her follow-up diabetic visit. She has type 2 diabetes mellitus. Onset time: She was diagnosed at approximate age of 62 years. Her disease course has been improving. There are no hypoglycemic associated symptoms. Pertinent negatives for hypoglycemia include no confusion, headaches, pallor or seizures. Pertinent negatives for diabetes include no blurred vision, no chest pain, no fatigue, no polydipsia, no polyphagia and no polyuria. There are no hypoglycemic complications. Symptoms are improving. Diabetic complications include nephropathy and retinopathy. Risk factors for coronary artery disease include dyslipidemia, diabetes mellitus, obesity and sedentary lifestyle. Her weight is fluctuating minimally. She is following a generally unhealthy diet. When asked about meal planning, she reported none. She has had a previous visit with a dietitian. She rarely participates in exercise. Her home blood glucose trend is decreasing steadily. Her breakfast blood glucose range is generally 140-180 mg/dl. Her bedtime blood glucose range is generally 180-200 mg/dl. Her overall blood glucose range is 180-200 mg/dl. (She presents with still above target glycemic profile both fasting and postprandial.  Her recent A1c was 5 8.4%.  She did not document any hypoglycemia.    ) Eye exam is current.  Hyperlipidemia This is a chronic problem. The current episode started more than 1 year ago. The problem is uncontrolled. Exacerbating diseases include diabetes and obesity. Pertinent  negatives include no chest pain, myalgias or shortness of breath. Current antihyperlipidemic treatment includes statins. Risk factors for coronary artery disease include dyslipidemia, diabetes mellitus, hypertension, obesity, a sedentary lifestyle and post-menopausal.  Hypertension This is a chronic problem. The current episode started more than 1 year ago. Pertinent negatives include no blurred vision, chest pain, headaches, palpitations or shortness of breath.  Risk factors for coronary artery disease include dyslipidemia, diabetes mellitus, obesity and sedentary lifestyle. Hypertensive end-organ damage includes retinopathy.  Nodular goiter She Patient is being seen today for a new issue with her thyroid.  She was found to have 2.6 cm nodule on right lobe of her thyroid while undergoing CT scan of the chest during cancer surveillance.  She has previous history of left hemithyroidectomy approximately 10 years ago for multinodular goiter.  She is not on thyroid hormone supplement or replacement.  She denies dysphagia, shortness of breath, nor voice change. -Her subsequent thyroid ultrasound confirms multinodular right lobe of the thyroid with surgically absent left lobe.  Her right thyroid lobe nodule did not meet criteria for biopsy.  Review of systems  Constitutional: + Minimally fluctuating body weight, current  Body mass index is 31.12 kg/m. , no fatigue, no subjective hyperthermia, no subjective hypothermia    Objective:    BP 128/72   Pulse 64   Ht _0  (1.651 m)   Wt 187 lb (84.8 kg)   BMI 31.12 kg/m   Wt Readings from Last 3 Encounters:  12/31/20 187 lb (84.8 kg)  10/27/20 183 lb 6.4 oz (83.2 kg)  10/23/20 182 lb 9.6 oz (82.8 kg)    Physical Exam- Limited  Constitutional:  Body mass index is 31.12 kg/m. , not in acute distress, normal state of mind    CMP ( most recent) CMP     Component Value Date/Time   NA 139 10/02/2020 1029   NA 131 (L) 09/24/2020 1018   K 3.3 (L)  10/02/2020 1029   CL 89 (L) 09/24/2020 1018   CO2 26 09/24/2020 1018   GLUCOSE 230 (H) 09/24/2020 1018   GLUCOSE 104 (H) 05/05/2020 1029   BUN 16 09/24/2020 1018   CREATININE 1.09 (H) 09/24/2020 1018   CREATININE 1.11 (H) 03/02/2020 0848   CALCIUM 10.8 (H) 09/24/2020 1018   PROT 7.8 08/26/2020 0921   ALBUMIN 4.6 08/26/2020 0921   AST 28 08/26/2020 0921   ALT 27 08/26/2020 0921   ALKPHOS 60 08/26/2020 0921   BILITOT 0.3 08/26/2020 0921   GFRNONAA 57 (L) 07/30/2019 0718   GFRAA 66 07/30/2019 0718    Diabetic Labs (most recent): Lab Results  Component Value Date   HGBA1C 7.8 (A) 12/31/2020   HGBA1C 8.4 (A) 09/08/2020   HGBA1C 8.6 (A) 06/10/2020    Lipid Panel     Component Value Date/Time   CHOL 176 08/26/2020 0921   TRIG 194 (H) 08/26/2020 0921   HDL 41 08/26/2020 0921   CHOLHDL 4.3 08/26/2020 0921   CHOLHDL 4.7 07/30/2019 0718   VLDL 39 12/05/2017 0832   LDLCALC 101 (H) 08/26/2020 0921   LDLCALC 99 07/30/2019 0718   Incidental finding on chest CT on December 05, 2019 Enlarged  multinodular remnant right thyroid with dominant 2.6 cm hypodense Nodule.   Thyroid ultrasound on December 19, 2019: Right lobe 4.4 cm, left lobe absent surgically. No adenopathy   IMPRESSION: Surgical changes of left hemithyroidectomy.  Multinodular thyroid.  3.9 cm and 1.6 cm cystic/almost completely cystic nodules with smooth margins, No thyroid nodule meets criteria for biopsy or surveillance, as designated by the newly established ACR TI-RADS criteria.   Assessment & Plan:     1.  Nodular goiter: Patient with remote past history of left hemithyroidectomy for multinodular goiter.  She did not require thyroid hormone supplement.  She was incidentally found to have 2.6 cm nodule in the right lobe. -Her subsequent  dedicated thyroid ultrasound confirmed 2 nodules on the right lobe of her thyroid which did not meet any criteria for biopsy. -She will not need any antithyroid intervention at  this time, TFTs are consistent with euthyroid state.  She will be considered for repeat ultrasound in 1 -2 year.  2. Type 2 diabetes mellitus with stage 1 chronic kidney disease, with long-term current use of insulin ([place])   She presents with tighter fasting glycemic profile, above target readings postprandially.  Her point-of-care A1c is improved to 7.8% from 8.4%.   She has some rare, random fasting hypoglycemia.  Recent labs reviewed, showing improving renal function.     Her diabetes is complicated by CKD obesity/sedentary life and patient remains at a high risk for more acute and chronic complications of diabetes which include CAD, CVA, CKD, retinopathy, and neuropathy. These are all discussed in detail with the patient.  - I have counseled the patient on diet management and weight loss, by adopting a carbohydrate restricted/protein rich diet.  -She still admits to dietary indiscretions including consumption of sweets and sweetened beverages.  - she acknowledges that there is a room for improvement in her food and drink choices. - Suggestion is made for her to avoid simple carbohydrates  from her diet including Cakes, Sweet Desserts, Ice Cream, Soda (diet and regular), Sweet Tea, Candies, Chips, Cookies, Store Bought Juices, Alcohol in Excess of  1-2 drinks a day, Artificial Sweeteners,  Coffee Creamer, and "Sugar-free" Products, Lemonade. This will help patient to have more stable blood glucose profile and potentially avoid unintended weight gain.   - I encouraged the patient to switch to  unprocessed or minimally processed complex starch and increased protein intake (animal or plant source), fruits, and vegetables.  - Patient is advised to stick to a routine mealtimes to eat 3 meals  a day and avoid unnecessary snacks ( to snack only to correct hypoglycemia).   - I have approached patient with the following individualized plan to manage diabetes and patient agrees:   -In light of her  presentation with tight her fasting glycemic profile, she will be considered for lower dose of insulin at supper.    -Her insurance provider coverage for Humulin 70/30, advised to continue 60 units with breakfast and lowered to 40 units with supper when Premeal blood glucose readings are above 90 mg per DL.   -She is encouraged to start monitoring blood glucose 4 times a day-before meals and at bedtime .  -Patient is encouraged to call clinic for blood glucose levels less than 70 or above 200 mg /dl. -She is advised to to continue glipizide 5 mg XL p.o. daily at breakfast, and metformin 500 mg p.o. daily at breakfast.   I prescribed a new meter and testing supplies for her.   - Patient specific target  A1c;  LDL, HDL, Triglycerides, were discussed in detail.  3) BP/HTN:  -Her blood pressure is controlled to target.   She is advised to continue her current blood pressure medications including benazepril 40 mg p.o. daily.   4) Lipids/HPL: Her recent lipid panel showed uncontrolled LDL at 97.  She is advised to continue pravastatin 80 mg p.o. nightly.   Side effects and precautions discussed with her.           5)  Weight/Diet: Her BMI is 30.5-complicating her diabetes care.- she is a candidate for moderate weight loss.  CDE Consult has been initiated , exercise, and detailed carbohydrates information provided.  6) Chronic  Care/Health Maintenance:  -Patient is on ACEI/ARB and Statin medications and encouraged to continue to follow up with Ophthalmology, Podiatrist at least yearly or according to recommendations, and advised to  stay away from smoking. I have recommended yearly flu vaccine and pneumonia vaccination at least every 5 years; moderate intensity exercise for up to 150 minutes weekly; and  sleep for at least 7 hours a day.  POC ABI for PAD screen was normal on June 10, 2020.  This test will be repeated in January 2027, or sooner if needed.     - I advised patient to maintain  close follow up with Fayrene Helper, MD for primary care needs.   I spent 35 minutes in the care of the patient today including review of labs from [facility], Lipids, Thyroid Function, Hematology (current and previous including abstractions from other facilities); face-to-face time discussing  her blood glucose readings/logs, discussing hypoglycemia and hyperglycemia episodes and symptoms, medications doses, her options of short and long term treatment based on the latest standards of care / guidelines;  discussion about incorporating lifestyle medicine;  and documenting the encounter.    Please refer to Patient Instructions for Blood Glucose Monitoring and Insulin/Medications Dosing Guide"  in media tab for additional information. Please  also refer to " Patient Self Inventory" in the Media  tab for reviewed elements of pertinent patient history.  Derinda Late Cataldo participated in the discussions, expressed understanding, and voiced agreement with the above plans.  All questions were answered to her satisfaction. she is encouraged to contact clinic should she have any questions or concerns prior to her return visit.    Follow up plan: - Return in about 4 months (around 05/02/2021) for F/U with Pre-visit Labs, Meter, Logs, A1c here.Glade Lloyd, MD Phone: [phone]  Fax: [phone]   This note was partially dictated with voice recognition software. Similar sounding words can be transcribed inadequately or may not  be corrected upon review.  12/31/2020, 10:27 AM

## 2020-12-31 NOTE — Patient Instructions (Signed)

## 2021-01-06 DIAGNOSIS — Z794 Long term (current) use of insulin: Secondary | ICD-10-CM | POA: Diagnosis not present

## 2021-01-06 DIAGNOSIS — E1122 Type 2 diabetes mellitus with diabetic chronic kidney disease: Secondary | ICD-10-CM | POA: Diagnosis not present

## 2021-01-06 DIAGNOSIS — N181 Chronic kidney disease, stage 1: Secondary | ICD-10-CM | POA: Diagnosis not present

## 2021-01-07 LAB — COMPREHENSIVE METABOLIC PANEL
ALT: 24 IU/L (ref 0–32)
AST: 24 IU/L (ref 0–40)
Albumin/Globulin Ratio: 1.7 (ref 1.2–2.2)
Albumin: 4.3 g/dL (ref 3.7–4.7)
Alkaline Phosphatase: 51 IU/L (ref 44–121)
BUN/Creatinine Ratio: 16 (ref 12–28)
BUN: 19 mg/dL (ref 8–27)
Bilirubin Total: 0.3 mg/dL (ref 0.0–1.2)
CO2: 24 mmol/L (ref 20–29)
Calcium: 10.3 mg/dL (ref 8.7–10.3)
Chloride: 100 mmol/L (ref 96–106)
Creatinine, Ser: 1.16 mg/dL — ABNORMAL HIGH (ref 0.57–1.00)
Globulin, Total: 2.6 g/dL (ref 1.5–4.5)
Glucose: 155 mg/dL — ABNORMAL HIGH (ref 65–99)
Potassium: 4.6 mmol/L (ref 3.5–5.2)
Sodium: 137 mmol/L (ref 134–144)
Total Protein: 6.9 g/dL (ref 6.0–8.5)
eGFR: 48 mL/min/{1.73_m2} — ABNORMAL LOW (ref >59)

## 2021-01-07 LAB — T4, FREE: Free T4: 1.29 ng/dL (ref 0.82–1.77)

## 2021-01-07 LAB — LIPID PANEL
Chol/HDL Ratio: 4.2 ratio (ref 0.0–4.4)
Cholesterol, Total: 161 mg/dL (ref 100–199)
HDL: 38 mg/dL — ABNORMAL LOW (ref >39)
LDL Chol Calc (NIH): 94 mg/dL (ref 0–99)
Triglycerides: 167 mg/dL — ABNORMAL HIGH (ref 0–149)
VLDL Cholesterol Cal: 29 mg/dL (ref 5–40)

## 2021-01-07 LAB — TSH: TSH: 2.95 u[IU]/mL (ref 0.450–4.500)

## 2021-01-12 ENCOUNTER — Encounter: Payer: Self-pay | Admitting: Family Medicine

## 2021-01-12 ENCOUNTER — Other Ambulatory Visit: Payer: Self-pay

## 2021-01-12 ENCOUNTER — Ambulatory Visit (INDEPENDENT_AMBULATORY_CARE_PROVIDER_SITE_OTHER): Payer: Medicare Other | Admitting: Family Medicine

## 2021-01-12 VITALS — BP 139/67 | HR 74 | Temp 98.7°F | Resp 18 | Ht 65.0 in | Wt 188.0 lb

## 2021-01-12 DIAGNOSIS — Z794 Long term (current) use of insulin: Secondary | ICD-10-CM | POA: Diagnosis not present

## 2021-01-12 DIAGNOSIS — I200 Unstable angina: Secondary | ICD-10-CM | POA: Diagnosis not present

## 2021-01-12 DIAGNOSIS — I10 Essential (primary) hypertension: Secondary | ICD-10-CM | POA: Diagnosis not present

## 2021-01-12 DIAGNOSIS — E1122 Type 2 diabetes mellitus with diabetic chronic kidney disease: Secondary | ICD-10-CM

## 2021-01-12 DIAGNOSIS — E782 Mixed hyperlipidemia: Secondary | ICD-10-CM | POA: Diagnosis not present

## 2021-01-12 DIAGNOSIS — E663 Overweight: Secondary | ICD-10-CM

## 2021-01-12 DIAGNOSIS — N181 Chronic kidney disease, stage 1: Secondary | ICD-10-CM | POA: Diagnosis not present

## 2021-01-12 MED ORDER — PANTOPRAZOLE SODIUM 40 MG PO TBEC
40.0000 mg | DELAYED_RELEASE_TABLET | Freq: Every day | ORAL | 1 refills | Status: DC
Start: 2021-01-12 — End: 2021-10-18

## 2021-01-12 NOTE — Patient Instructions (Addendum)
F/U in early to mid October, call if you need me before  Nurse Please send for eye exam by Dr Gershon Crane in 2022 and enter the result  Blood pressure and TG are slightly high, need to exercise and work on weight loss and low salt diet to improve  Please get shingles vaccines  Call us and come for flu vaccine when you decide to get this  It is important that you exercise regularly at least 30 minutes 5 times a week. If you develop chest pain, have severe difficulty breathing, or feel very tired, stop exercising immediately and seek medical attention   You are referred to diabetic educator due to uncontrolled blood sugar and knowledge deficit  Thanks for choosing [facility], we consider it a privelige to serve you.

## 2021-01-12 NOTE — Assessment & Plan Note (Signed)
DASH diet and commitment to daily physical activity for a minimum of 30 minutes discussed and encouraged, as a part of hypertension management. The importance of attaining a healthy weight is also discussed.  BP/Weight 01/12/2021 12/31/2020 10/27/2020 10/23/2020 10/02/2020 09/24/2020 [id number]  Systolic BP [id number] 0000000 123456 123456 [id number]  Diastolic BP 67 72 71 70 70 72 68  Wt. (Lbs) 188.04 187 183.4 182.6 184 184 184.2  BMI 31.29 31.12 30.52 30.39 30.62 30.62 30.65   Needs to improve with lifestyle chnage

## 2021-01-12 NOTE — Progress Notes (Signed)
Joanna Brown     MRN: [id number]      DOB: 1941/05/08   HPI Joanna Brown is here for follow up and re-evaluation of chronic medical conditions, medication management and review of any available recent lab and radiology data.  Preventive health is updated, specifically  Cancer screening and Immunization.   Questions or concerns regarding consultations or procedures which the PT has had in the interim are  addressed. The PT denies any adverse reactions to current medications since the last visit.  Blood sugar improved, but still not at goal Denies polyuria, polydipsia, blurred vision , or hypoglycemic episodes. No regular exercise and gaining weight will work on both  ROS Denies recent fever or chills. Denies sinus pressure, nasal congestion, ear pain or sore throat. Denies chest congestion, productive cough or wheezing. Denies chest pains, palpitations and leg swelling Denies abdominal pain, nausea, vomiting,diarrhea or constipation.   Denies dysuria, frequency, hesitancy or incontinence. Denies joint pain, swelling and limitation in mobility. Denies headaches, seizures, numbness, or tingling. Denies depression, anxiety or insomnia. Denies skin break down or rash.   PE  BP 139/67   Pulse 74   Temp 98.7 F (37.1 C)   Resp 18   Ht '5\' 5"'$  (1.651 m)   Wt 188 lb 0.6 oz (85.3 kg)   SpO2 96%   BMI 31.29 kg/m   Patient alert and oriented and in no cardiopulmonary distress.  HEENT: No facial asymmetry, EOMI,     Neck supple .  Chest: Clear to auscultation bilaterally.  CVS: S1, S2 systolic  murmur, no S3.Regular rate.  ABD: Soft non tender.   Ext: No edema  MS: Adequate ROM spine, shoulders, hips and knees.  Skin: Intact, no ulcerations or rash noted.  Psych: Good eye contact, normal affect. Memory intact not anxious or depressed appearing.  CNS: CN 2-12 intact, power,  normal throughout.no focal deficits noted.   Assessment & Plan  Essential hypertension DASH  diet and commitment to daily physical activity for a minimum of 30 minutes discussed and encouraged, as a part of hypertension management. The importance of attaining a healthy weight is also discussed.  BP/Weight 01/12/2021 12/31/2020 10/27/2020 10/23/2020 10/02/2020 09/24/2020 [id number]  Systolic BP [id number] 0000000 123456 123456 [id number]  Diastolic BP 67 72 71 70 70 72 68  Wt. (Lbs) 188.04 187 183.4 182.6 184 184 184.2  BMI 31.29 31.12 30.52 30.39 30.62 30.62 30.65   Needs to improve with lifestyle chnage    Mixed hyperlipidemia Hyperlipidemia:Low fat diet discussed and encouraged.   Lipid Panel  Lab Results  Component Value Date   CHOL 161 01/06/2021   HDL 38 (L) 01/06/2021   LDLCALC 94 01/06/2021   TRIG 167 (H) 01/06/2021   CHOLHDL 4.2 01/06/2021   Uncontrolled needs to lower fat intake, no med change    Overweight (BMI 25.0-29.9)  Patient re-educated about  the importance of commitment to a  minimum of 150 minutes of exercise per week as able.  The importance of healthy food choices with portion control discussed, as well as eating regularly and within a 12 hour window most days. The need to choose "clean , green" food 50 to 75% of the time is discussed, as well as to make water the primary drink and set a goal of 64 ounces water daily.    Weight /BMI 01/12/2021 12/31/2020 10/27/2020  WEIGHT 188 lb 0.6 oz 187 lb 183 lb 6.4 oz  HEIGHT '5\' 5"'$  '5\' 5"'$  '5\' 5"'$   BMI 31.29 kg/m2 31.12 kg/m2 30.52 kg/m2

## 2021-01-12 NOTE — Assessment & Plan Note (Signed)
Hyperlipidemia:Low fat diet discussed and encouraged.   Lipid Panel  Lab Results  Component Value Date   CHOL 161 01/06/2021   HDL 38 (L) 01/06/2021   LDLCALC 94 01/06/2021   TRIG 167 (H) 01/06/2021   CHOLHDL 4.2 01/06/2021   Uncontrolled needs to lower fat intake, no med change

## 2021-01-12 NOTE — Assessment & Plan Note (Signed)
  Patient re-educated about  the importance of commitment to a  minimum of 150 minutes of exercise per week as able.  The importance of healthy food choices with portion control discussed, as well as eating regularly and within a 12 hour window most days. The need to choose "clean , green" food 50 to 75% of the time is discussed, as well as to make water the primary drink and set a goal of 64 ounces water daily.    Weight /BMI 01/12/2021 12/31/2020 10/27/2020  WEIGHT 188 lb 0.6 oz 187 lb 183 lb 6.4 oz  HEIGHT '5\' 5"'$  '5\' 5"'$  '5\' 5"'$   BMI 31.29 kg/m2 31.12 kg/m2 30.52 kg/m2

## 2021-01-20 ENCOUNTER — Other Ambulatory Visit: Payer: Self-pay

## 2021-01-20 ENCOUNTER — Ambulatory Visit (INDEPENDENT_AMBULATORY_CARE_PROVIDER_SITE_OTHER): Payer: Medicare Other | Admitting: Cardiovascular Disease

## 2021-01-20 ENCOUNTER — Ambulatory Visit (HOSPITAL_COMMUNITY): Payer: Medicare Other | Attending: Cardiovascular Disease

## 2021-01-20 ENCOUNTER — Encounter: Payer: Self-pay | Admitting: Cardiovascular Disease

## 2021-01-20 VITALS — BP 138/70 | HR 78 | Ht 65.0 in | Wt 184.0 lb

## 2021-01-20 DIAGNOSIS — I350 Nonrheumatic aortic (valve) stenosis: Secondary | ICD-10-CM

## 2021-01-20 DIAGNOSIS — I200 Unstable angina: Secondary | ICD-10-CM

## 2021-01-20 LAB — ECHOCARDIOGRAM COMPLETE
AR max vel: 0.8 cm2
AV Area VTI: 0.83 cm2
AV Area mean vel: 0.79 cm2
AV Mean grad: 25 mmHg
AV Peak grad: 43.6 mmHg
Ao pk vel: 3.3 m/s
Area-P 1/2: 2.66 cm2
S' Lateral: 3 cm

## 2021-01-20 LAB — BASIC METABOLIC PANEL
BUN/Creatinine Ratio: 11 — ABNORMAL LOW (ref 12–28)
BUN: 12 mg/dL (ref 8–27)
CO2: 24 mmol/L (ref 20–29)
Calcium: 10.4 mg/dL — ABNORMAL HIGH (ref 8.7–10.3)
Chloride: 98 mmol/L (ref 96–106)
Creatinine, Ser: 1.09 mg/dL — ABNORMAL HIGH (ref 0.57–1.00)
Glucose: 74 mg/dL (ref 65–99)
Potassium: 3.9 mmol/L (ref 3.5–5.2)
Sodium: 138 mmol/L (ref 134–144)
eGFR: 52 mL/min/{1.73_m2} — ABNORMAL LOW (ref >59)

## 2021-01-20 NOTE — Progress Notes (Signed)
[facility] Consult Note  Chief Complaint  Patient presents with   New Patient (Initial Visit)    Aortic stenosis   History of Present Illness: 80 yo female with history of diabetes, GERD, mild CAD, hyperlipidemia, HTN and aortic stenosis who is here today as a new consult, referred by Dr. [name], for further discussion regarding her aortic stenosis and possible TAVR. Echo in January 2022 with LVEF=65-70%, mild LVH. Trivial MR. The aortic valve leaflets are thickened and calcified with limited leaflet excursion. Mean gradient 32.5 mmHg, peak gradient 51.7 mmHg, AVA 0.77 cm2, dimensionless index 0.30, SVI 33. This is consistent with moderately severe aortic stenosis with possible paradoxical low flow/low gradient aortic stenosis. Cardiac cath May 2022 with mild CAD, normal cardiac output, normal right and left heart pressures. Mean gradient across the aortic valve 26.6 mmHg by cath.Echo today with LVEF=55-60%. Moderate to severe aortic stenosis with mean gradient 25 mmHg, peak gradient 43.6 mmHg, AVA 0.83 cm2, dimensionless index 0.26. SVI 30. This is consistent with borderline severe AS.   She tells me today that she has some fatigue with heavy exertion and occasional dyspnea on exertion. Overall she feels well. No chest pain. No dizziness or LE edema. She lives in [place], [place] with her husband. She has partial dentures and is having some issues with her teeth. She is seeing her dentist next month. She is retired from the post [employer].   Primary Care Physician: Fayrene Helper, MD Primary Cardiologist: [facility] Referring Cardiologist: [name]  Past Medical History:  Diagnosis Date   Arthritis    OA   Blood transfusion    1980   Diabetes mellitus    Dysrhythmia    Female bladder prolapse    GERD 11/18/2008   Qualifier: Diagnosis of  By: Craige Cotta     GERD (gastroesophageal reflux disease)    Glaucoma    Heart disease    Hyperlipidemia    Hypertension    echo and stress  4/10 reports on chart, EKG ` LOV 9/12 on chart   Mild aortic stenosis    Thyroid disease     Past Surgical History:  Procedure Laterality Date   ABDOMINAL HYSTERECTOMY     ANTERIOR AND POSTERIOR REPAIR  04/26/2011   Procedure: ANTERIOR (CYSTOCELE) AND POSTERIOR REPAIR (RECTOCELE);  Surgeon: Reece Packer, MD;  Location: [facility];  Service: Urology;  Laterality: N/A;   BIOPSY  05/25/2020   Procedure: BIOPSY;  Surgeon: Doran Stabler, MD;  Location: [facility];  Service: Gastroenterology;;   CATARACT EXTRACTION Bilateral    with IOL   CHOLECYSTECTOMY  2009   COLONOSCOPY N/A 12/02/2013   three colon polyps removed, small internal hemorrhoids. Hyperplastic polyps   COLONOSCOPY N/A 11/20/2019   pancolonic diverticulosis, two 10-11 mm polyps in ascending colon, one 5 mm polyp in cecum, ascending colon with superficially invasive adenocarcinoma arising in background of sessile serrated polyps with low and high grade cytologic dysplasia.   COLONOSCOPY     COLONOSCOPY W/ POLYPECTOMY     COLONOSCOPY WITH PROPOFOL N/A 05/25/2020   diverticulosis in right colon, redundant colon. Caution warranted on future colonoscopy in light of age, cardiac condition, challenging anatomy.    ESOPHAGOGASTRODUODENOSCOPY (EGD) WITH PROPOFOL N/A 05/25/2020   Grade 1 esophageal varices, single mucosal nodule in stomach s/p biopsy. (hyperplastic)>    LEFT HEART CATH  09/10/2008   normal coronary arteries, normal LV systolic function, EF 48% (Dr. [name])   [place] Right 1997  under arm   NM MYOCAR PERF WALL MOTION  2010   dipyridamole - mild-mod in intenstiy perfusion defect in mid anterior, mid anteroseptal wall, EF 70%   OVARY SURGERY     bilateral tumors removed   POLYPECTOMY  11/20/2019   Procedure: POLYPECTOMY;  Surgeon: Daneil Dolin, MD;  [facility];  Service: Endoscopy;;  hot and cold snare cecal polyp, and asending polyps x 2   RIGHT/LEFT HEART CATH AND CORONARY  ANGIOGRAPHY N/A 10/02/2020   Procedure: RIGHT/LEFT HEART CATH AND CORONARY ANGIOGRAPHY;  Surgeon: Martinique, Peter M, MD;  Location: [facility];  Service: Cardiovascular;  Laterality: N/A;   THYROIDECTOMY     THYROIDECTOMY  02/2008   TRANSTHORACIC ECHOCARDIOGRAM  08/2011   EF=>55%, mild conc LVH; trace MR; mild TR; mild-mod AV calcification with mild valvular AV stenosis   VAGINAL PROLAPSE REPAIR  04/26/2011   Procedure: VAGINAL VAULT SUSPENSION;  Surgeon: Reece Packer, MD;  Location: [facility];  Service: Urology;  Laterality: N/A;  with Graft  10x6    Current Outpatient Medications  Medication Sig Dispense Refill   Accu-Chek FastClix Lancets MISC 1 each by Other route 2 (two) times daily. 102 each 2   ALPHAGAN P 0.1 % SOLN Place 1 drop into both eyes 2 (two) times daily.     amLODipine (NORVASC) 10 MG tablet TAKE 1 TABLET(10 MG) BY MOUTH EVERY MORNING 90 tablet 3   Ascorbic Acid (VITAMIN C) 1000 MG tablet Take 1,000 mg by mouth 4 (four) times a week. AT NIGHT     aspirin EC 81 MG tablet Take 81 mg by mouth every other day. In the morning. Swallow whole.     benazepril (LOTENSIN) 40 MG tablet TAKE 1 TABLET(40 MG) BY MOUTH DAILY 90 tablet 1   Blood Glucose Monitoring Suppl (ACCU-CHEK GUIDE) w/Device KIT 1 each by Does not apply route 4 (four) times daily. 1 kit 0   cholecalciferol (VITAMIN D3) 25 MCG (1000 UT) tablet Take 1,000 Units by mouth in the morning.     clotrimazole-betamethasone (LOTRISONE) cream Apply twice daily for 1 week, to affected area , then as needed (Patient taking differently: Apply 1 application topically 2 (two) times daily as needed (itching/skin irritation).) 45 g 0   ezetimibe (ZETIA) 10 MG tablet Take 1 tablet (10 mg total) by mouth daily. 90 tablet 3   glipiZIDE (GLUCOTROL XL) 5 MG 24 hr tablet Take 1 tablet (5 mg total) by mouth daily with breakfast. 90 tablet 1   glucose blood (ACCU-CHEK GUIDE) test strip USE TO CHECK BLOOD SUGAR TWICE DAILY 150 strip 1    insulin isophane & regular human KwikPen (HUMULIN 70/30 MIX) (70-30) 100 UNIT/ML KwikPen Inject 60 units with breakfast and 40 units with supper only if glucose is above 90 60 mL 3   Insulin Pen Needle (B-D ULTRAFINE III SHORT PEN) 31G X 8 MM MISC USE AS DIRECTED TWO TIMES DAILY WITH INSULIN PENS 100 each 5   latanoprost (XALATAN) 0.005 % ophthalmic solution Place 1 drop into both eyes at bedtime.      metFORMIN (GLUCOPHAGE) 500 MG tablet TAKE 1 TABLET BY MOUTH EVERY DAY WITH BREAKFAST 60 tablet 2   Multiple Vitamin (MULTIVITAMIN WITH MINERALS) TABS tablet Take 1 tablet by mouth daily.     Omega-3 Fatty Acids (FISH OIL) 1200 MG CAPS Take 1,200 mg by mouth every morning.     pantoprazole (PROTONIX) 40 MG tablet Take 1 tablet (40 mg total) by mouth  daily. 30 minutes before meal 90 tablet 1   polyethylene glycol (MIRALAX) 17 g packet Take 17 g by mouth at bedtime. (Patient taking differently: Take 17 g by mouth every other day. IN THE MORNING) 14 each 0   pravastatin (PRAVACHOL) 80 MG tablet Take 1 tablet (80 mg total) by mouth in the morning. 90 tablet 3   triamterene-hydrochlorothiazide (MAXZIDE) 75-50 MG tablet Take 0.5 tablets by mouth daily. 45 tablet 0   No current facility-administered medications for this visit.    Allergies  Allergen Reactions   Senokot Wheat Bran [Wheat Bran]     ABDOMINAL CRAMPS   Spironolactone     Stomach problems, vision changes     Social History   Socioeconomic History   Marital status: Married    Spouse name: [name]   Number of children: 1   Years of education: Trade   Highest education level: 12th grade  Occupational History   Occupation: Retired   Occupation: Writer  Tobacco Use   Smoking status: Never   Smokeless tobacco: Never  Scientific laboratory technician Use: Never used  Substance and Sexual Activity   Alcohol use: No    Comment: socially- none x 30 years   Drug use: No   Sexual activity: Yes  Other Topics Concern   Not on file   Social History Narrative   Patient lives at home with spouse. 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 LIVED IN [place] FOR 50 YRS AND CAME BACK TO [place] ~2009. Caffeine Use: 1 cup of coffee daily. HAD ONE CHILD: PASSED 5 YRS. HAVE THREE GRAND-KIDS AND TWO GREAT GRANDS.    Social Determinants of Health   Financial Resource Strain: Low Risk    Difficulty of Paying Living Expenses: Not hard at all  Food Insecurity: No Food Insecurity   Worried About Charity fundraiser in the Last Year: Never true   [place] in the Last Year: Never true  Transportation Needs: No Transportation Needs   Lack of Transportation (Medical): No   Lack of Transportation (Non-Medical): No  Physical Activity: Sufficiently Active   Days of Exercise per Week: 5 days   Minutes of Exercise per Session: 30 min  Stress: No Stress Concern Present   Feeling of Stress : Not at all  Social Connections: Moderately Integrated   Frequency of Communication with Friends and Family: More than three times a week   Frequency of Social Gatherings with Friends and Family: Twice a week   Attends Religious Services: More than 4 times per year   Active Member of [employer] or Organizations: No   Attends Music therapist: Never   Marital Status: Married  Human resources officer Violence: Not At Risk   Fear of Current or Ex-Partner: No   Emotionally Abused: No   Physically Abused: No   Sexually Abused: No    Family History  Problem Relation Age of Onset   Stomach cancer Mother 56   Heart disease Mother 11       heart disease   Heart disease Father 80       MI   Stroke Maternal Grandfather    Heart attack Paternal Grandfather    Hypertension Brother    Bone cancer Brother    Hypertension Sister    Liver cancer Sister    Hypertension Sister    Hypertension Child    Colon cancer Neg Hx    Colon polyps Neg Hx  Pancreatic cancer Neg Hx    Esophageal cancer Neg Hx    Rectal cancer Neg Hx      Review of Systems:  As stated in the HPI and otherwise negative.   BP 138/70   Pulse 78   Ht '5\' 5"'  (1.651 m)   Wt 184 lb (83.5 kg)   SpO2 99%   BMI 30.62 kg/m   Physical Examination: General: Well developed, well nourished, NAD  HEENT: OP clear, mucus membranes moist  SKIN: warm, dry. No rashes. Neuro: No focal deficits  Musculoskeletal: Muscle strength 5/5 all ext  Psychiatric: Mood and affect normal  Neck: No JVD, no carotid bruits, no thyromegaly, no lymphadenopathy.  Lungs:Clear bilaterally, no wheezes, rhonci, crackles Cardiovascular: Regular rate and rhythm. Loud, harsh, late peaking systolic murmur.  Abdomen:Soft. Bowel sounds present. Non-tender.  Extremities: No lower extremity edema. Pulses are 2 + in the bilateral DP/PT.  EKG:  EKG is not ordered today. The ekg from 09/24/20 is reviewed today and shows NSR, LVH    Echo 9/722 (today):   1. Left ventricular ejection fraction, by estimation, is 55 to 60%. The  left ventricle has normal function. The left ventricle has no regional  wall motion abnormalities. Left ventricular diastolic parameters are  consistent with Grade I diastolic  dysfunction (impaired relaxation). The average left ventricular global  longitudinal strain is -16.1 %. The global longitudinal strain is  borderline reduced.   2. Right ventricular systolic function is normal. The right ventricular  size is normal. The estimated right ventricular systolic pressure is 16.1  mmHg.   3. Left atrial size was mildly dilated.   4. The mitral valve is degenerative. Mild mitral valve regurgitation.  Moderate to severe mitral annular calcification.   5. The aortic valve is tricuspid. There is moderate calcification of the  aortic valve. There is severe thickening of the aortic valve. Aortic valve  regurgitation is not visualized. Moderate to severe aortic valve stenosis.   FINDINGS   Left Ventricle: Left ventricular ejection fraction, by estimation, is  55  to 60%. The left ventricle has normal function. The left ventricle has no  regional wall motion abnormalities. The average left ventricular global  longitudinal strain is -16.1 %.  The global longitudinal strain is abnormal. The left ventricular internal  cavity size was normal in size. There is borderline concentric left  ventricular hypertrophy. Left ventricular diastolic parameters are  consistent with Grade I diastolic dysfunction   (impaired relaxation). Indeterminate filling pressures.   Right Ventricle: The right ventricular size is normal. No increase in  right ventricular wall thickness. Right ventricular systolic function is  normal. The tricuspid regurgitant velocity is 1.52 m/s, and with an  assumed right atrial pressure of 3 mmHg,  the estimated right ventricular systolic pressure is 09.6 mmHg.   Left Atrium: Left atrial size was mildly dilated.   Right Atrium: Right atrial size was normal in size.   Pericardium: There is no evidence of pericardial effusion.   Mitral Valve: The mitral valve is degenerative in appearance. Moderate to  severe mitral annular calcification. Mild mitral valve regurgitation, with  centrally-directed jet.   Tricuspid Valve: The tricuspid valve is normal in structure. Tricuspid  valve regurgitation is trivial.   Aortic Valve: The aortic valve is tricuspid. There is moderate  calcification of the aortic valve. There is severe thickening of the  aortic valve. Aortic valve regurgitation is not visualized. Moderate to  severe aortic stenosis is present. Aortic valve  mean gradient  measures 25.0 mmHg. Aortic valve peak gradient measures 43.6  mmHg. Aortic valve area, by VTI measures 0.83 cm.   Pulmonic Valve: The pulmonic valve was grossly normal. Pulmonic valve  regurgitation is not visualized.   Aorta: The aortic root is normal in size and structure.   IAS/Shunts: No atrial level shunt detected by color flow Doppler.      LEFT  VENTRICLE  PLAX 2D  LVIDd:         4.10 cm  Diastology  LVIDs:         3.00 cm  LV e' medial:    5.44 cm/s  LV PW:         1.10 cm  LV E/e' medial:  15.7  LV IVS:        1.20 cm  LV e' lateral:   8.49 cm/s  LVOT diam:     2.00 cm  LV E/e' lateral: 10.1  LV SV:         57  LV SV Index:   30       2D Longitudinal Strain  LVOT Area:     3.13 cm 2D Strain GLS Avg:     -16.1 %      RIGHT VENTRICLE  RV Basal diam:  3.00 cm  RV S prime:     11.60 cm/s  TAPSE (M-mode): 1.9 cm   LEFT ATRIUM             Index       RIGHT ATRIUM           Index  LA diam:        3.70 cm 1.92 cm/m  RA Area:     11.00 cm  LA Vol (A2C):   58.6 ml 30.41 ml/m RA Volume:   24.10 ml  12.51 ml/m  LA Vol (A4C):   49.0 ml 25.43 ml/m  LA Biplane Vol: 54.4 ml 28.23 ml/m   AORTIC VALVE  AV Area (Vmax):    0.80 cm  AV Area (Vmean):   0.79 cm  AV Area (VTI):     0.83 cm  AV Vmax:           330.00 cm/s  AV Vmean:          229.000 cm/s  AV VTI:            0.693 m  AV Peak Grad:      43.6 mmHg  AV Mean Grad:      25.0 mmHg  LVOT Vmax:         84.90 cm/s  LVOT Vmean:        58.200 cm/s  LVOT VTI:          0.183 m  LVOT/AV VTI ratio: 0.26     AORTA  Ao Root diam: 3.00 cm   MITRAL VALVE                TRICUSPID VALVE  MV Area (PHT):              TR Peak grad:   9.2 mmHg  MV Decel Time:              TR Vmax:        152.00 cm/s  MV E velocity: 85.40 cm/s  MV A velocity: 148.00 cm/s  SHUNTS  MV E/A ratio:  0.58         Systemic VTI:  0.18 m  Systemic Diam: 2.00 cm   Cardiac cath 10/02/20: Prox LAD to Mid LAD lesion is 30% stenosed. Ost Cx to Mid Cx lesion is 20% stenosed. Prox RCA lesion is 25% stenosed. LV end diastolic pressure is normal. There is moderate aortic valve stenosis.   1. Mild nonobstructive CAD 2. Moderate to severe aortic stenosis AV mean gradient 27 mm Hg, peak gradient 30 mm Hg 3. Severe mitral annular calcification 4. Normal right heart and LV filling  pressures.  5. Normal cardiac output.    Plan: patient should be considered for TAVR. As valve is not critical this could await resolution of our contrast shortage.    Recommendations  Antiplatelet/Anticoag Recommend Aspirin 57m daily for moderate CAD.   Indications  Nonrheumatic aortic valve stenosis [I35.0 (ICD-10-CM)]   Procedural Details  Technical Details Indication: 80yo BF with moderate to severe AS and chest pain  Procedural Details: The right groin was prepped, draped, and anesthetized with 1% lidocaine. Ultrasound was used to guide access. Image was obtained and stored in the record.  Using the modified Seldinger technique a 5 Fr  sheath was placed in the right femoral artery and a 7 French sheath was placed in the right femoral vein. A Swan-Ganz catheter was used for the right heart catheterization. Standard protocol was followed for recording of right heart pressures and sampling of oxygen saturations. Fick cardiac output was calculated. Standard Judkins catheters were used for selective coronary angiography and left ventricular pressures. A LA catheter and straight wire were used to cross the AV. There were no immediate procedural complications. Femoral hemostasis was obtained with a Mynx closure device. The patient was transferred to the post catheterization recovery area for further monitoring.  Contrast: 45 cc Estimated blood loss <50 mL.   During this procedure medications were administered to achieve and maintain moderate conscious sedation while the patient's heart rate, blood pressure, and oxygen saturation were continuously monitored and I was present face-to-face 100% of this time.   Medications (Filter: Administrations occurring from 0945 to 1054 on 10/02/20)  important  Continuous medications are totaled by the amount administered until 10/02/20 1054.   Heparin (Porcine) in NaCl 1000-0.9 UT/500ML-% SOLN (mL) Total volume:  1,000 mL Date/Time Rate/Dose/Volume  Action   10/02/20 1008 500 mL Given   1009 500 mL Given    midazolam (VERSED) injection (mg) Total dose:  2 mg Date/Time Rate/Dose/Volume Action   10/02/20 1009 2 mg Given    fentaNYL (SUBLIMAZE) injection (mcg) Total dose:  25 mcg Date/Time Rate/Dose/Volume Action   10/02/20 1010 25 mcg Given    lidocaine (PF) (XYLOCAINE) 1 % injection (mL) Total volume:  20 mL Date/Time Rate/Dose/Volume Action   10/02/20 1018 20 mL Given    iohexol (OMNIPAQUE) 350 MG/ML injection (mL) Total volume:  45 mL Date/Time Rate/Dose/Volume Action   10/02/20 1048 45 mL Given    Sedation Time  Sedation Time Physician-1: 31 minutes 42 seconds Contrast  Medication Name Total Dose  iohexol (OMNIPAQUE) 350 MG/ML injection 45 mL   Radiation/Fluoro  Fluoro time: 6.1 (min) DAP: 13614 (mGycm2) Cumulative Air Kerma: 2818(mGy) Complications  Complications documented before study signed (10/02/2020 156:31AM)   No complications were associated with this study.  Documented by JMartinique Peter M, MD - 10/02/2020 10:55 AM     Coronary Findings  Diagnostic Dominance: Right Left Main  Vessel was injected. Vessel is normal in caliber. Vessel is angiographically normal.  Left Anterior Descending  Prox LAD to Mid LAD lesion is  30% stenosed. The lesion is moderately calcified.  Ramus Intermedius  Vessel was injected. Vessel is normal in caliber. Vessel is angiographically normal.  Left Circumflex  Ost Cx to Mid Cx lesion is 20% stenosed.  Right Coronary Artery  Prox RCA lesion is 25% stenosed. The lesion is moderately calcified.  Intervention  No interventions have been documented. Left Heart  Left Ventricle LV end diastolic pressure is normal.  Mitral Valve The annulus is calcified.  Aortic Valve There is moderate aortic valve stenosis. The aortic valve is calcified. There is abnormal aortic valve motion.   Coronary Diagrams  Diagnostic Dominance: Right Intervention  Implants     Vascular  Products  Closure Mynx Control 70f-- [id number]- Implanted Inventory item: CLOSURE [facility] PacificCONTROL 65F Model/Cat number: [id number] Manufacturer: CDanaLot number: [id number] Device identifier: [id number] identifier type: GS1  GUDID Information  Request status Successful    Brand name: MYNX CONTROL Version/Model: [id number] Company name: [employer]ew Hopesafety info as of 10/02/20: MR Safe  Contains dry or latex rubber: No    GMDN P.T. name: Wound hydrogel dressing, non-antimicrobial     As of 10/02/2020  Status: Implanted       Syngo Images   Show images for CARDIAC CATHETERIZATION Images on Long Term Storage   Show images for Morfin, Ndeye H Link to Procedure Log  Procedure Log    Hemo Data  Flowsheet Row Most Recent Value  Fick Cardiac Output 13.09 L/min  Fick Cardiac Output Index 6.84 (L/min)/BSA  Aortic Mean Gradient 26.65 mmHg  Aortic Peak Gradient 30 mmHg  Aortic Valve Area 3.14  Aortic Value Area Index 1.64 cm2/BSA  RA A Wave 2 mmHg  RA V Wave 1 mmHg  RA Mean 0 mmHg  RV Systolic Pressure 22 mmHg  RV Diastolic Pressure -3 mmHg  RV EDP 1 mmHg  PA Systolic Pressure 23 mmHg  PA Diastolic Pressure 2 mmHg  PA Mean 10 mmHg  PW A Wave 4 mmHg  PW V Wave 5 mmHg  PW Mean 3 mmHg  AO Systolic Pressure 1712mmHg  AO Diastolic Pressure 61 mmHg  AO Mean 87 mmHg  LV Systolic Pressure 1458mmHg  LV Diastolic Pressure -6 mmHg  LV EDP 11 mmHg  AOp Systolic Pressure 1099mmHg  AOp Diastolic Pressure 58 mmHg  AOp Mean Pressure 86 mmHg  LVp Systolic Pressure 1833mmHg  LVp Diastolic Pressure 0 mmHg  LVp EDP Pressure 10 mmHg  QP/QS 1  TPVR Index 1.46 HRUI  TSVR Index 12.72 HRUI  TPVR/TSVR Ratio 0.11    Recent Labs: 09/24/2020: Platelets 205 10/02/2020: Hemoglobin 11.9 01/06/2021: ALT 24; BUN 19; Creatinine, Ser 1.16; Potassium 4.6; Sodium 137; TSH 2.950    Wt Readings from Last 3 Encounters:  01/20/21 184 lb (83.5 kg)  01/12/21 188 lb 0.6 oz  (85.3 kg)  12/31/20 187 lb (84.8 kg)     Other studies Reviewed: Additional studies/ records that were reviewed today include: cath images, echo images, EKG, office notes Review of the above records demonstrates: moderately severe AS  STS Risk Score: Risk of Mortality: 2.049% Renal Failure: 1.975% Permanent Stroke: 1.607% Prolonged Ventilation: 6.038% DSW Infection: 0.106% Reoperation: 2.643% Morbidity or Mortality: 10.109% Short Length of Stay: 35.149% Long Length of Stay: 4.766%   Assessment and Plan:   1. Severe Aortic Valve Stenosis: She has moderate to severe aortic valve stenosis. I have personally reviewed the echo images. The aortic valve is  thickened, calcified with limited leaflet mobility. The valve leaflets appear to open.  The mean gradient is at highest around 25 mmHg with AVA between 0.8-0.9 cm2. I will arrange her pre-TAVR CT scans next to better evaluate her valve. Given advanced age, she is not a good candidate for conventional AVR by surgical approach. I think she may be a good candidate for TAVR if her aortic stenosis appears severe.   I have reviewed the natural history of aortic stenosis with the patient and their family members  who are present today. We have discussed the limitations of medical therapy and the poor prognosis associated with symptomatic aortic stenosis. We have reviewed potential treatment options, including palliative medical therapy, conventional surgical aortic valve replacement, and transcatheter aortic valve replacement. We discussed treatment options in the context of the patient's specific comorbid medical conditions.   We will make further decisions about proceeding with TAVR based on her CT findings. If her valve disease is felt to be severe, we would then proceed with surgical consultation. Risks and benefits of the cath procedure and the valve procedure are reviewed with the patient. She will see her dentist over the next few weeks  as well. BMET today.   Current medicines are reviewed at length with the patient today.  The patient does not have concerns regarding medicines.  The following changes have been made:  no change  Labs/ tests ordered today include:   Orders Placed This Encounter  Procedures   Basic Metabolic Panel (BMET)      Disposition:   F/U will be planned based on findings on her CT scans.    Signed, Lauree Chandler, MD 01/20/2021 4:09 PM    [facility] [address], [place], [place]  [address] Phone: [phone]; Fax: [phone]

## 2021-01-20 NOTE — Patient Instructions (Signed)
Medication Instructions:  Your physician recommends that you continue on your current medications as directed. Please refer to the Current Medication list given to you today.  *If you need a refill on your cardiac medications before your next appointment, please call your pharmacy*  Lab Work: Your physician recommends that you have lab work today- BMET  If you have labs (blood work) drawn today and your tests are completely normal, you will receive your results only by: MyChart Message (if you have MyChart) OR A paper copy in the mail If you have any lab test that is abnormal or we need to change your treatment, we will call you to review the results.  Follow-Up: At [facility], you and your health needs are our priority.  As part of our continuing mission to provide you with exceptional heart care, we have created designated Provider Care Teams.  These Care Teams include your primary Cardiologist (physician) and Advanced Practice Providers (APPs -  Physician Assistants and Nurse Practitioners) who all work together to provide you with the care you need, when you need it.  We recommend signing up for the patient portal called "MyChart".  Sign up information is provided on this After Visit Summary.  MyChart is used to connect with patients for Virtual Visits (Telemedicine).  Patients are able to view lab/test results, encounter notes, upcoming appointments, etc.  Non-urgent messages can be sent to your provider as well.   To learn more about what you can do with MyChart, go to [email or web address].    Your next appointment:   Structural Heart Team will call you with follow up appointment.

## 2021-01-28 ENCOUNTER — Other Ambulatory Visit: Payer: Self-pay

## 2021-01-28 ENCOUNTER — Ambulatory Visit (HOSPITAL_COMMUNITY)
Admission: RE | Admit: 2021-01-28 | Discharge: 2021-01-28 | Disposition: A | Discharge status: Home / Self Care | Payer: Medicare Other | Source: Ambulatory Visit | Attending: Cardiovascular Disease | Admitting: Cardiovascular Disease

## 2021-01-28 DIAGNOSIS — I517 Cardiomegaly: Secondary | ICD-10-CM | POA: Diagnosis not present

## 2021-01-28 DIAGNOSIS — I350 Nonrheumatic aortic (valve) stenosis: Secondary | ICD-10-CM | POA: Diagnosis not present

## 2021-01-28 DIAGNOSIS — I700 Atherosclerosis of aorta: Secondary | ICD-10-CM | POA: Diagnosis not present

## 2021-01-28 MED ORDER — METOPROLOL TARTRATE 5 MG/5ML IV SOLN: 5.0000 mg | Freq: Once | INTRAVENOUS | Status: DC

## 2021-01-28 MED ORDER — METOPROLOL TARTRATE 5 MG/5ML IV SOLN
INTRAVENOUS | Status: AC
  Filled 2021-01-28: qty 10

## 2021-01-28 MED ORDER — IOHEXOL 350 MG/ML SOLN
95.0000 mL | Freq: Once | INTRAVENOUS | Status: AC | PRN
  Administered 2021-01-28: 95 mL via INTRAVENOUS

## 2021-02-02 ENCOUNTER — Telehealth: Payer: Self-pay

## 2021-02-02 ENCOUNTER — Other Ambulatory Visit: Payer: Self-pay

## 2021-02-02 DIAGNOSIS — I350 Nonrheumatic aortic (valve) stenosis: Secondary | ICD-10-CM

## 2021-02-02 NOTE — Telephone Encounter (Signed)
Per Dr. Angelena Form, scheduled the patient for carotids and evaluation with Dr. Cyndia Bent on October 26. She was grateful for call and agrees with plan.

## 2021-02-09 ENCOUNTER — Other Ambulatory Visit: Payer: Self-pay

## 2021-02-09 ENCOUNTER — Telehealth: Payer: Self-pay | Admitting: Gastroenterology

## 2021-02-09 ENCOUNTER — Encounter: Payer: Self-pay | Admitting: Gastroenterology

## 2021-02-09 ENCOUNTER — Ambulatory Visit (INDEPENDENT_AMBULATORY_CARE_PROVIDER_SITE_OTHER): Payer: Medicare Other | Admitting: Gastroenterology

## 2021-02-09 VITALS — BP 142/74 | HR 71 | Temp 97.1°F | Ht 65.0 in | Wt 187.0 lb

## 2021-02-09 DIAGNOSIS — K7469 Other cirrhosis of liver: Secondary | ICD-10-CM

## 2021-02-09 DIAGNOSIS — K219 Gastro-esophageal reflux disease without esophagitis: Secondary | ICD-10-CM | POA: Diagnosis not present

## 2021-02-09 DIAGNOSIS — K5900 Constipation, unspecified: Secondary | ICD-10-CM

## 2021-02-09 DIAGNOSIS — Z85038 Personal history of other malignant neoplasm of large intestine: Secondary | ICD-10-CM | POA: Diagnosis not present

## 2021-02-09 DIAGNOSIS — I200 Unstable angina: Secondary | ICD-10-CM

## 2021-02-09 MED ORDER — LINACLOTIDE 290 MCG PO CAPS: 290.0000 ug | ORAL_CAPSULE | Freq: Every day | ORAL | 1 refills | Status: DC

## 2021-02-09 NOTE — Progress Notes (Signed)
Referring Provider: Fayrene Helper, MD Primary Care Physician:  Fayrene Helper, MD Primary GI: Dr. Abbey Chatters  Chief Complaint  Patient presents with   Constipation    Worse after TCS. Reports miralax was helping but not anymore. Has not had BM in 4 days now    HPI:   Joanna Brown is a 80 y.o. female presenting today with a history of superficial invasive adenocarcinoma arising in background of a sessile serrated polyp in ascending colon in July 2021. CT chest/abd/pelvis on file from July 2021 and repeat CT Chest/abdomen/pelvis was completed in Nov 2021 for surveillance of nonspecific small right peritoneal soft tissue nodule inferior to right liver lobe, and tiny left lower lobe pulmonary nodule. Subtle changes suggestive of cirrhosis with normal spleen. No thrombocytopenia. Repeat CT chest/abd/pelvis with stable 4 mm nodularity along right upper quadrant omentum, no progressive nodulartiy, probably present since 2012. CEA was 2.4 in July 2021. Hep C screening 2014 negative. Hep A and B negative. Underwent early interval surveillance Jan 2022 with diverticulosis in right colon, redundant colon. Caution warranted for colonoscopy in future due to age and cardiac condition. She was not a candidate for anesthesia at [facility] and underwent colonoscopy by Dr. Loletha Carrow in [place]. EGD also performed with Grade 1 esophageal varices.    Recent CTA abd/pelvis performed by Cardiology in preparation for TAVR intervention planning with no findings suspicious for metastatic disease.   Used to take Miralax daily but now not ideal results. For past 2 weeks dealing with more constipation. Hasn't gone in 4 days. Sister passed recently and was busy; she thinks diet changed some. No rectal bleeding. Some abdominal bloating. Has tried Linzess 145 mcg in the past but not strong enough. Had been taking dulcolax and Miralax with good results. Ran out of dulcolax.   Would like to do ultrasounds once a  year. Declining further EGDs. Declining Hep A/B vaccinations.   GERD: Protonix daily.   Past Medical History:  Diagnosis Date   Arthritis    OA   Blood transfusion    1980   Diabetes mellitus    Dysrhythmia    Female bladder prolapse    GERD 11/18/2008   Qualifier: Diagnosis of  By: Craige Cotta     GERD (gastroesophageal reflux disease)    Glaucoma    Heart disease    Hyperlipidemia    Hypertension    echo and stress 4/10 reports on chart, EKG ` LOV 9/12 on chart   Mild aortic stenosis    Thyroid disease     Past Surgical History:  Procedure Laterality Date   ABDOMINAL HYSTERECTOMY     ANTERIOR AND POSTERIOR REPAIR  04/26/2011   Procedure: ANTERIOR (CYSTOCELE) AND POSTERIOR REPAIR (RECTOCELE);  Surgeon: Reece Packer, MD;  Location: [facility];  Service: Urology;  Laterality: N/A;   BIOPSY  05/25/2020   Procedure: BIOPSY;  Surgeon: Doran Stabler, MD;  Location: [facility];  Service: Gastroenterology;;   CATARACT EXTRACTION Bilateral    with IOL   CHOLECYSTECTOMY  2009   COLONOSCOPY N/A 12/02/2013   three colon polyps removed, small internal hemorrhoids. Hyperplastic polyps   COLONOSCOPY N/A 11/20/2019   pancolonic diverticulosis, two 10-11 mm polyps in ascending colon, one 5 mm polyp in cecum, ascending colon with superficially invasive adenocarcinoma arising in background of sessile serrated polyps with low and high grade cytologic dysplasia.   COLONOSCOPY     COLONOSCOPY W/ POLYPECTOMY     COLONOSCOPY WITH PROPOFOL  N/A 05/25/2020   diverticulosis in right colon, redundant colon. Caution warranted on future colonoscopy in light of age, cardiac condition, challenging anatomy.    ESOPHAGOGASTRODUODENOSCOPY (EGD) WITH PROPOFOL N/A 05/25/2020   Grade 1 esophageal varices, single mucosal nodule in stomach s/p biopsy. (hyperplastic)>    LEFT HEART CATH  09/10/2008   normal coronary arteries, normal LV systolic function, EF 36% (Dr. [name])   [place] Right  1997   under arm   NM MYOCAR PERF WALL MOTION  2010   dipyridamole - mild-mod in intenstiy perfusion defect in mid anterior, mid anteroseptal wall, EF 70%   OVARY SURGERY     bilateral tumors removed   POLYPECTOMY  11/20/2019   Procedure: POLYPECTOMY;  Surgeon: Daneil Dolin, MD;  [facility];  Service: Endoscopy;;  hot and cold snare cecal polyp, and asending polyps x 2   RIGHT/LEFT HEART CATH AND CORONARY ANGIOGRAPHY N/A 10/02/2020   Procedure: RIGHT/LEFT HEART CATH AND CORONARY ANGIOGRAPHY;  Surgeon: Martinique, Peter M, MD;  Location: [facility];  Service: Cardiovascular;  Laterality: N/A;   THYROIDECTOMY     THYROIDECTOMY  02/2008   TRANSTHORACIC ECHOCARDIOGRAM  08/2011   EF=>55%, mild conc LVH; trace MR; mild TR; mild-mod AV calcification with mild valvular AV stenosis   VAGINAL PROLAPSE REPAIR  04/26/2011   Procedure: VAGINAL VAULT SUSPENSION;  Surgeon: Reece Packer, MD;  Location: [facility];  Service: Urology;  Laterality: N/A;  with Graft  10x6    Current Outpatient Medications  Medication Sig Dispense Refill   Accu-Chek FastClix Lancets MISC 1 each by Other route 2 (two) times daily. 102 each 2   ALPHAGAN P 0.1 % SOLN Place 1 drop into both eyes 2 (two) times daily.     amLODipine (NORVASC) 10 MG tablet TAKE 1 TABLET(10 MG) BY MOUTH EVERY MORNING 90 tablet 3   Ascorbic Acid (VITAMIN C) 1000 MG tablet Take 1,000 mg by mouth 4 (four) times a week. AT NIGHT     aspirin EC 81 MG tablet Take 81 mg by mouth every other day. In the morning. Swallow whole.     benazepril (LOTENSIN) 40 MG tablet TAKE 1 TABLET(40 MG) BY MOUTH DAILY 90 tablet 1   Blood Glucose Monitoring Suppl (ACCU-CHEK GUIDE) w/Device KIT 1 each by Does not apply route 4 (four) times daily. 1 kit 0   cholecalciferol (VITAMIN D3) 25 MCG (1000 UT) tablet Take 1,000 Units by mouth in the morning.     clotrimazole-betamethasone (LOTRISONE) cream Apply twice daily for 1 week, to affected area , then as needed  (Patient taking differently: Apply 1 application topically 2 (two) times daily as needed (itching/skin irritation).) 45 g 0   ezetimibe (ZETIA) 10 MG tablet Take 1 tablet (10 mg total) by mouth daily. 90 tablet 3   glipiZIDE (GLUCOTROL XL) 5 MG 24 hr tablet Take 1 tablet (5 mg total) by mouth daily with breakfast. 90 tablet 1   glucose blood (ACCU-CHEK GUIDE) test strip USE TO CHECK BLOOD SUGAR TWICE DAILY 150 strip 1   insulin isophane & regular human KwikPen (HUMULIN 70/30 MIX) (70-30) 100 UNIT/ML KwikPen Inject 60 units with breakfast and 40 units with supper only if glucose is above 90 60 mL 3   Insulin Pen Needle (B-D ULTRAFINE III SHORT PEN) 31G X 8 MM MISC USE AS DIRECTED TWO TIMES DAILY WITH INSULIN PENS 100 each 5   latanoprost (XALATAN) 0.005 % ophthalmic solution Place 1 drop into  both eyes at bedtime.      metFORMIN (GLUCOPHAGE) 500 MG tablet TAKE 1 TABLET BY MOUTH EVERY DAY WITH BREAKFAST 60 tablet 2   Multiple Vitamin (MULTIVITAMIN WITH MINERALS) TABS tablet Take 1 tablet by mouth daily.     Omega-3 Fatty Acids (FISH OIL) 1200 MG CAPS Take 1,200 mg by mouth every morning.     pantoprazole (PROTONIX) 40 MG tablet Take 1 tablet (40 mg total) by mouth daily. 30 minutes before meal 90 tablet 1   polyethylene glycol (MIRALAX) 17 g packet Take 17 g by mouth at bedtime. (Patient taking differently: Take 17 g by mouth daily. IN THE MORNING) 14 each 0   pravastatin (PRAVACHOL) 80 MG tablet Take 1 tablet (80 mg total) by mouth in the morning. 90 tablet 3   triamterene-hydrochlorothiazide (MAXZIDE) 75-50 MG tablet Take 0.5 tablets by mouth daily. 45 tablet 0   No current facility-administered medications for this visit.    Allergies as of 02/09/2021 - Review Complete 02/09/2021  Allergen Reaction Noted   Senokot wheat bran [wheat bran]  01/18/2018   Spironolactone  03/24/2014    Family History  Problem Relation Age of Onset   Stomach cancer Mother 1   Heart disease Mother 39        heart disease   Heart disease Father 12       MI   Stroke Maternal Grandfather    Heart attack Paternal Grandfather    Hypertension Brother    Bone cancer Brother    Hypertension Sister    Liver cancer Sister    Hypertension Sister    Hypertension Child    Colon cancer Neg Hx    Colon polyps Neg Hx    Pancreatic cancer Neg Hx    Esophageal cancer Neg Hx    Rectal cancer Neg Hx     Social History   Socioeconomic History   Marital status: Married    Spouse name: [name]   Number of children: 1   Years of education: Trade   Highest education level: 12th grade  Occupational History   Occupation: Retired   Occupation: Writer  Tobacco Use   Smoking status: Never   Smokeless tobacco: Never  Scientific laboratory technician Use: Never used  Substance and Sexual Activity   Alcohol use: No    Comment: socially- none x 30 years   Drug use: No   Sexual activity: Yes  Other Topics Concern   Not on file  Social History Narrative   Patient lives at home with spouse. 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 LIVED IN [place] FOR 50 YRS AND CAME BACK TO [place] ~2009. Caffeine Use: 1 cup of coffee daily. HAD ONE CHILD: PASSED 5 YRS. HAVE THREE GRAND-KIDS AND TWO GREAT GRANDS.    Social Determinants of Health   Financial Resource Strain: Low Risk    Difficulty of Paying Living Expenses: Not hard at all  Food Insecurity: No Food Insecurity   Worried About Charity fundraiser in the Last Year: Never true   [place] in the Last Year: Never true  Transportation Brown: No Transportation Brown   Lack of Transportation (Medical): No   Lack of Transportation (Non-Medical): No  Physical Activity: Sufficiently Active   Days of Exercise per Week: 5 days   Minutes of Exercise per Session: 30 min  Stress: No Stress Concern Present   Feeling of Stress : Not at all  Social Connections: Moderately  Integrated   Frequency of Communication with Friends and Family: More  than three times a week   Frequency of Social Gatherings with Friends and Family: Twice a week   Attends Religious Services: More than 4 times per year   Active Member of [employer] or Organizations: No   Attends Archivist Meetings: Never   Marital Status: Married    Review of Systems: Gen: Denies fever, chills, anorexia. Denies fatigue, weakness, weight loss.  CV: Denies chest pain, palpitations, syncope, peripheral edema, and claudication. Resp: Denies dyspnea at rest, cough, wheezing, coughing up blood, and pleurisy. GI: see HPI Derm: Denies rash, itching, dry skin Psych: Denies depression, anxiety, memory loss, confusion. No homicidal or suicidal ideation.  Heme: Denies bruising, bleeding, and enlarged lymph nodes.  Physical Exam: BP (!) 142/74   Pulse 71   Temp (!) 97.1 F (36.2 C)   Ht _0  (1.651 m)   Wt 187 lb (84.8 kg)   BMI 31.12 kg/m  General:   Alert and oriented. No distress noted. Pleasant and cooperative.  Head:  Normocephalic and atraumatic. Eyes:  Conjuctiva clear without scleral icterus. Mouth:  mask in place Abdomen:  +BS, soft, non-tender and non-distended. No rebound or guarding. No HSM or masses noted. Msk:  Symmetrical without gross deformities. Normal posture. Extremities:  Without edema. Neurologic:  Alert and  oriented x4 Psych:  Alert and cooperative. Normal mood and affect.  ASSESSMENT: SHARNIECE GIBBON is a 80 y.o. female presenting today with a history of superficial invasive adenocarcinoma arising in background of a sessile serrated polyp in ascending colon in July 2021. Underwent early interval surveillance Jan 2022 with diverticulosis in right colon, redundant colon. Caution warranted for colonoscopy in future due to age and cardiac condition. She was not a candidate for anesthesia at [facility] and underwent colonoscopy by Dr. Loletha Carrow in [place]. CEA 2.4 in July 2021. Imaging for staging without metastatic disease. Incidentally found to  likely have cirrhosis due to fatty liver.  Returning today with constipation. Previously taking Miralax and at times Dulcolax, which worked well for her. I have advised a Miralax purge today, then trial of Linzess 290 mcg for her starting tomorrow. Insurance may be a hurdle due to coverage; in this case, will have to explore OTC agents. She is to call with updates.   Cirrhosis: declining every 6 month [place]. Next will be in Sept 2023. EGD with Grade 1 varices Jan 2022; she is declining any surveillance of this. Declining Hep A/B vaccinations. She is fairly well-compensated and majority of issues are cardiac-related.   GERD: Protonix daily.    PLAN:  Miralax purge Attempt to see if Linzess is covered. Sent to pharmacy [place] in Sept 2023 Call with update 6 month follow-up   Joanna Needs, PhD, ANP-BC [facility]

## 2021-02-09 NOTE — Patient Instructions (Signed)
For constipation today, you can take 1 capful of Miralax in 8 ounces of water every hour up to 6 doses (example: 1pm, 2pm, 3 pm, 4pm, 5pm, 6pm).   You can start Linzess tomorrow, taking one capsule on an empty stomach 30 minutes before eating. Let me know if this is not covered well with insurance.   We will see you in 6 months!  I enjoyed seeing you again today! As you know, I value our relationship and want to provide genuine, compassionate, and quality care. I welcome your feedback. If you receive a survey regarding your visit,  I greatly appreciate you taking time to fill this out. See you next time!  Annitta Needs, PhD, ANP-BC [facility]

## 2021-02-09 NOTE — Telephone Encounter (Signed)
PA for Linzess [address] [place] was approved from 01/10/2021 through 02/09/2022. Approval letter will be scanned into patient's chart.

## 2021-02-15 ENCOUNTER — Other Ambulatory Visit: Payer: Self-pay | Admitting: "Endocrinology

## 2021-02-15 DIAGNOSIS — Z794 Long term (current) use of insulin: Secondary | ICD-10-CM

## 2021-02-15 DIAGNOSIS — N181 Chronic kidney disease, stage 1: Secondary | ICD-10-CM

## 2021-02-16 ENCOUNTER — Telehealth: Payer: Self-pay | Admitting: Internal Medicine

## 2021-02-16 NOTE — Telephone Encounter (Signed)
Pt called to let us know she got her linzess prescription for $35

## 2021-02-16 NOTE — Telephone Encounter (Signed)
noted 

## 2021-02-23 ENCOUNTER — Other Ambulatory Visit: Payer: Self-pay

## 2021-02-23 ENCOUNTER — Encounter: Payer: Self-pay | Admitting: Podiatry

## 2021-02-23 ENCOUNTER — Ambulatory Visit (INDEPENDENT_AMBULATORY_CARE_PROVIDER_SITE_OTHER): Payer: Medicare Other | Admitting: Podiatry

## 2021-02-23 DIAGNOSIS — Z794 Long term (current) use of insulin: Secondary | ICD-10-CM | POA: Diagnosis not present

## 2021-02-23 DIAGNOSIS — E1122 Type 2 diabetes mellitus with diabetic chronic kidney disease: Secondary | ICD-10-CM

## 2021-02-23 DIAGNOSIS — N181 Chronic kidney disease, stage 1: Secondary | ICD-10-CM | POA: Diagnosis not present

## 2021-02-23 DIAGNOSIS — M79674 Pain in right toe(s): Secondary | ICD-10-CM | POA: Diagnosis not present

## 2021-02-23 DIAGNOSIS — B351 Tinea unguium: Secondary | ICD-10-CM

## 2021-02-23 DIAGNOSIS — M79675 Pain in left toe(s): Secondary | ICD-10-CM | POA: Diagnosis not present

## 2021-02-23 DIAGNOSIS — I739 Peripheral vascular disease, unspecified: Secondary | ICD-10-CM

## 2021-02-23 NOTE — Patient Instructions (Signed)
[facility]ac[facility] Pontes will call you to schedule Vascular testing for your lower extremities.

## 2021-02-27 NOTE — Progress Notes (Signed)
  Subjective:  Patient ID: Joanna Brown, female    DOB: 1940/07/15,  MRN: [id number]  80 y.o. female presents with preventative diabetic foot care and painful thick toenails that are difficult to trim. Pain interferes with ambulation. Aggravating factors include wearing enclosed shoe gear. Pain is relieved with periodic professional debridement.  Patient c/o that her left foot stays cold most of the time as well as LLE calf pain at times when ambulating. She states this has been going on for a while. She is a never smoker.   Patient's blood sugar was 114 mg/dl today. She is accompanied by her husband who also has an appointment on today.  PCP: Fayrene Helper, MD and last visit was: 01/12/2021  Review of Systems: Negative except as noted in the HPI.   Allergies  Allergen Reactions   Senokot Wheat Bran [Wheat Bran]     ABDOMINAL CRAMPS   Spironolactone     Stomach problems, vision changes     Objective:  There were no vitals filed for this visit. Constitutional Patient is a pleasant 80 y.o. African American female in NAD. AAO x 3.  Vascular Capillary refill time to digits immediate b/l. Palpable pedal pulses b/l LE. Pedal hair sparse. Lower extremity skin temperature gradient within normal limits. No pain with calf compression b/l. No edema noted b/l lower extremities. No cyanosis or clubbing noted.  Neurologic Normal speech. Protective sensation intact 5/5 intact bilaterally with 10g monofilament b/l.  Dermatologic Pedal skin with normal turgor, texture and tone b/l lower extremities. No open wounds b/l lower extremities. No interdigital macerations b/l lower extremities. Toenails 1-5 b/l elongated, discolored, dystrophic, thickened, crumbly with subungual debris and tenderness to dorsal palpation.  Orthopedic: Normal muscle strength 5/5 to all lower extremity muscle groups bilaterally. No pain crepitus or joint limitation noted with ROM b/l. Hallux valgus with bunion deformity noted  b/l lower extremities.   Hemoglobin A1C Latest Ref Rng & Units 12/31/2020 09/08/2020 06/10/2020 03/10/2020  HGBA1C 0.0 - 7.0 % 7.8(A) 8.4(A) 8.6(A) 8.8(A)  Some recent data might be hidden   Assessment:   1. Pain due to onychomycosis of toenails of both feet   2. Intermittent claudication ([place])   3. Type 2 diabetes mellitus with stage 1 chronic kidney disease, with long-term current use of insulin ([place])    Plan:  -Patient was evaluated and treated and all questions answered. -Examined patient. -Continue Formula 7 Solution for mycotic toenails. -Ordered noninvasive arterial studies segmentals and ABIs for b/l lower extremities for symptoms of intermittent claudication of left LE. Risk factors: diabetes and hyperlipidemia. -Patient to continue soft, supportive shoe gear daily. -Toenails 1-5 b/l were debrided in length and girth with sterile nail nippers and dremel without iatrogenic bleeding.  -Patient to report any pedal injuries to medical professional immediately. -Patient/POA to call should there be question/concern in the interim.  Return in about 3 months (around 05/26/2021).  Marzetta Board, DPM

## 2021-03-01 ENCOUNTER — Other Ambulatory Visit: Payer: Self-pay

## 2021-03-01 ENCOUNTER — Encounter: Payer: Medicare Other | Attending: "Endocrinology | Admitting: Nutrition

## 2021-03-01 ENCOUNTER — Encounter: Payer: Self-pay | Admitting: Nutrition

## 2021-03-01 VITALS — Ht 65.0 in | Wt 187.0 lb

## 2021-03-01 DIAGNOSIS — E1122 Type 2 diabetes mellitus with diabetic chronic kidney disease: Secondary | ICD-10-CM | POA: Diagnosis not present

## 2021-03-01 DIAGNOSIS — Z794 Long term (current) use of insulin: Secondary | ICD-10-CM | POA: Insufficient documentation

## 2021-03-01 DIAGNOSIS — E782 Mixed hyperlipidemia: Secondary | ICD-10-CM | POA: Diagnosis not present

## 2021-03-01 DIAGNOSIS — N181 Chronic kidney disease, stage 1: Secondary | ICD-10-CM | POA: Diagnosis not present

## 2021-03-01 DIAGNOSIS — I10 Essential (primary) hypertension: Secondary | ICD-10-CM | POA: Diagnosis not present

## 2021-03-01 DIAGNOSIS — K219 Gastro-esophageal reflux disease without esophagitis: Secondary | ICD-10-CM

## 2021-03-01 NOTE — Progress Notes (Signed)
Medical Nutrition Therapy  Follow upo Appointment Start time:  1000  Appointment End time:  69  Primary concerns today: DM Type 2, Overweight Referral diagnosis: E11.8, E66.9 Preferred learning style:  No preferences.  Learning readiness: Ready   NUTRITION ASSESSMENT   Currently taking 70/30 insulin 60 units in am and 40 units  at night. Glipizide 5 mg a day. FBS 150's.  7 day avg  176 mg/dl, 14 day avg 168 mg/dl 30 day 166 mg/dl.  Anthropometrics  Wt Readings from Last 3 Encounters:  02/09/21 187 lb (84.8 kg)  01/20/21 184 lb (83.5 kg)  01/12/21 188 lb 0.6 oz (85.3 kg)   Ht Readings from Last 3 Encounters:  02/09/21 5\' 5"  (1.651 m)  01/20/21 5\' 5"  (1.651 m)  01/12/21 5\' 5"  (1.651 m)   There is no height or weight on file to calculate BMI. @BMIFA @ Facility age limit for growth percentiles is 20 years. Facility age limit for growth percentiles is 20 years.    Clinical Medical Hx: DM, Obesity.  Medications: Glipizide, 70/30 60 units in am and 40 units pm Labs:  Lab Results  Component Value Date   HGBA1C 7.8 (A) 12/31/2020   CMP Latest Ref Rng & Units 01/20/2021 01/06/2021 10/02/2020  Glucose 65 - 99 mg/dL 74 155(H) -  BUN 8 - 27 mg/dL 12 19 -  Creatinine 0.57 - 1.00 mg/dL 1.09(H) 1.16(H) -  Sodium 134 - 144 mmol/L 138 137 139  Potassium 3.5 - 5.2 mmol/L 3.9 4.6 3.3(L)  Chloride 96 - 106 mmol/L 98 100 -  CO2 20 - 29 mmol/L 24 24 -  Calcium 8.7 - 10.3 mg/dL 10.4(H) 10.3 -  Total Protein 6.0 - 8.5 g/dL - 6.9 -  Total Bilirubin 0.0 - 1.2 mg/dL - 0.3 -  Alkaline Phos 44 - 121 IU/L - 51 -  AST 0 - 40 IU/L - 24 -  ALT 0 - 32 IU/L - 24 -   Lipid Panel     Component Value Date/Time   CHOL 161 01/06/2021 0802   TRIG 167 (H) 01/06/2021 0802   HDL 38 (L) 01/06/2021 0802   CHOLHDL 4.2 01/06/2021 0802   CHOLHDL 4.7 07/30/2019 0718   VLDL 39 12/05/2017 0832   LDLCALC 94 01/06/2021 0802   LDLCALC 99 07/30/2019 0718   LABVLDL 29 01/06/2021 0802    Notable Signs/Symptoms:  Sometimes she gets shaky or is really thirsty  Lifestyle & Dietary Hx Wants to improve her DM. Eats 3 meals per day. Cooks from scratch.  Admits to sometimes missing her insulin because she forgets or she eats later after supper.   Estimated daily fluid intake: 84 oz Supplements:  Sleep: good Stress / self-care: none Current average weekly physical activity: Walks  24-Hr Dietary Recall First Meal: Cheerios, almond milk,  Snack:  Second Meal: [place] sandwich with Air cabin crew, water Snack:  Third Meal: Turnip greens, squash, cornbread, water Snack: shortbread cookie Beverages: water  Estimated Energy Needs Calories: 1200  Carbohydrate: 135g Protein: 90g Fat: 33g   NUTRITION DIAGNOSIS  NB-1.1 Food and nutrition-related knowledge deficit As related to Diabetes .  As evidenced by A1C 7.8%.   NUTRITION INTERVENTION  Nutrition education (E-1) on the following topics:  Nutrition and Diabetes education provided on My Plate, CHO counting, meal planning, portion sizes, timing of meals, avoiding snacks between meals unless having a low blood sugar, target ranges for A1C and blood sugars, signs/symptoms and treatment of hyper/hypoglycemia, monitoring blood sugars, taking medications as prescribed, benefits of  exercising 30 minutes per day and prevention of complications of DM.   Handouts Provided Include  My Plate Meal Plan Card   Learning Style & Readiness for Change Teaching method utilized: Visual & Auditory  Demonstrated degree of understanding via: Teach Back  Barriers to learning/adherence to lifestyle change: none  Goals Established by Pt  Don't eat after 7 pm. Increase more lower carb vegetables Don't forget insulin Set alarm on phone Try UTUBE videos for meal ideas Get A1C to 7% Check insurance for exercise   MONITORING & EVALUATION Dietary intake, weekly physical activity, and blood sugars  in 1 month.  Next Steps  Patient is to work on watching portions  and timing of meals.Marland Kitchen

## 2021-03-01 NOTE — Patient Instructions (Addendum)
Goals Established by Pt Don't eat after 7 pm. Increase more lower carb vegetables Don't forget insulin Set alarm on phone Try UTUBE videos for meal ideas Get A1C to 7% Check insurance for exercise

## 2021-03-02 ENCOUNTER — Ambulatory Visit (INDEPENDENT_AMBULATORY_CARE_PROVIDER_SITE_OTHER): Payer: Medicare Other | Admitting: Family Medicine

## 2021-03-02 ENCOUNTER — Encounter: Payer: Self-pay | Admitting: Family Medicine

## 2021-03-02 VITALS — BP 133/78 | HR 77 | Resp 16 | Ht 65.0 in | Wt 186.1 lb

## 2021-03-02 DIAGNOSIS — E663 Overweight: Secondary | ICD-10-CM

## 2021-03-02 DIAGNOSIS — I10 Essential (primary) hypertension: Secondary | ICD-10-CM

## 2021-03-02 DIAGNOSIS — E1122 Type 2 diabetes mellitus with diabetic chronic kidney disease: Secondary | ICD-10-CM

## 2021-03-02 DIAGNOSIS — Z794 Long term (current) use of insulin: Secondary | ICD-10-CM | POA: Diagnosis not present

## 2021-03-02 DIAGNOSIS — N181 Chronic kidney disease, stage 1: Secondary | ICD-10-CM

## 2021-03-02 DIAGNOSIS — E782 Mixed hyperlipidemia: Secondary | ICD-10-CM | POA: Diagnosis not present

## 2021-03-02 DIAGNOSIS — I350 Nonrheumatic aortic (valve) stenosis: Secondary | ICD-10-CM | POA: Diagnosis not present

## 2021-03-02 DIAGNOSIS — I200 Unstable angina: Secondary | ICD-10-CM

## 2021-03-02 NOTE — Patient Instructions (Addendum)
F/U in 5 months,call if you need me before  All the best with heart valve and blood sugar control  It is important that you exercise regularly at least 30 minutes 5 times a week. If you develop chest pain, have severe difficulty breathing, or feel very tired, stop exercising immediately and seek medical attention   Think about what you will eat, plan ahead. Choose " clean, green, fresh or frozen" over canned, processed or packaged foods which are more sugary, salty and fatty. 70 to 75% of food eaten should be vegetables and fruit. Three meals at set times with snacks allowed between meals, but they must be fruit or vegetables. Aim to eat over a 12 hour period , example 7 am to 7 pm, and STOP after  your last meal of the day. Drink water,generally about 64 ounces per day, no other drink is as healthy. Fruit juice is best enjoyed in a healthy way, by EATING the fruit.   Thanks for choosing [facility], we consider it a privelige to serve you.   Flu vaccine available  Please get TdAP and shingrix vaccines

## 2021-03-02 NOTE — Assessment & Plan Note (Signed)
  Patient re-educated about  the importance of commitment to a  minimum of 150 minutes of exercise per week as able.  The importance of healthy food choices with portion control discussed, as well as eating regularly and within a 12 hour window most days. The need to choose "clean , green" food 50 to 75% of the time is discussed, as well as to make water the primary drink and set a goal of 64 ounces water daily.    Weight /BMI 03/02/2021 03/01/2021 02/09/2021  WEIGHT 186 lb 1.9 oz 187 lb 187 lb  HEIGHT 5\' 5"  5\' 5"  5\' 5"   BMI 30.97 kg/m2 31.12 kg/m2 31.12 kg/m2

## 2021-03-02 NOTE — Assessment & Plan Note (Signed)
Controlled, no change in medication DASH diet and commitment to daily physical activity for a minimum of 30 minutes discussed and encouraged, as a part of hypertension management. The importance of attaining a healthy weight is also discussed.  BP/Weight 03/02/2021 03/01/2021 02/09/2021 01/28/2021 01/20/2021 01/12/2021 0/76/2263  Systolic BP 335 - 456 256 389 373 428  Diastolic BP 78 - 74 69 70 67 72  Wt. (Lbs) 186.12 187 187 - 184 188.04 187  BMI 30.97 31.12 31.12 - 30.62 31.29 31.12

## 2021-03-02 NOTE — Progress Notes (Signed)
Joanna Brown     MRN: [id number]      DOB: 12-26-1940   HPI Joanna Brown is here for follow up and re-evaluation of chronic medical conditions, medication management and review of any available recent lab and radiology data.  Preventive health is updated, specifically  Cancer screening and Immunization.   Heart valve surgery anticipated in the near future, c/o increasing fatigue and poor exercise tolerance The PT denies any adverse reactions to current medications since the last visit.  Denies polyuria, polydipsia, blurred vision , or hypoglycemic episodes.   ROS Denies recent fever or chills. Denies sinus pressure, nasal congestion, ear pain or sore throat. Denies chest congestion, productive cough or wheezing. Denies chest pains, palpitations and leg swelling Denies abdominal pain, nausea, vomiting,diarrhea or constipation.  Bilateral index finger pain x 2 weeks  Denies dysuria, frequency, hesitancy or incontinence.  Denies headaches, seizures, numbness, or tingling. Denies depression, anxiety or insomnia. Denies skin break down or rash.   PE  BP 133/78   Pulse 77   Resp 16   Ht 5\' 5"  (1.651 m)   Wt 186 lb 1.9 oz (84.4 kg)   SpO2 95%   BMI 30.97 kg/m   Patient alert and oriented and in no cardiopulmonary distress.  HEENT: No facial asymmetry, EOMI,     Neck supple .  Chest: Clear to auscultation bilaterally.  CVS: S1, S2 systolic  murmur, no S3.Regular rate.  ABD: Soft non tender.   Ext: No edema  MS: Adequate ROM spine, shoulders, hips and knees.  Skin: Intact, no ulcerations or rash noted.  Psych: Good eye contact, normal affect. Memory intact not anxious or depressed appearing.  CNS: CN 2-12 intact, power,  normal throughout.no focal deficits noted.   Assessment & Plan  Essential hypertension Controlled, no change in medication DASH diet and commitment to daily physical activity for a minimum of 30 minutes discussed and encouraged, as a part of  hypertension management. The importance of attaining a healthy weight is also discussed.  BP/Weight 03/02/2021 03/01/2021 02/09/2021 01/28/2021 01/20/2021 01/12/2021 2/70/3500  Systolic BP 938 - 182 993 716 967 893  Diastolic BP 78 - 74 69 70 67 72  Wt. (Lbs) 186.12 187 187 - 184 188.04 187  BMI 30.97 31.12 31.12 - 30.62 31.29 31.12       Aortic stenosis Worsening , surgery  Anticipated in the near future   Mixed hyperlipidemia Hyperlipidemia:Low fat diet discussed and encouraged.   Lipid Panel  Lab Results  Component Value Date   CHOL 161 01/06/2021   HDL 38 (L) 01/06/2021   LDLCALC 94 01/06/2021   TRIG 167 (H) 01/06/2021   CHOLHDL 4.2 01/06/2021   Needs to reduce fried and  Fatty foods Updated lab needed at/ before next visit.     Overweight (BMI 25.0-29.9)  Patient re-educated about  the importance of commitment to a  minimum of 150 minutes of exercise per week as able.  The importance of healthy food choices with portion control discussed, as well as eating regularly and within a 12 hour window most days. The need to choose "clean , green" food 50 to 75% of the time is discussed, as well as to make water the primary drink and set a goal of 64 ounces water daily.    Weight /BMI 03/02/2021 03/01/2021 02/09/2021  WEIGHT 186 lb 1.9 oz 187 lb 187 lb  HEIGHT 5\' 5"  5\' 5"  5\' 5"   BMI 30.97 kg/m2 31.12 kg/m2 31.12 kg/m2  Type 2 diabetes mellitus with stage 1 chronic kidney disease, with long-term current use of insulin ([place]) Joanna Brown is reminded of the importance of commitment to daily physical activity for 30 minutes or more, as able and the need to limit carbohydrate intake to 30 to 60 grams per meal to help with blood sugar control.   The need to take medication as prescribed, test blood sugar as directed, and to call between visits if there is a concern that blood sugar is uncontrolled is also discussed.   Joanna Brown is reminded of the importance of daily foot  exam, annual eye examination, and good blood sugar, blood pressure and cholesterol control. Improved, managed by Endo  Diabetic Labs Latest Ref Rng & Units 01/20/2021 01/06/2021 12/31/2020 09/24/2020 09/08/2020  HbA1c 0.0 - 7.0 % - - 7.8(A) - 8.4(A)  Microalbumin mg/dL - - - - -  Micro/Creat Ratio <30 mcg/mg creat - - - - -  Chol 100 - 199 mg/dL - 161 - - -  HDL >39 mg/dL - 38(L) - - -  Calc LDL 0 - 99 mg/dL - 94 - - -  Triglycerides 0 - 149 mg/dL - 167(H) - - -  Creatinine 0.57 - 1.00 mg/dL 1.09(H) 1.16(H) - 1.09(H) -  GFR >60.00 mL/min - - - - -   BP/Weight 03/02/2021 03/01/2021 02/09/2021 01/28/2021 01/20/2021 01/12/2021 05/21/2692  Systolic BP 854 - 627 035 009 381 829  Diastolic BP 78 - 74 69 70 67 72  Wt. (Lbs) 186.12 187 187 - 184 188.04 187  BMI 30.97 31.12 31.12 - 30.62 31.29 31.12   Foot/eye exam completion dates Latest Ref Rng & Units 10/24/2019 09/25/2018  Eye Exam No Retinopathy No Retinopathy No Retinopathy  Foot Form Completion - - -

## 2021-03-02 NOTE — Assessment & Plan Note (Signed)
[name] is reminded of the importance of commitment to daily physical activity for 30 minutes or more, as able and the need to limit carbohydrate intake to 30 to 60 grams per meal to help with blood sugar control.   The need to take medication as prescribed, test blood sugar as directed, and to call between visits if there is a concern that blood sugar is uncontrolled is also discussed.   [name] is reminded of the importance of daily foot exam, annual eye examination, and good blood sugar, blood pressure and cholesterol control. Improved, managed by Endo  Diabetic Labs Latest Ref Rng & Units 01/20/2021 01/06/2021 12/31/2020 09/24/2020 09/08/2020  HbA1c 0.0 - 7.0 % - - 7.8(A) - 8.4(A)  Microalbumin mg/dL - - - - -  Micro/Creat Ratio <30 mcg/mg creat - - - - -  Chol 100 - 199 mg/dL - 161 - - -  HDL >39 mg/dL - 38(L) - - -  Calc LDL 0 - 99 mg/dL - 94 - - -  Triglycerides 0 - 149 mg/dL - 167(H) - - -  Creatinine 0.57 - 1.00 mg/dL 1.09(H) 1.16(H) - 1.09(H) -  GFR >60.00 mL/min - - - - -   BP/Weight 03/02/2021 03/01/2021 02/09/2021 01/28/2021 01/20/2021 01/12/2021 2/87/6811  Systolic BP 572 - 620 355 974 163 845  Diastolic BP 78 - 74 69 70 67 72  Wt. (Lbs) 186.12 187 187 - 184 188.04 187  BMI 30.97 31.12 31.12 - 30.62 31.29 31.12   Foot/eye exam completion dates Latest Ref Rng & Units 10/24/2019 09/25/2018  Eye Exam No Retinopathy No Retinopathy No Retinopathy  Foot Form Completion - - -

## 2021-03-02 NOTE — Assessment & Plan Note (Signed)
Worsening , surgery  Anticipated in the near future

## 2021-03-02 NOTE — Assessment & Plan Note (Signed)
Hyperlipidemia:Low fat diet discussed and encouraged.   Lipid Panel  Lab Results  Component Value Date   CHOL 161 01/06/2021   HDL 38 (L) 01/06/2021   LDLCALC 94 01/06/2021   TRIG 167 (H) 01/06/2021   CHOLHDL 4.2 01/06/2021   Needs to reduce fried and  Fatty foods Updated lab needed at/ before next visit.

## 2021-03-03 DIAGNOSIS — Z20822 Contact with and (suspected) exposure to covid-19: Secondary | ICD-10-CM | POA: Diagnosis not present

## 2021-03-04 ENCOUNTER — Ambulatory Visit: Payer: Medicare Other | Admitting: Nutrition

## 2021-03-09 LAB — HM DIABETES EYE EXAM

## 2021-03-10 ENCOUNTER — Encounter: Payer: Medicare Other | Admitting: Surgery

## 2021-03-10 ENCOUNTER — Ambulatory Visit (HOSPITAL_COMMUNITY): Payer: Medicare Other

## 2021-03-15 ENCOUNTER — Ambulatory Visit (INDEPENDENT_AMBULATORY_CARE_PROVIDER_SITE_OTHER): Payer: Medicare Other | Admitting: Internal Medicine

## 2021-03-15 ENCOUNTER — Other Ambulatory Visit: Payer: Self-pay

## 2021-03-15 ENCOUNTER — Encounter: Payer: Self-pay | Admitting: Internal Medicine

## 2021-03-15 VITALS — BP 130/68 | HR 82 | Ht 65.0 in | Wt 189.0 lb

## 2021-03-15 DIAGNOSIS — E785 Hyperlipidemia, unspecified: Secondary | ICD-10-CM | POA: Diagnosis not present

## 2021-03-15 DIAGNOSIS — I2510 Atherosclerotic heart disease of native coronary artery without angina pectoris: Secondary | ICD-10-CM | POA: Diagnosis not present

## 2021-03-15 DIAGNOSIS — I350 Nonrheumatic aortic (valve) stenosis: Secondary | ICD-10-CM

## 2021-03-15 DIAGNOSIS — I200 Unstable angina: Secondary | ICD-10-CM

## 2021-03-15 NOTE — Progress Notes (Signed)
OFFICE NOTE  Chief Complaint:  Follow-up aortic stenosis, headaches  Primary Care Physician: Fayrene Helper, MD  HPI:  Joanna Brown is a 80 year old female who has a history of mild aortic stenosis with a valve area of approximately 1.7 cm, also a history of dyslipidemia and hypertension, both of which have been well controlled. We are seeing her back for an annual visit today. She reports actually feeling very well, started walking every day, has made major changes to her diet as reflected by a marked decrease in triglycerides. Unfortunately recently she had a mild elevation in liver enzymes slightly above normal on lovastatin. Typically we could tolerate liver enzymes up to 3 times normal on statin medications, however, she was taken off the lovastatin. Either way it is questionable whether she really needs the additional statin at this time and this certainly argues that she should have further workup as to why she had elevated liver enzymes, whether this is due to a new non-alcoholic steatohepatitis or perhaps concomitant medications causing her elevated liver enzymes.  In fact, I reviewed her CT scan today he years ago and she does have steatohepatitis.  Therefore she is not a good candidate for a statin medication. Her blood pressure seems to be well-controlled on her current regimen.  I saw Joanna Brown back in the office today. She is without any new complaints. She occasionally gets some heartburn and is not currently taking medication for that. She denies any chest pain or worsening shortness of breath.  Joanna Brown returns today for follow-up. Again she is without complaints. We discussed her aortic stenosis however she seemed to have little recollection that she has this disorder. I again went over aortic stenosis and the fact that she has mild to moderate narrowing of the aortic valve. This time we are monitoring it clinically. Her last echo was in April of last year. She is  asymptomatic. Her blood pressure is well controlled. She had recent laboratory work which shows an LDL cholesterol of 70 in October which is good control. Her hemoglobin A1c is 7 which is down from 8.3 prior to that. I've encouraged her to continue with this trend is good cholesterol and blood sugar control her helpful in slowing the process of aortic stenosis.  09/12/2016  Joanna Brown was seen today in follow-up. Overall she seems to be doing well. She has no complaints such as shortness of breath or chest pain. EKG is stable showing normal sinus rhythm at 70 with LAFB. Blood pressure is at goal today. She reports her 11 A1c is in the low 7 range. Cholesterol is also been fairly well controlled. She does have a history of moderate aortic stenosis and is due for repeat echo.  09/12/2017  Joanna Brown was seen today in follow-up.  Over the past year she denies any chest pain or worsening shortness of breath.  Unfortunately her hemoglobin A1c is up from the low sevens to 8.2.  Cholesterol also is higher than goal.  Her total cholesterol is 171, HDL 40, LDL 97 and triglycerides 227.  She reports compliance with pravastatin.  She has moderate aortic stenosis with a mean gradient of 20 mmHg based on echo last year and normal LV function.  She did report one brief episode of chest discomfort with more marked exertion a few days ago.  This was right sided underneath the right breast and was a crampy sensation which improved after resting.  I advised that she monitor that further  and if she has more recurrence we may need to consider stress testing.  12/31/2018  Joanna Brown is seen today for routine follow-up.  She was seen in April 2019.  Since then she reports she was doing fairly well up to about 2 weeks ago.  She has had some worsening shortness of breath, particularly at night and while laying down.  She has a mild nonproductive cough.  She also reports some occasional lower extremity edema.  She gets some  shortness of breath with exertion but denies any chest pain, pressure, heaviness or other anginal symptoms.  EKG today shows incomplete right bundle branch pattern.  Her diabetes not well controlled with A1c recently of 8.8.  Her recent cholesterol profile in February showed total cholesterol 145, triglycerides 225, HDL 41 and LDL 73.  She has known aortic stenosis which is at least moderate however not assessed since 2018 by echo.  04/30/2019  Joanna Brown returns today for follow-up.  She had an echo in August 2020 which showed an EF 50 to 55%, moderate LVH, grade 1 diastolic dysfunction.  There was moderate to severe aortic stenosis with a mean gradient of 21 mmHg, AVA by VTI was 1 cm with a dimensionless index of 0.21.  Symptom wise she does report some shortness of breath with exertion but is not consistent.  She has required some pillows to elevate her head at night.  She denies any chest pain at rest.  She has had no presyncopal or syncopal episodes.  10/24/2019  Joanna Brown is seen today in follow-up.  She reports that she has had some chest pain on and off for the past 2 weeks.  She thought initially it might be something that she ate however she has had several episodes with doing house work and other activities that do improve with rest.  She was supposed to have a repeat echo in December and actually is scheduled for repeat echo next week.  On exam today her aortic valve sounds moderately severe.  These could be symptoms associated with the valve.  EKG was personally reviewed shows no new ischemic changes.  11/27/2019  Joanna Brown is seen today in follow-up.  She recently had an echocardiogram to evaluate her aortic stenosis.  As expected the valve is moderately severe.  LVEF is normal with mean gradient 29 mmHg and dimensionless index of 0.26.  Aortic valve area 0.91 cm.  This is more consistent with moderate to severe AS and it appeared visually severely stenotic suggesting possible low-flow low  gradient severe aortic stenosis.  After discussing with her today does not seem like she is having any exertional symptoms, chest pain, heart failure or presyncopal symptoms.  Unfortunately recently she underwent a colonoscopy was found to have a cancerous polyp.  That was removed however a surgical consultation was recommended.  The patient is hesitant with her surgical recommendation and wishes to see another specialist in [place] if possible.  04/16/2020  Joanna Brown returns today for follow-up.  She is due for repeat echo for aortic stenosis but not until January when it scheduled.  She denies any recurrent chest pain or worsening shortness of breath.  She says she is fairly active.  Was felt that she might have a more severe aortic stenosis with a low gradient based on her last echo in June.  Apparently no further work-up was performed regarding her colonoscopy which was abnormal.  She never had surgery because the colon was not appropriately marked.  She is supposed to  have another colonoscopy.  09/24/2020  Joanna Brown is seen today in follow-up as an urgent visit.  Over the past week or so she is noted worsening fatigue and shortness of breath.  She also had an episode that lasted about 30 minutes of left-sided chest discomfort which was felt like a pressure and associated with shortness of breath that eventually resolved.  This occurred at rest.  Over the past several months she has had some decline in her exercise ability.  She used to walk regularly but has backed off on that secondary to this.  Her last echo in January did show moderate to severe aortic stenosis, with a mean gradient of 5 mmHg up from 28 mmHg.  I had recommended a repeat echo in 6 months which would have been July 2022.  03/15/2021  Joanna Brown returns today for follow-up of her aortic stenosis.  She has been seen recently by Dr. Angelena Form and was noted to have severe aortic stenosis, thought to be a poor surgical candidate  but potentially good candidate for TAVR.  There were concerns about access for her valve.  She has an appointment with Dr. Cyndia Bent with surgery on November 7 to discuss this further.  Hopefully she will be a TAVR candidate through a vascular approach.  She denies any worsening shortness of breath or chest pain.  She is had no syncopal episodes.  She has been having some headaches and has been worked up by her PCP.  Is unclear whether this may be related to her moderate to severe aortic stenosis.  PMHx:  Past Medical History:  Diagnosis Date   Arthritis    OA   Blood transfusion    1980   Diabetes mellitus    Dysrhythmia    Female bladder prolapse    GERD 11/18/2008   Qualifier: Diagnosis of  By: Craige Cotta     GERD (gastroesophageal reflux disease)    Glaucoma    Heart disease    Hyperlipidemia    Hypertension    echo and stress 4/10 reports on chart, EKG ` LOV 9/12 on chart   Mild aortic stenosis    Thyroid disease     Past Surgical History:  Procedure Laterality Date   ABDOMINAL HYSTERECTOMY     ANTERIOR AND POSTERIOR REPAIR  04/26/2011   Procedure: ANTERIOR (CYSTOCELE) AND POSTERIOR REPAIR (RECTOCELE);  Surgeon: Reece Packer, MD;  Location: [facility];  Service: Urology;  Laterality: N/A;   BIOPSY  05/25/2020   Procedure: BIOPSY;  Surgeon: Doran Stabler, MD;  Location: [facility];  Service: Gastroenterology;;   CATARACT EXTRACTION Bilateral    with IOL   CHOLECYSTECTOMY  2009   COLONOSCOPY N/A 12/02/2013   three colon polyps removed, small internal hemorrhoids. Hyperplastic polyps   COLONOSCOPY N/A 11/20/2019   pancolonic diverticulosis, two 10-11 mm polyps in ascending colon, one 5 mm polyp in cecum, ascending colon with superficially invasive adenocarcinoma arising in background of sessile serrated polyps with low and high grade cytologic dysplasia.   COLONOSCOPY     COLONOSCOPY W/ POLYPECTOMY     COLONOSCOPY WITH PROPOFOL N/A 05/25/2020   diverticulosis in right  colon, redundant colon. Caution warranted on future colonoscopy in light of age, cardiac condition, challenging anatomy.    ESOPHAGOGASTRODUODENOSCOPY (EGD) WITH PROPOFOL N/A 05/25/2020   Grade 1 esophageal varices, single mucosal nodule in stomach s/p biopsy. (hyperplastic)>    LEFT HEART CATH  09/10/2008   normal coronary arteries, normal LV systolic function, EF 35% (  Dr. [name])   [place] Right 1997   under arm   NM MYOCAR PERF WALL MOTION  2010   dipyridamole - mild-mod in intenstiy perfusion defect in mid anterior, mid anteroseptal wall, EF 70%   OVARY SURGERY     bilateral tumors removed   POLYPECTOMY  11/20/2019   Procedure: POLYPECTOMY;  Surgeon: Daneil Dolin, MD;  Location: [facility];  Service: Endoscopy;;  hot and cold snare cecal polyp, and asending polyps x 2   RIGHT/LEFT HEART CATH AND CORONARY ANGIOGRAPHY N/A 10/02/2020   Procedure: RIGHT/LEFT HEART CATH AND CORONARY ANGIOGRAPHY;  Surgeon: Martinique, Peter M, MD;  Location: [facility];  Service: Cardiovascular;  Laterality: N/A;   THYROIDECTOMY     THYROIDECTOMY  02/2008   TRANSTHORACIC ECHOCARDIOGRAM  08/2011   EF=>55%, mild conc LVH; trace MR; mild TR; mild-mod AV calcification with mild valvular AV stenosis   VAGINAL PROLAPSE REPAIR  04/26/2011   Procedure: VAGINAL VAULT SUSPENSION;  Surgeon: Reece Packer, MD;  Location: [facility];  Service: Urology;  Laterality: N/A;  with Graft  10x6    FAMHx:  Family History  Problem Relation Age of Onset   Stomach cancer Mother 33   Heart disease Mother 50       heart disease   Heart disease Father 46       MI   Stroke Maternal Grandfather    Heart attack Paternal Grandfather    Hypertension Brother    Bone cancer Brother    Hypertension Sister    Liver cancer Sister    Hypertension Sister    Hypertension Child    Colon cancer Neg Hx    Colon polyps Neg Hx    Pancreatic cancer Neg Hx    Esophageal cancer Neg Hx    Rectal cancer Neg Hx      SOCHx:   reports that she has never smoked. She has never used smokeless tobacco. She reports that she does not drink alcohol and does not use drugs.  ALLERGIES:  Allergies  Allergen Reactions   Senokot Wheat Bran [Wheat Bran]     ABDOMINAL CRAMPS   Spironolactone     Stomach problems, vision changes     ROS: Pertinent items noted in HPI and remainder of comprehensive ROS otherwise negative.  HOME MEDS: Current Outpatient Medications  Medication Sig Dispense Refill   Accu-Chek FastClix Lancets MISC 1 each by Other route 2 (two) times daily. 102 each 2   ALPHAGAN P 0.1 % SOLN Place 1 drop into both eyes 2 (two) times daily.     amLODipine (NORVASC) 10 MG tablet TAKE 1 TABLET(10 MG) BY MOUTH EVERY MORNING 90 tablet 3   Ascorbic Acid (VITAMIN C) 1000 MG tablet Take 1,000 mg by mouth 4 (four) times a week. AT NIGHT     aspirin EC 81 MG tablet Take 81 mg by mouth every other day. In the morning. Swallow whole.     benazepril (LOTENSIN) 40 MG tablet TAKE 1 TABLET(40 MG) BY MOUTH DAILY 90 tablet 1   bisacodyl (DULCOLAX) 5 MG EC tablet Take 5 mg by mouth daily as needed for moderate constipation.     Blood Glucose Monitoring Suppl (ACCU-CHEK GUIDE) w/Device KIT 1 each by Does not apply route 4 (four) times daily. 1 kit 0   cholecalciferol (VITAMIN D3) 25 MCG (1000 UT) tablet Take 1,000 Units by mouth in the morning.     clotrimazole-betamethasone (LOTRISONE) cream Apply twice daily for 1  week, to affected area , then as needed (Patient taking differently: Apply 1 application topically 2 (two) times daily as needed (itching/skin irritation).) 45 g 0   ezetimibe (ZETIA) 10 MG tablet Take 1 tablet (10 mg total) by mouth daily. 90 tablet 3   glipiZIDE (GLUCOTROL XL) 5 MG 24 hr tablet Take 1 tablet (5 mg total) by mouth daily with breakfast. 90 tablet 1   glucose blood (ACCU-CHEK GUIDE) test strip USE TO CHECK BLOOD SUGAR TWICE DAILY 150 strip 1   insulin isophane & regular human KwikPen  (HUMULIN 70/30 MIX) (70-30) 100 UNIT/ML KwikPen Inject 60 units with breakfast and 40 units with supper only if glucose is above 90 60 mL 3   Insulin Pen Needle (B-D ULTRAFINE III SHORT PEN) 31G X 8 MM MISC USE AS DIRECTED TWICE DAILY WITH INSULIN PENS 100 each 5   latanoprost (XALATAN) 0.005 % ophthalmic solution Place 1 drop into both eyes at bedtime.      linaclotide (LINZESS) 290 MCG CAPS capsule Take 1 capsule (290 mcg total) by mouth daily before breakfast. 30 capsule 1   metFORMIN (GLUCOPHAGE) 500 MG tablet TAKE 1 TABLET BY MOUTH EVERY DAY WITH BREAKFAST 60 tablet 2   Multiple Vitamin (MULTIVITAMIN WITH MINERALS) TABS tablet Take 1 tablet by mouth daily.     Omega-3 Fatty Acids (FISH OIL) 1200 MG CAPS Take 1,200 mg by mouth every morning.     pantoprazole (PROTONIX) 40 MG tablet Take 1 tablet (40 mg total) by mouth daily. 30 minutes before meal 90 tablet 1   polyethylene glycol (MIRALAX) 17 g packet Take 17 g by mouth at bedtime. (Patient taking differently: Take 17 g by mouth daily. IN THE MORNING) 14 each 0   pravastatin (PRAVACHOL) 80 MG tablet Take 1 tablet (80 mg total) by mouth in the morning. 90 tablet 3   triamterene-hydrochlorothiazide (MAXZIDE) 75-50 MG tablet Take 0.5 tablets by mouth daily. 45 tablet 0   No current facility-administered medications for this visit.    LABS/IMAGING: No results found for this or any previous visit (from the past 48 hour(s)). No results found.  VITALS: BP 130/68 (BP Location: Right Arm, Patient Position: Sitting, Cuff Size: Normal)   Pulse 82   Ht '5\' 5"'  (1.651 m)   Wt 189 lb (85.7 kg)   BMI 31.45 kg/m   EXAM: General appearance: alert and no distress Neck: no carotid bruit and no JVD Lungs: clear to auscultation bilaterally Heart: regular rate and rhythm, S1, S2 normal and systolic murmur: late systolic 3/6, crescendo at 2nd left intercostal space Abdomen: soft, non-tender; bowel sounds normal; no masses,  no organomegaly Extremities:  extremities normal, atraumatic, no cyanosis or edema Pulses: 2+ and symmetric Skin: Skin color, texture, turgor normal. No rashes or lesions Neurologic: Grossly normal Psych: Mood, affect normal  EKG: deferred  ASSESSMENT: Unstable angina versus possible symptomatic aortic stenosis Hypertension-controlled Dyslipidemia Severe aortic stenosis NAFLD GERD DM2 on insulin  PLAN: 1.   Joanna Brown has severe but not critical aortic stenosis.  On exam I can still auscultate a second heart sound.  She denies any worsening chest pain or shortness of breath but has been seen by the multidisciplinary valve clinic and had a CTA to work-up possible TAVR.  She has an upcoming appoint with Dr. Cyndia Bent and they will review options for her.  I think she is likely to need surgery within the next few months.  Its not clear whether her symptoms are related to the valve  or not but very possibly could be.  No medicine changes today.  Follow-up with me in 6 months or sooner as necessary  [name], MD, [facility], [place] Director of the Advanced Lipid Disorders &  Cardiovascular Risk Reduction Clinic Diplomate of the American Board of Clinical Lipidology Attending Cardiologist  Direct Dial: [phone]  Fax: [phone]  Website:  [email or web address]  Nadean Corwin Daryan Buell 03/15/2021, 1:28 PM

## 2021-03-15 NOTE — Patient Instructions (Signed)

## 2021-03-18 ENCOUNTER — Other Ambulatory Visit: Payer: Self-pay

## 2021-03-18 ENCOUNTER — Institutional Professional Consult (permissible substitution) (INDEPENDENT_AMBULATORY_CARE_PROVIDER_SITE_OTHER): Payer: Medicare Other | Admitting: Surgery

## 2021-03-18 ENCOUNTER — Encounter: Payer: Self-pay | Admitting: *Deleted

## 2021-03-18 ENCOUNTER — Encounter: Payer: Self-pay | Admitting: Surgery

## 2021-03-18 VITALS — BP 148/73 | HR 80 | Resp 20 | Ht 65.0 in | Wt 188.0 lb

## 2021-03-18 DIAGNOSIS — I200 Unstable angina: Secondary | ICD-10-CM

## 2021-03-18 DIAGNOSIS — I350 Nonrheumatic aortic (valve) stenosis: Secondary | ICD-10-CM | POA: Diagnosis not present

## 2021-03-18 NOTE — Progress Notes (Signed)
Patient ID: Joanna Brown, female   DOB: 1941-04-02, 80 y.o.   MRN: [id number]  [facility]        [id number]       [place] [address]             [phone]          CARDIOTHORACIC SURGERY CONSULTATION REPORT  PCP is Fayrene Helper, MD Referring Provider is [name] Primary Cardiologist is [name], MD  Reason for consultation: Severe aortic stenosis  HPI:  The patient is a 80 year old woman with history of diabetes, hypertension, hyperlipidemia, mild coronary artery disease, and aortic stenosis who was referred by Dr. [name] for consideration of TAVR.  She had a 2D echo in January 2022 which showed a mean gradient of 32.5 mmHg across aortic valve with peak gradient of 52 mmHg.  The valve area was 0.77 cm.  Left ventricular ejection fraction was 65 to 70%.  She underwent cardiac catheterization in May 2022 showing mild nonobstructive coronary disease with a mean gradient of 27 mmHg across aortic valve and a peak gradient of 30 mmHg.  Right heart filling pressures were normal.  Cardiac output was normal.  There was severe mitral annular calcification.  She underwent repeat echo on 01/20/2021 showing a trileaflet aortic valve with moderate calcification and thickening.  The mean gradient was measured at 25 mmHg with a peak gradient of 44 mmHg.  Aortic valve area was 0.83 cm with a dimensionless index of 0.26 and a stroke-volume index of 30.  Left ventricular ejection fraction was 55 to 60% with grade 1 diastolic dysfunction.  The patient is here today with her husband.  She reports a 79-monthhistory of exertional fatigue and shortness of breath.  This typically happens with walking around the house or doing any lifting.  She has some associated chest tightness.  She occasionally has lower extremity edema in her ankles.  She has orthopnea.  She has had some dizziness with exertion but denies any  syncope.  She is retired from the post [employer].  She has regular dental care.  Past Medical History:  Diagnosis Date   Arthritis    OA   Blood transfusion    1980   Diabetes mellitus    Dysrhythmia    Female bladder prolapse    GERD 11/18/2008   Qualifier: Diagnosis of  By: MCraige Cotta    GERD (gastroesophageal reflux disease)    Glaucoma    Heart disease    Hyperlipidemia    Hypertension    echo and stress 4/10 reports on chart, EKG ` LOV 9/12 on chart   Mild aortic stenosis    Thyroid disease     Past Surgical History:  Procedure Laterality Date   ABDOMINAL HYSTERECTOMY     ANTERIOR AND POSTERIOR REPAIR  04/26/2011   Procedure: ANTERIOR (CYSTOCELE) AND POSTERIOR REPAIR (RECTOCELE);  Surgeon: SReece Packer MD;  Location: [facility];  Service: Urology;  Laterality: N/A;   BIOPSY  05/25/2020   Procedure: BIOPSY;  Surgeon: DDoran Stabler MD;  Location: [facility];  Service: Gastroenterology;;   CATARACT EXTRACTION Bilateral    with IOL   CHOLECYSTECTOMY  2009   COLONOSCOPY N/A 12/02/2013   three colon polyps removed, small internal hemorrhoids. Hyperplastic polyps   COLONOSCOPY N/A 11/20/2019   pancolonic diverticulosis, two 10-11 mm polyps in ascending colon, one 5 mm polyp in cecum, ascending colon  with superficially invasive adenocarcinoma arising in background of sessile serrated polyps with low and high grade cytologic dysplasia.   COLONOSCOPY     COLONOSCOPY W/ POLYPECTOMY     COLONOSCOPY WITH PROPOFOL N/A 05/25/2020   diverticulosis in right colon, redundant colon. Caution warranted on future colonoscopy in light of age, cardiac condition, challenging anatomy.    ESOPHAGOGASTRODUODENOSCOPY (EGD) WITH PROPOFOL N/A 05/25/2020   Grade 1 esophageal varices, single mucosal nodule in stomach s/p biopsy. (hyperplastic)>    LEFT HEART CATH  09/10/2008   normal coronary arteries, normal LV systolic function, EF 83% (Dr. [name])   [place] Right 1997    under arm   NM MYOCAR PERF WALL MOTION  2010   dipyridamole - mild-mod in intenstiy perfusion defect in mid anterior, mid anteroseptal wall, EF 70%   OVARY SURGERY     bilateral tumors removed   POLYPECTOMY  11/20/2019   Procedure: POLYPECTOMY;  Surgeon: Daneil Dolin, MD;  Location: [facility];  Service: Endoscopy;;  hot and cold snare cecal polyp, and asending polyps x 2   RIGHT/LEFT HEART CATH AND CORONARY ANGIOGRAPHY N/A 10/02/2020   Procedure: RIGHT/LEFT HEART CATH AND CORONARY ANGIOGRAPHY;  Surgeon: Martinique, Peter M, MD;  Location: [place] [facility];  Service: Cardiovascular;  Laterality: N/A;   THYROIDECTOMY     THYROIDECTOMY  02/2008   TRANSTHORACIC ECHOCARDIOGRAM  08/2011   EF=>55%, mild conc LVH; trace MR; mild TR; mild-mod AV calcification with mild valvular AV stenosis   VAGINAL PROLAPSE REPAIR  04/26/2011   Procedure: VAGINAL VAULT SUSPENSION;  Surgeon: Reece Packer, MD;  Location: [facility];  Service: Urology;  Laterality: N/A;  with Graft  10x6    Family History  Problem Relation Age of Onset   Stomach cancer Mother 50   Heart disease Mother 80       heart disease   Heart disease Father 6       MI   Stroke Maternal Grandfather    Heart attack Paternal Grandfather    Hypertension Brother    Bone cancer Brother    Hypertension Sister    Liver cancer Sister    Hypertension Sister    Hypertension Child    Colon cancer Neg Hx    Colon polyps Neg Hx    Pancreatic cancer Neg Hx    Esophageal cancer Neg Hx    Rectal cancer Neg Hx     Social History   Socioeconomic History   Marital status: Married    Spouse name: [name]   Number of children: 1   Years of education: Trade   Highest education level: 12th grade  Occupational History   Occupation: Retired   Occupation: Writer  Tobacco Use   Smoking status: Never   Smokeless tobacco: Never  Scientific laboratory technician Use: Never used  Substance and Sexual Activity   Alcohol use: No    Comment:  socially- none x 30 years   Drug use: No   Sexual activity: Yes  Other Topics Concern   Not on file  Social History Narrative   Patient lives at home with spouse. 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 LIVED IN [place] FOR 50 YRS AND CAME BACK TO [place] ~2009. Caffeine Use: 1 cup of coffee daily. HAD ONE CHILD: PASSED 5 YRS. HAVE THREE GRAND-KIDS AND TWO GREAT GRANDS.    Social Determinants of Health   Financial Resource Strain: Low Risk    Difficulty  of Paying Living Expenses: Not hard at all  Food Insecurity: No Food Insecurity   Worried About [place] in the Last Year: Never true   [place] in the Last Year: Never true  Transportation Needs: No Transportation Needs   Lack of Transportation (Medical): No   Lack of Transportation (Non-Medical): No  Physical Activity: Sufficiently Active   Days of Exercise per Week: 5 days   Minutes of Exercise per Session: 30 min  Stress: No Stress Concern Present   Feeling of Stress : Not at all  Social Connections: Moderately Integrated   Frequency of Communication with Friends and Family: More than three times a week   Frequency of Social Gatherings with Friends and Family: Twice a week   Attends Religious Services: More than 4 times per year   Active Member of [employer] or Organizations: No   Attends Archivist Meetings: Never   Marital Status: Married  Human resources officer Violence: Not At Risk   Fear of Current or Ex-Partner: No   Emotionally Abused: No   Physically Abused: No   Sexually Abused: No    Prior to Admission medications   Medication Sig Start Date End Date Taking? Authorizing Provider  Accu-Chek FastClix Lancets MISC 1 each by Other route 2 (two) times daily. 09/14/20  Yes Nida, Marella Chimes, MD  ALPHAGAN P 0.1 % SOLN Place 1 drop into both eyes 2 (two) times daily. 09/16/19  Yes [provider]  amLODipine (NORVASC) 10 MG tablet TAKE 1 TABLET(10 MG) BY MOUTH EVERY MORNING  05/21/20  Yes Fayrene Helper, MD  Ascorbic Acid (VITAMIN C) 1000 MG tablet Take 1,000 mg by mouth 4 (four) times a week. AT NIGHT   Yes [provider]  aspirin EC 81 MG tablet Take 81 mg by mouth every other day. In the morning. Swallow whole.   Yes [provider]  benazepril (LOTENSIN) 40 MG tablet TAKE 1 TABLET(40 MG) BY MOUTH DAILY 05/27/20  Yes Fayrene Helper, MD  bisacodyl (DULCOLAX) 5 MG EC tablet Take 5 mg by mouth daily as needed for moderate constipation.   Yes [provider]  Blood Glucose Monitoring Suppl (ACCU-CHEK GUIDE) w/Device KIT 1 each by Does not apply route 4 (four) times daily. 05/08/19  Yes Cassandria Anger, MD  cholecalciferol (VITAMIN D3) 25 MCG (1000 UT) tablet Take 1,000 Units by mouth in the morning.   Yes [provider]  clotrimazole-betamethasone (LOTRISONE) cream Apply twice daily for 1 week, to affected area , then as needed Patient taking differently: Apply 1 application topically 2 (two) times daily as needed (itching/skin irritation). 04/01/20  Yes Fayrene Helper, MD  ezetimibe (ZETIA) 10 MG tablet Take 1 tablet (10 mg total) by mouth daily. 10/23/20  Yes Almyra Deforest, PA  glipiZIDE (GLUCOTROL XL) 5 MG 24 hr tablet Take 1 tablet (5 mg total) by mouth daily with breakfast. 06/10/20  Yes Nida, Marella Chimes, MD  glucose blood (ACCU-CHEK GUIDE) test strip USE TO CHECK BLOOD SUGAR TWICE DAILY 05/21/20  Yes Nida, Marella Chimes, MD  insulin isophane & regular human KwikPen (HUMULIN 70/30 MIX) (70-30) 100 UNIT/ML KwikPen Inject 60 units with breakfast and 40 units with supper only if glucose is above 90 12/31/20  Yes Nida, Marella Chimes, MD  Insulin Pen Needle (B-D ULTRAFINE III SHORT PEN) 31G X 8 MM MISC USE AS DIRECTED TWICE DAILY WITH INSULIN PENS 02/15/21  Yes Nida, Marella Chimes, MD  latanoprost (XALATAN) 0.005 %  ophthalmic solution Place 1 drop into both eyes at bedtime.  02/18/19  Yes [provider]   linaclotide Rolan Lipa) 290 MCG CAPS capsule Take 1 capsule (290 mcg total) by mouth daily before breakfast. 02/09/21  Yes Annitta Needs, NP  metFORMIN (GLUCOPHAGE) 500 MG tablet TAKE 1 TABLET BY MOUTH EVERY DAY WITH BREAKFAST 10/26/20  Yes Fayrene Helper, MD  Multiple Vitamin (MULTIVITAMIN WITH MINERALS) TABS tablet Take 1 tablet by mouth daily.   Yes [provider]  Omega-3 Fatty Acids (FISH OIL) 1200 MG CAPS Take 1,200 mg by mouth every morning.   Yes [provider]  pantoprazole (PROTONIX) 40 MG tablet Take 1 tablet (40 mg total) by mouth daily. 30 minutes before meal 01/12/21  Yes Fayrene Helper, MD  polyethylene glycol (MIRALAX) 17 g packet Take 17 g by mouth at bedtime. Patient taking differently: Take 17 g by mouth daily. IN THE MORNING 05/05/20  Yes Noralyn Pick, NP  pravastatin (PRAVACHOL) 80 MG tablet Take 1 tablet (80 mg total) by mouth in the morning. 10/23/20  Yes Meng, Isaac Laud, PA  triamterene-hydrochlorothiazide (MAXZIDE) 75-50 MG tablet Take 0.5 tablets by mouth daily. 09/28/20  Yes Hilty, Nadean Corwin, MD    Current Outpatient Medications  Medication Sig Dispense Refill   Accu-Chek FastClix Lancets MISC 1 each by Other route 2 (two) times daily. 102 each 2   ALPHAGAN P 0.1 % SOLN Place 1 drop into both eyes 2 (two) times daily.     amLODipine (NORVASC) 10 MG tablet TAKE 1 TABLET(10 MG) BY MOUTH EVERY MORNING 90 tablet 3   Ascorbic Acid (VITAMIN C) 1000 MG tablet Take 1,000 mg by mouth 4 (four) times a week. AT NIGHT     aspirin EC 81 MG tablet Take 81 mg by mouth every other day. In the morning. Swallow whole.     benazepril (LOTENSIN) 40 MG tablet TAKE 1 TABLET(40 MG) BY MOUTH DAILY 90 tablet 1   bisacodyl (DULCOLAX) 5 MG EC tablet Take 5 mg by mouth daily as needed for moderate constipation.     Blood Glucose Monitoring Suppl (ACCU-CHEK GUIDE) w/Device KIT 1 each by Does not apply route 4 (four) times daily. 1 kit 0   cholecalciferol (VITAMIN D3)  25 MCG (1000 UT) tablet Take 1,000 Units by mouth in the morning.     clotrimazole-betamethasone (LOTRISONE) cream Apply twice daily for 1 week, to affected area , then as needed (Patient taking differently: Apply 1 application topically 2 (two) times daily as needed (itching/skin irritation).) 45 g 0   ezetimibe (ZETIA) 10 MG tablet Take 1 tablet (10 mg total) by mouth daily. 90 tablet 3   glipiZIDE (GLUCOTROL XL) 5 MG 24 hr tablet Take 1 tablet (5 mg total) by mouth daily with breakfast. 90 tablet 1   glucose blood (ACCU-CHEK GUIDE) test strip USE TO CHECK BLOOD SUGAR TWICE DAILY 150 strip 1   insulin isophane & regular human KwikPen (HUMULIN 70/30 MIX) (70-30) 100 UNIT/ML KwikPen Inject 60 units with breakfast and 40 units with supper only if glucose is above 90 60 mL 3   Insulin Pen Needle (B-D ULTRAFINE III SHORT PEN) 31G X 8 MM MISC USE AS DIRECTED TWICE DAILY WITH INSULIN PENS 100 each 5   latanoprost (XALATAN) 0.005 % ophthalmic solution Place 1 drop into both eyes at bedtime.      linaclotide (LINZESS) 290 MCG CAPS capsule Take 1 capsule (290 mcg total) by mouth daily before breakfast. 30 capsule 1  metFORMIN (GLUCOPHAGE) 500 MG tablet TAKE 1 TABLET BY MOUTH EVERY DAY WITH BREAKFAST 60 tablet 2   Multiple Vitamin (MULTIVITAMIN WITH MINERALS) TABS tablet Take 1 tablet by mouth daily.     Omega-3 Fatty Acids (FISH OIL) 1200 MG CAPS Take 1,200 mg by mouth every morning.     pantoprazole (PROTONIX) 40 MG tablet Take 1 tablet (40 mg total) by mouth daily. 30 minutes before meal 90 tablet 1   polyethylene glycol (MIRALAX) 17 g packet Take 17 g by mouth at bedtime. (Patient taking differently: Take 17 g by mouth daily. IN THE MORNING) 14 each 0   pravastatin (PRAVACHOL) 80 MG tablet Take 1 tablet (80 mg total) by mouth in the morning. 90 tablet 3   triamterene-hydrochlorothiazide (MAXZIDE) 75-50 MG tablet Take 0.5 tablets by mouth daily. 45 tablet 0   No current facility-administered medications  for this visit.    Allergies  Allergen Reactions   Senokot Wheat Bran [Wheat Bran]     ABDOMINAL CRAMPS   Spironolactone     Stomach problems, vision changes       Review of Systems:   General:  normal appetite, + decreased energy, + weight gain, no weight loss, no fever  Cardiac:  + chest pain with exertion, no chest pain at rest, +SOB with moderate exertion, no resting SOB, no PND, + orthopnea, no palpitations, no arrhythmia, no atrial fibrillation, + LE edema, + dizzy spells, no syncope  Respiratory:  + exertional shortness of breath, no home oxygen, no productive cough, + dry cough, no bronchitis, no wheezing, no hemoptysis, no asthma, no pain with inspiration or cough, no sleep apnea, no CPAP at night  GI:   no difficulty swallowing, no reflux, + frequent heartburn, no hiatal hernia, no abdominal pain, + constipation, no diarrhea, no hematochezia, no hematemesis, no melena  GU:   no dysuria,  no frequency, no urinary tract infection, no hematuria, no enlarged prostate, no kidney stones, + kidney disease  Vascular:  no pain suggestive of claudication, no pain in feet, + leg cramps, no varicose veins, no DVT, no non-healing foot ulcer  Neuro:   no stroke, no TIA's, no seizures, + headaches, no temporary blindness one eye,  no slurred speech, + peripheral neuropathy, no chronic pain, no instability of gait, no memory/cognitive dysfunction  Musculoskeletal: + arthritis, no joint swelling, no myalgias, no difficulty walking, normal mobility   Skin:   no rash, + itching, no skin infections, no pressure sores or ulcerations  Psych:   no anxiety, no depression, + nervousness, no unusual recent stress  Eyes:   + blurry vision, + floaters, no recent vision changes, + wears glasses  ENT:   no hearing loss, no loose or painful teeth, + dentures, last saw dentist 3 months ago  Hematologic:  no easy bruising, no abnormal bleeding, no clotting disorder, no frequent epistaxis  Endocrine:  + diabetes,  does check CBG's at home     Physical Exam:   BP (!) 148/73   Pulse 80   Resp 20   Ht '5\' 5"'  (1.651 m)   Wt 188 lb (85.3 kg)   SpO2 97% Comment: RA  BMI 31.28 kg/m   General:  well-appearing  HEENT:  Unremarkable, NCAT, PERLA, EOMI  Neck:   no JVD, + bruits or transmitted murmur to neck, no adenopathy   Chest:   clear to auscultation, symmetrical breath sounds, no wheezes, no rhonchi   CV:   RRR, 3/6 systolic murmur RSB, no  diastolic murmur  Abdomen:  soft, non-tender, no masses   Extremities:  warm, well-perfused, pulses palpable at ankle, no lower extremity edema  Rectal/GU  Deferred  Neuro:   Grossly non-focal and symmetrical throughout  Skin:   Clean and dry, no rashes, no breakdown  Diagnostic Tests:  ECHOCARDIOGRAM REPORT         Patient Name:   Joanna Brown Date of Exam: 01/20/2021  Medical Rec #:  [id number]        Height:       65.0 in  Accession #:    [id number]       Weight:       188.0 lb  Date of Birth:  Jun 06, 1940         BSA:          1.927 m  Patient Age:    34 years         BP:           146/81 mmHg  Patient Gender: F                HR:           71 bpm.  Exam Location:  [address]   Procedure: 2D Echo, Cardiac Doppler, Color Doppler and Strain Analysis   Indications:    I35.0 Aortic Stenosis     History:        Patient has prior history of Echocardiogram examinations,  most                  recent 06/04/2020. Risk Factors:Hypertension and HLD.     Sonographer:    Marygrace Drought RCS  Referring Phys: [place]     1. Left ventricular ejection fraction, by estimation, is 55 to 60%. The  left ventricle has normal function. The left ventricle has no regional  wall motion abnormalities. Left ventricular diastolic parameters are  consistent with Grade I diastolic  dysfunction (impaired relaxation). The average left ventricular global  longitudinal strain is -16.1 %. The global longitudinal strain is  borderline reduced.    2. Right ventricular systolic function is normal. The right ventricular  size is normal. The estimated right ventricular systolic pressure is 17.9  mmHg.   3. Left atrial size was mildly dilated.   4. The mitral valve is degenerative. Mild mitral valve regurgitation.  Moderate to severe mitral annular calcification.   5. The aortic valve is tricuspid. There is moderate calcification of the  aortic valve. There is severe thickening of the aortic valve. Aortic valve  regurgitation is not visualized. Moderate to severe aortic valve stenosis.   FINDINGS   Left Ventricle: Left ventricular ejection fraction, by estimation, is 55  to 60%. The left ventricle has normal function. The left ventricle has no  regional wall motion abnormalities. The average left ventricular global  longitudinal strain is -16.1 %.  The global longitudinal strain is abnormal. The left ventricular internal  cavity size was normal in size. There is borderline concentric left  ventricular hypertrophy. Left ventricular diastolic parameters are  consistent with Grade I diastolic dysfunction   (impaired relaxation). Indeterminate filling pressures.   Right Ventricle: The right ventricular size is normal. No increase in  right ventricular wall thickness. Right ventricular systolic function is  normal. The tricuspid regurgitant velocity is 1.52 m/s, and with an  assumed right atrial pressure of 3 mmHg,  the estimated right ventricular systolic pressure is 15.0 mmHg.   Left  Atrium: Left atrial size was mildly dilated.   Right Atrium: Right atrial size was normal in size.   Pericardium: There is no evidence of pericardial effusion.   Mitral Valve: The mitral valve is degenerative in appearance. Moderate to  severe mitral annular calcification. Mild mitral valve regurgitation, with  centrally-directed jet.   Tricuspid Valve: The tricuspid valve is normal in structure. Tricuspid  valve regurgitation is trivial.   Aortic  Valve: The aortic valve is tricuspid. There is moderate  calcification of the aortic valve. There is severe thickening of the  aortic valve. Aortic valve regurgitation is not visualized. Moderate to  severe aortic stenosis is present. Aortic valve  mean gradient measures 25.0 mmHg. Aortic valve peak gradient measures 43.6  mmHg. Aortic valve area, by VTI measures 0.83 cm.   Pulmonic Valve: The pulmonic valve was grossly normal. Pulmonic valve  regurgitation is not visualized.   Aorta: The aortic root is normal in size and structure.   IAS/Shunts: No atrial level shunt detected by color flow Doppler.      LEFT VENTRICLE  PLAX 2D  LVIDd:         4.10 cm  Diastology  LVIDs:         3.00 cm  LV e' medial:    5.44 cm/s  LV PW:         1.10 cm  LV E/e' medial:  15.7  LV IVS:        1.20 cm  LV e' lateral:   8.49 cm/s  LVOT diam:     2.00 cm  LV E/e' lateral: 10.1  LV SV:         57  LV SV Index:   30       2D Longitudinal Strain  LVOT Area:     3.13 cm 2D Strain GLS Avg:     -16.1 %      RIGHT VENTRICLE  RV Basal diam:  3.00 cm  RV S prime:     11.60 cm/s  TAPSE (M-mode): 1.9 cm   LEFT ATRIUM             Index       RIGHT ATRIUM           Index  LA diam:        3.70 cm 1.92 cm/m  RA Area:     11.00 cm  LA Vol (A2C):   58.6 ml 30.41 ml/m RA Volume:   24.10 ml  12.51 ml/m  LA Vol (A4C):   49.0 ml 25.43 ml/m  LA Biplane Vol: 54.4 ml 28.23 ml/m   AORTIC VALVE  AV Area (Vmax):    0.80 cm  AV Area (Vmean):   0.79 cm  AV Area (VTI):     0.83 cm  AV Vmax:           330.00 cm/s  AV Vmean:          229.000 cm/s  AV VTI:            0.693 m  AV Peak Grad:      43.6 mmHg  AV Mean Grad:      25.0 mmHg  LVOT Vmax:         84.90 cm/s  LVOT Vmean:        58.200 cm/s  LVOT VTI:          0.183 m  LVOT/AV VTI ratio: 0.26     AORTA  Ao Root diam: 3.00  cm   MITRAL VALVE                TRICUSPID VALVE  MV Area (PHT):              TR Peak grad:   9.2 mmHg  MV Decel Time:               TR Vmax:        152.00 cm/s  MV E velocity: 85.40 cm/s  MV A velocity: 148.00 cm/s  SHUNTS  MV E/A ratio:  0.58         Systemic VTI:  0.18 m                              Systemic Diam: 2.00 cm   Dani Gobble Croitoru MD  Electronically signed by Sanda Klein MD  Signature Date/Time: 01/20/2021/1:20:58 PM         Final     Physicians  Panel Physicians Referring Physician Case Authorizing Physician  Martinique, Peter M, MD (Primary)     Procedures  RIGHT/LEFT HEART CATH AND CORONARY ANGIOGRAPHY   Conclusion    Prox LAD to Mid LAD lesion is 30% stenosed. Ost Cx to Mid Cx lesion is 20% stenosed. Prox RCA lesion is 25% stenosed. LV end diastolic pressure is normal. There is moderate aortic valve stenosis.   1. Mild nonobstructive CAD 2. Moderate to severe aortic stenosis AV mean gradient 27 mm Hg, peak gradient 30 mm Hg 3. Severe mitral annular calcification 4. Normal right heart and LV filling pressures.  5. Normal cardiac output.    Plan: patient should be considered for TAVR. As valve is not critical this could await resolution of our contrast shortage.    Recommendations  Antiplatelet/Anticoag Recommend Aspirin 38m daily for moderate CAD.   Indications  Nonrheumatic aortic valve stenosis [I35.0 (ICD-10-CM)]   Procedural Details  Technical Details Indication: 80yo BF with moderate to severe AS and chest pain  Procedural Details: The right groin was prepped, draped, and anesthetized with 1% lidocaine. Ultrasound was used to guide access. Image was obtained and stored in the record.  Using the modified Seldinger technique a 5 Fr  sheath was placed in the right femoral artery and a 7 French sheath was placed in the right femoral vein. A Swan-Ganz catheter was used for the right heart catheterization. Standard protocol was followed for recording of right heart pressures and sampling of oxygen saturations. Fick cardiac output was calculated. Standard Judkins catheters were  used for selective coronary angiography and left ventricular pressures. A LA catheter and straight wire were used to cross the AV. There were no immediate procedural complications. Femoral hemostasis was obtained with a Mynx closure device. The patient was transferred to the post catheterization recovery area for further monitoring.  Contrast: 45 cc Estimated blood loss <50 mL.   During this procedure medications were administered to achieve and maintain moderate conscious sedation while the patient's heart rate, blood pressure, and oxygen saturation were continuously monitored and I was present face-to-face 100% of this time.   Medications (Filter: Administrations occurring from 0945 to 1054 on 10/02/20)  important  Continuous medications are totaled by the amount administered until 10/02/20 1054.   Heparin (Porcine) in NaCl 1000-0.9 UT/500ML-% SOLN (mL) Total volume:  1,000 mL Date/Time Rate/Dose/Volume Action   10/02/20 1008 500 mL Given   1009 500 mL Given    midazolam (VERSED) injection (mg) Total dose:  2 mg Date/Time Rate/Dose/Volume Action   10/02/20 1009 2 mg Given    fentaNYL (SUBLIMAZE) injection (mcg) Total dose:  25 mcg Date/Time Rate/Dose/Volume Action   10/02/20 1010 25 mcg Given    lidocaine (PF) (XYLOCAINE) 1 % injection (mL) Total volume:  20 mL Date/Time Rate/Dose/Volume Action   10/02/20 1018 20 mL Given    iohexol (OMNIPAQUE) 350 MG/ML injection (mL) Total volume:  45 mL Date/Time Rate/Dose/Volume Action   10/02/20 1048 45 mL Given    Sedation Time  Sedation Time Physician-1: 31 minutes 42 seconds Contrast  Medication Name Total Dose  iohexol (OMNIPAQUE) 350 MG/ML injection 45 mL   Radiation/Fluoro  Fluoro time: 6.1 (min) DAP: 13614 (mGycm2) Cumulative Air Kerma: 161 (mGy) Complications  Complications documented before study signed (10/02/2020 09:60 AM)   No complications were associated with this study.  Documented by Martinique, Peter M, MD -  10/02/2020 10:55 AM     Coronary Findings  Diagnostic Dominance: Right Left Main  Vessel was injected. Vessel is normal in caliber. Vessel is angiographically normal.  Left Anterior Descending  Prox LAD to Mid LAD lesion is 30% stenosed. The lesion is moderately calcified.  Ramus Intermedius  Vessel was injected. Vessel is normal in caliber. Vessel is angiographically normal.  Left Circumflex  Ost Cx to Mid Cx lesion is 20% stenosed.  Right Coronary Artery  Prox RCA lesion is 25% stenosed. The lesion is moderately calcified.  Intervention  No interventions have been documented. Left Heart  Left Ventricle LV end diastolic pressure is normal.  Mitral Valve The annulus is calcified.  Aortic Valve There is moderate aortic valve stenosis. The aortic valve is calcified. There is abnormal aortic valve motion.   Coronary Diagrams  Diagnostic Dominance: Right Intervention  Implants     Vascular Products  Closure Mynx Control 43f-- [id number]- Implanted Inventory item: [facility] Model/Cat number: [id number] Manufacturer: COcillaLot number: [id number] Device identifier: [id number] identifier type: GS1  GUDID Information  Request status Successful    Brand name: MYNX CONTROL Version/Model: [id number] Company name: ABertramsafety info as of 10/02/20: MR Safe  Contains dry or latex rubber: No    GMDN P.T. name: Wound hydrogel dressing, non-antimicrobial     As of 10/02/2020  Status: Implanted       Syngo Images   Show images for CARDIAC CATHETERIZATION Images on Long Term Storage   Show images for Babe, Ahyana H Link to Procedure Log  Procedure Log    Hemo Data  Flowsheet Row Most Recent Value  Fick Cardiac Output 13.09 L/min  Fick Cardiac Output Index 6.84 (L/min)/BSA  Aortic Mean Gradient 26.65 mmHg  Aortic Peak Gradient 30 mmHg  Aortic Valve Area 3.14  Aortic Value Area Index 1.64 cm2/BSA  RA A Wave 2 mmHg   RA V Wave 1 mmHg  RA Mean 0 mmHg  RV Systolic Pressure 22 mmHg  RV Diastolic Pressure -3 mmHg  RV EDP 1 mmHg  PA Systolic Pressure 23 mmHg  PA Diastolic Pressure 2 mmHg  PA Mean 10 mmHg  PW A Wave 4 mmHg  PW V Wave 5 mmHg  PW Mean 3 mmHg  AO Systolic Pressure 1027mmHg  AO Diastolic Pressure 61 mmHg  AO Mean 87 mmHg  LV Systolic Pressure 1253mmHg  LV Diastolic Pressure -6 mmHg  LV EDP 11 mmHg  AOp Systolic Pressure 1664mmHg  AOp Diastolic Pressure 58 mmHg  AOp Mean Pressure 86 mmHg  LVp Systolic Pressure 277 mmHg  LVp Diastolic Pressure 0 mmHg  LVp EDP Pressure 10 mmHg  QP/QS 1  TPVR Index 1.46 HRUI  TSVR Index 12.72 HRUI  TPVR/TSVR Ratio 0.11    ADDENDUM REPORT: 01/31/2021 13:45   CLINICAL DATA:  Pre-op transcatheter aortic valve replacement (TAVR)   EXAM: Cardiac TAVR CT   TECHNIQUE: The patient was scanned on a Siemens Force 824 slice scanner. A 120 kV retrospective scan was triggered in the descending thoracic aorta at 111 HU's. Gantry rotation speed was 270 msecs and collimation was .9 mm. The 3D data set was reconstructed in 5% intervals of the R-R cycle. Systolic and diastolic phases were analyzed on a dedicated work station using MPR, MIP and VRT modes. The patient received 72m OMNIPAQUE IOHEXOL 350 MG/ML SOLN of contrast.   FINDINGS: Aortic Valve: Tricuspid aortic valve. Severely reduced cusp separation. Moderately thickened, severely calcified aortic valve cusps.   AV calcium score: 1144   Virtual Basal Annulus Measurements:   Maximum/Minimum Diameter: 26 x 21.1 mm   Perimeter: 72.4 mm   Area: 400 mm2   No significant LVOT calcifications.   Based on these measurements, the annulus would be suitable for a 23 mm Sapien 3 valve.   Sinus of Valsalva Measurements:   Non-coronary:  29 mm   Right - coronary:  28 mm   Left - coronary:  30 mm   Sinus of Valsalva Height:   Left: 19.7 mm   Right: 17.1 mm   Aorta: Mild atherosclerosis and  calcifications, conventional 3 vessel branch pattern of aortic arch.   Sinotubular Junction:  28 mm   Ascending Thoracic Aorta:  33 mm   Aortic Arch:  29 mm   Descending Thoracic Aorta:  26 mm   Coronary Artery Height above Annulus:   Left Main: 13.4 mm   Right Coronary: 12.3 mm   Coronary Arteries: Normal coronary origin. Right dominance. The study was performed without use of NTG and insufficient for plaque evaluation. 3 vessel coronary artery calcifications.   Optimum Fluoroscopic Angle for Delivery: LAO 4 CAU 4   Severe mitral annular calcification.   No LA appendage thrombus.   IMPRESSION: 1. Tricuspid aortic valve. Severely reduced cusp separation. Moderately thickened, severely calcified aortic valve cusps.   2.  AV calcium score: 1144   3. Annulus area: 400 mm2, no significant LVOT calcifications. The annulus would be suitable for a 23 mm Sapien 3 valve.   4.  Sufficient coronary artery heights from annulus for deployment.   5. Optimum Fluoroscopic Angle for Delivery: LAO 4 CAU 4   6. Severe mitral annular calcification.     Electronically Signed   By: GCherlynn KaiserM.D.   On: 01/31/2021 13:45         Narrative & Impression  CLINICAL DATA:  80year old female with moderate to severe aortic stenosis. Pre-TAVR intervention planning. History of colon cancer.   EXAM: CT ANGIOGRAPHY CHEST, ABDOMEN AND PELVIS   TECHNIQUE: Multidetector CT imaging through the chest, abdomen and pelvis was performed using the standard protocol during bolus administration of intravenous contrast. Multiplanar reconstructed images and MIPs were obtained and reviewed to evaluate the vascular anatomy.   CONTRAST:  970mOMNIPAQUE IOHEXOL 350 MG/ML SOLN   COMPARISON:  03/23/2020 CT chest, abdomen and pelvis.   FINDINGS: CTA CHEST FINDINGS   Cardiovascular: Mild cardiomegaly. No significant pericardial effusion/thickening. Diffuse thickening and coarse calcification  of the aortic valve. Three-vessel coronary atherosclerosis.  Atherosclerotic nonaneurysmal thoracic aorta. Top-normal caliber main pulmonary artery (3.2 cm diameter). No central pulmonary emboli.   Mediastinum/Nodes: Left hemithyroidectomy. Exophytic heterogeneous density 3.9 cm anterior right thyroid isthmus nodule. Unremarkable esophagus. No pathologically enlarged axillary, mediastinal or hilar lymph nodes.   Lungs/Pleura: No pneumothorax. No pleural effusion. No acute consolidative airspace disease or lung masses. Three scattered tiny solid pulmonary nodules in the lungs bilaterally, largest 3 mm in the anterior left lower lobe (series 9/image 76), all stable since 03/23/2020 and presumably benign. No new significant pulmonary nodules.   Musculoskeletal: No aggressive appearing focal osseous lesions. Moderate thoracic spondylosis.   CTA ABDOMEN AND PELVIS FINDINGS   Hepatobiliary: Normal liver with no liver mass. Cholecystectomy. No biliary ductal dilatation.   Pancreas: Normal, with no mass or duct dilation.   Spleen: Normal size. No mass.   Adrenals/Urinary Tract: Normal adrenals. No hydronephrosis. Subcentimeter hypodense scattered bilateral renal cortical lesions, too small to characterize, requiring no follow-up. No additional contour deforming renal lesions. Normal bladder.   Stomach/Bowel: Normal non-distended stomach. Normal caliber small bowel with no small bowel wall thickening. Normal appendix. No large bowel wall thickening, significant diverticulosis or significant pericolonic fat stranding.   Vascular/Lymphatic: Atherosclerotic nonaneurysmal abdominal aorta. No pathologically enlarged lymph nodes in the abdomen or pelvis.   Reproductive: Status post hysterectomy, with no abnormal findings at the vaginal cuff. No adnexal mass.   Other: No pneumoperitoneum, ascites or focal fluid collection. Tiny 4 mm solid peritoneal nodule anterior to the right lower  lobe (series 8/image 145), stable and presumably benign.   Musculoskeletal: No aggressive appearing focal osseous lesions. Mild lumbar spondylosis.   VASCULAR MEASUREMENTS PERTINENT TO TAVR:   AORTA:   Minimal Aortic Diameter-15.9 x 11.9 mm   Severity of Aortic Calcification-moderate   RIGHT PELVIS:   Right Common Iliac Artery -   Minimal Diameter-10.1 x 7.6 mm   Tortuosity-mild-to-moderate   Calcification-moderate   Right External Iliac Artery -   Minimal Diameter-8.3 x 8.1 mm   Tortuosity-mild-to-moderate   Calcification-mild   Right Common Femoral Artery -   Minimal Diameter-8.2 x 7.2 mm   Tortuosity-mild   Calcification-moderate   LEFT PELVIS:   Left Common Iliac Artery -   Minimal Diameter-9.0 x 8.9 mm   Tortuosity-mild   Calcification-severe   Left External Iliac Artery -   Minimal Diameter-8.5 x 8.4 mm   Tortuosity-mild   Calcification-mild   Left Common Femoral Artery -   Minimal Diameter-8.1 x 7.6 mm   Tortuosity-mild   Calcification-moderate   Review of the MIP images confirms the above findings.   IMPRESSION: 1. Vascular findings and measurements pertinent to potential TAVR procedure, as detailed. 2. Diffusely thickened and calcified aortic valve, compatible with the reported history of severe aortic stenosis. 3. Mild cardiomegaly. 4. Exophytic heterogeneous 3.9 cm anterior right thyroid isthmus nodule. This has been evaluated on previous imaging. (ref: J Am Coll Radiol. 2015 Feb;12(2): 143-50). 5. No findings suspicious for metastatic disease. Tiny bilateral pulmonary nodules and right upper quadrant perihepatic peritoneal nodule are all stable and presumably benign. 6.  Aortic Atherosclerosis (ICD10-I70.0).     Electronically Signed   By: Ilona Sorrel M.D.   On: 01/28/2021 13:44      STS Risk Score: Risk of Mortality: 2.049% Renal Failure: 1.975% Permanent Stroke: 1.607% Prolonged Ventilation: 6.038% DSW  Infection: 0.106% Reoperation: 2.643% Morbidity or Mortality: 10.109% Short Length of Stay: 35.149% Long Length of Stay: 4.766%   Impression:  This 80 year old woman has stage D, severe, symptomatic,  low-flow/low gradient aortic stenosis with New York Heart Association class II symptoms of exertional fatigue and shortness of breath consistent with chronic diastolic congestive heart failure.  She has also had exertional dizziness and chest tightness.  I have personally reviewed her 2D echocardiogram, cardiac catheterization, and CTA studies.  Echocardiogram shows a trileaflet aortic valve with moderate calcification and thickening of the leaflets with restricted mobility.  The mean gradient was measured at 25 mmHg with a valve area of 0.3 cm.  Left ventricular systolic function was normal.  Her prior echocardiogram in January 2022 measured a mean aortic valve gradient of 32.5 mmHg with a valve area of 0.77 cm.  Cardiac catheterization shows mild nonobstructive coronary disease.  The mean gradient was measured at 27 mmHg with a peak gradient of 30 mmHg.  I agree that aortic valve replacement is indicated in this patient for relief of her symptoms and to prevent progressive left ventricular deterioration.  Her gated cardiac CTA shows anatomy suitable for TAVR using a 23 mm SAPIEN 3 valve.  The aortic valve appears trileaflet with severely reduced cusp separation with moderately thickened and severely calcified leaflets.  There is severe mitral annular calcification.  Given her age I think that transcatheter aortic valve replacement would be the best treatment for her.  Her abdominal and pelvic CTA shows adequate pelvic vascular anatomy to allow transfemoral insertion.  The patient and her husband were counseled at length regarding treatment alternatives for management of severe symptomatic aortic stenosis. The risks and benefits of surgical intervention has been discussed in detail. Long-term  prognosis with medical therapy was discussed. Alternative approaches such as conventional surgical aortic valve replacement, transcatheter aortic valve replacement, and palliative medical therapy were compared and contrasted at length. This discussion was placed in the context of the patient's own specific clinical presentation and past medical history. All of their questions have been addressed.   Following the decision to proceed with transcatheter aortic valve replacement, a discussion was held regarding what types of management strategies would be attempted intraoperatively in the event of life-threatening complications, including whether or not the patient would be considered a candidate for the use of cardiopulmonary bypass and/or conversion to open sternotomy for attempted surgical intervention.  She is in overall good condition for her age and I think she would be a candidate for emergent sternotomy to manage any intraoperative complications. The patient is aware of the fact that transient use of cardiopulmonary bypass may be necessary. The patient has been advised of a variety of complications that might develop including but not limited to risks of death, stroke, paravalvular leak, aortic dissection or other major vascular complications, aortic annulus rupture, device embolization, cardiac rupture or perforation, mitral regurgitation, acute myocardial infarction, arrhythmia, heart block or bradycardia requiring permanent pacemaker placement, congestive heart failure, respiratory failure, renal failure, pneumonia, infection, other late complications related to structural valve deterioration or migration, or other complications that might ultimately cause a temporary or permanent loss of functional independence or other long term morbidity. The patient provides full informed consent for the procedure as described and all questions were answered.      Plan:  She will be scheduled for transfemoral  transcatheter aortic valve replacement using a SAPIEN 3 valve on 03/23/2021.  I spent 60 minutes performing this consultation and > 50% of this time was spent face to face counseling and coordinating the care of this patient's severe aortic stenosis.   Gaye Pollack, MD 03/18/2021 2:11 PM

## 2021-03-19 ENCOUNTER — Other Ambulatory Visit: Payer: Self-pay | Admitting: Physician Assistant

## 2021-03-19 DIAGNOSIS — I350 Nonrheumatic aortic (valve) stenosis: Secondary | ICD-10-CM

## 2021-03-19 DIAGNOSIS — R0602 Shortness of breath: Secondary | ICD-10-CM

## 2021-03-19 NOTE — Pre-Procedure Instructions (Signed)
Surgical Instructions    Your procedure is scheduled on Tuesday, November 8th.  Report to [facility] Main Entrance "A" at 10:15 A.M., then check in with the Admitting office.  Call this number if you have problems the morning of surgery:  [phone]   If you have any questions prior to your surgery date call [phone]: Open Monday-Friday 8am-4pm    Remember:  Do not eat or drink after midnight the night before your surgery     Please continue taking all of your current medications through the day before surgery. On the day of surgery take no medications  As of today, STOP taking any Aleve, Naproxen, Ibuprofen, Motrin, Advil, Goody's, BC's, all herbal medications, fish oil, and all vitamins.  WHAT DO I DO ABOUT MY DIABETES MEDICATION?   Do not take glipiZIDE (GLUCOTROL XL) or  metFORMIN (GLUCOPHAGE) the morning of surgery.  THE DAY BEFORE SURGERY (11/7): Take usual AM dose of (HUMULIN 70/30 MIX) Take 28 units (70%) of PM dose of (HUMULIN 70/30 MIX) if your glucose is above 90      THE MORNING OF SURGERY (11/8)  do not  take (HUMULIN 70/30 MIX)   The day of surgery, do not take other diabetes injectables, including Byetta (exenatide), Bydureon (exenatide ER), Victoza (liraglutide), or Trulicity (dulaglutide).  If your CBG is greater than 220 mg/dL, you may take  of your sliding scale (correction) dose of insulin.   HOW TO MANAGE YOUR DIABETES BEFORE AND AFTER SURGERY  Why is it important to control my blood sugar before and after surgery? Improving blood sugar levels before and after surgery helps healing and can limit problems. A way of improving blood sugar control is eating a healthy diet by:  Eating less sugar and carbohydrates  Increasing activity/exercise  Talking with your doctor about reaching your blood sugar goals High blood sugars (greater than 180 mg/dL) can raise your risk of infections and slow your recovery, so you will need to focus on controlling your  diabetes during the weeks before surgery. Make sure that the doctor who takes care of your diabetes knows about your planned surgery including the date and location.  How do I manage my blood sugar before surgery? Check your blood sugar at least 4 times a day, starting 2 days before surgery, to make sure that the level is not too high or low.  Check your blood sugar the morning of your surgery when you wake up and every 2 hours until you get to the Short Stay unit.  If your blood sugar is less than 70 mg/dL, you will need to treat for low blood sugar: Do not take insulin. Treat a low blood sugar (less than 70 mg/dL) with  cup of clear juice (cranberry or apple), 4 glucose tablets, OR glucose gel. Recheck blood sugar in 15 minutes after treatment (to make sure it is greater than 70 mg/dL). If your blood sugar is not greater than 70 mg/dL on recheck, call [phone] for further instructions. Report your blood sugar to the short stay nurse when you get to Short Stay.  If you are admitted to the hospital after surgery: Your blood sugar will be checked by the staff and you will probably be given insulin after surgery (instead of oral diabetes medicines) to make sure you have good blood sugar levels. The goal for blood sugar control after surgery is 80-180 mg/dL.  Do NOT Smoke (Tobacco/Vaping) or drink Alcohol 24 hours prior to your procedure.  If you use a CPAP at night, you may bring all equipment for your overnight stay.   Contacts, glasses, piercing's, hearing aid's, dentures or partials may not be worn into surgery, please bring cases for these belongings.    For patients admitted to the hospital, discharge time will be determined by your treatment team.   Patients discharged the day of surgery will not be allowed to drive home, and someone needs to stay with them for 24 hours.  NO VISITORS WILL BE ALLOWED IN PRE-OP WHERE PATIENTS GET READY FOR SURGERY.  ONLY 1  SUPPORT PERSON MAY BE PRESENT IN THE WAITING ROOM WHILE YOU ARE IN SURGERY.  IF YOU ARE TO BE ADMITTED, ONCE YOU ARE IN YOUR ROOM YOU WILL BE ALLOWED TWO (2) VISITORS.  Minor children may have two parents present. Special consideration for safety and communication needs will be reviewed on a case by case basis.   Special instructions:   [facility]- Preparing For Surgery  Before surgery, you can play an important role. Because skin is not sterile, your skin needs to be as free of germs as possible. You can reduce the number of germs on your skin by washing with CHG (chlorahexidine gluconate) Soap before surgery.  CHG is an antiseptic cleaner which kills germs and bonds with the skin to continue killing germs even after washing.    Oral Hygiene is also important to reduce your risk of infection.  Remember - BRUSH YOUR TEETH THE MORNING OF SURGERY WITH YOUR REGULAR TOOTHPASTE  Please do not use if you have an allergy to CHG or antibacterial soaps. If your skin becomes reddened/irritated stop using the CHG.  Do not shave (including legs and underarms) for at least 48 hours prior to first CHG shower. It is OK to shave your face.  Please follow these instructions carefully.   Shower the NIGHT BEFORE SURGERY and the MORNING OF SURGERY  If you chose to wash your hair, wash your hair first as usual with your normal shampoo.  After you shampoo, rinse your hair and body thoroughly to remove the shampoo.  Use CHG Soap as you would any other liquid soap. You can apply CHG directly to the skin and wash gently with a scrungie or a clean washcloth.   Apply the CHG Soap to your body ONLY FROM THE NECK DOWN.  Do not use on open wounds or open sores. Avoid contact with your eyes, ears, mouth and genitals (private parts). Wash Face and genitals (private parts)  with your normal soap.   Wash thoroughly, paying special attention to the area where your surgery will be performed.  Thoroughly rinse your body with  warm water from the neck down.  DO NOT shower/wash with your normal soap after using and rinsing off the CHG Soap.  Pat yourself dry with a CLEAN TOWEL.  Wear CLEAN PAJAMAS to bed the night before surgery  Place CLEAN SHEETS on your bed the night before your surgery  DO NOT SLEEP WITH PETS.   Day of Surgery: Shower with CHG soap. Do not wear jewelry, make up, nail polish, gel polish, artificial nails, or any other type of covering on natural nails including finger and toenails. If patients have artificial nails, gel coating, etc. that need to be removed by a nail salon please have this removed prior to surgery. Surgery may need to be canceled/delayed if the surgeon/ anesthesia feels like  the patient is unable to be adequately monitored. Do not wear lotions, powders, perfumes, or deodorant. Do not shave 48 hours prior to surgery.   Do not bring valuables to the hospital. [facility] is not responsible for any belongings or valuables. Wear Clean/Comfortable clothing the morning of surgery Remember to brush your teeth WITH YOUR REGULAR TOOTHPASTE.   Please read over the following fact sheets that you were given.   3 days prior to your procedure or After your COVID test   You are not required to quarantine however you are required to wear a well-fitting mask when you are out and around people not in your household. If your mask becomes wet or soiled, replace with a new one.   Wash your hands often with soap and water for 20 seconds or clean your hands with an alcohol-based hand sanitizer that contains at least 60% alcohol.   Do not share personal items.   Notify your provider:  o if you are in close contact with someone who has COVID  o or if you develop a fever of 100.4 or greater, sneezing, cough, sore throat, shortness of breath or body aches.

## 2021-03-22 ENCOUNTER — Ambulatory Visit: Payer: Medicare Other | Attending: Family Medicine

## 2021-03-22 ENCOUNTER — Ambulatory Visit (HOSPITAL_COMMUNITY)
Admission: RE | Admit: 2021-03-22 | Discharge: 2021-03-22 | Disposition: A | Discharge status: Home / Self Care | Payer: Medicare Other | Source: Ambulatory Visit | Attending: Physician Assistant | Admitting: Physician Assistant

## 2021-03-22 ENCOUNTER — Other Ambulatory Visit: Payer: Self-pay

## 2021-03-22 ENCOUNTER — Encounter (HOSPITAL_COMMUNITY)
Admission: RE | Admit: 2021-03-22 | Discharge: 2021-03-22 | Disposition: A | Discharge status: Home / Self Care | Payer: Medicare Other | Source: Ambulatory Visit | Attending: Cardiovascular Disease | Admitting: Cardiovascular Disease

## 2021-03-22 ENCOUNTER — Encounter (HOSPITAL_COMMUNITY): Payer: Self-pay

## 2021-03-22 ENCOUNTER — Encounter: Payer: Medicare Other | Admitting: Surgery

## 2021-03-22 VITALS — BP 130/70 | HR 76 | Temp 98.4°F | Resp 18 | Ht 65.0 in | Wt 187.5 lb

## 2021-03-22 DIAGNOSIS — I129 Hypertensive chronic kidney disease with stage 1 through stage 4 chronic kidney disease, or unspecified chronic kidney disease: Secondary | ICD-10-CM | POA: Diagnosis not present

## 2021-03-22 DIAGNOSIS — I700 Atherosclerosis of aorta: Secondary | ICD-10-CM | POA: Insufficient documentation

## 2021-03-22 DIAGNOSIS — R262 Difficulty in walking, not elsewhere classified: Secondary | ICD-10-CM | POA: Insufficient documentation

## 2021-03-22 DIAGNOSIS — N181 Chronic kidney disease, stage 1: Secondary | ICD-10-CM | POA: Insufficient documentation

## 2021-03-22 DIAGNOSIS — E785 Hyperlipidemia, unspecified: Secondary | ICD-10-CM | POA: Diagnosis not present

## 2021-03-22 DIAGNOSIS — Z01818 Encounter for other preprocedural examination: Secondary | ICD-10-CM | POA: Insufficient documentation

## 2021-03-22 DIAGNOSIS — E663 Overweight: Secondary | ICD-10-CM | POA: Diagnosis not present

## 2021-03-22 DIAGNOSIS — I350 Nonrheumatic aortic (valve) stenosis: Secondary | ICD-10-CM

## 2021-03-22 DIAGNOSIS — E890 Postprocedural hypothyroidism: Secondary | ICD-10-CM | POA: Diagnosis not present

## 2021-03-22 DIAGNOSIS — E1122 Type 2 diabetes mellitus with diabetic chronic kidney disease: Secondary | ICD-10-CM | POA: Insufficient documentation

## 2021-03-22 DIAGNOSIS — Z006 Encounter for examination for normal comparison and control in clinical research program: Secondary | ICD-10-CM | POA: Diagnosis not present

## 2021-03-22 DIAGNOSIS — Z20822 Contact with and (suspected) exposure to covid-19: Secondary | ICD-10-CM | POA: Diagnosis not present

## 2021-03-22 DIAGNOSIS — R0602 Shortness of breath: Secondary | ICD-10-CM

## 2021-03-22 DIAGNOSIS — Z794 Long term (current) use of insulin: Secondary | ICD-10-CM | POA: Insufficient documentation

## 2021-03-22 DIAGNOSIS — Z6831 Body mass index (BMI) 31.0-31.9, adult: Secondary | ICD-10-CM | POA: Diagnosis not present

## 2021-03-22 DIAGNOSIS — I2510 Atherosclerotic heart disease of native coronary artery without angina pectoris: Secondary | ICD-10-CM | POA: Diagnosis not present

## 2021-03-22 DIAGNOSIS — K219 Gastro-esophageal reflux disease without esophagitis: Secondary | ICD-10-CM | POA: Diagnosis not present

## 2021-03-22 DIAGNOSIS — Z01811 Encounter for preprocedural respiratory examination: Secondary | ICD-10-CM

## 2021-03-22 HISTORY — DX: Cardiac murmur, unspecified: R01.1

## 2021-03-22 LAB — CBC
HCT: 38.1 % (ref 36.0–46.0)
Hemoglobin: 12.7 g/dL (ref 12.0–15.0)
MCH: 28.7 pg (ref 26.0–34.0)
MCHC: 33.3 g/dL (ref 30.0–36.0)
MCV: 86.2 fL (ref 80.0–100.0)
Platelets: 201 10*3/uL (ref 150–400)
RBC: 4.42 MIL/uL (ref 3.87–5.11)
RDW: 13.2 % (ref 11.5–15.5)
WBC: 7.8 10*3/uL (ref 4.0–10.5)
nRBC: 0 % (ref 0.0–0.2)

## 2021-03-22 LAB — TYPE AND SCREEN
ABO/RH(D): A POS
Antibody Screen: NEGATIVE

## 2021-03-22 LAB — BLOOD GAS, ARTERIAL
Acid-Base Excess: 1.1 mmol/L (ref 0.0–2.0)
Bicarbonate: 24.8 mmol/L (ref 20.0–28.0)
FIO2: 21
O2 Saturation: 98.2 %
Patient temperature: 37
pCO2 arterial: 37.4 mmHg (ref 32.0–48.0)
pH, Arterial: 7.437 (ref 7.350–7.450)
pO2, Arterial: 102 mmHg (ref 83.0–108.0)

## 2021-03-22 LAB — BRAIN NATRIURETIC PEPTIDE: B Natriuretic Peptide: 16.2 pg/mL (ref 0.0–100.0)

## 2021-03-22 LAB — URINALYSIS, ROUTINE W REFLEX MICROSCOPIC
Bilirubin Urine: NEGATIVE
Glucose, UA: NEGATIVE mg/dL
Hgb urine dipstick: NEGATIVE
Ketones, ur: NEGATIVE mg/dL
Leukocytes,Ua: NEGATIVE
Nitrite: NEGATIVE
Protein, ur: NEGATIVE mg/dL
Specific Gravity, Urine: 1.005 (ref 1.005–1.030)
pH: 7 (ref 5.0–8.0)

## 2021-03-22 LAB — HEMOGLOBIN A1C
Hgb A1c MFr Bld: 7.4 % — ABNORMAL HIGH (ref 4.8–5.6)
Mean Plasma Glucose: 165.68 mg/dL

## 2021-03-22 LAB — COMPREHENSIVE METABOLIC PANEL
ALT: 22 U/L (ref 0–44)
AST: 26 U/L (ref 15–41)
Albumin: 3.7 g/dL (ref 3.5–5.0)
Alkaline Phosphatase: 47 U/L (ref 38–126)
Anion gap: 13 (ref 5–15)
BUN: 18 mg/dL (ref 8–23)
CO2: 21 mmol/L — ABNORMAL LOW (ref 22–32)
Calcium: 10 mg/dL (ref 8.9–10.3)
Chloride: 100 mmol/L (ref 98–111)
Creatinine, Ser: 1.07 mg/dL — ABNORMAL HIGH (ref 0.44–1.00)
GFR, Estimated: 53 mL/min — ABNORMAL LOW (ref >60)
Glucose, Bld: 105 mg/dL — ABNORMAL HIGH (ref 70–99)
Potassium: 3.3 mmol/L — ABNORMAL LOW (ref 3.5–5.1)
Sodium: 134 mmol/L — ABNORMAL LOW (ref 135–145)
Total Bilirubin: 0.8 mg/dL (ref 0.3–1.2)
Total Protein: 7.4 g/dL (ref 6.5–8.1)

## 2021-03-22 LAB — GLUCOSE, CAPILLARY: Glucose-Capillary: 154 mg/dL — ABNORMAL HIGH (ref 70–99)

## 2021-03-22 LAB — PROTIME-INR
INR: 1.1 (ref 0.8–1.2)
Prothrombin Time: 13.9 seconds (ref 11.4–15.2)

## 2021-03-22 LAB — SARS CORONAVIRUS 2 (TAT 6-24 HRS): SARS Coronavirus 2: NEGATIVE

## 2021-03-22 LAB — SURGICAL PCR SCREEN
MRSA, PCR: NEGATIVE
Staphylococcus aureus: NEGATIVE

## 2021-03-22 MED ORDER — POTASSIUM CHLORIDE 2 MEQ/ML IV SOLN
80.0000 meq | INTRAVENOUS | Status: DC
  Filled 2021-03-22: qty 40

## 2021-03-22 MED ORDER — CEFAZOLIN SODIUM-DEXTROSE 2-4 GM/100ML-% IV SOLN
2.0000 g | INTRAVENOUS | Status: AC
  Administered 2021-03-23: 2 g via INTRAVENOUS
  Filled 2021-03-22 (×2): qty 100

## 2021-03-22 MED ORDER — HEPARIN 30,000 UNITS/1000 ML (OHS) CELLSAVER SOLUTION
Status: DC
  Filled 2021-03-22: qty 1000

## 2021-03-22 MED ORDER — MAGNESIUM SULFATE 50 % IJ SOLN
40.0000 meq | INTRAMUSCULAR | Status: DC
  Filled 2021-03-22: qty 9.85

## 2021-03-22 MED ORDER — NOREPINEPHRINE 4 MG/250ML-% IV SOLN
0.0000 ug/min | INTRAVENOUS | Status: AC
  Administered 2021-03-23 (×2): 2 ug/min via INTRAVENOUS
  Filled 2021-03-22: qty 250

## 2021-03-22 MED ORDER — DEXMEDETOMIDINE HCL IN NACL 400 MCG/100ML IV SOLN
0.1000 ug/kg/h | INTRAVENOUS | Status: AC
  Administered 2021-03-23: 1 ug/kg/h via INTRAVENOUS
  Administered 2021-03-23: 85.32 ug via INTRAVENOUS
  Filled 2021-03-22 (×2): qty 100

## 2021-03-22 NOTE — H&P (Signed)
[address]       [name] [address]             [phone]      Cardiothoracic Surgery Admission History and Physical   PCP is Moshe Cipro [place]wood Levo, MD  Referring Provider is [name]  Primary Cardiologist is [name], MD   Reason for admission: Severe aortic stenosis   HPI:  The patient is a 80 year old woman with history of diabetes, hypertension, hyperlipidemia, mild coronary artery disease, and aortic stenosis who was referred by Dr. [name] for consideration of TAVR. She had a 2D echo in January 2022 which showed a mean gradient of 32.5 mmHg across aortic valve with peak gradient of 52 mmHg. The valve area was 0.77 cm. Left ventricular ejection fraction was 65 to 70%. She underwent cardiac catheterization in May 2022 showing mild nonobstructive coronary disease with a mean gradient of 27 mmHg across aortic valve and a peak gradient of 30 mmHg. Right heart filling pressures were normal. Cardiac output was normal. There was severe mitral annular calcification. She underwent repeat echo on 01/20/2021 showing a trileaflet aortic valve with moderate calcification and thickening. The mean gradient was measured at 25 mmHg with a peak gradient of 44 mmHg. Aortic valve area was 0.83 cm with a dimensionless index of 0.26 and a stroke-volume index of 30. Left ventricular ejection fraction was 55 to 60% with grade 1 diastolic dysfunction.  The patient lives with her husband. She reports a 67-monthhistory of exertional fatigue and shortness of breath. This typically happens with walking around the house or doing any lifting. She has some associated chest tightness. She occasionally has lower extremity edema in her ankles. She has orthopnea. She has had some dizziness with exertion but denies any syncope. She is retired from the post office. She has regular dental care.      Past Medical History:  Diagnosis Date   Arthritis    OA   Blood transfusion     1980   Diabetes mellitus    Dysrhythmia    Female bladder prolapse    GERD 11/18/2008   Qualifier: Diagnosis of By: MCraige Cotta   GERD (gastroesophageal reflux disease)    Glaucoma    Heart disease    Hyperlipidemia    Hypertension    echo and stress 4/10 reports on chart, EKG ` LOV 9/12 on chart   Mild aortic stenosis    Thyroid disease         Past Surgical History:  Procedure Laterality Date   ABDOMINAL HYSTERECTOMY     ANTERIOR AND POSTERIOR REPAIR  04/26/2011   Procedure: ANTERIOR (CYSTOCELE) AND POSTERIOR REPAIR (RECTOCELE); Surgeon: SReece Packer MD; Location: [facility]; Service: Urology; Laterality: N/A;   BIOPSY  05/25/2020   Procedure: BIOPSY; Surgeon: DDoran Stabler MD; Location: [facility]; Service: Gastroenterology;;   CATARACT EXTRACTION Bilateral    with IOL   CHOLECYSTECTOMY  2009   COLONOSCOPY N/A 12/02/2013   three colon polyps removed, small internal hemorrhoids. Hyperplastic polyps   COLONOSCOPY N/A 11/20/2019   pancolonic diverticulosis, two 10-11 mm polyps in ascending colon, one 5 mm polyp in cecum, ascending colon with superficially invasive adenocarcinoma arising in background of sessile serrated polyps with low and high grade cytologic dysplasia.   COLONOSCOPY     COLONOSCOPY W/ POLYPECTOMY     COLONOSCOPY WITH PROPOFOL N/A 05/25/2020   diverticulosis in right colon, redundant colon. Caution warranted on  future colonoscopy in light of age, cardiac condition, challenging anatomy.    ESOPHAGOGASTRODUODENOSCOPY (EGD) WITH PROPOFOL N/A 05/25/2020   Grade 1 esophageal varices, single mucosal nodule in stomach s/p biopsy. (hyperplastic)>    LEFT HEART CATH  09/10/2008   normal coronary arteries, normal LV systolic function, EF 02% (Dr. [name])   [place] Right 1997   under arm   NM MYOCAR PERF WALL MOTION  2010   dipyridamole - mild-mod in intenstiy perfusion defect in mid anterior, mid anteroseptal wall, EF 70%   OVARY SURGERY      bilateral tumors removed   POLYPECTOMY  11/20/2019   Procedure: POLYPECTOMY; Surgeon: Daneil Dolin, MD; Location: [facility]; Service: Endoscopy;; hot and cold snare cecal polyp, and asending polyps x 2   RIGHT/LEFT HEART CATH AND CORONARY ANGIOGRAPHY N/A 10/02/2020   Procedure: RIGHT/LEFT HEART CATH AND CORONARY ANGIOGRAPHY; Surgeon: Martinique, Peter M, MD; Location: [facility]; Service: Cardiovascular; Laterality: N/A;   THYROIDECTOMY     THYROIDECTOMY  02/2008   TRANSTHORACIC ECHOCARDIOGRAM  08/2011   EF=>55%, mild conc LVH; trace MR; mild TR; mild-mod AV calcification with mild valvular AV stenosis   VAGINAL PROLAPSE REPAIR  04/26/2011   Procedure: VAGINAL VAULT SUSPENSION; Surgeon: Reece Packer, MD; Location: [facility]; Service: Urology; Laterality: N/A; with Graft 10x6        Family History  Problem Relation Age of Onset   Stomach cancer Mother 26   Heart disease Mother 65   heart disease   Heart disease Father 73   MI   Stroke Maternal Grandfather    Heart attack Paternal Grandfather    Hypertension Brother    Bone cancer Brother    Hypertension Sister    Liver cancer Sister    Hypertension Sister    Hypertension Child    Colon cancer Neg Hx    Colon polyps Neg Hx    Pancreatic cancer Neg Hx    Esophageal cancer Neg Hx    Rectal cancer Neg Hx    Social History        Socioeconomic History   Marital status: Married    Spouse name: [name]   Number of children: 1   Years of education: Trade   Highest education level: 12th grade  Occupational History   Occupation: Retired   Occupation: Writer  Tobacco Use   Smoking status: Never   Smokeless tobacco: Never  Scientific laboratory technician Use: Never used  Substance and Sexual Activity   Alcohol use: No    Comment: socially- none x 30 years   Drug use: No   Sexual activity: Yes  Other Topics Concern   Not on file  Social History Narrative   Patient lives at home with spouse. 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 LIVED IN [place] FOR 50 YRS AND CAME BACK TO [place] ~2009. Caffeine Use: 1 cup of coffee daily. HAD ONE CHILD: PASSED 5 YRS. HAVE THREE GRAND-KIDS AND TWO GREAT GRANDS.    Social Determinants of Health      Financial Resource Strain: Low Risk    Difficulty of Paying Living Expenses: Not hard at all  Food Insecurity: No Food Insecurity   Worried About Charity fundraiser in the Last Year: Never true   [place] in the Last Year: Never true  Transportation Needs: No Transportation Needs   Lack of Transportation (Medical): No   Lack of Transportation (Non-Medical):  No  Physical Activity: Sufficiently Active   Days of Exercise per Week: 5 days   Minutes of Exercise per Session: 30 min  Stress: No Stress Concern Present   Feeling of Stress : Not at all  Social Connections: Moderately Integrated   Frequency of Communication with Friends and Family: More than three times a week   Frequency of Social Gatherings with Friends and Family: Twice a week   Attends Religious Services: More than 4 times per year   Active Member of [employer] or Organizations: No   Attends Archivist Meetings: Never   Marital Status: Married  Human resources officer Violence: Not At Risk   Fear of Current or Ex-Partner: No   Emotionally Abused: No   Physically Abused: No   Sexually Abused: No          Prior to Admission medications   Medication Sig Start Date End Date Taking? Authorizing Provider  Accu-Chek FastClix Lancets MISC 1 each by Other route 2 (two) times daily. 09/14/20  Yes Nida, Marella Chimes, MD  ALPHAGAN P 0.1 % SOLN Place 1 drop into both eyes 2 (two) times daily. 09/16/19  Yes [provider]  amLODipine (NORVASC) 10 MG tablet TAKE 1 TABLET(10 MG) BY MOUTH EVERY MORNING 05/21/20  Yes Fayrene Helper, MD  Ascorbic Acid (VITAMIN C) 1000 MG tablet Take 1,000 mg by mouth 4 (four) times a week. AT NIGHT   Yes [provider]  aspirin EC  81 MG tablet Take 81 mg by mouth every other day. In the morning. Swallow whole.   Yes [provider]  benazepril (LOTENSIN) 40 MG tablet TAKE 1 TABLET(40 MG) BY MOUTH DAILY 05/27/20  Yes Fayrene Helper, MD  bisacodyl (DULCOLAX) 5 MG EC tablet Take 5 mg by mouth daily as needed for moderate constipation.   Yes [provider]  Blood Glucose Monitoring Suppl (ACCU-CHEK GUIDE) w/Device KIT 1 each by Does not apply route 4 (four) times daily. 05/08/19  Yes Cassandria Anger, MD  cholecalciferol (VITAMIN D3) 25 MCG (1000 UT) tablet Take 1,000 Units by mouth in the morning.   Yes [provider]  clotrimazole-betamethasone (LOTRISONE) cream Apply twice daily for 1 week, to affected area , then as needed  Patient taking differently: Apply 1 application topically 2 (two) times daily as needed (itching/skin irritation). 04/01/20  Yes Fayrene Helper, MD  ezetimibe (ZETIA) 10 MG tablet Take 1 tablet (10 mg total) by mouth daily. 10/23/20  Yes Almyra Deforest, PA  glipiZIDE (GLUCOTROL XL) 5 MG 24 hr tablet Take 1 tablet (5 mg total) by mouth daily with breakfast. 06/10/20  Yes Nida, Marella Chimes, MD  glucose blood (ACCU-CHEK GUIDE) test strip USE TO CHECK BLOOD SUGAR TWICE DAILY 05/21/20  Yes Nida, Marella Chimes, MD  insulin isophane & regular human KwikPen (HUMULIN 70/30 MIX) (70-30) 100 UNIT/ML KwikPen Inject 60 units with breakfast and 40 units with supper only if glucose is above 90 12/31/20  Yes Nida, Marella Chimes, MD  Insulin Pen Needle (B-D ULTRAFINE III Brown PEN) 31G X 8 MM MISC USE AS DIRECTED TWICE DAILY WITH INSULIN PENS 02/15/21  Yes Nida, Marella Chimes, MD  latanoprost (XALATAN) 0.005 % ophthalmic solution Place 1 drop into both eyes at bedtime.  02/18/19  Yes [provider]  linaclotide Rolan Lipa) 290 MCG CAPS capsule Take 1 capsule (290 mcg total) by mouth daily before breakfast. 02/09/21  Yes Annitta Needs, NP  metFORMIN (GLUCOPHAGE) 500 MG tablet  TAKE 1  TABLET BY MOUTH EVERY DAY WITH BREAKFAST 10/26/20  Yes Fayrene Helper, MD  Multiple Vitamin (MULTIVITAMIN WITH MINERALS) TABS tablet Take 1 tablet by mouth daily.   Yes [provider]  Omega-3 Fatty Acids (FISH OIL) 1200 MG CAPS Take 1,200 mg by mouth every morning.   Yes [provider]  pantoprazole (PROTONIX) 40 MG tablet Take 1 tablet (40 mg total) by mouth daily. 30 minutes before meal 01/12/21  Yes Fayrene Helper, MD  polyethylene glycol (MIRALAX) 17 g packet Take 17 g by mouth at bedtime.  Patient taking differently: Take 17 g by mouth daily. IN THE MORNING 05/05/20  Yes Noralyn Pick, NP  pravastatin (PRAVACHOL) 80 MG tablet Take 1 tablet (80 mg total) by mouth in the morning. 10/23/20  Yes Meng, Isaac Laud, PA  triamterene-hydrochlorothiazide (MAXZIDE) 75-50 MG tablet Take 0.5 tablets by mouth daily. 09/28/20  Yes Hilty, Nadean Corwin, MD         Current Outpatient Medications  Medication Sig Dispense Refill   Accu-Chek FastClix Lancets MISC 1 each by Other route 2 (two) times daily. 102 each 2   ALPHAGAN P 0.1 % SOLN Place 1 drop into both eyes 2 (two) times daily.     amLODipine (NORVASC) 10 MG tablet TAKE 1 TABLET(10 MG) BY MOUTH EVERY MORNING 90 tablet 3   Ascorbic Acid (VITAMIN C) 1000 MG tablet Take 1,000 mg by mouth 4 (four) times a week. AT NIGHT     aspirin EC 81 MG tablet Take 81 mg by mouth every other day. In the morning. Swallow whole.     benazepril (LOTENSIN) 40 MG tablet TAKE 1 TABLET(40 MG) BY MOUTH DAILY 90 tablet 1   bisacodyl (DULCOLAX) 5 MG EC tablet Take 5 mg by mouth daily as needed for moderate constipation.     Blood Glucose Monitoring Suppl (ACCU-CHEK GUIDE) w/Device KIT 1 each by Does not apply route 4 (four) times daily. 1 kit 0   cholecalciferol (VITAMIN D3) 25 MCG (1000 UT) tablet Take 1,000 Units by mouth in the morning.     clotrimazole-betamethasone (LOTRISONE) cream Apply twice daily for 1 week, to affected area , then as  needed (Patient taking differently: Apply 1 application topically 2 (two) times daily as needed (itching/skin irritation).) 45 g 0   ezetimibe (ZETIA) 10 MG tablet Take 1 tablet (10 mg total) by mouth daily. 90 tablet 3   glipiZIDE (GLUCOTROL XL) 5 MG 24 hr tablet Take 1 tablet (5 mg total) by mouth daily with breakfast. 90 tablet 1   glucose blood (ACCU-CHEK GUIDE) test strip USE TO CHECK BLOOD SUGAR TWICE DAILY 150 strip 1   insulin isophane & regular human KwikPen (HUMULIN 70/30 MIX) (70-30) 100 UNIT/ML KwikPen Inject 60 units with breakfast and 40 units with supper only if glucose is above 90 60 mL 3   Insulin Pen Needle (B-D ULTRAFINE III Brown PEN) 31G X 8 MM MISC USE AS DIRECTED TWICE DAILY WITH INSULIN PENS 100 each 5   latanoprost (XALATAN) 0.005 % ophthalmic solution Place 1 drop into both eyes at bedtime.      linaclotide (LINZESS) 290 MCG CAPS capsule Take 1 capsule (290 mcg total) by mouth daily before breakfast. 30 capsule 1   metFORMIN (GLUCOPHAGE) 500 MG tablet TAKE 1 TABLET BY MOUTH EVERY DAY WITH BREAKFAST 60 tablet 2   Multiple Vitamin (MULTIVITAMIN WITH MINERALS) TABS tablet Take 1 tablet by mouth daily.     Omega-3 Fatty Acids (FISH OIL) 1200 MG  CAPS Take 1,200 mg by mouth every morning.     pantoprazole (PROTONIX) 40 MG tablet Take 1 tablet (40 mg total) by mouth daily. 30 minutes before meal 90 tablet 1   polyethylene glycol (MIRALAX) 17 g packet Take 17 g by mouth at bedtime. (Patient taking differently: Take 17 g by mouth daily. IN THE MORNING) 14 each 0   pravastatin (PRAVACHOL) 80 MG tablet Take 1 tablet (80 mg total) by mouth in the morning. 90 tablet 3   triamterene-hydrochlorothiazide (MAXZIDE) 75-50 MG tablet Take 0.5 tablets by mouth daily. 45 tablet 0   No current facility-administered medications for this visit.        Allergies  Allergen Reactions   Senokot Wheat Bran [Wheat Bran]     ABDOMINAL CRAMPS   Spironolactone     Stomach problems, vision changes     Review of Systems:  General: normal appetite, + decreased energy, + weight gain, no weight loss, no fever  Cardiac: + chest pain with exertion, no chest pain at rest, +SOB with moderate exertion, no resting SOB, no PND, + orthopnea, no palpitations, no arrhythmia, no atrial fibrillation, + LE edema, + dizzy spells, no syncope  Respiratory: + exertional shortness of breath, no home oxygen, no productive cough, + dry cough, no bronchitis, no wheezing, no hemoptysis, no asthma, no pain with inspiration or cough, no sleep apnea, no CPAP at night  GI: no difficulty swallowing, no reflux, + frequent heartburn, no hiatal hernia, no abdominal pain, + constipation, no diarrhea, no hematochezia, no hematemesis, no melena  GU: no dysuria, no frequency, no urinary tract infection, no hematuria, no enlarged prostate, no kidney stones, + kidney disease  Vascular: no pain suggestive of claudication, no pain in feet, + leg cramps, no varicose veins, no DVT, no non-healing foot ulcer  Neuro: no stroke, no TIA's, no seizures, + headaches, no temporary blindness one eye, no slurred speech, + peripheral neuropathy, no chronic pain, no instability of gait, no memory/cognitive dysfunction  Musculoskeletal: + arthritis, no joint swelling, no myalgias, no difficulty walking, normal mobility  Skin: no rash, + itching, no skin infections, no pressure sores or ulcerations  Psych: no anxiety, no depression, + nervousness, no unusual recent stress  Eyes: + blurry vision, + floaters, no recent vision changes, + wears glasses ENT: no hearing loss, no loose or painful teeth, + dentures, last saw dentist 3 months ago  Hematologic: no easy bruising, no abnormal bleeding, no clotting disorder, no frequent epistaxis  Endocrine: + diabetes, does check CBG's at home  Physical Exam:  BP (!) 148/73  Pulse 80  Resp 20  Ht _0  (1.651 m)  Wt 188 lb (85.3 kg)  SpO2 97% Comment: RA  BMI 31.28 kg/m  General: well-appearing  HEENT:  Unremarkable, NCAT, PERLA, EOMI  Neck: no JVD, + bruits or transmitted murmur to neck, no adenopathy  Chest: clear to auscultation, symmetrical breath sounds, no wheezes, no rhonchi  CV: RRR, 3/6 systolic murmur RSB, no diastolic murmur  Abdomen: soft, non-tender, no masses  Extremities: warm, well-perfused, pulses palpable at ankle, no lower extremity edema  Rectal/GU Deferred  Neuro: Grossly non-focal and symmetrical throughout  Skin: Clean and dry, no rashes, no breakdown  Diagnostic Tests:  ECHOCARDIOGRAM REPORT     Patient Name: Joanna Brown Punch Date of Exam: 01/20/2021  Medical Rec #: [id number] Height: 65.0 in  Accession #: [id number] Weight: 188.0 lb  Date of Birth: 07/15/1940 BSA: 1.927 m  Patient Age: 64 years BP:  146/81 mmHg  Patient Gender: F HR: 71 bpm.  Exam Location: [address]   Procedure: 2D Echo, Cardiac Doppler, Color Doppler and Strain Analysis   Indications: I35.0 Aortic Stenosis   History: Patient has prior history of Echocardiogram examinations,  most  recent 06/04/2020. Risk Factors:Hypertension and HLD.   Sonographer: Marygrace Drought RCS  Referring Phys: [place]    1. Left ventricular ejection fraction, by estimation, is 55 to 60%. The  left ventricle has normal function. The left ventricle has no regional  wall motion abnormalities. Left ventricular diastolic parameters are  consistent with Grade I diastolic  dysfunction (impaired relaxation). The average left ventricular global  longitudinal strain is -16.1 %. The global longitudinal strain is  borderline reduced.  2. Right ventricular systolic function is normal. The right ventricular  size is normal. The estimated right ventricular systolic pressure is 47.4  mmHg.  3. Left atrial size was mildly dilated.  4. The mitral valve is degenerative. Mild mitral valve regurgitation.  Moderate to severe mitral annular calcification.  5. The aortic valve is tricuspid. There is  moderate calcification of the  aortic valve. There is severe thickening of the aortic valve. Aortic valve  regurgitation is not visualized. Moderate to severe aortic valve stenosis.   FINDINGS  Left Ventricle: Left ventricular ejection fraction, by estimation, is 55  to 60%. The left ventricle has normal function. The left ventricle has no  regional wall motion abnormalities. The average left ventricular global  longitudinal strain is -16.1 %.  The global longitudinal strain is abnormal. The left ventricular internal  cavity size was normal in size. There is borderline concentric left  ventricular hypertrophy. Left ventricular diastolic parameters are  consistent with Grade I diastolic dysfunction  (impaired relaxation). Indeterminate filling pressures.   Right Ventricle: The right ventricular size is normal. No increase in  right ventricular wall thickness. Right ventricular systolic function is  normal. The tricuspid regurgitant velocity is 1.52 m/s, and with an  assumed right atrial pressure of 3 mmHg,  the estimated right ventricular systolic pressure is 25.9 mmHg.   Left Atrium: Left atrial size was mildly dilated.   Right Atrium: Right atrial size was normal in size.   Pericardium: There is no evidence of pericardial effusion.   Mitral Valve: The mitral valve is degenerative in appearance. Moderate to  severe mitral annular calcification. Mild mitral valve regurgitation, with  centrally-directed jet.   Tricuspid Valve: The tricuspid valve is normal in structure. Tricuspid  valve regurgitation is trivial.   Aortic Valve: The aortic valve is tricuspid. There is moderate  calcification of the aortic valve. There is severe thickening of the  aortic valve. Aortic valve regurgitation is not visualized. Moderate to  severe aortic stenosis is present. Aortic valve  mean gradient measures 25.0 mmHg. Aortic valve peak gradient measures 43.6  mmHg. Aortic valve area, by VTI measures  0.83 cm.   Pulmonic Valve: The pulmonic valve was grossly normal. Pulmonic valve  regurgitation is not visualized.   Aorta: The aortic root is normal in size and structure.   IAS/Shunts: No atrial level shunt detected by color flow Doppler.    LEFT VENTRICLE  PLAX 2D  LVIDd: 4.10 cm Diastology  LVIDs: 3.00 cm LV e' medial: 5.44 cm/s  LV PW: 1.10 cm LV E/e' medial: 15.7  LV IVS: 1.20 cm LV e' lateral: 8.49 cm/s  LVOT diam: 2.00 cm LV E/e' lateral: 10.1  LV SV: 57  LV SV  Index: 30 2D Longitudinal Strain  LVOT Area: 3.13 cm 2D Strain GLS Avg: -16.1 %    RIGHT VENTRICLE  RV Basal diam: 3.00 cm  RV S prime: 11.60 cm/s  TAPSE (M-mode): 1.9 cm   LEFT ATRIUM Index RIGHT ATRIUM Index  LA diam: 3.70 cm 1.92 cm/m RA Area: 11.00 cm  LA Vol (A2C): 58.6 ml 30.41 ml/m RA Volume: 24.10 ml 12.51 ml/m  LA Vol (A4C): 49.0 ml 25.43 ml/m  LA Biplane Vol: 54.4 ml 28.23 ml/m  AORTIC VALVE  AV Area (Vmax): 0.80 cm  AV Area (Vmean): 0.79 cm  AV Area (VTI): 0.83 cm  AV Vmax: 330.00 cm/s  AV Vmean: 229.000 cm/s  AV VTI: 0.693 m  AV Peak Grad: 43.6 mmHg  AV Mean Grad: 25.0 mmHg  LVOT Vmax: 84.90 cm/s  LVOT Vmean: 58.200 cm/s  LVOT VTI: 0.183 m  LVOT/AV VTI ratio: 0.26   AORTA  Ao Root diam: 3.00 cm   MITRAL VALVE TRICUSPID VALVE  MV Area (PHT): TR Peak grad: 9.2 mmHg  MV Decel Time: TR Vmax: 152.00 cm/s  MV E velocity: 85.40 cm/s  MV A velocity: 148.00 cm/s SHUNTS  MV E/A ratio: 0.58 Systemic VTI: 0.18 m  Systemic Diam: 2.00 cm   Dani Gobble Croitoru MD  Electronically signed by Sanda Klein MD  Signature Date/Time: 01/20/2021/1:20:58 PM     Final  Physicians  Panel Physicians Referring Physician Case Authorizing Physician  Martinique, Peter M, MD (Primary)    Procedures  RIGHT/LEFT HEART CATH AND CORONARY ANGIOGRAPHY  Conclusion  Prox LAD to Mid LAD lesion is 30% stenosed.  Ost Cx to Mid Cx lesion is 20% stenosed.  Prox RCA lesion is 25% stenosed.  LV end diastolic  pressure is normal.  There is moderate aortic valve stenosis. 1. Mild nonobstructive CAD  2. Moderate to severe aortic stenosis AV mean gradient 27 mm Hg, peak gradient 30 mm Hg  3. Severe mitral annular calcification  4. Normal right heart and LV filling pressures.  5. Normal cardiac output.  Plan: patient should be considered for TAVR. As valve is not critical this could await resolution of our contrast shortage.  Recommendations  Antiplatelet/Anticoag Recommend Aspirin 72m daily for moderate CAD.  Indications  Nonrheumatic aortic valve stenosis [I35.0 (ICD-10-CM)]  Procedural Details  Technical Details Indication: 80yo BF with moderate to severe AS and chest pain  Procedural Details: The right groin was prepped, draped, and anesthetized with 1% lidocaine. Ultrasound was used to guide access. Image was obtained and stored in the record. Using the modified Seldinger technique a 5 Fr sheath was placed in the right femoral artery and a 7 French sheath was placed in the right femoral vein. A Swan-Ganz catheter was used for the right heart catheterization. Standard protocol was followed for recording of right heart pressures and sampling of oxygen saturations. Fick cardiac output was calculated. Standard Judkins catheters were used for selective coronary angiography and left ventricular pressures. A LA catheter and straight wire were used to cross the AV. There were no immediate procedural complications. Femoral hemostasis was obtained with a Mynx closure device. The patient was transferred to the post catheterization recovery area for further monitoring.  Contrast: 45 cc Estimated blood loss <50 mL.   During this procedure medications were administered to achieve and maintain moderate conscious sedation while the patient's heart rate, blood pressure, and oxygen saturation were continuously monitored and I was present face-to-face 100% of this time.  Medications  (Filter: Administrations  occurring  from 0945 to 1054 on 10/02/20)  important Continuous medications are totaled by the amount administered until 10/02/20 1054.  Heparin (Porcine) in NaCl 1000-0.9 UT/500ML-% SOLN (mL)  Total volume: 1,000 mL  Date/Time Rate/Dose/Volume Action   10/02/20 1008 500 mL Given   1009 500 mL Given   midazolam (VERSED) injection (mg)  Total dose: 2 mg  Date/Time Rate/Dose/Volume Action   10/02/20 1009 2 mg Given   fentaNYL (SUBLIMAZE) injection (mcg)  Total dose: 25 mcg  Date/Time Rate/Dose/Volume Action   10/02/20 1010 25 mcg Given   lidocaine (PF) (XYLOCAINE) 1 % injection (mL)  Total volume: 20 mL  Date/Time Rate/Dose/Volume Action   10/02/20 1018 20 mL Given   iohexol (OMNIPAQUE) 350 MG/ML injection (mL)  Total volume: 45 mL  Date/Time Rate/Dose/Volume Action   10/02/20 1048 45 mL Given   Sedation Time  Sedation Time Physician-1: 31 minutes 42 seconds  Contrast  Medication Name Total Dose  iohexol (OMNIPAQUE) 350 MG/ML injection 45 mL  Radiation/Fluoro  Fluoro time: 6.1 (min)  DAP: 13614 (mGycm2)  Cumulative Air Kerma: 323 (mGy)  Complications     Complications documented before study signed (10/02/2020 55:73 AM)    No complications were associated with this study.   Documented by Martinique, Peter M, MD - 10/02/2020 10:55 AM   Coronary Findings  Diagnostic  Dominance: Right  Left Main  Vessel was injected. Vessel is normal in caliber. Vessel is angiographically normal.  Left Anterior Descending  Prox LAD to Mid LAD lesion is 30% stenosed. The lesion is moderately calcified.  Ramus Intermedius  Vessel was injected. Vessel is normal in caliber. Vessel is angiographically normal.  Left Circumflex  Ost Cx to Mid Cx lesion is 20% stenosed.  Right Coronary Artery  Prox RCA lesion is 25% stenosed. The lesion is moderately calcified.  Intervention  No interventions have been documented.  Left Heart  Left Ventricle LV end diastolic pressure is normal.  Mitral Valve The  annulus is calcified.  Aortic Valve There is moderate aortic valve stenosis. The aortic valve is calcified. There is abnormal aortic valve motion.  Coronary Diagrams  Diagnostic  Dominance: Right  Intervention  Implants     Vascular Products   Closure Mynx Control 41f-- [id number] Implanted   Inventory item: [facility]URE [facility] Model/Cat number: [id number] Manufacturer: CEugeneLot number: [id number] Device identifier: [id number] identifier type: GS1  GUDID Information   Request status Successful    Brand name: MYNX CONTROL Version/Model: [id number] Company name: AEast Arcadiasafety info as of 10/02/20: MR Safe  Contains dry or latex rubber: No    GMDN P.T. name: Wound hydrogel dressing, non-antimicrobial    As of 10/02/2020    Status: Implanted      Syngo Images  Show images for CARDIAC CATHETERIZATION  Images on Long Term Storage  Show images for Mangels, Keyon H Link to Procedure Log    Procedure Log  Hemo Data  Flowsheet Row Most Recent Value  Fick Cardiac Output 13.09 L/min  Fick Cardiac Output Index 6.84 (L/min)/BSA  Aortic Mean Gradient 26.65 mmHg  Aortic Peak Gradient 30 mmHg  Aortic Valve Area 3.14  Aortic Value Area Index 1.64 cm2/BSA  RA A Wave 2 mmHg  RA V Wave 1 mmHg  RA Mean 0 mmHg  RV Systolic Pressure 22 mmHg  RV Diastolic Pressure -3 mmHg  RV EDP 1 mmHg  PA Systolic Pressure 23 mmHg  PA Diastolic  Pressure 2 mmHg  PA Mean 10 mmHg  PW A Wave 4 mmHg  PW V Wave 5 mmHg  PW Mean 3 mmHg  AO Systolic Pressure 740 mmHg  AO Diastolic Pressure 61 mmHg  AO Mean 87 mmHg  LV Systolic Pressure 814 mmHg  LV Diastolic Pressure -6 mmHg  LV EDP 11 mmHg  AOp Systolic Pressure 481 mmHg  AOp Diastolic Pressure 58 mmHg  AOp Mean Pressure 86 mmHg  LVp Systolic Pressure 856 mmHg  LVp Diastolic Pressure 0 mmHg  LVp EDP Pressure 10 mmHg  QP/QS 1  TPVR Index 1.46 HRUI  TSVR Index 12.72 HRUI  TPVR/TSVR Ratio 0.11   ADDENDUM REPORT:  01/31/2021 13:45  CLINICAL DATA: Pre-op transcatheter aortic valve replacement (TAVR)  EXAM:  Cardiac TAVR CT  TECHNIQUE:  The patient was scanned on a Siemens Force 314 slice scanner. A 120  kV retrospective scan was triggered in the descending thoracic aorta  at 111 HU's. Gantry rotation speed was 270 msecs and collimation was  .9 mm. The 3D data set was reconstructed in 5% intervals of the R-R  cycle. Systolic and diastolic phases were analyzed on a dedicated  work station using MPR, MIP and VRT modes. The patient received 60m  OMNIPAQUE IOHEXOL 350 MG/ML SOLN of contrast.  FINDINGS:  Aortic Valve: Tricuspid aortic valve. Severely reduced cusp  separation. Moderately thickened, severely calcified aortic valve  cusps.  AV calcium score: 1144  Virtual Basal Annulus Measurements:  Maximum/Minimum Diameter: 26 x 21.1 mm  Perimeter: 72.4 mm  Area: 400 mm2  No significant LVOT calcifications.  Based on these measurements, the annulus would be suitable for a 23  mm Sapien 3 valve.  Sinus of Valsalva Measurements:  Non-coronary: 29 mm  Right - coronary: 28 mm  Left - coronary: 30 mm  Sinus of Valsalva Height:  Left: 19.7 mm  Right: 17.1 mm  Aorta: Mild atherosclerosis and calcifications, conventional 3  vessel branch pattern of aortic arch.  Sinotubular Junction: 28 mm  Ascending Thoracic Aorta: 33 mm  Aortic Arch: 29 mm  Descending Thoracic Aorta: 26 mm  Coronary Artery Height above Annulus:  Left Main: 13.4 mm  Right Coronary: 12.3 mm  Coronary Arteries: Normal coronary origin. Right dominance. The  study was performed without use of NTG and insufficient for plaque  evaluation. 3 vessel coronary artery calcifications.  Optimum Fluoroscopic Angle for Delivery: LAO 4 CAU 4  Severe mitral annular calcification.  No LA appendage thrombus.  IMPRESSION:  1. Tricuspid aortic valve. Severely reduced cusp separation.  Moderately thickened, severely calcified aortic valve cusps.   2. AV calcium score: 1144  3. Annulus area: 400 mm2, no significant LVOT calcifications. The  annulus would be suitable for a 23 mm Sapien 3 valve.  4. Sufficient coronary artery heights from annulus for deployment.  5. Optimum Fluoroscopic Angle for Delivery: LAO 4 CAU 4  6. Severe mitral annular calcification.  Electronically Signed  By: GCherlynn KaiserM.D.  On: 01/31/2021 13:45      Narrative & Impression  CLINICAL DATA: 80year old female with moderate to severe aortic  stenosis. Pre-TAVR intervention planning. History of colon cancer.  EXAM:  CT ANGIOGRAPHY CHEST, ABDOMEN AND PELVIS  TECHNIQUE:  Multidetector CT imaging through the chest, abdomen and pelvis was  performed using the standard protocol during bolus administration of  intravenous contrast. Multiplanar reconstructed images and MIPs were  obtained and reviewed to evaluate the vascular anatomy.  CONTRAST: 916mOMNIPAQUE IOHEXOL 350 MG/ML SOLN  COMPARISON:  03/23/2020 CT chest, abdomen and pelvis.  FINDINGS:  CTA CHEST FINDINGS  Cardiovascular: Mild cardiomegaly. No significant pericardial  effusion/thickening. Diffuse thickening and coarse calcification of  the aortic valve. Three-vessel coronary atherosclerosis.  Atherosclerotic nonaneurysmal thoracic aorta. Top-normal caliber  main pulmonary artery (3.2 cm diameter). No central pulmonary  emboli.  Mediastinum/Nodes: Left hemithyroidectomy. Exophytic heterogeneous  density 3.9 cm anterior right thyroid isthmus nodule. Unremarkable  esophagus. No pathologically enlarged axillary, mediastinal or hilar  lymph nodes.  Lungs/Pleura: No pneumothorax. No pleural effusion. No acute  consolidative airspace disease or lung masses. Three scattered tiny  solid pulmonary nodules in the lungs bilaterally, largest 3 mm in  the anterior left lower lobe (series 9/image 76), all stable since  03/23/2020 and presumably benign. No new significant pulmonary  nodules.   Musculoskeletal: No aggressive appearing focal osseous lesions.  Moderate thoracic spondylosis.  CTA ABDOMEN AND PELVIS FINDINGS  Hepatobiliary: Normal liver with no liver mass. Cholecystectomy. No  biliary ductal dilatation.  Pancreas: Normal, with no mass or duct dilation.  Spleen: Normal size. No mass.  Adrenals/Urinary Tract: Normal adrenals. No hydronephrosis.  Subcentimeter hypodense scattered bilateral renal cortical lesions,  too small to characterize, requiring no follow-up. No additional  contour deforming renal lesions. Normal bladder.  Stomach/Bowel: Normal non-distended stomach. Normal caliber small  bowel with no small bowel wall thickening. Normal appendix. No large  bowel wall thickening, significant diverticulosis or significant  pericolonic fat stranding.  Vascular/Lymphatic: Atherosclerotic nonaneurysmal abdominal aorta.  No pathologically enlarged lymph nodes in the abdomen or pelvis.  Reproductive: Status post hysterectomy, with no abnormal findings at  the vaginal cuff. No adnexal mass.  Other: No pneumoperitoneum, ascites or focal fluid collection. Tiny  4 mm solid peritoneal nodule anterior to the right lower lobe  (series 8/image 145), stable and presumably benign.  Musculoskeletal: No aggressive appearing focal osseous lesions. Mild  lumbar spondylosis.  VASCULAR MEASUREMENTS PERTINENT TO TAVR:  AORTA:  Minimal Aortic Diameter-15.9 x 11.9 mm  Severity of Aortic Calcification-moderate  RIGHT PELVIS:  Right Common Iliac Artery -  Minimal Diameter-10.1 x 7.6 mm  Tortuosity-mild-to-moderate  Calcification-moderate  Right External Iliac Artery -  Minimal Diameter-8.3 x 8.1 mm  Tortuosity-mild-to-moderate  Calcification-mild  Right Common Femoral Artery -  Minimal Diameter-8.2 x 7.2 mm  Tortuosity-mild  Calcification-moderate  LEFT PELVIS:  Left Common Iliac Artery -  Minimal Diameter-9.0 x 8.9 mm  Tortuosity-mild  Calcification-severe  Left  External Iliac Artery -  Minimal Diameter-8.5 x 8.4 mm  Tortuosity-mild  Calcification-mild  Left Common Femoral Artery -  Minimal Diameter-8.1 x 7.6 mm  Tortuosity-mild  Calcification-moderate  Review of the MIP images confirms the above findings.  IMPRESSION:  1. Vascular findings and measurements pertinent to potential TAVR  procedure, as detailed.  2. Diffusely thickened and calcified aortic valve, compatible with  the reported history of severe aortic stenosis.  3. Mild cardiomegaly.  4. Exophytic heterogeneous 3.9 cm anterior right thyroid isthmus  nodule. This has been evaluated on previous imaging. (ref: J Am Coll  Radiol. 2015 Feb;12(2): 143-50).  5. No findings suspicious for metastatic disease. Tiny bilateral  pulmonary nodules and right upper quadrant perihepatic peritoneal  nodule are all stable and presumably benign.  6. Aortic Atherosclerosis (ICD10-I70.0).  Electronically Signed  By: Ilona Sorrel M.D.  On: 01/28/2021 13:44     STS Risk Score:   Risk of Mortality:  2.049%  Renal Failure:  1.975%  Permanent Stroke:  1.607%  Prolonged Ventilation:  6.038%  DSW Infection:  0.106%  Reoperation:  2.643%  Morbidity or Mortality:  10.109%  Brown Length of Stay:  35.149%  Long Length of Stay:  4.766%   Impression:   This 80 year old woman has stage D, severe, symptomatic, low-flow/low gradient aortic stenosis with New York Heart Association class II symptoms of exertional fatigue and shortness of breath consistent with chronic diastolic congestive heart failure. She has also had exertional dizziness and chest tightness. I have personally reviewed her 2D echocardiogram, cardiac catheterization, and CTA studies. Echocardiogram shows a trileaflet aortic valve with moderate calcification and thickening of the leaflets with restricted mobility. The mean gradient was measured at 25 mmHg with a valve area of 0.3 cm. Left ventricular systolic function was normal. Her  prior echocardiogram in January 2022 measured a mean aortic valve gradient of 32.5 mmHg with a valve area of 0.77 cm. Cardiac catheterization shows mild nonobstructive coronary disease. The mean gradient was measured at 27 mmHg with a peak gradient of 30 mmHg. I agree that aortic valve replacement is indicated in this patient for relief of her symptoms and to prevent progressive left ventricular deterioration. Her gated cardiac CTA shows anatomy suitable for TAVR using a 23 mm SAPIEN 3 valve. The aortic valve appears trileaflet with severely reduced cusp separation with moderately thickened and severely calcified leaflets. There is severe mitral annular calcification. Given her age I think that transcatheter aortic valve replacement would be the best treatment for her. Her abdominal and pelvic CTA shows adequate pelvic vascular anatomy to allow transfemoral insertion.  The patient and her husband were counseled at length regarding treatment alternatives for management of severe symptomatic aortic stenosis. The risks and benefits of surgical intervention has been discussed in detail. Long-term prognosis with medical therapy was discussed. Alternative approaches such as conventional surgical aortic valve replacement, transcatheter aortic valve replacement, and palliative medical therapy were compared and contrasted at length. This discussion was placed in the context of the patient's own specific clinical presentation and past medical history. All of their questions have been addressed.  Following the decision to proceed with transcatheter aortic valve replacement, a discussion was held regarding what types of management strategies would be attempted intraoperatively in the event of life-threatening complications, including whether or not the patient would be considered a candidate for the use of cardiopulmonary bypass and/or conversion to open sternotomy for attempted surgical intervention. She is in overall good  condition for her age and I think she would be a candidate for emergent sternotomy to manage any intraoperative complications. The patient is aware of the fact that transient use of cardiopulmonary bypass may be necessary. The patient has been advised of a variety of complications that might develop including but not limited to risks of death, stroke, paravalvular leak, aortic dissection or other major vascular complications, aortic annulus rupture, device embolization, cardiac rupture or perforation, mitral regurgitation, acute myocardial infarction, arrhythmia, heart block or bradycardia requiring permanent pacemaker placement, congestive heart failure, respiratory failure, renal failure, pneumonia, infection, other late complications related to structural valve deterioration or migration, or other complications that might ultimately cause a temporary or permanent loss of functional independence or other long term morbidity. The patient provides full informed consent for the procedure as described and all questions were answered.   Plan:   Transfemoral transcatheter aortic valve replacement using a SAPIEN 3 valve.  Gaye Pollack, MD

## 2021-03-22 NOTE — Progress Notes (Signed)
PCP - Dr. Tula Nakayama Cardiologist - Dr. Lyman Bishop  PPM/ICD - denies   Chest x-ray - 03/22/21 EKG - 03/22/21 Stress Test - 09/03/08 ECHO - 01/20/21 Cardiac Cath - 10/02/20  Sleep Study - denies  DM- Type 2 Fasting Blood Sugar - 70-130 Checks Blood Sugar twice a day  Blood Thinner Instructions: n/a Aspirin Instructions: pt instructed to stop taking ASA on 03/18/21, but says that she forgot and accidentally took one this morning.  ERAS Protcol - no, NPO   COVID TEST- 03/22/21 at PAT   Anesthesia review: yes, cardiac hx  Patient denies shortness of breath, fever, cough and chest pain at PAT appointment   All instructions explained to the patient, with a verbal understanding of the material. Patient agrees to go over the instructions while at home for a better understanding. Patient also instructed to wear a mask in public after being tested for COVID-19. The opportunity to ask questions was provided.

## 2021-03-22 NOTE — Therapy (Signed)
[facility] [place], [place], [address] Phone: [phone]   Fax:  [phone]  Physical Therapy Evaluation  Patient Details  Name: GRACELYN COVENTRY MRN: [id number] Date of Birth: 03-29-1941 Referring Provider (PT): Fayrene Helper, MD   Encounter Date: 03/22/2021   PT End of Session - 03/22/21 1300     Visit Number 1    Authorization Type Medicare    PT Start Time 1215    PT Stop Time 1245    PT Time Calculation (min) 30 min    Activity Tolerance Patient tolerated treatment well    Behavior During Therapy [facility] for tasks assessed/performed             Past Medical History:  Diagnosis Date   Arthritis    OA   Blood transfusion    1980   Diabetes mellitus    Dysrhythmia    Female bladder prolapse    GERD 11/18/2008   Qualifier: Diagnosis of  By: Craige Cotta     GERD (gastroesophageal reflux disease)    Glaucoma    Heart disease    Heart murmur    pt says she thinks Dr. [name] told her this   Hyperlipidemia    Hypertension    echo and stress 4/10 reports on chart, EKG ` LOV 9/12 on chart   Mild aortic stenosis    Thyroid disease     Past Surgical History:  Procedure Laterality Date   ABDOMINAL HYSTERECTOMY     ANTERIOR AND POSTERIOR REPAIR  04/26/2011   Procedure: ANTERIOR (CYSTOCELE) AND POSTERIOR REPAIR (RECTOCELE);  Surgeon: Reece Packer, MD;  Location: [facility];  Service: Urology;  Laterality: N/A;   BIOPSY  05/25/2020   Procedure: BIOPSY;  Surgeon: Doran Stabler, MD;  Location: [facility];  Service: Gastroenterology;;   CATARACT EXTRACTION Bilateral    with IOL   CHOLECYSTECTOMY  2009   COLONOSCOPY N/A 12/02/2013   three colon polyps removed, small internal hemorrhoids. Hyperplastic polyps   COLONOSCOPY N/A 11/20/2019   pancolonic diverticulosis, two 10-11 mm polyps in ascending colon, one 5 mm polyp in cecum, ascending colon with superficially invasive adenocarcinoma  arising in background of sessile serrated polyps with low and high grade cytologic dysplasia.   COLONOSCOPY     COLONOSCOPY W/ POLYPECTOMY     COLONOSCOPY WITH PROPOFOL N/A 05/25/2020   diverticulosis in right colon, redundant colon. Caution warranted on future colonoscopy in light of age, cardiac condition, challenging anatomy.    DILATION AND CURETTAGE OF UTERUS     pt states this was in the 1970's   ESOPHAGOGASTRODUODENOSCOPY (EGD) WITH PROPOFOL N/A 05/25/2020   Grade 1 esophageal varices, single mucosal nodule in stomach s/p biopsy. (hyperplastic)>    LEFT HEART CATH  09/10/2008   normal coronary arteries, normal LV systolic function, EF 28% (Dr. [name])   [place] Right 1997   under arm   NM MYOCAR PERF WALL MOTION  2010   dipyridamole - mild-mod in intenstiy perfusion defect in mid anterior, mid anteroseptal wall, EF 70%   OVARY SURGERY     bilateral tumors removed   POLYPECTOMY  11/20/2019   Procedure: POLYPECTOMY;  Surgeon: Daneil Dolin, MD;  Location: [facility];  Service: Endoscopy;;  hot and cold snare cecal polyp, and asending polyps x 2   RIGHT/LEFT HEART CATH AND CORONARY ANGIOGRAPHY N/A 10/02/2020   Procedure: RIGHT/LEFT HEART CATH AND CORONARY ANGIOGRAPHY;  Surgeon: Martinique, Peter M, MD;  Location: [facility];  Service: Cardiovascular;  Laterality: N/A;   THYROIDECTOMY     THYROIDECTOMY  02/2008   TRANSTHORACIC ECHOCARDIOGRAM  08/2011   EF=>55%, mild conc LVH; trace MR; mild TR; mild-mod AV calcification with mild valvular AV stenosis   VAGINAL PROLAPSE REPAIR  04/26/2011   Procedure: VAGINAL VAULT SUSPENSION;  Surgeon: Reece Packer, MD;  Location: [facility];  Service: Urology;  Laterality: N/A;  with Graft  10x6    There were no vitals filed for this visit.    Subjective Assessment - 03/22/21 1214     Subjective Pt presents to PT with reports of increased fatigue and decreased energy levels over the last few months. She denies any  musculoskeletal pain, with main compliant being decreased enegry and dyspnea. This has limited her ability to perform home ADLs and community activities. She is frustrated with her current functional level, as she was very active prior to the last few months. Would like to improve endurance and functional ability.    Pertinent History few month hx of increasing fatigue and ShoB secondary to severe aortic stenosis    Limitations Walking;Other (comment)   home ADLs and community activity   How long can you sit comfortably? indefinite    How long can you stand comfortably? 15-20 min    How long can you walk comfortably? 15-20 min    Patient Stated Goals to decrease fatigue and improve endurance with home ADLs and community related activities    Currently in Pain? No/denies    Pain Score 0-No pain                [facility] PT Assessment - 03/22/21 0001       Assessment   Medical Diagnosis I35.0 (ICD-10-CM) - Severe aortic stenosis    Referring Provider (PT) Fayrene Helper, MD      Strength   Overall Strength Comments UE/LE MMT grossly 4+/5 bilaterally    Right Hand Grip (lbs) 60    Left Hand Grip (lbs) 45              [facility] Pre-Surgical Assessment - 03/22/21 0001     5 Meter Walk Test- trial 1 7 sec    5 Meter Walk Test- trial 2 7 sec.     5 Meter Walk Test- trial 3 6 sec.    5 meter walk test average 6.67 sec    4 Stage Balance Test tolerated for:  8 sec.    4 Stage Balance Test Position 4    Sit To Stand Test- trial 1 13 sec.    ADL/IADL Independent with: Bathing;Dressing;Meal prep;Finances    ADL/IADL Needs Assistance with: Valla Leaver work    ADL/IADL Fraility Index Midly frail    6 Minute Walk- Baseline yes    BP (mmHg) 125/69    HR (bpm) 78    02 Sat (%RA) 98 %    Modified Borg Scale for Dyspnea 1- Very mild shortness of breath    Perceived Rate of Exertion (Borg) 13- Somewhat hard    6 Minute Walk Post Test yes    BP (mmHg) 141/74    HR (bpm) 86    02 Sat (%RA) 96 %     Modified Borg Scale for Dyspnea 2- Mild shortness of breath    Perceived Rate of Exertion (Borg) 14-    Aerobic Endurance Distance Walked 1048    Endurance additional comments no rest break required  Objective measurements completed on examination: See above findings.                PT Education - 03/22/21 1257     Education Details pre-Tavr exam instructions    Person(s) Educated Patient    Methods Explanation;Demonstration    Comprehension Verbalized understanding;Returned demonstration                         Plan - 03/22/21 1248     Personal Factors and Comorbidities Comorbidity 2;Fitness    Comorbidities PMH: DM II, aortic stenosis    Examination-Activity Limitations Locomotion Level;Squat;Stairs;Transfers    Examination-Participation Restrictions Yard Work;Community Activity;Volunteer    Stability/Clinical Decision Making Stable/Uncomplicated    Clinical Decision Making Low    PT Frequency One time visit    PT Treatment/Interventions ADLs/Self Care Home Management;Electrical Stimulation    Consulted and Agree with Plan of Care Patient            Clinical Impression Statement: Pt is a 80 yo F presenting to OP PT for evaluation prior to possible TAVR surgery due to severe aortic stenosis. Pt reports onset of fatigue and shortness of breath with activity approximately 3 months ago. Symptoms are limiting her ability to perform home ADLs, yard work, and desired community activities. Pt presents with WFL ROM and strength, slightly impaired balance and is minimal risk for falls on 4 stage balance test, 6.27m/s walking speed and decreased aerobic endurance per 6 minute walk test. Pt ambulated a total of 1048 feet in 6 minute walk. Fatigue and dyspnea  increased significantly with 6 minute walk test. Based on the Short Physical Performance Battery, patient has a frailty rating of 9/12 with </= 5/12 considered frail.     Visit Diagnosis: Difficulty in walking, not elsewhere classified - Plan: PT plan of care cert/re-cert, CANCELED: PT plan of care cert/re-cert     Problem List Patient Active Problem List   Diagnosis Date Noted   Hepatic cirrhosis ([place]) 08/05/2020   History of colon cancer 03/04/2020   GERD (gastroesophageal reflux disease) 03/04/2020   Nodular goiter 12/10/2019   Low back pain with left-sided sciatica 11/06/2019   Positive colorectal cancer screening using Cologuard test 10/10/2019   Constipation 10/10/2019   Aortic stenosis 08/23/2013   Carotid bruit 06/11/2012   IRRITABLE BOWEL SYNDROME 08/24/2009   BILIARY DYSKINESIA 05/31/2009   Type 2 diabetes mellitus with stage 1 chronic kidney disease, with long-term current use of insulin ([place]) 08/06/2008   Mixed hyperlipidemia 08/06/2008   Overweight (BMI 25.0-29.9) 08/06/2008   Essential hypertension 08/06/2008    Ward Chatters, PT 03/22/2021, 1:04 PM  [facility] [address] [place], [place], [address] Phone: [phone]   Fax:  [phone]  Name: ANTASIA HAIDER MRN: [id number] Date of Birth: 22-Mar-1941

## 2021-03-23 ENCOUNTER — Inpatient Hospital Stay (HOSPITAL_COMMUNITY)
Admission: RE | Admit: 2021-03-23 | Discharge: 2021-03-23 | Disposition: A | Discharge status: Home / Self Care | Payer: Medicare Other | Source: Ambulatory Visit | Attending: Physician Assistant | Admitting: Physician Assistant

## 2021-03-23 ENCOUNTER — Inpatient Hospital Stay (HOSPITAL_COMMUNITY): Payer: Medicare Other | Admitting: Certified Registered"

## 2021-03-23 ENCOUNTER — Inpatient Hospital Stay (HOSPITAL_COMMUNITY): Payer: Medicare Other | Admitting: Physician Assistant

## 2021-03-23 ENCOUNTER — Encounter (HOSPITAL_COMMUNITY): Payer: Self-pay | Admitting: Cardiovascular Disease

## 2021-03-23 ENCOUNTER — Other Ambulatory Visit: Payer: Self-pay

## 2021-03-23 ENCOUNTER — Other Ambulatory Visit: Payer: Self-pay | Admitting: Cardiology

## 2021-03-23 ENCOUNTER — Inpatient Hospital Stay (HOSPITAL_COMMUNITY)
Admission: RE | Admit: 2021-03-23 | Discharge: 2021-03-24 | DRG: 267 | Disposition: A | Discharge status: Home / Self Care | Payer: Medicare Other | Attending: Cardiovascular Disease | Admitting: Cardiovascular Disease

## 2021-03-23 ENCOUNTER — Encounter (HOSPITAL_COMMUNITY)
Admission: RE | Disposition: A | Discharge status: Home / Self Care | Payer: Self-pay | Source: Home / Self Care | Attending: Cardiovascular Disease

## 2021-03-23 DIAGNOSIS — Z85038 Personal history of other malignant neoplasm of large intestine: Secondary | ICD-10-CM | POA: Diagnosis present

## 2021-03-23 DIAGNOSIS — Z90710 Acquired absence of both cervix and uterus: Secondary | ICD-10-CM

## 2021-03-23 DIAGNOSIS — I350 Nonrheumatic aortic (valve) stenosis: Secondary | ICD-10-CM

## 2021-03-23 DIAGNOSIS — I2510 Atherosclerotic heart disease of native coronary artery without angina pectoris: Secondary | ICD-10-CM | POA: Diagnosis present

## 2021-03-23 DIAGNOSIS — Z7982 Long term (current) use of aspirin: Secondary | ICD-10-CM | POA: Diagnosis not present

## 2021-03-23 DIAGNOSIS — E890 Postprocedural hypothyroidism: Secondary | ICD-10-CM | POA: Diagnosis present

## 2021-03-23 DIAGNOSIS — E1122 Type 2 diabetes mellitus with diabetic chronic kidney disease: Secondary | ICD-10-CM | POA: Diagnosis present

## 2021-03-23 DIAGNOSIS — Z006 Encounter for examination for normal comparison and control in clinical research program: Secondary | ICD-10-CM | POA: Diagnosis not present

## 2021-03-23 DIAGNOSIS — Z8249 Family history of ischemic heart disease and other diseases of the circulatory system: Secondary | ICD-10-CM | POA: Diagnosis not present

## 2021-03-23 DIAGNOSIS — M1990 Unspecified osteoarthritis, unspecified site: Secondary | ICD-10-CM | POA: Diagnosis present

## 2021-03-23 DIAGNOSIS — Z79899 Other long term (current) drug therapy: Secondary | ICD-10-CM | POA: Diagnosis not present

## 2021-03-23 DIAGNOSIS — Z20822 Contact with and (suspected) exposure to covid-19: Secondary | ICD-10-CM | POA: Diagnosis present

## 2021-03-23 DIAGNOSIS — I129 Hypertensive chronic kidney disease with stage 1 through stage 4 chronic kidney disease, or unspecified chronic kidney disease: Secondary | ICD-10-CM | POA: Diagnosis present

## 2021-03-23 DIAGNOSIS — E785 Hyperlipidemia, unspecified: Secondary | ICD-10-CM | POA: Diagnosis present

## 2021-03-23 DIAGNOSIS — Z952 Presence of prosthetic heart valve: Secondary | ICD-10-CM

## 2021-03-23 DIAGNOSIS — E1169 Type 2 diabetes mellitus with other specified complication: Secondary | ICD-10-CM

## 2021-03-23 DIAGNOSIS — K7460 Unspecified cirrhosis of liver: Secondary | ICD-10-CM | POA: Diagnosis not present

## 2021-03-23 DIAGNOSIS — Z794 Long term (current) use of insulin: Secondary | ICD-10-CM | POA: Diagnosis not present

## 2021-03-23 DIAGNOSIS — I10 Essential (primary) hypertension: Secondary | ICD-10-CM | POA: Diagnosis present

## 2021-03-23 DIAGNOSIS — Z6831 Body mass index (BMI) 31.0-31.9, adult: Secondary | ICD-10-CM | POA: Diagnosis not present

## 2021-03-23 DIAGNOSIS — R0602 Shortness of breath: Secondary | ICD-10-CM

## 2021-03-23 DIAGNOSIS — K589 Irritable bowel syndrome without diarrhea: Secondary | ICD-10-CM | POA: Diagnosis not present

## 2021-03-23 DIAGNOSIS — K219 Gastro-esophageal reflux disease without esophagitis: Secondary | ICD-10-CM | POA: Diagnosis present

## 2021-03-23 DIAGNOSIS — Z7984 Long term (current) use of oral hypoglycemic drugs: Secondary | ICD-10-CM | POA: Diagnosis not present

## 2021-03-23 DIAGNOSIS — H409 Unspecified glaucoma: Secondary | ICD-10-CM | POA: Diagnosis present

## 2021-03-23 DIAGNOSIS — N181 Chronic kidney disease, stage 1: Secondary | ICD-10-CM | POA: Diagnosis present

## 2021-03-23 DIAGNOSIS — E663 Overweight: Secondary | ICD-10-CM | POA: Diagnosis present

## 2021-03-23 HISTORY — PX: TRANSCATHETER AORTIC VALVE REPLACEMENT, TRANSFEMORAL: SHX6400

## 2021-03-23 HISTORY — DX: Presence of prosthetic heart valve: Z95.2

## 2021-03-23 HISTORY — DX: Angina pectoris, unspecified: I20.9

## 2021-03-23 HISTORY — DX: Nonrheumatic aortic (valve) stenosis: I35.0

## 2021-03-23 HISTORY — DX: Dyspnea, unspecified: R06.00

## 2021-03-23 LAB — GLUCOSE, CAPILLARY
Glucose-Capillary: 119 mg/dL — ABNORMAL HIGH (ref 70–99)
Glucose-Capillary: 87 mg/dL (ref 70–99)
Glucose-Capillary: 107 mg/dL — ABNORMAL HIGH (ref 70–99)
Glucose-Capillary: 179 mg/dL — ABNORMAL HIGH (ref 70–99)

## 2021-03-23 LAB — POCT I-STAT 7, (LYTES, BLD GAS, ICA,H+H)
Acid-Base Excess: 3 mmol/L — ABNORMAL HIGH (ref 0.0–2.0)
Bicarbonate: 28.4 mmol/L — ABNORMAL HIGH (ref 20.0–28.0)
Calcium, Ion: 1.31 mmol/L (ref 1.15–1.40)
HCT: 34 % — ABNORMAL LOW (ref 36.0–46.0)
Hemoglobin: 11.6 g/dL — ABNORMAL LOW (ref 12.0–15.0)
O2 Saturation: 98 %
Potassium: 3.2 mmol/L — ABNORMAL LOW (ref 3.5–5.1)
Sodium: 140 mmol/L (ref 135–145)
TCO2: 30 mmol/L (ref 22–32)
pCO2 arterial: 45.3 mmHg (ref 32.0–48.0)
pH, Arterial: 7.405 (ref 7.350–7.450)
pO2, Arterial: 104 mmHg (ref 83.0–108.0)

## 2021-03-23 LAB — ECHOCARDIOGRAM LIMITED
AR max vel: 1.42 cm2
AV Area VTI: 1.7 cm2
AV Area mean vel: 1.53 cm2
AV Mean grad: 6 mmHg
AV Peak grad: 10.4 mmHg
Ao pk vel: 1.61 m/s

## 2021-03-23 LAB — POCT I-STAT, CHEM 8
BUN: 17 mg/dL (ref 8–23)
Calcium, Ion: 1.26 mmol/L (ref 1.15–1.40)
Chloride: 101 mmol/L (ref 98–111)
Creatinine, Ser: 1 mg/dL (ref 0.44–1.00)
Glucose, Bld: 122 mg/dL — ABNORMAL HIGH (ref 70–99)
HCT: 32 % — ABNORMAL LOW (ref 36.0–46.0)
Hemoglobin: 10.9 g/dL — ABNORMAL LOW (ref 12.0–15.0)
Potassium: 3.2 mmol/L — ABNORMAL LOW (ref 3.5–5.1)
Sodium: 139 mmol/L (ref 135–145)
TCO2: 24 mmol/L (ref 22–32)

## 2021-03-23 LAB — POCT ACTIVATED CLOTTING TIME
Activated Clotting Time: 138 seconds
Activated Clotting Time: 289 seconds

## 2021-03-23 SURGERY — IMPLANTATION, AORTIC VALVE, TRANSCATHETER, FEMORAL APPROACH
Anesthesia: Monitor Anesthesia Care

## 2021-03-23 MED ORDER — OXYCODONE HCL 5 MG PO TABS: 5.0000 mg | ORAL_TABLET | ORAL | Status: DC | PRN

## 2021-03-23 MED ORDER — HEPARIN (PORCINE) IN NACL 1000-0.9 UT/500ML-% IV SOLN
INTRAVENOUS | Status: DC | PRN
  Administered 2021-03-23 (×3): 500 mL

## 2021-03-23 MED ORDER — ASPIRIN EC 81 MG PO TBEC: 81.0000 mg | DELAYED_RELEASE_TABLET | ORAL | Status: DC

## 2021-03-23 MED ORDER — TRAMADOL HCL 50 MG PO TABS: 50.0000 mg | ORAL_TABLET | ORAL | Status: DC | PRN

## 2021-03-23 MED ORDER — ASPIRIN EC 81 MG PO TBEC
81.0000 mg | DELAYED_RELEASE_TABLET | ORAL | Status: DC
  Administered 2021-03-23 – 2021-03-24 (×2): 81 mg via ORAL
  Filled 2021-03-23 (×3): qty 1

## 2021-03-23 MED ORDER — ACETAMINOPHEN 650 MG RE SUPP: 650.0000 mg | Freq: Four times a day (QID) | RECTAL | Status: DC | PRN

## 2021-03-23 MED ORDER — LIDOCAINE HCL 1 % IJ SOLN
INTRAMUSCULAR | Status: AC
  Filled 2021-03-23: qty 20

## 2021-03-23 MED ORDER — CHLORHEXIDINE GLUCONATE 0.12 % MT SOLN
15.0000 mL | Freq: Once | OROMUCOSAL | Status: AC
  Administered 2021-03-23: 15 mL via OROMUCOSAL
  Filled 2021-03-23: qty 15

## 2021-03-23 MED ORDER — ONDANSETRON HCL 4 MG/2ML IJ SOLN
INTRAMUSCULAR | Status: DC | PRN
  Administered 2021-03-23: 4 mg via INTRAVENOUS

## 2021-03-23 MED ORDER — HEPARIN SODIUM (PORCINE) 1000 UNIT/ML IJ SOLN
INTRAMUSCULAR | Status: DC | PRN
  Administered 2021-03-23: 13000 [IU] via INTRAVENOUS

## 2021-03-23 MED ORDER — MORPHINE SULFATE (PF) 2 MG/ML IV SOLN: 1.0000 mg | INTRAVENOUS | Status: DC | PRN

## 2021-03-23 MED ORDER — HEPARIN (PORCINE) IN NACL 1000-0.9 UT/500ML-% IV SOLN
INTRAVENOUS | Status: AC
  Filled 2021-03-23: qty 1500

## 2021-03-23 MED ORDER — PRAVASTATIN SODIUM 40 MG PO TABS
80.0000 mg | ORAL_TABLET | Freq: Every morning | ORAL | Status: DC
  Administered 2021-03-24: 80 mg via ORAL
  Filled 2021-03-23 (×2): qty 2

## 2021-03-23 MED ORDER — EZETIMIBE 10 MG PO TABS
10.0000 mg | ORAL_TABLET | Freq: Every day | ORAL | Status: DC
  Administered 2021-03-24: 10 mg via ORAL
  Filled 2021-03-23: qty 1

## 2021-03-23 MED ORDER — INSULIN ASPART 100 UNIT/ML IJ SOLN
0.0000 [IU] | Freq: Three times a day (TID) | INTRAMUSCULAR | Status: DC
  Administered 2021-03-24: 2 [IU] via SUBCUTANEOUS

## 2021-03-23 MED ORDER — SODIUM CHLORIDE 0.9 % IV SOLN
250.0000 mL | INTRAVENOUS | Status: DC
  Administered 2021-03-23: 250 mL via INTRAVENOUS

## 2021-03-23 MED ORDER — CHLORHEXIDINE GLUCONATE 4 % EX LIQD
30.0000 mL | CUTANEOUS | Status: DC
  Filled 2021-03-23: qty 30

## 2021-03-23 MED ORDER — INSULIN ASPART 100 UNIT/ML IJ SOLN: 0.0000 [IU] | INTRAMUSCULAR | Status: DC

## 2021-03-23 MED ORDER — PANTOPRAZOLE SODIUM 40 MG PO TBEC
40.0000 mg | DELAYED_RELEASE_TABLET | Freq: Every day | ORAL | Status: DC
  Administered 2021-03-24: 40 mg via ORAL
  Filled 2021-03-23: qty 1

## 2021-03-23 MED ORDER — CHLORHEXIDINE GLUCONATE 4 % EX LIQD
60.0000 mL | Freq: Once | CUTANEOUS | Status: DC
  Filled 2021-03-23: qty 60

## 2021-03-23 MED ORDER — POTASSIUM CHLORIDE CRYS ER 20 MEQ PO TBCR
40.0000 meq | EXTENDED_RELEASE_TABLET | Freq: Once | ORAL | Status: AC
  Administered 2021-03-23: 40 meq via ORAL
  Filled 2021-03-23: qty 2

## 2021-03-23 MED ORDER — SODIUM CHLORIDE 0.9% FLUSH: 3.0000 mL | Freq: Two times a day (BID) | INTRAVENOUS | Status: DC

## 2021-03-23 MED ORDER — IOHEXOL 350 MG/ML SOLN
INTRAVENOUS | Status: DC | PRN
  Administered 2021-03-23: 60 mL

## 2021-03-23 MED ORDER — CHLORHEXIDINE GLUCONATE 0.12 % MT SOLN
15.0000 mL | Freq: Once | OROMUCOSAL | Status: DC
  Filled 2021-03-23 (×2): qty 15

## 2021-03-23 MED ORDER — CEFAZOLIN SODIUM-DEXTROSE 2-4 GM/100ML-% IV SOLN
2.0000 g | Freq: Three times a day (TID) | INTRAVENOUS | Status: AC
  Administered 2021-03-23 – 2021-03-24 (×2): 2 g via INTRAVENOUS
  Filled 2021-03-23 (×2): qty 100

## 2021-03-23 MED ORDER — NITROGLYCERIN IN D5W 200-5 MCG/ML-% IV SOLN: 0.0000 ug/min | INTRAVENOUS | Status: DC

## 2021-03-23 MED ORDER — ONDANSETRON HCL 4 MG/2ML IJ SOLN: 4.0000 mg | Freq: Four times a day (QID) | INTRAMUSCULAR | Status: DC | PRN

## 2021-03-23 MED ORDER — SODIUM CHLORIDE 0.9 % IV SOLN: INTRAVENOUS | Status: DC

## 2021-03-23 MED ORDER — LACTATED RINGERS IV SOLN: INTRAVENOUS | Status: DC

## 2021-03-23 MED ORDER — ACETAMINOPHEN 325 MG PO TABS: 650.0000 mg | ORAL_TABLET | Freq: Four times a day (QID) | ORAL | Status: DC | PRN

## 2021-03-23 MED ORDER — SODIUM CHLORIDE 0.9 % IV SOLN: 250.0000 mL | INTRAVENOUS | Status: DC | PRN

## 2021-03-23 MED ORDER — SODIUM CHLORIDE 0.9 % IV SOLN: INTRAVENOUS | Status: AC

## 2021-03-23 MED ORDER — ORAL CARE MOUTH RINSE: 15.0000 mL | Freq: Once | OROMUCOSAL | Status: AC

## 2021-03-23 MED ORDER — PROPOFOL 500 MG/50ML IV EMUL
INTRAVENOUS | Status: DC | PRN
  Administered 2021-03-23: 20 ug/kg/min via INTRAVENOUS

## 2021-03-23 MED ORDER — LIDOCAINE HCL (PF) 1 % IJ SOLN
INTRAMUSCULAR | Status: DC | PRN
  Administered 2021-03-23: 15 mL

## 2021-03-23 MED ORDER — SODIUM CHLORIDE 0.9% FLUSH: 3.0000 mL | INTRAVENOUS | Status: DC | PRN

## 2021-03-23 MED ORDER — LATANOPROST 0.005 % OP SOLN
1.0000 [drp] | Freq: Every day | OPHTHALMIC | Status: DC
  Administered 2021-03-23: 1 [drp] via OPHTHALMIC
  Filled 2021-03-23: qty 2.5

## 2021-03-23 MED ORDER — PROTAMINE SULFATE 10 MG/ML IV SOLN
INTRAVENOUS | Status: DC | PRN
  Administered 2021-03-23 (×2): 30 mg via INTRAVENOUS
  Administered 2021-03-23 (×2): 20 mg via INTRAVENOUS
  Administered 2021-03-23: 30 mg via INTRAVENOUS

## 2021-03-23 SURGICAL SUPPLY — 30 items
BAG SNAP BAND KOVER 36X36 (MISCELLANEOUS) ×12 IMPLANT
CABLE ADAPT PACING TEMP 12FT (ADAPTER) ×3 IMPLANT
CATH 23 ULTRA DELIVERY (CATHETERS) ×3 IMPLANT
CATH DIAG 6FR PIGTAIL ANGLED (CATHETERS) ×6 IMPLANT
CATH INFINITI 6F AL1 (CATHETERS) ×3 IMPLANT
CATH S G BIP PACING (CATHETERS) ×3 IMPLANT
CLOSURE MYNX CONTROL 6F/7F (Vascular Products) ×3 IMPLANT
CLOSURE PERCLOSE PROSTYLE (VASCULAR PRODUCTS) ×6 IMPLANT
CRIMPER (MISCELLANEOUS) ×3 IMPLANT
DEVICE INFLATION ATRION QL2530 (MISCELLANEOUS) ×3 IMPLANT
ELECT DEFIB PAD ADLT CADENCE (PAD) ×3 IMPLANT
GUIDEWIRE SAFE TJ AMPLATZ EXST (WIRE) ×3 IMPLANT
KIT HEART LEFT (KITS) ×3 IMPLANT
KIT MICROPUNCTURE NIT STIFF (SHEATH) ×3 IMPLANT
KIT SAPIAN 3 ULTRA RESILIA 23 (Valve) ×3 IMPLANT
PACK CARDIAC CATHETERIZATION (CUSTOM PROCEDURE TRAY) ×3 IMPLANT
SHEATH BRITE TIP 7FR 35CM (SHEATH) ×3 IMPLANT
SHEATH INTRODUCER SET 20-26 (SHEATH) ×3 IMPLANT
SHEATH PINNACLE 6F 10CM (SHEATH) ×3 IMPLANT
SHEATH PINNACLE 8F 10CM (SHEATH) ×3 IMPLANT
SHEATH PROBE COVER 6X72 (BAG) ×3 IMPLANT
SLEEVE REPOSITIONING LENGTH 30 (MISCELLANEOUS) ×3 IMPLANT
STOPCOCK MORSE 400PSI 3WAY (MISCELLANEOUS) ×6 IMPLANT
SYR MEDRAD MARK V 150ML (SYRINGE) ×3 IMPLANT
TRANSDUCER W/STOPCOCK (MISCELLANEOUS) ×6 IMPLANT
TUBING CONTRAST HIGH PRESS 48 (TUBING) ×3 IMPLANT
WIRE AMPLATZ SS-J .035X180CM (WIRE) ×3 IMPLANT
WIRE EMERALD 3MM-J .035X150CM (WIRE) ×3 IMPLANT
WIRE EMERALD 3MM-J .035X260CM (WIRE) ×3 IMPLANT
WIRE EMERALD ST .035X260CM (WIRE) ×3 IMPLANT

## 2021-03-23 NOTE — Progress Notes (Signed)
  [facility] VALVE TEAM  Patient doing well s/p TAVR. He/She is hemodynamically stable. Groin sites stable. 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 Plan to DC arterial line and transfer to 4E. Plan for early ambulation after bedrest completed and hopeful discharge over the next 24-48 hours. K low and will supplement.   Angelena Form PA-C  [facility]  Pager [phone]

## 2021-03-23 NOTE — Interval H&P Note (Signed)
History and Physical Interval Note:  03/23/2021 12:35 PM  Joanna Brown  has presented today for surgery, with the diagnosis of Severe Aortic Stenosis.  The various methods of treatment have been discussed with the patient and family. After consideration of risks, benefits and other options for treatment, the patient has consented to  Procedure(s): TRANSCATHETER AORTIC VALVE REPLACEMENT, TRANSFEMORAL (N/A) TRANSESOPHAGEAL ECHOCARDIOGRAM (TEE) (N/A) as a surgical intervention.  The patient's history has been reviewed, patient examined, no change in status, stable for surgery.  I have reviewed the patient's chart and labs.  Questions were answered to the patient's satisfaction.     Gaye Pollack

## 2021-03-23 NOTE — Transfer of Care (Signed)
Immediate Anesthesia Transfer of Care Note  Patient: Joanna Brown  Procedure(s) Performed: TRANSCATHETER AORTIC VALVE REPLACEMENT, TRANSFEMORAL  Patient Location: Cath Lab  Anesthesia Type:MAC  Level of Consciousness: awake, alert  and oriented  Airway & Oxygen Therapy: Patient Spontanous Breathing and Patient connected to nasal cannula oxygen  Post-op Assessment: Report given to RN  Post vital signs: Reviewed and stable  Last Vitals:  Vitals Value Taken Time  BP    Temp 36.2 C 03/23/21 1531  Pulse 56 03/23/21 1533  Resp 23 03/23/21 1533  SpO2 98 % 03/23/21 1533  Vitals shown include unvalidated device data.  Last Pain:  Vitals:   03/23/21 1531  TempSrc: Tympanic  PainSc: 0-No pain         Complications: No notable events documented.

## 2021-03-23 NOTE — Progress Notes (Signed)
Echocardiogram 2D Echocardiogram has been performed.  Oneal Deputy Michell Giuliano RDCS 03/23/2021, 3:20 PM

## 2021-03-23 NOTE — Progress Notes (Signed)
Patient admitted to 4E. VS are stable. CHG bath given. Pt is A/O x4. Will continue to monitor.  Raelyn Number, RN 03/23/21 5:09 PM

## 2021-03-23 NOTE — Progress Notes (Signed)
  [facility] VALVE TEAM  Patient doing well s/p TAVR. She is hemodynamically stable. Groin sites stable. ECG with no high grade block. Plan to DC arterial line and transfer to 4E. Plan for early ambulation after bedrest completed and hopeful discharge over the next 24-48 hours.   Kathyrn Drown NP-C Structural Heart Team  Pager: [phone]

## 2021-03-23 NOTE — Anesthesia Preprocedure Evaluation (Signed)
Anesthesia Evaluation  Patient identified by MRN, date of birth, ID band Patient awake    Reviewed: Allergy & Precautions, NPO status , Patient's Chart, lab work & pertinent test results  Airway Mallampati: II  TM Distance: >3 FB Neck ROM: Full    Dental no notable dental hx.    Pulmonary neg pulmonary ROS,    Pulmonary exam normal        Cardiovascular hypertension, Pt. on medications + CAD  + Valvular Problems/Murmurs AS  Rhythm:Regular Rate:Normal + Systolic murmurs    Neuro/Psych negative neurological ROS  negative psych ROS   GI/Hepatic Neg liver ROS, GERD  Medicated,  Endo/Other  diabetes, Type 2, Insulin Dependent, Oral Hypoglycemic Agents  Renal/GU negative Renal ROS  negative genitourinary   Musculoskeletal  (+) Arthritis , Osteoarthritis,    Abdominal Normal abdominal exam  (+)   Peds  Hematology negative hematology ROS (+)   Anesthesia Other Findings   Reproductive/Obstetrics                             Anesthesia Physical Anesthesia Plan  ASA: 4  Anesthesia Plan: MAC   Post-op Pain Management:    Induction: Intravenous  PONV Risk Score and Plan: 2 and Ondansetron, Dexamethasone, Propofol infusion, Treatment may vary due to age or medical condition and TIVA  Airway Management Planned: Simple Face Mask, Natural Airway and Nasal Cannula  Additional Equipment: None  Intra-op Plan:   Post-operative Plan:   Informed Consent: I have reviewed the patients History and Physical, chart, labs and discussed the procedure including the risks, benefits and alternatives for the proposed anesthesia with the patient or authorized representative who has indicated his/her understanding and acceptance.     Dental advisory given  Plan Discussed with: CRNA  Anesthesia Plan Comments: (ECHO 09/22: 1. Left ventricular ejection fraction, by estimation, is 55 to 60%. The  left  ventricle has normal function. The left ventricle has no regional  wall motion abnormalities. Left ventricular diastolic parameters are  consistent with Grade I diastolic  dysfunction (impaired relaxation). The average left ventricular global  longitudinal strain is -16.1 %. The global longitudinal strain is  borderline reduced.  2. Right ventricular systolic function is normal. The right ventricular  size is normal. The estimated right ventricular systolic pressure is 99.7  mmHg.  3. Left atrial size was mildly dilated.  4. The mitral valve is degenerative. Mild mitral valve regurgitation.  Moderate to severe mitral annular calcification.  5. The aortic valve is tricuspid. There is moderate calcification of the  aortic valve. There is severe thickening of the aortic valve. Aortic valve  regurgitation is not visualized. Moderate to severe aortic valve stenosis.   CT coronary 09/22: IMPRESSION: 1. Tricuspid aortic valve. Severely reduced cusp separation. Moderately thickened, severely calcified aortic valve cusps.  2. AV calcium score: 1144  3. Annulus area: 400 mm2, no significant LVOT calcifications. The annulus would be suitable for a 23 mm Sapien 3 valve.  4. Sufficient coronary artery heights from annulus for deployment.  5. Optimum Fluoroscopic Angle for Delivery: LAO 4 CAU 4  6. Severe mitral annular calcification.  Lab Results      Component                Value               Date  WBC                      7.8                 03/22/2021                HGB                      12.7                03/22/2021                HCT                      38.1                03/22/2021                MCV                      86.2                03/22/2021                PLT                      201                 03/22/2021           Lab Results      Component                Value               Date                      NA                       134 (L)              03/22/2021                K                        3.3 (L)             03/22/2021                CO2                      21 (L)              03/22/2021                GLUCOSE                  105 (H)             03/22/2021                BUN                      18                  03/22/2021                CREATININE  1.07 (H)            03/22/2021                CALCIUM                  10.0                03/22/2021                EGFR                     52 (L)              01/20/2021                GFRNONAA                 53 (L)              03/22/2021          )        Anesthesia Quick Evaluation

## 2021-03-23 NOTE — Progress Notes (Signed)
Consult received for US guided PIV for vasopressors.  Secure chat with Rene Kocher, RN regarding need.  Patient is not on vasopressors at this time, only during cath lab.  Order completed.

## 2021-03-23 NOTE — Anesthesia Procedure Notes (Signed)
Procedure Name: MAC Date/Time: 03/23/2021 1:59 PM Performed by: Barrington Ellison, CRNA Pre-anesthesia Checklist: Patient identified, Emergency Drugs available, Suction available, Patient being monitored and Timeout performed Patient Re-evaluated:Patient Re-evaluated prior to induction Oxygen Delivery Method: Simple face mask

## 2021-03-23 NOTE — Op Note (Signed)
[facility]   MULTIDISCIPLINARY HEART VALVE TEAM   TAVR OPERATIVE NOTE   Date of Procedure:  03/23/2021  Preoperative Diagnosis: Severe Aortic Stenosis   Postoperative Diagnosis: Same   Procedure:   Transcatheter Aortic Valve Replacement - Percutaneous Right Transfemoral Approach  Edwards Sapien 3 Ultra RSL THV (size 23 mm, model # 6578IO:, serial # [id number])   Co-Surgeons:  Gaye Pollack, MD and  Lauree Chandler, MD   Anesthesiologist:  G. Bostonia, DO  Echocardiographer:  M. Croitoru, MD  Pre-operative Echo Findings: Severe aortic stenosis Normal left ventricular systolic function  Post-operative Echo Findings: No paravalvular leak Normal left ventricular systolic function   BRIEF CLINICAL NOTE AND INDICATIONS FOR SURGERY  This 80 year old woman has stage D, severe, symptomatic, low-flow/low gradient aortic stenosis with New York Heart Association class II symptoms of exertional fatigue and shortness of breath consistent with chronic diastolic congestive heart failure. She has also had exertional dizziness and chest tightness. I have personally reviewed her 2D echocardiogram, cardiac catheterization, and CTA studies. Echocardiogram shows a trileaflet aortic valve with moderate calcification and thickening of the leaflets with restricted mobility. The mean gradient was measured at 25 mmHg with a valve area of 0.3 cm. Left ventricular systolic function was normal. Her prior echocardiogram in January 2022 measured a mean aortic valve gradient of 32.5 mmHg with a valve area of 0.77 cm. Cardiac catheterization shows mild nonobstructive coronary disease. The mean gradient was measured at 27 mmHg with a peak gradient of 30 mmHg. I agree that aortic valve replacement is indicated in this patient for relief of her symptoms and to prevent progressive left ventricular deterioration. Her gated cardiac CTA shows anatomy suitable for TAVR using a 23 mm SAPIEN 3 valve.  The aortic valve appears trileaflet with severely reduced cusp separation with moderately thickened and severely calcified leaflets. There is severe mitral annular calcification. Given her age I think that transcatheter aortic valve replacement would be the best treatment for her. Her abdominal and pelvic CTA shows adequate pelvic vascular anatomy to allow transfemoral insertion.  The patient and her husband were counseled at length regarding treatment alternatives for management of severe symptomatic aortic stenosis. The risks and benefits of surgical intervention has been discussed in detail. Long-term prognosis with medical therapy was discussed. Alternative approaches such as conventional surgical aortic valve replacement, transcatheter aortic valve replacement, and palliative medical therapy were compared and contrasted at length. This discussion was placed in the context of the patient's own specific clinical presentation and past medical history. All of their questions have been addressed.  Following the decision to proceed with transcatheter aortic valve replacement, a discussion was held regarding what types of management strategies would be attempted intraoperatively in the event of life-threatening complications, including whether or not the patient would be considered a candidate for the use of cardiopulmonary bypass and/or conversion to open sternotomy for attempted surgical intervention. She is in overall good condition for her age and I think she would be a candidate for emergent sternotomy to manage any intraoperative complications. The patient is aware of the fact that transient use of cardiopulmonary bypass may be necessary. The patient has been advised of a variety of complications that might develop including but not limited to risks of death, stroke, paravalvular leak, aortic dissection or other major vascular complications, aortic annulus rupture, device embolization, cardiac rupture or  perforation, mitral regurgitation, acute myocardial infarction, arrhythmia, heart block or bradycardia requiring permanent pacemaker placement, congestive heart failure, respiratory failure, renal  failure, pneumonia, infection, other late complications related to structural valve deterioration or migration, or other complications that might ultimately cause a temporary or permanent loss of functional independence or other long term morbidity. The patient provides full informed consent for the procedure as described and all questions were answered.    DETAILS OF THE OPERATIVE PROCEDURE  PREPARATION:    The patient was brought to the operating room on the above mentioned date and appropriate monitoring was established by the anesthesia team. The patient was placed in the supine position on the operating table.  Intravenous antibiotics were administered. The patient was monitored closely throughout the procedure under conscious sedation.    Baseline transthoracic echocardiogram was performed. The patient's abdomen and both groins were prepped and draped in a sterile manner. A time out procedure was performed.   PERIPHERAL ACCESS:    Using the modified Seldinger technique, femoral arterial and venous access was obtained with placement of 6 Fr sheaths on the left side.  A pigtail diagnostic catheter was passed through the left arterial sheath under fluoroscopic guidance into the aortic root.  A temporary transvenous pacemaker catheter was passed through the left femoral venous sheath under fluoroscopic guidance into the right ventricle.  The pacemaker was tested to ensure stable lead placement and pacemaker capture. Aortic root angiography was performed in order to determine the optimal angiographic angle for valve deployment.   TRANSFEMORAL ACCESS:   Percutaneous transfemoral access and sheath placement was performed using ultrasound guidance.  The right common femoral artery was cannulated using a  micropuncture needle and appropriate location was verified using hand injection angiogram.  A pair of Abbott Perclose percutaneous closure devices were placed and a 6 French sheath replaced into the femoral artery.  The patient was heparinized systemically and ACT verified > 250 seconds.    A 14 Fr transfemoral E-sheath was introduced into the right common femoral artery after progressively dilating over an Amplatz superstiff wire. An AL-1 catheter was used to direct a straight-tip exchange length wire across the native aortic valve into the left ventricle. This was exchanged out for a pigtail catheter and position was confirmed in the LV apex. Simultaneous LV and Ao pressures were recorded.  The pigtail catheter was exchanged for an Amplatz Extra-stiff wire in the LV apex.    BALLOON AORTIC VALVULOPLASTY:   Not performed    TRANSCATHETER HEART VALVE DEPLOYMENT:   An Edwards Sapien 3 Ultra transcatheter heart valve (size 23 mm) was prepared and crimped per manufacturer's guidelines, and the proper orientation of the valve is confirmed on the [employer] delivery system. The valve was advanced through the introducer sheath using normal technique until in an appropriate position in the abdominal aorta beyond the sheath tip. The balloon was then retracted and using the fine-tuning wheel was centered on the valve. The valve was then advanced across the aortic arch using appropriate flexion of the catheter. The valve was carefully positioned across the aortic valve annulus. The Commander catheter was retracted using normal technique. Once final position of the valve has been confirmed by angiographic assessment, the valve is deployed while temporarily holding ventilation and during rapid ventricular pacing to maintain systolic blood pressure < 50 mmHg and pulse pressure < 10 mmHg. The balloon inflation is held for >3 seconds after reaching full deployment volume. Once the balloon has fully deflated the  balloon is retracted into the ascending aorta and valve function is assessed using echocardiography. There is felt to be no paravalvular leak and  no central aortic insufficiency.  The patient's hemodynamic recovery following valve deployment is good.  The deployment balloon and guidewire are both removed.    PROCEDURE COMPLETION:   The sheath was removed and femoral artery closure performed.  Protamine was administered once femoral arterial repair was complete. The temporary pacemaker, pigtail catheters and femoral sheaths were removed with manual pressure used for hemostasis.  A Mynx femoral closure device was utilized following removal of the diagnostic sheath in the left femoral artery.  The patient tolerated the procedure well and is transported to the cath lab recovery area in stable condition. There were no immediate intraoperative complications. All sponge instrument and needle counts are verified correct at completion of the operation.   noNo blood products were administered during the operation.  The patient received a total of 60 mL of intravenous contrast during the procedure.   Gaye Pollack, MD 03/23/2021

## 2021-03-23 NOTE — CV Procedure (Signed)
[facility]  TAVR OPERATIVE NOTE   Date of Procedure:  03/23/2021  Preoperative Diagnosis: Severe Aortic Stenosis   Postoperative Diagnosis: Same   Procedure:   Transcatheter Aortic Valve Replacement - Transfemoral Approach  Edwards Sapien 3 THV (size 23 mm, model #[id number], serial # [id number])   Co-Surgeons:  Lauree Chandler, MD and Gaye Pollack, MD   Anesthesiologist:  Stoltzfus  Echocardiographer:  Croitoru  Pre-operative Echo Findings: Severe aortic stenosis Normal left ventricular systolic function  Post-operative Echo Findings: No paravalvular leak Normal left ventricular systolic function  BRIEF CLINICAL NOTE AND INDICATIONS FOR SURGERY  80 yo female with history of diabetes, GERD, mild CAD, hyperlipidemia, HTN and severe aortic stenosis. Echo in January 2022 with LVEF=65-70%, mild LVH. Trivial MR. The aortic valve leaflets are thickened and calcified with limited leaflet excursion. Mean gradient 32.5 mmHg, peak gradient 51.7 mmHg, AVA 0.77 cm2, dimensionless index 0.30, SVI 33. This is consistent with moderately severe aortic stenosis with possible paradoxical low flow/low gradient aortic stenosis. Cardiac cath May 2022 with mild CAD, normal cardiac output, normal right and left heart pressures. Mean gradient across the aortic valve 26.6 mmHg by cath.Echo today with LVEF=55-60%. Moderate to severe aortic stenosis with mean gradient 25 mmHg, peak gradient 43.6 mmHg, AVA 0.83 cm2, dimensionless index 0.26. SVI 30. This is consistent with severe AS. Cardiac cath with mild CAD. Cardiac CT with anatomy appropriate for 23 mm Edwards Sapien 3 valve with transfemoral access. She fatigue with heavy exertion and dyspnea on exertion.  During the course of the patient's preoperative work up they have been evaluated comprehensively by a multidisciplinary team of specialists coordinated through the [facility].  They have been demonstrated to suffer from symptomatic severe aortic stenosis as noted above. The patient has been counseled extensively as to the relative risks and benefits of all options for the treatment of severe aortic stenosis including long term medical therapy, conventional surgery for aortic valve replacement, and transcatheter aortic valve replacement.  The patient has been independently evaluated by Dr. Cyndia Bent with CT surgery and they are felt to be at high risk for conventional surgical aortic valve replacement. The surgeon indicated the patient would be a poor candidate for conventional surgery. Based upon review of all of the patient's preoperative diagnostic tests they are felt to be candidate for transcatheter aortic valve replacement using the transfemoral approach as an alternative to high risk conventional surgery.    Following the decision to proceed with transcatheter aortic valve replacement, a discussion has been held regarding what types of management strategies would be attempted intraoperatively in the event of life-threatening complications, including whether or not the patient would be considered a candidate for the use of cardiopulmonary bypass and/or conversion to open sternotomy for attempted surgical intervention.  The patient has been advised of a variety of complications that might develop peculiar to this approach including but not limited to risks of death, stroke, paravalvular leak, aortic dissection or other major vascular complications, aortic annulus rupture, device embolization, cardiac rupture or perforation, acute myocardial infarction, arrhythmia, heart block or bradycardia requiring permanent pacemaker placement, congestive heart failure, respiratory failure, renal failure, pneumonia, infection, other late complications related to structural valve deterioration or migration, or other complications that might ultimately cause a temporary or permanent  loss of functional independence or other long term morbidity.  The patient provides full informed consent for the procedure as described and all questions were  answered preoperatively.    DETAILS OF THE OPERATIVE PROCEDURE  PREPARATION:   The patient is brought to the operating room on the above mentioned date and central monitoring was established by the anesthesia team including placement of a radial arterial line. The patient is placed in the supine position on the operating table.  Intravenous antibiotics are administered. Conscious sedation is used.   Baseline transthoracic echocardiogram was performed. The patient's chest, abdomen, both groins, and both lower extremities are prepared and draped in a sterile manner. A time out procedure is performed.   PERIPHERAL ACCESS:   Using the modified Seldinger technique, femoral arterial and venous access were obtained with placement of a 6 Fr sheath in the artery and a 7 Fr sheath in the vein on the left side using u/s guidance.  A pigtail diagnostic catheter was passed through the femoral arterial sheath under fluoroscopic guidance into the aortic root.  A temporary transvenous pacemaker catheter was passed through the femoral venous sheath under fluoroscopic guidance into the right ventricle.  The pacemaker was tested to ensure stable lead placement and pacemaker capture. Aortic root angiography was performed in order to determine the optimal angiographic angle for valve deployment.  TRANSFEMORAL ACCESS:  A micropuncture kit was used to gain access to the right femoral artery using u/s guidance. Position confirmed with angiography. Pre-closure with double ProGlide closure devices. The patient was heparinized systemically and ACT verified > 250 seconds.    A 14 Fr transfemoral E-sheath was introduced into the right femoral artery after progressively dilating over an Amplatz superstiff wire. An AL-1 catheter was used to direct a straight-tip exchange  length wire across the native aortic valve into the left ventricle. This was exchanged out for a pigtail catheter and position was confirmed in the LV apex. Simultaneous LV and Ao pressures were recorded.  The pigtail catheter was then exchanged for an Amplatz Extra-stiff wire in the LV apex.   TRANSCATHETER HEART VALVE DEPLOYMENT:  An Edwards Sapien 3 THV (size 23 mm) was prepared and crimped per manufacturer's guidelines, and the proper orientation of the valve is confirmed on the [employer] delivery system. The valve was advanced through the introducer sheath using normal technique until in an appropriate position in the abdominal aorta beyond the sheath tip. The balloon was then retracted and using the fine-tuning wheel was centered on the valve. The valve was then advanced across the aortic arch using appropriate flexion of the catheter. The valve was carefully positioned across the aortic valve annulus. The Commander catheter was retracted using normal technique. Once final position of the valve has been confirmed by angiographic assessment, the valve is deployed while temporarily holding ventilation and during rapid ventricular pacing to maintain systolic blood pressure < 50 mmHg and pulse pressure < 10 mmHg. The balloon inflation is held for >3 seconds after reaching full deployment volume. Once the balloon has fully deflated the balloon is retracted into the ascending aorta and valve function is assessed using TTE. There is felt to be no paravalvular leak and no central aortic insufficiency.  The patient's hemodynamic recovery following valve deployment is good.  The deployment balloon and guidewire are both removed. Echo demostrated acceptable post-procedural gradients, stable mitral valve function, and no AI.   PROCEDURE COMPLETION:  The sheath was then removed and closure devices were completed. Protamine was administered once femoral arterial repair was complete. The temporary pacemaker,  pigtail catheters and femoral sheaths were removed with a Mynx closure device placed in the  artery and manual pressure used for venous hemostasis.    The patient tolerated the procedure well and is transported to the surgical intensive care in stable condition. There were no immediate intraoperative complications. All sponge instrument and needle counts are verified correct at completion of the operation.   No blood products were administered during the operation.  The patient received a total of 60 mL of intravenous contrast during the procedure.  Lauree Chandler MD 03/23/2021 3:16 PM

## 2021-03-23 NOTE — Anesthesia Postprocedure Evaluation (Signed)
Anesthesia Post Note  Patient: Joanna Brown  Procedure(s) Performed: TRANSCATHETER AORTIC VALVE REPLACEMENT, TRANSFEMORAL     Patient location during evaluation: PACU Anesthesia Type: MAC Level of consciousness: awake and alert Pain management: pain level controlled Vital Signs Assessment: post-procedure vital signs reviewed and stable Respiratory status: spontaneous breathing, nonlabored ventilation, respiratory function stable and patient connected to nasal cannula oxygen Cardiovascular status: stable and blood pressure returned to baseline Postop Assessment: no apparent nausea or vomiting Anesthetic complications: no   No notable events documented.  Last Vitals:  Vitals:   03/23/21 1900 03/23/21 2000  BP: (!) 114/58 113/60  Pulse: (!) 49 (!) 55  Resp: 19 20  Temp:  (!) 36.4 C  SpO2: 96% 99%    Last Pain:  Vitals:   03/23/21 2000  TempSrc: Oral  PainSc: 0-No pain                 Belenda Cruise P Miyeko Mahlum

## 2021-03-24 ENCOUNTER — Inpatient Hospital Stay (HOSPITAL_COMMUNITY): Payer: Medicare Other

## 2021-03-24 DIAGNOSIS — Z952 Presence of prosthetic heart valve: Secondary | ICD-10-CM

## 2021-03-24 DIAGNOSIS — Z20822 Contact with and (suspected) exposure to covid-19: Secondary | ICD-10-CM | POA: Diagnosis not present

## 2021-03-24 DIAGNOSIS — I350 Nonrheumatic aortic (valve) stenosis: Principal | ICD-10-CM

## 2021-03-24 DIAGNOSIS — Z006 Encounter for examination for normal comparison and control in clinical research program: Secondary | ICD-10-CM | POA: Diagnosis not present

## 2021-03-24 DIAGNOSIS — E1122 Type 2 diabetes mellitus with diabetic chronic kidney disease: Secondary | ICD-10-CM | POA: Diagnosis not present

## 2021-03-24 LAB — ECHOCARDIOGRAM COMPLETE
AR max vel: 1.16 cm2
AV Area VTI: 1.11 cm2
AV Area mean vel: 1.19 cm2
AV Mean grad: 17 mmHg
AV Peak grad: 28.3 mmHg
Ao pk vel: 2.66 m/s
Area-P 1/2: 3.63 cm2
Calc EF: 61.7 %
Height: 65 in
MV VTI: 1.43 cm2
S' Lateral: 2.6 cm
Single Plane A2C EF: 63.2 %
Single Plane A4C EF: 61.5 %
Weight: 2923.2 oz

## 2021-03-24 LAB — BASIC METABOLIC PANEL
Anion gap: 10 (ref 5–15)
BUN: 17 mg/dL (ref 8–23)
CO2: 27 mmol/L (ref 22–32)
Calcium: 9.1 mg/dL (ref 8.9–10.3)
Chloride: 98 mmol/L (ref 98–111)
Creatinine, Ser: 1.09 mg/dL — ABNORMAL HIGH (ref 0.44–1.00)
GFR, Estimated: 52 mL/min — ABNORMAL LOW (ref >60)
Glucose, Bld: 151 mg/dL — ABNORMAL HIGH (ref 70–99)
Potassium: 3.8 mmol/L (ref 3.5–5.1)
Sodium: 135 mmol/L (ref 135–145)

## 2021-03-24 LAB — CBC
HCT: 35 % — ABNORMAL LOW (ref 36.0–46.0)
Hemoglobin: 11.6 g/dL — ABNORMAL LOW (ref 12.0–15.0)
MCH: 28.5 pg (ref 26.0–34.0)
MCHC: 33.1 g/dL (ref 30.0–36.0)
MCV: 86 fL (ref 80.0–100.0)
Platelets: 139 10*3/uL — ABNORMAL LOW (ref 150–400)
RBC: 4.07 MIL/uL (ref 3.87–5.11)
RDW: 13.1 % (ref 11.5–15.5)
WBC: 6.9 10*3/uL (ref 4.0–10.5)
nRBC: 0 % (ref 0.0–0.2)

## 2021-03-24 LAB — GLUCOSE, CAPILLARY
Glucose-Capillary: 147 mg/dL — ABNORMAL HIGH (ref 70–99)
Glucose-Capillary: 177 mg/dL — ABNORMAL HIGH (ref 70–99)

## 2021-03-24 MED ORDER — PERFLUTREN LIPID MICROSPHERE
1.0000 mL | INTRAVENOUS | Status: AC | PRN
Start: 2021-03-24 — End: 2021-03-24
  Administered 2021-03-24: 2 mL via INTRAVENOUS
  Filled 2021-03-24: qty 10

## 2021-03-24 MED FILL — Lidocaine HCl Local Inj 1%: INTRAMUSCULAR | Qty: 15 | Status: AC

## 2021-03-24 NOTE — Plan of Care (Signed)

## 2021-03-24 NOTE — Progress Notes (Signed)
Mobility Specialist: Progress Note   03/24/21 1159  Mobility  Activity Ambulated in hall  Level of Assistance Independent  Assistive Device None  Distance Ambulated (ft) 380 ft  Mobility Ambulated independently in hallway  Mobility Response Tolerated well  Mobility performed by Mobility specialist  $Mobility charge 1 Mobility   Post-Mobility: 90 HR  Pt had no c/o throughout ambulation. Pt back to bed after walk with call bell and phone in reach.   [facility] Strine Mobility Specialist Mobility Specialist Phone: [phone]

## 2021-03-24 NOTE — Progress Notes (Signed)
CARDIAC REHAB PHASE I   PRE:  Rate/Rhythm: 91 SR  BP:  Sitting: 131/66      SaO2: 95 RA  MODE:  Ambulation: 200 ft   POST:  Rate/Rhythm: 103 ST  BP:  Sitting: 137/81    SaO2: 94 RA   Pt ambulated 28ft in hallway assist of one with slow gait. Pt with several minor LOB, but able to correct self. Pt denies CP, SOB, or dizziness. Breathing much improved since procedure. Pt returned to EOB. Educated on importance of site care, restrictions, and exercise guidelines.Pt declines CRP II at this time. Hopeful for d/c today.  [id number] Rufina Falco, RN BSN 03/24/2021 9:36 AM

## 2021-03-24 NOTE — Discharge Summary (Addendum)
[facility]VE TEAM  Discharge Summary    Patient ID: Joanna Brown MRN: [id number]; DOB: 10-24-1940  Admit date: 03/23/2021 Discharge date: 03/24/2021  Primary Care Provider: Fayrene Helper, MD  Primary Cardiologist: [name], MD /Dr. Angelena Form, MD & Dr. Cyndia Bent, MD (TAVR)  Discharge Diagnoses    Principal Problem:   S/P TAVR (transcatheter aortic valve replacement) Active Problems:   Type 2 diabetes mellitus with stage 1 chronic kidney disease, with long-term current use of insulin (HCC)   Overweight (BMI 25.0-29.9)   Essential hypertension   Severe aortic stenosis   History of colon cancer   GERD (gastroesophageal reflux disease)  Allergies Allergies  Allergen Reactions   Senokot Wheat Bran [Wheat Bran]     ABDOMINAL CRAMPS   Spironolactone     Stomach problems, vision changes     Diagnostic Studies/Procedures    TAVR OPERATIVE NOTE     Date of Procedure:                03/23/2021   Preoperative Diagnosis:      Severe Aortic Stenosis    Postoperative Diagnosis:    Same    Procedure:        Transcatheter Aortic Valve Replacement - Percutaneous Right Transfemoral Approach             Edwards Sapien 3 Ultra RSL THV (size 23 mm, model # 9755RS:, serial # [id number])              Co-Surgeons:                        Gaye Pollack, MD and  Lauree Chandler, MD     Anesthesiologist:                  Maryclare Bean, DO   Echocardiographer:              Bertrum Sol, MD   Pre-operative Echo Findings: Severe aortic stenosis Normal left ventricular systolic function   Post-operative Echo Findings: No paravalvular leak Normal left ventricular systolic function _____________   Echo 03/24/21: Completed but pending formal read at the time of discharge   History of Present Illness     Joanna Brown is a 80 y.o. female with a history of DM2, GERD, mild CAD, HLD, HTN and aortic stenosis who was referred to  Dr. Angelena Form 01/20/21 for the evaluation of aortic stenosis and possible TAVR. Echocardiogram performed  05/2020 showed an LVEF at 65-70%, mild LVH, trivial MR. The aortic valve leaflets were thickened and calcified with limited leaflet excursion. Mean gradient at 32.5 mmHg, peak gradient 51.7 mmHg, AVA 0.77 cm2, dimensionless index 0.30, SVI 33, consistent with moderately severe aortic stenosis with possible paradoxical low flow/low gradient aortic stenosis. He was referred for further workup and underwent a cardiac catheterization 09/2020 which showed mild CAD, normal cardiac output, normal right and left heart pressures. Mean gradient across the aortic valve 26.6 mmHg by cath. Repeat echo on 01/20/21 showed an  LVEF at 55-60%, moderate to severe aortic stenosis with mean gradient 25 mmHg, peak gradient 43.6 mmHg, AVA 0.83 cm2, dimensionless index 0.26. SVI 30. At the time of evaluation, she was having fatigue and DOE on exertion.  The patient was evaluated by the multidisciplinary valve team and felt to have severe, symptomatic aortic stenosis and to be a suitable candidate for TAVR, which was set up for 03/23/21.  Hospital Course    1. Severe AS: s/p successful TAVR with a 23 mm Edwards Sapien 3 THV via the TF approach on 03/23/21. Post operative echo pending. Groin sites are stable. ECG with NSR and no high grade heart block. Medication plan is to continue ASA monotherapy. Post procedure site instructions reviewed. SBE to be discussed at follow up in one week. Post TAVR Hb and Cr stable. The patient has ambulated with CR without complication.   2. HTN: 130/55, stable with no changes  3. DM2: SSI for glucose control while inpatient. To resume home DM regimen   4. GERD: No changes.   Consultants: None   The patient was seen and examined by Dr. Angelena Form who feels that she is stable and ready for discharge today, 03/24/21.  _____________  Discharge Vitals Blood pressure (!) 130/55, pulse 87,  temperature 99.7 F (37.6 C), temperature source Oral, resp. rate 20, height _0  (1.651 m), weight 82.9 kg, SpO2 97 %.  Filed Weights   03/23/21 1017 03/23/21 1845 03/24/21 0149  Weight: 85 kg 81 kg 82.9 kg   General: Well developed, well nourished, NAD Lungs:Clear to ausculation bilaterally. No wheezes, rales, or rhonchi. Breathing is unlabored. Cardiovascular: RRR with S1 S2. Soft flow murmur Extremities: No edema. Neuro: Alert and oriented. No focal deficits. No facial asymmetry. MAE spontaneously. Psych: Responds to questions appropriately with normal affect.    Labs & Radiologic Studies    CBC Recent Labs    03/22/21 1128 03/23/21 1416 03/23/21 1548 03/24/21 0202  WBC 7.8  --   --  6.9  HGB 12.7   < > 10.9* 11.6*  HCT 38.1   < > 32.0* 35.0*  MCV 86.2  --   --  86.0  PLT 201  --   --  139*   < > = values in this interval not displayed.   Basic Metabolic Panel Recent Labs    03/22/21 1128 03/23/21 1416 03/23/21 1548 03/24/21 0202  NA 134*   < > 139 135  K 3.3*   < > 3.2* 3.8  CL 100  --  101 98  CO2 21*  --   --  27  GLUCOSE 105*  --  122* 151*  BUN 18  --  17 17  CREATININE 1.07*  --  1.00 1.09*  CALCIUM 10.0  --   --  9.1   < > = values in this interval not displayed.   Liver Function Tests Recent Labs    03/22/21 1128  AST 26  ALT 22  ALKPHOS 47  BILITOT 0.8  PROT 7.4  ALBUMIN 3.7   No results for input(s): LIPASE, AMYLASE in the last 72 hours. Cardiac Enzymes No results for input(s): CKTOTAL, CKMB, CKMBINDEX, TROPONINI in the last 72 hours. BNP Invalid input(s): POCBNP D-Dimer No results for input(s): DDIMER in the last 72 hours. Hemoglobin A1C Recent Labs    03/22/21 1130  HGBA1C 7.4*   Fasting Lipid Panel No results for input(s): CHOL, HDL, LDLCALC, TRIG, CHOLHDL, LDLDIRECT in the last 72 hours. Thyroid Function Tests No results for input(s): TSH, T4TOTAL, T3FREE, THYROIDAB in the last 72 hours.  Invalid input(s):  FREET3 _____________  DG Chest 2 View  Result Date: 03/23/2021 CLINICAL DATA:  Preoperative evaluation for transcatheter aortic valve replacement, transfemoral scheduled for 03/23/2021. EXAM: CHEST - 2 VIEW COMPARISON:  CTA chest - 01/28/2021; X-ray chest - 08/12/2013 . FINDINGS: Lung volumes are normal. No consolidative airspace disease. No pleural effusions. No pneumothorax.  No pulmonary nodule or mass noted. Pulmonary vasculature and the cardiomediastinal silhouette are within normal limits. Atherosclerosis in the thoracic aorta. IMPRESSION: 1.  No radiographic evidence of acute cardiopulmonary disease. 2. Aortic atherosclerosis. Electronically Signed   By: Vinnie Langton M.D.   On: 03/23/2021 15:29   ECHOCARDIOGRAM LIMITED  Result Date: 03/23/2021    ECHOCARDIOGRAM LIMITED REPORT   Patient Name:   Joanna Brown Goldinger Date of Exam: 03/23/2021 Medical Rec #:  [id number]        Height:       65.0 in Accession #:    [id number]       Weight:       187.4 lb Date of Birth:  07-24-1940         BSA:          1.924 m Patient Age:    76 years         BP:           154/82 mmHg Patient Gender: F                HR:           72 bpm. Exam Location:  Inpatient Procedure: Limited Echo, Color Doppler and Cardiac Doppler Indications:     Aortic Stenosis i35.0  History:         Patient has prior history of Echocardiogram examinations, most                  recent 01/20/2021. Risk Factors:Hypertension, Diabetes and                  Dyslipidemia.  Sonographer:     Raquel Sarna Senior RDCS Referring Phys:  [id number] Woodfin Ganja THOMPSON Diagnosing Phys: Sanda Klein MD  Sonographer Comments: 30m Edwards S3U TAVR Implanted PREOPERATIVE STUDY Normal left ventricular regional wall motion and overall systolic function. EF 65%. Trileaflet aortic valve with severe calcific changes. There is severe aortic stenosis (paradoxical low flow low gradient). Peak gradient 40 mm Hg, mean gradient 25 mm Hg, dimensionless index 0.23, calculated aortic valve  area 0.6 cm sq, stroke volume index 24 ml/m sq. There is no aortic insufficiency. There is no mitral insufficiency. There is no pericardial effusion. POSTOPERATIVE STUDY Normal left ventricular regional wall motion and overall systolic function. EF 65%. Well seated TAVR stent valve (23 mm Edwards S3U) without aortic insufficiency or pervalvular leak. Peak gradient 10 mm Hg, mean gradient 6 mm Hg, dimensionless index 0.63, calculated aortic valve area 1.7 cm sq, acceleration time 60 ms. There is no aortic insufficiency. There is no mitral insufficiency. There is no pericardial effusion.  IMPRESSIONS  1. Left ventricular ejection fraction, by estimation, is 60 to 65%. The left ventricle has normal function. The left ventricle has no regional wall motion abnormalities. There is mild concentric left ventricular hypertrophy.  2. Left atrial size was mildly dilated.  3. The mitral valve is degenerative. Moderate to severe mitral annular calcification.  4. The aortic valve is tricuspid. There is moderate calcification of the aortic valve. There is moderate thickening of the aortic valve. Aortic valve regurgitation is not visualized. FINDINGS  Left Ventricle: Left ventricular ejection fraction, by estimation, is 60 to 65%. The left ventricle has normal function. The left ventricle has no regional wall motion abnormalities. There is mild concentric left ventricular hypertrophy. Left Atrium: Left atrial size was mildly dilated. Pericardium: There is no evidence of pericardial effusion. Mitral Valve: The mitral valve is degenerative in appearance. Moderate to severe  mitral annular calcification. Tricuspid Valve: Tricuspid valve regurgitation is not demonstrated. Aortic Valve: The aortic valve is tricuspid. There is moderate calcification of the aortic valve. There is moderate thickening of the aortic valve. Aortic valve regurgitation is not visualized. Aortic valve mean gradient measures 6.0 mmHg. Aortic valve peak gradient  measures 10.4 mmHg. Aortic valve area, by VTI measures 1.70 cm. Aorta: The aortic root is normal in size and structure. LEFT VENTRICLE PLAX 2D LVOT diam:     1.86 cm LV SV:         58 LV SV Index:   30 LVOT Area:     2.72 cm  AORTIC VALVE AV Area (Vmax):    1.42 cm AV Area (Vmean):   1.53 cm AV Area (VTI):     1.70 cm AV Vmax:           161.00 cm/s AV Vmean:          115.000 cm/s AV VTI:            0.342 m AV Peak Grad:      10.4 mmHg AV Mean Grad:      6.0 mmHg LVOT Vmax:         84.20 cm/s LVOT Vmean:        64.800 cm/s LVOT VTI:          0.214 m LVOT/AV VTI ratio: 0.63  SHUNTS Systemic VTI:  0.21 m Systemic Diam: 1.86 cm Dani Gobble Croitoru MD Electronically signed by Sanda Klein MD Signature Date/Time: 03/23/2021/6:01:38 PM    Final    Structural Heart Procedure  Result Date: 03/23/2021 23 mm Edwards Sapien 3 Ultra valve implanted from the right femoral approach See full operative note in progress notes section   Disposition   Pt is being discharged home today in good condition.  Follow-up Plans & Appointments    Follow-up Information     Eileen Stanford, PA-C. Go on 03/31/2021.   Specialties: Cardiology, Radiology Why: at 4pm. Please arrive to your appointment at 3:45pm. Contact information: [place] [address] [phone]                Discharge Instructions     Call MD for:  difficulty breathing, headache or visual disturbances   Complete by: As directed    Call MD for:  difficulty breathing, headache or visual disturbances   Complete by: As directed    Call MD for:  extreme fatigue   Complete by: As directed    Call MD for:  extreme fatigue   Complete by: As directed    Call MD for:  hives   Complete by: As directed    Call MD for:  hives   Complete by: As directed    Call MD for:  persistant dizziness or light-headedness   Complete by: As directed    Call MD for:  persistant dizziness or light-headedness   Complete by: As  directed    Call MD for:  persistant nausea and vomiting   Complete by: As directed    Call MD for:  persistant nausea and vomiting   Complete by: As directed    Call MD for:  redness, tenderness, or signs of infection (pain, swelling, redness, odor or green/yellow discharge around incision site)   Complete by: As directed    Call MD for:  redness, tenderness, or signs of infection (pain, swelling, redness, odor or green/yellow discharge around incision site)   Complete by: As  directed    Call MD for:  severe uncontrolled pain   Complete by: As directed    Call MD for:  severe uncontrolled pain   Complete by: As directed    Call MD for:  temperature >100.4   Complete by: As directed    Call MD for:  temperature >100.4   Complete by: As directed    Diet - low sodium heart healthy   Complete by: As directed    Diet - low sodium heart healthy   Complete by: As directed    Discharge instructions   Complete by: As directed    ACTIVITY AND EXERCISE  Daily activity and exercise are an important part of your recovery. People recover at different rates depending on their general health and type of valve procedure.  Most people recovering from TAVR feel better relatively quickly   No lifting, pushing, pulling more than 10 pounds (examples to avoid: groceries, vacuuming, gardening, golfing):             - For one week with a procedure through the groin.             - For six weeks for procedures through the chest wall or neck. NOTE: You will typically see one of our providers 7-14 days after your procedure to discuss [place] the above activities.      DRIVING  Do not drive until you are seen for follow up and cleared by a provider. Generally, we ask patient to not drive for 1 week after their procedure.  If you have been told by your doctor in the past that you may not drive, you must talk with him/her before you begin driving again.   DRESSING  Groin site: you may leave the  clear dressing over the site for up to one week or until it falls off.   HYGIENE  If you had a femoral (leg) procedure, you may take a shower when you return home. After the shower, pat the site dry. Do NOT use powder, oils or lotions in your groin area until the site has completely healed.  If you had a chest procedure, you may shower when you return home unless specifically instructed not to by your discharging practitioner.             - DO NOT scrub incision; pat dry with a towel.             - DO NOT apply any lotions, oils, powders to the incision.             - No tub baths / swimming for at least 2 weeks.  If you notice any fevers, chills, increased pain, swelling, bleeding or pus, please contact your doctor.   ADDITIONAL INFORMATION  If you are going to have an upcoming dental procedure, please contact our office as you will require antibiotics ahead of time to prevent infection on your heart valve.    If you have any questions or concerns you can call the structural heart phone during normal business hours 8am-4pm. If you have an urgent need after hours or weekends please call [phone] to talk to the on call provider for general cardiology. If you have an emergency that requires immediate attention, please call 911.    After TAVR Checklist  Check  Test Description  Follow up appointment in 1-2 weeks  You will see our structural heart advanced practice provider. Your incision sites will be checked and you will  be cleared to drive and resume all normal activities if you are doing well.    1 month echo and follow up  You will have an echo to check on your new heart valve and be seen back in the office by a structural heart advanced practice provider.  Follow up with your primary cardiologist You will need to be seen by your primary cardiologist in the following 3-6 months after your 1 month appointment in the valve clinic. Often times your Plavix or Aspirin will be discontinued  during this time, but this is decided on a case by case basis.   1 year echo and follow up You will have another echo to check on your heart valve after 1 year and be seen back in the office by a structural heart advanced practice provider. This your last structural heart visit.  Bacterial endocarditis prophylaxis  You will have to take antibiotics for the rest of your life before all dental procedures (even teeth cleanings) to protect your heart valve. Antibiotics are also required before some surgeries. Please check with your cardiologist before scheduling any surgeries. Also, please make sure to tell us if you have a penicillin allergy as you will require an alternative antibiotic.   Discharge instructions   Complete by: As directed    ACTIVITY AND EXERCISE  Daily activity and exercise are an important part of your recovery. People recover at different rates depending on their general health and type of valve procedure.  Most people recovering from TAVR feel better relatively quickly   No lifting, pushing, pulling more than 10 pounds (examples to avoid: groceries, vacuuming, gardening, golfing):             - For one week with a procedure through the groin.             - For six weeks for procedures through the chest wall or neck. NOTE: You will typically see one of our providers 7-14 days after your procedure to discuss [place] the above activities.      DRIVING  Do not drive until you are seen for follow up and cleared by a provider. Generally, we ask patient to not drive for 1 week after their procedure.  If you have been told by your doctor in the past that you may not drive, you must talk with him/her before you begin driving again.   DRESSING  Groin site: you may leave the clear dressing over the site for up to one week or until it falls off.   HYGIENE  If you had a femoral (leg) procedure, you may take a shower when you return home. After the shower, pat the site dry. Do  NOT use powder, oils or lotions in your groin area until the site has completely healed.  If you had a chest procedure, you may shower when you return home unless specifically instructed not to by your discharging practitioner.             - DO NOT scrub incision; pat dry with a towel.             - DO NOT apply any lotions, oils, powders to the incision.             - No tub baths / swimming for at least 2 weeks.  If you notice any fevers, chills, increased pain, swelling, bleeding or pus, please contact your doctor.   ADDITIONAL INFORMATION  If you are going to have  an upcoming dental procedure, please contact our office as you will require antibiotics ahead of time to prevent infection on your heart valve.    If you have any questions or concerns you can call the structural heart phone during normal business hours 8am-4pm. If you have an urgent need after hours or weekends please call [phone] to talk to the on call provider for general cardiology. If you have an emergency that requires immediate attention, please call 911.    After TAVR Checklist  Check  Test Description  Follow up appointment in 1-2 weeks  You will see our structural heart advanced practice provider. Your incision sites will be checked and you will be cleared to drive and resume all normal activities if you are doing well.    1 month echo and follow up  You will have an echo to check on your new heart valve and be seen back in the office by a structural heart advanced practice provider.  Follow up with your primary cardiologist You will need to be seen by your primary cardiologist in the following 3-6 months after your 1 month appointment in the valve clinic. Often times your Plavix or Aspirin will be discontinued during this time, but this is decided on a case by case basis.   1 year echo and follow up You will have another echo to check on your heart valve after 1 year and be seen back in the office by a structural  heart advanced practice provider. This your last structural heart visit.  Bacterial endocarditis prophylaxis  You will have to take antibiotics for the rest of your life before all dental procedures (even teeth cleanings) to protect your heart valve. Antibiotics are also required before some surgeries. Please check with your cardiologist before scheduling any surgeries. Also, please make sure to tell us if you have a penicillin allergy as you will require an alternative antibiotic.   Increase activity slowly   Complete by: As directed    Increase activity slowly   Complete by: As directed       Discharge Medications   Allergies as of 03/24/2021       Reactions   Senokot Wheat Bran [wheat Bran]    ABDOMINAL CRAMPS   Spironolactone    Stomach problems, vision changes         Medication List     TAKE these medications    Accu-Chek FastClix Lancets Misc 1 each by Other route 2 (two) times daily.   Accu-Chek Guide test strip Generic drug: glucose blood USE TO CHECK BLOOD SUGAR TWICE DAILY   Accu-Chek Guide w/Device Kit 1 each by Does not apply route 4 (four) times daily.   acetaminophen 500 MG tablet Commonly known as: TYLENOL Take 500-1,000 mg by mouth every 6 (six) hours as needed (pain).   Alphagan P 0.1 % Soln Generic drug: brimonidine Place 1 drop into both eyes 2 (two) times daily.   amLODipine 10 MG tablet Commonly known as: NORVASC TAKE 1 TABLET(10 MG) BY MOUTH EVERY MORNING   aspirin EC 81 MG tablet Take 81 mg by mouth every other day. In the morning. Swallow whole.   B-D ULTRAFINE III SHORT PEN 31G X 8 MM Misc Generic drug: Insulin Pen Needle USE AS DIRECTED TWICE DAILY WITH INSULIN PENS   benazepril 40 MG tablet Commonly known as: LOTENSIN TAKE 1 TABLET(40 MG) BY MOUTH DAILY   cholecalciferol 25 MCG (1000 UNIT) tablet Commonly known as: VITAMIN D3 Take 1,000 Units by  mouth in the morning.   clotrimazole-betamethasone cream Commonly known as:  LOTRISONE Apply twice daily for 1 week, to affected area , then as needed What changed:  how much to take how to take this when to take this reasons to take this additional instructions   ezetimibe 10 MG tablet Commonly known as: ZETIA Take 1 tablet (10 mg total) by mouth daily.   Fish Oil 1200 MG Caps Take 1,200 mg by mouth every morning.   glipiZIDE 5 MG 24 hr tablet Commonly known as: GLUCOTROL XL Take 1 tablet (5 mg total) by mouth daily with breakfast.   insulin isophane & regular human KwikPen (70-30) 100 UNIT/ML KwikPen Commonly known as: HUMULIN 70/30 MIX Inject 60 units with breakfast and 40 units with supper only if glucose is above 90   latanoprost 0.005 % ophthalmic solution Commonly known as: XALATAN Place 1 drop into both eyes at bedtime.   linaclotide 290 MCG Caps capsule Commonly known as: Linzess Take 1 capsule (290 mcg total) by mouth daily before breakfast.   metFORMIN 500 MG tablet Commonly known as: GLUCOPHAGE TAKE 1 TABLET BY MOUTH EVERY DAY WITH BREAKFAST Notes to patient: Restart 03/25/21.    multivitamin with minerals Tabs tablet Take 1 tablet by mouth daily.   pantoprazole 40 MG tablet Commonly known as: PROTONIX Take 1 tablet (40 mg total) by mouth daily. 30 minutes before meal   polyethylene glycol 17 g packet Commonly known as: MiraLax Take 17 g by mouth at bedtime. What changed:  when to take this additional instructions   pravastatin 80 MG tablet Commonly known as: PRAVACHOL Take 1 tablet (80 mg total) by mouth in the morning.   triamterene-hydrochlorothiazide 75-50 MG tablet Commonly known as: MAXZIDE Take 0.5 tablets by mouth daily.   vitamin C 1000 MG tablet Take 1,000 mg by mouth 4 (four) times a week. AT NIGHT         Outstanding Labs/Studies   None   Duration of Discharge Encounter   Greater than 30 minutes including physician time.  Signed, Kathyrn Drown, NP 03/24/2021, 1:27 PM [phone]   I have  personally seen and examined this patient. I agree with the assessment and plan as outlined above.  She is doing well post TAVR. BP stable. Tele reviewed and she is in sinus. Labs reviewed.  Groins stable. D/c home today.   Lauree Chandler 03/24/2021 4:20 PM

## 2021-03-25 ENCOUNTER — Telehealth: Payer: Self-pay | Admitting: Physician Assistant

## 2021-03-25 NOTE — Telephone Encounter (Signed)
  [facility] VALVE TEAM   Patient contacted regarding discharge from [facility] on 11/9  Patient understands to follow up with provider Nell Range on 11/16 at [facility].  Patient understands discharge instructions? yes Patient understands medications and regimen? yes Patient understands to bring all medications to this visit? yes  Angelena Form PA-C  [facility]

## 2021-03-26 LAB — POCT I-STAT, CHEM 8
BUN: 17 mg/dL (ref 8–23)
BUN: 17 mg/dL (ref 8–23)
Calcium, Ion: 1.31 mmol/L (ref 1.15–1.40)
Calcium, Ion: 1.26 mmol/L (ref 1.15–1.40)
Chloride: 101 mmol/L (ref 98–111)
Chloride: 100 mmol/L (ref 98–111)
Creatinine, Ser: 1 mg/dL (ref 0.44–1.00)
Creatinine, Ser: 1 mg/dL (ref 0.44–1.00)
Glucose, Bld: 110 mg/dL — ABNORMAL HIGH (ref 70–99)
Glucose, Bld: 131 mg/dL — ABNORMAL HIGH (ref 70–99)
HCT: 34 % — ABNORMAL LOW (ref 36.0–46.0)
HCT: 32 % — ABNORMAL LOW (ref 36.0–46.0)
Hemoglobin: 11.6 g/dL — ABNORMAL LOW (ref 12.0–15.0)
Hemoglobin: 10.9 g/dL — ABNORMAL LOW (ref 12.0–15.0)
Potassium: 3.2 mmol/L — ABNORMAL LOW (ref 3.5–5.1)
Potassium: 3.2 mmol/L — ABNORMAL LOW (ref 3.5–5.1)
Sodium: 139 mmol/L (ref 135–145)
Sodium: 139 mmol/L (ref 135–145)
TCO2: 26 mmol/L (ref 22–32)
TCO2: 28 mmol/L (ref 22–32)

## 2021-03-26 LAB — POCT ACTIVATED CLOTTING TIME: Activated Clotting Time: 116 seconds

## 2021-03-30 NOTE — Progress Notes (Signed)
[facility]                                     Cardiology Office Note:    Date:  03/31/2021   ID:  Joanna Brown, DOB [birth year], MRN [id number]  PCP:  Fayrene Helper, MD  [facility] Cardiologist:  [name], MD / Dr. Angelena Form, MD & Dr. Cyndia Bent, MD (TAVR) [facility] Electrophysiologist:  None   Referring MD: Fayrene Helper, MD   [facility] s/p TAVR  History of Present Illness:    Joanna Brown is a 80 y.o. female with a hx of DM2, GERD, mild CAD, HLD, HTN and aortic stenosis s/p TAVR (03/23/21) who presents to clinic for follow up.   She was referred to Dr. Angelena Form 01/20/21 for the evaluation of aortic stenosis and possible TAVR. Echocardiogram performed 05/2020 showed an LVEF at 65-70%, mild LVH, trivial MR. The aortic valve leaflets were thickened and calcified with limited leaflet excursion. Mean gradient at 32.5 mmHg, peak gradient 51.7 mmHg, AVA 0.77 cm2, dimensionless index 0.30, SVI 33, consistent with moderately severe aortic stenosis with possible paradoxical low flow/low gradient aortic stenosis. She was referred for further workup and underwent a cardiac catheterization 09/2020 which showed mild CAD, normal cardiac output, normal right and left heart pressures. Mean gradient across the aortic valve 26.6 mmHg by cath. Repeat echo on 01/20/21 showed an  LVEF at 55-60%, moderate to severe aortic stenosis with mean gradient 25 mmHg, peak gradient 43.6 mmHg, AVA 0.83 cm2, dimensionless index 0.26. SVI 30. At the time of evaluation, she was having fatigue and DOE on exertion.  She was evaluated by the multidisciplinary valve team and underwent successful TAVR with a 23 mm Edwards Sapien 3 THV via the TF approach on 03/23/21. Post operative echo showed EF 60%, normally functioning TAVR with a mean gradient of 17 mmHg and no PVL as well as severe MAC with mild MS. She was discharged on aspirn monotherapy.   Today the  patient presents to clinic for follow up.  Here with her husband.  She is doing well.  She had a little bit of achiness in her right leg last night.  She has no chest pain, shortness of breath, orthopnea or PND.  She has no lower extremity edema.  She has occasional dizziness but no syncope.  No palpitations.  She overall feels like her stamina is improved since TAVR.  She does have occasional fatigue.    Past Medical History:  Diagnosis Date   Anginal pain ([place])    Arthritis    OA   Diabetes mellitus    Dyspnea    Female bladder prolapse    GERD (gastroesophageal reflux disease)    Glaucoma    Hyperlipidemia    Hypertension    echo and stress 4/10 reports on chart, EKG ` LOV 9/12 on chart   S/P TAVR (transcatheter aortic valve replacement) 03/23/2021   Edwards 58m S3U TF approach with Dr. MAngelena Formand Dr. BCyndia Bent  Severe aortic stenosis    Thyroid disease     Past Surgical History:  Procedure Laterality Date   ABDOMINAL HYSTERECTOMY     ANTERIOR AND POSTERIOR REPAIR  04/26/2011   Procedure: ANTERIOR (CYSTOCELE) AND POSTERIOR REPAIR (RECTOCELE);  Surgeon: SReece Packer MD;  Location: [facility];  Service: Urology;  Laterality: N/A;   BIOPSY  05/25/2020   Procedure: BIOPSY;  Surgeon: Doran Stabler, MD;  Location: [facility];  Service: Gastroenterology;;   CATARACT EXTRACTION Bilateral    with IOL   CHOLECYSTECTOMY  2009   COLONOSCOPY N/A 12/02/2013   three colon polyps removed, small internal hemorrhoids. Hyperplastic polyps   COLONOSCOPY N/A 11/20/2019   pancolonic diverticulosis, two 10-11 mm polyps in ascending colon, one 5 mm polyp in cecum, ascending colon with superficially invasive adenocarcinoma arising in background of sessile serrated polyps with low and high grade cytologic dysplasia.   COLONOSCOPY     COLONOSCOPY W/ POLYPECTOMY     COLONOSCOPY WITH PROPOFOL N/A 05/25/2020   diverticulosis in right colon, redundant colon. Caution warranted on future  colonoscopy in light of age, cardiac condition, challenging anatomy.    DILATION AND CURETTAGE OF UTERUS     pt states this was in the 1970's   ESOPHAGOGASTRODUODENOSCOPY (EGD) WITH PROPOFOL N/A 05/25/2020   Grade 1 esophageal varices, single mucosal nodule in stomach s/p biopsy. (hyperplastic)>    LEFT HEART CATH  09/10/2008   normal coronary arteries, normal LV systolic function, EF 70% (Dr. [name])   [place] Right 1997   under arm   NM MYOCAR PERF WALL MOTION  2010   dipyridamole - mild-mod in intenstiy perfusion defect in mid anterior, mid anteroseptal wall, EF 70%   OVARY SURGERY     bilateral tumors removed   POLYPECTOMY  11/20/2019   Procedure: POLYPECTOMY;  Surgeon: Daneil Dolin, MD;  [facility];  Service: Endoscopy;;  hot and cold snare cecal polyp, and asending polyps x 2   RIGHT/LEFT HEART CATH AND CORONARY ANGIOGRAPHY N/A 10/02/2020   Procedure: RIGHT/LEFT HEART CATH AND CORONARY ANGIOGRAPHY;  Surgeon: Martinique, Peter M, MD;  Location: [facility];  Service: Cardiovascular;  Laterality: N/A;   THYROIDECTOMY     THYROIDECTOMY  02/2008   TRANSCATHETER AORTIC VALVE REPLACEMENT, TRANSFEMORAL N/A 03/23/2021   Procedure: TRANSCATHETER AORTIC VALVE REPLACEMENT, TRANSFEMORAL;  Surgeon: Burnell Blanks, MD;  Location: [facility];  Service: Open Heart Surgery;  Laterality: N/A;   TRANSTHORACIC ECHOCARDIOGRAM  08/2011   EF=>55%, mild conc LVH; trace MR; mild TR; mild-mod AV calcification with mild valvular AV stenosis   VAGINAL PROLAPSE REPAIR  04/26/2011   Procedure: VAGINAL VAULT SUSPENSION;  Surgeon: Reece Packer, MD;  Location: [facility];  Service: Urology;  Laterality: N/A;  with Graft  10x6    Current Medications: Current Meds  Medication Sig   Accu-Chek FastClix Lancets MISC 1 each by Other route 2 (two) times daily.   acetaminophen (TYLENOL) 500 MG tablet Take 500-1,000 mg by mouth every 6 (six) hours as needed (pain).    ALPHAGAN P 0.1 % SOLN Place 1 drop into both eyes 2 (two) times daily.   amLODipine (NORVASC) 10 MG tablet TAKE 1 TABLET(10 MG) BY MOUTH EVERY MORNING   amoxicillin (AMOXIL) 500 MG capsule Take 4 capsules (2,000 mg total) by mouth as needed (1 hour prior to dental work).   Ascorbic Acid (VITAMIN C) 1000 MG tablet Take 1,000 mg by mouth 4 (four) times a week. AT NIGHT   aspirin EC 81 MG tablet Take 81 mg by mouth every other day. In the morning. Swallow whole.   benazepril (LOTENSIN) 40 MG tablet TAKE 1 TABLET(40 MG) BY MOUTH DAILY   Blood Glucose Monitoring Suppl (ACCU-CHEK GUIDE) w/Device KIT 1 each by Does not apply route 4 (four) times daily.   cholecalciferol (  VITAMIN D3) 25 MCG (1000 UT) tablet Take 1,000 Units by mouth in the morning.   clotrimazole-betamethasone (LOTRISONE) cream Apply twice daily for 1 week, to affected area , then as needed (Patient taking differently: Apply 1 application topically 2 (two) times daily as needed (itching/skin irritation).)   ezetimibe (ZETIA) 10 MG tablet Take 1 tablet (10 mg total) by mouth daily.   glipiZIDE (GLUCOTROL XL) 5 MG 24 hr tablet Take 1 tablet (5 mg total) by mouth daily with breakfast.   glucose blood (ACCU-CHEK GUIDE) test strip USE TO CHECK BLOOD SUGAR TWICE DAILY   insulin isophane & regular human KwikPen (HUMULIN 70/30 MIX) (70-30) 100 UNIT/ML KwikPen Inject 60 units with breakfast and 40 units with supper only if glucose is above 90   Insulin Pen Needle (B-D ULTRAFINE III SHORT PEN) 31G X 8 MM MISC USE AS DIRECTED TWICE DAILY WITH INSULIN PENS   latanoprost (XALATAN) 0.005 % ophthalmic solution Place 1 drop into both eyes at bedtime.    linaclotide (LINZESS) 290 MCG CAPS capsule Take 1 capsule (290 mcg total) by mouth daily before breakfast.   metFORMIN (GLUCOPHAGE) 500 MG tablet TAKE 1 TABLET BY MOUTH EVERY DAY WITH BREAKFAST   Multiple Vitamin (MULTIVITAMIN WITH MINERALS) TABS tablet Take 1 tablet by mouth daily.   Omega-3 Fatty  Acids (FISH OIL) 1200 MG CAPS Take 1,200 mg by mouth every morning.   pantoprazole (PROTONIX) 40 MG tablet Take 1 tablet (40 mg total) by mouth daily. 30 minutes before meal   polyethylene glycol (MIRALAX) 17 g packet Take 17 g by mouth at bedtime. (Patient taking differently: Take 17 g by mouth daily. IN THE MORNING)   pravastatin (PRAVACHOL) 80 MG tablet Take 1 tablet (80 mg total) by mouth in the morning.   triamterene-hydrochlorothiazide (MAXZIDE) 75-50 MG tablet Take 0.5 tablets by mouth daily.     Allergies:   Senokot wheat bran [wheat bran] and Spironolactone   Social History   Socioeconomic History   Marital status: Married    Spouse name: [name]   Number of children: 1   Years of education: Trade   Highest education level: 12th grade  Occupational History   Occupation: Retired   Occupation: Writer  Tobacco Use   Smoking status: Never   Smokeless tobacco: Never  Scientific laboratory technician Use: Never used  Substance and Sexual Activity   Alcohol use: Not Currently   Drug use: No   Sexual activity: Yes  Other Topics Concern   Not on file  Social History Narrative   Patient lives at home with spouse. 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 LIVED IN [place] FOR 50 YRS AND CAME BACK TO [place] ~2009. Caffeine Use: 1 cup of coffee daily. HAD ONE CHILD: PASSED 5 YRS. HAVE THREE GRAND-KIDS AND TWO GREAT GRANDS.    Social Determinants of Health   Financial Resource Strain: Low Risk    Difficulty of Paying Living Expenses: Not hard at all  Food Insecurity: No Food Insecurity   Worried About Charity fundraiser in the Last Year: Never true   [place] in the Last Year: Never true  Transportation Needs: No Transportation Needs   Lack of Transportation (Medical): No   Lack of Transportation (Non-Medical): No  Physical Activity: Sufficiently Active   Days of Exercise per Week: 5 days   Minutes of Exercise per Session: 30 min  Stress: No Stress  Concern Present   Feeling of Stress :  Not at all  Social Connections: Moderately Integrated   Frequency of Communication with Friends and Family: More than three times a week   Frequency of Social Gatherings with Friends and Family: Twice a week   Attends Religious Services: More than 4 times per year   Active Member of [employer] or Organizations: No   Attends Music therapist: Never   Marital Status: Married     Family History: The patient's family history includes Bone cancer in her brother; Heart attack in her paternal grandfather; Heart disease (age of onset: 32) in her father; Heart disease (age of onset: 82) in her mother; Hypertension in her brother, child, sister, and sister; Liver cancer in her sister; Stomach cancer (age of onset: 11) in her mother; Stroke in her maternal grandfather. There is no history of Colon cancer, Colon polyps, Pancreatic cancer, Esophageal cancer, or Rectal cancer.  ROS:   Please see the history of present illness.    All other systems reviewed and are negative.  EKGs/Labs/Other Studies Reviewed:    The following studies were reviewed today:  TAVR OPERATIVE NOTE     Date of Procedure:                03/23/2021   Preoperative Diagnosis:      Severe Aortic Stenosis    Postoperative Diagnosis:    Same    Procedure:        Transcatheter Aortic Valve Replacement - Percutaneous Right Transfemoral Approach             Edwards Sapien 3 Ultra RSL THV (size 23 mm, model # 3567OL:, serial # [id number])              Co-Surgeons:                        Gaye Pollack, MD and  Lauree Chandler, MD     Anesthesiologist:                  Maryclare Bean, DO   Echocardiographer:              Bertrum Sol, MD   Pre-operative Echo Findings: Severe aortic stenosis Normal left ventricular systolic function   Post-operative Echo Findings: No paravalvular leak Normal left ventricular systolic function _____________   Echo 03/24/21: IMPRESSIONS    1. Left ventricular ejection fraction, by estimation, is 60 to 65%. The  left ventricle has normal function. The left ventricle has no regional  wall motion abnormalities. There is mild concentric left ventricular  hypertrophy. Left ventricular diastolic  parameters are consistent with Grade I diastolic dysfunction (impaired  relaxation). Elevated left atrial pressure.   2. Right ventricular systolic function is normal. The right ventricular  size is normal.   3. Left atrial size was mild to moderately dilated.   4. The mitral valve is normal in structure. Trivial mitral valve  regurgitation. Borderline mitral stenosis. The mean mitral valve gradient  is 4.5 mmHg. Moderate to severe mitral annular calcification.   5. The aortic valve has been repaired/replaced. Aortic valve  regurgitation is not visualized. No aortic stenosis is present. There is a  23 mm Edwards Sapien prosthetic (TAVR) valve present in the aortic  position. Procedure Date: 03/23/2021. Aortic valve   mean gradient measures 17.0 mmHg. Aortic valve Vmax measures 2.66 m/s.  Aortic valve acceleration time measures 67 msec.   6. The inferior vena cava is normal in size with greater than 50%  respiratory variability, suggesting right atrial pressure of 3 mmHg.   EKG:  EKG is ordered today.  The ekg ordered today demonstrates sinus with first-degree AV block (PR 248 ms ) heart rate 71  Recent Labs: 01/06/2021: TSH 2.950 03/22/2021: ALT 22; B Natriuretic Peptide 16.2 03/24/2021: BUN 17; Creatinine, Ser 1.09; Hemoglobin 11.6; Platelets 139; Potassium 3.8; Sodium 135  Recent Lipid Panel    Component Value Date/Time   CHOL 161 01/06/2021 0802   TRIG 167 (H) 01/06/2021 0802   HDL 38 (L) 01/06/2021 0802   CHOLHDL 4.2 01/06/2021 0802   CHOLHDL 4.7 07/30/2019 0718   VLDL 39 12/05/2017 0832   LDLCALC 94 01/06/2021 0802   LDLCALC 99 07/30/2019 0718     Risk Assessment/Calculations:       Physical Exam:    VS:  BP 126/78  (BP Location: Left Arm, Patient Position: Sitting, Cuff Size: Normal)   Pulse 71   Ht _0  (1.651 m)   Wt 180 lb (81.6 kg)   BMI 29.95 kg/m     Wt Readings from Last 3 Encounters:  03/31/21 180 lb (81.6 kg)  03/24/21 182 lb 11.2 oz (82.9 kg)  03/22/21 187 lb 8 oz (85 kg)     GEN:  Well nourished, well developed in no acute distress HEENT: Normal NECK: No JVD; No carotid bruits LYMPHATICS: No lymphadenopathy CARDIAC: RRR, no murmurs, rubs, gallops RESPIRATORY:  Clear to auscultation without rales, wheezing or rhonchi  ABDOMEN: Soft, non-tender, non-distended MUSCULOSKELETAL:  No edema; No deformity  SKIN: Warm and dry.  Groin sites clear without hematoma or ecchymosis  NEUROLOGIC:  Alert and oriented x 3 PSYCHIATRIC:  Normal affect   ASSESSMENT:    1. S/P TAVR (transcatheter aortic valve replacement)   2. Essential hypertension   3. Gastroesophageal reflux disease without esophagitis    PLAN:    In order of problems listed above:  Severe AS s/p TAVR: Doing great 1 week out from TAVR.  Groin sites healing well.  ECG with no HAVB.  Continue on aspirin alone. SBE prophylaxis discussed; I have RX'd amoxicillin.  We will see her back in structural heart clinic in a few weeks for follow-up and echocardiogram  HTN: BP well controlled. No changes made.   GERD: continue PPI   Medication Adjustments/Labs and Tests Ordered: Current medicines are reviewed at length with the patient today.  Concerns regarding medicines are outlined above.  Orders Placed This Encounter  Procedures   EKG 12-Lead    Meds ordered this encounter  Medications   amoxicillin (AMOXIL) 500 MG capsule    Sig: Take 4 capsules (2,000 mg total) by mouth as needed (1 hour prior to dental work).    Dispense:  12 capsule    Refill:  2     Patient Instructions  Medication Instructions:  Your physician has recommended you make the following change in your medication:  START: take amoxicillin 2,000 mg by  mouth 1 hour prior to all dental work  *If you need a refill on your cardiac medications before your next appointment, please call your pharmacy*   Lab Work: NONE If you have labs (blood work) drawn today and your tests are completely normal, you will receive your results only by: [place] (if you have MyChart) OR A paper copy in the mail If you have any lab test that is abnormal or we need to change your treatment, we will call you to review the results.   Testing/Procedures: NONE   Follow-Up:  AS scheduled  At [facility], you and your health needs are our priority.  As part of our continuing mission to provide you with exceptional heart care, we have created designated Provider Care Teams.  These Care Teams include your primary Cardiologist (physician) and Advanced Practice Providers (APPs -  Physician Assistants and Nurse Practitioners) who all work together to provide you with the care you need, when you need it.     Signed, Angelena Form, PA-C  03/31/2021 4:17 PM    [facility]

## 2021-03-31 ENCOUNTER — Ambulatory Visit (INDEPENDENT_AMBULATORY_CARE_PROVIDER_SITE_OTHER): Payer: Medicare Other | Admitting: Physician Assistant

## 2021-03-31 ENCOUNTER — Other Ambulatory Visit: Payer: Self-pay

## 2021-03-31 VITALS — BP 126/78 | HR 71 | Ht 65.0 in | Wt 180.0 lb

## 2021-03-31 DIAGNOSIS — K219 Gastro-esophageal reflux disease without esophagitis: Secondary | ICD-10-CM

## 2021-03-31 DIAGNOSIS — Z952 Presence of prosthetic heart valve: Secondary | ICD-10-CM

## 2021-03-31 DIAGNOSIS — I10 Essential (primary) hypertension: Secondary | ICD-10-CM | POA: Diagnosis not present

## 2021-03-31 MED ORDER — AMOXICILLIN 500 MG PO CAPS: 2000.0000 mg | ORAL_CAPSULE | ORAL | 2 refills | Status: DC | PRN

## 2021-03-31 NOTE — Patient Instructions (Signed)
Medication Instructions:  Your physician has recommended you make the following change in your medication:  START: take amoxicillin 2,000 mg by mouth 1 hour prior to all dental work  *If you need a refill on your cardiac medications before your next appointment, please call your pharmacy*   Lab Work: NONE If you have labs (blood work) drawn today and your tests are completely normal, you will receive your results only by: [place] (if you have MyChart) OR A paper copy in the mail If you have any lab test that is abnormal or we need to change your treatment, we will call you to review the results.   Testing/Procedures: NONE   Follow-Up: AS scheduled  At [facility], you and your health needs are our priority.  As part of our continuing mission to provide you with exceptional heart care, we have created designated Provider Care Teams.  These Care Teams include your primary Cardiologist (physician) and Advanced Practice Providers (APPs -  Physician Assistants and Nurse Practitioners) who all work together to provide you with the care you need, when you need it.

## 2021-04-11 ENCOUNTER — Other Ambulatory Visit: Payer: Self-pay | Admitting: Gastroenterology

## 2021-04-20 ENCOUNTER — Other Ambulatory Visit: Payer: Self-pay | Admitting: Family Medicine

## 2021-04-21 ENCOUNTER — Other Ambulatory Visit: Payer: Self-pay | Admitting: "Endocrinology

## 2021-04-21 ENCOUNTER — Ambulatory Visit: Payer: Medicare Other | Admitting: Physician Assistant

## 2021-04-22 ENCOUNTER — Other Ambulatory Visit (HOSPITAL_COMMUNITY): Payer: Medicare Other

## 2021-04-23 NOTE — Progress Notes (Signed)
[facility] [name]                                     Cardiology Office Note:    Date:  04/27/2021   ID:  Joanna Brown, DOB 02-20-1941, MRN [id number]  PCP:  Fayrene Helper, MD  [facility] Cardiologist:  [name], MD  [facility] Electrophysiologist:  None   Referring MD: Fayrene Helper, MD   Chief Complaint  Patient presents with   Follow-up    1 month s/p TAVR    History of Present Illness:    Joanna Brown is a 80 y.o. female with a hx of  DM2, GERD, mild CAD, HLD, HTN and aortic stenosis s/p TAVR (03/23/21) who presents to clinic for 1 month TAVR follow up.    She was referred to Dr. Angelena Brown 01/20/21 for the evaluation of aortic stenosis and possible TAVR. Echocardiogram performed 05/2020 showed an LVEF at 65-70%, mild LVH, trivial MR. The aortic valve leaflets were thickened and calcified with limited leaflet excursion. Mean gradient at 32.5 mmHg, peak gradient 51.7 mmHg, AVA 0.77 cm2, dimensionless index 0.30, SVI 33, consistent with moderately severe aortic stenosis with possible paradoxical low flow/low gradient aortic stenosis. She was referred for further workup and underwent a cardiac catheterization 09/2020 which showed mild CAD, normal cardiac output, normal right and left heart pressures. Mean gradient across the aortic valve 26.6 mmHg by cath. Repeat echo on 01/20/21 showed an  LVEF at 55-60%, moderate to severe aortic stenosis with mean gradient 25 mmHg, peak gradient 43.6 mmHg, AVA 0.83 cm2, dimensionless index 0.26. SVI 30. At the time of evaluation, she was having fatigue and DOE on exertion.   She was evaluated by the multidisciplinary valve team and underwent successful TAVR with a 23 mm Edwards Sapien 3 THV via the TF approach on 03/23/21. Post operative echo showed EF 60%, normally functioning TAVR with a mean gradient of 17 mmHg and no PVL as well as severe MAC with mild MS. Shre was discharged on  aspirn monotheapy.   Today she presents with her husband and reports that overall she has been doing well although she did have an episode of chest discomfort yesterday and today however thinks this is related to her reflux. She has had no exertional pain or diaphoresis. Pre TAVR cath with minimal CAD and no obstructive disease. She denies SOB, LE edema, orthopnea, dizziness, or syncope. SBE discussed and reviewed. Tolerating ASA without issues however did report missing two doses. Reiterated the importance of ASA therapy. She is back to doing house work and walking. Reports these activities have been much easier since her surgery. Continues to report mild fatigue. Echo today with no PVL and mean gradient at 10mHg.   Past Medical History:  Diagnosis Date   Anginal pain ([facility]    Arthritis    OA   Diabetes mellitus    Dyspnea    Female bladder prolapse    GERD (gastroesophageal reflux disease)    Glaucoma    Hyperlipidemia    Hypertension    echo and stress 4/10 reports on chart, EKG ` LOV 9/12 on chart   S/P TAVR (transcatheter aortic valve replacement) 03/23/2021   Edwards 246mS3U TF approach with Dr. McAngelena Formnd Dr. BaCyndia Brown Severe aortic stenosis    Thyroid disease     Past  Surgical History:  Procedure Laterality Date   ABDOMINAL HYSTERECTOMY     ANTERIOR AND POSTERIOR REPAIR  04/26/2011   Procedure: ANTERIOR (CYSTOCELE) AND POSTERIOR REPAIR (RECTOCELE);  Surgeon: Joanna Packer, MD;  Location: [facility];  Service: Urology;  Laterality: N/A;   BIOPSY  05/25/2020   Procedure: BIOPSY;  Surgeon: Joanna Stabler, MD;  Location: [facility];  Service: Gastroenterology;;   CATARACT EXTRACTION Bilateral    with IOL   CHOLECYSTECTOMY  2009   COLONOSCOPY N/A 12/02/2013   three colon polyps removed, small internal hemorrhoids. Hyperplastic polyps   COLONOSCOPY N/A 11/20/2019   pancolonic diverticulosis, two 10-11 mm polyps in ascending colon, one 5 mm polyp in cecum, ascending  colon with superficially invasive adenocarcinoma arising in background of sessile serrated polyps with low and high grade cytologic dysplasia.   COLONOSCOPY     COLONOSCOPY W/ POLYPECTOMY     COLONOSCOPY WITH PROPOFOL N/A 05/25/2020   diverticulosis in right colon, redundant colon. Caution warranted on future colonoscopy in light of age, cardiac condition, challenging anatomy.    DILATION AND CURETTAGE OF UTERUS     pt states this was in the 1970's   ESOPHAGOGASTRODUODENOSCOPY (EGD) WITH PROPOFOL N/A 05/25/2020   Grade 1 esophageal varices, single mucosal nodule in stomach s/p biopsy. (hyperplastic)>    LEFT HEART CATH  09/10/2008   normal coronary arteries, normal LV systolic function, EF 50% (Dr. Norlene Brown)   [place] Right 1997   under arm   NM MYOCAR PERF WALL MOTION  2010   dipyridamole - mild-mod in intenstiy perfusion defect in mid anterior, mid anteroseptal wall, EF 70%   OVARY SURGERY     bilateral tumors removed   POLYPECTOMY  11/20/2019   Procedure: POLYPECTOMY;  Surgeon: Daneil Dolin, MD;  [facility];  Service: Endoscopy;;  hot and cold snare cecal polyp, and asending polyps x 2   RIGHT/LEFT HEART CATH AND CORONARY ANGIOGRAPHY N/A 10/02/2020   Procedure: RIGHT/LEFT HEART CATH AND CORONARY ANGIOGRAPHY;  Surgeon: Martinique, Peter M, MD;  Location: [place] [facility];  Service: Cardiovascular;  Laterality: N/A;   THYROIDECTOMY     THYROIDECTOMY  02/2008   TRANSCATHETER AORTIC VALVE REPLACEMENT, TRANSFEMORAL N/A 03/23/2021   Procedure: TRANSCATHETER AORTIC VALVE REPLACEMENT, TRANSFEMORAL;  Surgeon: Joanna Blanks, MD;  Location: [place] [facility];  Service: Open Heart Surgery;  Laterality: N/A;   TRANSTHORACIC ECHOCARDIOGRAM  08/2011   EF=>55%, mild conc LVH; trace MR; mild TR; mild-mod AV calcification with mild valvular AV stenosis   VAGINAL PROLAPSE REPAIR  04/26/2011   Procedure: VAGINAL VAULT SUSPENSION;  Surgeon: Joanna Packer, MD;   Location: [facility];  Service: Urology;  Laterality: N/A;  with Graft  10x6    Current Medications: Current Meds  Medication Sig   Accu-Chek FastClix Lancets MISC 1 each by Other route 2 (two) times daily.   acetaminophen (TYLENOL) 500 MG tablet Take 500-1,000 mg by mouth every 6 (six) hours as needed (pain).   ALPHAGAN P 0.1 % SOLN Place 1 drop into both eyes 2 (two) times daily.   amLODipine (NORVASC) 10 MG tablet TAKE 1 TABLET(10 MG) BY MOUTH EVERY MORNING   amoxicillin (AMOXIL) 500 MG capsule Take 4 capsules (2,000 mg total) by mouth as needed (1 hour prior to dental work).   Ascorbic Acid (VITAMIN C) 1000 MG tablet Take 1,000 mg by mouth 4 (four) times a week. AT NIGHT   aspirin EC 81 MG tablet Take 81  mg by mouth every other day. In the morning. Swallow whole.   benazepril (LOTENSIN) 40 MG tablet TAKE 1 TABLET(40 MG) BY MOUTH DAILY   Blood Glucose Monitoring Suppl (ACCU-CHEK GUIDE) w/Device KIT 1 each by Does not apply route 4 (four) times daily.   cholecalciferol (VITAMIN D3) 25 MCG (1000 UT) tablet Take 1,000 Units by mouth in the morning.   clotrimazole-betamethasone (LOTRISONE) cream Apply twice daily for 1 week, to affected area , then as needed   ezetimibe (ZETIA) 10 MG tablet Take 1 tablet (10 mg total) by mouth daily.   glipiZIDE (GLUCOTROL XL) 5 MG 24 hr tablet TAKE 1 TABLET(5 MG) BY MOUTH DAILY WITH BREAKFAST   glucose blood (ACCU-CHEK GUIDE) test strip USE TO CHECK BLOOD SUGAR TWICE DAILY   insulin isophane & regular human KwikPen (HUMULIN 70/30 MIX) (70-30) 100 UNIT/ML KwikPen Inject 60 units with breakfast and 40 units with supper only if glucose is above 90   Insulin Pen Needle (B-D ULTRAFINE III SHORT PEN) 31G X 8 MM MISC USE AS DIRECTED TWICE DAILY WITH INSULIN PENS   latanoprost (XALATAN) 0.005 % ophthalmic solution Place 1 drop into both eyes at bedtime.    LINZESS 290 MCG CAPS capsule TAKE 1 CAPSULE(290 MCG) BY MOUTH DAILY BEFORE AND BREAKFAST   metFORMIN (GLUCOPHAGE)  500 MG tablet TAKE 1 TABLET BY MOUTH EVERY DAY WITH BREAKFAST   Multiple Vitamin (MULTIVITAMIN WITH MINERALS) TABS tablet Take 1 tablet by mouth daily.   Omega-3 Fatty Acids (FISH OIL) 1200 MG CAPS Take 1,200 mg by mouth every morning.   pantoprazole (PROTONIX) 40 MG tablet Take 1 tablet (40 mg total) by mouth daily. 30 minutes before meal   polyethylene glycol (MIRALAX) 17 g packet Take 17 g by mouth at bedtime.   pravastatin (PRAVACHOL) 80 MG tablet TAKE 1 TABLET(80 MG) BY MOUTH DAILY   triamterene-hydrochlorothiazide (MAXZIDE) 75-50 MG tablet TAKE 1 TABLET BY MOUTH DAILY     Allergies:   Senokot wheat bran [wheat bran] and Spironolactone   Social History   Socioeconomic History   Marital status: Married    Spouse name: [name]   Number of children: 1   Years of education: Trade   Highest education level: 12th grade  Occupational History   Occupation: Retired   Occupation: Writer  Tobacco Use   Smoking status: Never   Smokeless tobacco: Never  Scientific laboratory technician Use: Never used  Substance and Sexual Activity   Alcohol use: Not Currently   Drug use: No   Sexual activity: Yes  Other Topics Concern   Not on file  Social History Narrative   Patient lives at home with spouse. 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 LIVED IN [place] FOR 50 YRS AND CAME BACK TO [place] ~2009. Caffeine Use: 1 cup of coffee daily. HAD ONE CHILD: PASSED 5 YRS. HAVE THREE GRAND-KIDS AND TWO GREAT GRANDS.    Social Determinants of Health   Financial Resource Strain: Low Risk    Difficulty of Paying Living Expenses: Not hard at all  Food Insecurity: No Food Insecurity   Worried About Charity fundraiser in the Last Year: Never true   [place] in the Last Year: Never true  Transportation Needs: No Transportation Needs   Lack of Transportation (Medical): No   Lack of Transportation (Non-Medical): No  Physical Activity: Sufficiently Active   Days of Exercise  per Week: 5 days   Minutes of Exercise per  Session: 30 min  Stress: No Stress Concern Present   Feeling of Stress : Not at all  Social Connections: Moderately Integrated   Frequency of Communication with Friends and Family: More than three times a week   Frequency of Social Gatherings with Friends and Family: Twice a week   Attends Religious Services: More than 4 times per year   Active Member of [employer] or Organizations: No   Attends Music therapist: Never   Marital Status: Married     Family History: The patient's family history includes Bone cancer in her brother; Heart attack in her paternal grandfather; Heart disease (age of onset: 66) in her father; Heart disease (age of onset: 2) in her mother; Hypertension in her brother, child, sister, and sister; Liver cancer in her sister; Stomach cancer (age of onset: 32) in her mother; Stroke in her maternal grandfather. There is no history of Colon cancer, Colon polyps, Pancreatic cancer, Esophageal cancer, or Rectal cancer.  ROS:   Please see the history of present illness.    All other systems reviewed and are negative.  EKGs/Labs/Other Studies Reviewed:    The following studies were reviewed today:  Echocardiogram 04/26/21:   1. Left ventricular ejection fraction, by estimation, is 55 to 60%. The  left ventricle has normal function. The left ventricle has no regional  wall motion abnormalities. There is moderate concentric left ventricular  hypertrophy. Left ventricular  diastolic parameters are consistent with Grade I diastolic dysfunction  (impaired relaxation).   2. Right ventricular systolic function is normal. The right ventricular  size is normal. Tricuspid regurgitation signal is inadequate for assessing  PA pressure.   3. Left atrial size was mildly dilated.   4. The mitral valve is abnormal. Mild mitral valve regurgitation. Mild  mitral stenosis. Severe mitral annular calcification.   5. The aortic valve  has been repaired/replaced. There is a 60m Edwards  Sapien TAVR valve present in the aortic position. Echo findings are  consistent with normal structure and function of the aortic valve  prosthesis. AoV mean gradient 121mg, Vmax  2.51m46m DI 0.35. There is no paravalvular leak.   6. The inferior vena cava is normal in size with greater than 50%  respiratory variability, suggesting right atrial pressure of 3 mmHg.    TAVR OPERATIVE NOTE     Date of Procedure:                03/23/2021   Preoperative Diagnosis:      Severe Aortic Stenosis    Postoperative Diagnosis:    Same    Procedure:        Transcatheter Aortic Valve Replacement - Percutaneous Right Transfemoral Approach             Edwards Sapien 3 Ultra RSL THV (size 23 mm, model # [id number] # [id number]            Co-Surgeons:                        BryGaye PollackD and  ChrLauree ChandlerD     Anesthesiologist:                  G. Maryclare BeanO   Echocardiographer:              Brown. Bertrum SolD   Pre-operative Echo Findings: Severe aortic stenosis Normal left ventricular systolic function   Post-operative Echo Findings: No paravalvular leak Normal  left ventricular systolic function _____________   Echo 03/24/21: IMPRESSIONS   1. Left ventricular ejection fraction, by estimation, is 60 to 65%. The  left ventricle has normal function. The left ventricle has no regional  wall motion abnormalities. There is mild concentric left ventricular  hypertrophy. Left ventricular diastolic  parameters are consistent with Grade I diastolic dysfunction (impaired  relaxation). Elevated left atrial pressure.   2. Right ventricular systolic function is normal. The right ventricular  size is normal.   3. Left atrial size was mild to moderately dilated.   4. The mitral valve is normal in structure. Trivial mitral valve  regurgitation. Borderline mitral stenosis. The mean mitral valve gradient  is 4.5 mmHg. Moderate  to severe mitral annular calcification.   5. The aortic valve has been repaired/replaced. Aortic valve  regurgitation is not visualized. No aortic stenosis is present. There is a  23 mm Edwards Sapien prosthetic (TAVR) valve present in the aortic  position. Procedure Date: 03/23/2021. Aortic valve   mean gradient measures 17.0 mmHg. Aortic valve Vmax measures 2.66 Brown/s.  Aortic valve acceleration time measures 67 msec.   6. The inferior vena cava is normal in size with greater than 50%  respiratory variability, suggesting right atrial pressure of 3 mmHg.   EKG:  EKG is not ordered today.    Recent Labs: 01/06/2021: TSH 2.950 03/22/2021: ALT 22; B Natriuretic Peptide 16.2 03/24/2021: BUN 17; Creatinine, Ser 1.09; Hemoglobin 11.6; Platelets 139; Potassium 3.8; Sodium 135  Recent Lipid Panel    Component Value Date/Time   CHOL 161 01/06/2021 0802   TRIG 167 (H) 01/06/2021 0802   HDL 38 (L) 01/06/2021 0802   CHOLHDL 4.2 01/06/2021 0802   CHOLHDL 4.7 07/30/2019 0718   VLDL 39 12/05/2017 0832   LDLCALC 94 01/06/2021 0802   LDLCALC 99 07/30/2019 0718   Physical Exam:    VS:  BP 134/78   Pulse 71   Ht _0  (1.651 Brown)   Wt 184 lb (83.5 kg)   SpO2 99%   BMI 30.62 kg/Brown     Wt Readings from Last 3 Encounters:  04/26/21 184 lb (83.5 kg)  03/31/21 180 lb (81.6 kg)  03/24/21 182 lb 11.2 oz (82.9 kg)   General: Well developed, well nourished, NAD Neck: Negative for carotid bruits. No JVD Lungs:Clear to ausculation bilaterally. Breathing is unlabored. Cardiovascular: RRR with S1 S2. Very soft murmur Extremities: No edema.  Neuro: Alert and oriented. No focal deficits. No facial asymmetry. MAE spontaneously. Psych: Responds to questions appropriately with normal affect.    ASSESSMENT/PLAN:    Severe AS s/p TAVR: Doing well s/p TAVR with NYHA class II symptoms. Tolerating ASA without issues. SBE prophylaxis with Amoxicillin 2g one hour prior to dental procedures. Echocardiogram today  with no PVL and mean gradient at 8mHg. Plan for 1 year follow with echocardiogram.   Non-exertional chest pain: Two episodes noted to be sharp and brief with no associated symptoms. Non exertional. No CAD on pre TAVR cath. Echo with stable valve placement and gradients. Likely secondary to GERD/reflux. Recommended adding Protonix daily for the next two weeks and monitor symptoms. Can follow with PCP.   HTN: BP well controlled. No changes made.    GERD: See plan as above.    Medication Adjustments/Labs and Tests Ordered: Current medicines are reviewed at length with the patient today.  Concerns regarding medicines are outlined above.  Orders Placed This Encounter  Procedures   ECHOCARDIOGRAM COMPLETE    Patient Instructions  Medication Instructions:  Your physician recommends that you continue on your current medications as directed. Please refer to the Current Medication list given to you today.  *If you need a refill on your cardiac medications before your next appointment, please call your pharmacy*   Lab Work: NONE If you have labs (blood work) drawn today and your tests are completely normal, you will receive your results only by: [place] (if you have MyChart) OR A paper copy in the mail If you have any lab test that is abnormal or we need to change your treatment, we will call you to review the results.   Testing/Procedures: Your physician has requested that you have an echocardiogram. Echocardiography is a painless test that uses sound waves to create images of your heart. It provides your doctor with information about the size and shape of your heart and how well your heart's chambers and valves are working. This procedure takes approximately one hour. There are no restrictions for this procedure. TO BE DONE IN November 2023, SAME DAY AS FOLLOW UP   Follow-Up: At [facility], you and your health needs are our priority.  As part of our continuing mission to  provide you with exceptional heart care, we have created designated Provider Care Teams.  These Care Teams include your primary Cardiologist (physician) and Advanced Practice Providers (APPs -  Physician Assistants and Nurse Practitioners) who all work together to provide you with the care you need, when you need it.  We recommend signing up for the patient portal called "MyChart".  Sign up information is provided on this After Visit Summary.  MyChart is used to connect with patients for Virtual Visits (Telemedicine).  Patients are able to view lab/test results, encounter notes, upcoming appointments, etc.  Non-urgent messages can be sent to your provider as well.   To learn more about what you can do with MyChart, go to [email or web address].    Signed, Kathyrn Drown, NP  04/27/2021 8:59 AM    [facility]

## 2021-04-26 ENCOUNTER — Telehealth: Payer: Self-pay | Admitting: "Endocrinology

## 2021-04-26 ENCOUNTER — Ambulatory Visit (INDEPENDENT_AMBULATORY_CARE_PROVIDER_SITE_OTHER): Payer: Medicare Other | Admitting: Cardiology

## 2021-04-26 ENCOUNTER — Other Ambulatory Visit: Payer: Self-pay

## 2021-04-26 ENCOUNTER — Ambulatory Visit (HOSPITAL_COMMUNITY): Payer: Medicare Other | Attending: Cardiology

## 2021-04-26 ENCOUNTER — Encounter: Payer: Self-pay | Admitting: Cardiology

## 2021-04-26 VITALS — BP 134/78 | HR 71 | Ht 65.0 in | Wt 184.0 lb

## 2021-04-26 DIAGNOSIS — I10 Essential (primary) hypertension: Secondary | ICD-10-CM

## 2021-04-26 DIAGNOSIS — Z952 Presence of prosthetic heart valve: Secondary | ICD-10-CM

## 2021-04-26 DIAGNOSIS — E782 Mixed hyperlipidemia: Secondary | ICD-10-CM

## 2021-04-26 DIAGNOSIS — I350 Nonrheumatic aortic (valve) stenosis: Secondary | ICD-10-CM | POA: Diagnosis not present

## 2021-04-26 DIAGNOSIS — E049 Nontoxic goiter, unspecified: Secondary | ICD-10-CM

## 2021-04-26 DIAGNOSIS — R079 Chest pain, unspecified: Secondary | ICD-10-CM

## 2021-04-26 DIAGNOSIS — K219 Gastro-esophageal reflux disease without esophagitis: Secondary | ICD-10-CM

## 2021-04-26 LAB — ECHOCARDIOGRAM COMPLETE
AR max vel: 1.11 cm2
AV Area VTI: 1.18 cm2
AV Area mean vel: 1.02 cm2
AV Mean grad: 12 mmHg
AV Peak grad: 21.9 mmHg
Ao pk vel: 2.34 m/s
Area-P 1/2: 2.17 cm2
MV VTI: 1.51 cm2
S' Lateral: 2.9 cm

## 2021-04-26 NOTE — Telephone Encounter (Signed)
Orders updated and sent to [facility]. 

## 2021-04-26 NOTE — Patient Instructions (Signed)
Medication Instructions:  Your physician recommends that you continue on your current medications as directed. Please refer to the Current Medication list given to you today.  *If you need a refill on your cardiac medications before your next appointment, please call your pharmacy*   Lab Work: NONE If you have labs (blood work) drawn today and your tests are completely normal, you will receive your results only by: [place] (if you have MyChart) OR A paper copy in the mail If you have any lab test that is abnormal or we need to change your treatment, we will call you to review the results.   Testing/Procedures: Your physician has requested that you have an echocardiogram. Echocardiography is a painless test that uses sound waves to create images of your heart. It provides your doctor with information about the size and shape of your heart and how well your heart's chambers and valves are working. This procedure takes approximately one hour. There are no restrictions for this procedure. TO BE DONE IN November 2023, SAME DAY AS FOLLOW UP   Follow-Up: At [facility], you and your health needs are our priority.  As part of our continuing mission to provide you with exceptional heart care, we have created designated Provider Care Teams.  These Care Teams include your primary Cardiologist (physician) and Advanced Practice Providers (APPs -  Physician Assistants and Nurse Practitioners) who all work together to provide you with the care you need, when you need it.  We recommend signing up for the patient portal called "MyChart".  Sign up information is provided on this After Visit Summary.  MyChart is used to connect with patients for Virtual Visits (Telemedicine).  Patients are able to view lab/test results, encounter notes, upcoming appointments, etc.  Non-urgent messages can be sent to your provider as well.   To learn more about what you can do with MyChart, go to [email or web address].     Your next appointment:   5 month(s)  The format for your next appointment:   In Person  Provider:   [name], MD   Your physician recommends that you schedule a follow-up appointment in: November 2023 WITH ECHO SAME DAY.

## 2021-04-26 NOTE — Telephone Encounter (Signed)
Can you place lab orders?

## 2021-04-28 DIAGNOSIS — I10 Essential (primary) hypertension: Secondary | ICD-10-CM | POA: Diagnosis not present

## 2021-04-28 DIAGNOSIS — E782 Mixed hyperlipidemia: Secondary | ICD-10-CM | POA: Diagnosis not present

## 2021-04-28 DIAGNOSIS — E049 Nontoxic goiter, unspecified: Secondary | ICD-10-CM | POA: Diagnosis not present

## 2021-04-29 LAB — TSH: TSH: 1.98 u[IU]/mL (ref 0.450–4.500)

## 2021-04-29 LAB — LIPID PANEL
Chol/HDL Ratio: 3.5 ratio (ref 0.0–4.4)
Cholesterol, Total: 139 mg/dL (ref 100–199)
HDL: 40 mg/dL (ref >39)
LDL Chol Calc (NIH): 64 mg/dL (ref 0–99)
Triglycerides: 215 mg/dL — ABNORMAL HIGH (ref 0–149)
VLDL Cholesterol Cal: 35 mg/dL (ref 5–40)

## 2021-04-29 LAB — COMPREHENSIVE METABOLIC PANEL
ALT: 19 IU/L (ref 0–32)
AST: 22 IU/L (ref 0–40)
Albumin/Globulin Ratio: 1.4 (ref 1.2–2.2)
Albumin: 4.2 g/dL (ref 3.7–4.7)
Alkaline Phosphatase: 57 IU/L (ref 44–121)
BUN/Creatinine Ratio: 19 (ref 12–28)
BUN: 21 mg/dL (ref 8–27)
Bilirubin Total: <0.2 mg/dL (ref 0.0–1.2)
CO2: 26 mmol/L (ref 20–29)
Calcium: 10.1 mg/dL (ref 8.7–10.3)
Chloride: 96 mmol/L (ref 96–106)
Creatinine, Ser: 1.13 mg/dL — ABNORMAL HIGH (ref 0.57–1.00)
Globulin, Total: 2.9 g/dL (ref 1.5–4.5)
Glucose: 163 mg/dL — ABNORMAL HIGH (ref 70–99)
Potassium: 4.3 mmol/L (ref 3.5–5.2)
Sodium: 137 mmol/L (ref 134–144)
Total Protein: 7.1 g/dL (ref 6.0–8.5)
eGFR: 49 mL/min/{1.73_m2} — ABNORMAL LOW (ref >59)

## 2021-04-29 LAB — T4, FREE: Free T4: 1.38 ng/dL (ref 0.82–1.77)

## 2021-05-03 ENCOUNTER — Encounter: Payer: Self-pay | Admitting: Nutrition

## 2021-05-03 ENCOUNTER — Ambulatory Visit (INDEPENDENT_AMBULATORY_CARE_PROVIDER_SITE_OTHER): Payer: Medicare Other | Admitting: "Endocrinology

## 2021-05-03 ENCOUNTER — Encounter: Payer: Medicare Other | Attending: "Endocrinology | Admitting: Nutrition

## 2021-05-03 ENCOUNTER — Encounter: Payer: Self-pay | Admitting: "Endocrinology

## 2021-05-03 ENCOUNTER — Other Ambulatory Visit: Payer: Self-pay

## 2021-05-03 VITALS — BP 128/62 | HR 60 | Ht 65.0 in | Wt 185.8 lb

## 2021-05-03 DIAGNOSIS — Z794 Long term (current) use of insulin: Secondary | ICD-10-CM | POA: Diagnosis not present

## 2021-05-03 DIAGNOSIS — N181 Chronic kidney disease, stage 1: Secondary | ICD-10-CM | POA: Insufficient documentation

## 2021-05-03 DIAGNOSIS — E1122 Type 2 diabetes mellitus with diabetic chronic kidney disease: Secondary | ICD-10-CM | POA: Insufficient documentation

## 2021-05-03 DIAGNOSIS — I10 Essential (primary) hypertension: Secondary | ICD-10-CM

## 2021-05-03 DIAGNOSIS — E782 Mixed hyperlipidemia: Secondary | ICD-10-CM | POA: Diagnosis not present

## 2021-05-03 DIAGNOSIS — K219 Gastro-esophageal reflux disease without esophagitis: Secondary | ICD-10-CM | POA: Insufficient documentation

## 2021-05-03 DIAGNOSIS — I200 Unstable angina: Secondary | ICD-10-CM | POA: Diagnosis not present

## 2021-05-03 MED ORDER — FREESTYLE LIBRE 2 READER DEVI: 0 refills | Status: DC

## 2021-05-03 MED ORDER — FREESTYLE LIBRE 2 SENSOR MISC: 1.0000 | 3 refills | Status: DC

## 2021-05-03 NOTE — Progress Notes (Signed)
05/03/2021                     Endocrinology follow-up note   Subjective:    Patient ID: Joanna Brown, female    DOB: 1940/08/31. She is being seen in follow-up for uncontrolled type 2 diabetes, complicated by CKD, hyperlipidemia, hypertension.     PMD:   Fayrene Helper, MD  Past Medical History:  Diagnosis Date   Anginal pain ([place])    Arthritis    OA   Diabetes mellitus    Dyspnea    Female bladder prolapse    GERD (gastroesophageal reflux disease)    Glaucoma    Hyperlipidemia    Hypertension    echo and stress 4/10 reports on chart, EKG ` LOV 9/12 on chart   S/P TAVR (transcatheter aortic valve replacement) 03/23/2021   Edwards 63m S3U TF approach with Dr. MAngelena Formand Dr. BCyndia Bent  Severe aortic stenosis    Thyroid disease    Past Surgical History:  Procedure Laterality Date   ABDOMINAL HYSTERECTOMY     ANTERIOR AND POSTERIOR REPAIR  04/26/2011   Procedure: ANTERIOR (CYSTOCELE) AND POSTERIOR REPAIR (RECTOCELE);  Surgeon: SReece Packer MD;  Location: [facility];  Service: Urology;  Laterality: N/A;   BIOPSY  05/25/2020   Procedure: BIOPSY;  Surgeon: DDoran Stabler MD;  Location: [facility];  Service: Gastroenterology;;   CATARACT EXTRACTION Bilateral    with IOL   CHOLECYSTECTOMY  2009   COLONOSCOPY N/A 12/02/2013   three colon polyps removed, small internal hemorrhoids. Hyperplastic polyps   COLONOSCOPY N/A 11/20/2019   pancolonic diverticulosis, two 10-11 mm polyps in ascending colon, one 5 mm polyp in cecum, ascending colon with superficially invasive adenocarcinoma arising in background of sessile serrated polyps with low and high grade cytologic dysplasia.   COLONOSCOPY     COLONOSCOPY W/ POLYPECTOMY     COLONOSCOPY WITH PROPOFOL N/A 05/25/2020   diverticulosis in right colon, redundant colon. Caution warranted on future colonoscopy in light of age, cardiac condition, challenging anatomy.    DILATION AND CURETTAGE OF UTERUS     pt states  this was in the 1970's   ESOPHAGOGASTRODUODENOSCOPY (EGD) WITH PROPOFOL N/A 05/25/2020   Grade 1 esophageal varices, single mucosal nodule in stomach s/p biopsy. (hyperplastic)>    LEFT HEART CATH  09/10/2008   normal coronary arteries, normal LV systolic function, EF 602%([place]. [place]   under arm   NM MYOCAR PERF WALL MOTION  2010   dipyridamole - mild-mod in intenstiy perfusion defect in mid anterior, mid anteroseptal wall, EF 70%   OVARY SURGERY     bilateral tumors removed   POLYPECTOMY  11/20/2019   Procedure: POLYPECTOMY;  Surgeon: RDaneil Dolin MD;  [facility];  Service: Endoscopy;;  hot and cold snare cecal polyp, and asending polyps x 2   RIGHT/LEFT HEART CATH AND CORONARY ANGIOGRAPHY N/A 10/02/2020   Procedure: RIGHT/LEFT HEART CATH AND CORONARY ANGIOGRAPHY;  Surgeon: JMartinique Peter M, MD;  [facility];  Service: Cardiovascular;  Laterality: N/A;   THYROIDECTOMY     THYROIDECTOMY  02/2008   TRANSCATHETER AORTIC VALVE REPLACEMENT, TRANSFEMORAL N/A 03/23/2021   Procedure: TRANSCATHETER AORTIC VALVE REPLACEMENT, TRANSFEMORAL;  Surgeon: MBurnell Blanks MD;  [facility];  Service: Open Heart Surgery;  Laterality: N/A;   TRANSTHORACIC ECHOCARDIOGRAM  08/2011   EF=>55%, mild conc LVH; trace MR; mild  TR; mild-mod AV calcification with mild valvular AV stenosis   VAGINAL PROLAPSE REPAIR  04/26/2011   Procedure: VAGINAL VAULT SUSPENSION;  Surgeon: Reece Packer, MD;  Location: [facility];  Service: Urology;  Laterality: N/A;  with Graft  10x6   Social History   Socioeconomic History   Marital status: Married    Spouse name: [name]   Number of children: 1   Years of education: Trade   Highest education level: 12th grade  Occupational History   Occupation: Retired   Occupation: Writer  Tobacco Use   Smoking status: Never   Smokeless tobacco: Never  Scientific laboratory technician  Use: Never used  Substance and Sexual Activity   Alcohol use: Not Currently   Drug use: No   Sexual activity: Yes  Other Topics Concern   Not on file  Social History Narrative   Patient lives at home with spouse. 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 LIVED IN [place] FOR 50 YRS AND CAME BACK TO [place] ~2009. Caffeine Use: 1 cup of coffee daily. HAD ONE CHILD: PASSED 5 YRS. HAVE THREE GRAND-KIDS AND TWO GREAT GRANDS.    Social Determinants of Health   Financial Resource Strain: Low Risk    Difficulty of Paying Living Expenses: Not hard at all  Food Insecurity: No Food Insecurity   Worried About Charity fundraiser in the Last Year: Never true   Thermal in the Last Year: Never true  Transportation Needs: No Transportation Needs   Lack of Transportation (Medical): No   Lack of Transportation (Non-Medical): No  Physical Activity: Sufficiently Active   Days of Exercise per Week: 5 days   Minutes of Exercise per Session: 30 min  Stress: No Stress Concern Present   Feeling of Stress : Not at all  Social Connections: Moderately Integrated   Frequency of Communication with Friends and Family: More than three times a week   Frequency of Social Gatherings with Friends and Family: Twice a week   Attends Religious Services: More than 4 times per year   Active Member of [employer] or Organizations: No   Attends Archivist Meetings: Never   Marital Status: Married   Outpatient Encounter Medications as of 05/03/2021  Medication Sig   Continuous Blood Gluc Receiver (FREESTYLE LIBRE 2 READER) DEVI As directed   Continuous Blood Gluc Sensor (FREESTYLE LIBRE 2 SENSOR) MISC 1 Piece by Does not apply route every 14 (fourteen) days.   Accu-Chek FastClix Lancets MISC 1 each by Other route 2 (two) times daily.   acetaminophen (TYLENOL) 500 MG tablet Take 500-1,000 mg by mouth every 6 (six) hours as needed (pain).   ALPHAGAN P 0.1 % SOLN Place 1 drop into both eyes 2  (two) times daily.   amLODipine (NORVASC) 10 MG tablet TAKE 1 TABLET(10 MG) BY MOUTH EVERY MORNING   amoxicillin (AMOXIL) 500 MG capsule Take 4 capsules (2,000 mg total) by mouth as needed (1 hour prior to dental work).   Ascorbic Acid (VITAMIN C) 1000 MG tablet Take 1,000 mg by mouth 4 (four) times a week. AT NIGHT   aspirin EC 81 MG tablet Take 81 mg by mouth every other day. In the morning. Swallow whole.   benazepril (LOTENSIN) 40 MG tablet TAKE 1 TABLET(40 MG) BY MOUTH DAILY   Blood Glucose Monitoring Suppl (ACCU-CHEK GUIDE) w/Device KIT 1 each by Does not apply route 4 (four) times daily.   cholecalciferol (VITAMIN D3) 25  MCG (1000 UT) tablet Take 1,000 Units by mouth in the morning.   clotrimazole-betamethasone (LOTRISONE) cream Apply twice daily for 1 week, to affected area , then as needed   ezetimibe (ZETIA) 10 MG tablet Take 1 tablet (10 mg total) by mouth daily.   glipiZIDE (GLUCOTROL XL) 5 MG 24 hr tablet TAKE 1 TABLET(5 MG) BY MOUTH DAILY WITH BREAKFAST   glucose blood (ACCU-CHEK GUIDE) test strip USE TO CHECK BLOOD SUGAR TWICE DAILY   insulin isophane & regular human KwikPen (HUMULIN 70/30 MIX) (70-30) 100 UNIT/ML KwikPen Inject 60 units with breakfast and 40 units with supper only if glucose is above 90   Insulin Pen Needle (B-D ULTRAFINE III SHORT PEN) 31G X 8 MM MISC USE AS DIRECTED TWICE DAILY WITH INSULIN PENS   latanoprost (XALATAN) 0.005 % ophthalmic solution Place 1 drop into both eyes at bedtime.    LINZESS 290 MCG CAPS capsule TAKE 1 CAPSULE(290 MCG) BY MOUTH DAILY BEFORE AND BREAKFAST   metFORMIN (GLUCOPHAGE) 500 MG tablet TAKE 1 TABLET BY MOUTH EVERY DAY WITH BREAKFAST   Multiple Vitamin (MULTIVITAMIN WITH MINERALS) TABS tablet Take 1 tablet by mouth daily.   Omega-3 Fatty Acids (FISH OIL) 1200 MG CAPS Take 1,200 mg by mouth every morning.   pantoprazole (PROTONIX) 40 MG tablet Take 1 tablet (40 mg total) by mouth daily. 30 minutes before meal   polyethylene glycol  (MIRALAX) 17 g packet Take 17 g by mouth at bedtime.   pravastatin (PRAVACHOL) 80 MG tablet TAKE 1 TABLET(80 MG) BY MOUTH DAILY   triamterene-hydrochlorothiazide (MAXZIDE) 75-50 MG tablet TAKE 1 TABLET BY MOUTH DAILY   No facility-administered encounter medications on file as of 05/03/2021.   ALLERGIES: Allergies  Allergen Reactions   Senokot Wheat Bran [Wheat Bran]     ABDOMINAL CRAMPS   Spironolactone     Stomach problems, vision changes    VACCINATION STATUS: Immunization History  Administered Date(s) Administered   Moderna SARS-COV2 Booster Vaccination 11/19/2020   Moderna Sars-Covid-2 Vaccination 09/13/2019, 10/12/2019, 04/29/2020   Pneumococcal Conjugate-13 12/11/2013   Pneumococcal Polysaccharide-23 01/13/2010   Tdap 10/05/2010    Diabetes She presents for her follow-up diabetic visit. She has type 2 diabetes mellitus. Onset time: She was diagnosed at approximate age of 50 years. Her disease course has been improving. There are no hypoglycemic associated symptoms. Pertinent negatives for hypoglycemia include no confusion, headaches, pallor or seizures. Pertinent negatives for diabetes include no blurred vision, no chest pain, no fatigue, no polydipsia, no polyphagia and no polyuria. There are no hypoglycemic complications. Symptoms are improving. Diabetic complications include nephropathy and retinopathy. Risk factors for coronary artery disease include dyslipidemia, diabetes mellitus, obesity and sedentary lifestyle. Her weight is increasing steadily. She is following a generally unhealthy diet. When asked about meal planning, she reported none. She has had a previous visit with a dietitian. She rarely participates in exercise. Her home blood glucose trend is decreasing steadily. Her breakfast blood glucose range is generally 140-180 mg/dl. Her bedtime blood glucose range is generally 180-200 mg/dl. Her overall blood glucose range is 180-200 mg/dl. (She presents with improving BG  profile, a1c of 7.4 % improving from  8.8%.  She did not document any hypoglycemia.    ) Eye exam is current.  Hyperlipidemia This is a chronic problem. The current episode started more than 1 year ago. The problem is uncontrolled. Exacerbating diseases include diabetes and obesity. Pertinent negatives include no chest pain, myalgias or shortness of breath. Current antihyperlipidemic treatment includes statins.  Risk factors for coronary artery disease include dyslipidemia, diabetes mellitus, hypertension, obesity, a sedentary lifestyle and post-menopausal.  Hypertension This is a chronic problem. The current episode started more than 1 year ago. Pertinent negatives include no blurred vision, chest pain, headaches, palpitations or shortness of breath. Risk factors for coronary artery disease include dyslipidemia, diabetes mellitus, obesity and sedentary lifestyle. Hypertensive end-organ damage includes retinopathy.  Nodular goiter She Patient is being seen today for a new issue with her thyroid.  She was found to have 2.6 cm nodule on right lobe of her thyroid while undergoing CT scan of the chest during cancer surveillance.  She has previous history of left hemithyroidectomy approximately 10 years ago for multinodular goiter.  She is not on thyroid hormone supplement or replacement.  She denies dysphagia, shortness of breath, nor voice change. -Her subsequent thyroid ultrasound confirms multinodular right lobe of the thyroid with surgically absent left lobe.  Her right thyroid lobe nodule did not meet criteria for biopsy.  Review of systems  Constitutional: + Minimally fluctuating body weight, current  Body mass index is 30.92 kg/m. , no fatigue, no subjective hyperthermia, no subjective hypothermia    Objective:    BP 128/62    Pulse 60    Ht _0  (1.651 m)    Wt 185 lb 12.8 oz (84.3 kg)    BMI 30.92 kg/m   Wt Readings from Last 3 Encounters:  05/03/21 185 lb 12.8 oz (84.3 kg)  04/26/21  184 lb (83.5 kg)  03/31/21 180 lb (81.6 kg)    Physical Exam- Limited  Constitutional:  Body mass index is 30.92 kg/m. , not in acute distress, normal state of mind    CMP ( most recent) CMP     Component Value Date/Time   NA 137 04/28/2021 0818   K 4.3 04/28/2021 0818   CL 96 04/28/2021 0818   CO2 26 04/28/2021 0818   GLUCOSE 163 (H) 04/28/2021 0818   GLUCOSE 151 (H) 03/24/2021 0202   BUN 21 04/28/2021 0818   CREATININE 1.13 (H) 04/28/2021 0818   CREATININE 1.11 (H) 03/02/2020 0848   CALCIUM 10.1 04/28/2021 0818   PROT 7.1 04/28/2021 0818   ALBUMIN 4.2 04/28/2021 0818   AST 22 04/28/2021 0818   ALT 19 04/28/2021 0818   ALKPHOS 57 04/28/2021 0818   BILITOT <0.2 04/28/2021 0818   GFRNONAA 52 (L) 03/24/2021 0202   GFRNONAA 57 (L) 07/30/2019 0718   GFRAA 66 07/30/2019 0718    Diabetic Labs (most recent): Lab Results  Component Value Date   HGBA1C 7.4 (H) 03/22/2021   HGBA1C 7.8 (A) 12/31/2020   HGBA1C 8.4 (A) 09/08/2020    Lipid Panel     Component Value Date/Time   CHOL 139 04/28/2021 0818   TRIG 215 (H) 04/28/2021 0818   HDL 40 04/28/2021 0818   CHOLHDL 3.5 04/28/2021 0818   CHOLHDL 4.7 07/30/2019 0718   VLDL 39 12/05/2017 0832   LDLCALC 64 04/28/2021 0818   LDLCALC 99 07/30/2019 0718   Incidental finding on chest CT on December 05, 2019 Enlarged  multinodular remnant right thyroid with dominant 2.6 cm hypodense Nodule.   Thyroid ultrasound on December 19, 2019: Right lobe 4.4 cm, left lobe absent surgically. No adenopathy   IMPRESSION: Surgical changes of left hemithyroidectomy.  Multinodular thyroid.  3.9 cm and 1.6 cm cystic/almost completely cystic nodules with smooth margins, No thyroid nodule meets criteria for biopsy or surveillance, as designated by the newly established ACR TI-RADS criteria.  Assessment & Plan:     1.  Nodular goiter: Patient with remote past history of left hemithyroidectomy for multinodular goiter.  She did not require  thyroid hormone supplement.  She was incidentally found to have 2.6 cm nodule in the right lobe. -Her subsequent dedicated thyroid ultrasound confirmed 2 nodules on the right lobe of her thyroid which did not meet any criteria for biopsy. -She will not need any antithyroid intervention at this time, TFTs are consistent with euthyroid state.  She will be considered for repeat ultrasound in 1 year.  2. Type 2 diabetes mellitus with stage 1 chronic kidney disease, with long-term current use of insulin ([place])  She presents with improving BG profile, a1c of 7.4 % improving from  8.8%.  She did not document any hypoglycemia.   She presents with tighter fasting glycemic profile, above target readings postprandially.   Recent labs reviewed, showing improving renal function.     Her diabetes is complicated by CKD obesity/sedentary life and patient remains at a high risk for more acute and chronic complications of diabetes which include CAD, CVA, CKD, retinopathy, and neuropathy. These are all discussed in detail with the patient.  - I have counseled the patient on diet management and weight loss, by adopting a carbohydrate restricted/protein rich diet.  -She still admits to dietary indiscretions including consumption of sweets and sweetened beverages.  - she acknowledges that there is a room for improvement in her food and drink choices. - Suggestion is made for her to avoid simple carbohydrates  from her diet including Cakes, Sweet Desserts, Ice Cream, Soda (diet and regular), Sweet Tea, Candies, Chips, Cookies, Store Bought Juices, Alcohol , Artificial Sweeteners,  Coffee Creamer, and "Sugar-free" Products, Lemonade. This will help patient to have more stable blood glucose profile and potentially avoid unintended weight gain.  The following Lifestyle Medicine recommendations according to [place]  [facility]) were discussed and and offered to patient and she  agrees to start the  journey:  A. Whole Foods, Plant-Based Nutrition comprising of fruits and vegetables, plant-based proteins, whole-grain carbohydrates was discussed in detail with the patient.   A list for source of those nutrients were also provided to the patient.  Patient will use only water or unsweetened tea for hydration. B.  The need to stay away from risky substances including alcohol, smoking; obtaining 7 to 9 hours of restorative sleep, at least 150 minutes of moderate intensity exercise weekly, the importance of healthy social connections,  and stress management techniques were discussed.  - I encouraged the patient to switch to  unprocessed or minimally processed complex starch and increased protein intake (animal or plant source), fruits, and vegetables.  - Patient is advised to stick to a routine mealtimes to eat 3 meals  a day and avoid unnecessary snacks ( to snack only to correct hypoglycemia).   - I have approached patient with the following individualized plan to manage diabetes and patient agrees:   -In light of her presentation with target glycemic profile, she will be not need a higher dose of insulin.  -She is advised to continue  Humulin 70/30 60 units with breakfast and  40 units with supper when Premeal blood glucose readings are above 90 mg per DL.   -She is encouraged to start monitoring blood glucose 4 times a day-before meals and at bedtime .  -Patient is encouraged to call clinic for blood glucose levels less than 70 or above 200 mg /dl. -She  is advised to to continue glipizide 5 mg XL p.o. daily at breakfast, and metformin 500 mg p.o. daily at breakfast.   I prescribed a new meter and testing supplies for her.   - Patient specific target  A1c;  LDL, HDL, Triglycerides, were discussed in detail.  3) BP/HTN:  -Her blood pressure is controlled to target.   She is advised to continue her current blood pressure medications including benazepril 40 mg p.o. daily.   4) Lipids/HPL: Her  recent lipid panel showed uncontrolled LDL at 97.  She is advised to continue Pravastatin 80 mg p.o. nightly.   Side effects and precautions discussed with her.           5)  Weight/Diet: Her BMI is 30.92-complicating her diabetes care.- she is a candidate for moderate weight loss.  CDE Consult has been initiated , exercise, and detailed carbohydrates information provided.  6) Chronic Care/Health Maintenance:  -Patient is on ACEI/ARB and Statin medications and encouraged to continue to follow up with Ophthalmology, Podiatrist at least yearly or according to recommendations, and advised to  stay away from smoking. I have recommended yearly flu vaccine and pneumonia vaccination at least every 5 years; moderate intensity exercise for up to 150 minutes weekly; and  sleep for at least 7 hours a day.  POC ABI for PAD screen was normal on June 10, 2020.  This test will be repeated in January 2027, or sooner if needed.     - I advised patient to maintain close follow up with Fayrene Helper, MD for primary care needs.    I spent 41 minutes in the care of the patient today including review of labs from [facility], Lipids, Thyroid Function, Hematology (current and previous including abstractions from other facilities); face-to-face time discussing  her blood glucose readings/logs, discussing hypoglycemia and hyperglycemia episodes and symptoms, medications doses, her options of short and long term treatment based on the latest standards of care / guidelines;  discussion about incorporating lifestyle medicine;  and documenting the encounter.    Please refer to Patient Instructions for Blood Glucose Monitoring and Insulin/Medications Dosing Guide"  in media tab for additional information. Please  also refer to " Patient Self Inventory" in the Media  tab for reviewed elements of pertinent patient history.  Derinda Late Zachman participated in the discussions, expressed understanding, and voiced agreement with the  above plans.  All questions were answered to her satisfaction. she is encouraged to contact clinic should she have any questions or concerns prior to her return visit.     Follow up plan: - Return in about 4 months (around 09/01/2021) for Bring Meter and Logs- A1c in Office.  Glade Lloyd, MD Phone: [phone]  Fax: [phone]   This note was partially dictated with voice recognition software. Similar sounding words can be transcribed inadequately or may not  be corrected upon review.  05/03/2021, 12:22 PM

## 2021-05-03 NOTE — Patient Instructions (Signed)

## 2021-05-03 NOTE — Patient Instructions (Addendum)
Goals  Increase fresh fruits and vegetables, whole grains Walk 5 times 3 times per day Keep drinking Water Take medications as prescribed.

## 2021-05-03 NOTE — Progress Notes (Signed)
Medical Nutrition Therapy  Follow up Appointment Start time:  1000 Appointment End time: 1030 Primary concerns today: DM Type 2, Overweight Referral diagnosis: E11.8, E66.9 Preferred learning style:  No preferences.  Learning readiness: Ready   NUTRITION ASSESSMENT   Currently taking 70/30 insulin 60 units in am and 40 units  at night. Glipizide 5 mg a day. FBS 150's.  7 day avg  176 mg/dl, 14 day avg 168 mg/dl 30 day 166 mg/dl. Saw Dr. Dorris Fetch today. Doing much better.   Changes made: Cut out bread, drinking more water,  Anthropometrics  Wt Readings from Last 3 Encounters:  05/03/21 185 lb 12.8 oz (84.3 kg)  04/26/21 184 lb (83.5 kg)  03/31/21 180 lb (81.6 kg)   Ht Readings from Last 3 Encounters:  05/03/21 5\' 5"  (1.651 m)  04/26/21 5\' 5"  (1.651 m)  03/31/21 5\' 5"  (1.651 m)   There is no height or weight on file to calculate BMI. @BMIFA @ Facility age limit for growth percentiles is 20 years. Facility age limit for growth percentiles is 20 years.    Clinical Medical Hx: DM, Obesity.  Medications: Glipizide, 70/30 60 units in am and 40 units pm Labs:  Lab Results  Component Value Date   HGBA1C 7.4 (H) 03/22/2021   CMP Latest Ref Rng & Units 04/28/2021 03/24/2021 03/23/2021  Glucose 70 - 99 mg/dL 163(H) 151(H) 122(H)  BUN 8 - 27 mg/dL 21 17 17   Creatinine 0.57 - 1.00 mg/dL 1.13(H) 1.09(H) 1.00  Sodium 134 - 144 mmol/L 137 135 139  Potassium 3.5 - 5.2 mmol/L 4.3 3.8 3.2(L)  Chloride 96 - 106 mmol/L 96 98 101  CO2 20 - 29 mmol/L 26 27 -  Calcium 8.7 - 10.3 mg/dL 10.1 9.1 -  Total Protein 6.0 - 8.5 g/dL 7.1 - -  Total Bilirubin 0.0 - 1.2 mg/dL <0.2 - -  Alkaline Phos 44 - 121 IU/L 57 - -  AST 0 - 40 IU/L 22 - -  ALT 0 - 32 IU/L 19 - -   Lipid Panel     Component Value Date/Time   CHOL 139 04/28/2021 0818   TRIG 215 (H) 04/28/2021 0818   HDL 40 04/28/2021 0818   CHOLHDL 3.5 04/28/2021 0818   CHOLHDL 4.7 07/30/2019 0718   VLDL 39 12/05/2017 0832   LDLCALC 64  04/28/2021 0818   LDLCALC 99 07/30/2019 0718   LABVLDL 35 04/28/2021 0818    Notable Signs/Symptoms: Sometimes she gets shaky or is really thirsty  Lifestyle & Dietary Hx Wants to improve her DM. Eats 3 meals per day. Cooks from scratch.  Admits to sometimes missing her insulin because she forgets or she eats later after supper.   Estimated daily fluid intake: 84 oz Supplements:  Sleep: good Stress / self-care: none Current average weekly physical activity: Walks  24-Hr Dietary Recall First Meal: Cheerios, almond milk,  Snack:  Second Meal: [place] sandwich with Air cabin crew, water Snack:  Third Meal: Turnip greens, squash, cornbread, water Snack: shortbread cookie Beverages: water  Estimated Energy Needs Calories: 1200  Carbohydrate: 135g Protein: 90g Fat: 33g   NUTRITION DIAGNOSIS  NB-1.1 Food and nutrition-related knowledge deficit As related to Diabetes .  As evidenced by A1C 7.8%.   NUTRITION INTERVENTION  Nutrition education (E-1) on the following topics:  Nutrition and Diabetes education provided on My Plate, CHO counting, meal planning, portion sizes, timing of meals, avoiding snacks between meals unless having a low blood sugar, target ranges for A1C and blood sugars, signs/symptoms  and treatment of hyper/hypoglycemia, monitoring blood sugars, taking medications as prescribed, benefits of exercising 30 minutes per day and prevention of complications of DM.   Handouts Provided Include  My Plate Meal Plan Card   Learning Style & Readiness for Change Teaching method utilized: Visual & Auditory  Demonstrated degree of understanding via: Teach Back  Barriers to learning/adherence to lifestyle change: none  Goals Established by Pt  Goals  Increase fresh fruits and vegetables, whole grains Walk 5 times 3 times per day Keep drinking Water Take medications as prescribed.  MONITORING & EVALUATION Dietary intake, weekly physical activity, and blood sugars   in 1 month.  Next Steps  Patient is to work on watching portions and timing of meals.Marland Kitchen

## 2021-05-11 ENCOUNTER — Other Ambulatory Visit: Payer: Self-pay | Admitting: "Endocrinology

## 2021-05-21 ENCOUNTER — Ambulatory Visit
Admission: EM | Admit: 2021-05-21 | Discharge: 2021-05-21 | Disposition: A | Discharge status: Home / Self Care | Payer: Medicare Other

## 2021-05-21 ENCOUNTER — Emergency Department (HOSPITAL_COMMUNITY): Payer: Medicare Other

## 2021-05-21 ENCOUNTER — Emergency Department (HOSPITAL_COMMUNITY)
Admission: EM | Admit: 2021-05-21 | Discharge: 2021-05-21 | Disposition: A | Discharge status: Home / Self Care | Payer: Medicare Other | Attending: Emergency Medicine | Admitting: Emergency Medicine

## 2021-05-21 ENCOUNTER — Other Ambulatory Visit: Payer: Self-pay

## 2021-05-21 ENCOUNTER — Encounter (HOSPITAL_COMMUNITY): Payer: Self-pay | Admitting: *Deleted

## 2021-05-21 DIAGNOSIS — I10 Essential (primary) hypertension: Secondary | ICD-10-CM

## 2021-05-21 DIAGNOSIS — R200 Anesthesia of skin: Secondary | ICD-10-CM

## 2021-05-21 DIAGNOSIS — Z79899 Other long term (current) drug therapy: Secondary | ICD-10-CM | POA: Insufficient documentation

## 2021-05-21 DIAGNOSIS — J3489 Other specified disorders of nose and nasal sinuses: Secondary | ICD-10-CM | POA: Diagnosis not present

## 2021-05-21 DIAGNOSIS — Z7982 Long term (current) use of aspirin: Secondary | ICD-10-CM | POA: Diagnosis not present

## 2021-05-21 DIAGNOSIS — R202 Paresthesia of skin: Secondary | ICD-10-CM | POA: Diagnosis not present

## 2021-05-21 DIAGNOSIS — I350 Nonrheumatic aortic (valve) stenosis: Secondary | ICD-10-CM | POA: Diagnosis not present

## 2021-05-21 DIAGNOSIS — Z7984 Long term (current) use of oral hypoglycemic drugs: Secondary | ICD-10-CM | POA: Insufficient documentation

## 2021-05-21 DIAGNOSIS — Z20822 Contact with and (suspected) exposure to covid-19: Secondary | ICD-10-CM | POA: Insufficient documentation

## 2021-05-21 DIAGNOSIS — E119 Type 2 diabetes mellitus without complications: Secondary | ICD-10-CM | POA: Diagnosis not present

## 2021-05-21 DIAGNOSIS — Z794 Long term (current) use of insulin: Secondary | ICD-10-CM | POA: Diagnosis not present

## 2021-05-21 DIAGNOSIS — R000 Tachycardia, unspecified: Secondary | ICD-10-CM | POA: Diagnosis not present

## 2021-05-21 LAB — CBC WITH DIFFERENTIAL/PLATELET
Abs Immature Granulocytes: 0.02 10*3/uL (ref 0.00–0.07)
Basophils Absolute: 0.1 10*3/uL (ref 0.0–0.1)
Basophils Relative: 1 %
Eosinophils Absolute: 0.2 10*3/uL (ref 0.0–0.5)
Eosinophils Relative: 2 %
HCT: 40.8 % (ref 36.0–46.0)
Hemoglobin: 13.2 g/dL (ref 12.0–15.0)
Immature Granulocytes: 0 %
Lymphocytes Relative: 32 %
Lymphs Abs: 2.6 10*3/uL (ref 0.7–4.0)
MCH: 27.8 pg (ref 26.0–34.0)
MCHC: 32.4 g/dL (ref 30.0–36.0)
MCV: 85.9 fL (ref 80.0–100.0)
Monocytes Absolute: 0.5 10*3/uL (ref 0.1–1.0)
Monocytes Relative: 6 %
Neutro Abs: 4.7 10*3/uL (ref 1.7–7.7)
Neutrophils Relative %: 59 %
Platelets: 161 10*3/uL (ref 150–400)
RBC: 4.75 MIL/uL (ref 3.87–5.11)
RDW: 13.5 % (ref 11.5–15.5)
WBC: 8.1 10*3/uL (ref 4.0–10.5)
nRBC: 0 % (ref 0.0–0.2)

## 2021-05-21 LAB — URINALYSIS, ROUTINE W REFLEX MICROSCOPIC
Bilirubin Urine: NEGATIVE
Glucose, UA: NEGATIVE mg/dL
Hgb urine dipstick: NEGATIVE
Ketones, ur: NEGATIVE mg/dL
Leukocytes,Ua: NEGATIVE
Nitrite: NEGATIVE
Protein, ur: NEGATIVE mg/dL
Specific Gravity, Urine: 1.006 (ref 1.005–1.030)
pH: 7 (ref 5.0–8.0)

## 2021-05-21 LAB — BASIC METABOLIC PANEL
Anion gap: 9 (ref 5–15)
BUN: 18 mg/dL (ref 8–23)
CO2: 30 mmol/L (ref 22–32)
Calcium: 10.3 mg/dL (ref 8.9–10.3)
Chloride: 99 mmol/L (ref 98–111)
Creatinine, Ser: 1.13 mg/dL — ABNORMAL HIGH (ref 0.44–1.00)
GFR, Estimated: 49 mL/min — ABNORMAL LOW (ref >60)
Glucose, Bld: 92 mg/dL (ref 70–99)
Potassium: 3.7 mmol/L (ref 3.5–5.1)
Sodium: 138 mmol/L (ref 135–145)

## 2021-05-21 LAB — RESP PANEL BY RT-PCR (FLU A&B, COVID) ARPGX2
Influenza A by PCR: NEGATIVE
Influenza B by PCR: NEGATIVE
SARS Coronavirus 2 by RT PCR: NEGATIVE

## 2021-05-21 NOTE — ED Triage Notes (Signed)
States she experienced some facial numbness/irritation after burning sulfur this am. States she went to urgent care for treatment and was advised to come here for CT

## 2021-05-21 NOTE — ED Notes (Signed)
Patient transported to MRI 

## 2021-05-21 NOTE — Discharge Instructions (Addendum)
I am concerned that your facial tingling/numbness may be a transient ischemic attack (TIA). This can be dangerous as a sign of a stroke. We have discussed your need for further evaluation in the hospital including consideration for a head CT scan. You've declined transport by ambulance and I am in agreement. Go straight to the hospital now.

## 2021-05-21 NOTE — ED Provider Notes (Signed)
Joanna Brown   MRN: [id number] DOB: [birth year]  Subjective:   Joanna Brown is a 81 y.o. female presenting for acute onset of right-sided facial tingling/numbness from the nose spreading across the right side of her face.  Patient states that the symptoms are improved but she is concerned and wanted to get evaluated for an allergic reaction.  She states that she was around a sulfur powder when the symptoms started.  No headache, vision changes, confusion, weakness, chest pain, shortness of breath.  She does have a history of severe aortic stenosis, essential hypertension.  She is status post TAVR.  No history of stroke.  No history of MI.  No current facility-administered medications for this encounter.  Current Outpatient Medications:    Accu-Chek FastClix Lancets MISC, 1 each by Other route 2 (two) times daily., Disp: 102 each, Rfl: 2   ACCU-CHEK GUIDE test strip, USE TO CHECK BLOOD SUGAR TWICE DAILY, Disp: 150 strip, Rfl: 1   acetaminophen (TYLENOL) 500 MG tablet, Take 500-1,000 mg by mouth every 6 (six) hours as needed (pain)., Disp: , Rfl:    ALPHAGAN P 0.1 % SOLN, Place 1 drop into both eyes 2 (two) times daily., Disp: , Rfl:    amLODipine (NORVASC) 10 MG tablet, TAKE 1 TABLET(10 MG) BY MOUTH EVERY MORNING, Disp: 90 tablet, Rfl: 3   amoxicillin (AMOXIL) 500 MG capsule, Take 4 capsules (2,000 mg total) by mouth as needed (1 hour prior to dental work)., Disp: 12 capsule, Rfl: 2   Ascorbic Acid (VITAMIN C) 1000 MG tablet, Take 1,000 mg by mouth 4 (four) times a week. AT NIGHT, Disp: , Rfl:    aspirin EC 81 MG tablet, Take 81 mg by mouth every other day. In the morning. Swallow whole., Disp: , Rfl:    benazepril (LOTENSIN) 40 MG tablet, TAKE 1 TABLET(40 MG) BY MOUTH DAILY, Disp: 90 tablet, Rfl: 1   Blood Glucose Monitoring Suppl (ACCU-CHEK GUIDE) w/Device KIT, 1 each by Does not apply route 4 (four) times daily., Disp: 1 kit, Rfl: 0   cholecalciferol (VITAMIN D3) 25 MCG  (1000 UT) tablet, Take 1,000 Units by mouth in the morning., Disp: , Rfl:    clotrimazole-betamethasone (LOTRISONE) cream, Apply twice daily for 1 week, to affected area , then as needed, Disp: 45 g, Rfl: 0   Continuous Blood Gluc Receiver (FREESTYLE LIBRE 2 READER) DEVI, As directed, Disp: 1 each, Rfl: 0   Continuous Blood Gluc Sensor (FREESTYLE LIBRE 2 SENSOR) MISC, 1 Piece by Does not apply route every 14 (fourteen) days., Disp: 2 each, Rfl: 3   ezetimibe (ZETIA) 10 MG tablet, Take 1 tablet (10 mg total) by mouth daily., Disp: 90 tablet, Rfl: 3   glipiZIDE (GLUCOTROL XL) 5 MG 24 hr tablet, TAKE 1 TABLET(5 MG) BY MOUTH DAILY WITH BREAKFAST, Disp: 90 tablet, Rfl: 0   insulin isophane & regular human KwikPen (HUMULIN 70/30 MIX) (70-30) 100 UNIT/ML KwikPen, Inject 60 units with breakfast and 40 units with supper only if glucose is above 90, Disp: 60 mL, Rfl: 3   Insulin Pen Needle (B-D ULTRAFINE III SHORT PEN) 31G X 8 MM MISC, USE AS DIRECTED TWICE DAILY WITH INSULIN PENS, Disp: 100 each, Rfl: 5   latanoprost (XALATAN) 0.005 % ophthalmic solution, Place 1 drop into both eyes at bedtime. , Disp: , Rfl:    LINZESS 290 MCG CAPS capsule, TAKE 1 CAPSULE(290 MCG) BY MOUTH DAILY BEFORE AND BREAKFAST, Disp: 90 capsule, Rfl: 3   metFORMIN (GLUCOPHAGE) 500  MG tablet, TAKE 1 TABLET BY MOUTH EVERY DAY WITH BREAKFAST, Disp: 60 tablet, Rfl: 2   Multiple Vitamin (MULTIVITAMIN WITH MINERALS) TABS tablet, Take 1 tablet by mouth daily., Disp: , Rfl:    Omega-3 Fatty Acids (FISH OIL) 1200 MG CAPS, Take 1,200 mg by mouth every morning., Disp: , Rfl:    pantoprazole (PROTONIX) 40 MG tablet, Take 1 tablet (40 mg total) by mouth daily. 30 minutes before meal, Disp: 90 tablet, Rfl: 1   polyethylene glycol (MIRALAX) 17 g packet, Take 17 g by mouth at bedtime., Disp: 14 each, Rfl: 0   pravastatin (PRAVACHOL) 80 MG tablet, TAKE 1 TABLET(80 MG) BY MOUTH DAILY, Disp: 90 tablet, Rfl: 3   triamterene-hydrochlorothiazide (MAXZIDE)  75-50 MG tablet, TAKE 1 TABLET BY MOUTH DAILY, Disp: 90 tablet, Rfl: 2   Allergies  Allergen Reactions   Senokot Wheat Bran [Wheat Bran]     ABDOMINAL CRAMPS   Spironolactone     Stomach problems, vision changes     Past Medical History:  Diagnosis Date   Anginal pain (HCC)    Arthritis    OA   Diabetes mellitus    Dyspnea    Female bladder prolapse    GERD (gastroesophageal reflux disease)    Glaucoma    Hyperlipidemia    Hypertension    echo and stress 4/10 reports on chart, EKG ` LOV 9/12 on chart   S/P TAVR (transcatheter aortic valve replacement) 03/23/2021   Edwards 20m S3U TF approach with Dr. MAngelena Formand Dr. BCyndia Bent  Severe aortic stenosis    Thyroid disease      Past Surgical History:  Procedure Laterality Date   ABDOMINAL HYSTERECTOMY     ANTERIOR AND POSTERIOR REPAIR  04/26/2011   Procedure: ANTERIOR (CYSTOCELE) AND POSTERIOR REPAIR (RECTOCELE);  Surgeon: SReece Packer MD;  Location: [facility];  Service: Urology;  Laterality: N/A;   BIOPSY  05/25/2020   Procedure: BIOPSY;  Surgeon: DDoran Stabler MD;  Location: [facility];  Service: Gastroenterology;;   CATARACT EXTRACTION Bilateral    with IOL   CHOLECYSTECTOMY  2009   COLONOSCOPY N/A 12/02/2013   three colon polyps removed, small internal hemorrhoids. Hyperplastic polyps   COLONOSCOPY N/A 11/20/2019   pancolonic diverticulosis, two 10-11 mm polyps in ascending colon, one 5 mm polyp in cecum, ascending colon with superficially invasive adenocarcinoma arising in background of sessile serrated polyps with low and high grade cytologic dysplasia.   COLONOSCOPY     COLONOSCOPY W/ POLYPECTOMY     COLONOSCOPY WITH PROPOFOL N/A 05/25/2020   diverticulosis in right colon, redundant colon. Caution warranted on future colonoscopy in light of age, cardiac condition, challenging anatomy.    DILATION AND CURETTAGE OF UTERUS     pt states this was in the 1970's   ESOPHAGOGASTRODUODENOSCOPY (EGD) WITH PROPOFOL  N/A 05/25/2020   Grade 1 esophageal varices, single mucosal nodule in stomach s/p biopsy. (hyperplastic)>    LEFT HEART CATH  09/10/2008   normal coronary arteries, normal LV systolic function, EF 625%(Dr. HNorlene Duel   [place] 1997   under arm   NM MYOCAR PERF WALL MOTION  2010   dipyridamole - mild-mod in intenstiy perfusion defect in mid anterior, mid anteroseptal wall, EF 70%   OVARY SURGERY     bilateral tumors removed   POLYPECTOMY  11/20/2019   Procedure: POLYPECTOMY;  Surgeon: RDaneil Dolin MD;  Location: [facility];  Service: Endoscopy;;  hot and cold snare  cecal polyp, and asending polyps x 2   RIGHT/LEFT HEART CATH AND CORONARY ANGIOGRAPHY N/A 10/02/2020   Procedure: RIGHT/LEFT HEART CATH AND CORONARY ANGIOGRAPHY;  Surgeon: Martinique, Peter M, MD;  Location: [facility];  Service: Cardiovascular;  Laterality: N/A;   THYROIDECTOMY     THYROIDECTOMY  02/2008   TRANSCATHETER AORTIC VALVE REPLACEMENT, TRANSFEMORAL N/A 03/23/2021   Procedure: TRANSCATHETER AORTIC VALVE REPLACEMENT, TRANSFEMORAL;  Surgeon: Burnell Blanks, MD;  Location: [facility];  Service: Open Heart Surgery;  Laterality: N/A;   TRANSTHORACIC ECHOCARDIOGRAM  08/2011   EF=>55%, mild conc LVH; trace MR; mild TR; mild-mod AV calcification with mild valvular AV stenosis   VAGINAL PROLAPSE REPAIR  04/26/2011   Procedure: VAGINAL VAULT SUSPENSION;  Surgeon: Reece Packer, MD;  Location: [facility];  Service: Urology;  Laterality: N/A;  with Graft  10x6    Family History  Problem Relation Age of Onset   Stomach cancer Mother 67   Heart disease Mother 67       heart disease   Heart disease Father 62       MI   Stroke Maternal Grandfather    Heart attack Paternal Grandfather    Hypertension Brother    Bone cancer Brother    Hypertension Sister    Liver cancer Sister    Hypertension Sister    Hypertension Child    Colon cancer Neg Hx    Colon polyps Neg Hx     Pancreatic cancer Neg Hx    Esophageal cancer Neg Hx    Rectal cancer Neg Hx     Social History   Tobacco Use   Smoking status: Never   Smokeless tobacco: Never  Vaping Use   Vaping Use: Never used  Substance Use Topics   Alcohol use: Not Currently   Drug use: No    ROS   Objective:   Vitals: BP (!) 155/79 (BP Location: Right Arm)    Pulse (!) 103    Temp 98.7 F (37.1 C) (Oral)    Resp 18    SpO2 98%   Physical Exam Constitutional:      General: She is not in acute distress.    Appearance: Normal appearance. She is well-developed. She is not ill-appearing, toxic-appearing or diaphoretic.  HENT:     Head: Normocephalic and atraumatic.     Right Ear: Tympanic membrane, ear canal and external ear normal. No drainage or tenderness. No middle ear effusion. Tympanic membrane is not erythematous.     Left Ear: Tympanic membrane, ear canal and external ear normal. No drainage or tenderness.  No middle ear effusion. Tympanic membrane is not erythematous.     Nose: Nose normal. No congestion or rhinorrhea.     Mouth/Throat:     Mouth: Mucous membranes are moist. No oral lesions.     Pharynx: No pharyngeal swelling, oropharyngeal exudate, posterior oropharyngeal erythema or uvula swelling.     Tonsils: No tonsillar exudate or tonsillar abscesses.     Comments: No oral or facial swelling. Eyes:     General: No scleral icterus.       Right eye: No discharge.        Left eye: No discharge.     Extraocular Movements: Extraocular movements intact.     Right eye: Normal extraocular motion.     Left eye: Normal extraocular motion.     Conjunctiva/sclera: Conjunctivae normal.     Pupils: Pupils are equal, round, and reactive to light.  Cardiovascular:     Rate and Rhythm: Normal rate and regular rhythm.     Pulses: Normal pulses.     Heart sounds: Normal heart sounds. No murmur heard.   No friction rub. No gallop.  Pulmonary:     Effort: Pulmonary effort is normal. No respiratory  distress.     Breath sounds: Normal breath sounds. No stridor. No wheezing, rhonchi or rales.  Musculoskeletal:     Cervical back: Normal range of motion and neck supple.  Lymphadenopathy:     Cervical: No cervical adenopathy.  Skin:    General: Skin is warm and dry.     Findings: No rash.  Neurological:     General: No focal deficit present.     Mental Status: She is alert and oriented to person, place, and time.     Cranial Nerves: No cranial nerve deficit.     Motor: No weakness.     Coordination: Coordination normal.     Gait: Gait normal.     Comments: Negative Romberg and pronator drift.  Psychiatric:        Mood and Affect: Mood normal.        Behavior: Behavior normal.        Thought Content: Thought content normal.        Judgment: Judgment normal.    Assessment and Plan :   PDMP not reviewed this encounter.  1. Right facial numbness   2. Nasal discomfort   3. Essential hypertension   4. Aortic valve stenosis, etiology of cardiac valve disease unspecified    Am concerned that this is a neurologic symptom representing a possible TIA versus acute encephalopathy.  No signs of an acute stroke on exam.  No suspicion for an allergic reaction.  Patient did not inhale any of the powder.  Counseled on this and discussed need for further evaluation and intervention than we can provide in the urgent care setting including consideration for CT scan of the head through the emergency room.  Patient does not want transport by EMS, contracts for safety and will present to the hospital now.   Jaynee Eagles, PA-C 05/21/21 1300

## 2021-05-21 NOTE — ED Notes (Signed)
Pt d/c home per MD order. Discharge summary reviewed, verbalize understanding. Ambulatory off unit. No s/s of acute distress noted. Discharged home with husband.

## 2021-05-21 NOTE — ED Provider Notes (Signed)
[facility] EMERGENCY DEPARTMENT Provider Note   CSN: [id number] Arrival date & time: 05/21/21  1322     History  Chief Complaint  Patient presents with   Facial Pain    Joanna Brown is a 81 y.o. female.  HPI  Patient with medical history including diabetes, hyperlipidemia, hypertension presents to the emergency department with chief complaint of right-sided numbness.  Patient states this started earlier today, started around 10:30 AM, states that she was burning sulfa and as she was taking it outside to throw it out and she got a big wiff of it in her face and started to have the numbness.  She states it was on her right face and then she went into her right arm and then she noticed some bilateral leg weakness and numbness.  She denies weakness in her right upper extremity, she denies having to drag her arm or her to drag her leg, she was able to ambulate.  She denies difficulty with word finding, no change in her speech, she had no associated headaches or change in vision, states she has felt slightly lightheaded.  She has no other complaints this time, she endorses she is actually feeling better she just has some slight numbness in her face, slight weakness in her legs bilaterally.  She denies any recent head trauma, is not on anticoagulant, she has no history of TIAs or arrhythmias   patient was seen at urgent care earlier today they are concerned for possible neurologic abnormalities and was sent here for further evaluation.  Home Medications Prior to Admission medications   Medication Sig Start Date End Date Taking? Authorizing Provider  Accu-Chek FastClix Lancets MISC 1 each by Other route 2 (two) times daily. 09/14/20   Cassandria Anger, MD  ACCU-CHEK GUIDE test strip USE TO CHECK BLOOD SUGAR TWICE DAILY 05/12/21   Cassandria Anger, MD  acetaminophen (TYLENOL) 500 MG tablet Take 500-1,000 mg by mouth every 6 (six) hours as needed (pain).    [provider]   ALPHAGAN P 0.1 % SOLN Place 1 drop into both eyes 2 (two) times daily. 09/16/19   [provider]  amLODipine (NORVASC) 10 MG tablet TAKE 1 TABLET(10 MG) BY MOUTH EVERY MORNING 05/21/20   Fayrene Helper, MD  amoxicillin (AMOXIL) 500 MG capsule Take 4 capsules (2,000 mg total) by mouth as needed (1 hour prior to dental work). 03/31/21   Eileen Stanford, PA-C  Ascorbic Acid (VITAMIN C) 1000 MG tablet Take 1,000 mg by mouth 4 (four) times a week. AT NIGHT    [provider]  aspirin EC 81 MG tablet Take 81 mg by mouth every other day. In the morning. Swallow whole.    [provider]  benazepril (LOTENSIN) 40 MG tablet TAKE 1 TABLET(40 MG) BY MOUTH DAILY 04/20/21   Paseda, Lillie Columbia R, FNP  Blood Glucose Monitoring Suppl (ACCU-CHEK GUIDE) w/Device KIT 1 each by Does not apply route 4 (four) times daily. 05/08/19   Cassandria Anger, MD  cholecalciferol (VITAMIN D3) 25 MCG (1000 UT) tablet Take 1,000 Units by mouth in the morning.    [provider]  clotrimazole-betamethasone (LOTRISONE) cream Apply twice daily for 1 week, to affected area , then as needed 04/01/20   Fayrene Helper, MD  Continuous Blood Gluc Receiver (FREESTYLE LIBRE 2 READER) DEVI As directed 05/03/21   Cassandria Anger, MD  Continuous Blood Gluc Sensor (FREESTYLE LIBRE 2 SENSOR) MISC 1 Piece by Does not apply  route every 14 (fourteen) days. 05/03/21   Cassandria Anger, MD  ezetimibe (ZETIA) 10 MG tablet Take 1 tablet (10 mg total) by mouth daily. 10/23/20   Almyra Deforest, PA  glipiZIDE (GLUCOTROL XL) 5 MG 24 hr tablet TAKE 1 TABLET(5 MG) BY MOUTH DAILY WITH BREAKFAST 04/21/21   Cassandria Anger, MD  insulin isophane & regular human KwikPen (HUMULIN 70/30 MIX) (70-30) 100 UNIT/ML KwikPen Inject 60 units with breakfast and 40 units with supper only if glucose is above 90 12/31/20   Nida, Marella Chimes, MD  Insulin Pen Needle (B-D ULTRAFINE III SHORT PEN) 31G X 8 MM MISC USE AS  DIRECTED TWICE DAILY WITH INSULIN PENS 02/15/21   Nida, Marella Chimes, MD  latanoprost (XALATAN) 0.005 % ophthalmic solution Place 1 drop into both eyes at bedtime.  02/18/19   [provider]  LINZESS 290 MCG CAPS capsule TAKE 1 CAPSULE(290 MCG) BY MOUTH DAILY BEFORE AND BREAKFAST 04/14/21   Annitta Needs, NP  metFORMIN (GLUCOPHAGE) 500 MG tablet TAKE 1 TABLET BY MOUTH EVERY DAY WITH BREAKFAST 10/26/20   Fayrene Helper, MD  Multiple Vitamin (MULTIVITAMIN WITH MINERALS) TABS tablet Take 1 tablet by mouth daily.    [provider]  Omega-3 Fatty Acids (FISH OIL) 1200 MG CAPS Take 1,200 mg by mouth every morning.    [provider]  pantoprazole (PROTONIX) 40 MG tablet Take 1 tablet (40 mg total) by mouth daily. 30 minutes before meal 01/12/21   Fayrene Helper, MD  polyethylene glycol (MIRALAX) 17 g packet Take 17 g by mouth at bedtime. 05/05/20   Noralyn Pick, NP  pravastatin (PRAVACHOL) 80 MG tablet TAKE 1 TABLET(80 MG) BY MOUTH DAILY 04/20/21   Fayrene Helper, MD  triamterene-hydrochlorothiazide (MAXZIDE) 75-50 MG tablet TAKE 1 TABLET BY MOUTH DAILY 04/20/21 05/20/21  Renee Rival, FNP      Allergies    Senokot wheat bran [wheat bran] and Spironolactone    Review of Systems   Review of Systems  Constitutional:  Negative for chills and fever.  Eyes:  Negative for visual disturbance.  Respiratory:  Negative for shortness of breath.   Cardiovascular:  Negative for chest pain.  Gastrointestinal:  Negative for abdominal pain.  Skin:  Negative for rash.  Neurological:  Positive for weakness, light-headedness and numbness. Negative for dizziness and headaches.  Hematological:  Does not bruise/bleed easily.   Physical Exam Updated Vital Signs BP 134/71 (BP Location: Right Arm)    Pulse 87    Temp 98.3 F (36.8 C) (Oral)    Resp 18    SpO2 98%  Physical Exam Vitals and nursing note reviewed.  Constitutional:      General: She is not in  acute distress.    Appearance: She is not ill-appearing.  HENT:     Head: Normocephalic and atraumatic.     Nose: No congestion.     Mouth/Throat:     Mouth: Mucous membranes are moist.     Pharynx: Oropharynx is clear.  Eyes:     Extraocular Movements: Extraocular movements intact.     Conjunctiva/sclera: Conjunctivae normal.     Pupils: Pupils are equal, round, and reactive to light.  Cardiovascular:     Rate and Rhythm: Normal rate and regular rhythm.     Pulses: Normal pulses.     Heart sounds: No murmur heard.   No friction rub. No gallop.  Pulmonary:     Effort: No respiratory distress.  Breath sounds: No wheezing, rhonchi or rales.  Abdominal:     Palpations: Abdomen is soft.     Tenderness: There is no abdominal tenderness. There is no right CVA tenderness or left CVA tenderness.  Skin:    General: Skin is warm and dry.  Neurological:     Mental Status: She is alert.     GCS: GCS eye subscore is 4. GCS verbal subscore is 5. GCS motor subscore is 6.     Cranial Nerves: No cranial nerve deficit or facial asymmetry.     Sensory: Sensory deficit present.     Motor: No weakness.     Coordination: Romberg sign negative. Finger-Nose-Finger Test normal.     Gait: Gait is intact.     Comments: Cranial nerves II through XII grossly intact, no difficult word finding, so no slurring of words, able follow two-step commands, no unilateral weakness present.  Gait fully intact.  Patient did have some subjective sensation deficits, appears to be more patchy, I decreased sensation on the right side face as well as her right leg but her upper extremities were normal.  Psychiatric:        Mood and Affect: Mood normal.    ED Results / Procedures / Treatments   Labs (all labs ordered are listed, but only abnormal results are displayed) Labs Reviewed  BASIC METABOLIC PANEL - Abnormal; Notable for the following components:      Result Value   Creatinine, Ser 1.13 (*)    GFR,  Estimated 49 (*)    All other components within normal limits  URINALYSIS, ROUTINE W REFLEX MICROSCOPIC - Abnormal; Notable for the following components:   Color, Urine STRAW (*)    All other components within normal limits  RESP PANEL BY RT-PCR (FLU A&B, COVID) ARPGX2  CBC WITH DIFFERENTIAL/PLATELET    EKG EKG Interpretation  Date/Time:  Friday May 21 2021 14:23:26 EST Ventricular Rate:  101 PR Interval:  182 QRS Duration: 92 QT Interval:  356 QTC Calculation: 461 R Axis:   -58 Text Interpretation: Sinus tachycardia Left anterior fascicular block Moderate voltage criteria for LVH, may be normal variant ( R in aVL , Cornell product ) Abnormal ECG When compared with ECG of 24-Mar-2021 04:41, No significant change was found Confirmed by Milton Ferguson [phone]) on 05/21/2021 5:15:10 PM  Radiology MR BRAIN WO CONTRAST  Result Date: 05/21/2021 CLINICAL DATA:  Facial numbness/irritation, concern for transient ischemic attack EXAM: MRI HEAD WITHOUT CONTRAST TECHNIQUE: Multiplanar, multiecho pulse sequences of the brain and surrounding structures were obtained without intravenous contrast. COMPARISON:  02/28/2013 FINDINGS: Brain: No restricted diffusion to suggest acute or subacute infarct. No acute hemorrhage, mass, mass effect, or midline shift. No foci of hemosiderin deposition to suggest remote hemorrhage. T2 hyperintense signal in the periventricular white matter, likely the sequela of mild chronic small vessel ischemic disease. No extra-axial collection or hydrocephalus. Vascular: Normal flow voids. Skull and upper cervical spine: Normal marrow signal. Sinuses/Orbits: Negative.  Status post bilateral lens replacements. Other: None. IMPRESSION: No acute intracranial process. Electronically Signed   By: Merilyn Baba M.D.   On: 05/21/2021 16:25    Procedures Procedures    Medications Ordered in ED Medications - No data to display  ED Course/ Medical Decision Making/ A&P                            Medical Decision Making  This patient presents to the ED for concern  of numbness and weakness, this involves an extensive number of treatment options, and is a complaint that carries with it a high risk of complications and morbidity.  The differential diagnosis includes TIA, CVA, and from metabolic abnormality    Additional history obtained:  Additional history obtained from electronic medical records External records from outside source obtained and reviewed including previous urgent care note   Co morbidities that complicate the patient evaluation  Hypertension, hyperlipidemia, diabetes  Social Determinants of Health:  N/A    Lab Tests:  I Ordered, and personally interpreted labs.  The pertinent results include: CBC is unremarkable, BMP shows creatinine of 113, GFR 49 UA unremarkable   Imaging Studies ordered:  I ordered imaging studies including MRI brain without contrast I independently visualized and interpreted imaging which showed negative for acute findings I agree with the radiologist interpretation   Cardiac Monitoring:  The patient was maintained on a cardiac monitor.  I personally viewed and interpreted the cardiac monitored which showed an underlying rhythm of: EKG sinus tach without signs of ischemia   Test Considered:  CT head but deferred as I have low suspicion for intracranial head bleed and or mass she is not on anticoagulants, there is no recent head trauma, no focal deficit present on exam    Rule out  Low suspicion for CVA she has no focal deficit present my exam.  Low suspicion for dissection of the vertebral or carotid artery as presentation atypical of etiology.  Low suspicion for meningitis as she has no meningeal sign present.  I have low suspicion for TIA as presentation is atypical, nonhypertensive, has subjective weakness in the lower extremities bilaterally, and patchy paresthesias in the right face as well as right lower  extremity, MRI is also negative for any acute findings.  Low suspicion for metabolic abnormality as her lab work is unremarkable.     Dispostion and problem list  After consideration of the diagnostic results and the patients response to treatment, I feel that the patent would benefit from   Paresthesias, weakness resolved-unclear etiology pulses from deep inhalation of sulfur, we will have her follow-up with her PCP for further evaluation.  Gave strict return precautions..             Final Clinical Impression(s) / ED Diagnoses Final diagnoses:  Numbness    Rx / DC Orders ED Discharge Orders     None         Marcello Fennel, PA-C 05/21/21 1732    Milton Ferguson, MD 05/23/21 1000

## 2021-05-21 NOTE — ED Triage Notes (Signed)
Pt reports nasal congestion, facial burning and nose burning after was in closed contact with sulfur around 10:30-11:00 a today. States roof of mouth feel sore. Denies throat closing, difficulty talking, swallowing, breathing.

## 2021-05-21 NOTE — Discharge Instructions (Signed)
Lab work imaging all reassuring.  Please continue with all home medications.  Follow-up with PCP as needed.  Come back to the emergency department if you develop chest pain, shortness of breath, severe abdominal pain, uncontrolled nausea, vomiting, diarrhea.

## 2021-05-29 DIAGNOSIS — Z23 Encounter for immunization: Secondary | ICD-10-CM | POA: Diagnosis not present

## 2021-06-04 ENCOUNTER — Encounter: Payer: Self-pay | Admitting: Podiatry

## 2021-06-04 ENCOUNTER — Ambulatory Visit (INDEPENDENT_AMBULATORY_CARE_PROVIDER_SITE_OTHER): Payer: Medicare Other | Admitting: Podiatry

## 2021-06-04 ENCOUNTER — Other Ambulatory Visit: Payer: Self-pay

## 2021-06-04 DIAGNOSIS — M79675 Pain in left toe(s): Secondary | ICD-10-CM

## 2021-06-04 DIAGNOSIS — E1122 Type 2 diabetes mellitus with diabetic chronic kidney disease: Secondary | ICD-10-CM | POA: Diagnosis not present

## 2021-06-04 DIAGNOSIS — Z794 Long term (current) use of insulin: Secondary | ICD-10-CM

## 2021-06-04 DIAGNOSIS — N181 Chronic kidney disease, stage 1: Secondary | ICD-10-CM

## 2021-06-04 DIAGNOSIS — B351 Tinea unguium: Secondary | ICD-10-CM

## 2021-06-04 DIAGNOSIS — M79674 Pain in right toe(s): Secondary | ICD-10-CM | POA: Diagnosis not present

## 2021-06-05 DIAGNOSIS — Z20828 Contact with and (suspected) exposure to other viral communicable diseases: Secondary | ICD-10-CM | POA: Diagnosis not present

## 2021-06-08 DIAGNOSIS — H401131 Primary open-angle glaucoma, bilateral, mild stage: Secondary | ICD-10-CM | POA: Diagnosis not present

## 2021-06-10 DIAGNOSIS — Z20822 Contact with and (suspected) exposure to covid-19: Secondary | ICD-10-CM | POA: Diagnosis not present

## 2021-06-11 NOTE — Progress Notes (Signed)
°  Subjective:  Patient ID: Joanna Brown, female    DOB: 1940-12-13,  MRN: [id number]  81 y.o. female presents at risk foot care. Pt has h/o NIDDM with chronic kidney disease and painful elongated mycotic toenails 1-5 bilaterally which are tender when wearing enclosed shoe gear. Pain is relieved with periodic professional debridement.  Patient states blood glucose was 91 mg/dl today.   PCP is Fayrene Helper, MD , and last visit was 03/02/2021.  Allergies  Allergen Reactions   Senokot Wheat Bran [Wheat Bran]     ABDOMINAL CRAMPS   Spironolactone     Stomach problems, vision changes     Review of Systems: Negative except as noted in the HPI.   Objective:  Vascular Examination: Vascular status intact b/l with palpable pedal pulses. CFT immediate b/l. No edema. No pain with calf compression b/l. Skin temperature gradient WNL b/l. No ischemia or gangrene noted b/l LE. No cyanosis or clubbing noted b/l LE.  Neurological Examination: Sensation grossly intact b/l with 10 gram monofilament. Vibratory sensation intact b/l. Protective sensation intact 5/5 intact bilaterally with 10g monofilament b/l.  Dermatological Examination: Pedal skin with normal turgor, texture and tone b/l. Toenails 1-5 b/l thick, discolored, elongated with subungual debris and pain on dorsal palpation. No hyperkeratotic lesions noted b/l.   Musculoskeletal Examination: Muscle strength 5/5 to b/l LE. HAV with bunion deformity noted b/l LE. Patient ambulates independent of any assistive aids.  Radiographs: None  Last A1c:   Hemoglobin A1C Latest Ref Rng & Units 03/22/2021 12/31/2020 09/08/2020  HGBA1C 4.8 - 5.6 % 7.4(H) 7.8(A) 8.4(A)  Some recent data might be hidden   Assessment:   1. Pain due to onychomycosis of toenails of both feet   2. Type 2 diabetes mellitus with stage 1 chronic kidney disease, with long-term current use of insulin ([place])     Plan:  -No new findings. No new orders. -Mycotic toenails  1-5 bilaterally were debrided in length and girth with sterile nail nippers and dremel without incident. -Patient/POA to call should there be question/concern in the interim.  Return in about 3 months (around 09/02/2021).  Marzetta Board, DPM

## 2021-06-18 ENCOUNTER — Telehealth: Payer: Medicare Other | Admitting: Family Medicine

## 2021-06-18 NOTE — Telephone Encounter (Signed)
Pt states no to the FLU AND [facility]   What kind of shot does the pt need, per the pt she is stating Hep B?

## 2021-06-23 NOTE — Telephone Encounter (Signed)
States her cardiologist recommended she have the hepatitis B vaccine series and she went to [facility] and they told her that her insurance wouldn't cover it if she didn't get it at a drs office but we don't have that vaccine. I was told we didn't give those here because we didn't get reimbursed. What do I tell this patient?

## 2021-06-29 ENCOUNTER — Other Ambulatory Visit: Payer: Self-pay | Admitting: Family Medicine

## 2021-07-05 NOTE — Telephone Encounter (Signed)
Z23

## 2021-07-08 ENCOUNTER — Telehealth: Payer: Self-pay | Admitting: Family Medicine

## 2021-07-08 NOTE — Telephone Encounter (Signed)
Pt returning phone call ° °

## 2021-07-08 NOTE — Telephone Encounter (Signed)
Left message that she could come here to get the hep a/b vaccine

## 2021-07-08 NOTE — Telephone Encounter (Signed)
LVM that she can schedule a time to come in and get the Hepatitis A/B vaccine. Nurse visit

## 2021-07-14 ENCOUNTER — Other Ambulatory Visit: Payer: Self-pay | Admitting: "Endocrinology

## 2021-07-21 DIAGNOSIS — Z20822 Contact with and (suspected) exposure to covid-19: Secondary | ICD-10-CM | POA: Diagnosis not present

## 2021-07-23 DIAGNOSIS — Z20828 Contact with and (suspected) exposure to other viral communicable diseases: Secondary | ICD-10-CM | POA: Diagnosis not present

## 2021-08-02 ENCOUNTER — Other Ambulatory Visit: Payer: Self-pay

## 2021-08-02 ENCOUNTER — Ambulatory Visit (INDEPENDENT_AMBULATORY_CARE_PROVIDER_SITE_OTHER): Payer: Medicare Other | Admitting: Family Medicine

## 2021-08-02 DIAGNOSIS — Z0000 Encounter for general adult medical examination without abnormal findings: Secondary | ICD-10-CM | POA: Diagnosis not present

## 2021-08-02 NOTE — Progress Notes (Signed)
? ?Subjective:  ? Joanna Brown is a 81 y.o. female who presents for Medicare Annual (Subsequent) preventive examination. ? ?Review of Systems    ? ?Cardiac Risk Factors include: advanced age (>11mn, >>40women);diabetes mellitus;obesity (BMI >30kg/m2);hypertension ? ?   ?Objective:  ?  ?There were no vitals filed for this visit. ?There is no height or weight on file to calculate BMI. ? ?Advanced Directives 08/02/2021 05/21/2021 03/23/2021 03/22/2021 03/22/2021 10/02/2020 07/29/2020  ?Does Patient Have a Medical Advance Directive? No No Yes Yes Yes Yes Yes  ?Type of Advance Directive - - Living will Living will Living will Living will Healthcare Power of Attorney  ?Does patient want to make changes to medical advance directive? - - No - Patient declined - No - Patient declined No - Patient declined -  ?[place]? - - - - - - No - copy requested  ?Would patient like information on creating a medical advance directive? No - Patient declined No - Patient declined - - - - No - Patient declined  ?Pre-existing out of facility DNR order (yellow form or pink MOST form) - - - - - - -  ? ? ?Current Medications (verified) ?Outpatient Encounter Medications as of 08/02/2021  ?Medication Sig  ? Accu-Chek FastClix Lancets MISC 1 each by Other route 2 (two) times daily.  ? ACCU-CHEK GUIDE test strip USE TO CHECK BLOOD SUGAR TWICE DAILY  ? acetaminophen (TYLENOL) 500 MG tablet Take 500-1,000 mg by mouth every 6 (six) hours as needed (pain).  ? ALPHAGAN P 0.1 % SOLN Place 1 drop into both eyes 2 (two) times daily.  ? amLODipine (NORVASC) 10 MG tablet TAKE 1 TABLET(10 MG) BY MOUTH EVERY MORNING  ? amoxicillin (AMOXIL) 500 MG capsule Take 4 capsules (2,000 mg total) by mouth as needed (1 hour prior to dental work).  ? Ascorbic Acid (VITAMIN C) 1000 MG tablet Take 1,000 mg by mouth 4 (four) times a week. AT NIGHT  ? aspirin EC 81 MG tablet Take 81 mg by mouth every other day. In the morning. Swallow whole.   ? benazepril (LOTENSIN) 40 MG tablet TAKE 1 TABLET(40 MG) BY MOUTH DAILY  ? Blood Glucose Monitoring Suppl (ACCU-CHEK GUIDE) w/Device KIT 1 each by Does not apply route 4 (four) times daily.  ? cholecalciferol (VITAMIN D3) 25 MCG (1000 UT) tablet Take 1,000 Units by mouth in the morning.  ? clotrimazole-betamethasone (LOTRISONE) cream Apply twice daily for 1 week, to affected area , then as needed  ? Continuous Blood Gluc Receiver (FREESTYLE LIBRE 2 READER) DEVI As directed  ? Continuous Blood Gluc Sensor (FREESTYLE LIBRE 2 SENSOR) MISC 1 Piece by Does not apply route every 14 (fourteen) days.  ? ezetimibe (ZETIA) 10 MG tablet Take 1 tablet (10 mg total) by mouth daily.  ? glipiZIDE (GLUCOTROL XL) 5 MG 24 hr tablet TAKE 1 TABLET(5 MG) BY MOUTH DAILY WITH BREAKFAST  ? insulin isophane & regular human KwikPen (HUMULIN 70/30 MIX) (70-30) 100 UNIT/ML KwikPen Inject 60 units with breakfast and 40 units with supper only if glucose is above 90  ? Insulin Pen Needle (B-D ULTRAFINE III SHORT PEN) 31G X 8 MM MISC USE AS DIRECTED TWICE DAILY WITH INSULIN PENS  ? latanoprost (XALATAN) 0.005 % ophthalmic solution Place 1 drop into both eyes at bedtime.   ? LINZESS 290 MCG CAPS capsule TAKE 1 CAPSULE(290 MCG) BY MOUTH DAILY BEFORE AND BREAKFAST  ? metFORMIN (GLUCOPHAGE) 500 MG tablet TAKE  1 TABLET BY MOUTH EVERY DAY WITH BREAKFAST  ? Multiple Vitamin (MULTIVITAMIN WITH MINERALS) TABS tablet Take 1 tablet by mouth daily.  ? Omega-3 Fatty Acids (FISH OIL) 1200 MG CAPS Take 1,200 mg by mouth every morning.  ? pantoprazole (PROTONIX) 40 MG tablet Take 1 tablet (40 mg total) by mouth daily. 30 minutes before meal  ? polyethylene glycol (MIRALAX) 17 g packet Take 17 g by mouth at bedtime.  ? pravastatin (PRAVACHOL) 80 MG tablet TAKE 1 TABLET(80 MG) BY MOUTH DAILY  ? triamterene-hydrochlorothiazide (MAXZIDE) 75-50 MG tablet TAKE 1 TABLET BY MOUTH DAILY  ? ?No facility-administered encounter medications on file as of 08/02/2021.   ? ? ?Allergies (verified) ?Senokot wheat bran [wheat bran] and Spironolactone  ? ?History: ?Past Medical History:  ?Diagnosis Date  ? Anginal pain ([place])   ? Arthritis   ? OA  ? Diabetes mellitus   ? Dyspnea   ? Female bladder prolapse   ? GERD (gastroesophageal reflux disease)   ? Glaucoma   ? Hyperlipidemia   ? Hypertension   ? echo and stress 4/10 reports on chart, EKG ` LOV 9/12 on chart  ? S/P TAVR (transcatheter aortic valve replacement) 03/23/2021  ? Edwards 31m S3U TF approach with Dr. MAngelena Formand Dr. BCyndia Bent ? Severe aortic stenosis   ? Thyroid disease   ? ?Past Surgical History:  ?Procedure Laterality Date  ? ABDOMINAL HYSTERECTOMY    ? ANTERIOR AND POSTERIOR REPAIR  04/26/2011  ? Procedure: ANTERIOR (CYSTOCELE) AND POSTERIOR REPAIR (RECTOCELE);  Surgeon: SReece Packer MD;  Location: [facility];  Service: Urology;  Laterality: N/A;  ? BIOPSY  05/25/2020  ? Procedure: BIOPSY;  Surgeon: DDoran Stabler MD;  [facility];  Service: Gastroenterology;;  ? CATARACT EXTRACTION Bilateral   ? with IOL  ? CHOLECYSTECTOMY  2009  ? COLONOSCOPY N/A 12/02/2013  ? three colon polyps removed, small internal hemorrhoids. Hyperplastic polyps  ? COLONOSCOPY N/A 11/20/2019  ? pancolonic diverticulosis, two 10-11 mm polyps in ascending colon, one 5 mm polyp in cecum, ascending colon with superficially invasive adenocarcinoma arising in background of sessile serrated polyps with low and high grade cytologic dysplasia.  ? COLONOSCOPY    ? COLONOSCOPY W/ POLYPECTOMY    ? COLONOSCOPY WITH PROPOFOL N/A 05/25/2020  ? diverticulosis in right colon, redundant colon. Caution warranted on future colonoscopy in light of age, cardiac condition, challenging anatomy.   ? DILATION AND CURETTAGE OF UTERUS    ? pt states this was in the 1970's  ? ESOPHAGOGASTRODUODENOSCOPY (EGD) WITH PROPOFOL N/A 05/25/2020  ? Grade 1 esophageal varices, single mucosal nodule in stomach s/p biopsy. (hyperplastic)>   ? LEFT HEART CATH   09/10/2008  ? normal coronary arteries, normal LV systolic function, EF 661%(Dr. HNorlene Duel  ? LYMPH NODE DISSECTION Right 1997  ? under arm  ? NM MYOCAR PERF WALL MOTION  2010  ? dipyridamole - mild-mod in intenstiy perfusion defect in mid anterior, mid anteroseptal wall, EF 70%  ? OVARY SURGERY    ? bilateral tumors removed  ? POLYPECTOMY  11/20/2019  ? Procedure: POLYPECTOMY;  Surgeon: RDaneil Dolin MD;  [facility];  Service: Endoscopy;;  hot and cold snare cecal polyp, and asending polyps x 2  ? RIGHT/LEFT HEART CATH AND CORONARY ANGIOGRAPHY N/A 10/02/2020  ? Procedure: RIGHT/LEFT HEART CATH AND CORONARY ANGIOGRAPHY;  Surgeon: JMartinique Peter M, MD;  [facility];  Service: Cardiovascular;  Laterality: N/A;  ?  THYROIDECTOMY    ? THYROIDECTOMY  02/2008  ? TRANSCATHETER AORTIC VALVE REPLACEMENT, TRANSFEMORAL N/A 03/23/2021  ? Procedure: TRANSCATHETER AORTIC VALVE REPLACEMENT, TRANSFEMORAL;  Surgeon: Burnell Blanks, MD;  Location: [place] [facility];  Service: Open Heart Surgery;  Laterality: N/A;  ? TRANSTHORACIC ECHOCARDIOGRAM  08/2011  ? EF=>55%, mild conc LVH; trace MR; mild TR; mild-mod AV calcification with mild valvular AV stenosis  ? VAGINAL PROLAPSE REPAIR  04/26/2011  ? Procedure: VAGINAL VAULT SUSPENSION;  Surgeon: Reece Packer, MD;  Location: [facility];  Service: Urology;  Laterality: N/A;  with Graft  10x6  ? ?Family History  ?Problem Relation Age of Onset  ? Stomach cancer Mother 40  ? Heart disease Mother 71  ?     heart disease  ? Heart disease Father 38  ?     MI  ? Stroke Maternal Grandfather   ? Heart attack Paternal Grandfather   ? Hypertension Brother   ? Bone cancer Brother   ? Hypertension Sister   ? Liver cancer Sister   ? Hypertension Sister   ? Hypertension Child   ? Colon cancer Neg Hx   ? Colon polyps Neg Hx   ? Pancreatic cancer Neg Hx   ? Esophageal cancer Neg Hx   ? Rectal cancer Neg Hx   ? ?Social History  ? ?Socioeconomic History  ? Marital  status: Married  ?  Spouse name: Delfino Lovett  ? Number of children: 1  ? Years of education: Trade  ? Highest education level: 12th grade  ?Occupational History  ? Occupation: Retired  ? Occupation: Writer

## 2021-08-02 NOTE — Patient Instructions (Signed)
?  Joanna Brown , ?Thank you for taking time to come for your Medicare Wellness Visit. I appreciate your ongoing commitment to your health goals. Please review the following plan we discussed and let me know if I can assist you in the future.  ? ?These are the goals we discussed: ? Goals   ? ?  DIET - INCREASE WATER INTAKE   ?  HEMOGLOBIN A1C < 7.0   ?  A1C 8.7 07/05/2016   ?Will continue see nutritionist  ?Will work with pcp to manage medications  ?  ? ?  ?  ?This is a list of the screening recommended for you and due dates:  ?Health Maintenance  ?Topic Date Due  ? Zoster (Shingles) Vaccine (1 of 2) Never done  ? Tetanus Vaccine  10/04/2020  ? COVID-19 Vaccine (4 - Booster for Moderna series) 01/14/2021  ? Complete foot exam   06/24/2021  ? Flu Shot  08/13/2021*  ? Hemoglobin A1C  09/19/2021  ? Eye exam for diabetics  03/09/2022  ? Pneumonia Vaccine  Completed  ? DEXA scan (bone density measurement)  Completed  ? Hepatitis C Screening: USPSTF Recommendation to screen - Ages 7-79 yo.  Completed  ? HPV Vaccine  Aged Out  ?*Topic was postponed. The date shown is not the original due date.  ?  ?

## 2021-08-03 ENCOUNTER — Ambulatory Visit (INDEPENDENT_AMBULATORY_CARE_PROVIDER_SITE_OTHER): Payer: Medicare Other | Admitting: Family Medicine

## 2021-08-03 ENCOUNTER — Encounter: Payer: Self-pay | Admitting: Family Medicine

## 2021-08-03 ENCOUNTER — Other Ambulatory Visit: Payer: Self-pay

## 2021-08-03 VITALS — BP 130/82 | HR 89 | Ht 65.0 in | Wt 187.1 lb

## 2021-08-03 DIAGNOSIS — N181 Chronic kidney disease, stage 1: Secondary | ICD-10-CM | POA: Diagnosis not present

## 2021-08-03 DIAGNOSIS — E559 Vitamin D deficiency, unspecified: Secondary | ICD-10-CM | POA: Diagnosis not present

## 2021-08-03 DIAGNOSIS — I739 Peripheral vascular disease, unspecified: Secondary | ICD-10-CM | POA: Diagnosis not present

## 2021-08-03 DIAGNOSIS — E663 Overweight: Secondary | ICD-10-CM | POA: Diagnosis not present

## 2021-08-03 DIAGNOSIS — I350 Nonrheumatic aortic (valve) stenosis: Secondary | ICD-10-CM | POA: Diagnosis not present

## 2021-08-03 DIAGNOSIS — E1122 Type 2 diabetes mellitus with diabetic chronic kidney disease: Secondary | ICD-10-CM

## 2021-08-03 DIAGNOSIS — Z1231 Encounter for screening mammogram for malignant neoplasm of breast: Secondary | ICD-10-CM

## 2021-08-03 DIAGNOSIS — K219 Gastro-esophageal reflux disease without esophagitis: Secondary | ICD-10-CM | POA: Diagnosis not present

## 2021-08-03 DIAGNOSIS — Z794 Long term (current) use of insulin: Secondary | ICD-10-CM

## 2021-08-03 DIAGNOSIS — I10 Essential (primary) hypertension: Secondary | ICD-10-CM | POA: Diagnosis not present

## 2021-08-03 NOTE — Assessment & Plan Note (Signed)
Controlled, no change in medication  

## 2021-08-03 NOTE — Assessment & Plan Note (Signed)
Controlled, no change in medication ?DASH diet and commitment to daily physical activity for a minimum of 30 minutes discussed and encouraged, as a part of hypertension management. ?The importance of attaining a healthy weight is also discussed. ? ?BP/Weight 08/03/2021 05/21/2021 05/21/2021 05/03/2021 04/26/2021 03/31/2021 03/24/2021  ?Systolic BP 056 979 480 165 134 126 130  ?Diastolic BP 82 71 79 62 78 78 55  ?Wt. (Lbs) 187.08 - - 185.8 184 180 182.7  ?BMI 36.54 - - 30.92 30.62 29.95 -  ? ? ? ? ?

## 2021-08-03 NOTE — Patient Instructions (Addendum)
F/u in early September, flu vaccine at visit ? ?May nurse vist for Hep B Vaccine, nurse pls verify this at checkout from record ? ?Please schedule mammogram at checkout ? ?Lipid, vit D and microalb today ? ?Call for referral if you decide to have capal tunnel addressed ? ?It is important that you exercise regularly at least 30 minutes 5 times a week. If you develop chest pain, have severe difficulty breathing, or feel very tired, stop exercising immediately and seek medical attention  ? ?Thanks for choosing [facility], we consider it a privelige to serve you. ? ?

## 2021-08-03 NOTE — Assessment & Plan Note (Signed)
Joanna Brown is reminded of the importance of commitment to daily physical activity for 30 minutes or more, as able and the need to limit carbohydrate intake to 30 to 60 grams per meal to help with blood sugar control.  ? ?The need to take medication as prescribed, test blood sugar as directed, and to call between visits if there is a concern that blood sugar is uncontrolled is also discussed.  ? ?Joanna Brown is reminded of the importance of daily foot exam, annual eye examination, and good blood sugar, blood pressure and cholesterol control. ? ?Diabetic Labs Latest Ref Rng & Units 05/21/2021 04/28/2021 03/24/2021 03/23/2021 03/23/2021  ?HbA1c 4.8 - 5.6 % - - - - -  ?Microalbumin mg/dL - - - - -  ?Micro/Creat Ratio <30 mcg/mg creat - - - - -  ?Chol 100 - 199 mg/dL - 139 - - -  ?HDL >39 mg/dL - 40 - - -  ?Calc LDL 0 - 99 mg/dL - 64 - - -  ?Triglycerides 0 - 149 mg/dL - 215(H) - - -  ?Creatinine 0.44 - 1.00 mg/dL 1.13(H) 1.13(H) 1.09(H) 1.00 1.00  ?GFR >60.00 mL/min - - - - -  ? ?BP/Weight 08/03/2021 05/21/2021 05/21/2021 05/03/2021 04/26/2021 03/31/2021 03/24/2021  ?Systolic BP 370 488 891 694 134 126 130  ?Diastolic BP 82 71 79 62 78 78 55  ?Wt. (Lbs) 187.08 - - 185.8 184 180 182.7  ?BMI 31.13 - - 30.92 30.62 29.95 -  ? ?Foot/eye exam completion dates Latest Ref Rng & Units 08/03/2021 03/09/2021  ?Eye Exam No Retinopathy - No Retinopathy  ?Foot Form Completion - Done -  ? ?Managed by Endo ? ? ? ?

## 2021-08-03 NOTE — Assessment & Plan Note (Addendum)
? ?  Patient re-educated about  the importance of commitment to a  minimum of 150 minutes of exercise per week as able. ? ?The importance of healthy food choices with portion control discussed, as well as eating regularly and within a 12 hour window most days. ?The need to choose "clean , green" food 50 to 75% of the time is discussed, as well as to make water the primary drink and set a goal of 64 ounces water daily. ? ?  ?Weight /BMI 08/03/2021 05/03/2021 04/26/2021  ?WEIGHT 187 lb 1.3 oz 185 lb 12.8 oz 184 lb  ?HEIGHT '5\' 5"'$  '5\' 5"'$  '5\' 5"'$   ?BMI 31.13 kg/m2 30.92 kg/m2 30.62 kg/m2  ? ? ? ? ? ?

## 2021-08-03 NOTE — Assessment & Plan Note (Addendum)
Decreased DP and reports claudication refer vascular ?

## 2021-08-03 NOTE — Progress Notes (Signed)
? ?Joanna Brown     MRN: [id number]      DOB: 1940-10-29 ? ? ?HPI ?Joanna Brown is here for follow up and re-evaluation of chronic medical conditions, medication management and review of any available recent lab and radiology data.  ?Preventive health is updated, specifically  Cancer screening and Immunization.   ?Had aortic valve replacement in 03/2021, recovered well, reports some improvement in energy level ?The PT denies any adverse reactions to current medications since the last visit.  ?1 month h/o increased pain, numbness, tingling tremor in left more than right hand, uses a splint at times, does not waken her , notes weakness, difficulty writing and opening cans, waiting on further eval at this time  ? ?ROS ?Denies recent fever or chills. ?Denies sinus pressure, nasal congestion, ear pain or sore throat. ?Denies chest congestion, productive cough or wheezing. ?Denies chest pains, palpitations and leg swelling ?Denies abdominal pain, nausea, vomiting,diarrhea or constipation.   ?Denies dysuria, frequency, hesitancy or incontinence. ? ?Denies depression, anxiety or insomnia. ?Denies skin break down or rash. ? ? ?PE ? ?BP 130/82   Pulse 89   Ht '5\' 5"'$  (1.651 m)   Wt 187 lb 1.3 oz (84.9 kg)   SpO2 98%   BMI 31.13 kg/m?  ? ? ? ?Patient alert and oriented and in no cardiopulmonary distress. ? ?HEENT: No facial asymmetry, EOMI,     Neck supple . ? ?Chest: Clear to auscultation bilaterally. ? ?CVS: S1, S2 systolic  murmur, no S3.Regular rate. ? ?ABD: Soft non tender.  ? ?Ext: No edema ? ?MS: Adequate ROM spine, shoulders, hips and knees. ? ?Skin: Intact, no ulcerations or rash noted. ? ?Psych: Good eye contact, normal affect. Memory intact not anxious or depressed appearing. ? ?CNS: CN 2-12 intact, power,  normal throughout.no focal deficits noted. ? ? ?Assessment & Plan ? ?PVD (peripheral vascular disease) ([place]) ?Decreased DP and reports claudication refer vascular ? ?Essential hypertension ?Controlled, no  change in medication ?DASH diet and commitment to daily physical activity for a minimum of 30 minutes discussed and encouraged, as a part of hypertension management. ?The importance of attaining a healthy weight is also discussed. ? ?BP/Weight 08/03/2021 05/21/2021 05/21/2021 05/03/2021 04/26/2021 03/31/2021 03/24/2021  ?Systolic BP 734 287 681 157 134 126 130  ?Diastolic BP 82 71 79 62 78 78 55  ?Wt. (Lbs) 187.08 - - 185.8 184 180 182.7  ?BMI 36.54 - - 30.92 30.62 29.95 -  ? ? ? ? ? ?Overweight (BMI 25.0-29.9) ? ? ?Patient re-educated about  the importance of commitment to a  minimum of 150 minutes of exercise per week as able. ? ?The importance of healthy food choices with portion control discussed, as well as eating regularly and within a 12 hour window most days. ?The need to choose "clean , green" food 50 to 75% of the time is discussed, as well as to make water the primary drink and set a goal of 64 ounces water daily. ? ?  ?Weight /BMI 08/03/2021 05/03/2021 04/26/2021  ?WEIGHT 187 lb 1.3 oz 185 lb 12.8 oz 184 lb  ?HEIGHT '5\' 5"'$  '5\' 5"'$  '5\' 5"'$   ?BMI 31.13 kg/m2 30.92 kg/m2 30.62 kg/m2  ? ? ? ? ? ? ?GERD (gastroesophageal reflux disease) ?Controlled, no change in medication ? ? ?Type 2 diabetes mellitus with stage 1 chronic kidney disease, with long-term current use of insulin ([place]) ?Joanna Brown is reminded of the importance of commitment to daily physical activity for 30 minutes or more,  as able and the need to limit carbohydrate intake to 30 to 60 grams per meal to help with blood sugar control.  ? ?The need to take medication as prescribed, test blood sugar as directed, and to call between visits if there is a concern that blood sugar is uncontrolled is also discussed.  ? ?Joanna Brown is reminded of the importance of daily foot exam, annual eye examination, and good blood sugar, blood pressure and cholesterol control. ? ?Diabetic Labs Latest Ref Rng & Units 05/21/2021 04/28/2021 03/24/2021 03/23/2021 03/23/2021  ?HbA1c 4.8  - 5.6 % - - - - -  ?Microalbumin mg/dL - - - - -  ?Micro/Creat Ratio <30 mcg/mg creat - - - - -  ?Chol 100 - 199 mg/dL - 139 - - -  ?HDL >39 mg/dL - 40 - - -  ?Calc LDL 0 - 99 mg/dL - 64 - - -  ?Triglycerides 0 - 149 mg/dL - 215(H) - - -  ?Creatinine 0.44 - 1.00 mg/dL 1.13(H) 1.13(H) 1.09(H) 1.00 1.00  ?GFR >60.00 mL/min - - - - -  ? ?BP/Weight 08/03/2021 05/21/2021 05/21/2021 05/03/2021 04/26/2021 03/31/2021 03/24/2021  ?Systolic BP 235 361 443 154 134 126 130  ?Diastolic BP 82 71 79 62 78 78 55  ?Wt. (Lbs) 187.08 - - 185.8 184 180 182.7  ?BMI 31.13 - - 30.92 30.62 29.95 -  ? ?Foot/eye exam completion dates Latest Ref Rng & Units 08/03/2021 03/09/2021  ?Eye Exam No Retinopathy - No Retinopathy  ?Foot Form Completion - Done -  ? ?Managed by Endo ? ? ? ? ?

## 2021-08-05 LAB — LIPID PANEL
Chol/HDL Ratio: 3.8 ratio (ref 0.0–4.4)
Cholesterol, Total: 165 mg/dL (ref 100–199)
HDL: 44 mg/dL (ref >39)
LDL Chol Calc (NIH): 90 mg/dL (ref 0–99)
Triglycerides: 184 mg/dL — ABNORMAL HIGH (ref 0–149)
VLDL Cholesterol Cal: 31 mg/dL (ref 5–40)

## 2021-08-05 LAB — MICROALBUMIN / CREATININE URINE RATIO
Creatinine, Urine: 38.6 mg/dL
Microalb/Creat Ratio: 61 mg/g creat — ABNORMAL HIGH (ref 0–29)
Microalbumin, Urine: 23.6 ug/mL

## 2021-08-05 LAB — VITAMIN D 25 HYDROXY (VIT D DEFICIENCY, FRACTURES): Vit D, 25-Hydroxy: 60.8 ng/mL (ref 30.0–100.0)

## 2021-08-07 DIAGNOSIS — Z20822 Contact with and (suspected) exposure to covid-19: Secondary | ICD-10-CM | POA: Diagnosis not present

## 2021-08-09 DIAGNOSIS — Z20822 Contact with and (suspected) exposure to covid-19: Secondary | ICD-10-CM | POA: Diagnosis not present

## 2021-08-10 ENCOUNTER — Other Ambulatory Visit: Payer: Self-pay

## 2021-08-10 ENCOUNTER — Ambulatory Visit (INDEPENDENT_AMBULATORY_CARE_PROVIDER_SITE_OTHER): Payer: Medicare Other | Admitting: Gastroenterology

## 2021-08-10 VITALS — BP 112/60 | HR 78 | Temp 97.5°F | Ht 65.0 in | Wt 186.8 lb

## 2021-08-10 DIAGNOSIS — Z85038 Personal history of other malignant neoplasm of large intestine: Secondary | ICD-10-CM

## 2021-08-10 DIAGNOSIS — K7460 Unspecified cirrhosis of liver: Secondary | ICD-10-CM | POA: Diagnosis not present

## 2021-08-10 DIAGNOSIS — K219 Gastro-esophageal reflux disease without esophagitis: Secondary | ICD-10-CM | POA: Diagnosis not present

## 2021-08-10 MED ORDER — LINACLOTIDE 290 MCG PO CAPS: 290.0000 ug | ORAL_CAPSULE | Freq: Every day | ORAL | 3 refills | Status: DC

## 2021-08-10 NOTE — Progress Notes (Signed)
? ? ?Gastroenterology Office Note   ? ? ?Primary Care Physician:  Fayrene Helper, MD  ?Primary Gastroenterologist: Dr. Abbey Chatters ? ? ?Chief Complaint  ? ?Chief Complaint  ?Patient presents with  ? Follow-up  ? ? ? ?History of Present Illness  ? ?Joanna Brown is a 81 y.o. female presenting today in follow-up with a history of  superficial invasive adenocarcinoma arising in background of a sessile serrated polyp in ascending colon in July 2021. CT chest/abd/pelvis on file from July 2021 and repeat CT Chest/abdomen/pelvis was completed in Nov 2021 for surveillance of nonspecific small right peritoneal soft tissue nodule inferior to right liver lobe, and tiny left lower lobe pulmonary nodule. Subtle changes suggestive of cirrhosis with normal spleen. No thrombocytopenia. Repeat CT chest/abd/pelvis with stable 4 mm nodularity along right upper quadrant omentum, no progressive nodulartiy, probably present since 2012. CEA was 2.4 in July 2021. Hep C screening 2014 negative. Hep A and B negative. Underwent early interval surveillance Jan 2022 with diverticulosis in right colon, redundant colon. Caution warranted for colonoscopy in future due to age and cardiac condition. She was not a candidate for anesthesia at [facility] and underwent colonoscopy by Dr. Loletha Carrow in [place]. EGD also performed with Grade 1 esophageal varices.  ? ? ?She would like to do ultrasound once a year instead of every 6 months. Declining further endoscopies.  ? ? ?Constipation: Linzess 290 mcg daily. Doing well.  ? ?Left side bulging but not painful. Felt burning in upper abdomen. Every now and then. Wondered if something she ate. Hasn't been taking Protonix daily. Forgets to take it. Getting Hep A/B vaccinations through PCP.  ? ? ?Past Medical History:  ?Diagnosis Date  ? Anginal pain ([place])   ? Arthritis   ? OA  ? Diabetes mellitus   ? Dyspnea   ? Female bladder prolapse   ? GERD (gastroesophageal reflux disease)   ? Glaucoma   ?  Hyperlipidemia   ? Hypertension   ? echo and stress 4/10 reports on chart, EKG ` LOV 9/12 on chart  ? S/P TAVR (transcatheter aortic valve replacement) 03/23/2021  ? Edwards 5m S3U TF approach with Dr. MAngelena Formand Dr. BCyndia Bent ? Severe aortic stenosis   ? Thyroid disease   ? ? ?Past Surgical History:  ?Procedure Laterality Date  ? ABDOMINAL HYSTERECTOMY    ? ANTERIOR AND POSTERIOR REPAIR  04/26/2011  ? Procedure: ANTERIOR (CYSTOCELE) AND POSTERIOR REPAIR (RECTOCELE);  Surgeon: SReece Packer MD;  Location: [facility];  Service: Urology;  Laterality: N/A;  ? BIOPSY  05/25/2020  ? Procedure: BIOPSY;  Surgeon: DDoran Stabler MD;  [facility];  Service: Gastroenterology;;  ? CATARACT EXTRACTION Bilateral   ? with IOL  ? CHOLECYSTECTOMY  2009  ? COLONOSCOPY N/A 12/02/2013  ? three colon polyps removed, small internal hemorrhoids. Hyperplastic polyps  ? COLONOSCOPY N/A 11/20/2019  ? pancolonic diverticulosis, two 10-11 mm polyps in ascending colon, one 5 mm polyp in cecum, ascending colon with superficially invasive adenocarcinoma arising in background of sessile serrated polyps with low and high grade cytologic dysplasia.  ? COLONOSCOPY    ? COLONOSCOPY W/ POLYPECTOMY    ? COLONOSCOPY WITH PROPOFOL N/A 05/25/2020  ? diverticulosis in right colon, redundant colon. Caution warranted on future colonoscopy in light of age, cardiac condition, challenging anatomy.   ? DILATION AND CURETTAGE OF UTERUS    ? pt states this was in the 1970's  ? ESOPHAGOGASTRODUODENOSCOPY (EGD) WITH PROPOFOL N/A 05/25/2020  ?  Grade 1 esophageal varices, single mucosal nodule in stomach s/p biopsy. (hyperplastic)>   ? LEFT HEART CATH  09/10/2008  ? normal coronary arteries, normal LV systolic function, EF 44% (Dr. [name])  ? LYMPH NODE DISSECTION Right 1997  ? under arm  ? NM MYOCAR PERF WALL MOTION  2010  ? dipyridamole - mild-mod in intenstiy perfusion defect in mid anterior, mid anteroseptal wall, EF 70%  ? OVARY SURGERY     ? bilateral tumors removed  ? POLYPECTOMY  11/20/2019  ? Procedure: POLYPECTOMY;  Surgeon: Daneil Dolin, MD;  [facility];  Service: Endoscopy;;  hot and cold snare cecal polyp, and asending polyps x 2  ? RIGHT/LEFT HEART CATH AND CORONARY ANGIOGRAPHY N/A 10/02/2020  ? Procedure: RIGHT/LEFT HEART CATH AND CORONARY ANGIOGRAPHY;  Surgeon: Martinique, Peter M, MD;  Location: [place] [facility];  Service: Cardiovascular;  Laterality: N/A;  ? THYROIDECTOMY    ? THYROIDECTOMY  02/2008  ? TRANSCATHETER AORTIC VALVE REPLACEMENT, TRANSFEMORAL N/A 03/23/2021  ? Procedure: TRANSCATHETER AORTIC VALVE REPLACEMENT, TRANSFEMORAL;  Surgeon: Burnell Blanks, MD;  Location: [place] [facility];  Service: Open Heart Surgery;  Laterality: N/A;  ? TRANSTHORACIC ECHOCARDIOGRAM  08/2011  ? EF=>55%, mild conc LVH; trace MR; mild TR; mild-mod AV calcification with mild valvular AV stenosis  ? VAGINAL PROLAPSE REPAIR  04/26/2011  ? Procedure: VAGINAL VAULT SUSPENSION;  Surgeon: Reece Packer, MD;  Location: [facility];  Service: Urology;  Laterality: N/A;  with Graft  10x6  ? ? ?Current Outpatient Medications  ?Medication Sig Dispense Refill  ? ACCU-CHEK GUIDE test strip USE TO CHECK BLOOD SUGAR TWICE DAILY 150 strip 1  ? acetaminophen (TYLENOL) 500 MG tablet Take 500-1,000 mg by mouth every 6 (six) hours as needed (pain).    ? ALPHAGAN P 0.1 % SOLN Place 1 drop into both eyes 2 (two) times daily.    ? amLODipine (NORVASC) 10 MG tablet TAKE 1 TABLET(10 MG) BY MOUTH EVERY MORNING 90 tablet 3  ? amoxicillin (AMOXIL) 500 MG capsule Take 4 capsules (2,000 mg total) by mouth as needed (1 hour prior to dental work). 12 capsule 2  ? Ascorbic Acid (VITAMIN C) 1000 MG tablet Take 1,000 mg by mouth 4 (four) times a week. AT NIGHT    ? aspirin EC 81 MG tablet Take 81 mg by mouth every other day. In the morning. Swallow whole.    ? benazepril (LOTENSIN) 40 MG tablet TAKE 1 TABLET(40 MG) BY MOUTH DAILY 90 tablet 1  ? Blood Glucose  Monitoring Suppl (ACCU-CHEK GUIDE) w/Device KIT 1 each by Does not apply route 4 (four) times daily. 1 kit 0  ? cholecalciferol (VITAMIN D3) 25 MCG (1000 UT) tablet Take 1,000 Units by mouth in the morning.    ? clotrimazole-betamethasone (LOTRISONE) cream Apply twice daily for 1 week, to affected area , then as needed 45 g 0  ? Continuous Blood Gluc Receiver (FREESTYLE LIBRE 2 READER) DEVI As directed 1 each 0  ? ezetimibe (ZETIA) 10 MG tablet Take 1 tablet (10 mg total) by mouth daily. 90 tablet 3  ? glipiZIDE (GLUCOTROL XL) 5 MG 24 hr tablet TAKE 1 TABLET(5 MG) BY MOUTH DAILY WITH BREAKFAST 90 tablet 0  ? insulin isophane & regular human KwikPen (HUMULIN 70/30 MIX) (70-30) 100 UNIT/ML KwikPen Inject 60 units with breakfast and 40 units with supper only if glucose is above 90 60 mL 3  ? Insulin Pen Needle (B-D ULTRAFINE III SHORT PEN)  31G X 8 MM MISC USE AS DIRECTED TWICE DAILY WITH INSULIN PENS 100 each 5  ? latanoprost (XALATAN) 0.005 % ophthalmic solution Place 1 drop into both eyes at bedtime.     ? LINZESS 290 MCG CAPS capsule TAKE 1 CAPSULE(290 MCG) BY MOUTH DAILY BEFORE AND BREAKFAST 90 capsule 3  ? metFORMIN (GLUCOPHAGE) 500 MG tablet TAKE 1 TABLET BY MOUTH EVERY DAY WITH BREAKFAST 60 tablet 2  ? Multiple Vitamin (MULTIVITAMIN WITH MINERALS) TABS tablet Take 1 tablet by mouth daily.    ? Omega-3 Fatty Acids (FISH OIL) 1200 MG CAPS Take 1,200 mg by mouth every morning.    ? pantoprazole (PROTONIX) 40 MG tablet Take 1 tablet (40 mg total) by mouth daily. 30 minutes before meal 90 tablet 1  ? polyethylene glycol (MIRALAX) 17 g packet Take 17 g by mouth at bedtime. 14 each 0  ? pravastatin (PRAVACHOL) 80 MG tablet TAKE 1 TABLET(80 MG) BY MOUTH DAILY 90 tablet 3  ? Accu-Chek FastClix Lancets MISC 1 each by Other route 2 (two) times daily. 102 each 2  ? Continuous Blood Gluc Sensor (FREESTYLE LIBRE 2 SENSOR) MISC 1 Piece by Does not apply route every 14 (fourteen) days. 2 each 3  ?  triamterene-hydrochlorothiazide (MAXZIDE) 75-50 MG tablet TAKE 1 TABLET BY MOUTH DAILY 90 tablet 2  ? ?No current facility-administered medications for this visit.  ? ? ?Allergies as of 08/10/2021 - Review Complete 08/10/2021  ?Allergen Reaction

## 2021-08-10 NOTE — Patient Instructions (Signed)
Please have blood work done when you are able. ? ?We will schedule an ultrasound in September. ? ?Continue pantoprazole once each morning, 30 minutes before breakfast. ? ?I refilled Linzess for you. ? ?We will see you in 6 months! ? ?I enjoyed seeing you again today! As you know, I value our relationship and want to provide genuine, compassionate, and quality care. I welcome your feedback. If you receive a survey regarding your visit,  I greatly appreciate you taking time to fill this out. See you next time! ? ?Annitta Needs, PhD, ANP-BC ?[facility]  ? ?

## 2021-08-13 DIAGNOSIS — Z20828 Contact with and (suspected) exposure to other viral communicable diseases: Secondary | ICD-10-CM | POA: Diagnosis not present

## 2021-08-13 DIAGNOSIS — Z1152 Encounter for screening for COVID-19: Secondary | ICD-10-CM | POA: Diagnosis not present

## 2021-08-18 DIAGNOSIS — Z20822 Contact with and (suspected) exposure to covid-19: Secondary | ICD-10-CM | POA: Diagnosis not present

## 2021-08-21 ENCOUNTER — Other Ambulatory Visit: Payer: Self-pay

## 2021-08-21 ENCOUNTER — Encounter (HOSPITAL_COMMUNITY): Payer: Self-pay

## 2021-08-21 ENCOUNTER — Emergency Department (HOSPITAL_COMMUNITY)
Admission: EM | Admit: 2021-08-21 | Discharge: 2021-08-22 | Disposition: A | Discharge status: Home / Self Care | Payer: Medicare Other | Attending: Emergency Medicine | Admitting: Emergency Medicine

## 2021-08-21 DIAGNOSIS — R42 Dizziness and giddiness: Secondary | ICD-10-CM | POA: Diagnosis not present

## 2021-08-21 DIAGNOSIS — I10 Essential (primary) hypertension: Secondary | ICD-10-CM | POA: Diagnosis not present

## 2021-08-21 DIAGNOSIS — Z79899 Other long term (current) drug therapy: Secondary | ICD-10-CM | POA: Insufficient documentation

## 2021-08-21 DIAGNOSIS — Z7982 Long term (current) use of aspirin: Secondary | ICD-10-CM | POA: Diagnosis not present

## 2021-08-21 DIAGNOSIS — Z794 Long term (current) use of insulin: Secondary | ICD-10-CM | POA: Insufficient documentation

## 2021-08-21 LAB — BASIC METABOLIC PANEL
Anion gap: 9 (ref 5–15)
BUN: 17 mg/dL (ref 8–23)
CO2: 28 mmol/L (ref 22–32)
Calcium: 9.4 mg/dL (ref 8.9–10.3)
Chloride: 99 mmol/L (ref 98–111)
Creatinine, Ser: 1.1 mg/dL — ABNORMAL HIGH (ref 0.44–1.00)
GFR, Estimated: 51 mL/min — ABNORMAL LOW (ref >60)
Glucose, Bld: 166 mg/dL — ABNORMAL HIGH (ref 70–99)
Potassium: 3.4 mmol/L — ABNORMAL LOW (ref 3.5–5.1)
Sodium: 136 mmol/L (ref 135–145)

## 2021-08-21 LAB — CBC
HCT: 34.1 % — ABNORMAL LOW (ref 36.0–46.0)
Hemoglobin: 11.5 g/dL — ABNORMAL LOW (ref 12.0–15.0)
MCH: 28.4 pg (ref 26.0–34.0)
MCHC: 33.7 g/dL (ref 30.0–36.0)
MCV: 84.2 fL (ref 80.0–100.0)
Platelets: 156 10*3/uL (ref 150–400)
RBC: 4.05 MIL/uL (ref 3.87–5.11)
RDW: 13.4 % (ref 11.5–15.5)
WBC: 6.4 10*3/uL (ref 4.0–10.5)
nRBC: 0 % (ref 0.0–0.2)

## 2021-08-21 LAB — TROPONIN I (HIGH SENSITIVITY): Troponin I (High Sensitivity): 9 ng/L (ref <18)

## 2021-08-21 MED ORDER — LIDOCAINE VISCOUS HCL 2 % MT SOLN
15.0000 mL | Freq: Once | OROMUCOSAL | Status: AC
  Administered 2021-08-21: 15 mL via ORAL
  Filled 2021-08-21: qty 15

## 2021-08-21 MED ORDER — ALUM & MAG HYDROXIDE-SIMETH 200-200-20 MG/5ML PO SUSP
30.0000 mL | Freq: Once | ORAL | Status: AC
Start: 2021-08-21 — End: 2021-08-21
  Administered 2021-08-21: 30 mL via ORAL
  Filled 2021-08-21: qty 30

## 2021-08-21 NOTE — ED Triage Notes (Addendum)
Pt c/o feeling nervous and shaky that started about an hour ago. States she felt a burning sensation in her stomach and chest. She also says she feels week.  ? ?Ambulatory in triage  ?

## 2021-08-21 NOTE — ED Provider Notes (Signed)
?[facility] ?Provider Note ? ? ?CSN: [id number] ?Arrival date & time: 08/21/21  2121 ? ?  ? ?History ? ?Chief Complaint  ?Patient presents with  ? Multiple complaints   ? ? ?ITZAMAR TRAYNOR is a 81 y.o. female. ? ?81 year old female who presents to the ER for an episode of dizziness, chest burning, tremulousness in her extremities.  Patient states that she had some corn beef.  She normally does not eat any salt.  She states that hour or 2 later she started feeling some burning in her stomach and then also felt like her hands and fingers and legs and feet were tingling.  Patient states she checked her blood pressure and was high.  She came here for further evaluation to ensure she was not a stroke.  By time I evaluated patient she feels normal.  She never had any chest pain or shortness of breath.  She had no passing out.  She had no asymmetric symptoms at any point.  Her husband is with her and states that she never had any difficulty speaking or facial abnormalities. ? ? ? ?  ? ?Home Medications ?Prior to Admission medications   ?Medication Sig Start Date End Date Taking? Authorizing Provider  ?ACCU-CHEK GUIDE test strip USE TO CHECK BLOOD SUGAR TWICE DAILY 05/12/21   Cassandria Anger, MD  ?acetaminophen (TYLENOL) 500 MG tablet Take 500-1,000 mg by mouth every 6 (six) hours as needed (pain).    [provider]  ?ALPHAGAN P 0.1 % SOLN Place 1 drop into both eyes 2 (two) times daily. 09/16/19   [provider]  ?amLODipine (NORVASC) 10 MG tablet TAKE 1 TABLET(10 MG) BY MOUTH EVERY MORNING 06/29/21   Fayrene Helper, MD  ?amoxicillin (AMOXIL) 500 MG capsule Take 4 capsules (2,000 mg total) by mouth as needed (1 hour prior to dental work). 03/31/21   Eileen Stanford, PA-C  ?Ascorbic Acid (VITAMIN C) 1000 MG tablet Take 1,000 mg by mouth 4 (four) times a week. AT NIGHT    [provider]  ?aspirin EC 81 MG tablet Take 81 mg by mouth every other day. In the  morning. Swallow whole.    [provider]  ?benazepril (LOTENSIN) 40 MG tablet TAKE 1 TABLET(40 MG) BY MOUTH DAILY 04/20/21   Renee Rival, FNP  ?Blood Glucose Monitoring Suppl (ACCU-CHEK GUIDE) w/Device KIT 1 each by Does not apply route 4 (four) times daily. 05/08/19   Cassandria Anger, MD  ?cholecalciferol (VITAMIN D3) 25 MCG (1000 UT) tablet Take 1,000 Units by mouth in the morning.    [provider]  ?clotrimazole-betamethasone (LOTRISONE) cream Apply twice daily for 1 week, to affected area , then as needed 04/01/20   Fayrene Helper, MD  ?Continuous Blood Gluc Receiver (FREESTYLE LIBRE 2 READER) DEVI As directed 05/03/21   Cassandria Anger, MD  ?ezetimibe (ZETIA) 10 MG tablet Take 1 tablet (10 mg total) by mouth daily. 10/23/20   Almyra Deforest, PA  ?glipiZIDE (GLUCOTROL XL) 5 MG 24 hr tablet TAKE 1 TABLET(5 MG) BY MOUTH DAILY WITH BREAKFAST 07/14/21   Cassandria Anger, MD  ?insulin isophane & regular human KwikPen (HUMULIN 70/30 MIX) (70-30) 100 UNIT/ML KwikPen Inject 60 units with breakfast and 40 units with supper only if glucose is above 90 12/31/20   Nida, Marella Chimes, MD  ?Insulin Pen Needle (B-D ULTRAFINE III SHORT PEN) 31G X 8 MM MISC USE AS DIRECTED TWICE DAILY WITH INSULIN PENS 02/15/21  Cassandria Anger, MD  ?latanoprost (XALATAN) 0.005 % ophthalmic solution Place 1 drop into both eyes at bedtime.  02/18/19   [provider]  ?linaclotide Rolan Lipa) 290 MCG CAPS capsule Take 1 capsule (290 mcg total) by mouth daily before breakfast. 08/10/21   Annitta Needs, NP  ?Rolan Lipa 290 MCG CAPS capsule TAKE 1 CAPSULE(290 MCG) BY MOUTH DAILY BEFORE AND BREAKFAST 04/14/21   Annitta Needs, NP  ?metFORMIN (GLUCOPHAGE) 500 MG tablet TAKE 1 TABLET BY MOUTH EVERY DAY WITH BREAKFAST 10/26/20   Fayrene Helper, MD  ?Multiple Vitamin (MULTIVITAMIN WITH MINERALS) TABS tablet Take 1 tablet by mouth daily.    [provider]  ?Omega-3 Fatty Acids (FISH OIL)  1200 MG CAPS Take 1,200 mg by mouth every morning.    [provider]  ?pantoprazole (PROTONIX) 40 MG tablet Take 1 tablet (40 mg total) by mouth daily. 30 minutes before meal 01/12/21   Fayrene Helper, MD  ?polyethylene glycol (MIRALAX) 17 g packet Take 17 g by mouth at bedtime. 05/05/20   Noralyn Pick, NP  ?pravastatin (PRAVACHOL) 80 MG tablet TAKE 1 TABLET(80 MG) BY MOUTH DAILY 04/20/21   Fayrene Helper, MD  ?triamterene-hydrochlorothiazide (MAXZIDE) 75-50 MG tablet TAKE 1 TABLET BY MOUTH DAILY 04/20/21 05/20/21  Renee Rival, FNP  ?   ? ?Allergies    ?Senokot wheat bran [wheat bran] and Spironolactone   ? ?Review of Systems   ?Review of Systems ? ?Physical Exam ?Updated Vital Signs ?BP (!) 148/74   Pulse 68   Temp 97.8 ?F (36.6 ?C)   Resp 19   Ht _0  (1.651 m)   Wt 81.2 kg   SpO2 100%   BMI 29.79 kg/m?  ?Physical Exam ?Vitals and nursing note reviewed.  ?Constitutional:   ?   Appearance: She is well-developed.  ?HENT:  ?   Head: Normocephalic and atraumatic.  ?   Mouth/Throat:  ?   Mouth: Mucous membranes are moist.  ?   Pharynx: Oropharynx is clear.  ?Eyes:  ?   Pupils: Pupils are equal, round, and reactive to light.  ?Cardiovascular:  ?   Rate and Rhythm: Normal rate and regular rhythm.  ?Pulmonary:  ?   Effort: Pulmonary effort is normal. No respiratory distress.  ?   Breath sounds: No stridor. No wheezing.  ?Abdominal:  ?   General: Abdomen is flat. There is no distension.  ?Musculoskeletal:     ?   General: Normal range of motion.  ?   Cervical back: Normal range of motion.  ?Skin: ?   General: Skin is warm and dry.  ?   Coloration: Skin is not jaundiced or pale.  ?Neurological:  ?   General: No focal deficit present.  ?   Mental Status: She is alert.  ?   Comments: No altered mental status, able to give full seemingly accurate history.  ?Face is symmetric, EOM's intact, pupils equal and reactive, vision intact, tongue and uvula midline without deviation. ?Upper and  Lower extremity motor 5/5, intact pain perception in distal extremities, 2+ reflexes in biceps, patella and achilles tendons. Able to perform finger to nose normal with both hands. Walks without assistance or evident ataxia.  ?  ? ? ?ED Results / Procedures / Treatments   ?Labs ?(all labs ordered are listed, but only abnormal results are displayed) ?Labs Reviewed  ?BASIC METABOLIC PANEL - Abnormal; Notable for the following components:  ?    Result Value  ? Potassium  3.4 (*)   ? Glucose, Bld 166 (*)   ? Creatinine, Ser 1.10 (*)   ? GFR, Estimated 51 (*)   ? All other components within normal limits  ?CBC - Abnormal; Notable for the following components:  ? Hemoglobin 11.5 (*)   ? HCT 34.1 (*)   ? All other components within normal limits  ?TROPONIN I (HIGH SENSITIVITY)  ? ? ?EKG ?EKG Interpretation ? ?Date/Time:  Saturday August 21 2021 21:44:57 EDT ?Ventricular Rate:  69 ?PR Interval:  186 ?QRS Duration: 108 ?QT Interval:  430 ?QTC Calculation: 460 ?R Axis:   -38 ?Text Interpretation: Normal sinus rhythm Left axis deviation Incomplete right bundle branch block Minimal voltage criteria for LVH, may be normal variant ( Cornell product ) Abnormal ECG When compared with ECG of 21-May-2021 14:23, No significant change was found Confirmed by Merrily Pew [phone]) on 08/21/2021 11:12:05 PM ? ?Radiology ?No results found. ? ?Procedures ?Procedures  ? ? ?Medications Ordered in ED ?Medications  ?alum & mag hydroxide-simeth (MAALOX/MYLANTA) 200-200-20 MG/5ML suspension 30 mL (30 mLs Oral Given 08/21/21 2345)  ?  And  ?lidocaine (XYLOCAINE) 2 % viscous mouth solution 15 mL (15 mLs Oral Given 08/21/21 2345)  ? ? ?ED Course/ Medical Decision Making/ A&P ?  ?                        ?Medical Decision Making ?Amount and/or Complexity of Data Reviewed ?Labs: ordered. ? ?Risk ?OTC drugs. ?Prescription drug management. ? ? ?Patient is asymptomatic now she been asymptomatic 1 hour.  Labs are unremarkable.  Blood pressures improved without  intervention.  Suspect it may be [place] given a bit of elevated blood pressure which she was not used to causing her symptoms?  Possible anxiety?  Low suspicion for stroke or other significant neurologic abnormalitie

## 2021-08-21 NOTE — ED Notes (Signed)
Pt ambulated to restroom w/o assistance. Gait steady.  ?

## 2021-08-23 DIAGNOSIS — Z20822 Contact with and (suspected) exposure to covid-19: Secondary | ICD-10-CM | POA: Diagnosis not present

## 2021-08-27 DIAGNOSIS — Z20822 Contact with and (suspected) exposure to covid-19: Secondary | ICD-10-CM | POA: Diagnosis not present

## 2021-08-30 DIAGNOSIS — Z85038 Personal history of other malignant neoplasm of large intestine: Secondary | ICD-10-CM | POA: Diagnosis not present

## 2021-08-31 DIAGNOSIS — Z20822 Contact with and (suspected) exposure to covid-19: Secondary | ICD-10-CM | POA: Diagnosis not present

## 2021-08-31 LAB — CEA: CEA: 2.4 ng/mL (ref 0.0–4.7)

## 2021-09-01 ENCOUNTER — Encounter: Payer: Medicare Other | Attending: "Endocrinology | Admitting: Nutrition

## 2021-09-01 ENCOUNTER — Ambulatory Visit (INDEPENDENT_AMBULATORY_CARE_PROVIDER_SITE_OTHER): Payer: Medicare Other | Admitting: "Endocrinology

## 2021-09-01 ENCOUNTER — Ambulatory Visit (INDEPENDENT_AMBULATORY_CARE_PROVIDER_SITE_OTHER): Payer: Medicare Other | Admitting: Vascular Surgery

## 2021-09-01 ENCOUNTER — Encounter: Payer: Self-pay | Admitting: "Endocrinology

## 2021-09-01 ENCOUNTER — Ambulatory Visit (INDEPENDENT_AMBULATORY_CARE_PROVIDER_SITE_OTHER): Payer: Medicare Other

## 2021-09-01 ENCOUNTER — Encounter: Payer: Self-pay | Admitting: Vascular Surgery

## 2021-09-01 ENCOUNTER — Other Ambulatory Visit (HOSPITAL_COMMUNITY): Payer: Self-pay | Admitting: Vascular Surgery

## 2021-09-01 ENCOUNTER — Encounter: Payer: Self-pay | Admitting: Nutrition

## 2021-09-01 VITALS — BP 118/60 | HR 76 | Ht 65.0 in | Wt 188.2 lb

## 2021-09-01 VITALS — BP 155/72 | HR 73 | Temp 99.9°F | Resp 18 | Wt 189.2 lb

## 2021-09-01 DIAGNOSIS — E1122 Type 2 diabetes mellitus with diabetic chronic kidney disease: Secondary | ICD-10-CM

## 2021-09-01 DIAGNOSIS — M25562 Pain in left knee: Secondary | ICD-10-CM | POA: Diagnosis not present

## 2021-09-01 DIAGNOSIS — I10 Essential (primary) hypertension: Secondary | ICD-10-CM

## 2021-09-01 DIAGNOSIS — N181 Chronic kidney disease, stage 1: Secondary | ICD-10-CM

## 2021-09-01 DIAGNOSIS — I739 Peripheral vascular disease, unspecified: Secondary | ICD-10-CM

## 2021-09-01 DIAGNOSIS — E782 Mixed hyperlipidemia: Secondary | ICD-10-CM

## 2021-09-01 DIAGNOSIS — Z794 Long term (current) use of insulin: Secondary | ICD-10-CM | POA: Diagnosis not present

## 2021-09-01 DIAGNOSIS — M25561 Pain in right knee: Secondary | ICD-10-CM | POA: Diagnosis not present

## 2021-09-01 DIAGNOSIS — Z713 Dietary counseling and surveillance: Secondary | ICD-10-CM | POA: Insufficient documentation

## 2021-09-01 LAB — POCT GLYCOSYLATED HEMOGLOBIN (HGB A1C): HbA1c, POC (controlled diabetic range): 7.1 % — AB (ref 0.0–7.0)

## 2021-09-01 NOTE — Progress Notes (Signed)
?09/01/2021 ?                   ? ?Endocrinology follow-up note ? ? ?Subjective:  ? ? Patient ID: Joanna Brown, female    DOB: 04-03-1941. She is being seen in follow-up for uncontrolled type 2 diabetes, complicated by CKD, hyperlipidemia, hypertension.   ? ? ?PMD:   Fayrene Helper, MD ? ?Past Medical History:  ?Diagnosis Date  ? Anginal pain ([place])   ? Arthritis   ? OA  ? Diabetes mellitus   ? Dyspnea   ? Female bladder prolapse   ? GERD (gastroesophageal reflux disease)   ? Glaucoma   ? Hyperlipidemia   ? Hypertension   ? echo and stress 4/10 reports on chart, EKG ` LOV 9/12 on chart  ? S/P TAVR (transcatheter aortic valve replacement) 03/23/2021  ? Edwards 65m S3U TF approach with Dr. MAngelena Formand Dr. BCyndia Bent ? Severe aortic stenosis   ? Thyroid disease   ? ?Past Surgical History:  ?Procedure Laterality Date  ? ABDOMINAL HYSTERECTOMY    ? ANTERIOR AND POSTERIOR REPAIR  04/26/2011  ? Procedure: ANTERIOR (CYSTOCELE) AND POSTERIOR REPAIR (RECTOCELE);  Surgeon: SReece Packer MD;  Location: [facility];  Service: Urology;  Laterality: N/A;  ? BIOPSY  05/25/2020  ? Procedure: BIOPSY;  Surgeon: DDoran Stabler MD;  [facility];  Service: Gastroenterology;;  ? CATARACT EXTRACTION Bilateral   ? with IOL  ? CHOLECYSTECTOMY  2009  ? COLONOSCOPY N/A 12/02/2013  ? three colon polyps removed, small internal hemorrhoids. Hyperplastic polyps  ? COLONOSCOPY N/A 11/20/2019  ? pancolonic diverticulosis, two 10-11 mm polyps in ascending colon, one 5 mm polyp in cecum, ascending colon with superficially invasive adenocarcinoma arising in background of sessile serrated polyps with low and high grade cytologic dysplasia.  ? COLONOSCOPY    ? COLONOSCOPY W/ POLYPECTOMY    ? COLONOSCOPY WITH PROPOFOL N/A 05/25/2020  ? diverticulosis in right colon, redundant colon. Caution warranted on future colonoscopy in light of age, cardiac condition, challenging anatomy.   ? DILATION AND CURETTAGE OF UTERUS    ? pt states this  was in the 1970's  ? ESOPHAGOGASTRODUODENOSCOPY (EGD) WITH PROPOFOL N/A 05/25/2020  ? Grade 1 esophageal varices, single mucosal nodule in stomach s/p biopsy. (hyperplastic)>   ? LEFT HEART CATH  09/10/2008  ? normal coronary arteries, normal LV systolic function, EF 637%(Dr. HNorlene Duel  ? LYMPH NODE DISSECTION Right 1997  ? under arm  ? NM MYOCAR PERF WALL MOTION  2010  ? dipyridamole - mild-mod in intenstiy perfusion defect in mid anterior, mid anteroseptal wall, EF 70%  ? OVARY SURGERY    ? bilateral tumors removed  ? POLYPECTOMY  11/20/2019  ? Procedure: POLYPECTOMY;  Surgeon: RDaneil Dolin MD;  [facility];  Service: Endoscopy;;  hot and cold snare cecal polyp, and asending polyps x 2  ? RIGHT/LEFT HEART CATH AND CORONARY ANGIOGRAPHY N/A 10/02/2020  ? Procedure: RIGHT/LEFT HEART CATH AND CORONARY ANGIOGRAPHY;  Surgeon: JMartinique Peter M, MD;  [facility];  Service: Cardiovascular;  Laterality: N/A;  ? THYROIDECTOMY    ? THYROIDECTOMY  02/2008  ? TRANSCATHETER AORTIC VALVE REPLACEMENT, TRANSFEMORAL N/A 03/23/2021  ? Procedure: TRANSCATHETER AORTIC VALVE REPLACEMENT, TRANSFEMORAL;  Surgeon: MBurnell Blanks MD;  [facility];  Service: Open Heart Surgery;  Laterality: N/A;  ? TRANSTHORACIC ECHOCARDIOGRAM  08/2011  ? EF=>55%, mild conc LVH; trace MR; mild  TR; mild-mod AV calcification with mild valvular AV stenosis  ? VAGINAL PROLAPSE REPAIR  04/26/2011  ? Procedure: VAGINAL VAULT SUSPENSION;  Surgeon: Reece Packer, MD;  Location: [facility];  Service: Urology;  Laterality: N/A;  with Graft  10x6  ? ?Social History  ? ?Socioeconomic History  ? Marital status: Married  ?  Spouse name: Delfino Lovett  ? Number of children: 1  ? Years of education: Trade  ? Highest education level: 12th grade  ?Occupational History  ? Occupation: Retired  ? Occupation: Writer  ?Tobacco Use  ? Smoking status: Never  ? Smokeless tobacco: Never  ?Vaping Use  ? Vaping Use:  Never used  ?Substance and Sexual Activity  ? Alcohol use: Not Currently  ? Drug use: No  ? Sexual activity: Yes  ?Other Topics Concern  ? Not on file  ?Social History Narrative  ? Patient lives at home with spouse. 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 LIVED IN [place] FOR 50 YRS AND CAME BACK TO [place] ~2009. Caffeine Use: 1 cup of coffee daily. HAD ONE CHILD: PASSED 5 YRS. HAVE THREE GRAND-KIDS AND TWO GREAT GRANDS.   ? ?Social Determinants of Health  ? ?Financial Resource Strain: Low Risk   ? Difficulty of Paying Living Expenses: Not very hard  ?Food Insecurity: No Food Insecurity  ? Worried About Charity fundraiser in the Last Year: Never true  ? Ran Out of Food in the Last Year: Never true  ?Transportation Needs: No Transportation Needs  ? Lack of Transportation (Medical): No  ? Lack of Transportation (Non-Medical): No  ?Physical Activity: Insufficiently Active  ? Days of Exercise per Week: 7 days  ? Minutes of Exercise per Session: 10 min  ?Stress: No Stress Concern Present  ? Feeling of Stress : Not at all  ?Social Connections: Socially Integrated  ? Frequency of Communication with Friends and Family: More than three times a week  ? Frequency of Social Gatherings with Friends and Family: Once a week  ? Attends Religious Services: More than 4 times per year  ? Active Member of Clubs or Organizations: Yes  ? Attends Archivist Meetings: 1 to 4 times per year  ? Marital Status: Married  ? ?Outpatient Encounter Medications as of 09/01/2021  ?Medication Sig  ? ACCU-CHEK GUIDE test strip USE TO CHECK BLOOD SUGAR TWICE DAILY  ? acetaminophen (TYLENOL) 500 MG tablet Take 500-1,000 mg by mouth every 6 (six) hours as needed (pain).  ? ALPHAGAN P 0.1 % SOLN Place 1 drop into both eyes 2 (two) times daily.  ? amLODipine (NORVASC) 10 MG tablet TAKE 1 TABLET(10 MG) BY MOUTH EVERY MORNING  ? amoxicillin (AMOXIL) 500 MG capsule Take 4 capsules (2,000 mg total) by mouth as needed (1 hour prior  to dental work).  ? Ascorbic Acid (VITAMIN C) 1000 MG tablet Take 1,000 mg by mouth 4 (four) times a week. AT NIGHT  ? aspirin EC 81 MG tablet Take 81 mg by mouth every other day. In the morning. Swallow whole.  ? benazepril (LOTENSIN) 40 MG tablet TAKE 1 TABLET(40 MG) BY MOUTH DAILY  ? Blood Glucose Monitoring Suppl (ACCU-CHEK GUIDE) w/Device KIT 1 each by Does not apply route 4 (four) times daily.  ? cholecalciferol (VITAMIN D3) 25 MCG (1000 UT) tablet Take 1,000 Units by mouth in the morning.  ? clotrimazole-betamethasone (LOTRISONE) cream Apply twice daily for 1 week, to affected area , then as needed  ? Continuous  Blood Gluc Receiver (FREESTYLE LIBRE 2 READER) DEVI As directed  ? ezetimibe (ZETIA) 10 MG tablet Take 1 tablet (10 mg total) by mouth daily.  ? glipiZIDE (GLUCOTROL XL) 5 MG 24 hr tablet TAKE 1 TABLET(5 MG) BY MOUTH DAILY WITH BREAKFAST  ? insulin isophane & regular human KwikPen (HUMULIN 70/30 MIX) (70-30) 100 UNIT/ML KwikPen Inject 60 units with breakfast and 40 units with supper only if glucose is above 90  ? Insulin Pen Needle (B-D ULTRAFINE III SHORT PEN) 31G X 8 MM MISC USE AS DIRECTED TWICE DAILY WITH INSULIN PENS  ? latanoprost (XALATAN) 0.005 % ophthalmic solution Place 1 drop into both eyes at bedtime.   ? linaclotide (LINZESS) 290 MCG CAPS capsule Take 1 capsule (290 mcg total) by mouth daily before breakfast.  ? LINZESS 290 MCG CAPS capsule TAKE 1 CAPSULE(290 MCG) BY MOUTH DAILY BEFORE AND BREAKFAST  ? metFORMIN (GLUCOPHAGE) 500 MG tablet TAKE 1 TABLET BY MOUTH EVERY DAY WITH BREAKFAST  ? Multiple Vitamin (MULTIVITAMIN WITH MINERALS) TABS tablet Take 1 tablet by mouth daily.  ? Omega-3 Fatty Acids (FISH OIL) 1200 MG CAPS Take 1,200 mg by mouth every morning.  ? pantoprazole (PROTONIX) 40 MG tablet Take 1 tablet (40 mg total) by mouth daily. 30 minutes before meal  ? polyethylene glycol (MIRALAX) 17 g packet Take 17 g by mouth at bedtime.  ? pravastatin (PRAVACHOL) 80 MG tablet TAKE 1  TABLET(80 MG) BY MOUTH DAILY  ? triamterene-hydrochlorothiazide (MAXZIDE) 75-50 MG tablet TAKE 1 TABLET BY MOUTH DAILY  ? ?No facility-administered encounter medications on file as of 09/01/2021.  ? ?ALLERG

## 2021-09-01 NOTE — Progress Notes (Signed)
Medical Nutrition Therapy  Follow up ?Appointment Start time:  7989 Appointment End time: 1000 ?Primary concerns today: DM Type 2, Overweight ?Referral diagnosis: E11.8, E66.9 ?Preferred learning style:  No preferences.  ?Learning readiness: Ready   ?NUTRITION ASSESSMENT  ?Saw Dr. Dorris Fetch today. No changes in medications ?Currently taking 70/30 insulin 60 units in am and 40 units  at night. ?A1C down to 7.1% from 7.4% ?Glipizide 5 mg a day. ?FBS 90-120's. Night time 200's.   ?She notes she tends to snack later at night. Sometimes sleeps in. ?. ?Doing much better overall. Still room for improvement.. ? ?Elevated Creatine level. ? ?Anthropometrics  ?Wt Readings from Last 3 Encounters:  ?09/01/21 188 lb 3.2 oz (85.4 kg)  ?08/21/21 179 lb (81.2 kg)  ?08/10/21 186 lb 12.8 oz (84.7 kg)  ? ?Ht Readings from Last 3 Encounters:  ?09/01/21 '5\' 5"'$  (1.651 m)  ?08/21/21 '5\' 5"'$  (1.651 m)  ?08/10/21 '5\' 5"'$  (1.651 m)  ? ?There is no height or weight on file to calculate BMI. ?'@BMIFA'$ @ ?Facility age limit for growth percentiles is 20 years. ?Facility age limit for growth percentiles is 20 years. ?  ? ?Clinical ?Medical Hx: DM, Obesity.  ?Medications: Glipizide, 70/30 60 units in am and 40 units pm ?Labs:  ?Lab Results  ?Component Value Date  ? HGBA1C 7.1 (A) 09/01/2021  ? ? ?  Latest Ref Rng & Units 08/21/2021  ? 10:31 PM 05/21/2021  ?  3:50 PM 04/28/2021  ?  8:18 AM  ?CMP  ?Glucose 70 - 99 mg/dL 166   92   163    ?BUN 8 - 23 mg/dL '17   18   21    '$ ?Creatinine 0.44 - 1.00 mg/dL 1.10   1.13   1.13    ?Sodium 135 - 145 mmol/L 136   138   137    ?Potassium 3.5 - 5.1 mmol/L 3.4   3.7   4.3    ?Chloride 98 - 111 mmol/L 99   99   96    ?CO2 22 - 32 mmol/L '28   30   26    '$ ?Calcium 8.9 - 10.3 mg/dL 9.4   10.3   10.1    ?Total Protein 6.0 - 8.5 g/dL   7.1    ?Total Bilirubin 0.0 - 1.2 mg/dL   <0.2    ?Alkaline Phos 44 - 121 IU/L   57    ?AST 0 - 40 IU/L   22    ?ALT 0 - 32 IU/L   19    ? ?Lipid Panel  ?   ?Component Value Date/Time  ? CHOL 165  08/03/2021 1023  ? TRIG 184 (H) 08/03/2021 1023  ? HDL 44 08/03/2021 1023  ? CHOLHDL 3.8 08/03/2021 1023  ? CHOLHDL 4.7 07/30/2019 0718  ? VLDL 39 12/05/2017 0832  ? [place] 90 08/03/2021 1023  ? [place] 99 07/30/2019 0718  ? LABVLDL 31 08/03/2021 1023  ? ? ?Notable Signs/Symptoms: Sometimes she gets shaky or is really thirsty ? ?Lifestyle & Dietary Hx ?Wants to improve her DM. Eats 3 meals per day. Cooks from scratch.  ?Admits to sometimes missing her insulin because she forgets or she eats later after supper. ? ? ?Estimated daily fluid intake: 84 oz ?Supplements:  ?Sleep: good ?Stress / self-care: none ?Current average weekly physical activity: Walks ? ?24-Hr Dietary Recall ?First Meal: Cheerios, almond milk,  ?Snack:  ?Second Meal: [place] sandwich with lettuce/tomato, water ?Snack:  ?Third Meal: Turnip greens, squash, cornbread, water ?  Snack: shortbread cookie ?Beverages: water ? ?Estimated Energy Needs ?Calories: 1200  ?Carbohydrate: 135g ?Protein: 90g ?Fat: 33g ? ? ?NUTRITION DIAGNOSIS  ?NB-1.1 Food and nutrition-related knowledge deficit As related to Diabetes .  As evidenced by A1C 7.8%. ? ? ?NUTRITION INTERVENTION  ?Nutrition education (E-1) on the following topics:  ?Lifestyle Medicine ?- Whole Food, Plant Predominant Nutrition is highly recommended: Eat Plenty of vegetables, Mushrooms, fruits, Legumes, Whole Grains, Nuts, seeds in lieu of processed meats, processed snacks/pastries red meat, poultry, eggs.  ?  ?-It is better to avoid simple carbohydrates including: Cakes, Sweet Desserts, Ice Cream, Soda (diet and regular), Sweet Tea, Candies, Chips, Cookies, Store Bought Juices, Alcohol in Excess of  1-2 drinks a day, Lemonade,  Artificial Sweeteners, Doughnuts, Coffee Creamers, "Sugar-free" Products, etc, etc.  This is not a complete list..... ? ?Exercise: If you are able: 30 -60 minutes a day ,4 days a week, or 150 minutes a week.  The longer the better.  Combine stretch, strength, and aerobic activities.   If you were told in the past that you have high risk for cardiovascular diseases, you may seek evaluation by your heart doctor prior to initiating moderate to intense exercise programs. ? ?Handouts Provided Include  ?My Plate ?Meal Plan Card ? ? ?Learning Style & Readiness for Change ?Teaching method utilized: Visual & Auditory  ?Demonstrated degree of understanding via: Teach Back  ?Barriers to learning/adherence to lifestyle change: none ? ?Goals Established by Pt ? ? ?Increase more whole food, plant based foods-foods that come out of a garden. ?Cut out  excess bread and animal meat ?Drinking water  ?Exercise 30 minutes or more 3 times per week. ?Get A1C down 6.5% ?Lose 2 lbs per month. ? ? ?MONITORING & EVALUATION ?Dietary intake, weekly physical activity, and blood sugars  in 1 month. ? ?Next Steps  ?Patient is to work on watching portions and timing of meals.. ? ?

## 2021-09-01 NOTE — Patient Instructions (Signed)

## 2021-09-01 NOTE — Progress Notes (Signed)
? ? ?[facility] ? ?Patient name: Joanna Brown MRN: [id number] DOB: 03/02/1941 Sex: female ? ?REASON FOR CONSULT: Evaluation lower extremity discomfort, rule out arterial insufficiency ? ?HPI: ?Joanna Brown is a 81 y.o. female, who is here today for evaluation of lower extremity arterial flow.  She reports discomfort in both legs.  This is worse on the right than on the left.  She has discomfort beginning in her upper hip and buttock and extending through her posterior buttock thigh and into her calf.  She reports this can be present at rest and with walking.  She has no history of lower extremity tissue loss.  She is status post TAVR ? ?Past Medical History:  ?Diagnosis Date  ? Anginal pain ([place])   ? Arthritis   ? OA  ? Diabetes mellitus   ? Dyspnea   ? Female bladder prolapse   ? GERD (gastroesophageal reflux disease)   ? Glaucoma   ? Hyperlipidemia   ? Hypertension   ? echo and stress 4/10 reports on chart, EKG ` LOV 9/12 on chart  ? S/P TAVR (transcatheter aortic valve replacement) 03/23/2021  ? Edwards 19m S3U TF approach with Dr. MAngelena Formand Dr. BCyndia Bent ? Severe aortic stenosis   ? Thyroid disease   ? ? ?Family History  ?Problem Relation Age of Onset  ? Stomach cancer Mother 554 ? Heart disease Mother 835 ?     heart disease  ? Heart disease Father 580 ?     MI  ? Stroke Maternal Grandfather   ? Heart attack Paternal Grandfather   ? Hypertension Brother   ? Bone cancer Brother   ? Hypertension Sister   ? Liver cancer Sister   ? Hypertension Sister   ? Hypertension Child   ? Colon cancer Neg Hx   ? Colon polyps Neg Hx   ? Pancreatic cancer Neg Hx   ? Esophageal cancer Neg Hx   ? Rectal cancer Neg Hx   ? ? ?SOCIAL HISTORY: ?Social History  ? ?Socioeconomic History  ? Marital status: Married  ?  Spouse name: RDelfino Lovett ? Number of children: 1  ? Years of education: Trade  ? Highest education level: 12th grade  ?Occupational History  ?  Occupation: Retired  ? Occupation: RWriter ?Tobacco Use  ? Smoking status: Never  ? Smokeless tobacco: Never  ?Vaping Use  ? Vaping Use: Never used  ?Substance and Sexual Activity  ? Alcohol use: Not Currently  ? Drug use: No  ? Sexual activity: Yes  ?Other Topics Concern  ? Not on file  ?Social History Narrative  ? Patient lives at home with spouse. 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 LIVED IN [place] FOR 50 YRS AND [place]AME [place]. Caffeine Use: 1 cup of coffee daily. HAD ONE CHILD: PASSED 5 YRS. HAVE THREE GRAND-KIDS AND TWO GREAT GRANDS.   ? ?Social Determinants of Health  ? ?Financial Resource Strain: Low Risk   ? Difficulty of Paying Living Expenses: Not very hard  ?Food Insecurity: No Food Insecurity  ? Worried About RCharity fundraiserin the Last Year: Never true  ? Ran Out of Food in the Last Year: Never true  ?Transportation Needs: No Transportation Needs  ? Lack of Transportation (Medical): No  ? Lack of Transportation (Non-Medical): No  ?Physical Activity: Insufficiently Active  ? Days of Exercise per Week: 7 days  ?  Minutes of Exercise per Session: 10 min  ?Stress: No Stress Concern Present  ? Feeling of Stress : Not at all  ?Social Connections: Socially Integrated  ? Frequency of Communication with Friends and Family: More than three times a week  ? Frequency of Social Gatherings with Friends and Family: Once a week  ? Attends Religious Services: More than 4 times per year  ? Active Member of Clubs or Organizations: Yes  ? Attends Archivist Meetings: 1 to 4 times per year  ? Marital Status: Married  ?Intimate Partner Violence: Not At Risk  ? Fear of Current or Ex-Partner: No  ? Emotionally Abused: No  ? Physically Abused: No  ? Sexually Abused: No  ? ? ?Allergies  ?Allergen Reactions  ? Senokot Wheat Bran [Wheat Bran]   ?  ABDOMINAL CRAMPS  ? Spironolactone   ?  Stomach problems, vision changes   ? ? ?Current Outpatient Medications  ?Medication  Sig Dispense Refill  ? ACCU-CHEK GUIDE test strip USE TO CHECK BLOOD SUGAR TWICE DAILY 150 strip 1  ? acetaminophen (TYLENOL) 500 MG tablet Take 500-1,000 mg by mouth every 6 (six) hours as needed (pain).    ? ALPHAGAN P 0.1 % SOLN Place 1 drop into both eyes 2 (two) times daily.    ? amLODipine (NORVASC) 10 MG tablet TAKE 1 TABLET(10 MG) BY MOUTH EVERY MORNING 90 tablet 3  ? amoxicillin (AMOXIL) 500 MG capsule Take 4 capsules (2,000 mg total) by mouth as needed (1 hour prior to dental work). 12 capsule 2  ? Ascorbic Acid (VITAMIN C) 1000 MG tablet Take 1,000 mg by mouth 4 (four) times a week. AT NIGHT    ? aspirin EC 81 MG tablet Take 81 mg by mouth every other day. In the morning. Swallow whole.    ? benazepril (LOTENSIN) 40 MG tablet TAKE 1 TABLET(40 MG) BY MOUTH DAILY 90 tablet 1  ? Blood Glucose Monitoring Suppl (ACCU-CHEK GUIDE) w/Device KIT 1 each by Does not apply route 4 (four) times daily. 1 kit 0  ? cholecalciferol (VITAMIN D3) 25 MCG (1000 UT) tablet Take 1,000 Units by mouth in the morning.    ? clotrimazole-betamethasone (LOTRISONE) cream Apply twice daily for 1 week, to affected area , then as needed 45 g 0  ? Continuous Blood Gluc Receiver (FREESTYLE LIBRE 2 READER) DEVI As directed 1 each 0  ? ezetimibe (ZETIA) 10 MG tablet Take 1 tablet (10 mg total) by mouth daily. 90 tablet 3  ? glipiZIDE (GLUCOTROL XL) 5 MG 24 hr tablet TAKE 1 TABLET(5 MG) BY MOUTH DAILY WITH BREAKFAST 90 tablet 0  ? insulin isophane & regular human KwikPen (HUMULIN 70/30 MIX) (70-30) 100 UNIT/ML KwikPen Inject 60 units with breakfast and 40 units with supper only if glucose is above 90 60 mL 3  ? Insulin Pen Needle (B-D ULTRAFINE III SHORT PEN) 31G X 8 MM MISC USE AS DIRECTED TWICE DAILY WITH INSULIN PENS 100 each 5  ? latanoprost (XALATAN) 0.005 % ophthalmic solution Place 1 drop into both eyes at bedtime.     ? linaclotide (LINZESS) 290 MCG CAPS capsule Take 1 capsule (290 mcg total) by mouth daily before breakfast. 90  capsule 3  ? LINZESS 290 MCG CAPS capsule TAKE 1 CAPSULE(290 MCG) BY MOUTH DAILY BEFORE AND BREAKFAST 90 capsule 3  ? metFORMIN (GLUCOPHAGE) 500 MG tablet TAKE 1 TABLET BY MOUTH EVERY DAY WITH BREAKFAST 60 tablet 2  ? Multiple Vitamin (MULTIVITAMIN WITH MINERALS) TABS  tablet Take 1 tablet by mouth daily.    ? Omega-3 Fatty Acids (FISH OIL) 1200 MG CAPS Take 1,200 mg by mouth every morning.    ? pantoprazole (PROTONIX) 40 MG tablet Take 1 tablet (40 mg total) by mouth daily. 30 minutes before meal 90 tablet 1  ? polyethylene glycol (MIRALAX) 17 g packet Take 17 g by mouth at bedtime. 14 each 0  ? pravastatin (PRAVACHOL) 80 MG tablet TAKE 1 TABLET(80 MG) BY MOUTH DAILY 90 tablet 3  ? triamterene-hydrochlorothiazide (MAXZIDE) 75-50 MG tablet TAKE 1 TABLET BY MOUTH DAILY 90 tablet 2  ? ?No current facility-administered medications for this visit.  ? ? ?REVIEW OF SYSTEMS:  ?'[X]'  denotes positive finding, '[ ]'  denotes negative finding ?Cardiac  Comments:  ?Chest pain or chest pressure:    ?Shortness of breath upon exertion:    ?Short of breath when lying flat:    ?Irregular heart rhythm:    ?    ?Vascular    ?Pain in calf, thigh, or hip brought on by ambulation:    ?Pain in feet at night that wakes you up from your sleep:     ?Blood clot in your veins:    ?Leg swelling:     ?    ?Pulmonary    ?Oxygen at home:    ?Productive cough:     ?Wheezing:     ?    ?Neurologic    ?Sudden weakness in arms or legs:     ?Sudden numbness in arms or legs:     ?Sudden onset of difficulty speaking or slurred speech:    ?Temporary loss of vision in one eye:     ?Problems with dizziness:     ?    ?Gastrointestinal    ?Blood in stool:     ?Vomited blood:     ?    ?Genitourinary    ?Burning when urinating:     ?Blood in urine:    ?    ?Psychiatric    ?Major depression:     ?    ?Hematologic    ?Bleeding problems:    ?Problems with blood clotting too easily:    ?    ?Skin    ?Rashes or ulcers:    ?    ?Constitutional    ?Fever or chills:     ? ? ?PHYSICAL EXAM: ?Vitals:  ? 09/01/21 1355  ?BP: (!) 155/72  ?Pulse: 73  ?Resp: 18  ?Temp: 99.9 ?F (37.7 ?C)  ?TempSrc: Temporal  ?SpO2: 97%  ?Weight: 189 lb 3.2 oz (85.8 kg)  ? ? ?GENERAL: The patient is a well-nour

## 2021-09-01 NOTE — Patient Instructions (Signed)
Goals ? ?Increase more whole food, plant based foods-foods that come out of a garden. ?Cut out bread and meat ?Drinking water  ?Exercise 30 minutes or more 3 times per week. ?Get A1C down 6.5% ?Lose 2 lbs per month. ? ?

## 2021-09-06 ENCOUNTER — Encounter: Payer: Self-pay | Admitting: Podiatry

## 2021-09-06 ENCOUNTER — Ambulatory Visit (INDEPENDENT_AMBULATORY_CARE_PROVIDER_SITE_OTHER): Payer: Medicare Other | Admitting: Podiatry

## 2021-09-06 DIAGNOSIS — N181 Chronic kidney disease, stage 1: Secondary | ICD-10-CM

## 2021-09-06 DIAGNOSIS — Z794 Long term (current) use of insulin: Secondary | ICD-10-CM | POA: Diagnosis not present

## 2021-09-06 DIAGNOSIS — M79675 Pain in left toe(s): Secondary | ICD-10-CM | POA: Diagnosis not present

## 2021-09-06 DIAGNOSIS — M79674 Pain in right toe(s): Secondary | ICD-10-CM

## 2021-09-06 DIAGNOSIS — B351 Tinea unguium: Secondary | ICD-10-CM

## 2021-09-06 DIAGNOSIS — E1122 Type 2 diabetes mellitus with diabetic chronic kidney disease: Secondary | ICD-10-CM | POA: Diagnosis not present

## 2021-09-10 DIAGNOSIS — Z20822 Contact with and (suspected) exposure to covid-19: Secondary | ICD-10-CM | POA: Diagnosis not present

## 2021-09-12 NOTE — Progress Notes (Signed)
?  Subjective:  ?Patient ID: Joanna Brown, female    DOB: [birth year],  MRN: [id number] ? ?Joanna Brown presents to clinic today for at risk foot care. Pt has h/o NIDDM with chronic kidney disease and painful thick toenails that are difficult to trim. Pain interferes with ambulation. Aggravating factors include wearing enclosed shoe gear. Pain is relieved with periodic professional debridement. ? ?Patient states blood glucose was 116 mg/dl today.  Last known HgA1c was 7%. ? ?New problem(s): None.  ? ?PCP is Fayrene Helper, MD , and last visit was August 03, 2021. ? ?Allergies  ?Allergen Reactions  ? Senokot Wheat Bran [Wheat Bran]   ?  ABDOMINAL CRAMPS  ? Spironolactone   ?  Stomach problems, vision changes   ? ? ?Review of Systems: Negative except as noted in the HPI. ? ?Objective: No changes noted in today's physical examination. ? ?Vascular Examination: ?Vascular status intact b/l with palpable pedal pulses. CFT immediate b/l. No edema. No pain with calf compression b/l. Skin temperature gradient WNL b/l. No ischemia or gangrene noted b/l LE. No cyanosis or clubbing noted b/l LE. ? ?Neurological Examination: ?Sensation grossly intact b/l with 10 gram monofilament. Vibratory sensation intact b/l. Protective sensation intact 5/5 intact bilaterally with 10g monofilament b/l. ? ?Dermatological Examination: ?Pedal skin with normal turgor, texture and tone b/l. Toenails 1-5 b/l thick, discolored, elongated with subungual debris and pain on dorsal palpation. No hyperkeratotic lesions noted b/l.  ? ?Musculoskeletal Examination: ?Muscle strength 5/5 to b/l LE. HAV with bunion deformity noted b/l LE. Patient ambulates independent of any assistive aids. ? ?Radiographs: None ? ?  Latest Ref Rng & Units 09/01/2021  ?  9:32 AM 03/22/2021  ? 11:30 AM 12/31/2020  ?  9:47 AM  ?Hemoglobin A1C  ?Hemoglobin-A1c 0.0 - 7.0 % 7.1   7.4   7.8    ? ?Assessment/Plan: ?1. Pain due to onychomycosis of toenails of both feet   ?2. Type 2  diabetes mellitus with stage 1 chronic kidney disease, with long-term current use of insulin ([place])   ?  ?-Patient was evaluated and treated. All patient's and/or POA's questions/concerns answered on today's visit. ?-Patient to continue soft, supportive shoe gear daily. ?-Toenails 1-5 b/l were debrided in length and girth with sterile nail nippers and dremel without iatrogenic bleeding.  ?-Patient/POA to call should there be question/concern in the interim.  ? ?Return in about 3 months (around 12/06/2021). ? ?Marzetta Board, DPM  ?

## 2021-09-13 ENCOUNTER — Encounter: Payer: Self-pay | Admitting: Nutrition

## 2021-09-13 DIAGNOSIS — Z20822 Contact with and (suspected) exposure to covid-19: Secondary | ICD-10-CM | POA: Diagnosis not present

## 2021-09-15 DIAGNOSIS — Z20822 Contact with and (suspected) exposure to covid-19: Secondary | ICD-10-CM | POA: Diagnosis not present

## 2021-09-16 DIAGNOSIS — Z20828 Contact with and (suspected) exposure to other viral communicable diseases: Secondary | ICD-10-CM | POA: Diagnosis not present

## 2021-09-20 DIAGNOSIS — Z20822 Contact with and (suspected) exposure to covid-19: Secondary | ICD-10-CM | POA: Diagnosis not present

## 2021-09-21 DIAGNOSIS — Z20822 Contact with and (suspected) exposure to covid-19: Secondary | ICD-10-CM | POA: Diagnosis not present

## 2021-09-27 ENCOUNTER — Ambulatory Visit (INDEPENDENT_AMBULATORY_CARE_PROVIDER_SITE_OTHER): Payer: Medicare Other

## 2021-09-27 DIAGNOSIS — Z23 Encounter for immunization: Secondary | ICD-10-CM | POA: Diagnosis not present

## 2021-10-07 ENCOUNTER — Ambulatory Visit (INDEPENDENT_AMBULATORY_CARE_PROVIDER_SITE_OTHER): Payer: Medicare Other | Admitting: Internal Medicine

## 2021-10-07 ENCOUNTER — Encounter: Payer: Self-pay | Admitting: Internal Medicine

## 2021-10-07 VITALS — BP 128/72 | HR 81 | Ht 65.0 in | Wt 188.4 lb

## 2021-10-07 DIAGNOSIS — E1122 Type 2 diabetes mellitus with diabetic chronic kidney disease: Secondary | ICD-10-CM | POA: Diagnosis not present

## 2021-10-07 DIAGNOSIS — E785 Hyperlipidemia, unspecified: Secondary | ICD-10-CM | POA: Diagnosis not present

## 2021-10-07 DIAGNOSIS — I10 Essential (primary) hypertension: Secondary | ICD-10-CM | POA: Diagnosis not present

## 2021-10-07 DIAGNOSIS — Z952 Presence of prosthetic heart valve: Secondary | ICD-10-CM

## 2021-10-07 DIAGNOSIS — N181 Chronic kidney disease, stage 1: Secondary | ICD-10-CM | POA: Diagnosis not present

## 2021-10-07 DIAGNOSIS — Z794 Long term (current) use of insulin: Secondary | ICD-10-CM

## 2021-10-07 NOTE — Patient Instructions (Signed)
Medication Instructions:  Your physician recommends that you continue on your current medications as directed. Please refer to the Current Medication list given to you today.  *If you need a refill on your cardiac medications before your next appointment, please call your pharmacy*  Lab Work: NONE ordered at this time of appointment   If you have labs (blood work) drawn today and your tests are completely normal, you will receive your results only by: MyChart Message (if you have MyChart) OR A paper copy in the mail If you have any lab test that is abnormal or we need to change your treatment, we will call you to review the results.  Testing/Procedures: NONE ordered at this time of appointment   Follow-Up: At [facility], you and your health needs are our priority.  As part of our continuing mission to provide you with exceptional heart care, we have created designated Provider Care Teams.  These Care Teams include your primary Cardiologist (physician) and Advanced Practice Providers (APPs -  Physician Assistants and Nurse Practitioners) who all work together to provide you with the care you need, when you need it.  Your next appointment:   1 year(s)  The format for your next appointment:   In Person  Provider:   Kenneth C Hilty, MD     Other Instructions   Important Information About Sugar       

## 2021-10-07 NOTE — Progress Notes (Signed)
OFFICE NOTE  Chief Complaint:  Follow-up aortic stenosis  Primary Care Physician: Fayrene Helper, MD  HPI:  Joanna Brown is a 81 year old female who has a history of mild aortic stenosis with a valve area of approximately 1.7 cm, also a history of dyslipidemia and hypertension, both of which have been well controlled. We are seeing her back for an annual visit today. She reports actually feeling very well, started walking every day, has made major changes to her diet as reflected by a marked decrease in triglycerides. Unfortunately recently she had a mild elevation in liver enzymes slightly above normal on lovastatin. Typically we could tolerate liver enzymes up to 3 times normal on statin medications, however, she was taken off the lovastatin. Either way it is questionable whether she really needs the additional statin at this time and this certainly argues that she should have further workup as to why she had elevated liver enzymes, whether this is due to a new non-alcoholic steatohepatitis or perhaps concomitant medications causing her elevated liver enzymes.  In fact, I reviewed her CT scan today he years ago and she does have steatohepatitis.  Therefore she is not a good candidate for a statin medication. Her blood pressure seems to be well-controlled on her current regimen.  I saw Joanna Brown back in the office today. She is without any new complaints. She occasionally gets some heartburn and is not currently taking medication for that. She denies any chest pain or worsening shortness of breath.  Joanna Brown returns today for follow-up. Again she is without complaints. We discussed her aortic stenosis however she seemed to have little recollection that she has this disorder. I again went over aortic stenosis and the fact that she has mild to moderate narrowing of the aortic valve. This time we are monitoring it clinically. Her last echo was in April of last year. She is  asymptomatic. Her blood pressure is well controlled. She had recent laboratory work which shows an LDL cholesterol of 70 in October which is good control. Her hemoglobin A1c is 7 which is down from 8.3 prior to that. I've encouraged her to continue with this trend is good cholesterol and blood sugar control her helpful in slowing the process of aortic stenosis.  09/12/2016  Joanna Brown was seen today in follow-up. Overall she seems to be doing well. She has no complaints such as shortness of breath or chest pain. EKG is stable showing normal sinus rhythm at 70 with LAFB. Blood pressure is at goal today. She reports her 11 A1c is in the low 7 range. Cholesterol is also been fairly well controlled. She does have a history of moderate aortic stenosis and is due for repeat echo.  09/12/2017  Joanna Brown was seen today in follow-up.  Over the past year she denies any chest pain or worsening shortness of breath.  Unfortunately her hemoglobin A1c is up from the low sevens to 8.2.  Cholesterol also is higher than goal.  Her total cholesterol is 171, HDL 40, LDL 97 and triglycerides 227.  She reports compliance with pravastatin.  She has moderate aortic stenosis with a mean gradient of 20 mmHg based on echo last year and normal LV function.  She did report one brief episode of chest discomfort with more marked exertion a few days ago.  This was right sided underneath the right breast and was a crampy sensation which improved after resting.  I advised that she monitor that further and  if she has more recurrence we may need to consider stress testing.  12/31/2018  Joanna Brown is seen today for routine follow-up.  She was seen in April 2019.  Since then she reports she was doing fairly well up to about 2 weeks ago.  She has had some worsening shortness of breath, particularly at night and while laying down.  She has a mild nonproductive cough.  She also reports some occasional lower extremity edema.  She gets some  shortness of breath with exertion but denies any chest pain, pressure, heaviness or other anginal symptoms.  EKG today shows incomplete right bundle branch pattern.  Her diabetes not well controlled with A1c recently of 8.8.  Her recent cholesterol profile in February showed total cholesterol 145, triglycerides 225, HDL 41 and LDL 73.  She has known aortic stenosis which is at least moderate however not assessed since 2018 by echo.  04/30/2019  Joanna Brown returns today for follow-up.  She had an echo in August 2020 which showed an EF 50 to 55%, moderate LVH, grade 1 diastolic dysfunction.  There was moderate to severe aortic stenosis with a mean gradient of 21 mmHg, AVA by VTI was 1 cm with a dimensionless index of 0.21.  Symptom wise she does report some shortness of breath with exertion but is not consistent.  She has required some pillows to elevate her head at night.  She denies any chest pain at rest.  She has had no presyncopal or syncopal episodes.  10/24/2019  Joanna Brown is seen today in follow-up.  She reports that she has had some chest pain on and off for the past 2 weeks.  She thought initially it might be something that she ate however she has had several episodes with doing house work and other activities that do improve with rest.  She was supposed to have a repeat echo in December and actually is scheduled for repeat echo next week.  On exam today her aortic valve sounds moderately severe.  These could be symptoms associated with the valve.  EKG was personally reviewed shows no new ischemic changes.  11/27/2019  Joanna Brown is seen today in follow-up.  She recently had an echocardiogram to evaluate her aortic stenosis.  As expected the valve is moderately severe.  LVEF is normal with mean gradient 29 mmHg and dimensionless index of 0.26.  Aortic valve area 0.91 cm.  This is more consistent with moderate to severe AS and it appeared visually severely stenotic suggesting possible low-flow low  gradient severe aortic stenosis.  After discussing with her today does not seem like she is having any exertional symptoms, chest pain, heart failure or presyncopal symptoms.  Unfortunately recently she underwent a colonoscopy was found to have a cancerous polyp.  That was removed however a surgical consultation was recommended.  The patient is hesitant with her surgical recommendation and wishes to see another specialist in [place] if possible.  04/16/2020  Joanna Brown returns today for follow-up.  She is due for repeat echo for aortic stenosis but not until January when it scheduled.  She denies any recurrent chest pain or worsening shortness of breath.  She says she is fairly active.  Was felt that she might have a more severe aortic stenosis with a low gradient based on her last echo in June.  Apparently no further work-up was performed regarding her colonoscopy which was abnormal.  She never had surgery because the colon was not appropriately marked.  She is supposed to have  another colonoscopy.  09/24/2020  Joanna Brown is seen today in follow-up as an urgent visit.  Over the past week or so she is noted worsening fatigue and shortness of breath.  She also had an episode that lasted about 30 minutes of left-sided chest discomfort which was felt like a pressure and associated with shortness of breath that eventually resolved.  This occurred at rest.  Over the past several months she has had some decline in her exercise ability.  She used to walk regularly but has backed off on that secondary to this.  Her last echo in January did show moderate to severe aortic stenosis, with a mean gradient of 5 mmHg up from 28 mmHg.  I had recommended a repeat echo in 6 months which would have been July 2022.  03/15/2021  Joanna Brown returns today for follow-up of her aortic stenosis.  She has been seen recently by Dr. Angelena Form and was noted to have severe aortic stenosis, thought to be a poor surgical candidate  but potentially good candidate for TAVR.  There were concerns about access for her valve.  She has an appointment with Dr. Cyndia Bent with surgery on November 7 to discuss this further.  Hopefully she will be a TAVR candidate through a vascular approach.  She denies any worsening shortness of breath or chest pain.  She is had no syncopal episodes.  She has been having some headaches and has been worked up by her PCP.  Is unclear whether this may be related to her moderate to severe aortic stenosis.  10/07/2021  Returns today for follow-up.  She underwent TAVR for aortic stenosis back on March 23, 2021 with a 23 mm Edwards SAPIEN 3 valve.  Subsequent follow-up shows normal leaflet function with a 12 mmHg gradient and no perivalvular leak.  Overall she has done well without any worsening chest pain or shortness of breath.  Blood pressure appears well controlled.  Her A1c was 7.1%.  LDL most recently was 90.  PMHx:  Past Medical History:  Diagnosis Date   Anginal pain ([place])    Arthritis    OA   Diabetes mellitus    Dyspnea    Female bladder prolapse    GERD (gastroesophageal reflux disease)    Glaucoma    Hyperlipidemia    Hypertension    echo and stress 4/10 reports on chart, EKG ` LOV 9/12 on chart   S/P TAVR (transcatheter aortic valve replacement) 03/23/2021   Edwards 12m S3U TF approach with Dr. MAngelena Formand Dr. BCyndia Bent  Severe aortic stenosis    Thyroid disease     Past Surgical History:  Procedure Laterality Date   ABDOMINAL HYSTERECTOMY     ANTERIOR AND POSTERIOR REPAIR  04/26/2011   Procedure: ANTERIOR (CYSTOCELE) AND POSTERIOR REPAIR (RECTOCELE);  Surgeon: SReece Packer MD;  Location: [facility];  Service: Urology;  Laterality: N/A;   BIOPSY  05/25/2020   Procedure: BIOPSY;  Surgeon: DDoran Stabler MD;  Location: [facility];  Service: Gastroenterology;;   CATARACT EXTRACTION Bilateral    with IOL   CHOLECYSTECTOMY  2009   COLONOSCOPY N/A 12/02/2013   three colon  polyps removed, small internal hemorrhoids. Hyperplastic polyps   COLONOSCOPY N/A 11/20/2019   pancolonic diverticulosis, two 10-11 mm polyps in ascending colon, one 5 mm polyp in cecum, ascending colon with superficially invasive adenocarcinoma arising in background of sessile serrated polyps with low and high grade cytologic dysplasia.   COLONOSCOPY     COLONOSCOPY  W/ POLYPECTOMY     COLONOSCOPY WITH PROPOFOL N/A 05/25/2020   diverticulosis in right colon, redundant colon. Caution warranted on future colonoscopy in light of age, cardiac condition, challenging anatomy.    DILATION AND CURETTAGE OF UTERUS     pt states this was in the 1970's   ESOPHAGOGASTRODUODENOSCOPY (EGD) WITH PROPOFOL N/A 05/25/2020   Grade 1 esophageal varices, single mucosal nodule in stomach s/p biopsy. (hyperplastic)>    LEFT HEART CATH  09/10/2008   normal coronary arteries, normal LV systolic function, EF 99% (Dr. [name])   [place] Right 1997   under arm   NM MYOCAR PERF WALL MOTION  2010   dipyridamole - mild-mod in intenstiy perfusion defect in mid anterior, mid anteroseptal wall, EF 70%   OVARY SURGERY     bilateral tumors removed   POLYPECTOMY  11/20/2019   Procedure: POLYPECTOMY;  Surgeon: Daneil Dolin, MD;  [facility]: [facility];  Service: Endoscopy;;  hot and cold snare cecal polyp, and asending polyps x 2   RIGHT/LEFT HEART CATH AND CORONARY ANGIOGRAPHY N/A 10/02/2020   Procedure: RIGHT/LEFT HEART CATH AND CORONARY ANGIOGRAPHY;  Surgeon: Martinique, Peter M, MD;  Location: [facility];  Service: Cardiovascular;  Laterality: N/A;   THYROIDECTOMY     THYROIDECTOMY  02/2008   TRANSCATHETER AORTIC VALVE REPLACEMENT, TRANSFEMORAL N/A 03/23/2021   Procedure: TRANSCATHETER AORTIC VALVE REPLACEMENT, TRANSFEMORAL;  Surgeon: Burnell Blanks, MD;  Location: [facility];  Service: Open Heart Surgery;  Laterality: N/A;   TRANSTHORACIC ECHOCARDIOGRAM  08/2011   EF=>55%, mild  conc LVH; trace MR; mild TR; mild-mod AV calcification with mild valvular AV stenosis   VAGINAL PROLAPSE REPAIR  04/26/2011   Procedure: VAGINAL VAULT SUSPENSION;  Surgeon: Reece Packer, MD;  Location: [facility];  Service: Urology;  Laterality: N/A;  with Graft  10x6    FAMHx:  Family History  Problem Relation Age of Onset   Stomach cancer Mother 14   Heart disease Mother 51       heart disease   Heart disease Father 66       MI   Stroke Maternal Grandfather    Heart attack Paternal Grandfather    Hypertension Brother    Bone cancer Brother    Hypertension Sister    Liver cancer Sister    Hypertension Sister    Hypertension Child    Colon cancer Neg Hx    Colon polyps Neg Hx    Pancreatic cancer Neg Hx    Esophageal cancer Neg Hx    Rectal cancer Neg Hx     SOCHx:   reports that she has never smoked. She has never used smokeless tobacco. She reports that she does not currently use alcohol. She reports that she does not use drugs.  ALLERGIES:  Allergies  Allergen Reactions   Senokot Wheat Bran [Wheat Bran]     ABDOMINAL CRAMPS   Spironolactone     Stomach problems, vision changes     ROS: Pertinent items noted in HPI and remainder of comprehensive ROS otherwise negative.  HOME MEDS: Current Outpatient Medications  Medication Sig Dispense Refill   ACCU-CHEK GUIDE test strip USE TO CHECK BLOOD SUGAR TWICE DAILY 150 strip 1   acetaminophen (TYLENOL) 500 MG tablet Take 500-1,000 mg by mouth every 6 (six) hours as needed (pain).     ALPHAGAN P 0.1 % SOLN Place 1 drop into both eyes 2 (two) times daily.     amLODipine (  NORVASC) 10 MG tablet TAKE 1 TABLET(10 MG) BY MOUTH EVERY MORNING 90 tablet 3   amoxicillin (AMOXIL) 500 MG capsule Take 4 capsules (2,000 mg total) by mouth as needed (1 hour prior to dental work). 12 capsule 2   Ascorbic Acid (VITAMIN C) 1000 MG tablet Take 1,000 mg by mouth 4 (four) times a week. AT NIGHT     aspirin EC 81 MG tablet Take 81 mg by  mouth every other day. In the morning. Swallow whole.     benazepril (LOTENSIN) 40 MG tablet TAKE 1 TABLET(40 MG) BY MOUTH DAILY 90 tablet 1   Blood Glucose Monitoring Suppl (ACCU-CHEK GUIDE) w/Device KIT 1 each by Does not apply route 4 (four) times daily. 1 kit 0   cholecalciferol (VITAMIN D3) 25 MCG (1000 UT) tablet Take 1,000 Units by mouth in the morning.     clotrimazole-betamethasone (LOTRISONE) cream Apply twice daily for 1 week, to affected area , then as needed 45 g 0   ezetimibe (ZETIA) 10 MG tablet Take 1 tablet (10 mg total) by mouth daily. 90 tablet 3   glipiZIDE (GLUCOTROL XL) 5 MG 24 hr tablet TAKE 1 TABLET(5 MG) BY MOUTH DAILY WITH BREAKFAST 90 tablet 0   insulin isophane & regular human KwikPen (HUMULIN 70/30 MIX) (70-30) 100 UNIT/ML KwikPen Inject 60 units with breakfast and 40 units with supper only if glucose is above 90 60 mL 3   Insulin Pen Needle (B-D ULTRAFINE III SHORT PEN) 31G X 8 MM MISC USE AS DIRECTED TWICE DAILY WITH INSULIN PENS 100 each 5   latanoprost (XALATAN) 0.005 % ophthalmic solution Place 1 drop into both eyes at bedtime.      linaclotide (LINZESS) 290 MCG CAPS capsule Take 1 capsule (290 mcg total) by mouth daily before breakfast. 90 capsule 3   LINZESS 290 MCG CAPS capsule TAKE 1 CAPSULE(290 MCG) BY MOUTH DAILY BEFORE AND BREAKFAST 90 capsule 3   metFORMIN (GLUCOPHAGE) 500 MG tablet TAKE 1 TABLET BY MOUTH EVERY DAY WITH BREAKFAST 60 tablet 2   Multiple Vitamin (MULTIVITAMIN WITH MINERALS) TABS tablet Take 1 tablet by mouth daily.     Omega-3 Fatty Acids (FISH OIL) 1200 MG CAPS Take 1,200 mg by mouth every morning.     pantoprazole (PROTONIX) 40 MG tablet Take 1 tablet (40 mg total) by mouth daily. 30 minutes before meal 90 tablet 1   polyethylene glycol (MIRALAX) 17 g packet Take 17 g by mouth at bedtime. 14 each 0   pravastatin (PRAVACHOL) 80 MG tablet TAKE 1 TABLET(80 MG) BY MOUTH DAILY 90 tablet 3   triamterene-hydrochlorothiazide (MAXZIDE) 75-50 MG  tablet TAKE 1 TABLET BY MOUTH DAILY 90 tablet 2   No current facility-administered medications for this visit.    LABS/IMAGING: No results found for this or any previous visit (from the past 48 hour(s)). No results found.  VITALS: BP 128/72   Pulse 81   Ht $R'5\' 5"'Sj$  (1.651 m)   Wt 188 lb 6.4 oz (85.5 kg)   SpO2 99%   BMI 31.35 kg/m   EXAM: General appearance: alert and no distress Neck: no carotid bruit and no JVD Lungs: clear to auscultation bilaterally Heart: regular rate and rhythm, no murmur Abdomen: soft, non-tender; bowel sounds normal; no masses,  no organomegaly Extremities: extremities normal, atraumatic, no cyanosis or edema Pulses: 2+ and symmetric Skin: Skin color, texture, turgor normal. No rashes or lesions Neurologic: Grossly normal Psych: Mood, affect normal  EKG: deferred  ASSESSMENT: Severe aortic stenosis  status post 23 mm Edwards SAPIEN 3 transcatheter heart valve replacement (03/23/2021)-normal postop gradients without paravalvular leak Hypertension-controlled Dyslipidemia NAFLD GERD DM2 on insulin  PLAN: 1.   Joanna. Neyra seems to be doing much better after TAVR last year.  Subsequent echoes have shown normal function with no perivalvular leak.  She has a low gradient across the valve.  Blood pressure is well controlled.  Her diabetes is near target at A1c 7.1%.  LDL is a little higher at 90.  She need to continue to work on diet and physical activity.  No changes to her medicines today.  Plan follow-up with me annually or sooner as necessary.  Joanna Casino, MD, [facility], [place] Director of the [facility]  [facility] Diplomate of the American Board of Clinical Lipidology Attending Cardiologist  Direct Dial: [phone]  Fax: [phone]  Website:  [email or web address] 10/07/2021, 9:26 AM

## 2021-10-12 ENCOUNTER — Other Ambulatory Visit: Payer: Self-pay | Admitting: "Endocrinology

## 2021-10-18 ENCOUNTER — Other Ambulatory Visit: Payer: Self-pay | Admitting: Family Medicine

## 2021-10-20 ENCOUNTER — Other Ambulatory Visit: Payer: Self-pay | Admitting: "Endocrinology

## 2021-10-22 ENCOUNTER — Other Ambulatory Visit: Payer: Self-pay | Admitting: Nurse Practitioner

## 2021-10-25 ENCOUNTER — Other Ambulatory Visit: Payer: Self-pay | Admitting: Physician Assistant

## 2021-11-13 ENCOUNTER — Other Ambulatory Visit: Payer: Self-pay | Admitting: Nurse Practitioner

## 2021-11-25 DIAGNOSIS — Z794 Long term (current) use of insulin: Secondary | ICD-10-CM | POA: Diagnosis not present

## 2021-11-25 DIAGNOSIS — Z961 Presence of intraocular lens: Secondary | ICD-10-CM | POA: Diagnosis not present

## 2021-11-25 DIAGNOSIS — E119 Type 2 diabetes mellitus without complications: Secondary | ICD-10-CM | POA: Diagnosis not present

## 2021-11-25 DIAGNOSIS — H401131 Primary open-angle glaucoma, bilateral, mild stage: Secondary | ICD-10-CM | POA: Diagnosis not present

## 2021-12-02 ENCOUNTER — Ambulatory Visit: Payer: Medicare Other | Admitting: Nutrition

## 2021-12-02 ENCOUNTER — Ambulatory Visit (INDEPENDENT_AMBULATORY_CARE_PROVIDER_SITE_OTHER): Payer: Medicare Other | Admitting: "Endocrinology

## 2021-12-02 ENCOUNTER — Encounter: Payer: Self-pay | Admitting: "Endocrinology

## 2021-12-02 VITALS — BP 112/58 | HR 72 | Ht 65.0 in | Wt 186.2 lb

## 2021-12-02 DIAGNOSIS — E782 Mixed hyperlipidemia: Secondary | ICD-10-CM

## 2021-12-02 DIAGNOSIS — E1122 Type 2 diabetes mellitus with diabetic chronic kidney disease: Secondary | ICD-10-CM | POA: Diagnosis not present

## 2021-12-02 DIAGNOSIS — I10 Essential (primary) hypertension: Secondary | ICD-10-CM

## 2021-12-02 DIAGNOSIS — Z794 Long term (current) use of insulin: Secondary | ICD-10-CM

## 2021-12-02 DIAGNOSIS — N181 Chronic kidney disease, stage 1: Secondary | ICD-10-CM

## 2021-12-02 LAB — POCT GLYCOSYLATED HEMOGLOBIN (HGB A1C): HbA1c, POC (controlled diabetic range): 7.5 % — AB (ref 0.0–7.0)

## 2021-12-02 MED ORDER — METFORMIN HCL 500 MG PO TABS: 500.0000 mg | ORAL_TABLET | Freq: Every day | ORAL | 1 refills | Status: DC

## 2021-12-02 NOTE — Progress Notes (Signed)
12/02/2021                     Endocrinology follow-up note   Subjective:    Patient ID: Joanna Brown, female    DOB: July 20, 1940. She is being seen in follow-up for uncontrolled type 2 diabetes, complicated by CKD, hyperlipidemia, hypertension.     PMD:   Fayrene Helper, MD  Past Medical History:  Diagnosis Date   Anginal pain ([place])    Arthritis    OA   Diabetes mellitus    Dyspnea    Female bladder prolapse    GERD (gastroesophageal reflux disease)    Glaucoma    Hyperlipidemia    Hypertension    echo and stress 4/10 reports on chart, EKG ` LOV 9/12 on chart   S/P TAVR (transcatheter aortic valve replacement) 03/23/2021   Edwards 66m S3U TF approach with Dr. MAngelena Formand Dr. BCyndia Bent  Severe aortic stenosis    Thyroid disease    Past Surgical History:  Procedure Laterality Date   ABDOMINAL HYSTERECTOMY     ANTERIOR AND POSTERIOR REPAIR  04/26/2011   Procedure: ANTERIOR (CYSTOCELE) AND POSTERIOR REPAIR (RECTOCELE);  Surgeon: SReece Packer MD;  Location: [facility];  Service: Urology;  Laterality: N/A;   BIOPSY  05/25/2020   Procedure: BIOPSY;  Surgeon: DDoran Stabler MD;  Location: [facility];  Service: Gastroenterology;;   CATARACT EXTRACTION Bilateral    with IOL   CHOLECYSTECTOMY  2009   COLONOSCOPY N/A 12/02/2013   three colon polyps removed, small internal hemorrhoids. Hyperplastic polyps   COLONOSCOPY N/A 11/20/2019   pancolonic diverticulosis, two 10-11 mm polyps in ascending colon, one 5 mm polyp in cecum, ascending colon with superficially invasive adenocarcinoma arising in background of sessile serrated polyps with low and high grade cytologic dysplasia.   COLONOSCOPY     COLONOSCOPY W/ POLYPECTOMY     COLONOSCOPY WITH PROPOFOL N/A 05/25/2020   diverticulosis in right colon, redundant colon. Caution warranted on future colonoscopy in light of age, cardiac condition, challenging anatomy.    DILATION AND CURETTAGE OF UTERUS     pt states this  was in the 1970's   ESOPHAGOGASTRODUODENOSCOPY (EGD) WITH PROPOFOL N/A 05/25/2020   Grade 1 esophageal varices, single mucosal nodule in stomach s/p biopsy. (hyperplastic)>    LEFT HEART CATH  09/10/2008   normal coronary arteries, normal LV systolic function, EF 696%(Dr. HNorlene Duel   [place] 1997   under arm   NM MYOCAR PERF WALL MOTION  2010   dipyridamole - mild-mod in intenstiy perfusion defect in mid anterior, mid anteroseptal wall, EF 70%   OVARY SURGERY     bilateral tumors removed   POLYPECTOMY  11/20/2019   Procedure: POLYPECTOMY;  Surgeon: RDaneil Dolin MD;  [facility];  Service: Endoscopy;;  hot and cold snare cecal polyp, and asending polyps x 2   RIGHT/LEFT HEART CATH AND CORONARY ANGIOGRAPHY N/A 10/02/2020   Procedure: RIGHT/LEFT HEART CATH AND CORONARY ANGIOGRAPHY;  Surgeon: JMartinique Peter M, MD;  [facility];  Service: Cardiovascular;  Laterality: N/A;   THYROIDECTOMY     THYROIDECTOMY  02/2008   TRANSCATHETER AORTIC VALVE REPLACEMENT, TRANSFEMORAL N/A 03/23/2021   Procedure: TRANSCATHETER AORTIC VALVE REPLACEMENT, TRANSFEMORAL;  Surgeon: MBurnell Blanks MD;  [facility];  Service: Open Heart Surgery;  Laterality: N/A;   TRANSTHORACIC ECHOCARDIOGRAM  08/2011   EF=>55%, mild conc LVH; trace MR; mild  TR; mild-mod AV calcification with mild valvular AV stenosis   VAGINAL PROLAPSE REPAIR  04/26/2011   Procedure: VAGINAL VAULT SUSPENSION;  Surgeon: Reece Packer, MD;  Location: [facility];  Service: Urology;  Laterality: N/A;  with Graft  10x6   Social History   Socioeconomic History   Marital status: Married    Spouse name: [name]   Number of children: 1   Years of education: Trade   Highest education level: 12th grade  Occupational History   Occupation: Retired   Occupation: Writer  Tobacco Use   Smoking status: Never   Smokeless tobacco: Never  Scientific laboratory technician Use:  Never used  Substance and Sexual Activity   Alcohol use: Not Currently   Drug use: No   Sexual activity: Yes  Other Topics Concern   Not on file  Social History Narrative   Patient lives at home with spouse. 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 LIVED IN [place] FOR 50 YRS AND CAME BACK TO [place] ~2009. Caffeine Use: 1 cup of coffee daily. HAD ONE CHILD: PASSED 5 YRS. HAVE THREE GRAND-KIDS AND TWO GREAT GRANDS.    Social Determinants of Health   Financial Resource Strain: Low Risk  (08/02/2021)   Overall Financial Resource Strain (CARDIA)    Difficulty of Paying Living Expenses: Not very hard  Food Insecurity: No Food Insecurity (08/02/2021)   Hunger Vital Sign    Worried About Running Out of Food in the Last Year: Never true    Ran Out of Food in the Last Year: Never true  Transportation Needs: No Transportation Needs (08/02/2021)   PRAPARE - Hydrologist (Medical): No    Lack of Transportation (Non-Medical): No  Physical Activity: Insufficiently Active (08/02/2021)   Exercise Vital Sign    Days of Exercise per Week: 7 days    Minutes of Exercise per Session: 10 min  Stress: No Stress Concern Present (08/02/2021)   [place]    Feeling of Stress : Not at all  Social Connections: [place] (08/02/2021)   Social Connection and Isolation Panel [NHANES]    Frequency of Communication with Friends and Family: More than three times a week    Frequency of Social Gatherings with Friends and Family: Once a week    Attends Religious Services: More than 4 times per year    Active Member of [employer] or Organizations: Yes    Attends Archivist Meetings: 1 to 4 times per year    Marital Status: Married   Outpatient Encounter Medications as of 12/02/2021  Medication Sig   acetaminophen (TYLENOL) 500 MG tablet Take 500-1,000 mg by mouth every 6 (six) hours as needed  (pain).   ALPHAGAN P 0.1 % SOLN Place 1 drop into both eyes 2 (two) times daily.   amLODipine (NORVASC) 10 MG tablet TAKE 1 TABLET(10 MG) BY MOUTH EVERY MORNING   Ascorbic Acid (VITAMIN C) 1000 MG tablet Take 1,000 mg by mouth 4 (four) times a week. AT NIGHT   aspirin EC 81 MG tablet Take 81 mg by mouth every other day. In the morning. Swallow whole.   benazepril (LOTENSIN) 40 MG tablet TAKE 1 TABLET(40 MG) BY MOUTH DAILY   Blood Glucose Monitoring Suppl (ACCU-CHEK GUIDE) w/Device KIT 1 each by Does not apply route 4 (four) times daily.   cholecalciferol (VITAMIN D3) 25 MCG (1000 UT) tablet Take 1,000 Units by  mouth in the morning.   clotrimazole-betamethasone (LOTRISONE) cream APPLY TOPICALLY TO THE AFFECTED AREA TWICE DAILY FOR 1 WEEK THEN AS NEEDED   ezetimibe (ZETIA) 10 MG tablet Take 1 tablet (10 mg total) by mouth daily.   glipiZIDE (GLUCOTROL XL) 5 MG 24 hr tablet TAKE 1 TABLET(5 MG) BY MOUTH DAILY WITH BREAKFAST   glucose blood (ACCU-CHEK GUIDE) test strip USE TO CHECK BLOOD TWICE DAILY   insulin isophane & regular human KwikPen (HUMULIN 70/30 KWIKPEN) (70-30) 100 UNIT/ML KwikPen 60 units in the am and 40 units at night   Insulin Pen Needle (B-D ULTRAFINE III SHORT PEN) 31G X 8 MM MISC USE AS DIRECTED TWICE DAILY WITH INSULIN PENS   latanoprost (XALATAN) 0.005 % ophthalmic solution Place 1 drop into both eyes at bedtime.    linaclotide (LINZESS) 290 MCG CAPS capsule Take 1 capsule (290 mcg total) by mouth daily before breakfast.   LINZESS 290 MCG CAPS capsule TAKE 1 CAPSULE(290 MCG) BY MOUTH DAILY BEFORE AND BREAKFAST   metFORMIN (GLUCOPHAGE) 500 MG tablet TAKE 1 TABLET BY MOUTH EVERY DAY WITH BREAKFAST   Multiple Vitamin (MULTIVITAMIN WITH MINERALS) TABS tablet Take 1 tablet by mouth daily.   Omega-3 Fatty Acids (FISH OIL) 1200 MG CAPS Take 1,200 mg by mouth every morning.   pantoprazole (PROTONIX) 40 MG tablet TAKE 1 TABLET(40 MG) BY MOUTH DAILY 30 MINUTES BEFORE A MEAL   polyethylene  glycol (MIRALAX) 17 g packet Take 17 g by mouth at bedtime.   pravastatin (PRAVACHOL) 80 MG tablet TAKE 1 TABLET(80 MG) BY MOUTH DAILY   triamterene-hydrochlorothiazide (MAXZIDE) 75-50 MG tablet TAKE 1 TABLET BY MOUTH DAILY   [DISCONTINUED] amoxicillin (AMOXIL) 500 MG capsule Take 4 capsules (2,000 mg total) by mouth as needed (1 hour prior to dental work).   No facility-administered encounter medications on file as of 12/02/2021.   ALLERGIES: Allergies  Allergen Reactions   Senokot Wheat Bran [Wheat Bran]     ABDOMINAL CRAMPS   Spironolactone     Stomach problems, vision changes    VACCINATION STATUS: Immunization History  Administered Date(s) Administered   Hepatitis B, adult 09/27/2021   Moderna SARS-COV2 Booster Vaccination 11/19/2020   Moderna Sars-Covid-2 Vaccination 09/13/2019, 10/12/2019, 04/29/2020   Pneumococcal Conjugate-13 12/11/2013   Pneumococcal Polysaccharide-23 01/13/2010   Tdap 10/05/2010    Diabetes She presents for her follow-up diabetic visit. She has type 2 diabetes mellitus. Onset time: She was diagnosed at approximate age of 44 years. Her disease course has been stable. There are no hypoglycemic associated symptoms. Pertinent negatives for hypoglycemia include no confusion, headaches, pallor or seizures. Pertinent negatives for diabetes include no blurred vision, no chest pain, no fatigue, no polydipsia, no polyphagia and no polyuria. There are no hypoglycemic complications. Symptoms are stable. Diabetic complications include nephropathy and retinopathy. Risk factors for coronary artery disease include dyslipidemia, diabetes mellitus, obesity and sedentary lifestyle. Her weight is increasing steadily. She is following a generally unhealthy diet. When asked about meal planning, she reported none. She has had a previous visit with a dietitian. She rarely participates in exercise. Her home blood glucose trend is decreasing steadily. Her breakfast blood glucose range is  generally 140-180 mg/dl. Her dinner blood glucose range is generally 180-200 mg/dl. Her overall blood glucose range is 180-200 mg/dl. (Joanna Brown presents with near target glycemic profile.  Her point-of-care A1c is 7.5%.  She was without her metformin for at least 1 month because she lost the prescription.     She did not  document significant hypoglycemia.   ) Eye exam is current.  Hyperlipidemia This is a chronic problem. The current episode started more than 1 year ago. The problem is uncontrolled. Exacerbating diseases include diabetes and obesity. Pertinent negatives include no chest pain, myalgias or shortness of breath. Current antihyperlipidemic treatment includes statins. Risk factors for coronary artery disease include dyslipidemia, diabetes mellitus, hypertension, obesity, a sedentary lifestyle and post-menopausal.  Hypertension This is a chronic problem. The current episode started more than 1 year ago. Pertinent negatives include no blurred vision, chest pain, headaches, palpitations or shortness of breath. Risk factors for coronary artery disease include dyslipidemia, diabetes mellitus, obesity and sedentary lifestyle. Hypertensive end-organ damage includes retinopathy.   Nodular goiter She Patient is being seen today for a new issue with her thyroid.  She was found to have 2.6 cm nodule on right lobe of her thyroid while undergoing CT scan of the chest during cancer surveillance.  She has previous history of left hemithyroidectomy approximately 10 years ago for multinodular goiter.  She is not on thyroid hormone supplement or replacement.  She denies dysphagia, shortness of breath, nor voice change. -Her subsequent thyroid ultrasound confirms multinodular right lobe of the thyroid with surgically absent left lobe.  Her right thyroid lobe nodule did not meet criteria for biopsy.  Review of systems  Constitutional: + Minimally fluctuating body weight, current  Body mass index is 30.99  kg/m. , no fatigue, no subjective hyperthermia, no subjective hypothermia    Objective:    BP (!) 112/58   Pulse 72   Ht '5\' 5"'  (1.651 m)   Wt 186 lb 3.2 oz (84.5 kg)   BMI 30.99 kg/m   Wt Readings from Last 3 Encounters:  12/02/21 186 lb 3.2 oz (84.5 kg)  10/07/21 188 lb 6.4 oz (85.5 kg)  09/01/21 189 lb 3.2 oz (85.8 kg)    Physical Exam- Limited  Constitutional:  Body mass index is 30.99 kg/m. , not in acute distress, normal state of mind    CMP ( most recent) CMP     Component Value Date/Time   NA 136 08/21/2021 2231   NA 137 04/28/2021 0818   K 3.4 (L) 08/21/2021 2231   CL 99 08/21/2021 2231   CO2 28 08/21/2021 2231   GLUCOSE 166 (H) 08/21/2021 2231   BUN 17 08/21/2021 2231   BUN 21 04/28/2021 0818   CREATININE 1.10 (H) 08/21/2021 2231   CREATININE 1.11 (H) 03/02/2020 0848   CALCIUM 9.4 08/21/2021 2231   PROT 7.1 04/28/2021 0818   ALBUMIN 4.2 04/28/2021 0818   AST 22 04/28/2021 0818   ALT 19 04/28/2021 0818   ALKPHOS 57 04/28/2021 0818   BILITOT <0.2 04/28/2021 0818   GFRNONAA 51 (L) 08/21/2021 2231   GFRNONAA 57 (L) 07/30/2019 0718   GFRAA 66 07/30/2019 0718    Diabetic Labs (most recent): Lab Results  Component Value Date   HGBA1C 7.5 (A) 12/02/2021   HGBA1C 7.1 (A) 09/01/2021   HGBA1C 7.4 (H) 03/22/2021   MICROALBUR 6.4 07/30/2019   MICROALBUR 11.5 06/28/2018   MICROALBUR 10.3 (H) 07/18/2017    Lipid Panel     Component Value Date/Time   CHOL 165 08/03/2021 1023   TRIG 184 (H) 08/03/2021 1023   HDL 44 08/03/2021 1023   CHOLHDL 3.8 08/03/2021 1023   CHOLHDL 4.7 07/30/2019 0718   VLDL 39 12/05/2017 0832   LDLCALC 90 08/03/2021 1023   LDLCALC 99 07/30/2019 0718   Incidental finding on chest CT on  December 05, 2019 Enlarged  multinodular remnant right thyroid with dominant 2.6 cm hypodense Nodule.   Thyroid ultrasound on December 19, 2019: Right lobe 4.4 cm, left lobe absent surgically. No adenopathy   IMPRESSION: Surgical changes of  left hemithyroidectomy.  Multinodular thyroid.  3.9 cm and 1.6 cm cystic/almost completely cystic nodules with smooth margins, No thyroid nodule meets criteria for biopsy or surveillance, as designated by the newly established ACR TI-RADS criteria.   Assessment & Plan:     1.  Nodular goiter: Patient with remote past history of left hemithyroidectomy for multinodular goiter.  She did not require thyroid hormone supplement.  She was incidentally found to have 2.6 cm nodule in the right lobe. -Her subsequent dedicated thyroid ultrasound confirmed 2 nodules on the right lobe of her thyroid which did not meet any criteria for biopsy. -She will not need any antithyroid intervention at this time.  Her TFTs are consistent with euthyroid presentation.  She will be considered for thyroid function test as well as repeat thyroid ultrasound in a year.     2. Type 2 diabetes mellitus with stage 1 chronic kidney disease, with long-term current use of insulin ([place]).  Joanna Brown presents with near target glycemic profile.  Her point-of-care A1c is 7.5%.  She was without her metformin for at least 1 month because she lost the prescription.     She did not document significant hypoglycemia.   She presents with tighter fasting glycemic profile, above target readings postprandially.   Recent labs reviewed, showing improving renal function.     Her diabetes is complicated by CKD obesity/sedentary life and patient remains at a high risk for more acute and chronic complications of diabetes which include CAD, CVA, CKD, retinopathy, and neuropathy. These are all discussed in detail with the patient.  - I have counseled the patient on diet management and weight loss, by adopting a carbohydrate restricted/protein rich diet.  -She still admits to dietary indiscretions including consumption of sweets and sweetened beverages.  - she acknowledges that there is a room for improvement in her food and drink choices. -  Suggestion is made for her to avoid simple carbohydrates  from her diet including Cakes, Sweet Desserts, Ice Cream, Soda (diet and regular), Sweet Tea, Candies, Chips, Cookies, Store Bought Juices, Alcohol , Artificial Sweeteners,  Coffee Creamer, and "Sugar-free" Products, Lemonade. This will help patient to have more stable blood glucose profile and potentially avoid unintended weight gain.  The following Lifestyle Medicine recommendations according to [facility]) were discussed and and offered to patient and she  agrees to start the journey:  A. Whole Foods, Plant-Based Nutrition comprising of fruits and vegetables, plant-based proteins, whole-grain carbohydrates was discussed in detail with the patient.   A list for source of those nutrients were also provided to the patient.  Patient will use only water or unsweetened tea for hydration. B.  The need to stay away from risky substances including alcohol, smoking; obtaining 7 to 9 hours of restorative sleep, at least 150 minutes of moderate intensity exercise weekly, the importance of healthy social connections,  and stress management techniques were discussed. C.  A full color page of  Calorie density of various food groups per pound showing examples of each food groups was provided to the patient.    - I encouraged the patient to switch to  unprocessed or minimally processed complex starch and increased protein intake (animal or plant source), fruits, and vegetables.  -  Patient is advised to stick to a routine mealtimes to eat 3 meals  a day and avoid unnecessary snacks ( to snack only to correct hypoglycemia).   - I have approached patient with the following individualized plan to manage diabetes and patient agrees:   -In light of her presentation with near target glycemic profile, she will not need any higher dose of insulin.     -She is advised to avoid insulin injection if her Premeal readings are below  11m/dL.    -She is advised to continue  Humulin 70/30 60 units with breakfast and 40 units with supper when Premeal blood glucose readings are above 90 mg per DL.   -She is encouraged to start monitoring blood glucose 4 times a day-before meals and at bedtime .  -Patient is encouraged to call clinic for blood glucose levels less than 70 or above 200 mg /dl. -She is advised to  continue glipizide 5 mg XL p.o. daily at breakfast, and metformin 500 mg p.o. daily at breakfast.    - Patient specific target  A1c;  LDL, HDL, Triglycerides, were discussed in detail.  3) BP/HTN:  -Her blood pressure is controlled to target.   She is advised to continue her current blood pressure medications including benazepril 40 mg p.o. daily at breakfast.    4) Lipids/HPL: Her recent lipid panel showed uncontrolled LDL at 97.  She is advised to continue pravastatin 80 mg p.o. nightly.  Side effects and precautions discussed with her.       5)  Weight/Diet: Her BMI is 30.99--she is a candidate for moderate weight loss.  CDE Consult has been initiated , exercise, and detailed carbohydrates information provided.  6) Chronic Care/Health Maintenance:  -Patient is on ACEI/ARB and Statin medications and encouraged to continue to follow up with Ophthalmology, Podiatrist at least yearly or according to recommendations, and advised to  stay away from smoking. I have recommended yearly flu vaccine and pneumonia vaccination at least every 5 years; moderate intensity exercise for up to 150 minutes weekly; and  sleep for at least 7 hours a day.  POC ABI for PAD screen was normal on June 10, 2020.  This test will be repeated in January 2027, or sooner if needed.     - I advised patient to maintain close follow up with SFayrene Helper MD for primary care needs.    I spent 35 minutes in the care of the patient today including review of labs from [facility], Thyroid Function, Hematology (current and previous  including abstractions from other facilities); face-to-face time discussing  her blood glucose readings/logs, discussing hypoglycemia and hyperglycemia episodes and symptoms, medications doses, her options of short and long term treatment based on the latest standards of care / guidelines;  discussion about incorporating lifestyle medicine;  and documenting the encounter. Risk reduction counseling performed per USPSTF guidelines to reduce obesity and cardiovascular risk factors.     Please refer to Patient Instructions for Blood Glucose Monitoring and Insulin/Medications Dosing Guide"  in media tab for additional information. Please  also refer to " Patient Self Inventory" in the Media  tab for reviewed elements of pertinent patient history.  Joanna Brown participated in the discussions, expressed understanding, and voiced agreement with the above plans.  All questions were answered to her satisfaction. she is encouraged to contact clinic should she have any questions or concerns prior to her return visit.   Follow up plan: - Return in about 4 months (around  04/04/2022) for F/U with Pre-visit Labs, Meter/CGM/Logs, A1c here.  Glade Lloyd, MD Phone: [phone]  Fax: [phone]   This note was partially dictated with voice recognition software. Similar sounding words can be transcribed inadequately or may not  be corrected upon review.  12/02/2021, 10:25 AM

## 2021-12-02 NOTE — Patient Instructions (Signed)
                                     Advice for Weight Management  -For most of us the best way to lose weight is by diet management. Generally speaking, diet management means consuming less calories intentionally which over time brings about progressive weight loss.  This can be achieved more effectively by avoiding ultra processed carbohydrates, processed meats, unhealthy fats.    It is critically important to know your numbers: how much calorie you are consuming and how much calorie you need. More importantly, our carbohydrates sources should be unprocessed naturally occurring  complex starch food items.  It is always important to balance nutrition also by  appropriate intake of proteins (mainly plant-based), healthy fats/oils, plenty of fruits and vegetables.   -The American College of Lifestyle Medicine (ACL M) recommends nutrition derived mostly from Whole Food, Plant Predominant Sources example an apple instead of applesauce or apple pie. Eat Plenty of vegetables, Mushrooms, fruits, Legumes, Whole Grains, Nuts, seeds in lieu of processed meats, processed snacks/pastries red meat, poultry, eggs.  Use only water or unsweetened tea for hydration.  The College also recommends the need to stay away from risky substances including alcohol, smoking; obtaining 7-9 hours of restorative sleep, at least 150 minutes of moderate intensity exercise weekly, importance of healthy social connections, and being mindful of stress and seek help when it is overwhelming.    -Sticking to a routine mealtime to eat 3 meals a day and avoiding unnecessary snacks is shown to have a big role in weight control. Under normal circumstances, the only time we burn stored energy is when we are hungry, so allow  some hunger to take place- hunger means no food between appropriate meal times, only water.  It is not advisable to starve.   -It is better to avoid simple carbohydrates including:  Cakes, Sweet Desserts, Ice Cream, Soda (diet and regular), Sweet Tea, Candies, Chips, Cookies, Store Bought Juices, Alcohol in Excess of  1-2 drinks a day, Lemonade,  Artificial Sweeteners, Doughnuts, Coffee Creamers, "Sugar-free" Products, etc, etc.  This is not a complete list.....    -Consulting with certified diabetes educators is proven to provide you with the most accurate and current information on diet.  Also, you may be  interested in discussing diet options/exchanges , we can schedule a visit with Joanna Brown, RDN, CDE for individualized nutrition education.  -Exercise: If you are able: 30 -60 minutes a day ,4 days a week, or 150 minutes of moderate intensity exercise weekly.    The longer the better if tolerated.  Combine stretch, strength, and aerobic activities.  If you were told in the past that you have high risk for cardiovascular diseases, or if you are currently symptomatic, you may seek evaluation by your heart doctor prior to initiating moderate to intense exercise programs.                                  Additional Care Considerations for Diabetes/Prediabetes   -Diabetes  is a chronic disease.  The most important care consideration is regular follow-up with your diabetes care provider with the goal being avoiding or delaying its complications and to take advantage of advances in medications and technology.  If appropriate actions are taken early enough, type 2 diabetes can even be   reversed.  Seek information from the right source.  - Whole Food, Plant Predominant Nutrition is highly recommended: Eat Plenty of vegetables, Mushrooms, fruits, Legumes, Whole Grains, Nuts, seeds in lieu of processed meats, processed snacks/pastries red meat, poultry, eggs as recommended by American College of  Lifestyle Medicine (ACLM).  -Type 2 diabetes is known to coexist with other important comorbidities such as high blood pressure and high cholesterol.  It is critical to control not only the  diabetes but also the high blood pressure and high cholesterol to minimize and delay the risk of complications including coronary artery disease, stroke, amputations, blindness, etc.  The good news is that this diet recommendation for type 2 diabetes is also very helpful for managing high cholesterol and high blood blood pressure.  - Studies showed that people with diabetes will benefit from a class of medications known as ACE inhibitors and statins.  Unless there are specific reasons not to be on these medications, the standard of care is to consider getting one from these groups of medications at an optimal doses.  These medications are generally considered safe and proven to help protect the heart and the kidneys.    - People with diabetes are encouraged to initiate and maintain regular follow-up with eye doctors, foot doctors, dentists , and if necessary heart and kidney doctors.     - It is highly recommended that people with diabetes quit smoking or stay away from smoking, and get yearly  flu vaccine and pneumonia vaccine at least every 5 years.  See above for additional recommendations on exercise, sleep, stress management , and healthy social connections.      

## 2021-12-08 ENCOUNTER — Other Ambulatory Visit: Payer: Self-pay | Admitting: Physician Assistant

## 2021-12-08 ENCOUNTER — Encounter: Payer: Self-pay | Admitting: Podiatry

## 2021-12-08 ENCOUNTER — Ambulatory Visit (INDEPENDENT_AMBULATORY_CARE_PROVIDER_SITE_OTHER): Payer: Medicare Other | Admitting: Podiatry

## 2021-12-08 DIAGNOSIS — M79674 Pain in right toe(s): Secondary | ICD-10-CM | POA: Diagnosis not present

## 2021-12-08 DIAGNOSIS — E1122 Type 2 diabetes mellitus with diabetic chronic kidney disease: Secondary | ICD-10-CM

## 2021-12-08 DIAGNOSIS — M79675 Pain in left toe(s): Secondary | ICD-10-CM | POA: Diagnosis not present

## 2021-12-08 DIAGNOSIS — N181 Chronic kidney disease, stage 1: Secondary | ICD-10-CM | POA: Diagnosis not present

## 2021-12-08 DIAGNOSIS — Z794 Long term (current) use of insulin: Secondary | ICD-10-CM | POA: Diagnosis not present

## 2021-12-08 DIAGNOSIS — B351 Tinea unguium: Secondary | ICD-10-CM | POA: Diagnosis not present

## 2021-12-09 ENCOUNTER — Encounter: Payer: Self-pay | Admitting: Family Medicine

## 2021-12-09 ENCOUNTER — Ambulatory Visit (INDEPENDENT_AMBULATORY_CARE_PROVIDER_SITE_OTHER): Payer: Medicare Other | Admitting: Family Medicine

## 2021-12-09 VITALS — BP 142/78 | HR 72 | Ht 65.0 in | Wt 187.1 lb

## 2021-12-09 DIAGNOSIS — H5462 Unqualified visual loss, left eye, normal vision right eye: Secondary | ICD-10-CM | POA: Diagnosis not present

## 2021-12-09 DIAGNOSIS — R519 Headache, unspecified: Secondary | ICD-10-CM

## 2021-12-09 DIAGNOSIS — R0989 Other specified symptoms and signs involving the circulatory and respiratory systems: Secondary | ICD-10-CM | POA: Diagnosis not present

## 2021-12-09 DIAGNOSIS — I10 Essential (primary) hypertension: Secondary | ICD-10-CM | POA: Diagnosis not present

## 2021-12-09 DIAGNOSIS — N181 Chronic kidney disease, stage 1: Secondary | ICD-10-CM | POA: Diagnosis not present

## 2021-12-09 DIAGNOSIS — N1831 Chronic kidney disease, stage 3a: Secondary | ICD-10-CM

## 2021-12-09 DIAGNOSIS — E1122 Type 2 diabetes mellitus with diabetic chronic kidney disease: Secondary | ICD-10-CM

## 2021-12-09 DIAGNOSIS — R200 Anesthesia of skin: Secondary | ICD-10-CM

## 2021-12-09 DIAGNOSIS — Z794 Long term (current) use of insulin: Secondary | ICD-10-CM

## 2021-12-09 MED ORDER — CLONIDINE HCL 0.1 MG PO TABS: 0.1000 mg | ORAL_TABLET | Freq: Every day | ORAL | 5 refills | Status: DC

## 2021-12-09 NOTE — Patient Instructions (Addendum)
F/U end August /early Sept , re eval blood pressure   You are referred for carotid doppler and to see Neurologist and to nephrology  Please reconsider vaccines that you DO need, we are here to encourage with those  Happy Birthday next week  It is important that you exercise regularly at least 30 minutes 5 times a week. If you develop chest pain, have severe difficulty breathing, or feel very tired, stop exercising immediately and seek medical attention   Thanks for choosing [facility], we consider it a privelige to serve you.

## 2021-12-10 ENCOUNTER — Ambulatory Visit (HOSPITAL_COMMUNITY)
Admission: RE | Admit: 2021-12-10 | Discharge: 2021-12-10 | Disposition: A | Discharge status: Home / Self Care | Payer: Medicare Other | Source: Ambulatory Visit | Attending: Family Medicine | Admitting: Family Medicine

## 2021-12-10 DIAGNOSIS — Z1231 Encounter for screening mammogram for malignant neoplasm of breast: Secondary | ICD-10-CM | POA: Insufficient documentation

## 2021-12-11 ENCOUNTER — Encounter: Payer: Self-pay | Admitting: Family Medicine

## 2021-12-11 DIAGNOSIS — R519 Headache, unspecified: Secondary | ICD-10-CM | POA: Insufficient documentation

## 2021-12-11 DIAGNOSIS — N1831 Chronic kidney disease, stage 3a: Secondary | ICD-10-CM | POA: Insufficient documentation

## 2021-12-11 DIAGNOSIS — N183 Chronic kidney disease, stage 3 unspecified: Secondary | ICD-10-CM | POA: Insufficient documentation

## 2021-12-11 NOTE — Assessment & Plan Note (Addendum)
Recent  acute temporary loss of vision ,  Needs carotid doppler to further evaluate

## 2021-12-11 NOTE — Assessment & Plan Note (Signed)
Joanna Brown is reminded of the importance of commitment to daily physical activity for 30 minutes or more, as able and the need to limit carbohydrate intake to 30 to 60 grams per meal to help with blood sugar control.   The need to take medication as prescribed, test blood sugar as directed, and to call between visits if there is a concern that blood sugar is uncontrolled is also discussed.   Joanna Brown is reminded of the importance of daily foot exam, annual eye examination, and good blood sugar, blood pressure and cholesterol control. Adequate thoughn sub optima;l control, no med change     Latest Ref Rng & Units 12/02/2021    9:25 AM 09/01/2021    9:32 AM 08/21/2021   10:31 PM 08/03/2021   10:23 AM 05/21/2021    3:50 PM  Diabetic Labs  HbA1c 0.0 - 7.0 % 7.5  7.1      Micro/Creat Ratio 0 - 29 mg/g creat    61    Chol 100 - 199 mg/dL    165    HDL >39 mg/dL    44    Calc LDL 0 - 99 mg/dL    90    Triglycerides 0 - 149 mg/dL    184    Creatinine 0.44 - 1.00 mg/dL   1.10   1.13       12/09/2021    1:07 PM 12/09/2021    1:06 PM 12/02/2021    9:13 AM 10/07/2021    9:17 AM 09/01/2021    1:55 PM 09/01/2021    9:20 AM 08/22/2021    1:00 AM  BP/Weight  Systolic BP 779 390 300 923 300 762 263  Diastolic BP 78 75 58 72 72 60 74  Wt. (Lbs)  187.12 186.2 188.4 189.2 188.2   BMI  31.14 kg/m2 30.99 kg/m2 31.35 kg/m2 31.48 kg/m2 31.32 kg/m2       Latest Ref Rng & Units 08/03/2021    9:20 AM 03/09/2021   12:00 AM  Foot/eye exam completion dates  Eye Exam No Retinopathy  No Retinopathy      Foot Form Completion  Done      This result is from an external source.

## 2021-12-11 NOTE — Assessment & Plan Note (Signed)
New bitemporal headaches, refer to Neurology for eval and management

## 2021-12-11 NOTE — Assessment & Plan Note (Addendum)
DASH diet and commitment to daily physical activity for a minimum of 30 minutes discussed and encouraged, as a part of hypertension management. The importance of attaining a healthy weight is also discussed. Sub optimal     12/09/2021    1:07 PM 12/09/2021    1:06 PM 12/02/2021    9:13 AM 10/07/2021    9:17 AM 09/01/2021    1:55 PM 09/01/2021    9:20 AM 08/22/2021    1:00 AM  BP/Weight  Systolic BP 831 517 616 073 710 626 948  Diastolic BP 78 75 58 72 72 60 74  Wt. (Lbs)  187.12 186.2 188.4 189.2 188.2   BMI  31.14 kg/m2 30.99 kg/m2 31.35 kg/m2 31.48 kg/m2 31.32 kg/m2

## 2021-12-11 NOTE — Progress Notes (Signed)
Joanna Brown     MRN: [id number]      DOB: 02-Jan-1941   HPI Joanna Brown is here for follow up and re-evaluation of chronic medical conditions, medication management and review of any available recent lab and radiology data.  Preventive health is updated, specifically  Cancer screening and Immunization.   C/o new headaches, bitemporal, and recently had acute loss of vision in left eye. This has resolved and she has already seen ophthalmology, also reports new facial numbness  ROS Denies recent fever or chills. Denies sinus pressure, nasal congestion, ear pain or sore throat. Denies chest congestion, productive cough or wheezing. Denies chest pains, palpitations and leg swelling Denies abdominal pain, nausea, vomiting,diarrhea or constipation.   Denies dysuria, frequency, hesitancy or incontinence. Denies uncontrolled  joint pain, swelling and limitation in mobility. Denies skin break down or rash.   PE  BP (!) 142/78 (BP Location: Left Arm, Cuff Size: Normal)   Pulse 72   Ht '5\' 5"'$  (1.651 m)   Wt 187 lb 1.9 oz (84.9 kg)   SpO2 98%   BMI 31.14 kg/m   Patient alert and oriented and in no cardiopulmonary distress.  HEENT: No facial asymmetry, EOMI,     Neck supple , carotid bruit  Chest: Clear to auscultation bilaterally.  CVS: S1, S2 systolic  murmur, no S3.Regular rate.  ABD: Soft non tender.   Ext: No edema  MS: Adequate ROM spine, shoulders, hips and knees.  Skin: Intact, no ulcerations or rash noted.  Psych: Good eye contact, normal affect. Memory intact not anxious or depressed appearing.  CNS: CN 2-12 intact, power,  normal throughout.no focal deficits noted.   Assessment & Plan  Carotid bruit Recent  acute temporary loss of vision ,  Needs carotid doppler to further evaluate  New onset of headaches New bitemporal headaches, refer to Neurology for eval and management  Type 2 diabetes mellitus with stage 1 chronic kidney disease, with long-term current  use of insulin ([place]) Joanna Brown is reminded of the importance of commitment to daily physical activity for 30 minutes or more, as able and the need to limit carbohydrate intake to 30 to 60 grams per meal to help with blood sugar control.   The need to take medication as prescribed, test blood sugar as directed, and to call between visits if there is a concern that blood sugar is uncontrolled is also discussed.   Joanna Brown is reminded of the importance of daily foot exam, annual eye examination, and good blood sugar, blood pressure and cholesterol control. Adequate thoughn sub optima;l control, no med change     Latest Ref Rng & Units 12/02/2021    9:25 AM 09/01/2021    9:32 AM 08/21/2021   10:31 PM 08/03/2021   10:23 AM 05/21/2021    3:50 PM  Diabetic Labs  HbA1c 0.0 - 7.0 % 7.5  7.1      Micro/Creat Ratio 0 - 29 mg/g creat    61    Chol 100 - 199 mg/dL    165    HDL >39 mg/dL    44    Calc LDL 0 - 99 mg/dL    90    Triglycerides 0 - 149 mg/dL    184    Creatinine 0.44 - 1.00 mg/dL   1.10   1.13       12/09/2021    1:07 PM 12/09/2021    1:06 PM 12/02/2021    9:13 AM 10/07/2021  9:17 AM 09/01/2021    1:55 PM 09/01/2021    9:20 AM 08/22/2021    1:00 AM  BP/Weight  Systolic BP 627 035 009 381 829 937 169  Diastolic BP 78 75 58 72 72 60 74  Wt. (Lbs)  187.12 186.2 188.4 189.2 188.2   BMI  31.14 kg/m2 30.99 kg/m2 31.35 kg/m2 31.48 kg/m2 31.32 kg/m2       Latest Ref Rng & Units 08/03/2021    9:20 AM 03/09/2021   12:00 AM  Foot/eye exam completion dates  Eye Exam No Retinopathy  No Retinopathy      Foot Form Completion  Done      This result is from an external source.        Stage 3a chronic kidney disease (CKD) [facility]) Refer Nephrology  For management

## 2021-12-11 NOTE — Assessment & Plan Note (Signed)
Refer Nephrology  For management

## 2021-12-12 NOTE — Progress Notes (Signed)
  Subjective:  Patient ID: Joanna Brown, female    DOB: 17-Sep-1940,  MRN: [id number]  Joanna Brown presents to clinic today for at risk foot care. Pt has h/o NIDDM with chronic kidney disease and painful, discolored, thick toenails which interfere with daily activities  Patient states blood glucose was 132 mg/dl today.  Last A1c was 7.0%.  New problem(s): None.   PCP is Fayrene Helper, MD , and last visit was  August 03, 2021  Allergies  Allergen Reactions   Senokot Wheat Bran [Wheat Bran]     ABDOMINAL CRAMPS   Spironolactone     Stomach problems, vision changes     Review of Systems: Negative except as noted in the HPI.  Objective: No changes noted in today's physical examination.  Vascular Examination: Vascular status intact b/l with palpable pedal pulses. CFT immediate b/l. No edema. No pain with calf compression b/l. Skin temperature gradient WNL b/l. No ischemia or gangrene noted b/l LE. No cyanosis or clubbing noted b/l LE.  Neurological Examination: Sensation grossly intact b/l with 10 gram monofilament. Vibratory sensation intact b/l. Protective sensation intact 5/5 intact bilaterally with 10g monofilament b/l.  Dermatological Examination: Pedal skin with normal turgor, texture and tone b/l. Toenails 1-5 b/l thick, discolored, elongated with subungual debris and pain on dorsal palpation. No hyperkeratotic lesions noted b/l.   Musculoskeletal Examination: Muscle strength 5/5 to b/l LE. HAV with bunion deformity noted b/l LE. Patient ambulates independent of any assistive aids.     Latest Ref Rng & Units 12/02/2021    9:25 AM 09/01/2021    9:32 AM 03/22/2021   11:30 AM 12/31/2020    9:47 AM  Hemoglobin A1C  Hemoglobin-A1c 0.0 - 7.0 % 7.5  7.1  7.4  7.8    Assessment/Plan: 1. Pain due to onychomycosis of toenails of both feet   2. Type 2 diabetes mellitus with stage 1 chronic kidney disease, with long-term current use of insulin ([place])     -Examined  patient. -Continue foot and shoe inspections daily. Monitor blood glucose per PCP/Endocrinologist's recommendations. -Mycotic toenails 1-5 bilaterally were debrided in length and girth with sterile nail nippers and dremel without incident. -Patient/POA to call should there be question/concern in the interim.   Return in about 3 months (around 03/10/2022).  Marzetta Board, DPM

## 2021-12-13 ENCOUNTER — Other Ambulatory Visit (HOSPITAL_COMMUNITY): Payer: Self-pay | Admitting: Family Medicine

## 2021-12-13 DIAGNOSIS — R928 Other abnormal and inconclusive findings on diagnostic imaging of breast: Secondary | ICD-10-CM

## 2021-12-14 DIAGNOSIS — Z794 Long term (current) use of insulin: Secondary | ICD-10-CM | POA: Diagnosis not present

## 2021-12-14 DIAGNOSIS — E1122 Type 2 diabetes mellitus with diabetic chronic kidney disease: Secondary | ICD-10-CM | POA: Diagnosis not present

## 2021-12-14 DIAGNOSIS — N181 Chronic kidney disease, stage 1: Secondary | ICD-10-CM | POA: Diagnosis not present

## 2021-12-15 ENCOUNTER — Telehealth: Payer: Self-pay | Admitting: "Endocrinology

## 2021-12-15 DIAGNOSIS — E049 Nontoxic goiter, unspecified: Secondary | ICD-10-CM

## 2021-12-15 DIAGNOSIS — I10 Essential (primary) hypertension: Secondary | ICD-10-CM

## 2021-12-15 DIAGNOSIS — E782 Mixed hyperlipidemia: Secondary | ICD-10-CM

## 2021-12-15 LAB — LIPID PANEL
Chol/HDL Ratio: 4.5 ratio — ABNORMAL HIGH (ref 0.0–4.4)
Cholesterol, Total: 159 mg/dL (ref 100–199)
HDL: 35 mg/dL — ABNORMAL LOW (ref >39)
LDL Chol Calc (NIH): 90 mg/dL (ref 0–99)
Triglycerides: 197 mg/dL — ABNORMAL HIGH (ref 0–149)
VLDL Cholesterol Cal: 34 mg/dL (ref 5–40)

## 2021-12-15 LAB — COMPREHENSIVE METABOLIC PANEL
ALT: 17 IU/L (ref 0–32)
AST: 22 IU/L (ref 0–40)
Albumin/Globulin Ratio: 1.4 (ref 1.2–2.2)
Albumin: 4.2 g/dL (ref 3.8–4.8)
Alkaline Phosphatase: 55 IU/L (ref 44–121)
BUN/Creatinine Ratio: 13 (ref 12–28)
BUN: 13 mg/dL (ref 8–27)
Bilirubin Total: 0.3 mg/dL (ref 0.0–1.2)
CO2: 26 mmol/L (ref 20–29)
Calcium: 9.8 mg/dL (ref 8.7–10.3)
Chloride: 100 mmol/L (ref 96–106)
Creatinine, Ser: 1.04 mg/dL — ABNORMAL HIGH (ref 0.57–1.00)
Globulin, Total: 2.9 g/dL (ref 1.5–4.5)
Glucose: 104 mg/dL — ABNORMAL HIGH (ref 70–99)
Potassium: 3.9 mmol/L (ref 3.5–5.2)
Sodium: 139 mmol/L (ref 134–144)
Total Protein: 7.1 g/dL (ref 6.0–8.5)
eGFR: 55 mL/min/{1.73_m2} — ABNORMAL LOW (ref >59)

## 2021-12-15 LAB — T4, FREE: Free T4: 1.23 ng/dL (ref 0.82–1.77)

## 2021-12-15 LAB — TSH: TSH: 2.21 u[IU]/mL (ref 0.450–4.500)

## 2021-12-15 NOTE — Telephone Encounter (Signed)
Orders placed.

## 2021-12-15 NOTE — Telephone Encounter (Signed)
Patient's labs came over that she completed them but her appt is not until November. Do you want to place new lab orders for that appt? I will call pt

## 2021-12-17 ENCOUNTER — Ambulatory Visit (HOSPITAL_COMMUNITY)
Admission: RE | Admit: 2021-12-17 | Discharge: 2021-12-17 | Disposition: A | Discharge status: Home / Self Care | Payer: Medicare Other | Source: Ambulatory Visit | Attending: Family Medicine | Admitting: Family Medicine

## 2021-12-17 DIAGNOSIS — R928 Other abnormal and inconclusive findings on diagnostic imaging of breast: Secondary | ICD-10-CM | POA: Diagnosis not present

## 2021-12-17 DIAGNOSIS — N6001 Solitary cyst of right breast: Secondary | ICD-10-CM | POA: Diagnosis not present

## 2021-12-21 ENCOUNTER — Ambulatory Visit (HOSPITAL_COMMUNITY)
Admission: RE | Admit: 2021-12-21 | Discharge: 2021-12-21 | Disposition: A | Discharge status: Home / Self Care | Payer: Medicare Other | Source: Ambulatory Visit | Attending: Family Medicine | Admitting: Family Medicine

## 2021-12-21 DIAGNOSIS — R0989 Other specified symptoms and signs involving the circulatory and respiratory systems: Secondary | ICD-10-CM | POA: Insufficient documentation

## 2021-12-21 DIAGNOSIS — I6523 Occlusion and stenosis of bilateral carotid arteries: Secondary | ICD-10-CM | POA: Diagnosis not present

## 2021-12-21 DIAGNOSIS — R200 Anesthesia of skin: Secondary | ICD-10-CM | POA: Diagnosis not present

## 2021-12-23 ENCOUNTER — Other Ambulatory Visit: Payer: Self-pay | Admitting: "Endocrinology

## 2021-12-23 DIAGNOSIS — E1122 Type 2 diabetes mellitus with diabetic chronic kidney disease: Secondary | ICD-10-CM

## 2021-12-28 ENCOUNTER — Encounter (HOSPITAL_COMMUNITY): Payer: Self-pay

## 2021-12-28 ENCOUNTER — Other Ambulatory Visit: Payer: Self-pay

## 2021-12-28 ENCOUNTER — Emergency Department (HOSPITAL_COMMUNITY): Payer: Medicare Other

## 2021-12-28 ENCOUNTER — Observation Stay (HOSPITAL_COMMUNITY)
Admission: EM | Admit: 2021-12-28 | Discharge: 2021-12-29 | Disposition: A | Discharge status: Home / Self Care | Payer: Medicare Other | Attending: Family Medicine | Admitting: Family Medicine

## 2021-12-28 DIAGNOSIS — I6501 Occlusion and stenosis of right vertebral artery: Secondary | ICD-10-CM | POA: Diagnosis not present

## 2021-12-28 DIAGNOSIS — K219 Gastro-esophageal reflux disease without esophagitis: Secondary | ICD-10-CM | POA: Diagnosis not present

## 2021-12-28 DIAGNOSIS — I129 Hypertensive chronic kidney disease with stage 1 through stage 4 chronic kidney disease, or unspecified chronic kidney disease: Secondary | ICD-10-CM | POA: Insufficient documentation

## 2021-12-28 DIAGNOSIS — R42 Dizziness and giddiness: Principal | ICD-10-CM | POA: Insufficient documentation

## 2021-12-28 DIAGNOSIS — Z794 Long term (current) use of insulin: Secondary | ICD-10-CM | POA: Diagnosis not present

## 2021-12-28 DIAGNOSIS — E1149 Type 2 diabetes mellitus with other diabetic neurological complication: Secondary | ICD-10-CM | POA: Diagnosis not present

## 2021-12-28 DIAGNOSIS — N181 Chronic kidney disease, stage 1: Secondary | ICD-10-CM | POA: Diagnosis not present

## 2021-12-28 DIAGNOSIS — Z7982 Long term (current) use of aspirin: Secondary | ICD-10-CM | POA: Diagnosis not present

## 2021-12-28 DIAGNOSIS — R2681 Unsteadiness on feet: Secondary | ICD-10-CM | POA: Diagnosis not present

## 2021-12-28 DIAGNOSIS — E1122 Type 2 diabetes mellitus with diabetic chronic kidney disease: Secondary | ICD-10-CM | POA: Diagnosis not present

## 2021-12-28 DIAGNOSIS — E782 Mixed hyperlipidemia: Secondary | ICD-10-CM | POA: Diagnosis not present

## 2021-12-28 DIAGNOSIS — I6522 Occlusion and stenosis of left carotid artery: Secondary | ICD-10-CM | POA: Diagnosis not present

## 2021-12-28 DIAGNOSIS — R2990 Unspecified symptoms and signs involving the nervous system: Secondary | ICD-10-CM | POA: Diagnosis not present

## 2021-12-28 DIAGNOSIS — Z79899 Other long term (current) drug therapy: Secondary | ICD-10-CM | POA: Diagnosis not present

## 2021-12-28 DIAGNOSIS — N1831 Chronic kidney disease, stage 3a: Secondary | ICD-10-CM

## 2021-12-28 DIAGNOSIS — I639 Cerebral infarction, unspecified: Secondary | ICD-10-CM | POA: Diagnosis present

## 2021-12-28 DIAGNOSIS — Z20822 Contact with and (suspected) exposure to covid-19: Secondary | ICD-10-CM | POA: Diagnosis not present

## 2021-12-28 DIAGNOSIS — G453 Amaurosis fugax: Secondary | ICD-10-CM | POA: Diagnosis not present

## 2021-12-28 DIAGNOSIS — I10 Essential (primary) hypertension: Secondary | ICD-10-CM | POA: Diagnosis present

## 2021-12-28 DIAGNOSIS — R471 Dysarthria and anarthria: Secondary | ICD-10-CM

## 2021-12-28 DIAGNOSIS — E785 Hyperlipidemia, unspecified: Secondary | ICD-10-CM

## 2021-12-28 DIAGNOSIS — R4781 Slurred speech: Secondary | ICD-10-CM | POA: Diagnosis not present

## 2021-12-28 DIAGNOSIS — Z95818 Presence of other cardiac implants and grafts: Secondary | ICD-10-CM | POA: Insufficient documentation

## 2021-12-28 DIAGNOSIS — Z7984 Long term (current) use of oral hypoglycemic drugs: Secondary | ICD-10-CM | POA: Insufficient documentation

## 2021-12-28 DIAGNOSIS — E1169 Type 2 diabetes mellitus with other specified complication: Secondary | ICD-10-CM

## 2021-12-28 DIAGNOSIS — H5462 Unqualified visual loss, left eye, normal vision right eye: Secondary | ICD-10-CM | POA: Diagnosis not present

## 2021-12-28 LAB — COMPREHENSIVE METABOLIC PANEL
ALT: 19 U/L (ref 0–44)
AST: 21 U/L (ref 15–41)
Albumin: 3.4 g/dL — ABNORMAL LOW (ref 3.5–5.0)
Alkaline Phosphatase: 44 U/L (ref 38–126)
Anion gap: 8 (ref 5–15)
BUN: 24 mg/dL — ABNORMAL HIGH (ref 8–23)
CO2: 27 mmol/L (ref 22–32)
Calcium: 9.1 mg/dL (ref 8.9–10.3)
Chloride: 100 mmol/L (ref 98–111)
Creatinine, Ser: 1.27 mg/dL — ABNORMAL HIGH (ref 0.44–1.00)
GFR, Estimated: 43 mL/min — ABNORMAL LOW (ref >60)
Glucose, Bld: 223 mg/dL — ABNORMAL HIGH (ref 70–99)
Potassium: 3.4 mmol/L — ABNORMAL LOW (ref 3.5–5.1)
Sodium: 135 mmol/L (ref 135–145)
Total Bilirubin: 0.3 mg/dL (ref 0.3–1.2)
Total Protein: 6.5 g/dL (ref 6.5–8.1)

## 2021-12-28 LAB — DIFFERENTIAL
Abs Immature Granulocytes: 0.02 10*3/uL (ref 0.00–0.07)
Basophils Absolute: 0 10*3/uL (ref 0.0–0.1)
Basophils Relative: 1 %
Eosinophils Absolute: 0.2 10*3/uL (ref 0.0–0.5)
Eosinophils Relative: 3 %
Immature Granulocytes: 0 %
Lymphocytes Relative: 38 %
Lymphs Abs: 2.5 10*3/uL (ref 0.7–4.0)
Monocytes Absolute: 0.5 10*3/uL (ref 0.1–1.0)
Monocytes Relative: 8 %
Neutro Abs: 3.3 10*3/uL (ref 1.7–7.7)
Neutrophils Relative %: 50 %

## 2021-12-28 LAB — CBC
HCT: 33.3 % — ABNORMAL LOW (ref 36.0–46.0)
Hemoglobin: 11.3 g/dL — ABNORMAL LOW (ref 12.0–15.0)
MCH: 28.9 pg (ref 26.0–34.0)
MCHC: 33.9 g/dL (ref 30.0–36.0)
MCV: 85.2 fL (ref 80.0–100.0)
Platelets: 122 10*3/uL — ABNORMAL LOW (ref 150–400)
RBC: 3.91 MIL/uL (ref 3.87–5.11)
RDW: 13.6 % (ref 11.5–15.5)
WBC: 6.6 10*3/uL (ref 4.0–10.5)
nRBC: 0 % (ref 0.0–0.2)

## 2021-12-28 LAB — RAPID URINE DRUG SCREEN, HOSP PERFORMED
Amphetamines: NOT DETECTED
Barbiturates: NOT DETECTED
Benzodiazepines: NOT DETECTED
Cocaine: NOT DETECTED
Opiates: NOT DETECTED
Tetrahydrocannabinol: POSITIVE — AB

## 2021-12-28 LAB — URINALYSIS, ROUTINE W REFLEX MICROSCOPIC
Bacteria, UA: NONE SEEN
Bilirubin Urine: NEGATIVE
Glucose, UA: NEGATIVE mg/dL
Hgb urine dipstick: NEGATIVE
Ketones, ur: NEGATIVE mg/dL
Nitrite: NEGATIVE
Protein, ur: NEGATIVE mg/dL
Specific Gravity, Urine: 1.021 (ref 1.005–1.030)
pH: 7 (ref 5.0–8.0)

## 2021-12-28 LAB — PROTIME-INR
INR: 1.1 (ref 0.8–1.2)
Prothrombin Time: 13.6 seconds (ref 11.4–15.2)

## 2021-12-28 LAB — RESP PANEL BY RT-PCR (FLU A&B, COVID) ARPGX2
Influenza A by PCR: NEGATIVE
Influenza B by PCR: NEGATIVE
SARS Coronavirus 2 by RT PCR: NEGATIVE

## 2021-12-28 LAB — GLUCOSE, CAPILLARY: Glucose-Capillary: 168 mg/dL — ABNORMAL HIGH (ref 70–99)

## 2021-12-28 LAB — CBG MONITORING, ED: Glucose-Capillary: 244 mg/dL — ABNORMAL HIGH (ref 70–99)

## 2021-12-28 LAB — ETHANOL: Alcohol, Ethyl (B): <10 mg/dL (ref <10)

## 2021-12-28 LAB — APTT: aPTT: 27 seconds (ref 24–36)

## 2021-12-28 MED ORDER — POLYETHYLENE GLYCOL 3350 17 G PO PACK
17.0000 g | PACK | Freq: Every day | ORAL | Status: DC
  Administered 2021-12-28: 17 g via ORAL
  Filled 2021-12-28: qty 1

## 2021-12-28 MED ORDER — ACETAMINOPHEN 160 MG/5ML PO SOLN: 650.0000 mg | ORAL | Status: DC | PRN

## 2021-12-28 MED ORDER — CLOPIDOGREL BISULFATE 75 MG PO TABS
75.0000 mg | ORAL_TABLET | Freq: Every day | ORAL | Status: DC
Start: 2021-12-29 — End: 2021-12-29
  Administered 2021-12-29: 75 mg via ORAL
  Filled 2021-12-28: qty 1

## 2021-12-28 MED ORDER — PRAVASTATIN SODIUM 40 MG PO TABS
80.0000 mg | ORAL_TABLET | Freq: Every day | ORAL | Status: DC
  Administered 2021-12-29: 80 mg via ORAL
  Filled 2021-12-28: qty 2

## 2021-12-28 MED ORDER — EZETIMIBE 10 MG PO TABS
10.0000 mg | ORAL_TABLET | Freq: Every day | ORAL | Status: DC
  Administered 2021-12-29: 10 mg via ORAL
  Filled 2021-12-28: qty 1

## 2021-12-28 MED ORDER — IOHEXOL 350 MG/ML SOLN
100.0000 mL | Freq: Once | INTRAVENOUS | Status: AC | PRN
  Administered 2021-12-28: 100 mL via INTRAVENOUS

## 2021-12-28 MED ORDER — BRIMONIDINE TARTRATE 0.15 % OP SOLN
1.0000 [drp] | Freq: Two times a day (BID) | OPHTHALMIC | Status: DC
  Administered 2021-12-28 – 2021-12-29 (×2): 1 [drp] via OPHTHALMIC
  Filled 2021-12-28: qty 5

## 2021-12-28 MED ORDER — ACETAMINOPHEN 325 MG PO TABS: 650.0000 mg | ORAL_TABLET | ORAL | Status: DC | PRN

## 2021-12-28 MED ORDER — PANTOPRAZOLE SODIUM 40 MG PO TBEC
40.0000 mg | DELAYED_RELEASE_TABLET | Freq: Every day | ORAL | Status: DC
  Administered 2021-12-29: 40 mg via ORAL
  Filled 2021-12-28: qty 1

## 2021-12-28 MED ORDER — INSULIN DETEMIR 100 UNIT/ML ~~LOC~~ SOLN
25.0000 [IU] | Freq: Every day | SUBCUTANEOUS | Status: DC
  Administered 2021-12-28: 25 [IU] via SUBCUTANEOUS
  Filled 2021-12-28 (×2): qty 0.25

## 2021-12-28 MED ORDER — INSULIN ASPART 100 UNIT/ML IJ SOLN
0.0000 [IU] | Freq: Three times a day (TID) | INTRAMUSCULAR | Status: DC
  Administered 2021-12-29: 2 [IU] via SUBCUTANEOUS

## 2021-12-28 MED ORDER — INSULIN ASPART 100 UNIT/ML IJ SOLN: 0.0000 [IU] | Freq: Every day | INTRAMUSCULAR | Status: DC

## 2021-12-28 MED ORDER — HEPARIN SODIUM (PORCINE) 5000 UNIT/ML IJ SOLN
5000.0000 [IU] | Freq: Three times a day (TID) | INTRAMUSCULAR | Status: DC
  Administered 2021-12-28 – 2021-12-29 (×2): 5000 [IU] via SUBCUTANEOUS
  Filled 2021-12-28 (×2): qty 1

## 2021-12-28 MED ORDER — ASPIRIN 81 MG PO TBEC
81.0000 mg | DELAYED_RELEASE_TABLET | ORAL | Status: DC
  Administered 2021-12-29: 81 mg via ORAL
  Filled 2021-12-28: qty 1

## 2021-12-28 MED ORDER — STROKE: EARLY STAGES OF RECOVERY BOOK: Freq: Once | Status: AC

## 2021-12-28 MED ORDER — LATANOPROST 0.005 % OP SOLN
1.0000 [drp] | Freq: Every day | OPHTHALMIC | Status: DC
Start: 2021-12-28 — End: 2021-12-29
  Administered 2021-12-28: 1 [drp] via OPHTHALMIC
  Filled 2021-12-28: qty 2.5

## 2021-12-28 MED ORDER — ACETAMINOPHEN 650 MG RE SUPP: 650.0000 mg | RECTAL | Status: DC | PRN

## 2021-12-28 NOTE — ED Notes (Addendum)
Code Stroke called and Beeped out @ 1555 to CT and Lab.

## 2021-12-28 NOTE — Progress Notes (Signed)
Neurologist, Dr Su Monks joins the telemedicine cart at [phone]

## 2021-12-28 NOTE — H&P (Signed)
History and Physical    Patient: Joanna Brown DOB: 02-27-1941 DOA: 12/28/2021 DOS: the patient was seen and examined on 12/28/2021 PCP: Fayrene Helper, MD  Patient coming from: Home  Chief Complaint: off balance and slurred speech  HPI: Joanna Brown is a 81 y.o. female with medical history significant of diabetes mellitus type 2, GERD, glaucoma, hyperlipidemia, hypertension, thyroid disease, transcatheter aortic valve replacement secondary to aortic stenosis, and more presents to the ED with a chief complaint of neck pain and ataxia.  Patient reports that this has been going on for a couple weeks.  It comes in episodes will last for minutes and then goes away.  Became acutely worse on day of presentation at 2 PM.  Patient had just finished fixing a meal when she had right neck pain.  She describes it as a pressure/burning.  Pain is resolved now.  She is not sure what made it go away.  She has no history of torticollis or muscle spasms in her neck.  She did not have any difficulty swallowing.  She did report difficulty speaking.  She felt like she could not get the words out.  She is not sure if her speech was slurred.  Patient reports that she cannot walk because she felt off balance like she might pass out.  She reports that she has had some dizziness episodes, and was recently started a work-up outpatient with ultrasound carotids done on August 8.  This did not feel like dizziness, but more like she might pass out.  Patient reports she had a headache with that it was located over her left temple and down her face.  She still feels a stiffness in her head but the pain is gone per her report.  She was nauseous but had no vomiting.  Patient reports that her last bowel movement was on the day of presentation, and her last meal was at the time of symptom onset.  She is also had dysuria per her report and UA is pending.  Patient has no other complaints at this time.  She feels that  she is mostly back to normal at this point.  Patient does not smoke, does not drink, does not use illicit drugs.  Patient is vaccinated for COVID.  Patient is full code. Review of Systems: As mentioned in the history of present illness. All other systems reviewed and are negative. Past Medical History:  Diagnosis Date   Anginal pain ([place])    Arthritis    OA   Diabetes mellitus    Dyspnea    Female bladder prolapse    GERD (gastroesophageal reflux disease)    Glaucoma    Hyperlipidemia    Hypertension    echo and stress 4/10 reports on chart, EKG ` LOV 9/12 on chart   S/P TAVR (transcatheter aortic valve replacement) 03/23/2021   Edwards 58m S3U TF approach with Dr. MAngelena Formand Dr. BCyndia Bent  Severe aortic stenosis    Thyroid disease    Past Surgical History:  Procedure Laterality Date   ABDOMINAL HYSTERECTOMY     ANTERIOR AND POSTERIOR REPAIR  04/26/2011   Procedure: ANTERIOR (CYSTOCELE) AND POSTERIOR REPAIR (RECTOCELE);  Surgeon: SReece Packer MD;  Location: [facility];  Service: Urology;  Laterality: N/A;   BIOPSY  05/25/2020   Procedure: BIOPSY;  Surgeon: DDoran Stabler MD;  Location: [facility];  Service: Gastroenterology;;   CATARACT EXTRACTION Bilateral    with IOL   CHOLECYSTECTOMY  2009   COLONOSCOPY N/A 12/02/2013   three colon polyps removed, small internal hemorrhoids. Hyperplastic polyps   COLONOSCOPY N/A 11/20/2019   pancolonic diverticulosis, two 10-11 mm polyps in ascending colon, one 5 mm polyp in cecum, ascending colon with superficially invasive adenocarcinoma arising in background of sessile serrated polyps with low and high grade cytologic dysplasia.   COLONOSCOPY     COLONOSCOPY W/ POLYPECTOMY     COLONOSCOPY WITH PROPOFOL N/A 05/25/2020   diverticulosis in right colon, redundant colon. Caution warranted on future colonoscopy in light of age, cardiac condition, challenging anatomy.    DILATION AND CURETTAGE OF UTERUS     pt states this was in the  1970's   ESOPHAGOGASTRODUODENOSCOPY (EGD) WITH PROPOFOL N/A 05/25/2020   Grade 1 esophageal varices, single mucosal nodule in stomach s/p biopsy. (hyperplastic)>    LEFT HEART CATH  09/10/2008   normal coronary arteries, normal LV systolic function, EF 94% (Dr. [name])   [place] Right 1997   under arm   NM MYOCAR PERF WALL MOTION  2010   dipyridamole - mild-mod in intenstiy perfusion defect in mid anterior, mid anteroseptal wall, EF 70%   OVARY SURGERY     bilateral tumors removed   POLYPECTOMY  11/20/2019   Procedure: POLYPECTOMY;  Surgeon: Daneil Dolin, MD;  [facility]: [facility];  Service: Endoscopy;;  hot and cold snare cecal polyp, and asending polyps x 2   RIGHT/LEFT HEART CATH AND CORONARY ANGIOGRAPHY N/A 10/02/2020   Procedure: RIGHT/LEFT HEART CATH AND CORONARY ANGIOGRAPHY;  Surgeon: Martinique, Peter M, MD;  Location: [place] [facility];  Service: Cardiovascular;  Laterality: N/A;   THYROIDECTOMY     THYROIDECTOMY  02/2008   TRANSCATHETER AORTIC VALVE REPLACEMENT, TRANSFEMORAL N/A 03/23/2021   Procedure: TRANSCATHETER AORTIC VALVE REPLACEMENT, TRANSFEMORAL;  Surgeon: Burnell Blanks, MD;  Location: [place];  Service: Open Heart Surgery;  Laterality: N/A;   TRANSTHORACIC ECHOCARDIOGRAM  08/2011   EF=>55%, mild conc LVH; trace MR; mild TR; mild-mod AV calcification with mild valvular AV stenosis   VAGINAL PROLAPSE REPAIR  04/26/2011   Procedure: VAGINAL VAULT SUSPENSION;  Surgeon: Reece Packer, MD;  Location: [facility];  Service: Urology;  Laterality: N/A;  with Graft  10x6   Social History:  reports that she has never smoked. She has never used smokeless tobacco. She reports that she does not currently use alcohol. She reports that she does not use drugs.  Allergies  Allergen Reactions   Senokot Wheat Bran [Wheat Bran]     ABDOMINAL CRAMPS   Spironolactone     Stomach problems, vision changes     Family History  Problem Relation Age  of Onset   Stomach cancer Mother 14   Heart disease Mother 63       heart disease   Heart disease Father 13       MI   Stroke Maternal Grandfather    Heart attack Paternal Grandfather    Hypertension Brother    Bone cancer Brother    Hypertension Sister    Liver cancer Sister    Hypertension Sister    Hypertension Child    Colon cancer Neg Hx    Colon polyps Neg Hx    Pancreatic cancer Neg Hx    Esophageal cancer Neg Hx    Rectal cancer Neg Hx     Prior to Admission medications   Medication Sig Start Date End Date Taking? Authorizing Provider  acetaminophen (TYLENOL) 500 MG  tablet Take 500-1,000 mg by mouth every 6 (six) hours as needed (pain).   Yes [provider]  ALPHAGAN P 0.1 % SOLN Place 1 drop into both eyes 2 (two) times daily. 09/16/19  Yes [provider]  amLODipine (NORVASC) 10 MG tablet TAKE 1 TABLET(10 MG) BY MOUTH EVERY MORNING Patient taking differently: Take 10 mg by mouth daily. 06/29/21  Yes Fayrene Helper, MD  Ascorbic Acid (VITAMIN C) 1000 MG tablet Take 1,000 mg by mouth 4 (four) times a week. AT NIGHT   Yes [provider]  aspirin EC 81 MG tablet Take 81 mg by mouth every other day. In the morning. Swallow whole.   Yes [provider]  benazepril (LOTENSIN) 40 MG tablet TAKE 1 TABLET(40 MG) BY MOUTH DAILY Patient taking differently: Take 40 mg by mouth daily. 10/22/21  Yes Paseda, Dewaine Conger, FNP  cholecalciferol (VITAMIN D3) 25 MCG (1000 UT) tablet Take 1,000 Units by mouth in the morning.   Yes [provider]  cloNIDine (CATAPRES) 0.1 MG tablet Take 1 tablet (0.1 mg total) by mouth daily. 12/09/21  Yes Fayrene Helper, MD  ezetimibe (ZETIA) 10 MG tablet Take 1 tablet (10 mg total) by mouth daily. 10/23/20  Yes Meng, Isaac Laud, PA  glipiZIDE (GLUCOTROL XL) 5 MG 24 hr tablet TAKE 1 TABLET(5 MG) BY MOUTH DAILY WITH BREAKFAST 10/20/21  Yes Nida, Marella Chimes, MD  insulin isophane & regular human KwikPen (HUMULIN  70/30 KWIKPEN) (70-30) 100 UNIT/ML KwikPen 60 units in the am and 40 units at night 11/17/21  Yes Nida, Marella Chimes, MD  latanoprost (XALATAN) 0.005 % ophthalmic solution Place 1 drop into both eyes at bedtime.  02/18/19  Yes [provider]  linaclotide Rolan Lipa) 290 MCG CAPS capsule Take 1 capsule (290 mcg total) by mouth daily before breakfast. 08/10/21  Yes Annitta Needs, NP  metFORMIN (GLUCOPHAGE) 500 MG tablet Take 1 tablet (500 mg total) by mouth daily with breakfast. 12/02/21  Yes Nida, Marella Chimes, MD  Multiple Vitamin (MULTIVITAMIN WITH MINERALS) TABS tablet Take 1 tablet by mouth daily.   Yes [provider]  Omega-3 Fatty Acids (FISH OIL) 1200 MG CAPS Take 1,200 mg by mouth every morning.   Yes [provider]  pantoprazole (PROTONIX) 40 MG tablet TAKE 1 TABLET(40 MG) BY MOUTH DAILY 30 MINUTES BEFORE A MEAL Patient taking differently: Take 40 mg by mouth daily. 10/18/21  Yes Fayrene Helper, MD  polyethylene glycol (MIRALAX) 17 g packet Take 17 g by mouth at bedtime. 05/05/20  Yes Noralyn Pick, NP  pravastatin (PRAVACHOL) 80 MG tablet TAKE 1 TABLET(80 MG) BY MOUTH DAILY Patient taking differently: Take 80 mg by mouth daily. 04/20/21  Yes Fayrene Helper, MD  triamterene-hydrochlorothiazide (MAXZIDE) 75-50 MG tablet TAKE 1 TABLET BY MOUTH DAILY 04/20/21 12/28/21 Yes Paseda, Dewaine Conger, FNP  Blood Glucose Monitoring Suppl (ACCU-CHEK GUIDE) w/Device KIT 1 each by Does not apply route 4 (four) times daily. 05/08/19   Cassandria Anger, MD  glucose blood (ACCU-CHEK GUIDE) test strip USE TO CHECK BLOOD TWICE DAILY 10/12/21   Nida, Marella Chimes, MD  Insulin Pen Needle (B-D ULTRAFINE III SHORT PEN) 31G X 8 MM MISC USE WITH INSULIN TWICE DAILY AS DIRECTED 12/23/21   Cassandria Anger, MD    Physical Exam: Vitals:   12/28/21 1700 12/28/21 1730 12/28/21 1800 12/28/21 1830  BP: 121/65 130/64 136/62 (!) 110/52  Pulse: 71 72 66 64  Resp: '13  19 16 ' Marland Kitchen)  23  Temp:      TempSrc:      SpO2: 100% 100% 99% 97%  Weight:      Height:       1.  General: Patient lying supine in bed,  no acute distress   2. Psychiatric: Alert and oriented x 3, mood and behavior normal for situation, pleasant and cooperative with exam   3. Neurologic: NIHSS   1a Level of Conscious.: 0 1b LOC Questions: 0 1c LOC Commands: 0 2 Best Gaze: 0 3 Visual: 0 4 Facial Palsy: 0 5a Motor Arm - left: 0 5b Motor Arm - Right: 0 6a Motor Leg - Left: 0 6b Motor Leg - Right: 1 7 Limb Ataxia: 0 8 Sensory: 1 9 Best Language: 0 10 Dysarthria: 0 11 Extinct. and Inatten.: 0   TOTAL: 2  4. HEENMT:  Head is atraumatic, normocephalic, pupils reactive to light, neck is supple, trachea is midline, mucous membranes are moist   5. Respiratory : Lungs are clear to auscultation bilaterally without wheezing, rhonchi, rales, no cyanosis, no increase in work of breathing or accessory muscle use   6. Cardiovascular : Heart rate normal, rhythm is regular, murmur present, no rubs or gallops, no peripheral edema, peripheral pulses palpated   7. Gastrointestinal:  Abdomen is soft, nondistended, nontender to palpation bowel sounds active, no masses or organomegaly palpated   8. Skin:  Skin is warm, dry and intact without rashes, acute lesions, or ulcers on limited exam   9.Musculoskeletal:  No acute deformities or trauma, no asymmetry in tone, no peripheral edema, peripheral pulses palpated, no tenderness to palpation in the extremities  Data Reviewed: In the ED Temp 98.5, heart rate 64-74, respiratory rate 13-23, blood pressure 110/52-136/73, satting at 97% No leukocytosis with a white blood cell count of 6.6, hemoglobin 11.3 Slight hypokalemia at 3.4 Creatinine is close to baseline at 1.27 with baseline being 1.1 CT head shows no evidence of cord infarction.  For millimeter penumbra in left temporal lobe is likely artifactual CTA head shows no large vessel  occlusion Initial NIHSS was 5, at admission NIHSS is 2 Neuro saw patient recommends 48 hours of permissive hypertension, 3 weeks of aspirin and Plavix, TTE, A1c, LDL On August 8 patient had ultrasound carotid done that showed minimal plaque with no hemodynamically significant stenosis by duplex criteria Admission requested for stroke work-up UA ordered but still pending  Assessment and Plan: * Stroke (cerebrum) ([place]) -CT head shows no acute intracranial abnormalities; There is mention of 30m penumbra in Left temporal, but likely to be artifact -CTA shows no large vessel occlusion -[place]oreacarotids done 12/21/21 and shows minimal heterogenous plaque without hemodynamically significant stenosis -Initial NIHSS 5 -NIHSS at admission 2 -Tele-neuro rec's admission for stroke workup  Last Hgb A1C 26 days ago 7.5  MRI in AM  Echo in AM  q 4H neuro checks  Lipid panel in AM  Asa and plavix  PT/OT/SLP in AM  All above are ordered -continue to monitor on tele    GERD (gastroesophageal reflux disease) -Continue protonix  Essential hypertension -permissive HTN for 48 hours, to end at 1400 8/17 -Hold antihypertensives -BP goal < 220/110 -Continue to monitor   HLD (hyperlipidemia) -Continue statin and Zetia  Type 2 diabetes mellitus with stage 1 chronic kidney disease, with long-term current use of insulin (HCC) -60 units 70/30 at home -Continue 25 units long acting -Sliding scale correction -Carb modified diet -Continue to monitor CBGs  Advance Care Planning:   Code Status: Prior FULL  Consults: Neuro  Family Communication: Husband at bedside  Severity of Illness: The appropriate patient status for this patient is OBSERVATION. Observation status is judged to be reasonable and necessary in order to provide the required intensity of service to ensure the patient's safety. The patient's presenting symptoms, physical exam findings, and initial radiographic and laboratory data in  the context of their medical condition is felt to place them at decreased risk for further clinical deterioration. Furthermore, it is anticipated that the patient will be medically stable for discharge from the hospital within 2 midnights of admission.   Author: Rolla Plate, DO 12/28/2021 8:01 PM  For on call review [email or web address].

## 2021-12-28 NOTE — ED Triage Notes (Signed)
Patient reports that she noticed that her balance is off around 1430 with right side neck pain as well as dizziness. States that she is stumbling. States that she developed difficulty speaking two weeks ago or so. MD called to triage to assess. Code stroke activated.

## 2021-12-28 NOTE — Progress Notes (Signed)
Patient back from CT department 

## 2021-12-28 NOTE — Assessment & Plan Note (Signed)
Continue protonix  

## 2021-12-28 NOTE — Progress Notes (Signed)
Patient admitted to unit, transferred via W/C from ED to room 336. Patient placed om telemetry, able to ambulate to bathroom inside room with steady gait noted stand-by assist only. Patient able to pass swallow screen with thin liquids, medications swallowed without incident, VS stable, Nihhs scale followed.

## 2021-12-28 NOTE — ED Notes (Signed)
Pt in CT at this time.

## 2021-12-28 NOTE — ED Provider Notes (Signed)
[facility] EMERGENCY DEPARTMENT Provider Note   CSN: [id number] Arrival date & time: 12/28/21  1523  An emergency department physician performed an initial assessment on this suspected stroke patient at 1555.  History {Add pertinent medical, surgical, social history, OB history to HPI:1} No chief complaint on file.   Joanna Brown is a 81 y.o. female.  Patient with hypertension and diabetes.  She presents with some dizziness.   Extremity Weakness       Home Medications Prior to Admission medications   Medication Sig Start Date End Date Taking? Authorizing Provider  acetaminophen (TYLENOL) 500 MG tablet Take 500-1,000 mg by mouth every 6 (six) hours as needed (pain).   Yes [provider]  ALPHAGAN P 0.1 % SOLN Place 1 drop into both eyes 2 (two) times daily. 09/16/19  Yes [provider]  amLODipine (NORVASC) 10 MG tablet TAKE 1 TABLET(10 MG) BY MOUTH EVERY MORNING Patient taking differently: Take 10 mg by mouth daily. 06/29/21  Yes Fayrene Helper, MD  Ascorbic Acid (VITAMIN C) 1000 MG tablet Take 1,000 mg by mouth 4 (four) times a week. AT NIGHT   Yes [provider]  aspirin EC 81 MG tablet Take 81 mg by mouth every other day. In the morning. Swallow whole.   Yes [provider]  benazepril (LOTENSIN) 40 MG tablet TAKE 1 TABLET(40 MG) BY MOUTH DAILY Patient taking differently: Take 40 mg by mouth daily. 10/22/21  Yes Paseda, Dewaine Conger, FNP  cholecalciferol (VITAMIN D3) 25 MCG (1000 UT) tablet Take 1,000 Units by mouth in the morning.   Yes [provider]  cloNIDine (CATAPRES) 0.1 MG tablet Take 1 tablet (0.1 mg total) by mouth daily. 12/09/21  Yes Fayrene Helper, MD  ezetimibe (ZETIA) 10 MG tablet Take 1 tablet (10 mg total) by mouth daily. 10/23/20  Yes Meng, Isaac Laud, PA  glipiZIDE (GLUCOTROL XL) 5 MG 24 hr tablet TAKE 1 TABLET(5 MG) BY MOUTH DAILY WITH BREAKFAST 10/20/21  Yes Nida, Marella Chimes, MD  insulin isophane &  regular human KwikPen (HUMULIN 70/30 KWIKPEN) (70-30) 100 UNIT/ML KwikPen 60 units in the am and 40 units at night 11/17/21  Yes Nida, Marella Chimes, MD  latanoprost (XALATAN) 0.005 % ophthalmic solution Place 1 drop into both eyes at bedtime.  02/18/19  Yes [provider]  linaclotide Rolan Lipa) 290 MCG CAPS capsule Take 1 capsule (290 mcg total) by mouth daily before breakfast. 08/10/21  Yes Annitta Needs, NP  metFORMIN (GLUCOPHAGE) 500 MG tablet Take 1 tablet (500 mg total) by mouth daily with breakfast. 12/02/21  Yes Nida, Marella Chimes, MD  Multiple Vitamin (MULTIVITAMIN WITH MINERALS) TABS tablet Take 1 tablet by mouth daily.   Yes [provider]  Omega-3 Fatty Acids (FISH OIL) 1200 MG CAPS Take 1,200 mg by mouth every morning.   Yes [provider]  pantoprazole (PROTONIX) 40 MG tablet TAKE 1 TABLET(40 MG) BY MOUTH DAILY 30 MINUTES BEFORE A MEAL Patient taking differently: Take 40 mg by mouth daily. 10/18/21  Yes Fayrene Helper, MD  polyethylene glycol (MIRALAX) 17 g packet Take 17 g by mouth at bedtime. 05/05/20  Yes Noralyn Pick, NP  pravastatin (PRAVACHOL) 80 MG tablet TAKE 1 TABLET(80 MG) BY MOUTH DAILY Patient taking differently: Take 80 mg by mouth daily. 04/20/21  Yes Fayrene Helper, MD  triamterene-hydrochlorothiazide (MAXZIDE) 75-50 MG tablet TAKE 1 TABLET BY MOUTH DAILY 04/20/21 12/28/21 Yes Paseda, Dewaine Conger, FNP  Blood Glucose Monitoring Suppl (ACCU-CHEK  GUIDE) w/Device KIT 1 each by Does not apply route 4 (four) times daily. 05/08/19   Cassandria Anger, MD  glucose blood (ACCU-CHEK GUIDE) test strip USE TO CHECK BLOOD TWICE DAILY 10/12/21   Nida, Marella Chimes, MD  Insulin Pen Needle (B-D ULTRAFINE III SHORT PEN) 31G X 8 MM MISC USE WITH INSULIN TWICE DAILY AS DIRECTED 12/23/21   Nida, Marella Chimes, MD      Allergies    Senokot wheat bran [wheat bran] and Spironolactone    Review of Systems   Review of Systems   Musculoskeletal:  Positive for extremity weakness.    Physical Exam Updated Vital Signs BP (!) 110/52   Pulse 64   Temp 98.5 F (36.9 C) (Oral)   Resp (!) 23   Ht '5\' 5"'  (1.651 m)   Wt 85.1 kg   SpO2 97%   BMI 31.23 kg/m  Physical Exam  ED Results / Procedures / Treatments   Labs (all labs ordered are listed, but only abnormal results are displayed) Labs Reviewed  CBC - Abnormal; Notable for the following components:      Result Value   Hemoglobin 11.3 (*)    HCT 33.3 (*)    Platelets 122 (*)    All other components within normal limits  COMPREHENSIVE METABOLIC PANEL - Abnormal; Notable for the following components:   Potassium 3.4 (*)    Glucose, Bld 223 (*)    BUN 24 (*)    Creatinine, Ser 1.27 (*)    Albumin 3.4 (*)    GFR, Estimated 43 (*)    All other components within normal limits  CBG MONITORING, ED - Abnormal; Notable for the following components:   Glucose-Capillary 244 (*)    All other components within normal limits  RESP PANEL BY RT-PCR (FLU A&B, COVID) ARPGX2  ETHANOL  PROTIME-INR  APTT  DIFFERENTIAL  RAPID URINE DRUG SCREEN, HOSP PERFORMED  URINALYSIS, ROUTINE W REFLEX MICROSCOPIC    EKG None  Radiology CT ANGIO HEAD NECK W WO CM W PERF (CODE STROKE)  Result Date: 12/28/2021 CLINICAL DATA:  Neuro deficit, acute, stroke suspected dizziness, off balance, slurred speech EXAM: CT ANGIOGRAPHY HEAD AND NECK CT PERFUSION BRAIN TECHNIQUE: Multidetector CT imaging of the head and neck was performed using the standard protocol during bolus administration of intravenous contrast. Multiplanar CT image reconstructions and MIPs were obtained to evaluate the vascular anatomy. Carotid stenosis measurements (when applicable) are obtained utilizing NASCET criteria, using the distal internal carotid diameter as the denominator. Multiphase CT imaging of the brain was performed following IV bolus contrast injection. Subsequent parametric perfusion maps were calculated  using RAPID software. RADIATION DOSE REDUCTION: This exam was performed according to the departmental dose-optimization program which includes automated exposure control, adjustment of the mA and/or kV according to patient size and/or use of iterative reconstruction technique. CONTRAST:  165m OMNIPAQUE IOHEXOL 350 MG/ML SOLN COMPARISON:  None Available. FINDINGS: CTA NECK Aortic arch: Mild calcified plaque. Great vessel origins are patent. Right carotid system: Patent.  No stenosis. Left carotid system: Patent. Minor calcified plaque along the common carotid and proximal ICA without stenosis. Vertebral arteries: Patent and codominant. Mild plaque at the right vertebral origin without stenosis. Skeleton: Advanced degenerative changes of the included spine. Other neck: Enlarged, multinodular thyroid previously evaluated by ultrasound. Left thyroidectomy. Upper chest: No apical lung mass. Review of the MIP images confirms the above findings CTA HEAD Anterior circulation: Intracranial internal carotid arteries are patent with calcified plaque but no significant  stenosis. Anterior and middle cerebral arteries are patent. Posterior circulation: Intracranial vertebral arteries are patent. Basilar artery is patent. Major cerebellar artery origins are patent. Posterior cerebral arteries are patent. Venous sinuses: Patent as allowed by contrast bolus timing. Review of the MIP images confirms the above findings CT Brain Perfusion Findings: CBF (<30%) Volume: 52m Perfusion (Tmax>6.0s) volume: 422mMismatch Volume: 53m79mnfarction Location: Area of calculated penumbra is in the left temporal lobe. IMPRESSION: No large vessel occlusion, hemodynamically significant stenosis, or evidence of dissection. Perfusion imaging demonstrates no evidence of core infarction. 4 mL of penumbra in the left temporal lobe is likely artifactual. Electronically Signed   By: PraMacy MisD.   On: 12/28/2021 16:29   CT HEAD CODE STROKE WO  CONTRAST  Result Date: 12/28/2021 CLINICAL DATA:  Code stroke.  Neuro deficit, acute, stroke suspected EXAM: CT HEAD WITHOUT CONTRAST TECHNIQUE: Contiguous axial images were obtained from the base of the skull through the vertex without intravenous contrast. RADIATION DOSE REDUCTION: This exam was performed according to the departmental dose-optimization program which includes automated exposure control, adjustment of the mA and/or kV according to patient size and/or use of iterative reconstruction technique. COMPARISON:  2012 FINDINGS: Brain: No acute intracranial hemorrhage, mass effect, or edema. Gray-white differentiation is preserved. Ventricles and sulci are within normal limits in size and configuration. No extra-axial collection. Vascular: No hyperdense vessel. There is intracranial atherosclerotic calcification at the skull base. Skull: Unremarkable. Sinuses/Orbits: No acute finding. Other: Mastoid air cells are clear. ASPECTS ([place] [facility] Early CT Score) - Ganglionic level infarction (caudate, lentiform nuclei, internal capsule, insula, M1-M3 cortex): 7 - Supraganglionic infarction (M4-M6 cortex): 3 Total score (0-10 with 10 being normal): 10 IMPRESSION: There is no acute intracranial hemorrhage or evidence of acute infarction. ASPECT score is 10. These results were called by telephone at the time of interpretation on 12/28/2021 at 4:09 pm to provider [facility] , who verbally acknowledged these results. Electronically Signed   By: PraMacy MisD.   On: 12/28/2021 16:10    Procedures Procedures  {Document cardiac monitor, telemetry assessment procedure when appropriate:1}  Medications Ordered in ED Medications  iohexol (OMNIPAQUE) 350 MG/ML injection 100 mL (100 mLs Intravenous Contrast Given 12/28/21 1615)    ED Course/ Medical Decision Making/ A&P  CRITICAL CARE Performed by: JosMilton Fergusontal critical care time: 40 minutes Critical care time was exclusive of separately  billable procedures and treating other patients. Critical care was necessary to treat or prevent imminent or life-threatening deterioration. Critical care was time spent personally by me on the following activities: development of treatment plan with patient and/or surrogate as well as nursing, discussions with consultants, evaluation of patient's response to treatment, examination of patient, obtaining history from patient or surrogate, ordering and performing treatments and interventions, ordering and review of laboratory studies, ordering and review of radiographic studies, pulse oximetry and re-evaluation of patient's condition.   Patient had a code stroke called.  She was seen by neurology and it was determined she did not need thrombolytics.  CT head she will be admitted to medicine for stroke work-up                         Medical Decision Making Amount and/or Complexity of Data Reviewed Labs: ordered. Radiology: ordered.  Risk Prescription drug management. Decision regarding hospitalization.   Patient with dizziness and possible stroke symptoms.  She is admitted to the hospital for stroke work-up  {Document critical  care time when appropriate:1} {Document review of labs and clinical decision tools ie heart score, Chads2Vasc2 etc:1}  {Document your independent review of radiology images, and any outside records:1} {Document your discussion with family members, caretakers, and with consultants:1} {Document social determinants of health affecting pt's care:1} {Document your decision making why or why not admission, treatments were needed:1} Final Clinical Impression(s) / ED Diagnoses Final diagnoses:  Dizziness    Rx / DC Orders ED Discharge Orders     None

## 2021-12-28 NOTE — Assessment & Plan Note (Signed)
-  60 units 70/30 at home -Continue 25 units long acting -Sliding scale correction -Carb modified diet -Continue to monitor CBGs

## 2021-12-28 NOTE — Assessment & Plan Note (Signed)
-  permissive HTN for 48 hours, to end at 1400 8/17 -Patient histories resume home medication

## 2021-12-28 NOTE — Progress Notes (Addendum)
Stroke Alert cart activation at 1554. Patient in CT on cart alert.

## 2021-12-28 NOTE — ED Notes (Signed)
Pt arrived back from CT

## 2021-12-28 NOTE — Assessment & Plan Note (Signed)
-  Continue statin and Zetia 

## 2021-12-28 NOTE — Consult Note (Addendum)
Addendum 01/13/22 per coding query: CTA H&N was performed to rule out large vessel occlusion in patient with NIHSS = 5 and additional neurologic findings concerning for posterior circulation infarct. CT perfusion was performed because she had been slurring her speech for 2 weeks and then developed new deficits on day of presentation, making LKW unclear. CT perfusion is indicated when CTA H&N is required for patient that is possibly > 6 hours from LKW.  Su Monks, MD [facility] [phone]  If 7pm- 7am, please page neurology on call as listed in [place].   NEUROLOGY TELECONSULTATION NOTE   Date of service: December 28, 2021 Patient Name: Joanna Brown MRN:  [id number] DOB:  [birth year] Reason for consult: telestroke  Requesting Provider: Dr. Milton Ferguson Consult Participants: myself, patient, bedside RN, telestroke RN Location of the provider: [facility] Location of the patient: [facility]  This consult was provided via telemedicine with 2-way video and audio communication. The patient/family was informed that care would be provided in this way and agreed to receive care in this manner.   _ _ _   _ __   _ __ _ _  __ __   _ __   __ _  History of Present Illness   81 yo woman with pmhx DM2, HTN, HL, severe AS s/p TAVR, chronic headaches who presented after acute onset of R sided neck pain, feeling off balance, dizziness. LKW initially thought to be when these sx began today at 1430, however then patient reported that she had actually been slurring her speech for the past 2 weeks. Therefore she was outside of the window for TNK. NIHSS = 5. BP 129/60, BG 244.  Stroke code imaging:  Head CT: NAICP, ASPECTS 10  CTA H&N: no LVO, no dissection  CT perfusion: negative  All CNS personally reviewed  Given no LVO there was no indication for intervention.   Patient was previously seen in ED in Jan 2023 with complaints of R sided numbness that were felt to be 2/2 sulfa smoke exposure. She  had a neg brain MRI at that time and was discharged without further workup.    ROS   Per HPI; all other systems reviewed and are negative  Past History   The following was personally reviewed:  Past Medical History:  Diagnosis Date   Anginal pain ([place])    Arthritis    OA   Diabetes mellitus    Dyspnea    Female bladder prolapse    GERD (gastroesophageal reflux disease)    Glaucoma    Hyperlipidemia    Hypertension    echo and stress 4/10 reports on chart, EKG ` LOV 9/12 on chart   S/P TAVR (transcatheter aortic valve replacement) 03/23/2021   Edwards 51m S3U TF approach with Dr. MAngelena Formand Dr. BCyndia Bent  Severe aortic stenosis    Thyroid disease    Past Surgical History:  Procedure Laterality Date   ABDOMINAL HYSTERECTOMY     ANTERIOR AND POSTERIOR REPAIR  04/26/2011   Procedure: ANTERIOR (CYSTOCELE) AND POSTERIOR REPAIR (RECTOCELE);  Surgeon: SReece Packer MD;  Location: [facility];  Service: Urology;  Laterality: N/A;   BIOPSY  05/25/2020   Procedure: BIOPSY;  Surgeon: DDoran Stabler MD;  [facility];  Service: Gastroenterology;;   CATARACT EXTRACTION Bilateral    with IOL   CHOLECYSTECTOMY  2009   COLONOSCOPY N/A 12/02/2013   three colon polyps removed, small internal hemorrhoids. Hyperplastic polyps   COLONOSCOPY N/A 11/20/2019  pancolonic diverticulosis, two 10-11 mm polyps in ascending colon, one 5 mm polyp in cecum, ascending colon with superficially invasive adenocarcinoma arising in background of sessile serrated polyps with low and high grade cytologic dysplasia.   COLONOSCOPY     COLONOSCOPY W/ POLYPECTOMY     COLONOSCOPY WITH PROPOFOL N/A 05/25/2020   diverticulosis in right colon, redundant colon. Caution warranted on future colonoscopy in light of age, cardiac condition, challenging anatomy.    DILATION AND CURETTAGE OF UTERUS     pt states this was in the 1970's   ESOPHAGOGASTRODUODENOSCOPY (EGD) WITH PROPOFOL N/A 05/25/2020   Grade  1 esophageal varices, single mucosal nodule in stomach s/p biopsy. (hyperplastic)>    LEFT HEART CATH  09/10/2008   normal coronary arteries, normal LV systolic function, EF 24% (Dr. [name])   [place] Right 1997   under arm   NM MYOCAR PERF WALL MOTION  2010   dipyridamole - mild-mod in intenstiy perfusion defect in mid anterior, mid anteroseptal wall, EF 70%   OVARY SURGERY     bilateral tumors removed   POLYPECTOMY  11/20/2019   Procedure: POLYPECTOMY;  Surgeon: Daneil Dolin, MD;  Location: [facility];  Service: Endoscopy;;  hot and cold snare cecal polyp, and asending polyps x 2   RIGHT/LEFT HEART CATH AND CORONARY ANGIOGRAPHY N/A 10/02/2020   Procedure: RIGHT/LEFT HEART CATH AND CORONARY ANGIOGRAPHY;  Surgeon: Martinique, Peter M, MD;  Location: [place] [facility];  Service: Cardiovascular;  Laterality: N/A;   THYROIDECTOMY     THYROIDECTOMY  02/2008   TRANSCATHETER AORTIC VALVE REPLACEMENT, TRANSFEMORAL N/A 03/23/2021   Procedure: TRANSCATHETER AORTIC VALVE REPLACEMENT, TRANSFEMORAL;  Surgeon: Burnell Blanks, MD;  Location: [place] [facility];  Service: Open Heart Surgery;  Laterality: N/A;   TRANSTHORACIC ECHOCARDIOGRAM  08/2011   EF=>55%, mild conc LVH; trace MR; mild TR; mild-mod AV calcification with mild valvular AV stenosis   VAGINAL PROLAPSE REPAIR  04/26/2011   Procedure: VAGINAL VAULT SUSPENSION;  Surgeon: Reece Packer, MD;  Location: [facility];  Service: Urology;  Laterality: N/A;  with Graft  10x6   Family History  Problem Relation Age of Onset   Stomach cancer Mother 23   Heart disease Mother 77       heart disease   Heart disease Father 59       MI   Stroke Maternal Grandfather    Heart attack Paternal Grandfather    Hypertension Brother    Bone cancer Brother    Hypertension Sister    Liver cancer Sister    Hypertension Sister    Hypertension Child    Colon cancer Neg Hx    Colon polyps Neg Hx    Pancreatic cancer Neg Hx     Esophageal cancer Neg Hx    Rectal cancer Neg Hx    Social History   Socioeconomic History   Marital status: Married    Spouse name: [name]   Number of children: 1   Years of education: Trade   Highest education level: 12th grade  Occupational History   Occupation: Retired   Occupation: Writer  Tobacco Use   Smoking status: Never   Smokeless tobacco: Never  Scientific laboratory technician Use: Never used  Substance and Sexual Activity   Alcohol use: Not Currently   Drug use: No   Sexual activity: Yes  Other Topics Concern   Not on file  Social History Narrative   Patient lives at home  with spouse. 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 LIVED IN [place] FOR 50 YRS AND CAME BACK TO [place] ~2009. Caffeine Use: 1 cup of coffee daily. HAD ONE CHILD: PASSED 5 YRS. HAVE THREE GRAND-KIDS AND TWO GREAT GRANDS.    Social Determinants of Health   Financial Resource Strain: Low Risk  (08/02/2021)   Overall Financial Resource Strain (CARDIA)    Difficulty of Paying Living Expenses: Not very hard  Food Insecurity: No Food Insecurity (08/02/2021)   Hunger Vital Sign    Worried About Running Out of Food in the Last Year: Never true    Ran Out of Food in the Last Year: Never true  Transportation Needs: No Transportation Needs (08/02/2021)   PRAPARE - Hydrologist (Medical): No    Lack of Transportation (Non-Medical): No  Physical Activity: Insufficiently Active (08/02/2021)   Exercise Vital Sign    Days of Exercise per Week: 7 days    Minutes of Exercise per Session: 10 min  Stress: No Stress Concern Present (08/02/2021)   [place]    Feeling of Stress : Not at all  Social Connections: [place] (08/02/2021)   Social Connection and Isolation Panel [NHANES]    Frequency of Communication with Friends and Family: More than three times a week    Frequency of Social  Gatherings with Friends and Family: Once a week    Attends Religious Services: More than 4 times per year    Active Member of [employer] or Organizations: Yes    Attends Archivist Meetings: 1 to 4 times per year    Marital Status: Married   Allergies  Allergen Reactions   Senokot Wheat Bran [Wheat Bran]     ABDOMINAL CRAMPS   Spironolactone     Stomach problems, vision changes     Medications   (Not in a hospital admission)    No current facility-administered medications for this encounter.  Current Outpatient Medications:    acetaminophen (TYLENOL) 500 MG tablet, Take 500-1,000 mg by mouth every 6 (six) hours as needed (pain)., Disp: , Rfl:    ALPHAGAN P 0.1 % SOLN, Place 1 drop into both eyes 2 (two) times daily., Disp: , Rfl:    amLODipine (NORVASC) 10 MG tablet, TAKE 1 TABLET(10 MG) BY MOUTH EVERY MORNING, Disp: 90 tablet, Rfl: 3   Ascorbic Acid (VITAMIN C) 1000 MG tablet, Take 1,000 mg by mouth 4 (four) times a week. AT NIGHT, Disp: , Rfl:    aspirin EC 81 MG tablet, Take 81 mg by mouth every other day. In the morning. Swallow whole., Disp: , Rfl:    benazepril (LOTENSIN) 40 MG tablet, TAKE 1 TABLET(40 MG) BY MOUTH DAILY, Disp: 90 tablet, Rfl: 1   Blood Glucose Monitoring Suppl (ACCU-CHEK GUIDE) w/Device KIT, 1 each by Does not apply route 4 (four) times daily., Disp: 1 kit, Rfl: 0   cholecalciferol (VITAMIN D3) 25 MCG (1000 UT) tablet, Take 1,000 Units by mouth in the morning., Disp: , Rfl:    cloNIDine (CATAPRES) 0.1 MG tablet, Take 1 tablet (0.1 mg total) by mouth daily., Disp: 30 tablet, Rfl: 5   clotrimazole-betamethasone (LOTRISONE) cream, APPLY TOPICALLY TO THE AFFECTED AREA TWICE DAILY FOR 1 WEEK THEN AS NEEDED, Disp: 45 g, Rfl: 0   ezetimibe (ZETIA) 10 MG tablet, Take 1 tablet (10 mg total) by mouth daily., Disp: 90 tablet, Rfl: 3   glipiZIDE (GLUCOTROL XL)  5 MG 24 hr tablet, TAKE 1 TABLET(5 MG) BY MOUTH DAILY WITH BREAKFAST, Disp: 90 tablet, Rfl: 0   glucose  blood (ACCU-CHEK GUIDE) test strip, USE TO CHECK BLOOD TWICE DAILY, Disp: 150 strip, Rfl: 1   insulin isophane & regular human KwikPen (HUMULIN 70/30 KWIKPEN) (70-30) 100 UNIT/ML KwikPen, 60 units in the am and 40 units at night, Disp: 30 mL, Rfl: 2   Insulin Pen Needle (B-D ULTRAFINE III SHORT PEN) 31G X 8 MM MISC, USE WITH INSULIN TWICE DAILY AS DIRECTED, Disp: 200 each, Rfl: 2   latanoprost (XALATAN) 0.005 % ophthalmic solution, Place 1 drop into both eyes at bedtime. , Disp: , Rfl:    linaclotide (LINZESS) 290 MCG CAPS capsule, Take 1 capsule (290 mcg total) by mouth daily before breakfast., Disp: 90 capsule, Rfl: 3   LINZESS 290 MCG CAPS capsule, TAKE 1 CAPSULE(290 MCG) BY MOUTH DAILY BEFORE AND BREAKFAST, Disp: 90 capsule, Rfl: 3   metFORMIN (GLUCOPHAGE) 500 MG tablet, Take 1 tablet (500 mg total) by mouth daily with breakfast., Disp: 90 tablet, Rfl: 1   Multiple Vitamin (MULTIVITAMIN WITH MINERALS) TABS tablet, Take 1 tablet by mouth daily., Disp: , Rfl:    Omega-3 Fatty Acids (FISH OIL) 1200 MG CAPS, Take 1,200 mg by mouth every morning., Disp: , Rfl:    pantoprazole (PROTONIX) 40 MG tablet, TAKE 1 TABLET(40 MG) BY MOUTH DAILY 30 MINUTES BEFORE A MEAL, Disp: 90 tablet, Rfl: 1   polyethylene glycol (MIRALAX) 17 g packet, Take 17 g by mouth at bedtime., Disp: 14 each, Rfl: 0   pravastatin (PRAVACHOL) 80 MG tablet, TAKE 1 TABLET(80 MG) BY MOUTH DAILY, Disp: 90 tablet, Rfl: 3   triamterene-hydrochlorothiazide (MAXZIDE) 75-50 MG tablet, TAKE 1 TABLET BY MOUTH DAILY, Disp: 90 tablet, Rfl: 2  Vitals   Vitals:   12/28/21 1551 12/28/21 1554  BP: 129/60   Pulse: 73   Resp: 16   Temp: 98.5 F (36.9 C)   TempSrc: Oral   SpO2: 99%   Weight:  85.1 kg  Height:  5' 5" (1.651 m)     Body mass index is 31.23 kg/m.  Physical Exam   Exam performed over telemedicine with 2-way video and audio communication and with assistance of bedside RN  Physical Exam Gen: A&O x4, NAD Resp: normal  WOB CV: extremities appear well-perfused  Neuro: *MS: A&O x4. Follows multi-step commands.  *Speech: minimal dysarthria, minimal aphasia (able to name 5 of 6 objects), repetition intact *CN: PERRL 60m, EOMI, VFF by confrontation, sensation intact, smile symmetric, hearing intact to voice *Motor:   Normal bulk.  No tremor, rigidity or bradykinesia. BUE appear symmetric and full-strength with no drift. Slight drift BLE, again symmetric *Sensory: SILT except slight impairment to LT LLE. No double-simultaneous extinction.  *Coordination:  Finger-to-nose, heel-to-shin, rapid alternating motions were intact. *Reflexes:  UTA 2/2 tele-exam *Gait: deferred  NIHSS  1a Level of Conscious.: 0 1b LOC Questions: 0 1c LOC Commands: 0 2 Best Gaze: 0 3 Visual: 0 4 Facial Palsy: 0 5a Motor Arm - left: 0 5b Motor Arm - Right: 0 6a Motor Leg - Left: 1 6b Motor Leg - Right: 1 7 Limb Ataxia: 0 8 Sensory: 1 9 Best Language: 1 10 Dysarthria: 1 11 Extinct. and Inatten.: 0  TOTAL: 5   Premorbid mRS = 2     Labs   CBC: No results for input(s): "WBC", "NEUTROABS", "HGB", "HCT", "MCV", "PLT" in the last 168 hours.  Basic Metabolic Panel:  Lab Results  Component Value Date   NA 139 12/14/2021   K 3.9 12/14/2021   CO2 26 12/14/2021   GLUCOSE 104 (H) 12/14/2021   BUN 13 12/14/2021   CREATININE 1.04 (H) 12/14/2021   CALCIUM 9.8 12/14/2021   GFRNONAA 51 (L) 08/21/2021   GFRAA 66 07/30/2019   Lipid Panel:  Lab Results  Component Value Date   LDLCALC 90 12/14/2021   HgbA1c:  Lab Results  Component Value Date   HGBA1C 7.5 (A) 12/02/2021   Urine Drug Screen: No results found for: "LABOPIA", "COCAINSCRNUR", "LABBENZ", "AMPHETMU", "THCU", "LABBARB"  Alcohol Level No results found for: "ETH"   Impression   82 yo woman with pmhx DM2, HTN, HL, severe AS s/p TAVR, chronic headaches who presented after acute onset of R sided neck pain, feeling off balance, dizziness. LKW initially thought  to be when these sx began today at 1430, however then patient reported that she had actually been slurring her speech for the past 2 weeks. Therefore she was outside of the window for TNK. NIHSS = 5. Head CT, CTA H&N, and CT perfusion were all unrevealing. I am still concerned that she may have had a small acute/subacute infarct and recommend admission for stroke workup. While peripheral vertigo is on the differential, that would not explain her recent persistent dysarthria.  Recommendations   - [facility] to [facility] for stroke workup - NPO until RN dysphagia screen passed and documented.  - Permissive HTN x48 hrs from sx onset or until stroke ruled out by MRI goal BP <220/110. PRN labetalol or hydralazine if BP above these parameters. Avoid oral antihypertensives. - MRI brain wo contrast - TTE w/ bubble - Check A1c and LDL + add statin per guidelines - ASA 28m daily + plavix 76mdaily x21 days f/b plavix 7548maily monotherapy after that - q4 hr neuro checks - STAT head CT for any change in neuro exam - Tele - PT/OT/SLP - Stroke education - Amb referral to neurology upon discharge   If primary team has any questions or requires neuro f/u after today, please place routine teleconsult to Dr. YadHortense Ramalnder [facility]_____________________________________   Thank you for the opportunity to take part in the care of this patient. If you have any further questions, please contact the neurology consultation attending.  Signed,  ColSu MonksD [facility] [phone] 7pm- 7am, please page neurology on call as listed in [facility] [place]

## 2021-12-28 NOTE — Assessment & Plan Note (Signed)
-  CT head shows no acute intracranial abnormalities; There is mention of 25m penumbra in Left temporal, but likely to be artifact -CTA shows no large vessel occlusion -UKoreacarotids done 12/21/21 and shows minimal heterogenous plaque without hemodynamically significant stenosis -Initial NIHSS 5 -NIHSS at admission 2 -Tele-neuro rec's admission for stroke workup  Last Hgb A1C 26 days ago 7.5  MRI in AM  Echo in AM  q 4H neuro checks  Lipid panel in AM  Asa and plavix  PT/OT/SLP in AM  All above are ordered -continue to monitor on tele

## 2021-12-28 NOTE — ED Notes (Signed)
Admitting at bedside 

## 2021-12-29 ENCOUNTER — Observation Stay (HOSPITAL_COMMUNITY): Payer: Medicare Other

## 2021-12-29 ENCOUNTER — Observation Stay (HOSPITAL_BASED_OUTPATIENT_CLINIC_OR_DEPARTMENT_OTHER): Payer: Medicare Other

## 2021-12-29 DIAGNOSIS — E1122 Type 2 diabetes mellitus with diabetic chronic kidney disease: Secondary | ICD-10-CM | POA: Diagnosis not present

## 2021-12-29 DIAGNOSIS — I6389 Other cerebral infarction: Secondary | ICD-10-CM

## 2021-12-29 DIAGNOSIS — I10 Essential (primary) hypertension: Secondary | ICD-10-CM | POA: Diagnosis not present

## 2021-12-29 DIAGNOSIS — I639 Cerebral infarction, unspecified: Secondary | ICD-10-CM | POA: Diagnosis not present

## 2021-12-29 DIAGNOSIS — N181 Chronic kidney disease, stage 1: Secondary | ICD-10-CM | POA: Diagnosis not present

## 2021-12-29 DIAGNOSIS — E782 Mixed hyperlipidemia: Secondary | ICD-10-CM | POA: Diagnosis not present

## 2021-12-29 DIAGNOSIS — Z794 Long term (current) use of insulin: Secondary | ICD-10-CM | POA: Diagnosis not present

## 2021-12-29 DIAGNOSIS — K219 Gastro-esophageal reflux disease without esophagitis: Secondary | ICD-10-CM | POA: Diagnosis not present

## 2021-12-29 LAB — LIPID PANEL
Cholesterol: 142 mg/dL (ref 0–200)
HDL: 34 mg/dL — ABNORMAL LOW (ref >40)
LDL Cholesterol: 59 mg/dL (ref 0–99)
Total CHOL/HDL Ratio: 4.2 RATIO
Triglycerides: 243 mg/dL — ABNORMAL HIGH (ref <150)
VLDL: 49 mg/dL — ABNORMAL HIGH (ref 0–40)

## 2021-12-29 LAB — ECHOCARDIOGRAM COMPLETE
AR max vel: 2 cm2
AV Area VTI: 1.99 cm2
AV Area mean vel: 1.97 cm2
AV Mean grad: 15 mmHg
AV Peak grad: 28.7 mmHg
Ao pk vel: 2.68 m/s
Area-P 1/2: 1.86 cm2
Calc EF: 56.9 %
Height: 63 in
MV VTI: 2.85 cm2
S' Lateral: 2.9 cm
Single Plane A2C EF: 62.7 %
Single Plane A4C EF: 51.6 %
Weight: 2998.26 oz

## 2021-12-29 LAB — GLUCOSE, CAPILLARY
Glucose-Capillary: 138 mg/dL — ABNORMAL HIGH (ref 70–99)
Glucose-Capillary: 167 mg/dL — ABNORMAL HIGH (ref 70–99)

## 2021-12-29 MED ORDER — POTASSIUM CHLORIDE 20 MEQ PO PACK
40.0000 meq | PACK | Freq: Once | ORAL | Status: AC
  Administered 2021-12-29: 40 meq via ORAL
  Filled 2021-12-29: qty 2

## 2021-12-29 MED ORDER — CLOPIDOGREL BISULFATE 75 MG PO TABS
75.0000 mg | ORAL_TABLET | Freq: Every day | ORAL | 2 refills | Status: AC
Start: 2021-12-30 — End: 2022-03-30

## 2021-12-29 MED ORDER — ASPIRIN 81 MG PO TBEC: 81.0000 mg | DELAYED_RELEASE_TABLET | ORAL | 0 refills | Status: AC

## 2021-12-29 NOTE — Evaluation (Signed)
Occupational Therapy Evaluation Patient Details Name: Joanna Brown MRN: [id number] DOB: [birth year] Today's Date: 12/29/2021   History of Present Illness Joanna Brown is a 81 y.o. female with medical history significant of diabetes mellitus type 2, GERD, glaucoma, hyperlipidemia, hypertension, thyroid disease, transcatheter aortic valve replacement secondary to aortic stenosis, and more presents to the ED with a chief complaint of neck pain and ataxia.  Patient reports that this has been going on for a couple weeks.  It comes in episodes will last for minutes and then goes away.  Became acutely worse on day of presentation at 2 PM.  Patient had just finished fixing a meal when she had right neck pain.  She describes it as a pressure/burning.  Pain is resolved now.  She is not sure what made it go away.  She has no history of torticollis or muscle spasms in her neck.  She did not have any difficulty swallowing.  She did report difficulty speaking.  She felt like she could not get the words out.  She is not sure if her speech was slurred.  Patient reports that she cannot walk because she felt off balance like she might pass out.  She reports that she has had some dizziness episodes, and was recently started a work-up outpatient with ultrasound carotids done on August 8.  This did not feel like dizziness, but more like she might pass out.  Patient reports she had a headache with that it was located over her left temple and down her face.  She still feels a stiffness in her head but the pain is gone per her report.  She was nauseous but had no vomiting.  Patient reports that her last bowel movement was on the day of presentation, and her last meal was at the time of symptom onset.  She is also had dysuria per her report and UA is pending.  Patient has no other complaints at this time.  She feels that she is mostly back to normal at this point. (Per DO)   Clinical Impression   Pt agreeable to OT and PT  co-evaluation. Pt presents at or near baseline with only notably deficit being decreased sensation of light touch on R UE below elbow and in R LE. Pt does report history of neuropathy. Pt was able to transfer and don shoes without difficulty. Pt is not recommended for further acute OT services and will be discharged to care of nursing staff for remaining length of stay.      Recommendations for follow up therapy are one component of a multi-disciplinary discharge planning process, led by the attending physician.  Recommendations may be updated based on patient status, additional functional criteria and insurance authorization.   Follow Up Recommendations  No OT follow up    Assistance Recommended at Discharge None  Patient can return home with the following      Functional Status Assessment  Patient has not had a recent decline in their functional status  Equipment Recommendations  None recommended by OT    Recommendations for Other Services       Precautions / Restrictions Precautions Precautions: None Restrictions Weight Bearing Restrictions: No      Mobility Bed Mobility Overal bed mobility: Independent                  Transfers Overall transfer level: Independent Equipment used: None               General transfer comment:  Able to ambulate in hall and return to bed without assist.      Balance Overall balance assessment: Independent                                         ADL either performed or assessed with clinical judgement   ADL Overall ADL's : Independent                                       General ADL Comments: Able to doff shoes and demonstrate good mobilty without AD.     Vision Baseline Vision/History: 1 Wears glasses Ability to See in Adequate Light: 1 Impaired Patient Visual Report: No change from baseline Vision Assessment?: No apparent visual deficits                Pertinent Vitals/Pain  Pain Assessment Pain Assessment: No/denies pain     Hand Dominance Right   Extremity/Trunk Assessment Upper Extremity Assessment Upper Extremity Assessment: RUE deficits/detail RUE Deficits / Details: WFL strength. Decreased sensation elbow to hand. Pt reports  history of neuropothy. RUE Sensation: decreased light touch RUE Coordination: WNL   Lower Extremity Assessment Lower Extremity Assessment: Defer to PT evaluation   Cervical / Trunk Assessment Cervical / Trunk Assessment: Normal   Communication Communication Communication: No difficulties   Cognition Arousal/Alertness: Awake/alert Behavior During Therapy: WFL for tasks assessed/performed Overall Cognitive Status: Within Functional Limits for tasks assessed                                                        Home Living Family/patient expects to be discharged to:: Private residence Living Arrangements: Spouse/significant other Available Help at Discharge: Family;Available 24 hours/day Type of Home: Apartment Home Access: Level entry     Home Layout: One level     Bathroom Shower/Tub: Teacher, early years/pre: Handicapped height Bathroom Accessibility: Yes How Accessible: Accessible via walker Home Equipment: None          Prior Functioning/Environment Prior Level of Function : Independent/Modified Independent             Mobility Comments: Hydrographic surveyor without AD ADLs Comments: Independent ADL and IADL's                      OT Goals(Current goals can be found in the care plan section) Acute Rehab OT Goals Patient Stated Goal: return home  OT Frequency:      Co-evaluation PT/OT/SLP Co-Evaluation/Treatment: Yes Reason for Co-Treatment: To address functional/ADL transfers   OT goals addressed during session: ADL's and self-care      AM-PAC OT "6 Clicks" Daily Activity     Outcome Measure Help from another person eating meals?: None Help from  another person taking care of personal grooming?: None Help from another person toileting, which includes using toliet, bedpan, or urinal?: None Help from another person bathing (including washing, rinsing, drying)?: None Help from another person to put on and taking off regular upper body clothing?: None Help from another person to put on and taking off regular lower body clothing?: None 6 Click Score: 24  End of Session    Activity Tolerance: Patient tolerated treatment well Patient left: in bed;with call bell/phone within reach  OT Visit Diagnosis: Dizziness and giddiness (R42);Unsteadiness on feet (R26.81)                Time: 7955-8316 OT Time Calculation (min): 11 min Charges:  OT General Charges $OT Visit: 1 Visit OT Evaluation $OT Eval Low Complexity: 1 Low  Greysen Swanton OT, MOT  Larey Seat 12/29/2021, 10:37 AM

## 2021-12-29 NOTE — Progress Notes (Signed)
Stroke initiated per orders during admission, scale=0 every 2 hours, patient rested quietly throughout night with no c/o pain or distress noted. Pt toileted and offered fluids, UA collected and pending. Patient watching TV with call bell in reach.

## 2021-12-29 NOTE — Discharge Summary (Signed)
Physician Discharge Summary   Patient: Joanna Brown MRN: [id number] DOB: [birth year]  Admit date:     12/28/2021  Discharge date: 12/29/21  Discharge Physician: Deatra James   PCP: Fayrene Helper, MD   Recommendations at discharge:   Follow-up with a neurologist in 1-2 weeks: Currently recommending- ASA 46m daily + plavix 774mdaily x21 days then  plavix 7542maily monotherapy after that Follow-up with her PCP in 1-4 weeks  Discharge Diagnoses: Principal Problem:   Stroke (cerebrum) (HCCClioctive Problems:   Type 2 diabetes mellitus with stage 1 chronic kidney disease, with long-term current use of insulin (HCC)   HLD (hyperlipidemia)   Essential hypertension   GERD (gastroesophageal reflux disease)  Resolved Problems:   * No resolved hospital problems. *  Hospital Course: [name] a 81 16o. female with medical history significant of diabetes mellitus type 2, GERD, glaucoma, hyperlipidemia, hypertension, thyroid disease, transcatheter aortic valve replacement secondary to aortic stenosis, and more presents to the ED with a chief complaint of neck pain and ataxia. Patient reports that this has been going on for a couple weeks.  It comes in episodes will last for minutes and then goes away.  Became acutely worse on day of presentation at 2 PM.  Patient had just finished fixing a meal when she had right neck pain.  She describes it as a pressure/burning.  Pain is resolved now.  She is not sure what made it go away.  She has no history of torticollis or muscle spasms in her neck.  She did not have any difficulty swallowing.  She did report difficulty speaking.  She felt like she could not get the words out.  She is not sure if her speech was slurred.  Patient reports that she cannot walk because she felt off balance like she might pass out.  She reports that she has had some dizziness episodes, and was recently started a work-up outpatient with ultrasound carotids done  on August 8.  This did not feel like dizziness, but more like she might pass out.  Patient reports she had a headache with that it was located over her left temple and down her face.  She still feels a stiffness in her head but the pain is gone per her report.  She was nauseous but had no vomiting.  Patient reports that her last bowel movement was on the day of presentation, and her last meal was at the time of symptom onset.  She is also had dysuria per her report and UA is pending.  Patient has no other complaints at this time.  She feels that she is mostly back to normal at this point.   Patient does not smoke, does not drink, does not use illicit drugs.  Patient is vaccinated for COVID.  Patient is full code.  ED Temp 98.5, heart rate 64-74, respiratory rate 13-23, blood pressure 110/52-136/73, satting at 97% No leukocytosis with a white blood cell count of 6.6, hemoglobin 11.3 Slight hypokalemia at 3.4 Creatinine is close to baseline at 1.27 with baseline being 1.1 CT head shows no evidence of cord infarction.  For millimeter penumbra in left temporal lobe is likely artifactual CTA head shows no large vessel occlusion Initial NIHSS was 5, at admission NIHSS is 2 Neuro saw patient recommends 48 hours of permissive hypertension, 3 weeks of aspirin and Plavix, TTE, A1c, LDL On August 8 patient had ultrasound carotid done that showed minimal plaque with no hemodynamically  significant stenosis by duplex criteria Admission requested for stroke work-up  Assessment and Plan: * Stroke (cerebrum) ([place]) -Acute stroke was ruled out -possible TIA -Neurology recommended: - ASA 33m daily + plavix 736mdaily x21 days f/b plavix 7523maily monotherapy after that Continue full dose statin  -CT head shows no acute intracranial abnormalities; There is mention of 4ml11mnumbra in Left temporal, but likely to be artifact -CTA shows no large vessel occlusion -US cKoreaotids done 12/21/21 and shows minimal  heterogenous plaque without hemodynamically significant stenosis -MRI/MRA negative for any acute findings  -Initial NIHSS 5 -Symptoms has resolved, no new focal neurological findings -Tele-neuro rec's admission for stroke workup Labs within normal limits, LDL 34, TG 243,  Echo: Reviewed  Status post evaluation by PT/OT/SLP -no further needs     GERD (gastroesophageal reflux disease) -Continue protonix  Essential hypertension -permissive HTN for 48 hours, to end at 1400 8/17 -Patient histories resume home medication    HLD (hyperlipidemia) -Continue statin and Zetia  Type 2 diabetes mellitus with stage 1 chronic kidney disease, with long-term current use of insulin (HCC) -60 units 70/30 at home -Continue 25 units long acting -Sliding scale correction -Carb modified diet -Continue home regimen Last A1c on 12/08/2021 was 7.5        Consultants: Teleneurology Procedures performed: CT/MRA MRI and echocardiogram Disposition: Home Diet recommendation:  Discharge Diet Orders (From admission, onward)     Start     Ordered   12/29/21 0000  Diet Carb Modified        12/29/21 1224           Carb modified diet DISCHARGE MEDICATION: Allergies as of 12/29/2021       Reactions   Senokot Wheat Bran [wheat Bran]    ABDOMINAL CRAMPS   Spironolactone    Stomach problems, vision changes         Medication List     STOP taking these medications    cloNIDine 0.1 MG tablet Commonly known as: CATAPRES   triamterene-hydrochlorothiazide 75-50 MG tablet Commonly known as: MAXZIDE       TAKE these medications    Accu-Chek Guide test strip Generic drug: glucose blood USE TO CHECK BLOOD TWICE DAILY   Accu-Chek Guide w/Device Kit 1 each by Does not apply route 4 (four) times daily.   acetaminophen 500 MG tablet Commonly known as: TYLENOL Take 500-1,000 mg by mouth every 6 (six) hours as needed (pain).   Alphagan P 0.1 % Soln Generic drug:  brimonidine Place 1 drop into both eyes 2 (two) times daily.   amLODipine 10 MG tablet Commonly known as: NORVASC TAKE 1 TABLET(10 MG) BY MOUTH EVERY MORNING What changed: See the new instructions.   aspirin EC 81 MG tablet Take 1 tablet (81 mg total) by mouth every other day for 21 days. Swallow whole. Start taking on: December 31, 2021 What changed: additional instructions   B-D ULTRAFINE III SHORT PEN 31G X 8 MM Misc Generic drug: Insulin Pen Needle USE WITH INSULIN TWICE DAILY AS DIRECTED   benazepril 40 MG tablet Commonly known as: LOTENSIN TAKE 1 TABLET(40 MG) BY MOUTH DAILY What changed: See the new instructions.   cholecalciferol 25 MCG (1000 UNIT) tablet Commonly known as: VITAMIN D3 Take 1,000 Units by mouth in the morning.   clopidogrel 75 MG tablet Commonly known as: PLAVIX Take 1 tablet (75 mg total) by mouth daily. Start taking on: December 30, 2021   ezetimibe 10 MG tablet Commonly known as: ZETIA  Take 1 tablet (10 mg total) by mouth daily.   Fish Oil 1200 MG Caps Take 1,200 mg by mouth every morning.   glipiZIDE 5 MG 24 hr tablet Commonly known as: GLUCOTROL XL TAKE 1 TABLET(5 MG) BY MOUTH DAILY WITH BREAKFAST   HumuLIN 70/30 KwikPen (70-30) 100 UNIT/ML KwikPen Generic drug: insulin isophane & regular human KwikPen 60 units in the am and 40 units at night   latanoprost 0.005 % ophthalmic solution Commonly known as: XALATAN Place 1 drop into both eyes at bedtime.   linaclotide 290 MCG Caps capsule Commonly known as: Linzess Take 1 capsule (290 mcg total) by mouth daily before breakfast.   metFORMIN 500 MG tablet Commonly known as: GLUCOPHAGE Take 1 tablet (500 mg total) by mouth daily with breakfast.   multivitamin with minerals Tabs tablet Take 1 tablet by mouth daily.   pantoprazole 40 MG tablet Commonly known as: PROTONIX TAKE 1 TABLET(40 MG) BY MOUTH DAILY 30 MINUTES BEFORE A MEAL What changed: See the new instructions.   polyethylene  glycol 17 g packet Commonly known as: MiraLax Take 17 g by mouth at bedtime.   pravastatin 80 MG tablet Commonly known as: PRAVACHOL TAKE 1 TABLET(80 MG) BY MOUTH DAILY What changed: See the new instructions.   vitamin C 1000 MG tablet Take 1,000 mg by mouth 4 (four) times a week. AT NIGHT        Discharge Exam: Danley Danker Weights   12/28/21 1554 12/28/21 2100  Weight: 85.1 kg 85 kg      Physical Exam:   General:  AAO x 3,  cooperative, no distress;   HEENT:  Normocephalic, PERRL, otherwise with in Normal limits   Neuro:  CNII-XII intact. , normal motor and sensation, reflexes intact   Lungs:   Clear to auscultation BL, Respirations unlabored,  No wheezes / crackles  Cardio:    S1/S2, RRR, No murmure, No Rubs or Gallops   Abdomen:  Soft, non-tender, bowel sounds active all four quadrants, no guarding or peritoneal signs.  Muscular  skeletal:  Limited exam -global generalized weaknesses - in bed, able to move all 4 extremities,   2+ pulses,  symmetric, No pitting edema  Skin:  Dry, warm to touch, negative for any Rashes,  Wounds: Please see nursing documentation          Condition at discharge: good  The results of significant diagnostics from this hospitalization (including imaging, microbiology, ancillary and laboratory) are listed below for reference.   Imaging Studies: ECHOCARDIOGRAM COMPLETE  Result Date: 12/29/2021    ECHOCARDIOGRAM REPORT   Patient Name:   FELICIE KOCHER Stmartin Date of Exam: 12/29/2021 Medical Rec #:  [id number]        Height:       63.0 in Accession #:    [id number]       Weight:       187.4 lb Date of Birth:  Sep 15, 1940         BSA:          1.881 m Patient Age:    30 years         BP:           148/72 mmHg Patient Gender: F                HR:           59 bpm. Exam Location:  [facility] Procedure: 2D Echo, Cardiac Doppler and Color Doppler Indications:    Stroke  History:  Patient has prior history of Echocardiogram examinations, most                  recent 04/26/2021. Stroke; Risk Factors:Hypertension, Diabetes                 and Dyslipidemia. There is a 35m Edwards Sapien valve in the                 Aortic position. TAVR 03/2021.                 Aortic Valve: 23 mm Sapien prosthetic, stented (TAVR) valve is                 present in the aortic position.  Sonographer:    DWenda LowReferring Phys: [id number] B [place] 1. Left ventricular ejection fraction, by estimation, is 60 to 65%. The left ventricle has normal function. The left ventricle has no regional wall motion abnormalities. There is mild left ventricular hypertrophy. Left ventricular diastolic parameters are indeterminate.  2. Right ventricular systolic function is normal. The right ventricular size is normal. There is normal pulmonary artery systolic pressure. The estimated right ventricular systolic pressure is 268.0mmHg.  3. Left atrial size was mild to moderately dilated.  4. The mitral valve is abnormal. Mild mitral valve regurgitation. Mild mitral stenosis. The mean mitral valve gradient is 3.0 mmHg. Severe mitral annular calcification.  5. The aortic valve has been repaired/replaced. Aortic valve regurgitation is not visualized. There is a 23 mm Sapien prosthetic (TAVR) valve present in the aortic position. Aortic valve mean gradient measures 15.0 mmHg.  6. The inferior vena cava is normal in size with greater than 50% respiratory variability, suggesting right atrial pressure of 3 mmHg. Comparison(s): Prior images reviewed side by side. TAVR functioning normal, mean gradient 15 mmHg up from 12 mmHg. No paravalvular regurgitation. Severe mitral annular calcification with mild mitral regurgitation and stenosis. FINDINGS  Left Ventricle: Left ventricular ejection fraction, by estimation, is 60 to 65%. The left ventricle has normal function. The left ventricle has no regional wall motion abnormalities. The left ventricular internal cavity size was normal in size.  There is  mild left ventricular hypertrophy. Left ventricular diastolic function could not be evaluated due to mitral annular calcification (moderate or greater). Left ventricular diastolic parameters are indeterminate. Right Ventricle: The right ventricular size is normal. No increase in right ventricular wall thickness. Right ventricular systolic function is normal. There is normal pulmonary artery systolic pressure. The tricuspid regurgitant velocity is 2.43 m/s, and  with an assumed right atrial pressure of 3 mmHg, the estimated right ventricular systolic pressure is 232.1mmHg. Left Atrium: Left atrial size was mild to moderately dilated. Right Atrium: Right atrial size was normal in size. Pericardium: There is no evidence of pericardial effusion. Mitral Valve: The mitral valve is abnormal. Severe mitral annular calcification. Mild mitral valve regurgitation. Mild mitral valve stenosis. MV peak gradient, 9.9 mmHg. The mean mitral valve gradient is 3.0 mmHg. Tricuspid Valve: The tricuspid valve is grossly normal. Tricuspid valve regurgitation is trivial. Aortic Valve: The aortic valve has been repaired/replaced. Aortic valve regurgitation is not visualized. Aortic valve mean gradient measures 15.0 mmHg. Aortic valve peak gradient measures 28.7 mmHg. Aortic valve area, by VTI measures 1.99 cm. There is a  23 mm Sapien prosthetic, stented (TAVR) valve present in the aortic position. Pulmonic Valve: The pulmonic valve was grossly normal. Pulmonic valve regurgitation is trivial. Aorta: The aortic root is normal  in size and structure. Venous: The inferior vena cava is normal in size with greater than 50% respiratory variability, suggesting right atrial pressure of 3 mmHg. IAS/Shunts: No atrial level shunt detected by color flow Doppler.  LEFT VENTRICLE PLAX 2D LVIDd:         4.60 cm     Diastology LVIDs:         2.90 cm     LV e' medial:    5.77 cm/s LV PW:         1.20 cm     LV E/e' medial:  16.6 LV IVS:         1.10 cm     LV e' lateral:   7.51 cm/s LVOT diam:     1.90 cm     LV E/e' lateral: 12.7 LV SV:         143 LV SV Index:   76 LVOT Area:     2.84 cm  LV Volumes (MOD) LV vol d, MOD A2C: 62.7 ml LV vol d, MOD A4C: 43.6 ml LV vol s, MOD A2C: 23.4 ml LV vol s, MOD A4C: 21.1 ml LV SV MOD A2C:     39.3 ml LV SV MOD A4C:     43.6 ml LV SV MOD BP:      30.5 ml RIGHT VENTRICLE RV Basal diam:  4.10 cm LEFT ATRIUM             Index        RIGHT ATRIUM           Index LA diam:        4.20 cm 2.23 cm/m   RA Area:     16.70 cm LA Vol (A2C):   87.4 ml 46.47 ml/m  RA Volume:   47.90 ml  25.47 ml/m LA Vol (A4C):   60.4 ml 32.11 ml/m LA Biplane Vol: 77.5 ml 41.20 ml/m  AORTIC VALVE                     PULMONIC VALVE AV Area (Vmax):    2.00 cm      PV Vmax:       0.66 m/s AV Area (Vmean):   1.97 cm      PV Peak grad:  1.7 mmHg AV Area (VTI):     1.99 cm AV Vmax:           268.00 cm/s AV Vmean:          184.500 cm/s AV VTI:            0.718 m AV Peak Grad:      28.7 mmHg AV Mean Grad:      15.0 mmHg LVOT Vmax:         189.50 cm/s LVOT Vmean:        128.000 cm/s LVOT VTI:          0.505 m LVOT/AV VTI ratio: 0.70  AORTA Ao Root diam: 3.20 cm Ao Asc diam:  3.40 cm MITRAL VALVE                TRICUSPID VALVE MV Area (PHT): 1.86 cm     TR Peak grad:   23.6 mmHg MV Area VTI:   2.85 cm     TR Vmax:        243.00 cm/s MV Peak grad:  9.9 mmHg MV Mean grad:  3.0 mmHg     SHUNTS MV Vmax:  1.57 m/s     Systemic VTI:  0.50 m MV Vmean:      77.4 cm/s    Systemic Diam: 1.90 cm MV Decel Time: 407 msec MV E velocity: 95.50 cm/s MV A velocity: 137.00 cm/s MV E/A ratio:  0.70 Rozann Lesches MD Electronically signed by Rozann Lesches MD Signature Date/Time: 12/29/2021/11:51:31 AM    Final    MR BRAIN WO CONTRAST  Result Date: 12/29/2021 CLINICAL DATA:  Stroke, follow up EXAM: MRI HEAD WITHOUT CONTRAST TECHNIQUE: Multiplanar, multiecho pulse sequences of the brain and surrounding structures were obtained without intravenous contrast.  COMPARISON:  CTA head/neck from 12/28/2021. MRI head February 28, 2013. FINDINGS: Brain: No acute infarction, hemorrhage, hydrocephalus, extra-axial collection or mass lesion. Vascular: Major arterial flow voids are maintained at the skull base. Skull and upper cervical spine: Normal marrow signal. Sinuses/Orbits: Clear sinuses.  No acute orbital findings. Other: No mastoid effusions. IMPRESSION: No evidence of acute intracranial abnormality. Normal for age brain MRI. Electronically Signed   By: Margaretha Sheffield M.D.   On: 12/29/2021 08:29   CT ANGIO HEAD NECK W WO CM W PERF (CODE STROKE)  Result Date: 12/28/2021 CLINICAL DATA:  Neuro deficit, acute, stroke suspected dizziness, off balance, slurred speech EXAM: CT ANGIOGRAPHY HEAD AND NECK CT PERFUSION BRAIN TECHNIQUE: Multidetector CT imaging of the head and neck was performed using the standard protocol during bolus administration of intravenous contrast. Multiplanar CT image reconstructions and MIPs were obtained to evaluate the vascular anatomy. Carotid stenosis measurements (when applicable) are obtained utilizing NASCET criteria, using the distal internal carotid diameter as the denominator. Multiphase CT imaging of the brain was performed following IV bolus contrast injection. Subsequent parametric perfusion maps were calculated using RAPID software. RADIATION DOSE REDUCTION: This exam was performed according to the departmental dose-optimization program which includes automated exposure control, adjustment of the mA and/or kV according to patient size and/or use of iterative reconstruction technique. CONTRAST:  169m OMNIPAQUE IOHEXOL 350 MG/ML SOLN COMPARISON:  None Available. FINDINGS: CTA NECK Aortic arch: Mild calcified plaque. Great vessel origins are patent. Right carotid system: Patent.  No stenosis. Left carotid system: Patent. Minor calcified plaque along the common carotid and proximal ICA without stenosis. Vertebral arteries: Patent and  codominant. Mild plaque at the right vertebral origin without stenosis. Skeleton: Advanced degenerative changes of the included spine. Other neck: Enlarged, multinodular thyroid previously evaluated by ultrasound. Left thyroidectomy. Upper chest: No apical lung mass. Review of the MIP images confirms the above findings CTA HEAD Anterior circulation: Intracranial internal carotid arteries are patent with calcified plaque but no significant stenosis. Anterior and middle cerebral arteries are patent. Posterior circulation: Intracranial vertebral arteries are patent. Basilar artery is patent. Major cerebellar artery origins are patent. Posterior cerebral arteries are patent. Venous sinuses: Patent as allowed by contrast bolus timing. Review of the MIP images confirms the above findings CT Brain Perfusion Findings: CBF (<30%) Volume: 048mPerfusion (Tmax>6.0s) volume: 22m73mismatch Volume: 22mL32mfarction Location: Area of calculated penumbra is in the left temporal lobe. IMPRESSION: No large vessel occlusion, hemodynamically significant stenosis, or evidence of dissection. Perfusion imaging demonstrates no evidence of core infarction. 4 mL of penumbra in the left temporal lobe is likely artifactual. Electronically Signed   By: PranMacy Mis.   On: 12/28/2021 16:29   CT HEAD CODE STROKE WO CONTRAST  Result Date: 12/28/2021 CLINICAL DATA:  Code stroke.  Neuro deficit, acute, stroke suspected EXAM: CT HEAD WITHOUT CONTRAST TECHNIQUE: Contiguous axial images were obtained from  the base of the skull through the vertex without intravenous contrast. RADIATION DOSE REDUCTION: This exam was performed according to the departmental dose-optimization program which includes automated exposure control, adjustment of the mA and/or kV according to patient size and/or use of iterative reconstruction technique. COMPARISON:  2012 FINDINGS: Brain: No acute intracranial hemorrhage, mass effect, or edema. Gray-white differentiation is  preserved. Ventricles and sulci are within normal limits in size and configuration. No extra-axial collection. Vascular: No hyperdense vessel. There is intracranial atherosclerotic calcification at the skull base. Skull: Unremarkable. Sinuses/Orbits: No acute finding. Other: Mastoid air cells are clear. ASPECTS ([place] Stroke Program Early CT Score) - Ganglionic level infarction (caudate, lentiform nuclei, internal capsule, insula, M1-M3 cortex): 7 - Supraganglionic infarction (M4-M6 cortex): 3 Total score (0-10 with 10 being normal): 10 IMPRESSION: There is no acute intracranial hemorrhage or evidence of acute infarction. ASPECT score is 10. These results were called by telephone at the time of interpretation on 12/28/2021 at 4:09 pm to provider [facility] ZAMMIT , who verbally acknowledged these results. Electronically Signed   By: Macy Mis M.D.   On: 12/28/2021 16:10   US Carotid Duplex Bilateral  Result Date: 12/21/2021 CLINICAL DATA:  81 year old female with a history of facial numbness EXAM: BILATERAL CAROTID DUPLEX ULTRASOUND TECHNIQUE: Pearline Cables scale imaging, color Doppler and duplex ultrasound were performed of bilateral carotid and vertebral arteries in the neck. COMPARISON:  None Available. FINDINGS: Criteria: Quantification of carotid stenosis is based on velocity parameters that correlate the residual internal carotid diameter with NASCET-based stenosis levels, using the diameter of the distal internal carotid lumen as the denominator for stenosis measurement. The following velocity measurements were obtained: RIGHT ICA:  Systolic 86 cm/sec, Diastolic 24 cm/sec CCA:  70 cm/sec SYSTOLIC ICA/CCA RATIO:  1.2 ECA:  84 cm/sec LEFT ICA:  Systolic 60 cm/sec, Diastolic 16 cm/sec CCA:  314 cm/sec SYSTOLIC ICA/CCA RATIO:  0.6 ECA:  43 cm/sec Right Brachial SBP: Not acquired Left Brachial SBP: Not acquired RIGHT CAROTID ARTERY: No significant calcified disease of the right common carotid artery. Intermediate  waveform maintained. Heterogeneous plaque without significant calcifications at the right carotid bifurcation. Low resistance waveform of the right ICA. No significant tortuosity. RIGHT VERTEBRAL ARTERY: Antegrade flow with low resistance waveform. LEFT CAROTID ARTERY: No significant calcified disease of the left common carotid artery. Intermediate waveform maintained. Heterogeneous plaque at the left carotid bifurcation without significant calcifications. Low resistance waveform of the left ICA. LEFT VERTEBRAL ARTERY:  Antegrade flow with low resistance waveform. IMPRESSION: Color duplex indicates minimal heterogeneous plaque, with no hemodynamically significant stenosis by duplex criteria in the extracranial cerebrovascular circulation. Signed, Dulcy Fanny. Nadene Rubins, RPVI [facility] Electronically Signed   By: Corrie Mckusick D.O.   On: 12/21/2021 13:15   US BREAST LTD UNI RIGHT INC AXILLA  Result Date: 12/17/2021 CLINICAL DATA:  81 year old female for further evaluation of RIGHT breast mass identified on screening mammogram. EXAM: ULTRASOUND OF THE RIGHT BREAST COMPARISON:  Prior mammograms FINDINGS: Targeted ultrasound is performed, showing a 0.6 x 0.4 x 0.4 cm benign simple cyst at the 11 o'clock position of the RIGHT breast 4 cm from the nipple, corresponding to the screening study finding. No other sonographic abnormalities are identified within the UPPER-OUTER RIGHT breast. IMPRESSION: Benign cyst in the UPPER-OUTER RIGHT breast corresponding to the screening study finding. RECOMMENDATION: Bilateral screening mammogram in 1 year. I have discussed the findings and recommendations with the patient. If applicable, a reminder letter will be  sent to the patient regarding the next appointment. BI-RADS CATEGORY  2: Benign. Electronically Signed   By: Margarette Canada M.D.   On: 12/17/2021 13:16  MM 3D SCREEN BREAST BILATERAL  Result Date:  12/13/2021 CLINICAL DATA:  Screening. EXAM: DIGITAL SCREENING BILATERAL MAMMOGRAM WITH TOMOSYNTHESIS AND CAD TECHNIQUE: Bilateral screening digital craniocaudal and mediolateral oblique mammograms were obtained. Bilateral screening digital breast tomosynthesis was performed. The images were evaluated with computer-aided detection. COMPARISON:  Previous exam(s). ACR Breast Density Category b: There are scattered areas of fibroglandular density. FINDINGS: In the right breast, a possible mass warrants further evaluation. In the left breast, no findings suspicious for malignancy. IMPRESSION: Further evaluation is suggested for possible mass in the right breast. RECOMMENDATION: Ultrasound of the right breast. (Code:US-R-26M) The patient will be contacted regarding the findings, and additional imaging will be scheduled. BI-RADS CATEGORY  0: Incomplete. Need additional imaging evaluation and/or prior mammograms for comparison. Electronically Signed   By: Abelardo Diesel M.D.   On: 12/13/2021 09:27    Microbiology: Results for orders placed or performed during the hospital encounter of 12/28/21  Resp Panel by RT-PCR (Flu A&B, Covid) Anterior Nasal Swab     Status: None   Collection Time: 12/28/21  4:33 PM   Specimen: Anterior Nasal Swab  Result Value Ref Range Status   SARS Coronavirus 2 by RT PCR NEGATIVE NEGATIVE Final    Comment: (NOTE) SARS-CoV-2 target nucleic acids are NOT DETECTED.  The SARS-CoV-2 RNA is generally detectable in upper respiratory specimens during the acute phase of infection. The lowest concentration of SARS-CoV-2 viral copies this assay can detect is 138 copies/mL. A negative result does not preclude SARS-Cov-2 infection and should not be used as the sole basis for treatment or other patient management decisions. A negative result may occur with  improper specimen collection/handling, submission of specimen other than nasopharyngeal swab, presence of viral mutation(s) within  the areas targeted by this assay, and inadequate number of viral copies(<138 copies/mL). A negative result must be combined with clinical observations, patient history, and epidemiological information. The expected result is Negative.  Fact Sheet for Patients:  EntrepreneurPulse.com.au  Fact Sheet for Healthcare Providers:  IncredibleEmployment.be  This test is no t yet approved or cleared by the [place] FDA and  has been authorized for detection and/or diagnosis of SARS-CoV-2 by FDA under an Emergency Use Authorization (EUA). This EUA will remain  in effect (meaning this test can be used) for the duration of the COVID-19 declaration under Section 564(b)(1) of the Act, 21 U.S.C.section 360bbb-3(b)(1), unless the authorization is terminated  or revoked sooner.       Influenza A by PCR NEGATIVE NEGATIVE Final   Influenza B by PCR NEGATIVE NEGATIVE Final    Comment: (NOTE) The Xpert Xpress SARS-CoV-2/FLU/RSV plus assay is intended as an aid in the diagnosis of influenza from Nasopharyngeal swab specimens and should not be used as a sole basis for treatment. Nasal washings and aspirates are unacceptable for Xpert Xpress SARS-CoV-2/FLU/RSV testing.  Fact Sheet for Patients: EntrepreneurPulse.com.au  Fact Sheet for Healthcare Providers: IncredibleEmployment.be  This test is not yet approved or cleared by the [place] FDA and has been authorized for detection and/or diagnosis of SARS-CoV-2 by FDA under an Emergency Use Authorization (EUA). This EUA will remain in effect (meaning this test can be used) for the duration of the COVID-19 declaration under Section 564(b)(1) of the Act, 21 U.S.C. section 360bbb-3(b)(1), unless the authorization is terminated or revoked.  Performed at [facility]  [facility], [address], [place], [place] [address]     Labs: CBC: Recent Labs  Lab 12/28/21 1624  WBC 6.6   NEUTROABS 3.3  HGB 11.3*  HCT 33.3*  MCV 85.2  PLT 486*   Basic Metabolic Panel: Recent Labs  Lab 12/28/21 1624  NA 135  K 3.4*  CL 100  CO2 27  GLUCOSE 223*  BUN 24*  CREATININE 1.27*  CALCIUM 9.1   Liver Function Tests: Recent Labs  Lab 12/28/21 1624  AST 21  ALT 19  ALKPHOS 44  BILITOT 0.3  PROT 6.5  ALBUMIN 3.4*   CBG: Recent Labs  Lab 12/28/21 1557 12/28/21 2152 12/29/21 0714 12/29/21 1121  GLUCAP 244* 168* 138* 167*    Discharge time spent: greater than 30 minutes.  Signed: Deatra James, MD [facility] 12/29/2021

## 2021-12-29 NOTE — Evaluation (Signed)
Speech Language Pathology Evaluation Patient Details Name: Joanna Brown MRN: [id number] DOB: 1940-11-25 Today's Date: 12/29/2021 Time: 2542-7062 SLP Time Calculation (min) (ACUTE ONLY): 23 min  Problem List:  Patient Active Problem List   Diagnosis Date Noted   Stroke (cerebrum) ([place]) 12/28/2021   New onset of headaches 12/11/2021   Stage 3a chronic kidney disease (CKD) ([place]) 12/11/2021   PVD (peripheral vascular disease) ([place]) 08/03/2021   S/P TAVR (transcatheter aortic valve replacement) 03/23/2021   Hepatic cirrhosis ([place]) 08/05/2020   History of colon cancer 03/04/2020   GERD (gastroesophageal reflux disease) 03/04/2020   Nodular goiter 12/10/2019   Low back pain with left-sided sciatica 11/06/2019   Severe aortic stenosis 08/23/2013   Carotid bruit 06/11/2012   IRRITABLE BOWEL SYNDROME 08/24/2009   BILIARY DYSKINESIA 05/31/2009   Type 2 diabetes mellitus with stage 1 chronic kidney disease, with long-term current use of insulin ([place]) 08/06/2008   HLD (hyperlipidemia) 08/06/2008   Overweight (BMI 25.0-29.9) 08/06/2008   Essential hypertension 08/06/2008   Past Medical History:  Past Medical History:  Diagnosis Date   Anginal pain ([place])    Arthritis    OA   Diabetes mellitus    Dyspnea    Female bladder prolapse    GERD (gastroesophageal reflux disease)    Glaucoma    Hyperlipidemia    Hypertension    echo and stress 4/10 reports on chart, EKG ` LOV 9/12 on chart   S/P TAVR (transcatheter aortic valve replacement) 03/23/2021   Edwards 68m S3U TF approach with Dr. MAngelena Formand Dr. BCyndia Bent  Severe aortic stenosis    Thyroid disease    Past Surgical History:  Past Surgical History:  Procedure Laterality Date   ABDOMINAL HYSTERECTOMY     ANTERIOR AND POSTERIOR REPAIR  04/26/2011   Procedure: ANTERIOR (CYSTOCELE) AND POSTERIOR REPAIR (RECTOCELE);  Surgeon: SReece Packer MD;  Location: [facility];  Service: Urology;  Laterality: N/A;   BIOPSY  05/25/2020    Procedure: BIOPSY;  Surgeon: DDoran Stabler MD;  Location: [facility];  Service: Gastroenterology;;   CATARACT EXTRACTION Bilateral    with IOL   CHOLECYSTECTOMY  2009   COLONOSCOPY N/A 12/02/2013   three colon polyps removed, small internal hemorrhoids. Hyperplastic polyps   COLONOSCOPY N/A 11/20/2019   pancolonic diverticulosis, two 10-11 mm polyps in ascending colon, one 5 mm polyp in cecum, ascending colon with superficially invasive adenocarcinoma arising in background of sessile serrated polyps with low and high grade cytologic dysplasia.   COLONOSCOPY     COLONOSCOPY W/ POLYPECTOMY     COLONOSCOPY WITH PROPOFOL N/A 05/25/2020   diverticulosis in right colon, redundant colon. Caution warranted on future colonoscopy in light of age, cardiac condition, challenging anatomy.    DILATION AND CURETTAGE OF UTERUS     pt states this was in the 1970's   ESOPHAGOGASTRODUODENOSCOPY (EGD) WITH PROPOFOL N/A 05/25/2020   Grade 1 esophageal varices, single mucosal nodule in stomach s/p biopsy. (hyperplastic)>    LEFT HEART CATH  09/10/2008   normal coronary arteries, normal LV systolic function, EF 637%(Dr. [place]lene [place]uel   [place]   under arm   NM MYOCAR PERF WALL MOTION  2010   dipyridamole - mild-mod in intenstiy perfusion defect in mid anterior, mid anteroseptal wall, EF 70%   OVARY SURGERY     bilateral tumors removed   POLYPECTOMY  11/20/2019   Procedure: POLYPECTOMY;  Surgeon: RDaneil Dolin MD;  [facility];  Service: Endoscopy;;  hot and cold snare cecal polyp, and asending polyps x 2   RIGHT/LEFT HEART CATH AND CORONARY ANGIOGRAPHY N/A 10/02/2020   Procedure: RIGHT/LEFT HEART CATH AND CORONARY ANGIOGRAPHY;  Surgeon: Martinique, Peter M, MD;  Location: [place] [facility];  Service: Cardiovascular;  Laterality: N/A;   THYROIDECTOMY     THYROIDECTOMY  02/2008   TRANSCATHETER AORTIC VALVE REPLACEMENT, TRANSFEMORAL N/A 03/23/2021   Procedure:  TRANSCATHETER AORTIC VALVE REPLACEMENT, TRANSFEMORAL;  Surgeon: Burnell Blanks, MD;  Location: [place] [facility];  Service: Open Heart Surgery;  Laterality: N/A;   TRANSTHORACIC ECHOCARDIOGRAM  08/2011   EF=>55%, mild conc LVH; trace MR; mild TR; mild-mod AV calcification with mild valvular AV stenosis   VAGINAL PROLAPSE REPAIR  04/26/2011   Procedure: VAGINAL VAULT SUSPENSION;  Surgeon: Reece Packer, MD;  Location: [facility];  Service: Urology;  Laterality: N/A;  with Graft  10x6   HPI:  [name] is a 81 y.o. female with medical history significant of diabetes mellitus type 2, GERD, glaucoma, hyperlipidemia, hypertension, thyroid disease, transcatheter aortic valve replacement secondary to aortic stenosis, and more presents to the ED with a chief complaint of neck pain and ataxia.  Patient reports that this has been going on for a couple weeks.  It comes in episodes will last for minutes and then goes away.  Became acutely worse on day of presentation at 2 PM.  Patient had just finished fixing a meal when she had right neck pain.  She describes it as a pressure/burning.  Pain is resolved now.  She is not sure what made it go away.  She has no history of torticollis or muscle spasms in her neck.  She did not have any difficulty swallowing.  She did report difficulty speaking.  She felt like she could not get the words out.  She is not sure if her speech was slurred.  Patient reports that she cannot walk because she felt off balance like she might pass out.  She reports that she has had some dizziness episodes, and was recently started a work-up outpatient with ultrasound carotids done on August 8.  This did not feel like dizziness, but more like she might pass out.  Patient reports she had a headache with that it was located over her left temple and down her face.  She still feels a stiffness in her head but the pain is gone per her report.  She was nauseous but had no vomiting.  Patient  reports that her last bowel movement was on the day of presentation, and her last meal was at the time of symptom onset.  She is also had dysuria per her report and UA is pending. MRI negative for acute changes.   Assessment / Plan / Recommendation Clinical Impression  Pt seen at bedside for cognitive linguistic evaluation. She reports feeling close to baseline, but notes that she occasionally cannot think of the words and is slower to express herself. Pt presents with mild cognitive linguistic deficits characterized by short term memory deficits (4/4 immediate, 1/4 delayed recall, and 4/4 with cues). Functionally, she was able to relay current events that led up to her hospitalization and express complex thoughts. She lives with her husband and is independent with communication, driving, cooking, cleaning, medication management, and managing her daily appointments/routine. Pt endorses poor sleep over night which could be contributing to working memory deficits. Pt feels close to baseline and no further SLP services are indicated at this  time. She was encouraged to follow up with her PCP (Dr. Moshe Cipro) if she feels that she does not return to baseline and desires outpatient SLP services. Pt is in agreement with plan of care. SLP will sign off.    SLP Assessment  SLP Recommendation/Assessment: Patient does not need any further Speech [place] [facility] SLP Visit Diagnosis: Cognitive communication deficit (R41.841)    Recommendations for follow up therapy are one component of a multi-disciplinary discharge planning process, led by the attending physician.  Recommendations may be updated based on patient status, additional functional criteria and insurance authorization.    Follow Up Recommendations  No SLP follow up    Assistance Recommended at Discharge  None  Functional Status Assessment Patient has not had a recent decline in their functional status  Frequency and Duration            SLP Evaluation Cognition  Overall Cognitive Status: Impaired/Different from baseline Arousal/Alertness: Awake/alert Orientation Level: Oriented to person;Oriented to place;Oriented to situation;Disoriented to time (off date by 1) Year: 2023 Month: August Day of Week: Correct Memory: Impaired Memory Impairment: Storage deficit;Retrieval deficit (4/4 immediate recall, 1/4 delayed, 4/4 with cues delayed) Awareness: Appears intact Problem Solving: Appears intact Safety/Judgment: Appears intact       Comprehension  Auditory Comprehension Overall Auditory Comprehension: Appears within functional limits for tasks assessed Yes/No Questions: Within Functional Limits Commands: Within Functional Limits Conversation: Complex Visual Recognition/Discrimination Discrimination: Within Function Limits Reading Comprehension Reading Status: Within funtional limits    Expression Expression Primary Mode of Expression: Verbal Verbal Expression Overall Verbal Expression: Appears within functional limits for tasks assessed Initiation: No impairment Automatic Speech: Name;Social Response;Counting Level of Generative/Spontaneous Verbalization: Conversation Repetition: No impairment Naming: No impairment Pragmatics: No impairment Non-Verbal Means of Communication: Not applicable Written Expression Dominant Hand: Right Written Expression: Not tested   Oral / Motor  Oral Motor/Sensory Function Overall Oral Motor/Sensory Function: Within functional limits Motor Speech Overall Motor Speech: Appears within functional limits for tasks assessed Respiration: Within functional limits Phonation: Normal Resonance: Within functional limits Articulation: Within functional limitis Intelligibility: Intelligible Motor Planning: Witnin functional limits Motor Speech Errors: Not applicable           Thank you,  Genene Churn, [place]  [place] 12/29/2021, 12:57 PM

## 2021-12-29 NOTE — Hospital Course (Addendum)
Joanna Brown is a 81 y.o. female with medical history significant of diabetes mellitus type 2, GERD, glaucoma, hyperlipidemia, hypertension, thyroid disease, transcatheter aortic valve replacement secondary to aortic stenosis, and more presents to the ED with a chief complaint of neck pain and ataxia. Patient reports that this has been going on for a couple weeks.  It comes in episodes will last for minutes and then goes away.  Became acutely worse on day of presentation at 2 PM.  Patient had just finished fixing a meal when she had right neck pain.  She describes it as a pressure/burning.  Pain is resolved now.  She is not sure what made it go away.  She has no history of torticollis or muscle spasms in her neck.  She did not have any difficulty swallowing.  She did report difficulty speaking.  She felt like she could not get the words out.  She is not sure if her speech was slurred.  Patient reports that she cannot walk because she felt off balance like she might pass out.  She reports that she has had some dizziness episodes, and was recently started a work-up outpatient with ultrasound carotids done on August 8.  This did not feel like dizziness, but more like she might pass out.  Patient reports she had a headache with that it was located over her left temple and down her face.  She still feels a stiffness in her head but the pain is gone per her report.  She was nauseous but had no vomiting.  Patient reports that her last bowel movement was on the day of presentation, and her last meal was at the time of symptom onset.  She is also had dysuria per her report and UA is pending.  Patient has no other complaints at this time.  She feels that she is mostly back to normal at this point.   Patient does not smoke, does not drink, does not use illicit drugs.  Patient is vaccinated for COVID.  Patient is full code.  ED Temp 98.5, heart rate 64-74, respiratory rate 13-23, blood pressure 110/52-136/73, satting  at 97% No leukocytosis with a white blood cell count of 6.6, hemoglobin 11.3 Slight hypokalemia at 3.4 Creatinine is close to baseline at 1.27 with baseline being 1.1 CT head shows no evidence of cord infarction.  For millimeter penumbra in left temporal lobe is likely artifactual CTA head shows no large vessel occlusion Initial NIHSS was 5, at admission NIHSS is 2 Neuro saw patient recommends 48 hours of permissive hypertension, 3 weeks of aspirin and Plavix, TTE, A1c, LDL On August 8 patient had ultrasound carotid done that showed minimal plaque with no hemodynamically significant stenosis by duplex criteria Admission requested for stroke work-up

## 2021-12-29 NOTE — Evaluation (Signed)
Physical Therapy Evaluation Patient Details Name: CYRIL RAILEY MRN: [id number] DOB: 1941-02-08 Today's Date: 12/29/2021  History of Present Illness  CAEDYN TASSINARI is a 81 y.o. female with medical history significant of diabetes mellitus type 2, GERD, glaucoma, hyperlipidemia, hypertension, thyroid disease, transcatheter aortic valve replacement secondary to aortic stenosis, and more presents to the ED with a chief complaint of neck pain and ataxia.  Patient reports that this has been going on for a couple weeks.  It comes in episodes will last for minutes and then goes away.  Became acutely worse on day of presentation at 2 PM.  Patient had just finished fixing a meal when she had right neck pain.  She describes it as a pressure/burning.  Pain is resolved now.  She is not sure what made it go away.  She has no history of torticollis or muscle spasms in her neck.  She did not have any difficulty swallowing.  She did report difficulty speaking.  She felt like she could not get the words out.  She is not sure if her speech was slurred.  Patient reports that she cannot walk because she felt off balance like she might pass out.  She reports that she has had some dizziness episodes, and was recently started a work-up outpatient with ultrasound carotids done on August 8.  This did not feel like dizziness, but more like she might pass out.  Patient reports she had a headache with that it was located over her left temple and down her face.  She still feels a stiffness in her head but the pain is gone per her report.  She was nauseous but had no vomiting.  Patient reports that her last bowel movement was on the day of presentation, and her last meal was at the time of symptom onset.  She is also had dysuria per her report and UA is pending.  Patient has no other complaints at this time.  She feels that she is mostly back to normal at this point.   Clinical Impression  Patient functioning at baseline for  functional mobility and gait demonstrating good return for bed mobility, transfers and ambulation in room/hallways without loss of balance.  Plan:  Patient discharged from physical therapy to care of nursing for ambulation daily as tolerated for length of stay.         Recommendations for follow up therapy are one component of a multi-disciplinary discharge planning process, led by the attending physician.  Recommendations may be updated based on patient status, additional functional criteria and insurance authorization.  Follow Up Recommendations No PT follow up      Assistance Recommended at Discharge PRN  Patient can return home with the following  Other (comment);Help with stairs or ramp for entrance (near baseline)    Equipment Recommendations None recommended by PT  Recommendations for Other Services       Functional Status Assessment Patient has not had a recent decline in their functional status     Precautions / Restrictions Precautions Precautions: None Restrictions Weight Bearing Restrictions: No      Mobility  Bed Mobility Overal bed mobility: Independent                  Transfers Overall transfer level: Independent                      Ambulation/Gait Ambulation/Gait assistance: Modified independent (Device/Increase time) Gait Distance (Feet): 120 Feet Assistive device: None Gait  Pattern/deviations: WFL(Within Functional Limits) Gait velocity: slightly decreased     General Gait Details: grossly WFL demonstrating slightly labored movement with good return for ambulation in room and hallways without loss of balance  Stairs            Wheelchair Mobility    Modified Rankin (Stroke Patients Only)       Balance Overall balance assessment: No apparent balance deficits (not formally assessed)                                           Pertinent Vitals/Pain Pain Assessment Pain Assessment: No/denies pain     Home Living Family/patient expects to be discharged to:: Private residence Living Arrangements: Spouse/significant other Available Help at Discharge: Family;Available 24 hours/day Type of Home: Apartment Home Access: Level entry       Home Layout: One level Home Equipment: None      Prior Function Prior Level of Function : Independent/Modified Independent             Mobility Comments: Hydrographic surveyor without AD ADLs Comments: Independent ADL and IADL's     Hand Dominance   Dominant Hand: Right    Extremity/Trunk Assessment   Upper Extremity Assessment Upper Extremity Assessment: Defer to OT evaluation RUE Deficits / Details: WFL strength. Decreased sensation elbow to hand. Pt reports  history of neuropothy. RUE Sensation: decreased light touch RUE Coordination: WNL    Lower Extremity Assessment Lower Extremity Assessment: Overall WFL for tasks assessed    Cervical / Trunk Assessment Cervical / Trunk Assessment: Normal  Communication   Communication: No difficulties  Cognition Arousal/Alertness: Awake/alert Behavior During Therapy: WFL for tasks assessed/performed Overall Cognitive Status: Within Functional Limits for tasks assessed                                          General Comments      Exercises     Assessment/Plan    PT Assessment Patient does not need any further PT services  PT Problem List         PT Treatment Interventions      PT Goals (Current goals can be found in the Care Plan section)  Acute Rehab PT Goals Patient Stated Goal: return home with family to assist PT Goal Formulation: With patient Time For Goal Achievement: 12/29/21 Potential to Achieve Goals: Good    Frequency       Co-evaluation PT/OT/SLP Co-Evaluation/Treatment: Yes Reason for Co-Treatment: To address functional/ADL transfers PT goals addressed during session: Mobility/safety with mobility;Balance OT goals addressed during  session: ADL's and self-care       AM-PAC PT "6 Clicks" Mobility  Outcome Measure Help needed turning from your back to your side while in a flat bed without using bedrails?: None Help needed moving from lying on your back to sitting on the side of a flat bed without using bedrails?: None Help needed moving to and from a bed to a chair (including a wheelchair)?: None Help needed standing up from a chair using your arms (e.g., wheelchair or bedside chair)?: None Help needed to walk in hospital room?: None Help needed climbing 3-5 steps with a railing? : None 6 Click Score: 24    End of Session   Activity Tolerance: Patient tolerated treatment well  Patient left: in bed;with call bell/phone within reach Nurse Communication: Mobility status PT Visit Diagnosis: Unsteadiness on feet (R26.81);Other abnormalities of gait and mobility (R26.89);Muscle weakness (generalized) (M62.81)    Time: 1478-2956 PT Time Calculation (min) (ACUTE ONLY): 11 min   Charges:   PT Evaluation $PT Eval Low Complexity: 1 Low PT Treatments $Therapeutic Activity: 8-22 mins        12:26 PM, 12/29/21 Lonell Grandchild, MPT Physical Therapist with [facility] [phone] office [phone] mobile phone

## 2021-12-29 NOTE — Progress Notes (Signed)
  Transition of Care [facility]) Screening Note   Patient Details  Name: Joanna Brown Date of Birth: 01-12-1941   Transition of Care [facility]) CM/SW Contact:    Iona Beard, [place] Phone Number: 12/29/2021, 11:21 AM    Transition of Care Department [facility]) has reviewed patient and no TOC needs have been identified at this time. We will continue to monitor patient advancement through interdisciplinary progression rounds. If new patient transition needs arise, please place a TOC consult.

## 2021-12-30 LAB — URINE CULTURE: Culture: NO GROWTH

## 2021-12-31 ENCOUNTER — Telehealth: Payer: Self-pay

## 2021-12-31 NOTE — Telephone Encounter (Signed)
Transition Care Management Unsuccessful Follow-up Telephone Call  Date of discharge and from where:  12/29/21 [place]  Attempts:  1st Attempt  Reason for unsuccessful TCM follow-up call:  Left voice message

## 2022-01-04 ENCOUNTER — Other Ambulatory Visit: Payer: Self-pay | Admitting: Family Medicine

## 2022-01-04 ENCOUNTER — Other Ambulatory Visit: Payer: Self-pay | Admitting: Nurse Practitioner

## 2022-01-04 ENCOUNTER — Other Ambulatory Visit: Payer: Self-pay | Admitting: "Endocrinology

## 2022-01-18 NOTE — Addendum Note (Signed)
Addended by: Eual Fines on: 01/18/2022 04:50 PM   Modules accepted: Orders, Level of Service

## 2022-01-28 ENCOUNTER — Ambulatory Visit (INDEPENDENT_AMBULATORY_CARE_PROVIDER_SITE_OTHER): Payer: Medicare Other | Admitting: Family Medicine

## 2022-01-28 ENCOUNTER — Encounter: Payer: Self-pay | Admitting: Family Medicine

## 2022-01-28 VITALS — BP 146/72 | HR 76 | Ht 65.0 in | Wt 185.1 lb

## 2022-01-28 DIAGNOSIS — E782 Mixed hyperlipidemia: Secondary | ICD-10-CM | POA: Diagnosis not present

## 2022-01-28 DIAGNOSIS — I639 Cerebral infarction, unspecified: Secondary | ICD-10-CM | POA: Diagnosis not present

## 2022-01-28 DIAGNOSIS — E1122 Type 2 diabetes mellitus with diabetic chronic kidney disease: Secondary | ICD-10-CM

## 2022-01-28 DIAGNOSIS — Z09 Encounter for follow-up examination after completed treatment for conditions other than malignant neoplasm: Secondary | ICD-10-CM | POA: Diagnosis not present

## 2022-01-28 DIAGNOSIS — I10 Essential (primary) hypertension: Secondary | ICD-10-CM

## 2022-01-28 DIAGNOSIS — N181 Chronic kidney disease, stage 1: Secondary | ICD-10-CM | POA: Diagnosis not present

## 2022-01-28 DIAGNOSIS — Z1231 Encounter for screening mammogram for malignant neoplasm of breast: Secondary | ICD-10-CM

## 2022-01-28 DIAGNOSIS — N1831 Chronic kidney disease, stage 3a: Secondary | ICD-10-CM | POA: Diagnosis not present

## 2022-01-28 DIAGNOSIS — Z794 Long term (current) use of insulin: Secondary | ICD-10-CM

## 2022-01-28 NOTE — Assessment & Plan Note (Signed)
Hospitalized in 08/15 to 12/29/2021, needs Neurology follow up, notes memory loss can describe what a person does , where  They live etc but poor name recall

## 2022-01-28 NOTE — Patient Instructions (Addendum)
F/U early December , call if you need me sooner  You are referred to Neurology, since you recently had a stroke , that office should call with appt  We will get you an appointment for kidney specialist , nephrology before you leave  Start clonidine 0.1 mg one at bedtime for blood pressure control please  Please schedule December mammogram at checkout  Fasting lipid, cmp and eGFR 5 days before follow up  Thanks for choosing [facility], we consider it a privelige to serve you.

## 2022-01-31 ENCOUNTER — Telehealth: Payer: Self-pay

## 2022-01-31 ENCOUNTER — Encounter: Payer: Self-pay | Admitting: *Deleted

## 2022-01-31 DIAGNOSIS — Z09 Encounter for follow-up examination after completed treatment for conditions other than malignant neoplasm: Secondary | ICD-10-CM | POA: Insufficient documentation

## 2022-01-31 NOTE — Telephone Encounter (Signed)
PA done for pt's  Linzess 290 mcg. Dx used: K59.00 and K 58.1. sent to Cover My Meds. Waiting on a response.

## 2022-01-31 NOTE — Assessment & Plan Note (Signed)
Uncontrolled , add clonidine 0.1 mg at bedime DASH diet and commitment to daily physical activity for a minimum of 30 minutes discussed and encouraged, as a part of hypertension management. The importance of attaining a healthy weight is also discussed.     01/28/2022    9:59 AM 01/28/2022    9:24 AM 01/28/2022    9:23 AM 12/29/2021   12:06 PM 12/29/2021    6:20 AM 12/29/2021    4:00 AM 12/29/2021    1:36 AM  BP/Weight  Systolic BP 343 568 616 837 290 211 155  Diastolic BP 72 72 72 71 72 75 71  Wt. (Lbs)   185.08      BMI   30.8 kg/m2

## 2022-01-31 NOTE — Assessment & Plan Note (Signed)
Hyperlipidemia:Low fat diet discussed and encouraged.   Lipid Panel  Lab Results  Component Value Date   CHOL 142 12/29/2021   HDL 34 (L) 12/29/2021   LDLCALC 59 12/29/2021   TRIG 243 (H) 12/29/2021   CHOLHDL 4.2 12/29/2021   Uncontrolled , needs to reduce fat and may need to change from zetia to fenofibrate

## 2022-01-31 NOTE — Telephone Encounter (Signed)
Pt approved for Linzess from 01/01/2022 to 01/31/2023. Will give to Manuela Schwartz to scan into pt's chart and notify the pharmacy.

## 2022-01-31 NOTE — Assessment & Plan Note (Signed)
Patient in for follow up of recent hospitalization. Discharge summary, and laboratory and radiology data are reviewed, and any questions or concerns  are discussed. Specific issues requiring follow up are specifically addressed.  

## 2022-01-31 NOTE — Progress Notes (Signed)
Joanna Brown     MRN: [id number]      DOB: [birth year]   HPI Joanna Brown is here for follow up of recent hospitalization from 8/15 to 12/29/2021, with a dx of stroke and re-evaluation of chronic medical conditions, medication management and review of any available recent lab and radiology data.  Preventive health is updated, specifically  Cancer screening and Immunization.   Needs both neurology and nephrology appts. The PT denies any adverse reactions to current medications since the last visit.  Denies polyuria, polydipsia, blurred vision , or hypoglycemic episodes.   ROS Denies recent fever or chills. Denies sinus pressure, nasal congestion, ear pain or sore throat. Denies chest congestion, productive cough or wheezing. Denies chest pains, palpitations and leg swelling Denies abdominal pain, nausea, vomiting,diarrhea or constipation.   Denies dysuria, frequency, hesitancy or incontinence. Denies uncontrolled joint pain, swelling and limitation in mobility. .Reports poor recall of names, c/o headaches and blurred vision Denies depression, anxiety or insomnia. Denies skin break down or rash.   PE  BP (!) 146/72   Pulse 76   Ht '5\' 5"'$  (1.651 m)   Wt 185 lb 1.3 oz (84 kg)   SpO2 97%   BMI 30.80 kg/m   Patient alert and oriented and in no cardiopulmonary distress.  HEENT: No facial asymmetry, EOMI,     Neck supple .  Chest: Clear to auscultation bilaterally.  CVS: S1, S2, no S3.Regular rate.  ABD: Soft non tender.   Ext: No edema  MS: Adequate ROM spine, shoulders, hips and knees.  Skin: Intact, no ulcerations or rash noted.  Psych: Good eye contact, normal affect. Memory intact not anxious or depressed appearing.  CNS: CN 2-12 intact, power,  normal throughout.no focal deficits noted.   Assessment & Plan  Stroke (cerebrum) [facility]) Hospitalized in 08/15 to 12/29/2021, needs Neurology follow up, notes memory loss can describe what a person does , where  They  live etc but poor name recall  Essential hypertension Uncontrolled , add clonidine 0.1 mg at bedime DASH diet and commitment to daily physical activity for a minimum of 30 minutes discussed and encouraged, as a part of hypertension management. The importance of attaining a healthy weight is also discussed.     01/28/2022    9:59 AM 01/28/2022    9:24 AM 01/28/2022    9:23 AM 12/29/2021   12:06 PM 12/29/2021    6:20 AM 12/29/2021    4:00 AM 12/29/2021    1:36 AM  BP/Weight  Systolic BP 127 517 001 749 449 675 916  Diastolic BP 72 72 72 71 72 75 71  Wt. (Lbs)   185.08      BMI   30.8 kg/m2           Stage 3a chronic kidney disease (CKD) ([place]) Needs appt with nephrology, rappt still outstanding will follow up  Type 2 diabetes mellitus with stage 1 chronic kidney disease, with long-term current use of insulin ([place]) Joanna Brown is reminded of the importance of commitment to daily physical activity for 30 minutes or more, as able and the need to limit carbohydrate intake to 30 to 60 grams per meal to help with blood sugar control.   The need to take medication as prescribed, test blood sugar as directed, and to call between visits if there is a concern that blood sugar is uncontrolled is also discussed.   Joanna Brown is reminded of the importance of daily foot exam, annual eye examination,  and good blood sugar, blood pressure and cholesterol control. Managed by Endo     Latest Ref Rng & Units 12/29/2021    4:43 AM 12/28/2021    4:24 PM 12/14/2021    8:12 AM 12/02/2021    9:25 AM 09/01/2021    9:32 AM  Diabetic Labs  HbA1c 0.0 - 7.0 %    7.5  7.1   Chol 0 - 200 mg/dL 142   159     HDL >40 mg/dL 34   35     Calc LDL 0 - 99 mg/dL 59   90     Triglycerides <150 mg/dL 243   197     Creatinine 0.44 - 1.00 mg/dL  1.27  1.04         01/28/2022    9:59 AM 01/28/2022    9:24 AM 01/28/2022    9:23 AM 12/29/2021   12:06 PM 12/29/2021    6:20 AM 12/29/2021    4:00 AM 12/29/2021    1:36 AM   BP/Weight  Systolic BP 962 229 798 921 194 174 081  Diastolic BP 72 72 72 71 72 75 71  Wt. (Lbs)   185.08      BMI   30.8 kg/m2          Latest Ref Rng & Units 08/03/2021    9:20 AM 03/09/2021   12:00 AM  Foot/eye exam completion dates  Eye Exam No Retinopathy  No Retinopathy      Foot Form Completion  Done      This result is from an external source.        HLD (hyperlipidemia) Hyperlipidemia:Low fat diet discussed and encouraged.   Lipid Panel  Lab Results  Component Value Date   CHOL 142 12/29/2021   HDL 34 (L) 12/29/2021   LDLCALC 59 12/29/2021   TRIG 243 (H) 12/29/2021   CHOLHDL 4.2 12/29/2021   Uncontrolled , needs to reduce fat and may need to change from zetia to fenofibrate    Hospital discharge follow-up Patient in for follow up of recent hospitalization. Discharge summary, and laboratory and radiology data are reviewed, and any questions or concerns  are discussed. Specific issues requiring follow up are specifically addressed.

## 2022-01-31 NOTE — Assessment & Plan Note (Signed)
Needs appt with nephrology, rappt still outstanding will follow up

## 2022-01-31 NOTE — Assessment & Plan Note (Signed)
Ms. Wivell is reminded of the importance of commitment to daily physical activity for 30 minutes or more, as able and the need to limit carbohydrate intake to 30 to 60 grams per meal to help with blood sugar control.   The need to take medication as prescribed, test blood sugar as directed, and to call between visits if there is a concern that blood sugar is uncontrolled is also discussed.   Ms. Pfahler is reminded of the importance of daily foot exam, annual eye examination, and good blood sugar, blood pressure and cholesterol control. Managed by Endo     Latest Ref Rng & Units 12/29/2021    4:43 AM 12/28/2021    4:24 PM 12/14/2021    8:12 AM 12/02/2021    9:25 AM 09/01/2021    9:32 AM  Diabetic Labs  HbA1c 0.0 - 7.0 %    7.5  7.1   Chol 0 - 200 mg/dL 142   159     HDL >40 mg/dL 34   35     Calc LDL 0 - 99 mg/dL 59   90     Triglycerides <150 mg/dL 243   197     Creatinine 0.44 - 1.00 mg/dL  1.27  1.04         01/28/2022    9:59 AM 01/28/2022    9:24 AM 01/28/2022    9:23 AM 12/29/2021   12:06 PM 12/29/2021    6:20 AM 12/29/2021    4:00 AM 12/29/2021    1:36 AM  BP/Weight  Systolic BP 626 948 546 270 350 093 818  Diastolic BP 72 72 72 71 72 75 71  Wt. (Lbs)   185.08      BMI   30.8 kg/m2          Latest Ref Rng & Units 08/03/2021    9:20 AM 03/09/2021   12:00 AM  Foot/eye exam completion dates  Eye Exam No Retinopathy  No Retinopathy      Foot Form Completion  Done      This result is from an external source.

## 2022-02-02 ENCOUNTER — Telehealth: Payer: Self-pay

## 2022-02-02 NOTE — Telephone Encounter (Signed)
Pt called and advised me that you and her had a discussion about her liver being hard. She is wanting a to see a liver specialist. I advised her that she would need a referral. So pt advises she wants to see a liver specialist. Please advise

## 2022-02-02 NOTE — Telephone Encounter (Signed)
You can let her know we address cirrhosis here as well. We do ultrasounds every 6 months. If she is ok with that, I would love to continue seeing her here for her care.

## 2022-02-02 NOTE — Telephone Encounter (Signed)
Phoned and spoke with the pt and she is advising that she would rather see another Dr for this because we haven't done anything for her. I advised about the different testing and labs but she states she wants to be followed better and more closely than she has been here. Please advise

## 2022-02-03 ENCOUNTER — Other Ambulatory Visit: Payer: Self-pay | Admitting: "Endocrinology

## 2022-02-03 NOTE — Telephone Encounter (Signed)
That is perfectly fine as she desires this. Per my last note, she had desired a conservative approach, so that is what we have been doing.   Joanna Brown, you can let her know that she can reach out to [facility] and see if they would be willing to see her as a patient. Unfortunately, we have been unable to do recent endoscopic evaluations here as she was not a candidate for anesthesia at [facility].

## 2022-02-03 NOTE — Telephone Encounter (Signed)
FYI:  Phoned and spoke with the pt and advised her of Anna's note. Pt did remember that she was not a candidate. Pt was given the information to contact [facility]

## 2022-02-07 ENCOUNTER — Other Ambulatory Visit: Payer: Self-pay | Admitting: "Endocrinology

## 2022-02-07 DIAGNOSIS — E1122 Type 2 diabetes mellitus with diabetic chronic kidney disease: Secondary | ICD-10-CM

## 2022-02-16 ENCOUNTER — Other Ambulatory Visit (HOSPITAL_COMMUNITY): Payer: Self-pay | Admitting: Nephrology

## 2022-02-16 DIAGNOSIS — E1122 Type 2 diabetes mellitus with diabetic chronic kidney disease: Secondary | ICD-10-CM | POA: Diagnosis not present

## 2022-02-16 DIAGNOSIS — I350 Nonrheumatic aortic (valve) stenosis: Secondary | ICD-10-CM | POA: Diagnosis not present

## 2022-02-16 DIAGNOSIS — I129 Hypertensive chronic kidney disease with stage 1 through stage 4 chronic kidney disease, or unspecified chronic kidney disease: Secondary | ICD-10-CM | POA: Diagnosis not present

## 2022-02-16 DIAGNOSIS — D638 Anemia in other chronic diseases classified elsewhere: Secondary | ICD-10-CM

## 2022-02-16 DIAGNOSIS — E1129 Type 2 diabetes mellitus with other diabetic kidney complication: Secondary | ICD-10-CM | POA: Diagnosis not present

## 2022-02-16 DIAGNOSIS — I5032 Chronic diastolic (congestive) heart failure: Secondary | ICD-10-CM | POA: Diagnosis not present

## 2022-02-16 DIAGNOSIS — N189 Chronic kidney disease, unspecified: Secondary | ICD-10-CM | POA: Diagnosis not present

## 2022-02-16 DIAGNOSIS — D696 Thrombocytopenia, unspecified: Secondary | ICD-10-CM | POA: Diagnosis not present

## 2022-02-16 DIAGNOSIS — Z6830 Body mass index (BMI) 30.0-30.9, adult: Secondary | ICD-10-CM | POA: Diagnosis not present

## 2022-02-16 DIAGNOSIS — R809 Proteinuria, unspecified: Secondary | ICD-10-CM | POA: Diagnosis not present

## 2022-02-16 DIAGNOSIS — E876 Hypokalemia: Secondary | ICD-10-CM | POA: Diagnosis not present

## 2022-02-22 DIAGNOSIS — I350 Nonrheumatic aortic (valve) stenosis: Secondary | ICD-10-CM | POA: Diagnosis not present

## 2022-02-22 DIAGNOSIS — Z1159 Encounter for screening for other viral diseases: Secondary | ICD-10-CM | POA: Diagnosis not present

## 2022-02-22 DIAGNOSIS — E876 Hypokalemia: Secondary | ICD-10-CM | POA: Diagnosis not present

## 2022-02-22 DIAGNOSIS — E559 Vitamin D deficiency, unspecified: Secondary | ICD-10-CM | POA: Diagnosis not present

## 2022-02-22 DIAGNOSIS — N189 Chronic kidney disease, unspecified: Secondary | ICD-10-CM | POA: Diagnosis not present

## 2022-02-22 DIAGNOSIS — E1129 Type 2 diabetes mellitus with other diabetic kidney complication: Secondary | ICD-10-CM | POA: Diagnosis not present

## 2022-02-22 DIAGNOSIS — D696 Thrombocytopenia, unspecified: Secondary | ICD-10-CM | POA: Diagnosis not present

## 2022-02-22 DIAGNOSIS — I129 Hypertensive chronic kidney disease with stage 1 through stage 4 chronic kidney disease, or unspecified chronic kidney disease: Secondary | ICD-10-CM | POA: Diagnosis not present

## 2022-02-22 DIAGNOSIS — D638 Anemia in other chronic diseases classified elsewhere: Secondary | ICD-10-CM | POA: Diagnosis not present

## 2022-02-22 DIAGNOSIS — E1122 Type 2 diabetes mellitus with diabetic chronic kidney disease: Secondary | ICD-10-CM | POA: Diagnosis not present

## 2022-02-22 DIAGNOSIS — R809 Proteinuria, unspecified: Secondary | ICD-10-CM | POA: Diagnosis not present

## 2022-02-22 DIAGNOSIS — Z79899 Other long term (current) drug therapy: Secondary | ICD-10-CM | POA: Diagnosis not present

## 2022-02-22 DIAGNOSIS — I5032 Chronic diastolic (congestive) heart failure: Secondary | ICD-10-CM | POA: Diagnosis not present

## 2022-02-22 DIAGNOSIS — D519 Vitamin B12 deficiency anemia, unspecified: Secondary | ICD-10-CM | POA: Diagnosis not present

## 2022-02-25 ENCOUNTER — Ambulatory Visit: Payer: Medicare Other | Admitting: Diagnostic Neuroimaging

## 2022-02-28 ENCOUNTER — Ambulatory Visit (HOSPITAL_COMMUNITY)
Admission: RE | Admit: 2022-02-28 | Discharge: 2022-02-28 | Disposition: A | Discharge status: Home / Self Care | Payer: Medicare Other | Source: Ambulatory Visit | Attending: Nephrology | Admitting: Nephrology

## 2022-02-28 DIAGNOSIS — E1122 Type 2 diabetes mellitus with diabetic chronic kidney disease: Secondary | ICD-10-CM | POA: Diagnosis not present

## 2022-02-28 DIAGNOSIS — I129 Hypertensive chronic kidney disease with stage 1 through stage 4 chronic kidney disease, or unspecified chronic kidney disease: Secondary | ICD-10-CM

## 2022-02-28 DIAGNOSIS — D638 Anemia in other chronic diseases classified elsewhere: Secondary | ICD-10-CM

## 2022-02-28 DIAGNOSIS — R809 Proteinuria, unspecified: Secondary | ICD-10-CM | POA: Insufficient documentation

## 2022-02-28 DIAGNOSIS — E1129 Type 2 diabetes mellitus with other diabetic kidney complication: Secondary | ICD-10-CM

## 2022-03-02 ENCOUNTER — Ambulatory Visit (INDEPENDENT_AMBULATORY_CARE_PROVIDER_SITE_OTHER): Payer: Medicare Other | Admitting: Diagnostic Neuroimaging

## 2022-03-02 ENCOUNTER — Encounter: Payer: Self-pay | Admitting: Diagnostic Neuroimaging

## 2022-03-02 VITALS — BP 162/81 | HR 70 | Ht 65.0 in | Wt 186.0 lb

## 2022-03-02 DIAGNOSIS — G459 Transient cerebral ischemic attack, unspecified: Secondary | ICD-10-CM

## 2022-03-02 DIAGNOSIS — E782 Mixed hyperlipidemia: Secondary | ICD-10-CM | POA: Diagnosis not present

## 2022-03-02 DIAGNOSIS — E1122 Type 2 diabetes mellitus with diabetic chronic kidney disease: Secondary | ICD-10-CM | POA: Diagnosis not present

## 2022-03-02 DIAGNOSIS — Z794 Long term (current) use of insulin: Secondary | ICD-10-CM | POA: Diagnosis not present

## 2022-03-02 DIAGNOSIS — I639 Cerebral infarction, unspecified: Secondary | ICD-10-CM | POA: Diagnosis not present

## 2022-03-02 DIAGNOSIS — N181 Chronic kidney disease, stage 1: Secondary | ICD-10-CM | POA: Diagnosis not present

## 2022-03-02 NOTE — Patient Instructions (Addendum)
  TRANSIENT SLURRED SPEECH, ATAXIA, DIZZINESS (possible TIA) - continue plavix '75mg'$  daily, DM control, BP control, statin  MEMORY LOSS  - likely MCI vs normal aging; no major changes in ADLs - safety / supervision issues reviewed - daily physical activity / exercise (at least 15-30 minutes) - eat more plants / vegetables - increase social activities, brain stimulation, games, puzzles, hobbies, crafts, arts, music - aim for at least 7-8 hours sleep per night (or more) - avoid smoking and alcohol - caution with medications, finances, driving

## 2022-03-02 NOTE — Progress Notes (Signed)
[facility]  PATIENT: Joanna Brown DOB: June 05, 1940  REFERRING CLINICIAN: Fayrene Helper, MD HISTORY FROM: PATIENT REASON FOR VISIT: NEW CONSULT   HISTORICAL  CHIEF COMPLAINT:  Chief Complaint  Patient presents with   Cerebrovascular Accident    RM 6 with spouse [name] Pt is well and stable, reports states certain times of the day she can think as well. No other concerns     HISTORY OF PRESENT ILLNESS:   81 year old female with diabetes, hyperlipidemia, hypertension, transcatheter aortic valve replacement, presented to hospital for slurred speech and dizziness.  Patient was admitted for stroke work-up.  MRI was negative for any acute findings.  Symptoms resolved.  Patient was treated for TIA with dual antiplatelet for 3 weeks and then Plavix alone.  Since that time patient is doing well.  Occasionally has some mild memory complaints but overall is doing well with no major changes in ADLs.   REVIEW OF SYSTEMS: Full 14 system review of systems performed and negative with exception of: As per HPI.  ALLERGIES: Allergies  Allergen Reactions   Senokot Wheat Bran [Wheat Bran]     ABDOMINAL CRAMPS   Spironolactone     Stomach problems, vision changes     HOME MEDICATIONS: Outpatient Medications Prior to Visit  Medication Sig Dispense Refill   Accu-Chek FastClix Lancets MISC USE TO CHECK BLOOD SUGAR TWICE DAILY 102 each 2   acetaminophen (TYLENOL) 500 MG tablet Take 500-1,000 mg by mouth every 6 (six) hours as needed (pain).     ALPHAGAN P 0.1 % SOLN Place 1 drop into both eyes 2 (two) times daily.     amLODipine (NORVASC) 10 MG tablet TAKE 1 TABLET(10 MG) BY MOUTH EVERY MORNING (Patient taking differently: Take 10 mg by mouth daily.) 90 tablet 3   Ascorbic Acid (VITAMIN C) 1000 MG tablet Take 1,000 mg by mouth 4 (four) times a week. AT NIGHT     benazepril (LOTENSIN) 40 MG tablet TAKE 1 TABLET(40 MG) BY MOUTH DAILY (Patient taking differently: Take 40  mg by mouth daily.) 90 tablet 1   Blood Glucose Monitoring Suppl (ACCU-CHEK GUIDE) w/Device KIT 1 each by Does not apply route 4 (four) times daily. 1 kit 0   cholecalciferol (VITAMIN D3) 25 MCG (1000 UT) tablet Take 1,000 Units by mouth in the morning.     cloNIDine (CATAPRES) 0.1 MG tablet Take 1 tablet (0.1 mg total) by mouth daily. 30 tablet 2   clopidogrel (PLAVIX) 75 MG tablet Take 1 tablet (75 mg total) by mouth daily. 30 tablet 2   ezetimibe (ZETIA) 10 MG tablet Take 1 tablet (10 mg total) by mouth daily. 90 tablet 3   glipiZIDE (GLUCOTROL XL) 5 MG 24 hr tablet TAKE 1 TABLET(5 MG) BY MOUTH DAILY WITH BREAKFAST 90 tablet 0   glucose blood (ACCU-CHEK GUIDE) test strip USE TO CHECK BLOOD GLUCOSE TWICE DAILY 150 strip 1   insulin isophane & regular human KwikPen (HUMULIN 70/30 KWIKPEN) (70-30) 100 UNIT/ML KwikPen INJECT 60 UNITS UNDER THE SKIN IN THE MORNING AND 40 UNITS AT NIGHT 90 mL 0   Insulin Pen Needle (B-D ULTRAFINE III SHORT PEN) 31G X 8 MM MISC USE WITH INSULIN TWICE DAILY AS DIRECTED 200 each 2   latanoprost (XALATAN) 0.005 % ophthalmic solution Place 1 drop into both eyes at bedtime.      linaclotide (LINZESS) 290 MCG CAPS capsule Take 1 capsule (290 mcg total) by mouth daily before breakfast. 90 capsule 3   metFORMIN (  GLUCOPHAGE) 500 MG tablet Take 1 tablet (500 mg total) by mouth daily with breakfast. 90 tablet 1   Multiple Vitamin (MULTIVITAMIN WITH MINERALS) TABS tablet Take 1 tablet by mouth daily.     Omega-3 Fatty Acids (FISH OIL) 1200 MG CAPS Take 1,200 mg by mouth every morning.     pantoprazole (PROTONIX) 40 MG tablet TAKE 1 TABLET(40 MG) BY MOUTH DAILY 30 MINUTES BEFORE A MEAL 90 tablet 1   polyethylene glycol (MIRALAX) 17 g packet Take 17 g by mouth at bedtime. 14 each 0   pravastatin (PRAVACHOL) 80 MG tablet TAKE 1 TABLET(80 MG) BY MOUTH DAILY 90 tablet 3   No facility-administered medications prior to visit.    PAST MEDICAL HISTORY: Past Medical History:   Diagnosis Date   Anginal pain ([place])    Arthritis    OA   Diabetes mellitus    Dyspnea    Female bladder prolapse    GERD (gastroesophageal reflux disease)    Glaucoma    Hyperlipidemia    Hypertension    echo and stress 4/10 reports on chart, EKG ` LOV 9/12 on chart   S/P TAVR (transcatheter aortic valve replacement) 03/23/2021   Edwards 59m S3U TF approach with Dr. MAngelena Formand Dr. BCyndia Bent  Severe aortic stenosis    Thyroid disease     PAST SURGICAL HISTORY: Past Surgical History:  Procedure Laterality Date   ABDOMINAL HYSTERECTOMY     ANTERIOR AND POSTERIOR REPAIR  04/26/2011   Procedure: ANTERIOR (CYSTOCELE) AND POSTERIOR REPAIR (RECTOCELE);  Surgeon: SReece Packer MD;  Location: [facility];  Service: Urology;  Laterality: N/A;   BIOPSY  05/25/2020   Procedure: BIOPSY;  Surgeon: DDoran Stabler MD;  Location: [facility];  Service: Gastroenterology;;   CATARACT EXTRACTION Bilateral    with IOL   CHOLECYSTECTOMY  2009   COLONOSCOPY N/A 12/02/2013   three colon polyps removed, small internal hemorrhoids. Hyperplastic polyps   COLONOSCOPY N/A 11/20/2019   pancolonic diverticulosis, two 10-11 mm polyps in ascending colon, one 5 mm polyp in cecum, ascending colon with superficially invasive adenocarcinoma arising in background of sessile serrated polyps with low and high grade cytologic dysplasia.   COLONOSCOPY     COLONOSCOPY W/ POLYPECTOMY     COLONOSCOPY WITH PROPOFOL N/A 05/25/2020   diverticulosis in right colon, redundant colon. Caution warranted on future colonoscopy in light of age, cardiac condition, challenging anatomy.    DILATION AND CURETTAGE OF UTERUS     pt states this was in the 1970's   ESOPHAGOGASTRODUODENOSCOPY (EGD) WITH PROPOFOL N/A 05/25/2020   Grade 1 esophageal varices, single mucosal nodule in stomach s/p biopsy. (hyperplastic)>    LEFT HEART CATH  09/10/2008   normal coronary arteries, normal LV systolic function, EF 696%(Dr. HNorlene Duel    [place] 1997   under arm   NM MYOCAR PERF WALL MOTION  2010   dipyridamole - mild-mod in intenstiy perfusion defect in mid anterior, mid anteroseptal wall, EF 70%   OVARY SURGERY     bilateral tumors removed   POLYPECTOMY  11/20/2019   Procedure: POLYPECTOMY;  Surgeon: RDaneil Dolin MD;  [facility];  Service: Endoscopy;;  hot and cold snare cecal polyp, and asending polyps x 2   RIGHT/LEFT HEART CATH AND CORONARY ANGIOGRAPHY N/A 10/02/2020   Procedure: RIGHT/LEFT HEART CATH AND CORONARY ANGIOGRAPHY;  Surgeon: JMartinique Peter M, MD;  [facility];  Service: Cardiovascular;  Laterality: N/A;  THYROIDECTOMY     THYROIDECTOMY  02/2008   TRANSCATHETER AORTIC VALVE REPLACEMENT, TRANSFEMORAL N/A 03/23/2021   Procedure: TRANSCATHETER AORTIC VALVE REPLACEMENT, TRANSFEMORAL;  Surgeon: Burnell Blanks, MD;  Location: [facility] [place] [facility];  Service: Open Heart Surgery;  Laterality: N/A;   TRANSTHORACIC ECHOCARDIOGRAM  08/2011   EF=>55%, mild conc LVH; trace MR; mild TR; mild-mod AV calcification with mild valvular AV stenosis   VAGINAL PROLAPSE REPAIR  04/26/2011   Procedure: VAGINAL VAULT SUSPENSION;  Surgeon: Reece Packer, MD;  Location: [facility];  Service: Urology;  Laterality: N/A;  with Graft  10x6    FAMILY HISTORY: Family History  Problem Relation Age of Onset   Stomach cancer Mother 91   Heart disease Mother 19       heart disease   Heart disease Father 48       MI   Stroke Maternal Grandfather    Heart attack Paternal Grandfather    Hypertension Brother    Bone cancer Brother    Hypertension Sister    Liver cancer Sister    Hypertension Sister    Hypertension Child    Colon cancer Neg Hx    Colon polyps Neg Hx    Pancreatic cancer Neg Hx    Esophageal cancer Neg Hx    Rectal cancer Neg Hx     SOCIAL HISTORY: Social History   Socioeconomic History   Marital status: Married    Spouse name: [name]   Number of  children: 1   Years of education: Trade   Highest education level: 12th grade  Occupational History   Occupation: Retired   Occupation: Writer  Tobacco Use   Smoking status: Never   Smokeless tobacco: Never  Scientific laboratory technician Use: Never used  Substance and Sexual Activity   Alcohol use: Not Currently   Drug use: No   Sexual activity: Yes  Other Topics Concern   Not on file  Social History Narrative   Patient lives at home with spouse. 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 LIVED IN [place] FOR 50 YRS AND CAME BACK TO [place] ~2009. Caffeine Use: 1 cup of coffee daily. HAD ONE CHILD: PASSED 5 YRS. HAVE THREE GRAND-KIDS AND TWO GREAT GRANDS.    Social Determinants of Health   Financial Resource Strain: Low Risk  (08/02/2021)   Overall Financial Resource Strain (CARDIA)    Difficulty of Paying Living Expenses: Not very hard  Food Insecurity: No Food Insecurity (08/02/2021)   Hunger Vital Sign    Worried About Running Out of Food in the Last Year: Never true    Ran Out of Food in the Last Year: Never true  Transportation Needs: No Transportation Needs (08/02/2021)   PRAPARE - Hydrologist (Medical): No    Lack of Transportation (Non-Medical): No  Physical Activity: Insufficiently Active (08/02/2021)   Exercise Vital Sign    Days of Exercise per Week: 7 days    Minutes of Exercise per Session: 10 min  Stress: No Stress Concern Present (08/02/2021)   [place]    Feeling of Stress : Not at all  Social Connections: [place] (08/02/2021)   Social Connection and Isolation Panel [NHANES]    Frequency of Communication with Friends and Family: More than three times a week    Frequency of Social Gatherings with Friends and Family: Once a week    Attends Religious Services:  More than 4 times per year    Active Member of Clubs or Organizations: Yes     Attends Club or Organization Meetings: 1 to 4 times per year    Marital Status: Married  Human resources officer Violence: Not At Risk (08/02/2021)   Humiliation, Afraid, Rape, and Kick questionnaire    Fear of Current or Ex-Partner: No    Emotionally Abused: No    Physically Abused: No    Sexually Abused: No     PHYSICAL EXAM  GENERAL EXAM/CONSTITUTIONAL: Vitals:  Vitals:   03/02/22 1356  BP: (!) 162/81  Pulse: 70  Weight: 186 lb (84.4 kg)  Height: '5\' 5"'  (1.651 m)   Body mass index is 30.95 kg/m. Wt Readings from Last 3 Encounters:  03/02/22 186 lb (84.4 kg)  01/28/22 185 lb 1.3 oz (84 kg)  12/28/21 187 lb 6.3 oz (85 kg)   Patient is in no distress; well developed, nourished and groomed; neck is supple  CARDIOVASCULAR: Examination of carotid arteries is normal; no carotid bruits Regular rate and rhythm, no murmurs Examination of peripheral vascular system by observation and palpation is normal  EYES: Ophthalmoscopic exam of optic discs and posterior segments is normal; no papilledema or hemorrhages No results found.  MUSCULOSKELETAL: Gait, strength, tone, movements noted in Neurologic exam below  NEUROLOGIC: MENTAL STATUS:     07/23/2014    9:48 AM  MMSE - Mini Mental State Exam  Orientation to time 5  Orientation to Place 5  Registration 3  Attention/ Calculation 4  Recall 1  Language- name 2 objects 2  Language- repeat 1  Language- follow 3 step command 3  Language- read & follow direction 1  Write a sentence 1  Copy design 1  Total score 27   awake, alert, oriented to person, place and time recent and remote memory intact normal attention and concentration language fluent, comprehension intact, naming intact fund of knowledge appropriate  CRANIAL NERVE:  2nd - no papilledema on fundoscopic exam 2nd, 3rd, 4th, 6th - pupils equal and reactive to light, visual fields full to confrontation, extraocular muscles intact, no nystagmus 5th - facial sensation  symmetric 7th - facial strength symmetric 8th - hearing intact 9th - palate elevates symmetrically, uvula midline 11th - shoulder shrug symmetric 12th - tongue protrusion midline  MOTOR:  normal bulk and tone, full strength in the BUE, BLE  SENSORY:  normal and symmetric to light touch, temperature, vibration  COORDINATION:  finger-nose-finger, fine finger movements normal  REFLEXES:  deep tendon reflexes TRACE and symmetric  GAIT/STATION:  narrow based gait     DIAGNOSTIC DATA (LABS, IMAGING, TESTING) - I reviewed patient records, labs, notes, testing and imaging myself where available.  Lab Results  Component Value Date   WBC 6.6 12/28/2021   HGB 11.3 (L) 12/28/2021   HCT 33.3 (L) 12/28/2021   MCV 85.2 12/28/2021   PLT 122 (L) 12/28/2021      Component Value Date/Time   NA 135 12/28/2021 1624   NA 139 12/14/2021 0812   K 3.4 (L) 12/28/2021 1624   CL 100 12/28/2021 1624   CO2 27 12/28/2021 1624   GLUCOSE 223 (H) 12/28/2021 1624   BUN 24 (H) 12/28/2021 1624   BUN 13 12/14/2021 0812   CREATININE 1.27 (H) 12/28/2021 1624   CREATININE 1.11 (H) 03/02/2020 0848   CALCIUM 9.1 12/28/2021 1624   PROT 6.5 12/28/2021 1624   PROT 7.1 12/14/2021 0812   ALBUMIN 3.4 (L) 12/28/2021 1624   ALBUMIN  4.2 12/14/2021 0812   AST 21 12/28/2021 1624   ALT 19 12/28/2021 1624   ALKPHOS 44 12/28/2021 1624   BILITOT 0.3 12/28/2021 1624   BILITOT 0.3 12/14/2021 0812   GFRNONAA 43 (L) 12/28/2021 1624   GFRNONAA 57 (L) 07/30/2019 0718   GFRAA 66 07/30/2019 0718   Lab Results  Component Value Date   CHOL 142 12/29/2021   HDL 34 (L) 12/29/2021   LDLCALC 59 12/29/2021   TRIG 243 (H) 12/29/2021   CHOLHDL 4.2 12/29/2021   Lab Results  Component Value Date   HGBA1C 7.5 (A) 12/02/2021   No results found for: "VITAMINB12" Lab Results  Component Value Date   TSH 2.210 12/14/2021    Aug 2023 HOSPITAL WORKUP -CT head shows no acute intracranial abnormalities; There is mention  of 42m penumbra in Left temporal, but likely to be artifact -CTA shows no large vessel occlusion -UKoreacarotids done 12/21/21 and shows minimal heterogenous plaque without hemodynamically significant stenosis -MRI/MRA negative for any acute findings  12/29/21 TTE 1. Left ventricular ejection fraction, by estimation, is 60 to 65%. The  left ventricle has normal function. The left ventricle has no regional  wall motion abnormalities. There is mild left ventricular hypertrophy.  Left ventricular diastolic parameters  are indeterminate.   2. Right ventricular systolic function is normal. The right ventricular  size is normal. There is normal pulmonary artery systolic pressure. The  estimated right ventricular systolic pressure is 242.8mmHg.   3. Left atrial size was mild to moderately dilated.   4. The mitral valve is abnormal. Mild mitral valve regurgitation. Mild  mitral stenosis. The mean mitral valve gradient is 3.0 mmHg. Severe mitral  annular calcification.   5. The aortic valve has been repaired/replaced. Aortic valve  regurgitation is not visualized. There is a 23 mm Sapien prosthetic (TAVR)  valve present in the aortic position. Aortic valve mean gradient measures  15.0 mmHg.   6. The inferior vena cava is normal in size with greater than 50%  respiratory variability, suggesting right atrial pressure of 3 mmHg.   Comparison(s): Prior images reviewed side by side. TAVR functioning  normal, mean gradient 15 mmHg up from 12 mmHg. No paravalvular  regurgitation. Severe mitral annular calcification with mild mitral  regurgitation and stenosis.     ASSESSMENT AND PLAN  81y.o. year old female here with:   Dx:  1. TIA (transient ischemic attack)   2. Type 2 diabetes mellitus with stage 1 chronic kidney disease, with long-term current use of insulin (HA[place]   3. Mixed hyperlipidemia      PLAN:  TRANSIENT SLURRED SPEECH, ATAXIA, DIZZINESS (possible TIA) - continue plavix 751m daily, DM control, BP control, statin  MEMORY LOSS  - likely MCI vs normal aging; no major changes in ADLs - safety / supervision issues reviewed - daily physical activity / exercise (at least 15-30 minutes) - eat more plants / vegetables - increase social activities, brain stimulation, games, puzzles, hobbies, crafts, arts, music - aim for at least 7-8 hours sleep per night (or more) - avoid smoking and alcohol - caution with medications, finances, driving  Return for return to PCP.    VIPenni BombardMD [id number] Certified in Neurology, Neurophysiology and Neuroimaging  [facility] [address] StreetSuCraigrGlobeNC [phone]

## 2022-03-03 NOTE — Progress Notes (Addendum)
HEART AND [place]                                     Cardiology Office Note:    Date:  03/04/2022   ID:  Joanna Brown, DOB 07/19/1940, MRN [id number]  PCP:  Fayrene Helper, MD  [facility] Cardiologist:  [name], MD / Dr. Angelena Form, MD & Dr. Cyndia Bent, MD (TAVR) [facility] Electrophysiologist:  None   Referring MD: Fayrene Helper, MD   1 year s/p TAVR  History of Present Illness:    Joanna Brown is a 81 y.o. female with a hx of DM2, GERD, mild CAD, HLD, HTN, TIA (12/2021), and aortic stenosis s/p TAVR (03/23/21) who presents to clinic for follow up.   She was referred to Dr. Angelena Form 01/20/21 for the evaluation of aortic stenosis and possible TAVR. Cardiac catheterization 09/2020 showed mild CAD, normal cardiac output, normal right and left heart pressures. Mean gradient across the aortic valve 26.6 mmHg by cath. Echo 01/20/21 showed EF at 55-60%, moderate to severe aortic stenosis with mean gradient 25 mmHg, peak gradient 43.6 mmHg, AVA 0.83 cm2, dimensionless index 0.26. SVI 30. At the time of evaluation, she was having fatigue and DOE on exertion.  She was evaluated by the multidisciplinary valve team and underwent successful TAVR with a 23 mm Edwards Sapien 3 THV via the TF approach on 03/23/21. Post operative echo showed EF 60%, normally functioning TAVR with a mean gradient of 17 mmHg and no PVL as well as severe MAC with mild MS. She was discharged on aspirn monotherapy. 1 month echo showed EF 55%, normally functioning TAVR with a mean gradient of 12 mmHg and no PVL.  Admitted in 12/2021 for dizziness and diagnosed with a TIA. Started DAPT x 3 weeks followed plavix monotherapy.   Today the patient presents to clinic for follow up. Here alone. Has has issues with memory since TIA in August. No CP or SOB. Mild intermittent LE edema, but no orthopnea or PND. No dizziness or syncope. No blood in stool or urine. No  palpitations.    Past Medical History:  Diagnosis Date   Anginal pain ([place])    Arthritis    OA   Diabetes mellitus    Dyspnea    Female bladder prolapse    GERD (gastroesophageal reflux disease)    Glaucoma    Hyperlipidemia    Hypertension    echo and stress 4/10 reports on chart, EKG ` LOV 9/12 on chart   S/P TAVR (transcatheter aortic valve replacement) 03/23/2021   Edwards 42m S3U TF approach with Dr. MAngelena Formand Dr. BCyndia Bent  Severe aortic stenosis    Thyroid disease     Past Surgical History:  Procedure Laterality Date   ABDOMINAL HYSTERECTOMY     ANTERIOR AND POSTERIOR REPAIR  04/26/2011   Procedure: ANTERIOR (CYSTOCELE) AND POSTERIOR REPAIR (RECTOCELE);  Surgeon: SReece Packer MD;  Location: [facility];  Service: Urology;  Laterality: N/A;   BIOPSY  05/25/2020   Procedure: BIOPSY;  Surgeon: DDoran Stabler MD;  Location: [facility];  Service: Gastroenterology;;   CATARACT EXTRACTION Bilateral    with IOL   CHOLECYSTECTOMY  2009   COLONOSCOPY N/A 12/02/2013   three colon polyps removed, small internal hemorrhoids. Hyperplastic polyps   COLONOSCOPY N/A 11/20/2019   pancolonic diverticulosis, two 10-11 mm  polyps in ascending colon, one 5 mm polyp in cecum, ascending colon with superficially invasive adenocarcinoma arising in background of sessile serrated polyps with low and high grade cytologic dysplasia.   COLONOSCOPY     COLONOSCOPY W/ POLYPECTOMY     COLONOSCOPY WITH PROPOFOL N/A 05/25/2020   diverticulosis in right colon, redundant colon. Caution warranted on future colonoscopy in light of age, cardiac condition, challenging anatomy.    DILATION AND CURETTAGE OF UTERUS     pt states this was in the 1970's   ESOPHAGOGASTRODUODENOSCOPY (EGD) WITH PROPOFOL N/A 05/25/2020   Grade 1 esophageal varices, single mucosal nodule in stomach s/p biopsy. (hyperplastic)>    LEFT HEART CATH  09/10/2008   normal coronary arteries, normal LV systolic function, EF 71%  (Dr. [name])   [place] Right 1997   under arm   NM MYOCAR PERF WALL MOTION  2010   dipyridamole - mild-mod in intenstiy perfusion defect in mid anterior, mid anteroseptal wall, EF 70%   OVARY SURGERY     bilateral tumors removed   POLYPECTOMY  11/20/2019   Procedure: POLYPECTOMY;  Surgeon: Daneil Dolin, MD;  [facility];  Service: Endoscopy;;  hot and cold snare cecal polyp, and asending polyps x 2   RIGHT/LEFT HEART CATH AND CORONARY ANGIOGRAPHY N/A 10/02/2020   Procedure: RIGHT/LEFT HEART CATH AND CORONARY ANGIOGRAPHY;  Surgeon: Martinique, Peter M, MD;  Location: [place] [facility];  Service: Cardiovascular;  Laterality: N/A;   THYROIDECTOMY     THYROIDECTOMY  02/2008   TRANSCATHETER AORTIC VALVE REPLACEMENT, TRANSFEMORAL N/A 03/23/2021   Procedure: TRANSCATHETER AORTIC VALVE REPLACEMENT, TRANSFEMORAL;  Surgeon: Burnell Blanks, MD;  Location: [place] [facility];  Service: Open Heart Surgery;  Laterality: N/A;   TRANSTHORACIC ECHOCARDIOGRAM  08/2011   EF=>55%, mild conc LVH; trace MR; mild TR; mild-mod AV calcification with mild valvular AV stenosis   VAGINAL PROLAPSE REPAIR  04/26/2011   Procedure: VAGINAL VAULT SUSPENSION;  Surgeon: Reece Packer, MD;  Location: [facility];  Service: Urology;  Laterality: N/A;  with Graft  10x6    Current Medications: Current Meds  Medication Sig   Accu-Chek FastClix Lancets MISC USE TO CHECK BLOOD SUGAR TWICE DAILY   acetaminophen (TYLENOL) 500 MG tablet Take 500-1,000 mg by mouth every 6 (six) hours as needed (pain).   ALPHAGAN P 0.1 % SOLN Place 1 drop into both eyes 2 (two) times daily.   amLODipine (NORVASC) 10 MG tablet TAKE 1 TABLET(10 MG) BY MOUTH EVERY MORNING (Patient taking differently: Take 10 mg by mouth daily.)   Ascorbic Acid (VITAMIN C) 1000 MG tablet Take 1,000 mg by mouth 4 (four) times a week. AT NIGHT   benazepril (LOTENSIN) 40 MG tablet TAKE 1 TABLET(40 MG) BY MOUTH DAILY (Patient taking  differently: Take 40 mg by mouth daily.)   Blood Glucose Monitoring Suppl (ACCU-CHEK GUIDE) w/Device KIT 1 each by Does not apply route 4 (four) times daily.   cholecalciferol (VITAMIN D3) 25 MCG (1000 UT) tablet Take 1,000 Units by mouth in the morning.   cloNIDine (CATAPRES) 0.1 MG tablet Take 1 tablet (0.1 mg total) by mouth daily.   clopidogrel (PLAVIX) 75 MG tablet Take 1 tablet (75 mg total) by mouth daily.   ezetimibe (ZETIA) 10 MG tablet Take 1 tablet (10 mg total) by mouth daily.   glipiZIDE (GLUCOTROL XL) 5 MG 24 hr tablet TAKE 1 TABLET(5 MG) BY MOUTH DAILY WITH BREAKFAST   glucose blood (ACCU-CHEK GUIDE) test  strip USE TO CHECK BLOOD GLUCOSE TWICE DAILY   insulin isophane & regular human KwikPen (HUMULIN 70/30 KWIKPEN) (70-30) 100 UNIT/ML KwikPen INJECT 60 UNITS UNDER THE SKIN IN THE MORNING AND 40 UNITS AT NIGHT   Insulin Pen Needle (B-D ULTRAFINE III SHORT PEN) 31G X 8 MM MISC USE WITH INSULIN TWICE DAILY AS DIRECTED   latanoprost (XALATAN) 0.005 % ophthalmic solution Place 1 drop into both eyes at bedtime.    linaclotide (LINZESS) 290 MCG CAPS capsule Take 1 capsule (290 mcg total) by mouth daily before breakfast.   metFORMIN (GLUCOPHAGE) 500 MG tablet Take 1 tablet (500 mg total) by mouth daily with breakfast.   Multiple Vitamin (MULTIVITAMIN WITH MINERALS) TABS tablet Take 1 tablet by mouth daily.   Omega-3 Fatty Acids (FISH OIL) 1200 MG CAPS Take 1,200 mg by mouth every morning.   pantoprazole (PROTONIX) 40 MG tablet TAKE 1 TABLET(40 MG) BY MOUTH DAILY 30 MINUTES BEFORE A MEAL   polyethylene glycol (MIRALAX) 17 g packet Take 17 g by mouth at bedtime.   pravastatin (PRAVACHOL) 80 MG tablet TAKE 1 TABLET(80 MG) BY MOUTH DAILY     Allergies:   Senokot wheat bran [wheat bran] and Spironolactone   Social History   Socioeconomic History   Marital status: Married    Spouse name: [name]   Number of children: 1   Years of education: Trade   Highest education level: 12th grade   Occupational History   Occupation: Retired   Occupation: Writer  Tobacco Use   Smoking status: Never   Smokeless tobacco: Never  Scientific laboratory technician Use: Never used  Substance and Sexual Activity   Alcohol use: Not Currently   Drug use: No   Sexual activity: Yes  Other Topics Concern   Not on file  Social History Narrative   Patient lives at home with spouse. 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 LIVED IN [place] FOR 50 YRS AND CAME BACK TO [place] ~2009. Caffeine Use: 1 cup of coffee daily. HAD ONE CHILD: PASSED 5 YRS. HAVE THREE GRAND-KIDS AND TWO GREAT GRANDS.    Social Determinants of Health   Financial Resource Strain: Low Risk  (08/02/2021)   Overall Financial Resource Strain (CARDIA)    Difficulty of Paying Living Expenses: Not very hard  Food Insecurity: No Food Insecurity (08/02/2021)   Hunger Vital Sign    Worried About Running Out of Food in the Last Year: Never true    Ran Out of Food in the Last Year: Never true  Transportation Needs: No Transportation Needs (08/02/2021)   PRAPARE - Hydrologist (Medical): No    Lack of Transportation (Non-Medical): No  Physical Activity: Insufficiently Active (08/02/2021)   Exercise Vital Sign    Days of Exercise per Week: 7 days    Minutes of Exercise per Session: 10 min  Stress: No Stress Concern Present (08/02/2021)   [place]    Feeling of Stress : Not at all  Social Connections: [place] (08/02/2021)   Social Connection and Isolation Panel [NHANES]    Frequency of Communication with Friends and Family: More than three times a week    Frequency of Social Gatherings with Friends and Family: Once a week    Attends Religious Services: More than 4 times per year    Active Member of [employer] or Organizations: Yes    Attends Archivist Meetings: 1  to 4 times per year    Marital Status:  Married     Family History: The patient's family history includes Bone cancer in her brother; Heart attack in her paternal grandfather; Heart disease (age of onset: 49) in her father; Heart disease (age of onset: 68) in her mother; Hypertension in her brother, child, sister, and sister; Liver cancer in her sister; Stomach cancer (age of onset: 39) in her mother; Stroke in her maternal grandfather. There is no history of Colon cancer, Colon polyps, Pancreatic cancer, Esophageal cancer, or Rectal cancer.  ROS:   Please see the history of present illness.    All other systems reviewed and are negative.  EKGs/Labs/Other Studies Reviewed:    The following studies were reviewed today:  TAVR OPERATIVE NOTE     Date of Procedure:                03/23/2021   Preoperative Diagnosis:      Severe Aortic Stenosis    Postoperative Diagnosis:    Same    Procedure:        Transcatheter Aortic Valve Replacement - Percutaneous Right Transfemoral Approach             Edwards Sapien 3 Ultra RSL THV (size 23 mm, model # 9629BM:, serial # [id number])              Co-Surgeons:                        Gaye Pollack, MD and  Lauree Chandler, MD     Anesthesiologist:                  Maryclare Bean, DO   Echocardiographer:              Bertrum Sol, MD   Pre-operative Echo Findings: Severe aortic stenosis Normal left ventricular systolic function   Post-operative Echo Findings: No paravalvular leak Normal left ventricular systolic function _____________   Echo 03/24/21: IMPRESSIONS   1. Left ventricular ejection fraction, by estimation, is 60 to 65%. The  left ventricle has normal function. The left ventricle has no regional  wall motion abnormalities. There is mild concentric left ventricular  hypertrophy. Left ventricular diastolic  parameters are consistent with Grade I diastolic dysfunction (impaired  relaxation). Elevated left atrial pressure.   2. Right ventricular systolic function is  normal. The right ventricular  size is normal.   3. Left atrial size was mild to moderately dilated.   4. The mitral valve is normal in structure. Trivial mitral valve  regurgitation. Borderline mitral stenosis. The mean mitral valve gradient  is 4.5 mmHg. Moderate to severe mitral annular calcification.   5. The aortic valve has been repaired/replaced. Aortic valve  regurgitation is not visualized. No aortic stenosis is present. There is a  23 mm Edwards Sapien prosthetic (TAVR) valve present in the aortic  position. Procedure Date: 03/23/2021. Aortic valve   mean gradient measures 17.0 mmHg. Aortic valve Vmax measures 2.66 m/s.  Aortic valve acceleration time measures 67 msec.   6. The inferior vena cava is normal in size with greater than 50%  respiratory variability, suggesting right atrial pressure of 3 mmHg.   ________________________   Echocardiogram 04/26/21:   1. Left ventricular ejection fraction, by estimation, is 55 to 60%. The  left ventricle has normal function. The left ventricle has no regional  wall motion abnormalities. There is moderate concentric left ventricular  hypertrophy. Left ventricular  diastolic parameters are consistent with Grade I diastolic dysfunction  (impaired relaxation).   2. Right ventricular systolic function is normal. The right ventricular  size is normal. Tricuspid regurgitation signal is inadequate for assessing  PA pressure.   3. Left atrial size was mildly dilated.   4. The mitral valve is abnormal. Mild mitral valve regurgitation. Mild  mitral stenosis. Severe mitral annular calcification.   5. The aortic valve has been repaired/replaced. There is a 16m Edwards  Sapien TAVR valve present in the aortic position. Echo findings are  consistent with normal structure and function of the aortic valve  prosthesis. AoV mean gradient 127mg, Vmax  2.21m8m DI 0.35. There is no paravalvular leak.   6. The inferior vena cava is normal in size  with greater than 50%  respiratory variability, suggesting right atrial pressure of 3 mmHg.    ________________________  Echo 03/04/22 IMPRESSIONS   1. S/P TAVR with mean gradient 16 mmHg; mild AI; compared to 12/29/21 AI  is new.   2. Left ventricular ejection fraction, by estimation, is 60 to 65%. The  left ventricle has normal function. The left ventricle has no regional  wall motion abnormalities. Left ventricular diastolic parameters are  consistent with Grade I diastolic  dysfunction (impaired relaxation). Elevated left atrial pressure.   3. Right ventricular systolic function is normal. The right ventricular  size is normal.   4. Left atrial size was moderately dilated.   5. The mitral valve is normal in structure. Mild mitral valve  regurgitation. No evidence of mitral stenosis. Moderate mitral annular  calcification.   6. The aortic valve has been repaired/replaced. Aortic valve  regurgitation is mild. No aortic stenosis is present. There is a 23 mm  Sapien prosthetic (TAVR) valve present in the aortic position. Procedure  Date: 03/23/21.   7. The inferior vena cava is normal in size with greater than 50%  respiratory variability, suggesting right atrial pressure of 3 mmHg.  EKG:  EKG is NOT ordered today.    Recent Labs: 03/22/2021: B Natriuretic Peptide 16.2 12/14/2021: TSH 2.210 12/28/2021: ALT 19; BUN 24; Creatinine, Ser 1.27; Hemoglobin 11.3; Platelets 122; Potassium 3.4; Sodium 135  Recent Lipid Panel    Component Value Date/Time   CHOL 142 12/29/2021 0443   CHOL 159 12/14/2021 0812   TRIG 243 (H) 12/29/2021 0443   HDL 34 (L) 12/29/2021 0443   HDL 35 (L) 12/14/2021 0812   CHOLHDL 4.2 12/29/2021 0443   VLDL 49 (H) 12/29/2021 0443   LDLCALC 59 12/29/2021 0443   LDLCALC 90 12/14/2021 0812   LDLCALC 99 07/30/2019 0718     Risk Assessment/Calculations:       Physical Exam:    VS:  BP 136/62   Pulse (!) 56   Ht _0  (1.651 m)   Wt 184 lb (83.5 kg)   SpO2  99%   BMI 30.62 kg/m     Wt Readings from Last 3 Encounters:  03/04/22 184 lb (83.5 kg)  03/02/22 186 lb (84.4 kg)  01/28/22 185 lb 1.3 oz (84 kg)     GEN:  Well nourished, well developed in no acute distress HEENT: Normal NECK: No JVD LYMPHATICS: No lymphadenopathy CARDIAC: RRR, no murmurs, rubs, gallops RESPIRATORY:  Clear to auscultation without rales, wheezing or rhonchi  ABDOMEN: Soft, non-tender, non-distended MUSCULOSKELETAL:  No edema; No deformity  SKIN: Warm and dry.   NEUROLOGIC:  Alert and oriented x 3 PSYCHIATRIC:  Normal affect   ASSESSMENT:  1. S/P TAVR (transcatheter aortic valve replacement)   2. Essential hypertension   3. Gastroesophageal reflux disease without esophagitis   4. TIA (transient ischemic attack)     PLAN:    In order of problems listed above:  Severe AS s/p TAVR: echo today shows EF 60%, normally functioning TAVR with a mean gradient of 16 mm hg and new mild PVL and moderate MAC with mild MR. She has NYHA class II symptoms. Continue monotherpay with Plavix given recent TIA. Amoxicillin previously Rx;d for SBE prophylaxis. Continune regular follow up with Dr. [name].   HTN: BP mildly elevated but 136/62 on my personal recheck. No changes made.   GERD: continue PPI  TIA: has had issues with memory since then. Continue plavix and statin.    Medication Adjustments/Labs and Tests Ordered: Current medicines are reviewed at length with the patient today.  Concerns regarding medicines are outlined above.  No orders of the defined types were placed in this encounter.   No orders of the defined types were placed in this encounter.    Patient Instructions  Medication Instructions:  No changes *If you need a refill on your cardiac medications before your next appointment, please call your pharmacy*   Lab Work: none If you have labs (blood work) drawn today and your tests are completely normal, you will receive your results only  by: [place] (if you have MyChart) OR A paper copy in the mail If you have any lab test that is abnormal or we need to change your treatment, we will call you to review the results.   Testing/Procedures: none   Follow-Up: As planned with Dr. [name] (May 2024)   Important Information About Sugar         Signed, Angelena Form, PA-C  03/04/2022 2:00 PM    [place]

## 2022-03-04 ENCOUNTER — Ambulatory Visit (INDEPENDENT_AMBULATORY_CARE_PROVIDER_SITE_OTHER): Payer: Medicare Other | Admitting: Physician Assistant

## 2022-03-04 ENCOUNTER — Ambulatory Visit (HOSPITAL_COMMUNITY): Payer: Medicare Other | Attending: Cardiology

## 2022-03-04 VITALS — BP 136/62 | HR 56 | Ht 65.0 in | Wt 184.0 lb

## 2022-03-04 DIAGNOSIS — K219 Gastro-esophageal reflux disease without esophagitis: Secondary | ICD-10-CM | POA: Diagnosis not present

## 2022-03-04 DIAGNOSIS — Z952 Presence of prosthetic heart valve: Secondary | ICD-10-CM | POA: Insufficient documentation

## 2022-03-04 DIAGNOSIS — I639 Cerebral infarction, unspecified: Secondary | ICD-10-CM | POA: Diagnosis not present

## 2022-03-04 DIAGNOSIS — G459 Transient cerebral ischemic attack, unspecified: Secondary | ICD-10-CM

## 2022-03-04 DIAGNOSIS — I10 Essential (primary) hypertension: Secondary | ICD-10-CM | POA: Insufficient documentation

## 2022-03-04 LAB — ECHOCARDIOGRAM COMPLETE
AV Mean grad: 15.6 mmHg
AV Peak grad: 27 mmHg
Ao pk vel: 2.6 m/s
Area-P 1/2: 2.49 cm2
P 1/2 time: 578 msec
S' Lateral: 3.7 cm

## 2022-03-04 NOTE — Patient Instructions (Signed)
Medication Instructions:  No changes *If you need a refill on your cardiac medications before your next appointment, please call your pharmacy*   Lab Work: none If you have labs (blood work) drawn today and your tests are completely normal, you will receive your results only by: [place] (if you have MyChart) OR A paper copy in the mail If you have any lab test that is abnormal or we need to change your treatment, we will call you to review the results.   Testing/Procedures: none   Follow-Up: As planned with Dr. [name] (May 2024)   Important Information About Sugar

## 2022-03-23 ENCOUNTER — Encounter: Payer: Self-pay | Admitting: Podiatry

## 2022-03-23 ENCOUNTER — Ambulatory Visit (INDEPENDENT_AMBULATORY_CARE_PROVIDER_SITE_OTHER): Payer: Medicare Other | Admitting: Podiatry

## 2022-03-23 DIAGNOSIS — M79674 Pain in right toe(s): Secondary | ICD-10-CM | POA: Diagnosis not present

## 2022-03-23 DIAGNOSIS — M79675 Pain in left toe(s): Secondary | ICD-10-CM

## 2022-03-23 DIAGNOSIS — E1142 Type 2 diabetes mellitus with diabetic polyneuropathy: Secondary | ICD-10-CM | POA: Diagnosis not present

## 2022-03-23 DIAGNOSIS — B351 Tinea unguium: Secondary | ICD-10-CM | POA: Diagnosis not present

## 2022-03-23 NOTE — Progress Notes (Signed)
  Subjective:  Patient ID: Joanna Brown, female    DOB: 1940-07-16,  MRN: [id number]  Joanna Brown presents to clinic today for preventative diabetic foot care and painful thick toenails that are difficult to trim. Pain interferes with ambulation. Aggravating factors include wearing enclosed shoe gear. Pain is relieved with periodic professional debridement.  Chief Complaint  Patient presents with   Nail Problem    DFC BG - 127 , this morning A1C - 7.0  PCP - Dr Tula Nakayama    New problem(s): None.   PCP is Fayrene Helper, MD , and last visit was January 28, 2022.  Allergies  Allergen Reactions   Senokot Wheat Bran [Wheat Bran]     ABDOMINAL CRAMPS   Spironolactone     Stomach problems, vision changes    Review of Systems: Negative except as noted in the HPI.  Objective: No changes noted in today's physical examination.  Joanna Brown is a pleasant 81 y.o. female WD, WN in NAD. AAO x 3.  Vascular Examination: Vascular status intact b/l with palpable pedal pulses. CFT immediate b/l. No edema. No pain with calf compression b/l. Skin temperature gradient WNL b/l. No ischemia or gangrene noted b/l LE. No cyanosis or clubbing noted b/l LE.  Neurological Examination: Sensation grossly intact b/l with 10 gram monofilament. Vibratory sensation intact b/l. Protective sensation intact 5/5 intact bilaterally with 10g monofilament b/l.  Dermatological Examination: Pedal skin with normal turgor, texture and tone b/l. Toenails 1-5 b/l thick, discolored, elongated with subungual debris and pain on dorsal palpation. No hyperkeratotic lesions noted b/l.   Musculoskeletal Examination: Muscle strength 5/5 to b/l LE. HAV with bunion deformity noted b/l LE. Patient ambulates independent of any assistive aids.   Assessment/Plan: 1. Pain due to onychomycosis of toenails of both feet   2. Diabetic peripheral neuropathy associated with type 2 diabetes mellitus ([place])     No  orders of the defined types were placed in this encounter.   -Patient was evaluated and treated. All patient's and/or POA's questions/concerns answered on today's visit. -Continue diabetic foot care principles: inspect feet daily, monitor glucose as recommended by PCP and/or Endocrinologist, and follow prescribed diet per PCP, Endocrinologist and/or dietician. -Continue supportive shoe gear daily. -Toenails 1-5 b/l were debrided in length and girth with sterile nail nippers and dremel without iatrogenic bleeding.  -Patient/POA to call should there be question/concern in the interim.   No follow-ups on file.  Marzetta Board, DPM

## 2022-04-04 ENCOUNTER — Other Ambulatory Visit: Payer: Self-pay | Admitting: "Endocrinology

## 2022-04-04 ENCOUNTER — Encounter: Payer: Self-pay | Admitting: "Endocrinology

## 2022-04-04 ENCOUNTER — Other Ambulatory Visit: Payer: Self-pay | Admitting: Nurse Practitioner

## 2022-04-04 ENCOUNTER — Ambulatory Visit (INDEPENDENT_AMBULATORY_CARE_PROVIDER_SITE_OTHER): Payer: Medicare Other | Admitting: "Endocrinology

## 2022-04-04 ENCOUNTER — Other Ambulatory Visit: Payer: Self-pay | Admitting: Family Medicine

## 2022-04-04 VITALS — BP 148/56 | HR 80 | Ht 65.0 in | Wt 184.2 lb

## 2022-04-04 DIAGNOSIS — I10 Essential (primary) hypertension: Secondary | ICD-10-CM

## 2022-04-04 DIAGNOSIS — I639 Cerebral infarction, unspecified: Secondary | ICD-10-CM

## 2022-04-04 DIAGNOSIS — E1122 Type 2 diabetes mellitus with diabetic chronic kidney disease: Secondary | ICD-10-CM | POA: Diagnosis not present

## 2022-04-04 DIAGNOSIS — E782 Mixed hyperlipidemia: Secondary | ICD-10-CM

## 2022-04-04 DIAGNOSIS — E049 Nontoxic goiter, unspecified: Secondary | ICD-10-CM | POA: Diagnosis not present

## 2022-04-04 DIAGNOSIS — N181 Chronic kidney disease, stage 1: Secondary | ICD-10-CM | POA: Diagnosis not present

## 2022-04-04 DIAGNOSIS — Z794 Long term (current) use of insulin: Secondary | ICD-10-CM

## 2022-04-04 MED ORDER — HUMULIN 70/30 KWIKPEN (70-30) 100 UNIT/ML ~~LOC~~ SUPN: 40.0000 [IU] | PEN_INJECTOR | Freq: Two times a day (BID) | SUBCUTANEOUS | 2 refills | Status: DC

## 2022-04-04 MED ORDER — ACCU-CHEK GUIDE ME W/DEVICE KIT: 1.0000 | PACK | 0 refills | Status: DC

## 2022-04-04 MED ORDER — ACCU-CHEK GUIDE VI STRP: ORAL_STRIP | 2 refills | Status: DC

## 2022-04-04 NOTE — Progress Notes (Signed)
04/04/2022                     Endocrinology follow-up note   Subjective:    Patient ID: Joanna Brown, female    DOB: 1940/06/23. She is being seen in follow-up for uncontrolled type 2 diabetes, complicated by CKD, hyperlipidemia, hypertension.     PMD:   Fayrene Helper, MD  Past Medical History:  Diagnosis Date   Anginal pain ([place])    Arthritis    OA   Diabetes mellitus    Dyspnea    Female bladder prolapse    GERD (gastroesophageal reflux disease)    Glaucoma    Hyperlipidemia    Hypertension    echo and stress 4/10 reports on chart, EKG ` LOV 9/12 on chart   S/P TAVR (transcatheter aortic valve replacement) 03/23/2021   Edwards 2m S3U TF approach with Dr. MAngelena Formand Dr. BCyndia Bent  Severe aortic stenosis    Thyroid disease    Past Surgical History:  Procedure Laterality Date   ABDOMINAL HYSTERECTOMY     ANTERIOR AND POSTERIOR REPAIR  04/26/2011   Procedure: ANTERIOR (CYSTOCELE) AND POSTERIOR REPAIR (RECTOCELE);  Surgeon: SReece Packer MD;  Location: [facility];  Service: Urology;  Laterality: N/A;   BIOPSY  05/25/2020   Procedure: BIOPSY;  Surgeon: DDoran Stabler MD;  Location: [facility];  Service: Gastroenterology;;   CATARACT EXTRACTION Bilateral    with IOL   CHOLECYSTECTOMY  2009   COLONOSCOPY N/A 12/02/2013   three colon polyps removed, small internal hemorrhoids. Hyperplastic polyps   COLONOSCOPY N/A 11/20/2019   pancolonic diverticulosis, two 10-11 mm polyps in ascending colon, one 5 mm polyp in cecum, ascending colon with superficially invasive adenocarcinoma arising in background of sessile serrated polyps with low and high grade cytologic dysplasia.   COLONOSCOPY     COLONOSCOPY W/ POLYPECTOMY     COLONOSCOPY WITH PROPOFOL N/A 05/25/2020   diverticulosis in right colon, redundant colon. Caution warranted on future colonoscopy in light of age, cardiac condition, challenging anatomy.    DILATION AND CURETTAGE OF UTERUS     pt states  this was in the 1970's   ESOPHAGOGASTRODUODENOSCOPY (EGD) WITH PROPOFOL N/A 05/25/2020   Grade 1 esophageal varices, single mucosal nodule in stomach s/p biopsy. (hyperplastic)>    LEFT HEART CATH  09/10/2008   normal coronary arteries, normal LV systolic function, EF 644%(Dr. HNorlene Duel   [place] 1997   under arm   NM MYOCAR PERF WALL MOTION  2010   dipyridamole - mild-mod in intenstiy perfusion defect in mid anterior, mid anteroseptal wall, EF 70%   OVARY SURGERY     bilateral tumors removed   POLYPECTOMY  11/20/2019   Procedure: POLYPECTOMY;  Surgeon: RDaneil Dolin MD;  [facility];  Service: Endoscopy;;  hot and cold snare cecal polyp, and asending polyps x 2   RIGHT/LEFT HEART CATH AND CORONARY ANGIOGRAPHY N/A 10/02/2020   Procedure: RIGHT/LEFT HEART CATH AND CORONARY ANGIOGRAPHY;  Surgeon: JMartinique Peter M, MD;  [facility];  Service: Cardiovascular;  Laterality: N/A;   THYROIDECTOMY     THYROIDECTOMY  02/2008   TRANSCATHETER AORTIC VALVE REPLACEMENT, TRANSFEMORAL N/A 03/23/2021   Procedure: TRANSCATHETER AORTIC VALVE REPLACEMENT, TRANSFEMORAL;  Surgeon: MBurnell Blanks MD;  [facility];  Service: Open Heart Surgery;  Laterality: N/A;   TRANSTHORACIC ECHOCARDIOGRAM  08/2011   EF=>55%, mild conc LVH; trace MR; mild  TR; mild-mod AV calcification with mild valvular AV stenosis   VAGINAL PROLAPSE REPAIR  04/26/2011   Procedure: VAGINAL VAULT SUSPENSION;  Surgeon: Reece Packer, MD;  Location: [facility];  Service: Urology;  Laterality: N/A;  with Graft  10x6   Social History   Socioeconomic History   Marital status: Married    Spouse name: [name]   Number of children: 1   Years of education: Trade   Highest education level: 12th grade  Occupational History   Occupation: Retired   Occupation: Writer  Tobacco Use   Smoking status: Never   Smokeless tobacco: Never  Scientific laboratory technician  Use: Never used  Substance and Sexual Activity   Alcohol use: Not Currently   Drug use: No   Sexual activity: Yes  Other Topics Concern   Not on file  Social History Narrative   Patient lives at home with spouse. 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 LIVED IN [place] FOR 50 YRS AND CAME BACK TO [place] ~2009. Caffeine Use: 1 cup of coffee daily. HAD ONE CHILD: PASSED 5 YRS. HAVE THREE GRAND-KIDS AND TWO GREAT GRANDS.    Social Determinants of Health   Financial Resource Strain: Low Risk  (08/02/2021)   Overall Financial Resource Strain (CARDIA)    Difficulty of Paying Living Expenses: Not very hard  Food Insecurity: No Food Insecurity (08/02/2021)   Hunger Vital Sign    Worried About Running Out of Food in the Last Year: Never true    Ran Out of Food in the Last Year: Never true  Transportation Needs: No Transportation Needs (08/02/2021)   PRAPARE - Hydrologist (Medical): No    Lack of Transportation (Non-Medical): No  Physical Activity: Insufficiently Active (08/02/2021)   Exercise Vital Sign    Days of Exercise per Week: 7 days    Minutes of Exercise per Session: 10 min  Stress: No Stress Concern Present (08/02/2021)   [place]    Feeling of Stress : Not at all  Social Connections: [place] (08/02/2021)   Social Connection and Isolation Panel [NHANES]    Frequency of Communication with Friends and Family: More than three times a week    Frequency of Social Gatherings with Friends and Family: Once a week    Attends Religious Services: More than 4 times per year    Active Member of [employer] or Organizations: Yes    Attends Archivist Meetings: 1 to 4 times per year    Marital Status: Married   Outpatient Encounter Medications as of 04/04/2022  Medication Sig   Blood Glucose Monitoring Suppl (ACCU-CHEK GUIDE ME) w/Device KIT 1 Piece by Does not  apply route as directed.   glucose blood (ACCU-CHEK GUIDE) test strip Use as instructed   acetaminophen (TYLENOL) 500 MG tablet Take 500-1,000 mg by mouth every 6 (six) hours as needed (pain).   ALPHAGAN P 0.1 % SOLN Place 1 drop into both eyes 2 (two) times daily.   Ascorbic Acid (VITAMIN C) 1000 MG tablet Take 1,000 mg by mouth 4 (four) times a week. AT NIGHT   benazepril (LOTENSIN) 40 MG tablet TAKE 1 TABLET(40 MG) BY MOUTH DAILY (Patient taking differently: Take 40 mg by mouth daily.)   Blood Glucose Monitoring Suppl (ACCU-CHEK GUIDE) w/Device KIT 1 each by Does not apply route 4 (four) times daily.   cholecalciferol (VITAMIN D3) 25 MCG (1000 UT)  tablet Take 1,000 Units by mouth in the morning.   cloNIDine (CATAPRES) 0.1 MG tablet Take 1 tablet (0.1 mg total) by mouth daily.   ezetimibe (ZETIA) 10 MG tablet Take 1 tablet (10 mg total) by mouth daily.   insulin isophane & regular human KwikPen (HUMULIN 70/30 KWIKPEN) (70-30) 100 UNIT/ML KwikPen Inject 40-50 Units into the skin 2 (two) times daily before a meal.   Insulin Pen Needle (B-D ULTRAFINE III SHORT PEN) 31G X 8 MM MISC USE WITH INSULIN TWICE DAILY AS DIRECTED   latanoprost (XALATAN) 0.005 % ophthalmic solution Place 1 drop into both eyes at bedtime.    linaclotide (LINZESS) 290 MCG CAPS capsule Take 1 capsule (290 mcg total) by mouth daily before breakfast.   metFORMIN (GLUCOPHAGE) 500 MG tablet Take 1 tablet (500 mg total) by mouth daily with breakfast.   Multiple Vitamin (MULTIVITAMIN WITH MINERALS) TABS tablet Take 1 tablet by mouth daily.   Omega-3 Fatty Acids (FISH OIL) 1200 MG CAPS Take 1,200 mg by mouth every morning.   pantoprazole (PROTONIX) 40 MG tablet TAKE 1 TABLET(40 MG) BY MOUTH DAILY 30 MINUTES BEFORE A MEAL   polyethylene glycol (MIRALAX) 17 g packet Take 17 g by mouth at bedtime.   pravastatin (PRAVACHOL) 80 MG tablet TAKE 1 TABLET(80 MG) BY MOUTH DAILY   [DISCONTINUED] Accu-Chek FastClix Lancets MISC USE TO CHECK  BLOOD SUGAR TWICE DAILY   [DISCONTINUED] amLODipine (NORVASC) 10 MG tablet TAKE 1 TABLET(10 MG) BY MOUTH EVERY MORNING (Patient taking differently: Take 10 mg by mouth daily.)   [DISCONTINUED] glipiZIDE (GLUCOTROL XL) 5 MG 24 hr tablet TAKE 1 TABLET(5 MG) BY MOUTH DAILY WITH BREAKFAST   [DISCONTINUED] glucose blood (ACCU-CHEK GUIDE) test strip USE TO CHECK BLOOD GLUCOSE TWICE DAILY   [DISCONTINUED] insulin isophane & regular human KwikPen (HUMULIN 70/30 KWIKPEN) (70-30) 100 UNIT/ML KwikPen INJECT 60 UNITS UNDER THE SKIN IN THE MORNING AND 40 UNITS AT NIGHT (Patient taking differently: 40 Units 2 (two) times daily. INJECT 40 UNITS UNDER THE SKIN IN THE MORNING AND 40 UNITS AT NIGHT)   No facility-administered encounter medications on file as of 04/04/2022.   ALLERGIES: Allergies  Allergen Reactions   Senokot Wheat Bran [Wheat Bran]     ABDOMINAL CRAMPS   Spironolactone     Stomach problems, vision changes    VACCINATION STATUS: Immunization History  Administered Date(s) Administered   Hepatitis B, adult 09/27/2021   Moderna SARS-COV2 Booster Vaccination 11/19/2020   Moderna Sars-Covid-2 Vaccination 09/13/2019, 10/12/2019, 04/29/2020   Pneumococcal Conjugate-13 12/11/2013   Pneumococcal Polysaccharide-23 01/13/2010   Tdap 10/05/2010    Diabetes She presents for her follow-up diabetic visit. She has type 2 diabetes mellitus. Onset time: She was diagnosed at approximate age of 89 years. Her disease course has been improving. There are no hypoglycemic associated symptoms. Pertinent negatives for hypoglycemia include no confusion, headaches, pallor or seizures. Pertinent negatives for diabetes include no blurred vision, no chest pain, no fatigue, no polydipsia, no polyphagia and no polyuria. There are no hypoglycemic complications. Symptoms are improving. Diabetic complications include nephropathy and retinopathy. Risk factors for coronary artery disease include dyslipidemia, diabetes mellitus,  obesity and sedentary lifestyle. Her weight is fluctuating minimally. She is following a generally unhealthy diet. When asked about meal planning, she reported none. She has had a previous visit with a dietitian. She rarely participates in exercise. Her home blood glucose trend is fluctuating minimally. Her breakfast blood glucose range is generally 130-140 mg/dl. Her dinner blood glucose range is generally  180-200 mg/dl. Her overall blood glucose range is 180-200 mg/dl. (Ms. Milo presents with progressive improvement in her glycemic profile.  Her most recent A1c was 7.1%, generally improving.  She did not document hypoglycemia.  However she has postprandial hyperglycemia.   ) Eye exam is current.  Hyperlipidemia This is a chronic problem. The current episode started more than 1 year ago. The problem is uncontrolled. Exacerbating diseases include diabetes and obesity. Pertinent negatives include no chest pain, myalgias or shortness of breath. Current antihyperlipidemic treatment includes statins. Risk factors for coronary artery disease include dyslipidemia, diabetes mellitus, hypertension, obesity, a sedentary lifestyle and post-menopausal.  Hypertension This is a chronic problem. The current episode started more than 1 year ago. Pertinent negatives include no blurred vision, chest pain, headaches, palpitations or shortness of breath. Risk factors for coronary artery disease include dyslipidemia, diabetes mellitus, obesity and sedentary lifestyle. Hypertensive end-organ damage includes retinopathy.   Nodular goiter She Patient is being seen today for a new issue with her thyroid.  She was found to have 2.6 cm nodule on right lobe of her thyroid while undergoing CT scan of the chest during cancer surveillance.  She has previous history of left hemithyroidectomy approximately 10 years ago for multinodular goiter.  She is not on thyroid hormone supplement or replacement.  She denies dysphagia, shortness of  breath, nor voice change. -Her subsequent thyroid ultrasound confirms multinodular right lobe of the thyroid with surgically absent left lobe.  Her right thyroid lobe nodule did not meet criteria for biopsy.  Review of systems  Constitutional: + Minimally fluctuating body weight, current  Body mass index is 30.65 kg/m. , no fatigue, no subjective hyperthermia, no subjective hypothermia    Objective:    BP (!) 148/56   Pulse 80   Ht _0  (1.651 m)   Wt 184 lb 3.2 oz (83.6 kg)   BMI 30.65 kg/m   Wt Readings from Last 3 Encounters:  04/04/22 184 lb 3.2 oz (83.6 kg)  03/04/22 184 lb (83.5 kg)  03/02/22 186 lb (84.4 kg)    Physical Exam- Limited  Constitutional:  Body mass index is 30.65 kg/m. , not in acute distress, normal state of mind    CMP ( most recent) CMP     Component Value Date/Time   NA 135 12/28/2021 1624   NA 139 12/14/2021 0812   K 3.4 (L) 12/28/2021 1624   CL 100 12/28/2021 1624   CO2 27 12/28/2021 1624   GLUCOSE 223 (H) 12/28/2021 1624   BUN 24 (H) 12/28/2021 1624   BUN 13 12/14/2021 0812   CREATININE 1.27 (H) 12/28/2021 1624   CREATININE 1.11 (H) 03/02/2020 0848   CALCIUM 9.1 12/28/2021 1624   PROT 6.5 12/28/2021 1624   PROT 7.1 12/14/2021 0812   ALBUMIN 3.4 (L) 12/28/2021 1624   ALBUMIN 4.2 12/14/2021 0812   AST 21 12/28/2021 1624   ALT 19 12/28/2021 1624   ALKPHOS 44 12/28/2021 1624   BILITOT 0.3 12/28/2021 1624   BILITOT 0.3 12/14/2021 0812   GFRNONAA 43 (L) 12/28/2021 1624   GFRNONAA 57 (L) 07/30/2019 0718   GFRAA 66 07/30/2019 0718    Diabetic Labs (most recent): Lab Results  Component Value Date   HGBA1C 7.5 (A) 12/02/2021   HGBA1C 7.1 (A) 09/01/2021   HGBA1C 7.4 (H) 03/22/2021   MICROALBUR 6.4 07/30/2019   MICROALBUR 11.5 06/28/2018   MICROALBUR 10.3 (H) 07/18/2017    Lipid Panel     Component Value Date/Time   CHOL  142 12/29/2021 0443   CHOL 159 12/14/2021 0812   TRIG 243 (H) 12/29/2021 0443   HDL 34 (L) 12/29/2021  0443   HDL 35 (L) 12/14/2021 0812   CHOLHDL 4.2 12/29/2021 0443   VLDL 49 (H) 12/29/2021 0443   LDLCALC 59 12/29/2021 0443   LDLCALC 90 12/14/2021 0812   LDLCALC 99 07/30/2019 0718   Incidental finding on chest CT on December 05, 2019 Enlarged  multinodular remnant right thyroid with dominant 2.6 cm hypodense Nodule.   Thyroid ultrasound on December 19, 2019: Right lobe 4.4 cm, left lobe absent surgically. No adenopathy   IMPRESSION: Surgical changes of left hemithyroidectomy.  Multinodular thyroid.  3.9 cm and 1.6 cm cystic/almost completely cystic nodules with smooth margins, No thyroid nodule meets criteria for biopsy or surveillance, as designated by the newly established ACR TI-RADS criteria.   Assessment & Plan:     1.  Nodular goiter: Patient with remote past history of left hemithyroidectomy for multinodular goiter.  She did not require thyroid hormone supplement.  She was incidentally found to have 2.6 cm nodule in the right lobe. -Her subsequent dedicated thyroid ultrasound confirmed 2 nodules on the right lobe of her thyroid which did not meet any criteria for biopsy. -She will not need any antithyroid intervention at this time.  Her TFTs are consistent with euthyroid presentation.  She will be considered for thyroid function test as well as repeat thyroid ultrasound in a year.     2. Type 2 diabetes mellitus with stage 1 chronic kidney disease, with long-term current use of insulin ([place]).  Ms. Pilant presents with progressive improvement in her glycemic profile.  Her most recent A1c was 7.1%, generally improving.  She did not document hypoglycemia.  However she has postprandial hyperglycemia.    Recent labs reviewed, showing improving renal function.     Her diabetes is complicated by CKD obesity/sedentary life and patient remains at a high risk for more acute and chronic complications of diabetes which include CAD, CVA, CKD, retinopathy, and neuropathy. These are all  discussed in detail with the patient.  - I have counseled the patient on diet management and weight loss, by adopting a carbohydrate restricted/protein rich diet.  -She still admits to dietary indiscretions including consumption of sweets and sweetened beverages.  - she acknowledges that there is a room for improvement in her food and drink choices. - Suggestion is made for her to avoid simple carbohydrates  from her diet including Cakes, Sweet Desserts, Ice Cream, Soda (diet and regular), Sweet Tea, Candies, Chips, Cookies, Store Bought Juices, Alcohol , Artificial Sweeteners,  Coffee Creamer, and "Sugar-free" Products, Lemonade. This will help patient to have more stable blood glucose profile and potentially avoid unintended weight gain.  The following Lifestyle Medicine recommendations according to [place]  [facility]) were discussed and and offered to patient and she  agrees to start the journey:  A. Whole Foods, Plant-Based Nutrition comprising of fruits and vegetables, plant-based proteins, whole-grain carbohydrates was discussed in detail with the patient.   A list for source of those nutrients were also provided to the patient.  Patient will use only water or unsweetened tea for hydration. B.  The need to stay away from risky substances including alcohol, smoking; obtaining 7 to 9 hours of restorative sleep, at least 150 minutes of moderate intensity exercise weekly, the importance of healthy social connections,  and stress management techniques were discussed. C.  A full color page of  Calorie density of various food groups per pound showing examples of each food groups was provided to the patient.   - I encouraged the patient to switch to  unprocessed or minimally processed complex starch and increased protein intake (animal or plant source), fruits, and vegetables.  - Patient is advised to stick to a routine mealtimes to eat 3 meals  a day and avoid unnecessary  snacks ( to snack only to correct hypoglycemia).   - I have approached patient with the following individualized plan to manage diabetes and patient agrees:    -She is advised to avoid insulin injection if her Premeal readings are below 74m/dL.    -Since she presents with postprandial hyperglycemia, she is advised to increase her Humulin 70/30 to 50 units with breakfast, and 40 units with supper when Premeal blood glucose readings are above 90 mg per DL.   -She is encouraged to start monitoring blood glucose 4 times a day-before meals and at bedtime .  -Patient is encouraged to call clinic for blood glucose levels less than 70 or above 200 mg /dl. -She is advised to  continue glipizide 5 mg XL p.o. daily at breakfast, and metformin 500 mg p.o. daily at breakfast.    - Patient specific target  A1c;  LDL, HDL, Triglycerides, were discussed in detail.  3) BP/HTN:  -Her blood pressure is controlled to near target limits.   She is advised to continue her current blood pressure medications including benazepril 40 mg p.o. daily at breakfast.    4) Lipids/HPL: Her recent lipid panel showed improved LDL to 59 from 97.  She is advised to continue pravastatin 80 mg p.o. nightly.  Whole food plant-based diet was discussed with her.       5)  Weight/Diet: Her BMI is   30.65--she is a candidate for moderate weight loss.  CDE Consult has been initiated , exercise, and detailed carbohydrates information provided.  6) Chronic Care/Health Maintenance:  -Patient is on ACEI/ARB and Statin medications and encouraged to continue to follow up with Ophthalmology, Podiatrist at least yearly or according to recommendations, and advised to  stay away from smoking. I have recommended yearly flu vaccine and pneumonia vaccination at least every 5 years; moderate intensity exercise for up to 150 minutes weekly; and  sleep for at least 7 hours a day.  POC ABI for PAD screen was normal on June 10, 2020.  This test  will be repeated in January 2027, or sooner if needed.     - I advised patient to maintain close follow up with SFayrene Helper MD for primary care needs.     I spent 32 minutes in the care of the patient today including review of labs from [facility] Lipids, Thyroid Function, Hematology (current and previous including abstractions from other facilities); face-to-face time discussing  her blood glucose readings/logs, discussing hypoglycemia and hyperglycemia episodes and symptoms, medications doses, her options of short and long term treatment based on the latest standards of care / guidelines;  discussion about incorporating lifestyle medicine;  and documenting the encounter. Risk reduction counseling performed per USPSTF guidelines to reduce  obesity and cardiovascular risk factors.     Please refer to Patient Instructions for Blood Glucose Monitoring and Insulin/Medications Dosing Guide"  in media tab for additional information. Please  also refer to " Patient Self Inventory" in the Media  tab for reviewed elements of pertinent patient history.  MDerinda LateRatliff participated in the discussions, expressed understanding, and voiced  agreement with the above plans.  All questions were answered to her satisfaction. she is encouraged to contact clinic should she have any questions or concerns prior to her return visit.    Follow up plan: - Return in about 4 months (around 08/03/2022) for F/U with Pre-visit Labs, Meter/CGM/Logs, A1c here.  Glade Lloyd, MD Phone: [phone]  Fax: [phone]   This note was partially dictated with voice recognition software. Similar sounding words can be transcribed inadequately or may not  be corrected upon review.  04/04/2022, 12:43 PM

## 2022-04-04 NOTE — Patient Instructions (Signed)

## 2022-04-05 ENCOUNTER — Other Ambulatory Visit: Payer: Self-pay

## 2022-04-05 DIAGNOSIS — H401131 Primary open-angle glaucoma, bilateral, mild stage: Secondary | ICD-10-CM | POA: Diagnosis not present

## 2022-04-05 MED ORDER — ACCU-CHEK GUIDE VI STRP: 1.0000 | ORAL_STRIP | Freq: Four times a day (QID) | 3 refills | Status: DC

## 2022-04-05 MED ORDER — ACCU-CHEK GUIDE W/DEVICE KIT: 1.0000 | PACK | Freq: Four times a day (QID) | 0 refills | Status: DC

## 2022-04-09 DIAGNOSIS — R768 Other specified abnormal immunological findings in serum: Secondary | ICD-10-CM | POA: Diagnosis not present

## 2022-04-09 DIAGNOSIS — I129 Hypertensive chronic kidney disease with stage 1 through stage 4 chronic kidney disease, or unspecified chronic kidney disease: Secondary | ICD-10-CM | POA: Diagnosis not present

## 2022-04-09 DIAGNOSIS — E1122 Type 2 diabetes mellitus with diabetic chronic kidney disease: Secondary | ICD-10-CM | POA: Diagnosis not present

## 2022-04-09 DIAGNOSIS — E876 Hypokalemia: Secondary | ICD-10-CM | POA: Diagnosis not present

## 2022-04-09 DIAGNOSIS — E611 Iron deficiency: Secondary | ICD-10-CM | POA: Diagnosis not present

## 2022-04-09 DIAGNOSIS — N189 Chronic kidney disease, unspecified: Secondary | ICD-10-CM | POA: Diagnosis not present

## 2022-04-09 DIAGNOSIS — Z6830 Body mass index (BMI) 30.0-30.9, adult: Secondary | ICD-10-CM | POA: Diagnosis not present

## 2022-04-09 DIAGNOSIS — I5032 Chronic diastolic (congestive) heart failure: Secondary | ICD-10-CM | POA: Diagnosis not present

## 2022-04-18 DIAGNOSIS — E611 Iron deficiency: Secondary | ICD-10-CM | POA: Diagnosis not present

## 2022-04-18 DIAGNOSIS — I5032 Chronic diastolic (congestive) heart failure: Secondary | ICD-10-CM | POA: Diagnosis not present

## 2022-04-18 DIAGNOSIS — I129 Hypertensive chronic kidney disease with stage 1 through stage 4 chronic kidney disease, or unspecified chronic kidney disease: Secondary | ICD-10-CM | POA: Diagnosis not present

## 2022-04-18 DIAGNOSIS — Z6830 Body mass index (BMI) 30.0-30.9, adult: Secondary | ICD-10-CM | POA: Diagnosis not present

## 2022-04-18 DIAGNOSIS — E876 Hypokalemia: Secondary | ICD-10-CM | POA: Diagnosis not present

## 2022-04-18 DIAGNOSIS — E1122 Type 2 diabetes mellitus with diabetic chronic kidney disease: Secondary | ICD-10-CM | POA: Diagnosis not present

## 2022-04-18 DIAGNOSIS — R768 Other specified abnormal immunological findings in serum: Secondary | ICD-10-CM | POA: Diagnosis not present

## 2022-04-18 DIAGNOSIS — N189 Chronic kidney disease, unspecified: Secondary | ICD-10-CM | POA: Diagnosis not present

## 2022-04-25 DIAGNOSIS — I10 Essential (primary) hypertension: Secondary | ICD-10-CM | POA: Diagnosis not present

## 2022-04-25 DIAGNOSIS — E782 Mixed hyperlipidemia: Secondary | ICD-10-CM | POA: Diagnosis not present

## 2022-04-26 DIAGNOSIS — R051 Acute cough: Secondary | ICD-10-CM | POA: Diagnosis not present

## 2022-04-26 DIAGNOSIS — R531 Weakness: Secondary | ICD-10-CM | POA: Diagnosis not present

## 2022-04-26 DIAGNOSIS — Z03818 Encounter for observation for suspected exposure to other biological agents ruled out: Secondary | ICD-10-CM | POA: Diagnosis not present

## 2022-04-26 DIAGNOSIS — R0602 Shortness of breath: Secondary | ICD-10-CM | POA: Diagnosis not present

## 2022-04-26 LAB — LIPID PANEL
Chol/HDL Ratio: 3.4 ratio (ref 0.0–4.4)
Cholesterol, Total: 144 mg/dL (ref 100–199)
HDL: 42 mg/dL (ref >39)
LDL Chol Calc (NIH): 77 mg/dL (ref 0–99)
Triglycerides: 141 mg/dL (ref 0–149)
VLDL Cholesterol Cal: 25 mg/dL (ref 5–40)

## 2022-04-26 LAB — CMP14+EGFR
ALT: 16 IU/L (ref 0–32)
AST: 15 IU/L (ref 0–40)
Albumin/Globulin Ratio: 1.3 (ref 1.2–2.2)
Albumin: 4.1 g/dL (ref 3.8–4.8)
Alkaline Phosphatase: 76 IU/L (ref 44–121)
BUN/Creatinine Ratio: 11 — ABNORMAL LOW (ref 12–28)
BUN: 12 mg/dL (ref 8–27)
Bilirubin Total: 0.2 mg/dL (ref 0.0–1.2)
CO2: 26 mmol/L (ref 20–29)
Calcium: 9.9 mg/dL (ref 8.7–10.3)
Chloride: 104 mmol/L (ref 96–106)
Creatinine, Ser: 1.08 mg/dL — ABNORMAL HIGH (ref 0.57–1.00)
Globulin, Total: 3.1 g/dL (ref 1.5–4.5)
Glucose: 93 mg/dL (ref 70–99)
Potassium: 4.7 mmol/L (ref 3.5–5.2)
Sodium: 141 mmol/L (ref 134–144)
Total Protein: 7.2 g/dL (ref 6.0–8.5)
eGFR: 52 mL/min/{1.73_m2} — ABNORMAL LOW (ref >59)

## 2022-04-28 DIAGNOSIS — D649 Anemia, unspecified: Secondary | ICD-10-CM | POA: Diagnosis not present

## 2022-04-28 DIAGNOSIS — D599 Acquired hemolytic anemia, unspecified: Secondary | ICD-10-CM | POA: Diagnosis not present

## 2022-04-28 DIAGNOSIS — M069 Rheumatoid arthritis, unspecified: Secondary | ICD-10-CM | POA: Diagnosis not present

## 2022-04-28 DIAGNOSIS — D519 Vitamin B12 deficiency anemia, unspecified: Secondary | ICD-10-CM | POA: Diagnosis not present

## 2022-05-03 ENCOUNTER — Ambulatory Visit (INDEPENDENT_AMBULATORY_CARE_PROVIDER_SITE_OTHER): Payer: Medicare Other | Admitting: Family Medicine

## 2022-05-03 ENCOUNTER — Other Ambulatory Visit: Payer: Self-pay | Admitting: "Endocrinology

## 2022-05-03 ENCOUNTER — Other Ambulatory Visit (HOSPITAL_COMMUNITY): Payer: Self-pay | Admitting: Family Medicine

## 2022-05-03 ENCOUNTER — Encounter: Payer: Self-pay | Admitting: Family Medicine

## 2022-05-03 VITALS — BP 156/66 | HR 72 | Ht 65.0 in | Wt 181.1 lb

## 2022-05-03 DIAGNOSIS — E663 Overweight: Secondary | ICD-10-CM

## 2022-05-03 DIAGNOSIS — I639 Cerebral infarction, unspecified: Secondary | ICD-10-CM | POA: Diagnosis not present

## 2022-05-03 DIAGNOSIS — K7460 Unspecified cirrhosis of liver: Secondary | ICD-10-CM

## 2022-05-03 DIAGNOSIS — I10 Essential (primary) hypertension: Secondary | ICD-10-CM | POA: Diagnosis not present

## 2022-05-03 DIAGNOSIS — K219 Gastro-esophageal reflux disease without esophagitis: Secondary | ICD-10-CM | POA: Diagnosis not present

## 2022-05-03 DIAGNOSIS — Z794 Long term (current) use of insulin: Secondary | ICD-10-CM | POA: Diagnosis not present

## 2022-05-03 DIAGNOSIS — E1122 Type 2 diabetes mellitus with diabetic chronic kidney disease: Secondary | ICD-10-CM

## 2022-05-03 DIAGNOSIS — Z85038 Personal history of other malignant neoplasm of large intestine: Secondary | ICD-10-CM

## 2022-05-03 DIAGNOSIS — N644 Mastodynia: Secondary | ICD-10-CM | POA: Diagnosis not present

## 2022-05-03 DIAGNOSIS — N181 Chronic kidney disease, stage 1: Secondary | ICD-10-CM | POA: Diagnosis not present

## 2022-05-03 MED ORDER — CARVEDILOL 3.125 MG PO TABS: 3.1250 mg | ORAL_TABLET | Freq: Two times a day (BID) | ORAL | 3 refills | Status: DC

## 2022-05-03 NOTE — Assessment & Plan Note (Signed)
Managed by Endo, [name] is reminded of the importance of commitment to daily physical activity for 30 minutes or more, as able and the need to limit carbohydrate intake to 30 to 60 grams per meal to help with blood sugar control.   The need to take medication as prescribed, test blood sugar as directed, and to call between visits if there is a concern that blood sugar is uncontrolled is also discussed.   [name] is reminded of the importance of daily foot exam, annual eye examination, and good blood sugar, blood pressure and cholesterol control.     Latest Ref Rng & Units 04/25/2022    8:36 AM 12/29/2021    4:43 AM 12/28/2021    4:24 PM 12/14/2021    8:12 AM 12/02/2021    9:25 AM  Diabetic Labs  HbA1c 0.0 - 7.0 %     7.5   Chol 100 - 199 mg/dL 144  142   159    HDL >39 mg/dL 42  34   35    Calc LDL 0 - 99 mg/dL 77  59   90    Triglycerides 0 - 149 mg/dL 141  243   197    Creatinine 0.57 - 1.00 mg/dL 1.08   1.27  1.04        05/03/2022    9:00 AM 05/03/2022    8:57 AM 04/04/2022    8:21 AM 03/04/2022   10:45 AM 03/04/2022   10:26 AM 03/02/2022    1:56 PM 01/28/2022    9:59 AM  BP/Weight  Systolic BP 295 621 308 657 846 962 952  Diastolic BP 66 76 56 62 70 81 72  Wt. (Lbs)  181.08 184.2  184 186   BMI  30.13 kg/m2 30.65 kg/m2  30.62 kg/m2 30.95 kg/m2       Latest Ref Rng & Units 08/03/2021    9:20 AM 03/09/2021   12:00 AM  Foot/eye exam completion dates  Eye Exam No Retinopathy  No Retinopathy      Foot Form Completion  Done      This result is from an external source.

## 2022-05-03 NOTE — Assessment & Plan Note (Signed)
Needs GI f/u rescheduled, was due in 01/2022

## 2022-05-03 NOTE — Assessment & Plan Note (Signed)
Uncontrolled ad carvedilol twice daily DASH diet and commitment to daily physical activity for a minimum of 30 minutes discussed and encouraged, as a part of hypertension management. The importance of attaining a healthy weight is also discussed.     05/03/2022    9:00 AM 05/03/2022    8:57 AM 04/04/2022    8:21 AM 03/04/2022   10:45 AM 03/04/2022   10:26 AM 03/02/2022    1:56 PM 01/28/2022    9:59 AM  BP/Weight  Systolic BP 742 595 638 756 433 295 188  Diastolic BP 66 76 56 62 70 81 72  Wt. (Lbs)  181.08 184.2  184 186   BMI  30.13 kg/m2 30.65 kg/m2  30.62 kg/m2 30.95 kg/m2

## 2022-05-03 NOTE — Assessment & Plan Note (Signed)
R e eval by GI needed, she is referred

## 2022-05-03 NOTE — Assessment & Plan Note (Signed)
  Patient re-educated about  the importance of commitment to a  minimum of 150 minutes of exercise per week as able.  The importance of healthy food choices with portion control discussed, as well as eating regularly and within a 12 hour window most days. The need to choose "clean , green" food 50 to 75% of the time is discussed, as well as to make water the primary drink and set a goal of 64 ounces water daily.       05/03/2022    8:57 AM 04/04/2022    8:21 AM 03/04/2022   10:26 AM  Weight /BMI  Weight 181 lb 1.3 oz 184 lb 3.2 oz 184 lb  Height '5\' 5"'$  (1.651 m) '5\' 5"'$  (1.651 m) '5\' 5"'$  (1.651 m)  BMI 30.13 kg/m2 30.65 kg/m2 30.62 kg/m2

## 2022-05-03 NOTE — Assessment & Plan Note (Signed)
New breast pain with periareolar itching , needs diag mammogram

## 2022-05-03 NOTE — Progress Notes (Signed)
Joanna Brown     MRN: [id number]      DOB: 11-22-1940   HPI [name] is here for follow up and re-evaluation of chronic medical conditions, medication management and review of any available recent lab and radiology data.  Preventive health is updated, specifically  Cancer screening and Immunization.   Questions or concerns regarding consultations or procedures which the PT has had in the interim are  addressed. The PT denies any adverse reactions to current medications since the last visit.   Starting 03/16/2022, has had daily pain and sticking of right middle finger wants to wait on this, I explained  needs Ortho as trigger will not go away on it's own Itching ,and discomfort around left breast nipple starting Dec1, no nipple d/c , states she has lumps all the time, does state she has pain in the left breast at times , new since Dec 1, no personal or family h/o breast cancer C/o elevated and fluctuating blood sugar, followed by Endo  ROS Denies recent fever or chills. Denies sinus pressure, nasal congestion, ear pain or sore throat. Denies chest congestion, productive cough or wheezing. Denies chest pains, palpitations and leg swelling C/o  abdominal pain, and  constipation.  Treated by GI , recently missed follow up and needs to reschedule Denies dysuria, frequency, hesitancy or incontinence. . Denies headaches, seizures, numbness, or tingling. Denies depression, anxiety or insomnia. Denies skin break down or rash.   PE  BP (!) 156/66 (BP Location: Left Arm, Patient Position: Sitting, Cuff Size: Large)   Pulse 72   Ht '5\' 5"'$  (1.651 m)   Wt 181 lb 1.3 oz (82.1 kg)   SpO2 97%   BMI 30.13 kg/m   Patient alert and oriented and in no cardiopulmonary distress.  HEENT: No facial asymmetry, EOMI,     Neck supple .  Chest: Clear to auscultation bilaterally.  CVS: S1, S2 no murmurs, no S3.Regular rate.  ABD: Soft non tender.   Ext: No edema  MS: Adequate ROM spine,  shoulders, hips and knees.right middle trigger finger  Skin: Intact, no ulcerations or rash noted.  Psych: Good eye contact, normal affect. Memory intact not anxious or depressed appearing.  CNS: CN 2-12 intact, power,  normal throughout.no focal deficits noted.   Assessment & Plan  Essential hypertension Uncontrolled ad carvedilol twice daily DASH diet and commitment to daily physical activity for a minimum of 30 minutes discussed and encouraged, as a part of hypertension management. The importance of attaining a healthy weight is also discussed.     05/03/2022    9:00 AM 05/03/2022    8:57 AM 04/04/2022    8:21 AM 03/04/2022   10:45 AM 03/04/2022   10:26 AM 03/02/2022    1:56 PM 01/28/2022    9:59 AM  BP/Weight  Systolic BP 818 563 149 702 637 858 850  Diastolic BP 66 76 56 62 70 81 72  Wt. (Lbs)  181.08 184.2  184 186   BMI  30.13 kg/m2 30.65 kg/m2  30.62 kg/m2 30.95 kg/m2        Type 2 diabetes mellitus with stage 1 chronic kidney disease, with long-term current use of insulin ([place]) Managed by Endo, [name] is reminded of the importance of commitment to daily physical activity for 30 minutes or more, as able and the need to limit carbohydrate intake to 30 to 60 grams per meal to help with blood sugar control.   The need to take medication as  prescribed, test blood sugar as directed, and to call between visits if there is a concern that blood sugar is uncontrolled is also discussed.   [name] is reminded of the importance of daily foot exam, annual eye examination, and good blood sugar, blood pressure and cholesterol control.     Latest Ref Rng & Units 04/25/2022    8:36 AM 12/29/2021    4:43 AM 12/28/2021    4:24 PM 12/14/2021    8:12 AM 12/02/2021    9:25 AM  Diabetic Labs  HbA1c 0.0 - 7.0 %     7.5   Chol 100 - 199 mg/dL 144  142   159    HDL >39 mg/dL 42  34   35    Calc LDL 0 - 99 mg/dL 77  59   90    Triglycerides 0 - 149 mg/dL 141  243   197     Creatinine 0.57 - 1.00 mg/dL 1.08   1.27  1.04        05/03/2022    9:00 AM 05/03/2022    8:57 AM 04/04/2022    8:21 AM 03/04/2022   10:45 AM 03/04/2022   10:26 AM 03/02/2022    1:56 PM 01/28/2022    9:59 AM  BP/Weight  Systolic BP 570 177 939 030 092 330 076  Diastolic BP 66 76 56 62 70 81 72  Wt. (Lbs)  181.08 184.2  184 186   BMI  30.13 kg/m2 30.65 kg/m2  30.62 kg/m2 30.95 kg/m2       Latest Ref Rng & Units 08/03/2021    9:20 AM 03/09/2021   12:00 AM  Foot/eye exam completion dates  Eye Exam No Retinopathy  No Retinopathy      Foot Form Completion  Done      This result is from an external source.        GERD (gastroesophageal reflux disease) R e eval by GI needed, she is referred  Hepatic cirrhosis ([place]) Needs to f/u with GI  Overweight (BMI 25.0-29.9)  Patient re-educated about  the importance of commitment to a  minimum of 150 minutes of exercise per week as able.  The importance of healthy food choices with portion control discussed, as well as eating regularly and within a 12 hour window most days. The need to choose "clean , green" food 50 to 75% of the time is discussed, as well as to make water the primary drink and set a goal of 64 ounces water daily.       05/03/2022    8:57 AM 04/04/2022    8:21 AM 03/04/2022   10:26 AM  Weight /BMI  Weight 181 lb 1.3 oz 184 lb 3.2 oz 184 lb  Height '5\' 5"'$  (1.651 m) '5\' 5"'$  (1.651 m) '5\' 5"'$  (1.651 m)  BMI 30.13 kg/m2 30.65 kg/m2 30.62 kg/m2      History of colon cancer Needs GI f/u rescheduled, was due in 01/2022  Breast pain, left New breast pain with periareolar itching , needs diag mammogram

## 2022-05-03 NOTE — Assessment & Plan Note (Signed)
Needs to f/u with GI

## 2022-05-03 NOTE — Patient Instructions (Signed)
F/U  in 8 weeks, re evaluate blood pressure, call if you need me sooner   New medication for BP is carvedilol take one every 12 hours  You are referred for mammogram of left breast, also to  GI  Call about right middle finger for Ortho referral when ready  It is important that you exercise regularly at least 30 minutes 5 times a week. If you develop chest pain, have severe difficulty breathing, or feel very tired, stop exercising immediately and seek medical attention   It is important that you exercise regularly at least 30 minutes 5 times a week. If you develop chest pain, have severe difficulty breathing, or feel very tired, stop exercising immediately and seek medical attention    Thanks for choosing [facility], we consider it a privelige to serve you.

## 2022-05-18 ENCOUNTER — Emergency Department (HOSPITAL_COMMUNITY): Payer: Medicare Other

## 2022-05-18 ENCOUNTER — Emergency Department (HOSPITAL_COMMUNITY)
Admission: EM | Admit: 2022-05-18 | Discharge: 2022-05-18 | Disposition: A | Discharge status: Home / Self Care | Payer: Medicare Other | Attending: Emergency Medicine | Admitting: Emergency Medicine

## 2022-05-18 DIAGNOSIS — N1831 Chronic kidney disease, stage 3a: Secondary | ICD-10-CM | POA: Insufficient documentation

## 2022-05-18 DIAGNOSIS — Z794 Long term (current) use of insulin: Secondary | ICD-10-CM | POA: Insufficient documentation

## 2022-05-18 DIAGNOSIS — M545 Low back pain, unspecified: Secondary | ICD-10-CM | POA: Diagnosis not present

## 2022-05-18 DIAGNOSIS — I129 Hypertensive chronic kidney disease with stage 1 through stage 4 chronic kidney disease, or unspecified chronic kidney disease: Secondary | ICD-10-CM | POA: Diagnosis not present

## 2022-05-18 DIAGNOSIS — M549 Dorsalgia, unspecified: Secondary | ICD-10-CM | POA: Insufficient documentation

## 2022-05-18 DIAGNOSIS — M79651 Pain in right thigh: Secondary | ICD-10-CM | POA: Diagnosis not present

## 2022-05-18 DIAGNOSIS — Z7984 Long term (current) use of oral hypoglycemic drugs: Secondary | ICD-10-CM | POA: Insufficient documentation

## 2022-05-18 DIAGNOSIS — R600 Localized edema: Secondary | ICD-10-CM | POA: Insufficient documentation

## 2022-05-18 DIAGNOSIS — Z79899 Other long term (current) drug therapy: Secondary | ICD-10-CM | POA: Diagnosis not present

## 2022-05-18 DIAGNOSIS — E119 Type 2 diabetes mellitus without complications: Secondary | ICD-10-CM | POA: Insufficient documentation

## 2022-05-18 DIAGNOSIS — M25551 Pain in right hip: Secondary | ICD-10-CM | POA: Insufficient documentation

## 2022-05-18 DIAGNOSIS — M79604 Pain in right leg: Secondary | ICD-10-CM | POA: Diagnosis not present

## 2022-05-18 MED ORDER — IBUPROFEN 600 MG PO TABS: 600.0000 mg | ORAL_TABLET | Freq: Four times a day (QID) | ORAL | 0 refills | Status: DC | PRN

## 2022-05-18 MED ORDER — IBUPROFEN 400 MG PO TABS
600.0000 mg | ORAL_TABLET | Freq: Once | ORAL | Status: AC
  Administered 2022-05-18: 600 mg via ORAL
  Filled 2022-05-18: qty 2

## 2022-05-18 NOTE — ED Provider Notes (Signed)
[facility] Provider Note   CSN: [id number] Arrival date & time: 05/18/22  1551     History  Chief Complaint  Patient presents with   Hip Pain    right    Joanna Brown is a 82 y.o. female.   Hip Pain   82 year old female presents emergency department complaints of right hip/back/thigh pain.  Patient reports history of 3-day of pain.  Reports symptoms beginning when she was standing cooking breakfast 3 days ago.  Has taken no medication for this.  Denies weakness/sensory deficits in lower extremities, saddle anesthesia, bowel/bladder dysfunction from baseline, fever, no malignancy, chronic steroid use.  Denies any known trauma/fall.  States she has been able to ambulate but with pain.  Is also noted subsequent lower extremity swelling bilaterally since her prior stroke 2 to 3 months ago of which has not worsened acutely over the past 3 days.  Past medical history significant for hyperlipidemia, hypertension, GERD, diabetes mellitus, CVA, CKD 3 AA  Home Medications Prior to Admission medications   Medication Sig Start Date End Date Taking? Authorizing Provider  ibuprofen (ADVIL) 600 MG tablet Take 1 tablet (600 mg total) by mouth every 6 (six) hours as needed. 05/18/22  Yes Dion Saucier A, PA  acetaminophen (TYLENOL) 500 MG tablet Take 500-1,000 mg by mouth every 6 (six) hours as needed (pain).    [provider]  ALPHAGAN P 0.1 % SOLN Place 1 drop into both eyes 2 (two) times daily. 09/16/19   [provider]  amLODipine (NORVASC) 10 MG tablet TAKE 1 TABLET(10 MG) BY MOUTH EVERY MORNING 04/04/22   Fayrene Helper, MD  Ascorbic Acid (VITAMIN C) 1000 MG tablet Take 1,000 mg by mouth 4 (four) times a week. AT NIGHT    [provider]  benazepril (LOTENSIN) 40 MG tablet TAKE 1 TABLET(40 MG) BY MOUTH DAILY 04/04/22   Fayrene Helper, MD  Blood Glucose Monitoring Suppl (ACCU-CHEK GUIDE) w/Device KIT 1 each by Does not apply route 4  (four) times daily. 04/05/22   Cassandria Anger, MD  carvedilol (COREG) 3.125 MG tablet Take 1 tablet (3.125 mg total) by mouth 2 (two) times daily with a meal. 05/03/22   Fayrene Helper, MD  cholecalciferol (VITAMIN D3) 25 MCG (1000 UT) tablet Take 1,000 Units by mouth in the morning.    [provider]  ezetimibe (ZETIA) 10 MG tablet Take 1 tablet (10 mg total) by mouth daily. 10/23/20   Almyra Deforest, PA  glipiZIDE (GLUCOTROL XL) 5 MG 24 hr tablet TAKE 1 TABLET(5 MG) BY MOUTH DAILY WITH BREAKFAST 04/04/22   Cassandria Anger, MD  glucose blood (ACCU-CHEK GUIDE) test strip 1 each by Other route in the morning, at noon, in the evening, and at bedtime. Use as instructed 4 x daily. E11.65 04/05/22   Cassandria Anger, MD  insulin isophane & regular human KwikPen (HUMULIN 70/30 KWIKPEN) (70-30) 100 UNIT/ML KwikPen INJECT 60 UNITS UNDER THE SKIN EVERY MORNING AND 40 UNITS EVERY NIGHT 05/03/22   Nida, Marella Chimes, MD  Insulin Pen Needle (B-D ULTRAFINE III SHORT PEN) 31G X 8 MM MISC USE WITH INSULIN TWICE DAILY AS DIRECTED 12/23/21   Nida, Marella Chimes, MD  latanoprost (XALATAN) 0.005 % ophthalmic solution Place 1 drop into both eyes at bedtime.  02/18/19   [provider]  linaclotide Rolan Lipa) 290 MCG CAPS capsule Take 1 capsule (290 mcg total) by mouth daily before breakfast. Patient not taking: Reported on 05/03/2022 08/10/21  Annitta Needs, NP  metFORMIN (GLUCOPHAGE) 500 MG tablet Take 1 tablet (500 mg total) by mouth daily with breakfast. 12/02/21   Cassandria Anger, MD  Multiple Vitamin (MULTIVITAMIN WITH MINERALS) TABS tablet Take 1 tablet by mouth daily.    [provider]  Omega-3 Fatty Acids (FISH OIL) 1200 MG CAPS Take 1,200 mg by mouth every morning.    [provider]  pantoprazole (PROTONIX) 40 MG tablet TAKE 1 TABLET(40 MG) BY MOUTH DAILY 30 MINUTES BEFORE A MEAL Patient not taking: Reported on 05/03/2022 10/18/21   Fayrene Helper, MD  polyethylene glycol (MIRALAX) 17 g packet Take 17 g by mouth at bedtime. 05/05/20   Noralyn Pick, NP  pravastatin (PRAVACHOL) 80 MG tablet TAKE 1 TABLET(80 MG) BY MOUTH DAILY 01/04/22   Fayrene Helper, MD      Allergies    Senokot wheat bran [wheat bran] and Spironolactone    Review of Systems   Review of Systems  All other systems reviewed and are negative.   Physical Exam Updated Vital Signs BP (!) 174/83   Pulse 83   Temp 98.3 F (36.8 C) (Oral)   Resp 18   Ht _0  (1.651 m)   Wt 83.9 kg   SpO2 98%   BMI 30.79 kg/m  Physical Exam Vitals and nursing note reviewed.  Constitutional:      General: She is not in acute distress.    Appearance: She is well-developed.  HENT:     Head: Normocephalic and atraumatic.  Eyes:     Conjunctiva/sclera: Conjunctivae normal.  Cardiovascular:     Rate and Rhythm: Normal rate and regular rhythm.     Heart sounds: No murmur heard. Pulmonary:     Effort: Pulmonary effort is normal. No respiratory distress.     Breath sounds: Normal breath sounds.  Abdominal:     Palpations: Abdomen is soft.     Tenderness: There is no abdominal tenderness.  Musculoskeletal:     Cervical back: Neck supple.     Right lower leg: Edema present.     Left lower leg: Edema present.     Comments: 1-2+ bilateral lower extremity edema appreciated.  Mild tender to palpation midline of lumbar spine.  No paraspinal tenderness bilaterally.  Tender palpation of right hip.  Patient has full range of motion of bilateral hips in flexion, extension, knee flexion/extension, ankle dorsi/plantarflexion.  Strength symmetric bilaterally.  Pedal pulses symmetric bilaterally.  DTR symmetric and equal bilaterally.  No sensory deficits along major nerve distributions of lower extremities.  Skin:    General: Skin is warm and dry.     Capillary Refill: Capillary refill takes less than 2 seconds.  Neurological:     Mental Status: She is alert.   Psychiatric:        Mood and Affect: Mood normal.     ED Results / Procedures / Treatments   Labs (all labs ordered are listed, but only abnormal results are displayed) Labs Reviewed - No data to display  EKG None  Radiology US Venous Img Lower Right (DVT Study)  Result Date: 05/18/2022 CLINICAL DATA:  RIGHT hip and lower extremity pain with bilateral leg edema. EXAM: RIGHT LOWER EXTREMITY VENOUS DOPPLER ULTRASOUND TECHNIQUE: Gray-scale sonography with compression, as well as color and duplex ultrasound, were performed to evaluate the deep venous system(s) from the level of the common femoral vein through the popliteal and proximal calf veins. COMPARISON:  None Available. FINDINGS: VENOUS Normal compressibility  of the common femoral, superficial femoral, and popliteal veins, as well as the visualized calf veins. Visualized portions of profunda femoral vein and great saphenous vein unremarkable. No filling defects to suggest DVT on grayscale or color Doppler imaging. Doppler waveforms show normal direction of venous flow, normal respiratory plasticity and response to augmentation. Limited views of the contralateral common femoral vein are unremarkable. OTHER None. Limitations: none IMPRESSION: Negative. Electronically Signed   By: Zetta Bills M.D.   On: 05/18/2022 17:37   DG Lumbar Spine Complete  Result Date: 05/18/2022 CLINICAL DATA:  Back pain, right hip pain EXAM: LUMBAR SPINE - COMPLETE 4+ VIEW COMPARISON:  11/06/2019 FINDINGS: No recent fracture is seen. Alignment of posterior margins of vertebral bodies is unremarkable. Degenerative changes are noted with bony spurs and facet hypertrophy at multiple levels. There is mild disc space narrowing at L3-L4 and L4-L5 levels. Extensive arterial calcifications are seen. In the AP view, there is 1.3 cm smooth marginated opacity in the right paraspinal region at L2-L3 level. This may suggest oral medication in the GI tract. This structure does not  have the typical appearance of renal or gallbladder stone. Surgical clips are seen in gallbladder fossa. IMPRESSION: No recent fracture is seen in the lumbar spine. Moderate to marked degenerative changes are noted. 1.3 cm round opacity in right side of abdomen may suggest bowel content. Aortic atherosclerosis. Electronically Signed   By: Elmer Picker M.D.   On: 05/18/2022 17:25   DG Hip Unilat With Pelvis 2-3 Views Right  Result Date: 05/18/2022 CLINICAL DATA:  Right hip pain for 3 days, no stated injury EXAM: DG HIP (WITH OR WITHOUT PELVIS) 2-3V RIGHT COMPARISON:  None Available. FINDINGS: There is no evidence of acute, displaced hip fracture or dislocation. Chronic fracture deformities of the left pubic bones. Mild femoroacetabular arthrosis. Nonobstructive pattern of overlying bowel gas. IMPRESSION: 1. No evidence of acute, displaced fracture or dislocation of the right hip. 2. Chronic fracture deformities of the left pubic bones. 3. Mild right femoroacetabular arthrosis. Electronically Signed   By: Delanna Ahmadi M.D.   On: 05/18/2022 17:24    Procedures Procedures    Medications Ordered in ED Medications  ibuprofen (ADVIL) tablet 600 mg (600 mg Oral Given 05/18/22 1701)    ED Course/ Medical Decision Making/ A&P Clinical Course as of 05/18/22 1756  Wed May 18, 2022  1656 Shared decision-making conversation was had with the patient.  Patient requesting ultrasound imaging of her lower extremities given persistent swelling since stroke a few months ago with current right hip pain.  I discussed low suspicion of blood clot in the leg given area of tenderness on exam as well as no increase in swelling from baseline but patient persisted.  Ultrasound to be ordered along with plain films of right hip and low back. [CR]    Clinical Course User Index [CR] Wilnette Kales, PA                           Medical Decision Making Amount and/or Complexity of Data Reviewed Radiology:  ordered.  Risk Prescription drug management.   This patient presents to the ED for concern of hip pain, this involves an extensive number of treatment options, and is a complaint that carries with it a high risk of complications and morbidity.  The differential diagnosis includes fracture, strain, sprain, dislocation, septic arthritis, osteoarthritis, rheumatoid arthritis, osteomyelitis, avascular necrosis, cellulitis, erysipelas, DVT/PAD   Co morbidities that  complicate the patient evaluation  See HPI   Additional history obtained:  Additional history obtained from EMR External records from outside source obtained and reviewed including hospital records   Lab Tests:  N/a   Imaging Studies ordered:  I ordered imaging studies including lower extremity ultrasound, lumbar spine x-ray, pelvis with right hip x-ray I independently visualized and interpreted imaging which showed  DVT ultrasound: Negative for any acute DVT Lumbar x-ray: No recent fracture of the lumbar spine.  Moderate to marked degenerative changes are noted.  1.3 cm round opacity in right send abdomen mistress bowel content.  Aortic atherosclerosis. Pelvis with right hip x-ray: No evidence of acute fracture, displaced fracture dislocation of right hip.  Chronic fracture deformities of left pubic bones.  Mild right femoral acetabular arthrosis. I agree with the radiologist interpretation  Cardiac Monitoring: / EKG:  The patient was maintained on a cardiac monitor.  I personally viewed and interpreted the cardiac monitored which showed an underlying rhythm of: Sinus rhythm yesterday here at her   Consultations Obtained:  N/a   Problem List / ED Course / Critical interventions / Medication management  Right hip pain I ordered medication including advil  for pain  Reevaluation of the patient after these medicines showed that the patient improved I have reviewed the patients home medicines and have made  adjustments as needed   Social Determinants of Health:  Denies tobacco, licit drug use   Test / Admission - Considered:  Right hip pain Vitals signs significant for hypertension with blood pressure 174/83.  Recommend follow-up with primary care regarding elevation blood pressure as well as continuing at home antihypertensives.. Otherwise within normal range and stable throughout visit. Imaging studies significant for: See above Patient's right hip pain most likely secondary to osteoarthritis as indicated above.  Patient has tried no medications for this will continue Advil from the emergency department.  Continue rest, ice, elevation affected extremity.  Patient given orthopedic information to follow-up with outpatient.  Treatment plan discussed at length with patient and she acknowledged understanding was agreeable to said plan. Worrisome signs and symptoms were discussed with the patient, and the patient acknowledged understanding to return to the ED if noticed. Patient was stable upon discharge.          Final Clinical Impression(s) / ED Diagnoses Final diagnoses:  Right hip pain    Rx / DC Orders ED Discharge Orders          Ordered    ibuprofen (ADVIL) 600 MG tablet  Every 6 hours PRN        05/18/22 1742              Wilnette Kales, [place] 05/18/22 1756    Sherwood Gambler, MD 05/22/22 [phone]

## 2022-05-18 NOTE — Discharge Instructions (Addendum)
Note the workup today was overall reassuring.  As discussed, have mild arthritis of your right hip which is most likely causing your pain.  Recommend rest, ice, elevation of affected extremity as well as NSAIDs in the form of ibuprofen to take as needed for pain.  I have attached number to call for outpatient scheduling for follow-up appointment.  Please do not hesitate to return to emergency department for worrisome signs and symptoms we discussed become apparent.

## 2022-05-18 NOTE — ED Triage Notes (Signed)
Pt to ED c/o right hip pain x 3 days. Ambulatory in triage. No known injury.

## 2022-05-19 ENCOUNTER — Ambulatory Visit (HOSPITAL_COMMUNITY)
Admission: RE | Admit: 2022-05-19 | Discharge: 2022-05-19 | Disposition: A | Discharge status: Home / Self Care | Payer: Medicare Other | Source: Ambulatory Visit | Attending: Family Medicine | Admitting: Family Medicine

## 2022-05-19 DIAGNOSIS — R928 Other abnormal and inconclusive findings on diagnostic imaging of breast: Secondary | ICD-10-CM | POA: Diagnosis not present

## 2022-05-19 DIAGNOSIS — N644 Mastodynia: Secondary | ICD-10-CM | POA: Insufficient documentation

## 2022-05-19 DIAGNOSIS — N6489 Other specified disorders of breast: Secondary | ICD-10-CM | POA: Diagnosis not present

## 2022-05-20 ENCOUNTER — Other Ambulatory Visit: Payer: Self-pay

## 2022-05-20 ENCOUNTER — Telehealth: Payer: Self-pay | Admitting: Family Medicine

## 2022-05-20 ENCOUNTER — Telehealth: Payer: Self-pay

## 2022-05-20 DIAGNOSIS — N644 Mastodynia: Secondary | ICD-10-CM

## 2022-05-20 DIAGNOSIS — M25559 Pain in unspecified hip: Secondary | ICD-10-CM

## 2022-05-20 NOTE — Telephone Encounter (Signed)
Referral sent 

## 2022-05-20 NOTE — Telephone Encounter (Signed)
Patient called in requesting referral to Dr. Aline Brochure for hip pains. Patient wants a call back in regard.

## 2022-05-20 NOTE — Telephone Encounter (Signed)
        Patient  visited [facility] on 05/18/2022  for rt hip pain.   Telephone encounter attempt :  1st  A HIPAA compliant voice message was left requesting a return call.  Instructed patient to call back at [phone].   [facility] Resource Care Guide   ??[email or web address]  ?? [phone]   Website: [email or web address]  [email or web address]

## 2022-05-24 ENCOUNTER — Telehealth: Payer: Self-pay

## 2022-05-24 NOTE — Telephone Encounter (Signed)
     Patient  visit on 05/18/2022  at [facility] was for rt hip pain.  Have you been able to follow up with your primary care physician? Patient stated she spoke with her PCP.  The patient was or was not able to obtain any needed medicine or equipment. Patient obtained medication.  Are there diet recommendations that you are having difficulty following? No  Patient expresses understanding of discharge instructions and education provided has no other needs at this time.    [facility] Resource Care Guide   ??[email or web address]  ?? [phone]   Website: [email or web address]

## 2022-06-13 ENCOUNTER — Encounter: Payer: Self-pay | Admitting: Orthopedic Surgery

## 2022-06-13 ENCOUNTER — Ambulatory Visit (INDEPENDENT_AMBULATORY_CARE_PROVIDER_SITE_OTHER): Payer: Medicare Other | Admitting: Orthopedic Surgery

## 2022-06-13 VITALS — BP 168/82 | HR 72 | Ht 65.0 in | Wt 180.6 lb

## 2022-06-13 DIAGNOSIS — M5136 Other intervertebral disc degeneration, lumbar region: Secondary | ICD-10-CM | POA: Diagnosis not present

## 2022-06-13 DIAGNOSIS — G8929 Other chronic pain: Secondary | ICD-10-CM | POA: Diagnosis not present

## 2022-06-13 DIAGNOSIS — M5416 Radiculopathy, lumbar region: Secondary | ICD-10-CM

## 2022-06-13 NOTE — Progress Notes (Signed)
Patient ID: Joanna Brown, female   DOB: 26-Sep-1940, 82 y.o.   MRN: [id number]    ASSESSMENT AND PLAN:  Encounter Diagnoses  Name Primary?   Chronic radicular lumbar pain    Degenerative disc disease, lumbar Yes   This is an 82 year old female with lower back pain normal hip x-rays degenerative disc disease previously untreated  Recommend physical therapy, Tylenol and meloxicam  No follow-up needed  See primary care doctor if not improved for referral to neurosurgery.  Chief Complaint  Patient presents with   New Patient (Initial Visit)   Hip Pain    RT hip/ had xrays at ED Lumbar + Hip xrays on 05/18/2022    HPI Joanna Brown is a 82 y.o. female.  82 year old female presents with right-sided lower back pain nonradicular in nature seems to be isolated over the right buttock  Patient is diabetic.  Seems to have had symptoms for few weeks now if not months  No red flags are noted.  Review of systems ringing in the ears sore throat some leg swelling some chest pain blurred vision heartburn constipation otherwise normal  No groin pain  Allergies  Allergen Reactions   Senokot Wheat Bran [Wheat Bran]     ABDOMINAL CRAMPS   Spironolactone     Stomach problems, vision changes    Current Outpatient Medications  Medication Instructions   acetaminophen (TYLENOL) 500-1,000 mg, Oral, Every 6 hours PRN   ALPHAGAN P 0.1 % SOLN 1 drop, Both Eyes, 2 times daily   amLODipine (NORVASC) 10 MG tablet TAKE 1 TABLET(10 MG) BY MOUTH EVERY MORNING   benazepril (LOTENSIN) 40 MG tablet TAKE 1 TABLET(40 MG) BY MOUTH DAILY   Blood Glucose Monitoring Suppl (ACCU-CHEK GUIDE) w/Device KIT 1 each, Does not apply, 4 times daily   carvedilol (COREG) 3.125 mg, Oral, 2 times daily with meals   cholecalciferol (VITAMIN D3) 1,000 Units, Oral, Every morning   ezetimibe (ZETIA) 10 mg, Oral, Daily   Fish Oil 1,200 mg, Oral, Every morning   glipiZIDE (GLUCOTROL XL) 5 MG 24 hr tablet TAKE 1 TABLET(5 MG)  BY MOUTH DAILY WITH BREAKFAST   glucose blood (ACCU-CHEK GUIDE) test strip 1 each, Other, 4 times daily, Use as instructed 4 x daily. E11.65   ibuprofen (ADVIL) 600 mg, Oral, Every 6 hours PRN   insulin isophane & regular human KwikPen (HUMULIN 70/30 KWIKPEN) (70-30) 100 UNIT/ML KwikPen INJECT 60 UNITS UNDER THE SKIN EVERY MORNING AND 40 UNITS EVERY NIGHT   Insulin Pen Needle (B-D ULTRAFINE III SHORT PEN) 31G X 8 MM MISC USE WITH INSULIN TWICE DAILY AS DIRECTED   latanoprost (XALATAN) 0.005 % ophthalmic solution 1 drop, Both Eyes, Daily at bedtime   linaclotide (LINZESS) 290 mcg, Oral, Daily before breakfast   metFORMIN (GLUCOPHAGE) 500 mg, Oral, Daily with breakfast   Multiple Vitamin (MULTIVITAMIN WITH MINERALS) TABS tablet 1 tablet, Oral, Daily   pantoprazole (PROTONIX) 40 MG tablet TAKE 1 TABLET(40 MG) BY MOUTH DAILY 30 MINUTES BEFORE A MEAL   polyethylene glycol (MIRALAX) 17 g, Oral, Daily at bedtime   pravastatin (PRAVACHOL) 80 MG tablet TAKE 1 TABLET(80 MG) BY MOUTH DAILY   vitamin C 1,000 mg, Oral, 4 times weekly, AT NIGHT    Review of Systems Review of Systems see HPI  Past Medical History:  Diagnosis Date   Anginal pain ([place])    Arthritis    OA   Diabetes mellitus    Dyspnea    Female bladder prolapse  GERD (gastroesophageal reflux disease)    Glaucoma    Hyperlipidemia    Hypertension    echo and stress 4/10 reports on chart, EKG ` LOV 9/12 on chart   S/P TAVR (transcatheter aortic valve replacement) 03/23/2021   Edwards 59m S3U TF approach with Dr. MAngelena Formand Dr. BCyndia Bent  Severe aortic stenosis    Thyroid disease     Past Surgical History:  Procedure Laterality Date   ABDOMINAL HYSTERECTOMY     ANTERIOR AND POSTERIOR REPAIR  04/26/2011   Procedure: ANTERIOR (CYSTOCELE) AND POSTERIOR REPAIR (RECTOCELE);  Surgeon: SReece Packer MD;  [facility]: [facility];  Service: Urology;  Laterality: N/A;   BIOPSY  05/25/2020   Procedure: BIOPSY;  Surgeon: DDoran Stabler MD;  [facility];  Service: Gastroenterology;;   CATARACT EXTRACTION Bilateral    with IOL   CHOLECYSTECTOMY  2009   COLONOSCOPY N/A 12/02/2013   three colon polyps removed, small internal hemorrhoids. Hyperplastic polyps   COLONOSCOPY N/A 11/20/2019   pancolonic diverticulosis, two 10-11 mm polyps in ascending colon, one 5 mm polyp in cecum, ascending colon with superficially invasive adenocarcinoma arising in background of sessile serrated polyps with low and high grade cytologic dysplasia.   COLONOSCOPY     COLONOSCOPY W/ POLYPECTOMY     COLONOSCOPY WITH PROPOFOL N/A 05/25/2020   diverticulosis in right colon, redundant colon. Caution warranted on future colonoscopy in light of age, cardiac condition, challenging anatomy.    DILATION AND CURETTAGE OF UTERUS     pt states this was in the 1970's   ESOPHAGOGASTRODUODENOSCOPY (EGD) WITH PROPOFOL N/A 05/25/2020   Grade 1 esophageal varices, single mucosal nodule in stomach s/p biopsy. (hyperplastic)>    LEFT HEART CATH  09/10/2008   normal coronary arteries, normal LV systolic function, EF 629%(Dr. HNorlene Duel   [place]   [place] arm   NM MYOCAR PERF WALL MOTION  2010   dipyridamole - mild-mod in intenstiy perfusion defect in mid anterior, mid anteroseptal wall, EF 70%   OVARY SURGERY     bilateral tumors removed   POLYPECTOMY  11/20/2019   Procedure: POLYPECTOMY;  Surgeon: RDaneil Dolin MD;  [facility];  Service: Endoscopy;;  hot and cold snare cecal polyp, and asending polyps x 2   RIGHT/LEFT HEART CATH AND CORONARY ANGIOGRAPHY N/A 10/02/2020   Procedure: RIGHT/LEFT HEART CATH AND CORONARY ANGIOGRAPHY;  Surgeon: JMartinique Peter M, MD;  [facility];  Service: Cardiovascular;  Laterality: N/A;   THYROIDECTOMY     THYROIDECTOMY  02/2008   TRANSCATHETER AORTIC VALVE REPLACEMENT, TRANSFEMORAL N/A 03/23/2021   Procedure: TRANSCATHETER AORTIC VALVE REPLACEMENT, TRANSFEMORAL;   Surgeon: MBurnell Blanks MD;  [facility];  Service: Open Heart Surgery;  Laterality: N/A;   TRANSTHORACIC ECHOCARDIOGRAM  08/2011   EF=>55%, mild conc LVH; trace MR; mild TR; mild-mod AV calcification with mild valvular AV stenosis   VAGINAL PROLAPSE REPAIR  04/26/2011   Procedure: VAGINAL VAULT SUSPENSION;  Surgeon: SReece Packer MD;  [facility];  Service: Urology;  Laterality: N/A;  with Graft  10x6    Family History  Problem Relation Age of Onset   Stomach cancer Mother 580  Heart disease Mother 873      heart disease   Heart disease Father 513      MI   Stroke Maternal Grandfather    Heart attack Paternal Grandfather    Hypertension  Brother    Bone cancer Brother    Hypertension Sister    Liver cancer Sister    Hypertension Sister    Hypertension Child    Colon cancer Neg Hx    Colon polyps Neg Hx    Pancreatic cancer Neg Hx    Esophageal cancer Neg Hx    Rectal cancer Neg Hx    was reviewed  Social History Social History   Tobacco Use   Smoking status: Never   Smokeless tobacco: Never  Vaping Use   Vaping Use: Never used  Substance Use Topics   Alcohol use: Not Currently   Drug use: No    Allergies  Allergen Reactions   Senokot Wheat Bran [Wheat Bran]     ABDOMINAL CRAMPS   Spironolactone     Stomach problems, vision changes     Current Outpatient Medications  Medication Sig Dispense Refill   acetaminophen (TYLENOL) 500 MG tablet Take 500-1,000 mg by mouth every 6 (six) hours as needed (pain).     ALPHAGAN P 0.1 % SOLN Place 1 drop into both eyes 2 (two) times daily.     amLODipine (NORVASC) 10 MG tablet TAKE 1 TABLET(10 MG) BY MOUTH EVERY MORNING 90 tablet 3   Ascorbic Acid (VITAMIN C) 1000 MG tablet Take 1,000 mg by mouth 4 (four) times a week. AT NIGHT     benazepril (LOTENSIN) 40 MG tablet TAKE 1 TABLET(40 MG) BY MOUTH DAILY 90 tablet 1   Blood Glucose Monitoring Suppl (ACCU-CHEK GUIDE) w/Device KIT 1 each by  Does not apply route 4 (four) times daily. 1 kit 0   carvedilol (COREG) 3.125 MG tablet Take 1 tablet (3.125 mg total) by mouth 2 (two) times daily with a meal. 60 tablet 3   cholecalciferol (VITAMIN D3) 25 MCG (1000 UT) tablet Take 1,000 Units by mouth in the morning.     ezetimibe (ZETIA) 10 MG tablet Take 1 tablet (10 mg total) by mouth daily. 90 tablet 3   glipiZIDE (GLUCOTROL XL) 5 MG 24 hr tablet TAKE 1 TABLET(5 MG) BY MOUTH DAILY WITH BREAKFAST 90 tablet 0   glucose blood (ACCU-CHEK GUIDE) test strip 1 each by Other route in the morning, at noon, in the evening, and at bedtime. Use as instructed 4 x daily. E11.65 150 each 3   ibuprofen (ADVIL) 600 MG tablet Take 1 tablet (600 mg total) by mouth every 6 (six) hours as needed. 30 tablet 0   insulin isophane & regular human KwikPen (HUMULIN 70/30 KWIKPEN) (70-30) 100 UNIT/ML KwikPen INJECT 60 UNITS UNDER THE SKIN EVERY MORNING AND 40 UNITS EVERY NIGHT 90 mL 0   Insulin Pen Needle (B-D ULTRAFINE III SHORT PEN) 31G X 8 MM MISC USE WITH INSULIN TWICE DAILY AS DIRECTED 200 each 2   latanoprost (XALATAN) 0.005 % ophthalmic solution Place 1 drop into both eyes at bedtime.      linaclotide (LINZESS) 290 MCG CAPS capsule Take 1 capsule (290 mcg total) by mouth daily before breakfast. 90 capsule 3   metFORMIN (GLUCOPHAGE) 500 MG tablet Take 1 tablet (500 mg total) by mouth daily with breakfast. 90 tablet 1   Multiple Vitamin (MULTIVITAMIN WITH MINERALS) TABS tablet Take 1 tablet by mouth daily.     Omega-3 Fatty Acids (FISH OIL) 1200 MG CAPS Take 1,200 mg by mouth every morning.     pantoprazole (PROTONIX) 40 MG tablet TAKE 1 TABLET(40 MG) BY MOUTH DAILY 30 MINUTES BEFORE A MEAL 90 tablet 1   polyethylene  glycol (MIRALAX) 17 g packet Take 17 g by mouth at bedtime. 14 each 0   pravastatin (PRAVACHOL) 80 MG tablet TAKE 1 TABLET(80 MG) BY MOUTH DAILY 90 tablet 3   No current facility-administered medications for this visit.       Physical Exam BP  (!) 168/82   Pulse 72   Ht '5\' 5"'$  (1.651 m)   Wt 180 lb 9.6 oz (81.9 kg)   BMI 30.05 kg/m   Gen. appearance: The patient is well-developed and well-nourished grooming and hygiene are normal The patient is oriented to person place and time The patient's mood is normal and the affect is normal   Gait assessment: The patient stands with  normal gait and station  Lumbar spine Tenderness  to palpation is noted in the lower L4-5 and 5 S1 segment  Range of motion slight decrease in flexion extension with some discomfort on extension Muscle tone    normal on the right and left sides of the spine  Lower extremities right and left Normal range of motion hip knee and ankle All 3 joints are reduced and stable  Strength right lower extremity L2-S1 NORMAL  Strength left lower extremity L2-S1 NORMAL  Neurologic right lower extremity examination  Reflexes were 2+ and equal at the knee and 1+ and equal at the ankle    Sensation was normal in both feet and legs    Babinski's tests were down going  Straight leg raise testing normal  The vascular examination revealed normal dorsalis pedis pulses in both feet and both feet were warm with good capillary refill    MEDICAL DECISION MAKING  A.  Encounter Diagnosis  Name Primary?   Chronic radicular lumbar pain Yes    B. DATA ANALYSED:    IMAGING: Independent interpretation of images: Imaging of the hip shows no fracture dislocation or significant degenerative changes she may have some mild arthritis  She has degenerative disc disease lumbar spine with slight anterolisthesis of L4 on 5  Orders: Physical therapy  Outside records reviewed: Just brief cursory review of her primary care notes  C. MANAGEMENT nonoperative care  No orders of the defined types were placed in this encounter.

## 2022-06-13 NOTE — Patient Instructions (Addendum)
STOP IBUPROFEN   TAKE TYLENOL 500 MG EVERY 6 HRS AND MELOXICAM ONCE A DAY FOR PAIN   Physical therapy has been ordered for you at [facility]. They should call you to schedule, [phone] is the phone number to call, if you want to call to schedule.

## 2022-06-14 ENCOUNTER — Ambulatory Visit (HOSPITAL_COMMUNITY): Payer: Medicare Other | Attending: Orthopedic Surgery | Admitting: Physical Therapy

## 2022-06-14 DIAGNOSIS — M5416 Radiculopathy, lumbar region: Secondary | ICD-10-CM | POA: Diagnosis not present

## 2022-06-14 DIAGNOSIS — M5459 Other low back pain: Secondary | ICD-10-CM | POA: Diagnosis not present

## 2022-06-14 DIAGNOSIS — G8929 Other chronic pain: Secondary | ICD-10-CM | POA: Insufficient documentation

## 2022-06-14 NOTE — Therapy (Signed)
OUTPATIENT PHYSICAL THERAPY THORACOLUMBAR EVALUATION   Patient Name: Joanna Brown MRN: [id number] DOB:08-26-1940, 82 y.o., female Today's Date: 06/14/2022  END OF SESSION:  PT End of Session - 06/14/22 1155     Visit Number 1    Number of Visits 2    Date for PT Re-Evaluation 07/01/22    Authorization Type Medicare    PT Start Time 1115    PT Stop Time 1157    PT Time Calculation (min) 42 min    Activity Tolerance Patient tolerated treatment well    Behavior During Therapy WFL for tasks assessed/performed             Past Medical History:  Diagnosis Date   Anginal pain ([place])    Arthritis    OA   Diabetes mellitus    Dyspnea    Female bladder prolapse    GERD (gastroesophageal reflux disease)    Glaucoma    Hyperlipidemia    Hypertension    echo and stress 4/10 reports on chart, EKG ` LOV 9/12 on chart   S/P TAVR (transcatheter aortic valve replacement) 03/23/2021   Edwards 26m S3U TF approach with Dr. MAngelena Formand Dr. BCyndia Bent  Severe aortic stenosis    Thyroid disease    Past Surgical History:  Procedure Laterality Date   ABDOMINAL HYSTERECTOMY     ANTERIOR AND POSTERIOR REPAIR  04/26/2011   Procedure: ANTERIOR (CYSTOCELE) AND POSTERIOR REPAIR (RECTOCELE);  Surgeon: SReece Packer MD;  [facility];  Service: Urology;  Laterality: N/A;   BIOPSY  05/25/2020   Procedure: BIOPSY;  Surgeon: DDoran Stabler MD;  [facility];  Service: Gastroenterology;;   CATARACT EXTRACTION Bilateral    with IOL   CHOLECYSTECTOMY  2009   COLONOSCOPY N/A 12/02/2013   three colon polyps removed, small internal hemorrhoids. Hyperplastic polyps   COLONOSCOPY N/A 11/20/2019   pancolonic diverticulosis, two 10-11 mm polyps in ascending colon, one 5 mm polyp in cecum, ascending colon with superficially invasive adenocarcinoma arising in background of sessile serrated polyps with low and high grade cytologic dysplasia.   COLONOSCOPY     COLONOSCOPY W/  POLYPECTOMY     COLONOSCOPY WITH PROPOFOL N/A 05/25/2020   diverticulosis in right colon, redundant colon. Caution warranted on future colonoscopy in light of age, cardiac condition, challenging anatomy.    DILATION AND CURETTAGE OF UTERUS     pt states this was in the 1970's   ESOPHAGOGASTRODUODENOSCOPY (EGD) WITH PROPOFOL N/A 05/25/2020   Grade 1 esophageal varices, single mucosal nodule in stomach s/p biopsy. (hyperplastic)>    LEFT HEART CATH  09/10/2008   normal coronary arteries, normal LV systolic function, EF 684%(Dr. HNorlene Duel   [place]   under arm   NM MYOCAR PERF WALL MOTION  2010   dipyridamole - mild-mod in intenstiy perfusion defect in mid anterior, mid anteroseptal wall, EF 70%   OVARY SURGERY     bilateral tumors removed   POLYPECTOMY  11/20/2019   Procedure: POLYPECTOMY;  Surgeon: RDaneil Dolin MD;  [facility];  Service: Endoscopy;;  hot and cold snare cecal polyp, and asending polyps x 2   RIGHT/LEFT HEART CATH AND CORONARY ANGIOGRAPHY N/A 10/02/2020   Procedure: RIGHT/LEFT HEART CATH AND CORONARY ANGIOGRAPHY;  Surgeon: JMartinique Peter M, MD;  [facility];  Service: Cardiovascular;  Laterality: N/A;   THYROIDECTOMY     THYROIDECTOMY  02/2008   TRANSCATHETER AORTIC VALVE REPLACEMENT,  TRANSFEMORAL N/A 03/23/2021   Procedure: TRANSCATHETER AORTIC VALVE REPLACEMENT, TRANSFEMORAL;  Surgeon: Burnell Blanks, MD;  Location: [place] [facility];  Service: Open Heart Surgery;  Laterality: N/A;   TRANSTHORACIC ECHOCARDIOGRAM  08/2011   EF=>55%, mild conc LVH; trace MR; mild TR; mild-mod AV calcification with mild valvular AV stenosis   VAGINAL PROLAPSE REPAIR  04/26/2011   Procedure: VAGINAL VAULT SUSPENSION;  Surgeon: Reece Packer, MD;  [facility];  Service: Urology;  Laterality: N/A;  with Graft  10x6   Patient Active Problem List   Diagnosis Date Noted   Breast pain, left 05/03/2022   Stroke  (cerebrum) ([place]) 12/28/2021   New onset of headaches 12/11/2021   Stage 3a chronic kidney disease (CKD) ([place]) 12/11/2021   PVD (peripheral vascular disease) ([place]) 08/03/2021   S/P TAVR (transcatheter aortic valve replacement) 03/23/2021   Hepatic cirrhosis ([place]) 08/05/2020   History of colon cancer 03/04/2020   GERD (gastroesophageal reflux disease) 03/04/2020   Nodular goiter 12/10/2019   Low back pain with left-sided sciatica 11/06/2019   Severe aortic stenosis 08/23/2013   Carotid bruit 06/11/2012   IRRITABLE BOWEL SYNDROME 08/24/2009   BILIARY DYSKINESIA 05/31/2009   Type 2 diabetes mellitus with stage 1 chronic kidney disease, with long-term current use of insulin ([place]) 08/06/2008   HLD (hyperlipidemia) 08/06/2008   Overweight (BMI 25.0-29.9) 08/06/2008   Essential hypertension 08/06/2008     PCP: Tula Nakayama MD  REFERRING PROVIDER: Carole Civil, MD  REFERRING DIAG: [phone] (ICD-10-CM) - Chronic radicular lumbar pain  Rationale for Evaluation and Treatment: Rehabilitation  THERAPY DIAG:  Other low back pain  ONSET DATE: 1 month ago (December 2023)  SUBJECTIVE:                                                                                                                                                                                           SUBJECTIVE STATEMENT: Patient presents to therapy with complaint of low back/ RT hip pain. This began about a month ago insidiously. She had recent imaging which showed some low level degeneration. She was given ibuprofen but can't take this due to she is diabetic. She found some exercises on her phone that he has found helpful as well as working to improve her posture. She feels her pain is much better now than it was last month.   PERTINENT HISTORY:  Heart disease, DM, arthritis, HTN  PAIN:  Are you having pain? No No pain at rest but 8/10 at worst "moving about"  PRECAUTIONS: None  WEIGHT BEARING  RESTRICTIONS: No  FALLS:  Has patient fallen in last 6 months? No  LIVING ENVIRONMENT: Lives with: lives with their spouse Lives in: House/apartment Stairs: No Has following equipment at home: Single point cane and Environmental consultant - 2 wheeled  OCCUPATION: Retired   PLOF: Independent with basic ADLs  PATIENT GOALS: Unsure   NEXT MD VISIT:   OBJECTIVE:   DIAGNOSTIC FINDINGS:  05/18/21 IMPRESSION: No recent fracture is seen in the lumbar spine. Moderate to marked degenerative changes are noted.   1.3 cm round opacity in right side of abdomen may suggest bowel content. Aortic atherosclerosis.  IMPRESSION: 1. No evidence of acute, displaced fracture or dislocation of the right hip. 2. Chronic fracture deformities of the left pubic bones. 3. Mild right femoroacetabular arthrosis.  PATIENT SURVEYS:  FOTO 59% function   COGNITION: Overall cognitive status: Within functional limits for tasks assessed    PALPATION: Mod TTP about RT posterior hip   LUMBAR ROM:   AROM eval  Flexion Full  Extension 75% limited  Right lateral flexion 50% limited  Left lateral flexion 50% limited  Right rotation   Left rotation    (Blank rows = not tested)  LOWER EXTREMITY MMT:    MMT Right eval Left eval  Hip flexion 5 4  Hip extension 4 4  Hip abduction 4 4  Hip adduction    Hip internal rotation    Hip external rotation    Knee flexion    Knee extension 5 5  Ankle dorsiflexion 5 5  Ankle plantarflexion    Ankle inversion    Ankle eversion     (Blank rows = not tested)  TODAY'S TREATMENT:                                                                                                                              DATE:  06/14/22 Eval     PATIENT EDUCATION:  Education details: on Eval findings, POC and HEP  Person educated: Patient Education method: Explanation Education comprehension: verbalized understanding  HOME EXERCISE PROGRAM: Access Code: [email or web address] URL:  [email or web address] Date: 06/14/2022 Prepared by: Josue Hector  Exercises - Supine Bridge  - 2 x daily - 5-7 x weekly - 1-2 sets - 10 reps - 3 second hold - Supine Lower Trunk Rotation  - 2 x daily - 5-7 x weekly - 1 sets - 10 reps - 5 second hold - Supine Piriformis Stretch with Foot on Ground  - 2 x daily - 5-7 x weekly - 1 sets - 4 reps - 20 second hold  ASSESSMENT:  CLINICAL IMPRESSION: Patient is a 82 y.o. female who presents to physical therapy with complaint of LBP. Patient demonstrates mild muscle weakness, reduced ROM, and fascial restrictions which are likely contributing to symptoms of pain and are negatively impacting patient ability to perform ADLs and functional mobility tasks. Patient will benefit from skilled physical therapy services to address these deficits to reduce pain and improve level of function with ADLs and functional mobility tasks.   OBJECTIVE IMPAIRMENTS: decreased  activity tolerance, decreased mobility, decreased ROM, decreased strength, impaired flexibility, improper body mechanics, and pain.   ACTIVITY LIMITATIONS: carrying, lifting, bending, standing, squatting, and locomotion level  PARTICIPATION LIMITATIONS: meal prep, cleaning, laundry, shopping, community activity, and yard work  PERSONAL FACTORS:  None  are also affecting patient's functional outcome.   REHAB POTENTIAL: Excellent  CLINICAL DECISION MAKING: Stable/uncomplicated  EVALUATION COMPLEXITY: Low   GOALS: SHORT TERM GOALS: Target date: 06/28/2022  Patient will be independent with initial HEP and self-management strategies to improve functional outcomes Baseline:  Goal status: INITIAL   PLAN:  PT FREQUENCY: 1x/week  PT DURATION: 1 week  PLANNED INTERVENTIONS: Therapeutic exercises, Therapeutic activity, Neuromuscular re-education, Balance training, Gait training, Patient/Family education, Joint manipulation, Joint mobilization, Stair training, Aquatic Therapy,  Dry Needling, Electrical stimulation, Spinal manipulation, Spinal mobilization, Cryotherapy, Moist heat, scar mobilization, Taping, Traction, Ultrasound, Biofeedback, Ionotophoresis '4mg'$ /ml Dexamethasone, and Manual therapy. Marland Kitchen  PLAN FOR NEXT SESSION: 1 x follow up for additional HEP exercise. DC when feeling confident with this.   11:56 AM, 06/14/22 Josue Hector PT DPT  Physical Therapist with [facility]  [phone]

## 2022-06-15 DIAGNOSIS — R0602 Shortness of breath: Secondary | ICD-10-CM | POA: Diagnosis not present

## 2022-06-15 DIAGNOSIS — Z03818 Encounter for observation for suspected exposure to other biological agents ruled out: Secondary | ICD-10-CM | POA: Diagnosis not present

## 2022-06-15 DIAGNOSIS — R531 Weakness: Secondary | ICD-10-CM | POA: Diagnosis not present

## 2022-06-15 DIAGNOSIS — R051 Acute cough: Secondary | ICD-10-CM | POA: Diagnosis not present

## 2022-06-20 DIAGNOSIS — N181 Chronic kidney disease, stage 1: Secondary | ICD-10-CM | POA: Diagnosis not present

## 2022-06-20 DIAGNOSIS — E1122 Type 2 diabetes mellitus with diabetic chronic kidney disease: Secondary | ICD-10-CM | POA: Diagnosis not present

## 2022-06-20 DIAGNOSIS — Z794 Long term (current) use of insulin: Secondary | ICD-10-CM | POA: Diagnosis not present

## 2022-06-21 LAB — LIPID PANEL
Chol/HDL Ratio: 4.9 ratio — ABNORMAL HIGH (ref 0.0–4.4)
Cholesterol, Total: 194 mg/dL (ref 100–199)
HDL: 40 mg/dL (ref >39)
LDL Chol Calc (NIH): 118 mg/dL — ABNORMAL HIGH (ref 0–99)
Triglycerides: 205 mg/dL — ABNORMAL HIGH (ref 0–149)
VLDL Cholesterol Cal: 36 mg/dL (ref 5–40)

## 2022-06-21 LAB — COMPREHENSIVE METABOLIC PANEL
ALT: 18 IU/L (ref 0–32)
AST: 19 IU/L (ref 0–40)
Albumin/Globulin Ratio: 1.5 (ref 1.2–2.2)
Albumin: 4.1 g/dL (ref 3.8–4.8)
Alkaline Phosphatase: 85 IU/L (ref 44–121)
BUN/Creatinine Ratio: 15 (ref 12–28)
BUN: 15 mg/dL (ref 8–27)
Bilirubin Total: 0.2 mg/dL (ref 0.0–1.2)
CO2: 23 mmol/L (ref 20–29)
Calcium: 9.4 mg/dL (ref 8.7–10.3)
Chloride: 103 mmol/L (ref 96–106)
Creatinine, Ser: 0.99 mg/dL (ref 0.57–1.00)
Globulin, Total: 2.7 g/dL (ref 1.5–4.5)
Glucose: 199 mg/dL — ABNORMAL HIGH (ref 70–99)
Potassium: 3.9 mmol/L (ref 3.5–5.2)
Sodium: 141 mmol/L (ref 134–144)
Total Protein: 6.8 g/dL (ref 6.0–8.5)
eGFR: 58 mL/min/{1.73_m2} — ABNORMAL LOW (ref >59)

## 2022-06-21 LAB — T4, FREE: Free T4: 1.23 ng/dL (ref 0.82–1.77)

## 2022-06-21 LAB — TSH: TSH: 2.89 u[IU]/mL (ref 0.450–4.500)

## 2022-06-28 ENCOUNTER — Encounter: Payer: Self-pay | Admitting: Family Medicine

## 2022-06-28 ENCOUNTER — Ambulatory Visit (INDEPENDENT_AMBULATORY_CARE_PROVIDER_SITE_OTHER): Payer: Medicare Other | Admitting: Family Medicine

## 2022-06-28 VITALS — BP 160/78 | HR 78 | Ht 65.0 in | Wt 181.0 lb

## 2022-06-28 DIAGNOSIS — Z794 Long term (current) use of insulin: Secondary | ICD-10-CM | POA: Diagnosis not present

## 2022-06-28 DIAGNOSIS — I10 Essential (primary) hypertension: Secondary | ICD-10-CM

## 2022-06-28 DIAGNOSIS — E785 Hyperlipidemia, unspecified: Secondary | ICD-10-CM

## 2022-06-28 DIAGNOSIS — N181 Chronic kidney disease, stage 1: Secondary | ICD-10-CM | POA: Diagnosis not present

## 2022-06-28 DIAGNOSIS — M65331 Trigger finger, right middle finger: Secondary | ICD-10-CM | POA: Diagnosis not present

## 2022-06-28 DIAGNOSIS — E1122 Type 2 diabetes mellitus with diabetic chronic kidney disease: Secondary | ICD-10-CM | POA: Diagnosis not present

## 2022-06-28 DIAGNOSIS — E663 Overweight: Secondary | ICD-10-CM

## 2022-06-28 MED ORDER — CLOPIDOGREL BISULFATE 75 MG PO TABS: 75.0000 mg | ORAL_TABLET | Freq: Every day | ORAL | 3 refills | Status: DC

## 2022-06-28 MED ORDER — CARVEDILOL 6.25 MG PO TABS: 6.2500 mg | ORAL_TABLET | Freq: Two times a day (BID) | ORAL | 4 refills | Status: DC

## 2022-06-28 MED ORDER — FENOFIBRATE 145 MG PO TABS: 145.0000 mg | ORAL_TABLET | Freq: Every day | ORAL | 5 refills | Status: DC

## 2022-06-28 NOTE — Patient Instructions (Addendum)
Follow-up in 8 weeks to reevaluate blood pressure, and foot exam, call if you need me sooner.  Blood pressure is improved, but not yet at goal  New higher dose of carvedilol starting today 6.25 mg 1 tablet 2 times daily.  Stop carvedilol 3.125 mg tablet.  New medication for cholesterol and triglycerides is sent in.  This is fenofibrate 145 mg once daily.  Continue pravastatin 80 mg once daily for cholesterol also.  You are now on those 2 tablets for your cholesterol.  Ezetimibe is discontinued for your cholesterol so no more of this.  Please get your eye exam this is past due.  It is important that you exercise regularly at least 30 minutes 5 times a week. If you develop chest pain, have severe difficulty breathing, or feel very tired, stop exercising immediately and seek medical attention   Reduce salt and fried and fatty foods  Thanks for choosing [facility], we consider it a privelige to serve you.

## 2022-06-28 NOTE — Progress Notes (Unsigned)
Joanna Brown     MRN: [id number]      DOB: December 09, 1940   HPI Joanna Brown is here for follow up and re-evaluation of chronic medical conditions, medication management and review of any available recent lab and radiology data.  Preventive health is updated, specifically  Cancer screening and Immunization.   Questions or concerns regarding consultations or procedures which the PT has had in the interim are  addressed.Recent Nephrology visit is reviewed The PT denies any adverse reactions to current medications since the last visit.  C/o  triggering of right middle finger x 6 weeks, worsening   ROS Denies recent fever or chills. Denies sinus pressure, nasal congestion, ear pain or sore throat. Denies chest congestion, productive cough or wheezing. Denies chest pains, palpitations and leg swelling Denies abdominal pain, nausea, vomiting,diarrhea or constipation.   Denies dysuria, frequency, hesitancy or incontinence.  Denies headaches, seizures, numbness, or tingling. Denies depression, anxiety or insomnia. Denies skin break down or rash.   PE  BP (!) 160/78 (BP Location: Left Arm, Patient Position: Sitting, Cuff Size: Normal)   Pulse 78   Ht 5' 5"$  (1.651 m)   Wt 181 lb (82.1 kg)   SpO2 96%   BMI 30.12 kg/m   Patient alert and oriented and in no cardiopulmonary distress.  HEENT: No facial asymmetry, EOMI,     Neck supple .  Chest: Clear to auscultation bilaterally.  CVS: S1, S2 murmurs no S3.Regular rate.  ABD: Soft non tender.   Ext: No edema  MS: Adequate ROM spine, shoulders, hips and knees.trigger finger right middle  Skin: Intact, no ulcerations or rash noted.  Psych: Good eye contact, normal affect. Memory intact not anxious or depressed appearing.  CNS: CN 2-12 intact, power,  normal throughout.no focal deficits noted.   Assessment & Plan  Trigger finger, right middle finger Worsening, also difficulty closing hand, refer Ortho  Essential  hypertension Uncontrolled , increase dose coreg DASH diet and commitment to daily physical activity for a minimum of 30 minutes discussed and encouraged, as a part of hypertension management. The importance of attaining a healthy weight is also discussed.     06/28/2022    9:15 AM 06/28/2022    8:40 AM 06/28/2022    8:37 AM 06/13/2022    8:20 AM 05/18/2022    4:09 PM 05/03/2022    9:00 AM 05/03/2022    8:57 AM  BP/Weight  Systolic BP 0000000 0000000 [id number]  Diastolic BP 78 78 77 82 83 66 76  Wt. (Lbs)   181 180.6 185  181.08  BMI   30.12 kg/m2 30.05 kg/m2 30.79 kg/m2  30.13 kg/m2       Type 2 diabetes mellitus with stage 1 chronic kidney disease, with long-term current use of insulin ([place]) Joanna Brown is reminded of the importance of commitment to daily physical activity for 30 minutes or more, as able and the need to limit carbohydrate intake to 30 to 60 grams per meal to help with blood sugar control.   The need to take medication as prescribed, test blood sugar as directed, and to call between visits if there is a concern that blood sugar is uncontrolled is also discussed.   Joanna Brown is reminded of the importance of daily foot exam, annual eye examination, and good blood sugar, blood pressure and cholesterol control. Managed by endo, well controlled     Latest Ref Rng & Units 06/20/2022    8:30 AM 04/25/2022  8:36 AM 12/29/2021    4:43 AM 12/28/2021    4:24 PM 12/14/2021    8:12 AM  Diabetic Labs  Chol 100 - 199 mg/dL 194  144  142   159   HDL >39 mg/dL 40  42  34   35   Calc LDL 0 - 99 mg/dL 118  77  59   90   Triglycerides 0 - 149 mg/dL 205  141  243   197   Creatinine 0.57 - 1.00 mg/dL 0.99  1.08   1.27  1.04       06/28/2022    9:15 AM 06/28/2022    8:40 AM 06/28/2022    8:37 AM 06/13/2022    8:20 AM 05/18/2022    4:09 PM 05/03/2022    9:00 AM 05/03/2022    8:57 AM  BP/Weight  Systolic BP 0000000 0000000 [id number]  Diastolic BP 78 78 77 82 83 66 76  Wt.  (Lbs)   181 180.6 185  181.08  BMI   30.12 kg/m2 30.05 kg/m2 30.79 kg/m2  30.13 kg/m2      Latest Ref Rng & Units 08/03/2021    9:20 AM 03/09/2021   12:00 AM  Foot/eye exam completion dates  Eye Exam No Retinopathy  No Retinopathy      Foot Form Completion  Done      This result is from an external source.        Hyperlipidemia LDL goal <70 Hyperlipidemia:Low fat diet discussed and encouraged.   Lipid Panel  Lab Results  Component Value Date   CHOL 194 06/20/2022   HDL 40 06/20/2022   LDLCALC 118 (H) 06/20/2022   TRIG 205 (H) 06/20/2022   CHOLHDL 4.9 (H) 06/20/2022     Add fenofibrate, reduce fried and fatty foods  Overweight (BMI 25.0-29.9)  Patient re-educated about  the importance of commitment to a  minimum of 150 minutes of exercise per week as able.  The importance of healthy food choices with portion control discussed, as well as eating regularly and within a 12 hour window most days. The need to choose "clean , green" food 50 to 75% of the time is discussed, as well as to make water the primary drink and set a goal of 64 ounces water daily.       06/28/2022    8:37 AM 06/13/2022    8:20 AM 05/18/2022    4:09 PM  Weight /BMI  Weight 181 lb 180 lb 9.6 oz 185 lb  Height 5' 5"$  (1.651 m) 5' 5"$  (1.651 m) 5' 5"$  (1.651 m)  BMI 30.12 kg/m2 30.05 kg/m2 30.79 kg/m2

## 2022-06-28 NOTE — Assessment & Plan Note (Signed)
Worsening, also difficulty closing hand, refer Ortho

## 2022-06-30 ENCOUNTER — Encounter: Payer: Self-pay | Admitting: Family Medicine

## 2022-06-30 NOTE — Assessment & Plan Note (Signed)
Hyperlipidemia:Low fat diet discussed and encouraged.   Lipid Panel  Lab Results  Component Value Date   CHOL 194 06/20/2022   HDL 40 06/20/2022   LDLCALC 118 (H) 06/20/2022   TRIG 205 (H) 06/20/2022   CHOLHDL 4.9 (H) 06/20/2022     Add fenofibrate, reduce fried and fatty foods

## 2022-06-30 NOTE — Assessment & Plan Note (Signed)
  Patient re-educated about  the importance of commitment to a  minimum of 150 minutes of exercise per week as able.  The importance of healthy food choices with portion control discussed, as well as eating regularly and within a 12 hour window most days. The need to choose "clean , green" food 50 to 75% of the time is discussed, as well as to make water the primary drink and set a goal of 64 ounces water daily.       06/28/2022    8:37 AM 06/13/2022    8:20 AM 05/18/2022    4:09 PM  Weight /BMI  Weight 181 lb 180 lb 9.6 oz 185 lb  Height '5\' 5"'$  (1.651 m) '5\' 5"'$  (1.651 m) '5\' 5"'$  (1.651 m)  BMI 30.12 kg/m2 30.05 kg/m2 30.79 kg/m2

## 2022-06-30 NOTE — Assessment & Plan Note (Addendum)
Joanna Brown is reminded of the importance of commitment to daily physical activity for 30 minutes or more, as able and the need to limit carbohydrate intake to 30 to 60 grams per meal to help with blood sugar control.   The need to take medication as prescribed, test blood sugar as directed, and to call between visits if there is a concern that blood sugar is uncontrolled is also discussed.   Joanna Brown is reminded of the importance of daily foot exam, annual eye examination, and good blood sugar, blood pressure and cholesterol control. Managed by endo, well controlled     Latest Ref Rng & Units 06/20/2022    8:30 AM 04/25/2022    8:36 AM 12/29/2021    4:43 AM 12/28/2021    4:24 PM 12/14/2021    8:12 AM  Diabetic Labs  Chol 100 - 199 mg/dL 194  144  142   159   HDL >39 mg/dL 40  42  34   35   Calc LDL 0 - 99 mg/dL 118  77  59   90   Triglycerides 0 - 149 mg/dL 205  141  243   197   Creatinine 0.57 - 1.00 mg/dL 0.99  1.08   1.27  1.04       06/28/2022    9:15 AM 06/28/2022    8:40 AM 06/28/2022    8:37 AM 06/13/2022    8:20 AM 05/18/2022    4:09 PM 05/03/2022    9:00 AM 05/03/2022    8:57 AM  BP/Weight  Systolic BP 0000000 0000000 [id number]  Diastolic BP 78 78 77 82 83 66 76  Wt. (Lbs)   181 180.6 185  181.08  BMI   30.12 kg/m2 30.05 kg/m2 30.79 kg/m2  30.13 kg/m2      Latest Ref Rng & Units 08/03/2021    9:20 AM 03/09/2021   12:00 AM  Foot/eye exam completion dates  Eye Exam No Retinopathy  No Retinopathy      Foot Form Completion  Done      This result is from an external source.

## 2022-06-30 NOTE — Assessment & Plan Note (Signed)
Uncontrolled , increase dose coreg DASH diet and commitment to daily physical activity for a minimum of 30 minutes discussed and encouraged, as a part of hypertension management. The importance of attaining a healthy weight is also discussed.     06/28/2022    9:15 AM 06/28/2022    8:40 AM 06/28/2022    8:37 AM 06/13/2022    8:20 AM 05/18/2022    4:09 PM 05/03/2022    9:00 AM 05/03/2022    8:57 AM  BP/Weight  Systolic BP 0000000 0000000 [id number]  Diastolic BP 78 78 77 82 83 66 76  Wt. (Lbs)   181 180.6 185  181.08  BMI   30.12 kg/m2 30.05 kg/m2 30.79 kg/m2  30.13 kg/m2

## 2022-07-03 ENCOUNTER — Other Ambulatory Visit: Payer: Self-pay | Admitting: "Endocrinology

## 2022-07-05 DIAGNOSIS — E611 Iron deficiency: Secondary | ICD-10-CM | POA: Diagnosis not present

## 2022-07-05 DIAGNOSIS — I129 Hypertensive chronic kidney disease with stage 1 through stage 4 chronic kidney disease, or unspecified chronic kidney disease: Secondary | ICD-10-CM | POA: Diagnosis not present

## 2022-07-05 DIAGNOSIS — N189 Chronic kidney disease, unspecified: Secondary | ICD-10-CM | POA: Diagnosis not present

## 2022-07-05 DIAGNOSIS — I5032 Chronic diastolic (congestive) heart failure: Secondary | ICD-10-CM | POA: Diagnosis not present

## 2022-07-05 DIAGNOSIS — E876 Hypokalemia: Secondary | ICD-10-CM | POA: Diagnosis not present

## 2022-07-05 DIAGNOSIS — E1122 Type 2 diabetes mellitus with diabetic chronic kidney disease: Secondary | ICD-10-CM | POA: Diagnosis not present

## 2022-07-05 DIAGNOSIS — Z6830 Body mass index (BMI) 30.0-30.9, adult: Secondary | ICD-10-CM | POA: Diagnosis not present

## 2022-07-05 DIAGNOSIS — R768 Other specified abnormal immunological findings in serum: Secondary | ICD-10-CM | POA: Diagnosis not present

## 2022-07-07 ENCOUNTER — Ambulatory Visit (INDEPENDENT_AMBULATORY_CARE_PROVIDER_SITE_OTHER): Payer: Medicare Other | Admitting: Orthopedic Surgery

## 2022-07-07 ENCOUNTER — Encounter: Payer: Self-pay | Admitting: Orthopedic Surgery

## 2022-07-07 VITALS — BP 160/78 | Ht 65.0 in | Wt 181.0 lb

## 2022-07-07 DIAGNOSIS — M65342 Trigger finger, left ring finger: Secondary | ICD-10-CM

## 2022-07-07 DIAGNOSIS — M65331 Trigger finger, right middle finger: Secondary | ICD-10-CM

## 2022-07-07 MED ORDER — METHYLPREDNISOLONE ACETATE 40 MG/ML IJ SUSP
40.0000 mg | Freq: Once | INTRAMUSCULAR | Status: AC
  Administered 2022-07-07: 40 mg via INTRA_ARTICULAR

## 2022-07-07 NOTE — Progress Notes (Signed)
NEW PROBLEM   Joanna Brown is 82 years old she presents with 3-week history of pain over the A1 pulley of the right long/middle finger with locking and catching  Exam of the right long finger shows the finger is locked we were able to unlock it with manual manipulation she was tender over the A1 pulley neurovascular exam is intact flexor tendons normal  Recommend injection A1 pulley right long finger  Trigger finger injection  Diagnosis tenosynovitis right long finger/middle finger Procedure injection A1 pulley Medications lidocaine 1% 1 mL and Depo-Medrol 40 mg 1 mL Skin prep alcohol and ethyl chloride Verbal consent was obtained Timeout confirmed the injection site  After cleaning the skin with alcohol and anesthetizing the skin with ethyl chloride the A1 pulley was palpated and the injection was performed without complication   Return 2 weeks check to see if we need another injection

## 2022-07-13 ENCOUNTER — Encounter: Payer: Self-pay | Admitting: Podiatry

## 2022-07-13 ENCOUNTER — Ambulatory Visit (INDEPENDENT_AMBULATORY_CARE_PROVIDER_SITE_OTHER): Payer: Medicare Other | Admitting: Podiatry

## 2022-07-13 VITALS — BP 153/71

## 2022-07-13 DIAGNOSIS — B351 Tinea unguium: Secondary | ICD-10-CM

## 2022-07-13 DIAGNOSIS — M79674 Pain in right toe(s): Secondary | ICD-10-CM | POA: Diagnosis not present

## 2022-07-13 DIAGNOSIS — E1142 Type 2 diabetes mellitus with diabetic polyneuropathy: Secondary | ICD-10-CM

## 2022-07-13 DIAGNOSIS — M79675 Pain in left toe(s): Secondary | ICD-10-CM | POA: Diagnosis not present

## 2022-07-13 NOTE — Progress Notes (Unsigned)
  Subjective:  Patient ID: Joanna Brown, female    DOB: October 16, 1940,  MRN: [id number]  Derinda Late Hoctor presents to clinic today for {jgcomplaint:23593}  Chief Complaint  Patient presents with   Nail Problem    DFC BS-105 A1C-7.2 PCP-Simpson, Margaret PCP VST-2 weeks ago    New problem(s): None. {jgcomplaint:23593}  PCP is Fayrene Helper, MD.  Allergies  Allergen Reactions   Senokot Wheat Bran [Wheat Bran]     ABDOMINAL CRAMPS   Spironolactone     Stomach problems, vision changes     Review of Systems: Negative except as noted in the HPI.  Objective: No changes noted in today's physical examination. Vitals:   07/13/22 0909  BP: (!) 153/71    Joanna Brown is a pleasant 82 y.o. female {jgbodyhabitus:24098} AAO x 3.  Vascular Examination: Vascular status intact b/l with palpable pedal pulses. CFT immediate b/l. No edema. No pain with calf compression b/l. Skin temperature gradient WNL b/l. No ischemia or gangrene noted b/l LE. No cyanosis or clubbing noted b/l LE.  Neurological Examination: Sensation grossly intact b/l with 10 gram monofilament. Vibratory sensation intact b/l. Protective sensation intact 5/5 intact bilaterally with 10g monofilament b/l.  Dermatological Examination: Pedal skin with normal turgor, texture and tone b/l. Toenails 1-5 b/l thick, discolored, elongated with subungual debris and pain on dorsal palpation. No hyperkeratotic lesions noted b/l.   Musculoskeletal Examination: Muscle strength 5/5 to b/l LE. HAV with bunion deformity noted b/l LE. Patient ambulates independent of any assistive aids.  Assessment/Plan: 1. Pain due to onychomycosis of toenails of both feet   2. Diabetic peripheral neuropathy associated with type 2 diabetes mellitus ([facility])     {Jgplan:23602::"-Patient/POA to call should there be question/concern in the interim."}   Return in about 3 months (around 10/11/2022).  Marzetta Board, DPM

## 2022-07-18 DIAGNOSIS — E876 Hypokalemia: Secondary | ICD-10-CM | POA: Diagnosis not present

## 2022-07-18 DIAGNOSIS — I5032 Chronic diastolic (congestive) heart failure: Secondary | ICD-10-CM | POA: Diagnosis not present

## 2022-07-18 DIAGNOSIS — R809 Proteinuria, unspecified: Secondary | ICD-10-CM | POA: Diagnosis not present

## 2022-07-18 DIAGNOSIS — Z6830 Body mass index (BMI) 30.0-30.9, adult: Secondary | ICD-10-CM | POA: Diagnosis not present

## 2022-07-18 DIAGNOSIS — E1129 Type 2 diabetes mellitus with other diabetic kidney complication: Secondary | ICD-10-CM | POA: Diagnosis not present

## 2022-07-18 DIAGNOSIS — N189 Chronic kidney disease, unspecified: Secondary | ICD-10-CM | POA: Diagnosis not present

## 2022-07-18 DIAGNOSIS — E1122 Type 2 diabetes mellitus with diabetic chronic kidney disease: Secondary | ICD-10-CM | POA: Diagnosis not present

## 2022-07-18 DIAGNOSIS — I129 Hypertensive chronic kidney disease with stage 1 through stage 4 chronic kidney disease, or unspecified chronic kidney disease: Secondary | ICD-10-CM | POA: Diagnosis not present

## 2022-07-21 ENCOUNTER — Ambulatory Visit (INDEPENDENT_AMBULATORY_CARE_PROVIDER_SITE_OTHER): Payer: Medicare Other | Admitting: Gastroenterology

## 2022-07-21 ENCOUNTER — Ambulatory Visit (HOSPITAL_COMMUNITY)
Admission: RE | Admit: 2022-07-21 | Discharge: 2022-07-21 | Disposition: A | Discharge status: Home / Self Care | Payer: Medicare Other | Source: Ambulatory Visit | Attending: Gastroenterology | Admitting: Gastroenterology

## 2022-07-21 ENCOUNTER — Encounter: Payer: Self-pay | Admitting: Gastroenterology

## 2022-07-21 ENCOUNTER — Telehealth (INDEPENDENT_AMBULATORY_CARE_PROVIDER_SITE_OTHER): Payer: Self-pay | Admitting: Gastroenterology

## 2022-07-21 ENCOUNTER — Ambulatory Visit: Payer: Medicare Other | Admitting: Orthopedic Surgery

## 2022-07-21 VITALS — BP 136/78 | HR 73 | Temp 99.4°F | Ht 65.0 in | Wt 180.8 lb

## 2022-07-21 DIAGNOSIS — Z85038 Personal history of other malignant neoplasm of large intestine: Secondary | ICD-10-CM

## 2022-07-21 DIAGNOSIS — K5900 Constipation, unspecified: Secondary | ICD-10-CM | POA: Diagnosis not present

## 2022-07-21 DIAGNOSIS — K219 Gastro-esophageal reflux disease without esophagitis: Secondary | ICD-10-CM | POA: Diagnosis not present

## 2022-07-21 DIAGNOSIS — K7469 Other cirrhosis of liver: Secondary | ICD-10-CM

## 2022-07-21 MED ORDER — PANTOPRAZOLE SODIUM 40 MG PO TBEC: 40.0000 mg | DELAYED_RELEASE_TABLET | Freq: Every day | ORAL | 3 refills | Status: DC

## 2022-07-21 NOTE — Telephone Encounter (Signed)
RUQ [place] scheduled for Thursday 08/04/22 @ 10:15 am. NPO after midnight.  Left message to return call

## 2022-07-21 NOTE — Patient Instructions (Signed)
Today: you can have labs done and go to [facility] to do the abdominal xray. This will just help [place] see how much stool you have in your colon.  I will let you know the next step after doing the xray!!  Continue Linzess daily for now.  For reflux: I have sent in pantoprazole (Protonix) to take once each morning, 30 minutes before breakfast.  We are arranging an ultrasound of your liver just for routine purposes due to history of cirrhosis. We will do this every 6 months, just to keep a close check!  I will see you in 3 months!  I enjoyed seeing you again today! At our first visit, I mentioned how I value our relationship and want to provide genuine, compassionate, and quality care. You may receive a survey regarding your visit with me, and I welcome your feedback! Thanks so much for taking the time to complete this. I look forward to seeing you again.   Annitta Needs, PhD, ANP-BC [facility]

## 2022-07-21 NOTE — Progress Notes (Signed)
Gastroenterology Office Note     Primary Care Physician:  Fayrene Helper, MD  Primary Gastroenterologist: Dr. Abbey Chatters    Chief Complaint   Chief Complaint  Patient presents with   Gastroesophageal Reflux    Patient here today for a follow up on her gerd and her constipation. Patient was on pantoprazole 40 mg once per day this was stopped at last hospitalization, she is taking Linzess 290 mcg daily, and Miralax every other night, but says she stopped the Miralax as she felt is was of little help.     History of Present Illness   Joanna Brown is an 82 y.o. female presenting today in follow-up with a history of superficial invasive adenocarcinoma arising in background of a sessile serrated polyp in ascending colon in July 2021, surveillance colonoscopy in Jan 2022 but caution  warranted for colonoscopy in future due to age and cardiac condition. Cirrhosis noted on imaging; Hep C screening 2014 negative. Hep A and B negative.  EGD 2022 also performed with Grade 1 esophageal varices.      Cirrhosis: previously desired ultrasound yearly instead of every 6 months. Now she would like every 6 months. Needs updated AFP. Recent CMP negative. [place] due now. EGD from 2022 with Grade 1 varices; high risk candidate for sedation at this time. No mental status changes or confusion.   Constipation: when having a BM, will be watery. No "logs" for about a week. About 3 weeks ago noted change in bowel habits. BM will be every 3-4 days and be a bit watery. Sometimes 2-3 small pieces. Linzess 290 mcg daily and Miralax every other night, but stopped taking this as felt she had too much in her gut. Feels bloated. No hematochezia. States stool can be dark. Last saw on Monday. States stool looks like coffee grounds. Hgb 13.3 recently. Just ordered more Linzess and wants to stick with this if she can.   GERD: has had belching and gas. Not on a PPI.   Possible TIA in Aug 2023. On Plavix solely now.    Past Medical History:  Diagnosis Date   Anginal pain ([place])    Arthritis    OA   Diabetes mellitus    Dyspnea    Female bladder prolapse    GERD (gastroesophageal reflux disease)    Glaucoma    Hyperlipidemia    Hypertension    echo and stress 4/10 reports on chart, EKG ` LOV 9/12 on chart   S/P TAVR (transcatheter aortic valve replacement) 03/23/2021   Edwards 53m S3U TF approach with Dr. MAngelena Formand Dr. BCyndia Bent  Severe aortic stenosis    Thyroid disease     Past Surgical History:  Procedure Laterality Date   ABDOMINAL HYSTERECTOMY     ANTERIOR AND POSTERIOR REPAIR  04/26/2011   Procedure: ANTERIOR (CYSTOCELE) AND POSTERIOR REPAIR (RECTOCELE);  Surgeon: SReece Packer MD;  Location: [facility];  Service: Urology;  Laterality: N/A;   BIOPSY  05/25/2020   Procedure: BIOPSY;  Surgeon: DDoran Stabler MD;  Location: [facility];  Service: Gastroenterology;;   CATARACT EXTRACTION Bilateral    with IOL   CHOLECYSTECTOMY  2009   COLONOSCOPY N/A 12/02/2013   three colon polyps removed, small internal hemorrhoids. Hyperplastic polyps   COLONOSCOPY N/A 11/20/2019   pancolonic diverticulosis, two 10-11 mm polyps in ascending colon, one 5 mm polyp in cecum, ascending colon with superficially invasive adenocarcinoma arising in background of sessile serrated polyps with low and high  grade cytologic dysplasia.   COLONOSCOPY     COLONOSCOPY W/ POLYPECTOMY     COLONOSCOPY WITH PROPOFOL N/A 05/25/2020   diverticulosis in right colon, redundant colon. Caution warranted on future colonoscopy in light of age, cardiac condition, challenging anatomy.    DILATION AND CURETTAGE OF UTERUS     pt states this was in the 1970's   ESOPHAGOGASTRODUODENOSCOPY (EGD) WITH PROPOFOL N/A 05/25/2020   Grade 1 esophageal varices, single mucosal nodule in stomach s/p biopsy. (hyperplastic)>    LEFT HEART CATH  09/10/2008   normal coronary arteries, normal LV systolic function, EF [id number] (Dr. [name])    [place] Right 1997   under arm   NM MYOCAR PERF WALL MOTION  2010   dipyridamole - mild-mod in intenstiy perfusion defect in mid anterior, mid anteroseptal wall, EF 70%   OVARY SURGERY     bilateral tumors removed   POLYPECTOMY  11/20/2019   Procedure: POLYPECTOMY;  Surgeon: Daneil Dolin, MD;  [facility]: [facility];  Service: Endoscopy;;  hot and cold snare cecal polyp, and asending polyps x 2   RIGHT/LEFT HEART CATH AND CORONARY ANGIOGRAPHY N/A 10/02/2020   Procedure: RIGHT/LEFT HEART CATH AND CORONARY ANGIOGRAPHY;  Surgeon: Martinique, Peter M, MD;  Location: [facility];  Service: Cardiovascular;  Laterality: N/A;   THYROIDECTOMY     THYROIDECTOMY  02/2008   TRANSCATHETER AORTIC VALVE REPLACEMENT, TRANSFEMORAL N/A 03/23/2021   Procedure: TRANSCATHETER AORTIC VALVE REPLACEMENT, TRANSFEMORAL;  Surgeon: Burnell Blanks, MD;  Location: [facility];  Service: Open Heart Surgery;  Laterality: N/A;   TRANSTHORACIC ECHOCARDIOGRAM  08/2011   EF=>55%, mild conc LVH; trace MR; mild TR; mild-mod AV calcification with mild valvular AV stenosis   VAGINAL PROLAPSE REPAIR  04/26/2011   Procedure: VAGINAL VAULT SUSPENSION;  Surgeon: Reece Packer, MD;  Location: [facility];  Service: Urology;  Laterality: N/A;  with Graft  10x6    Current Outpatient Medications  Medication Sig Dispense Refill   acetaminophen (TYLENOL) 500 MG tablet Take 500-1,000 mg by mouth every 6 (six) hours as needed (pain).     amLODipine (NORVASC) 10 MG tablet TAKE 1 TABLET(10 MG) BY MOUTH EVERY MORNING 90 tablet 3   Ascorbic Acid (VITAMIN C) 1000 MG tablet Take 1,000 mg by mouth 4 (four) times a week. AT NIGHT     benazepril (LOTENSIN) 40 MG tablet TAKE 1 TABLET(40 MG) BY MOUTH DAILY 90 tablet 1   Blood Glucose Monitoring Suppl (ACCU-CHEK GUIDE) w/Device KIT 1 each by Does not apply route 4 (four) times daily. 1 kit 0   carvedilol (COREG) 6.25 MG tablet Take 1 tablet (6.25 mg total) by  mouth 2 (two) times daily with a meal. 60 tablet 4   cholecalciferol (VITAMIN D3) 25 MCG (1000 UT) tablet Take 1,000 Units by mouth in the morning.     clopidogrel (PLAVIX) 75 MG tablet Take 1 tablet (75 mg total) by mouth daily. 90 tablet 3   dapagliflozin propanediol (FARXIGA) 5 MG TABS tablet Take 5 mg by mouth daily.     eplerenone (INSPRA) 25 MG tablet Take 12.5 mg by mouth daily.     ezetimibe (ZETIA) 10 MG tablet Take 10 mg by mouth daily.     glipiZIDE (GLUCOTROL XL) 5 MG 24 hr tablet TAKE 1 TABLET(5 MG) BY MOUTH DAILY WITH BREAKFAST 90 tablet 0   glucose blood (ACCU-CHEK GUIDE) test strip 1 each by Other route in the morning, at noon, in the  evening, and at bedtime. Use as instructed 4 x daily. E11.65 150 each 3   insulin isophane & regular human KwikPen (HUMULIN 70/30 KWIKPEN) (70-30) 100 UNIT/ML KwikPen INJECT 60 UNITS UNDER THE SKIN EVERY MORNING AND 40 UNITS EVERY NIGHT 90 mL 0   Insulin Pen Needle (B-D ULTRAFINE III SHORT PEN) 31G X 8 MM MISC USE WITH INSULIN TWICE DAILY AS DIRECTED 200 each 2   latanoprost (XALATAN) 0.005 % ophthalmic solution Place 1 drop into both eyes at bedtime.      linaclotide (LINZESS) 290 MCG CAPS capsule Take 1 capsule (290 mcg total) by mouth daily before breakfast. 90 capsule 3   metFORMIN (GLUCOPHAGE) 500 MG tablet Take 1 tablet (500 mg total) by mouth daily with breakfast. (Patient taking differently: Take 1,000 mg by mouth daily with breakfast.) 90 tablet 1   Multiple Vitamin (MULTIVITAMIN WITH MINERALS) TABS tablet Take 1 tablet by mouth daily.     Omega-3 Fatty Acids (FISH OIL) 1200 MG CAPS Take 1,200 mg by mouth every morning.     polyethylene glycol (MIRALAX) 17 g packet Take 17 g by mouth at bedtime. (Patient not taking: Reported on 07/21/2022) 14 each 0   No current facility-administered medications for this visit.    Allergies as of 07/21/2022 - Review Complete 07/21/2022  Allergen Reaction Noted   Senokot wheat bran [wheat]  01/18/2018    Spironolactone  03/24/2014    Family History  Problem Relation Age of Onset   Stomach cancer Mother 74   Heart disease Mother 59       heart disease   Heart disease Father 29       MI   Stroke Maternal Grandfather    Heart attack Paternal Grandfather    Hypertension Brother    Bone cancer Brother    Hypertension Sister    Liver cancer Sister    Hypertension Sister    Hypertension Child    Colon cancer Neg Hx    Colon polyps Neg Hx    Pancreatic cancer Neg Hx    Esophageal cancer Neg Hx    Rectal cancer Neg Hx     Social History   Socioeconomic History   Marital status: Married    Spouse name: [name]   Number of children: 1   Years of education: Trade   Highest education level: 12th grade  Occupational History   Occupation: Retired   Occupation: Writer  Tobacco Use   Smoking status: Never   Smokeless tobacco: Never  Scientific laboratory technician Use: Never used  Substance and Sexual Activity   Alcohol use: Not Currently   Drug use: No   Sexual activity: Yes  Other Topics Concern   Not on file  Social History Narrative   Patient lives at home with spouse. 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 LIVED IN [place] FOR 50 YRS AND CAME BACK TO [place] ~2009. Caffeine Use: 1 cup of coffee daily. HAD ONE CHILD: PASSED 5 YRS. HAVE THREE GRAND-KIDS AND TWO GREAT GRANDS.    Social Determinants of Health   Financial Resource Strain: Low Risk  (08/02/2021)   Overall Financial Resource Strain (CARDIA)    Difficulty of Paying Living Expenses: Not very hard  Food Insecurity: No Food Insecurity (08/02/2021)   Hunger Vital Sign    Worried About Running Out of Food in the Last Year: Never true    Ran Out of Food in the Last Year: Never true  Transportation Needs: No  Transportation Needs (08/02/2021)   PRAPARE - Hydrologist (Medical): No    Lack of Transportation (Non-Medical): No  Physical Activity: Insufficiently Active  (08/02/2021)   Exercise Vital Sign    Days of Exercise per Week: 7 days    Minutes of Exercise per Session: 10 min  Stress: No Stress Concern Present (08/02/2021)   [place]    Feeling of Stress : Not at all  Social Connections: [place] (08/02/2021)   Social Connection and Isolation Panel [NHANES]    Frequency of Communication with Friends and Family: More than three times a week    Frequency of Social Gatherings with Friends and Family: Once a week    Attends Religious Services: More than 4 times per year    Active Member of [employer] or Organizations: Yes    Attends Archivist Meetings: 1 to 4 times per year    Marital Status: Married  Human resources officer Violence: Not At Risk (08/02/2021)   Humiliation, Afraid, Rape, and Kick questionnaire    Fear of Current or Ex-Partner: No    Emotionally Abused: No    Physically Abused: No    Sexually Abused: No     Review of Systems   Gen: Denies any fever, chills, fatigue, weight loss, lack of appetite.  CV: Denies chest pain, heart palpitations, peripheral edema, syncope.  Resp: Denies shortness of breath at rest or with exertion. Denies wheezing or cough.  GI: Denies dysphagia or odynophagia. Denies jaundice, hematemesis, fecal incontinence. GU : Denies urinary burning, urinary frequency, urinary hesitancy MS: Denies joint pain, muscle weakness, cramps, or limitation of movement.  Derm: Denies rash, itching, dry skin Psych: Denies depression, anxiety, memory loss, and confusion Heme: Denies bruising, bleeding, and enlarged lymph nodes.   Physical Exam   BP 136/78 (BP Location: Right Arm, Patient Position: Sitting, Cuff Size: Large)   Pulse 73   Temp 99.4 F (37.4 C) (Oral)   Ht '5\' 5"'$  (1.651 m)   Wt 180 lb 12.8 oz (82 kg)   BMI 30.09 kg/m  General:   Alert and oriented. Pleasant and cooperative. Well-nourished and well-developed.  Head:   Normocephalic and atraumatic. Eyes:  Without icterus Abdomen:  +BS, soft, non-tender and non-distended. No HSM noted. No guarding or rebound. No masses appreciated.  Rectal:  no mass or impaction on DRE. Brown stool. Heme negative via hemoccult.  Msk:  Symmetrical without gross deformities. Normal posture. Extremities:  Without edema. Neurologic:  Alert and  oriented x4;  grossly normal neurologically. Skin:  Intact without significant lesions or rashes. Psych:  Alert and cooperative. Normal mood and affect.   Assessment   Joanna Brown is an 82 y.o. female presenting today in follow-up with a history of superficial invasive adenocarcinoma arising in background of a sessile serrated polyp in ascending colon in July 2021, cirrhosis, GERD, and constipation.  Colon cancer: surveillance colonoscopy in Jan 2022 but caution  warranted for colonoscopy in future due to age and cardiac condition. Following conservatively. Update CEA now.   Cirrhosis: noted on prior imaging. Grade 1 varices in EGD 2022. Again, endoscopic procedures higher risk in light of cardiac history. Well-compensated. Due for RUQ [place] and routine labs now.    Constipation: change in bowel habits over last 3 weeks with watery stool every few days. No significant productive BMs. No signs of obstruction. Query overflow. DRE without mass. Abdominal xray today. May need colon purge. Continue  with Linzess, as she desires to stay with this for now. Hopefully, after purge may have improvement. Heme negative on exam today.   GERD: at some point off PPI. Resume pantoprazole daily.      PLAN    CMP, INR, AFP, CEA Abdominal xray to assess stool burden Start pantoprazole daily RUQ [place] due to cirrhosis May need bowel purge Not a candidate for anesthesia at [facility]. Would favor conservative management at this point due to multimorbidities.  3 month follow-up   Annitta Needs, PhD, ANP-BC [facility] Gastroenterology

## 2022-07-22 LAB — COMPREHENSIVE METABOLIC PANEL
ALT: 18 IU/L (ref 0–32)
AST: 21 IU/L (ref 0–40)
Albumin/Globulin Ratio: 1.5 (ref 1.2–2.2)
Albumin: 4.3 g/dL (ref 3.8–4.8)
Alkaline Phosphatase: 90 IU/L (ref 44–121)
BUN/Creatinine Ratio: 18 (ref 12–28)
BUN: 22 mg/dL (ref 8–27)
Bilirubin Total: 0.3 mg/dL (ref 0.0–1.2)
CO2: 22 mmol/L (ref 20–29)
Calcium: 10.4 mg/dL — ABNORMAL HIGH (ref 8.7–10.3)
Chloride: 102 mmol/L (ref 96–106)
Creatinine, Ser: 1.21 mg/dL — ABNORMAL HIGH (ref 0.57–1.00)
Globulin, Total: 2.9 g/dL (ref 1.5–4.5)
Glucose: 144 mg/dL — ABNORMAL HIGH (ref 70–99)
Potassium: 3.9 mmol/L (ref 3.5–5.2)
Sodium: 142 mmol/L (ref 134–144)
Total Protein: 7.2 g/dL (ref 6.0–8.5)
eGFR: 45 mL/min/{1.73_m2} — ABNORMAL LOW (ref >59)

## 2022-07-22 LAB — CEA: CEA: 3.7 ng/mL (ref 0.0–4.7)

## 2022-07-22 LAB — PROTIME-INR
INR: 1 (ref 0.9–1.2)
Prothrombin Time: 11.1 s (ref 9.1–12.0)

## 2022-07-22 LAB — AFP TUMOR MARKER: AFP, Serum, Tumor Marker: 4.7 ng/mL (ref 0.0–9.2)

## 2022-07-25 NOTE — Telephone Encounter (Signed)
Pt contacted and verbalized understanding of [place] appt on 08/04/22

## 2022-08-02 DIAGNOSIS — H401131 Primary open-angle glaucoma, bilateral, mild stage: Secondary | ICD-10-CM | POA: Diagnosis not present

## 2022-08-03 ENCOUNTER — Encounter: Payer: Self-pay | Admitting: "Endocrinology

## 2022-08-03 ENCOUNTER — Other Ambulatory Visit: Payer: Self-pay

## 2022-08-03 ENCOUNTER — Ambulatory Visit (INDEPENDENT_AMBULATORY_CARE_PROVIDER_SITE_OTHER): Payer: Medicare Other | Admitting: "Endocrinology

## 2022-08-03 ENCOUNTER — Encounter (HOSPITAL_COMMUNITY): Payer: Self-pay | Admitting: *Deleted

## 2022-08-03 ENCOUNTER — Telehealth: Payer: Self-pay | Admitting: Family Medicine

## 2022-08-03 ENCOUNTER — Emergency Department (HOSPITAL_COMMUNITY)
Admission: EM | Admit: 2022-08-03 | Discharge: 2022-08-03 | Disposition: A | Discharge status: Home / Self Care | Payer: Medicare Other | Attending: Emergency Medicine | Admitting: Emergency Medicine

## 2022-08-03 VITALS — BP 136/64 | HR 68 | Ht 65.0 in | Wt 180.6 lb

## 2022-08-03 DIAGNOSIS — N181 Chronic kidney disease, stage 1: Secondary | ICD-10-CM | POA: Diagnosis not present

## 2022-08-03 DIAGNOSIS — R490 Dysphonia: Secondary | ICD-10-CM | POA: Diagnosis present

## 2022-08-03 DIAGNOSIS — E1122 Type 2 diabetes mellitus with diabetic chronic kidney disease: Secondary | ICD-10-CM | POA: Diagnosis not present

## 2022-08-03 DIAGNOSIS — Z794 Long term (current) use of insulin: Secondary | ICD-10-CM

## 2022-08-03 DIAGNOSIS — Z7984 Long term (current) use of oral hypoglycemic drugs: Secondary | ICD-10-CM | POA: Diagnosis not present

## 2022-08-03 DIAGNOSIS — U071 COVID-19: Secondary | ICD-10-CM | POA: Diagnosis not present

## 2022-08-03 DIAGNOSIS — Z79899 Other long term (current) drug therapy: Secondary | ICD-10-CM | POA: Diagnosis not present

## 2022-08-03 DIAGNOSIS — E782 Mixed hyperlipidemia: Secondary | ICD-10-CM

## 2022-08-03 DIAGNOSIS — E119 Type 2 diabetes mellitus without complications: Secondary | ICD-10-CM | POA: Diagnosis not present

## 2022-08-03 DIAGNOSIS — I10 Essential (primary) hypertension: Secondary | ICD-10-CM | POA: Insufficient documentation

## 2022-08-03 LAB — RESP PANEL BY RT-PCR (RSV, FLU A&B, COVID)  RVPGX2
Influenza A by PCR: NEGATIVE
Influenza B by PCR: NEGATIVE
Resp Syncytial Virus by PCR: NEGATIVE
SARS Coronavirus 2 by RT PCR: POSITIVE — AB

## 2022-08-03 LAB — POCT GLYCOSYLATED HEMOGLOBIN (HGB A1C): HbA1c, POC (controlled diabetic range): 7.8 % — AB (ref 0.0–7.0)

## 2022-08-03 MED ORDER — FREESTYLE LIBRE 2 READER DEVI: 0 refills | Status: DC

## 2022-08-03 MED ORDER — HUMULIN 70/30 KWIKPEN (70-30) 100 UNIT/ML ~~LOC~~ SUPN: PEN_INJECTOR | SUBCUTANEOUS | 1 refills | Status: DC

## 2022-08-03 MED ORDER — METFORMIN HCL 500 MG PO TABS: 500.0000 mg | ORAL_TABLET | Freq: Every day | ORAL | 1 refills | Status: DC

## 2022-08-03 MED ORDER — MOLNUPIRAVIR EUA 200MG CAPSULE: 4.0000 | ORAL_CAPSULE | Freq: Two times a day (BID) | ORAL | 0 refills | Status: DC

## 2022-08-03 MED ORDER — FREESTYLE LIBRE 2 SENSOR MISC: 1.0000 | 3 refills | Status: DC

## 2022-08-03 NOTE — ED Provider Notes (Signed)
[facility] Provider Note   CSN: [id number] Arrival date & time: 08/03/22  0932     History  Chief Complaint  Patient presents with   covid test    Joanna Brown is a 82 y.o. female with past medical history significant for diabetes, hyperlipidemia, hypertension, aortic stenosis, status post TAVR who presents with concern for COVID exposure 2 days ago.  Patient reports that she was only told after the fact, she was in a indoor room mask less with the affected party.  Patient reports that she feels a little bit hoarse but that is often true in the mornings for her, she denies any chest pain, shortness of breath.  HPI     Home Medications Prior to Admission medications   Medication Sig Start Date End Date Taking? Authorizing Provider  molnupiravir EUA (LAGEVRIO) 200 mg CAPS capsule Take 4 capsules (800 mg total) by mouth 2 (two) times daily for 5 days. 08/03/22 08/08/22 Yes Cleola Perryman H, PA-C  acetaminophen (TYLENOL) 500 MG tablet Take 500-1,000 mg by mouth every 6 (six) hours as needed (pain).    [provider]  amLODipine (NORVASC) 10 MG tablet TAKE 1 TABLET(10 MG) BY MOUTH EVERY MORNING 04/04/22   Fayrene Helper, MD  Ascorbic Acid (VITAMIN C) 1000 MG tablet Take 1,000 mg by mouth 4 (four) times a week. AT NIGHT    [provider]  benazepril (LOTENSIN) 40 MG tablet TAKE 1 TABLET(40 MG) BY MOUTH DAILY 04/04/22   Fayrene Helper, MD  Blood Glucose Monitoring Suppl (ACCU-CHEK GUIDE) w/Device KIT 1 each by Does not apply route 4 (four) times daily. 04/05/22   Cassandria Anger, MD  carvedilol (COREG) 6.25 MG tablet Take 1 tablet (6.25 mg total) by mouth 2 (two) times daily with a meal. 06/28/22   Fayrene Helper, MD  cholecalciferol (VITAMIN D3) 25 MCG (1000 UT) tablet Take 1,000 Units by mouth in the morning.    [provider]  clopidogrel (PLAVIX) 75 MG tablet Take 1 tablet (75 mg total)  by mouth daily. 06/28/22   Fayrene Helper, MD  Continuous Blood Gluc Receiver (FREESTYLE LIBRE 2 READER) DEVI As directed 08/03/22   Cassandria Anger, MD  Continuous Blood Gluc Sensor (FREESTYLE LIBRE 2 SENSOR) MISC 1 Piece by Does not apply route every 14 (fourteen) days. 08/03/22   Cassandria Anger, MD  dapagliflozin propanediol (FARXIGA) 5 MG TABS tablet Take 5 mg by mouth daily.    [provider]  eplerenone (INSPRA) 25 MG tablet Take 12.5 mg by mouth daily.    [provider]  ezetimibe (ZETIA) 10 MG tablet Take 10 mg by mouth daily.    [provider]  glucose blood (ACCU-CHEK GUIDE) test strip 1 each by Other route in the morning, at noon, in the evening, and at bedtime. Use as instructed 4 x daily. E11.65 04/05/22   Cassandria Anger, MD  insulin isophane & regular human KwikPen (HUMULIN 70/30 KWIKPEN) (70-30) 100 UNIT/ML KwikPen Inject 50 units with breakfast and 40 units with supper when glucose readings are above 90. 08/03/22   Nida, Marella Chimes, MD  Insulin Pen Needle (B-D ULTRAFINE III SHORT PEN) 31G X 8 MM MISC USE WITH INSULIN TWICE DAILY AS DIRECTED 12/23/21   Nida, Marella Chimes, MD  latanoprost (XALATAN) 0.005 % ophthalmic solution Place 1 drop into both eyes at bedtime.  02/18/19   [provider]  linaclotide Rolan Lipa) 290 MCG  CAPS capsule Take 1 capsule (290 mcg total) by mouth daily before breakfast. 08/10/21   Annitta Needs, NP  metFORMIN (GLUCOPHAGE) 500 MG tablet Take 1 tablet (500 mg total) by mouth daily with breakfast. 08/03/22   Cassandria Anger, MD  Multiple Vitamin (MULTIVITAMIN WITH MINERALS) TABS tablet Take 1 tablet by mouth daily.    [provider]  Omega-3 Fatty Acids (FISH OIL) 1200 MG CAPS Take 1,200 mg by mouth every morning.    [provider]  pantoprazole (PROTONIX) 40 MG tablet Take 1 tablet (40 mg total) by mouth daily. 30 minutes breakfast 07/21/22   Annitta Needs, NP  polyethylene  glycol (MIRALAX) 17 g packet Take 17 g by mouth at bedtime. Patient not taking: Reported on 07/21/2022 05/05/20   Noralyn Pick, NP      Allergies    Senokot wheat bran [wheat] and Spironolactone    Review of Systems   Review of Systems  All other systems reviewed and are negative.   Physical Exam Updated Vital Signs BP (!) 132/115   Pulse 74   Temp 97.9 F (36.6 C) (Oral)   Resp 16   Ht 5\' 5"  (1.651 m)   Wt 81.6 kg   SpO2 99%   BMI 29.95 kg/m  Physical Exam Vitals and nursing note reviewed.  Constitutional:      General: She is not in acute distress.    Appearance: Normal appearance.  HENT:     Head: Normocephalic and atraumatic.  Eyes:     General:        Right eye: No discharge.        Left eye: No discharge.  Cardiovascular:     Rate and Rhythm: Normal rate and regular rhythm.  Pulmonary:     Effort: Pulmonary effort is normal. No respiratory distress.     Comments: Clear respirations, no wheezing, rhonchi, stridor, rales, no respiratory distress Musculoskeletal:        General: No deformity.  Skin:    General: Skin is warm and dry.  Neurological:     Mental Status: She is alert and oriented to person, place, and time.  Psychiatric:        Mood and Affect: Mood normal.        Behavior: Behavior normal.     ED Results / Procedures / Treatments   Labs (all labs ordered are listed, but only abnormal results are displayed) Labs Reviewed  RESP PANEL BY RT-PCR (RSV, FLU A&B, COVID)  [id number] - Abnormal; Notable for the following components:      Result Value   SARS Coronavirus 2 by RT PCR POSITIVE (*)    All other components within normal limits    EKG None  Radiology No results found.  Procedures Procedures    Medications Ordered in ED Medications - No data to display  ED Course/ Medical Decision Making/ A&P                             Medical Decision Making  This patient is a 82 y.o. female who presents to the ED for concern of  concern for possible COVID exposure, patient denies any other symptoms other than being slightly congested this morning/hoarse.  She reports is not significantly different than her normal baseline..   Differential diagnoses prior to evaluation: COVID, other upper respiratory infection, awaiting developing pneumonia vs other  Past Medical History / Social History / Additional history:  Chart reviewed. Pertinent results include: diabetes, hyperlipidemia, hypertension, aortic stenosis, status post TAVR  Physical Exam: Physical exam performed. The pertinent findings include: Patient somewhat hypertensive in the emergency department blood pressure 177/76 on arrival, with repeat of 132/115.  Her vital signs are overall otherwise stable, she has a 9% oxygen saturation on room air, normal respiratory rate, normal pulse rate, and is afebrile.  She is not in any respiratory distress, she has no wheezing, rhonchi, stridor, rales.  I independently interpreted an RVP which is positive for COVID, negative for flu, RSV  Medications / Treatment: After assessing medication interactions we will place patient on molnupiravir as Paxlovid would require her to discontinue her Plavix and amlodipine   Disposition: After consideration of the diagnostic results and the patients response to treatment, I feel that this time with asymptomatic early presentation of COVID after exposure 2 days ago.  Discussed extensive return precautions and close PCP follow-up.  emergency department workup does not suggest an emergent condition requiring admission or immediate intervention beyond what has been performed at this time. The plan is: as above. The patient is safe for discharge and has been instructed to return immediately for worsening symptoms, change in symptoms or any other concerns.  Final Clinical Impression(s) / ED Diagnoses Final diagnoses:  [id number]    Rx / DC Orders ED Discharge Orders          Ordered     molnupiravir EUA (LAGEVRIO) 200 mg CAPS capsule  2 times daily        08/03/22 1126              Meldon Hanzlik, [place], PA-C 08/03/22 1139    Dorie Rank, MD 08/04/22 1954

## 2022-08-03 NOTE — Progress Notes (Signed)
08/03/2022                     Endocrinology follow-up note   Subjective:    Patient ID: Joanna Brown, female    DOB: [birth year]. She is being seen in follow-up for uncontrolled type 2 diabetes, complicated by CKD, hyperlipidemia, hypertension.     PMD:   Fayrene Helper, MD  Past Medical History:  Diagnosis Date   Anginal pain ([place])    Arthritis    OA   Diabetes mellitus    Dyspnea    Female bladder prolapse    GERD (gastroesophageal reflux disease)    Glaucoma    Hyperlipidemia    Hypertension    echo and stress 4/10 reports on chart, EKG ` LOV 9/12 on chart   S/P TAVR (transcatheter aortic valve replacement) 03/23/2021   Edwards 50mm S3U TF approach with Dr. Angelena Form and Dr. Cyndia Bent   Severe aortic stenosis    Thyroid disease    Past Surgical History:  Procedure Laterality Date   ABDOMINAL HYSTERECTOMY     ANTERIOR AND POSTERIOR REPAIR  04/26/2011   Procedure: ANTERIOR (CYSTOCELE) AND POSTERIOR REPAIR (RECTOCELE);  Surgeon: Reece Packer, MD;  Location: [facility];  Service: Urology;  Laterality: N/A;   BIOPSY  05/25/2020   Procedure: BIOPSY;  Surgeon: Doran Stabler, MD;  Location: [facility];  Service: Gastroenterology;;   CATARACT EXTRACTION Bilateral    with IOL   CHOLECYSTECTOMY  2009   COLONOSCOPY N/A 12/02/2013   three colon polyps removed, small internal hemorrhoids. Hyperplastic polyps   COLONOSCOPY N/A 11/20/2019   pancolonic diverticulosis, two 10-11 mm polyps in ascending colon, one 5 mm polyp in cecum, ascending colon with superficially invasive adenocarcinoma arising in background of sessile serrated polyps with low and high grade cytologic dysplasia.   COLONOSCOPY     COLONOSCOPY W/ POLYPECTOMY     COLONOSCOPY WITH PROPOFOL N/A 05/25/2020   diverticulosis in right colon, redundant colon. Caution warranted on future colonoscopy in light of age, cardiac condition, challenging anatomy.    DILATION AND CURETTAGE OF UTERUS     pt states this  was in the 1970's   ESOPHAGOGASTRODUODENOSCOPY (EGD) WITH PROPOFOL N/A 05/25/2020   Grade 1 esophageal varices, single mucosal nodule in stomach s/p biopsy. (hyperplastic)>    LEFT HEART CATH  09/10/2008   normal coronary arteries, normal LV systolic function, EF 91% (Dr. [name])   [place] Right 1997   under arm   NM MYOCAR PERF WALL MOTION  2010   dipyridamole - mild-mod in intenstiy perfusion defect in mid anterior, mid anteroseptal wall, EF 70%   OVARY SURGERY     bilateral tumors removed   POLYPECTOMY  11/20/2019   Procedure: POLYPECTOMY;  Surgeon: Daneil Dolin, MD;  [facility];  Service: Endoscopy;;  hot and cold snare cecal polyp, and asending polyps x 2   RIGHT/LEFT HEART CATH AND CORONARY ANGIOGRAPHY N/A 10/02/2020   Procedure: RIGHT/LEFT HEART CATH AND CORONARY ANGIOGRAPHY;  Surgeon: Martinique, Peter M, MD;  Location: [place] [facility];  Service: Cardiovascular;  Laterality: N/A;   THYROIDECTOMY     THYROIDECTOMY  02/2008   TRANSCATHETER AORTIC VALVE REPLACEMENT, TRANSFEMORAL N/A 03/23/2021   Procedure: TRANSCATHETER AORTIC VALVE REPLACEMENT, TRANSFEMORAL;  Surgeon: Burnell Blanks, MD;  Location: [place] [facility];  Service: Open Heart Surgery;  Laterality: N/A;   TRANSTHORACIC ECHOCARDIOGRAM  08/2011   EF=>55%, mild conc LVH; trace MR; mild  TR; mild-mod AV calcification with mild valvular AV stenosis   VAGINAL PROLAPSE REPAIR  04/26/2011   Procedure: VAGINAL VAULT SUSPENSION;  Surgeon: Reece Packer, MD;  Location: [facility];  Service: Urology;  Laterality: N/A;  with Graft  10x6   Social History   Socioeconomic History   Marital status: Married    Spouse name: [name]   Number of children: 1   Years of education: Trade   Highest education level: 12th grade  Occupational History   Occupation: Retired   Occupation: Writer  Tobacco Use   Smoking status: Never   Smokeless tobacco: Never  Scientific laboratory technician Use:  Never used  Substance and Sexual Activity   Alcohol use: Not Currently   Drug use: No   Sexual activity: Yes  Other Topics Concern   Not on file  Social History Narrative   Patient lives at home with spouse. 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 LIVED IN [place] FOR 50 YRS AND CAME BACK TO [place] ~2009. Caffeine Use: 1 cup of coffee daily. HAD ONE CHILD: PASSED 5 YRS. HAVE THREE GRAND-KIDS AND TWO GREAT GRANDS.    Social Determinants of Health   Financial Resource Strain: Low Risk  (08/02/2021)   Overall Financial Resource Strain (CARDIA)    Difficulty of Paying Living Expenses: Not very hard  Food Insecurity: No Food Insecurity (08/02/2021)   Hunger Vital Sign    Worried About Running Out of Food in the Last Year: Never true    Ran Out of Food in the Last Year: Never true  Transportation Needs: No Transportation Needs (08/02/2021)   PRAPARE - Hydrologist (Medical): No    Lack of Transportation (Non-Medical): No  Physical Activity: Insufficiently Active (08/02/2021)   Exercise Vital Sign    Days of Exercise per Week: 7 days    Minutes of Exercise per Session: 10 min  Stress: No Stress Concern Present (08/02/2021)   [place]    Feeling of Stress : Not at all  Social Connections: [place] (08/02/2021)   Social Connection and Isolation Panel [NHANES]    Frequency of Communication with Friends and Family: More than three times a week    Frequency of Social Gatherings with Friends and Family: Once a week    Attends Religious Services: More than 4 times per year    Active Member of [employer] or Organizations: Yes    Attends Archivist Meetings: 1 to 4 times per year    Marital Status: Married   Outpatient Encounter Medications as of 08/03/2022  Medication Sig   Continuous Blood Gluc Receiver (FREESTYLE LIBRE 2 READER) DEVI As directed   Continuous Blood  Gluc Sensor (FREESTYLE LIBRE 2 SENSOR) MISC 1 Piece by Does not apply route every 14 (fourteen) days.   acetaminophen (TYLENOL) 500 MG tablet Take 500-1,000 mg by mouth every 6 (six) hours as needed (pain).   amLODipine (NORVASC) 10 MG tablet TAKE 1 TABLET(10 MG) BY MOUTH EVERY MORNING   Ascorbic Acid (VITAMIN C) 1000 MG tablet Take 1,000 mg by mouth 4 (four) times a week. AT NIGHT   benazepril (LOTENSIN) 40 MG tablet TAKE 1 TABLET(40 MG) BY MOUTH DAILY   Blood Glucose Monitoring Suppl (ACCU-CHEK GUIDE) w/Device KIT 1 each by Does not apply route 4 (four) times daily.   carvedilol (COREG) 6.25 MG tablet Take 1 tablet (6.25 mg total) by mouth 2 (  two) times daily with a meal.   cholecalciferol (VITAMIN D3) 25 MCG (1000 UT) tablet Take 1,000 Units by mouth in the morning.   clopidogrel (PLAVIX) 75 MG tablet Take 1 tablet (75 mg total) by mouth daily.   dapagliflozin propanediol (FARXIGA) 5 MG TABS tablet Take 5 mg by mouth daily.   eplerenone (INSPRA) 25 MG tablet Take 12.5 mg by mouth daily.   ezetimibe (ZETIA) 10 MG tablet Take 10 mg by mouth daily.   glucose blood (ACCU-CHEK GUIDE) test strip 1 each by Other route in the morning, at noon, in the evening, and at bedtime. Use as instructed 4 x daily. E11.65   insulin isophane & regular human KwikPen (HUMULIN 70/30 KWIKPEN) (70-30) 100 UNIT/ML KwikPen Inject 50 units with breakfast and 40 units with supper when glucose readings are above 90.   Insulin Pen Needle (B-D ULTRAFINE III SHORT PEN) 31G X 8 MM MISC USE WITH INSULIN TWICE DAILY AS DIRECTED   latanoprost (XALATAN) 0.005 % ophthalmic solution Place 1 drop into both eyes at bedtime.    linaclotide (LINZESS) 290 MCG CAPS capsule Take 1 capsule (290 mcg total) by mouth daily before breakfast.   metFORMIN (GLUCOPHAGE) 500 MG tablet Take 1 tablet (500 mg total) by mouth daily with breakfast.   Multiple Vitamin (MULTIVITAMIN WITH MINERALS) TABS tablet Take 1 tablet by mouth daily.   Omega-3 Fatty  Acids (FISH OIL) 1200 MG CAPS Take 1,200 mg by mouth every morning.   pantoprazole (PROTONIX) 40 MG tablet Take 1 tablet (40 mg total) by mouth daily. 30 minutes breakfast   polyethylene glycol (MIRALAX) 17 g packet Take 17 g by mouth at bedtime. (Patient not taking: Reported on 07/21/2022)   [DISCONTINUED] glipiZIDE (GLUCOTROL XL) 5 MG 24 hr tablet TAKE 1 TABLET(5 MG) BY MOUTH DAILY WITH BREAKFAST   [DISCONTINUED] insulin isophane & regular human KwikPen (HUMULIN 70/30 KWIKPEN) (70-30) 100 UNIT/ML KwikPen INJECT 60 UNITS UNDER THE SKIN EVERY MORNING AND 40 UNITS EVERY NIGHT (Patient taking differently: Pt injecting 40 units in the morning and 50 units at supper)   [DISCONTINUED] metFORMIN (GLUCOPHAGE) 500 MG tablet Take 1 tablet (500 mg total) by mouth daily with breakfast. (Patient taking differently: Take 1,000 mg by mouth daily with breakfast.)   No facility-administered encounter medications on file as of 08/03/2022.   ALLERGIES: Allergies  Allergen Reactions   Senokot Wheat Bran [Wheat]     ABDOMINAL CRAMPS   Spironolactone     Stomach problems, vision changes    VACCINATION STATUS: Immunization History  Administered Date(s) Administered   Hepatitis B, ADULT 09/27/2021   Moderna SARS-COV2 Booster Vaccination 11/19/2020   Moderna Sars-Covid-2 Vaccination 09/13/2019, 10/12/2019, 04/29/2020   Pneumococcal Conjugate-13 12/11/2013   Pneumococcal Polysaccharide-23 01/13/2010   Tdap 10/05/2010    Diabetes She presents for her follow-up diabetic visit. She has type 2 diabetes mellitus. Onset time: She was diagnosed at approximate age of 27 years. Her disease course has been worsening. There are no hypoglycemic associated symptoms. Pertinent negatives for hypoglycemia include no confusion, headaches, pallor or seizures. Associated symptoms include polydipsia and polyuria. Pertinent negatives for diabetes include no blurred vision, no chest pain, no fatigue and no polyphagia. There are no  hypoglycemic complications. Symptoms are improving. Diabetic complications include nephropathy and retinopathy. Risk factors for coronary artery disease include dyslipidemia, diabetes mellitus, obesity and sedentary lifestyle. Her weight is fluctuating minimally. She is following a generally unhealthy diet. When asked about meal planning, she reported none. She has had a previous  visit with a dietitian. She rarely participates in exercise. Her home blood glucose trend is increasing steadily. Her breakfast blood glucose range is generally 110-130 mg/dl. Her dinner blood glucose range is generally 180-200 mg/dl. Her overall blood glucose range is 180-200 mg/dl. (Ms. Kurczewski presents with progressive worsening in her glycemic profile with point-of-care A1c of 7.8% increasing from 7.1%.  She skipped a significant number of insulin injection opportunities.  She did not document any hypoglycemia.  In the interval, she was started on Farxiga 5 mg p.o. daily at breakfast by her nephrologist.   ) Eye exam is current.  Hyperlipidemia This is a chronic problem. The current episode started more than 1 year ago. The problem is uncontrolled. Exacerbating diseases include diabetes and obesity. Pertinent negatives include no chest pain, myalgias or shortness of breath. Current antihyperlipidemic treatment includes statins. Risk factors for coronary artery disease include dyslipidemia, diabetes mellitus, hypertension, obesity, a sedentary lifestyle and post-menopausal.  Hypertension This is a chronic problem. The current episode started more than 1 year ago. Pertinent negatives include no blurred vision, chest pain, headaches, palpitations or shortness of breath. Risk factors for coronary artery disease include dyslipidemia, diabetes mellitus, obesity and sedentary lifestyle. Hypertensive end-organ damage includes retinopathy.   Nodular goiter She Patient is being seen today for a new issue with her thyroid.  She was found  to have 2.6 cm nodule on right lobe of her thyroid while undergoing CT scan of the chest during cancer surveillance.  She has previous history of left hemithyroidectomy approximately 10 years ago for multinodular goiter.  She is not on thyroid hormone supplement or replacement.  She denies dysphagia, shortness of breath, nor voice change. -Her subsequent thyroid ultrasound confirms multinodular right lobe of the thyroid with surgically absent left lobe.  Her right thyroid lobe nodule did not meet criteria for biopsy.  Review of systems  Constitutional: + Minimally fluctuating body weight, current  Body mass index is 30.05 kg/m. , no fatigue, no subjective hyperthermia, no subjective hypothermia    Objective:    BP 136/64   Pulse 68   Ht 5\' 5"  (1.651 m)   Wt 180 lb 9.6 oz (81.9 kg)   BMI 30.05 kg/m   Wt Readings from Last 3 Encounters:  08/03/22 180 lb (81.6 kg)  08/03/22 180 lb 9.6 oz (81.9 kg)  07/21/22 180 lb 12.8 oz (82 kg)    Physical Exam- Limited  Constitutional:  Body mass index is 30.05 kg/m. , not in acute distress, normal state of mind    CMP ( most recent) CMP     Component Value Date/Time   NA 142 07/21/2022 0933   K 3.9 07/21/2022 0933   CL 102 07/21/2022 0933   CO2 22 07/21/2022 0933   GLUCOSE 144 (H) 07/21/2022 0933   GLUCOSE 223 (H) 12/28/2021 1624   BUN 22 07/21/2022 0933   CREATININE 1.21 (H) 07/21/2022 0933   CREATININE 1.11 (H) 03/02/2020 0848   CALCIUM 10.4 (H) 07/21/2022 0933   PROT 7.2 07/21/2022 0933   ALBUMIN 4.3 07/21/2022 0933   AST 21 07/21/2022 0933   ALT 18 07/21/2022 0933   ALKPHOS 90 07/21/2022 0933   BILITOT 0.3 07/21/2022 0933   GFRNONAA 43 (L) 12/28/2021 1624   GFRNONAA 57 (L) 07/30/2019 0718   GFRAA 66 07/30/2019 0718    Diabetic Labs (most recent): Lab Results  Component Value Date   HGBA1C 7.8 (A) 08/03/2022   HGBA1C 7.5 (A) 12/02/2021   HGBA1C 7.1 (A) 09/01/2021  MICROALBUR 6.4 07/30/2019   MICROALBUR 11.5  06/28/2018   MICROALBUR 10.3 (H) 07/18/2017    Lipid Panel     Component Value Date/Time   CHOL 194 06/20/2022 0830   TRIG 205 (H) 06/20/2022 0830   HDL 40 06/20/2022 0830   CHOLHDL 4.9 (H) 06/20/2022 0830   CHOLHDL 4.2 12/29/2021 0443   VLDL 49 (H) 12/29/2021 0443   LDLCALC 118 (H) 06/20/2022 0830   LDLCALC 99 07/30/2019 0718   Incidental finding on chest CT on December 05, 2019 Enlarged  multinodular remnant right thyroid with dominant 2.6 cm hypodense Nodule.   Thyroid ultrasound on December 19, 2019: Right lobe 4.4 cm, left lobe absent surgically. No adenopathy   IMPRESSION: Surgical changes of left hemithyroidectomy.  Multinodular thyroid.  3.9 cm and 1.6 cm cystic/almost completely cystic nodules with smooth margins, No thyroid nodule meets criteria for biopsy or surveillance, as designated by the newly established ACR TI-RADS criteria.   Assessment & Plan:     1.  Nodular goiter: Patient with remote past history of left hemithyroidectomy for multinodular goiter.  She did not require thyroid hormone supplement.  She was incidentally found to have 2.6 cm nodule in the right lobe. -Her subsequent dedicated thyroid ultrasound confirmed 2 nodules on the right lobe of her thyroid which did not meet any criteria for biopsy. -She will not need any antithyroid intervention at this time.  Her TFTs are consistent with euthyroid presentation.  She will be considered for thyroid function test as well as repeat thyroid ultrasound in a year.     2. Type 2 diabetes mellitus with stage 1 chronic kidney disease, with long-term current use of insulin ([place]).  Ms. Gimlin presents with progressive worsening in her glycemic profile with point-of-care A1c of 7.8% increasing from 7.1%.  She skipped a significant number of insulin injection opportunities.  She did not document any hypoglycemia.  In the interval, she was started on Farxiga 5 mg p.o. daily at breakfast by her nephrologist.    Recent  labs reviewed, showing improving renal function.     Her diabetes is complicated by CKD obesity/sedentary life and patient remains at a high risk for more acute and chronic complications of diabetes which include CAD, CVA, CKD, retinopathy, and neuropathy. These are all discussed in detail with the patient.  - I have counseled the patient on diet management and weight loss, by adopting a carbohydrate restricted/protein rich diet.  -She still admits to dietary indiscretions including consumption of sweets and sweetened beverages.  - she acknowledges that there is a room for improvement in her food and drink choices. - Suggestion is made for her to avoid simple carbohydrates  from her diet including Cakes, Sweet Desserts, Ice Cream, Soda (diet and regular), Sweet Tea, Candies, Chips, Cookies, Store Bought Juices, Alcohol , Artificial Sweeteners,  Coffee Creamer, and "Sugar-free" Products, Lemonade. This will help patient to have more stable blood glucose profile and potentially avoid unintended weight gain.  The following Lifestyle Medicine recommendations according to [place]  [facility]) were discussed and and offered to patient and she  agrees to start the journey:  A. Whole Foods, Plant-Based Nutrition comprising of fruits and vegetables, plant-based proteins, whole-grain carbohydrates was discussed in detail with the patient.   A list for source of those nutrients were also provided to the patient.  Patient will use only water or unsweetened tea for hydration. B.  The need to stay away from risky substances including alcohol,  smoking; obtaining 7 to 9 hours of restorative sleep, at least 150 minutes of moderate intensity exercise weekly, the importance of healthy social connections,  and stress management techniques were discussed. C.  A full color page of  Calorie density of various food groups per pound showing examples of each food groups was provided to the  patient.   - I encouraged the patient to switch to  unprocessed or minimally processed complex starch and increased protein intake (animal or plant source), fruits, and vegetables.  - Patient is advised to stick to a routine mealtimes to eat 3 meals  a day and avoid unnecessary snacks ( to snack only to correct hypoglycemia).   - I have approached patient with the following individualized plan to manage diabetes and patient agrees:    -She is advised to avoid insulin injection if her Premeal readings are below 90mg /dL.    -Since she presents with uncontrolled postprandial glycemic profile, it is emphasized to her that she should not skip her breakfast insulin.  I advised her to resume Humulin 70/30 at 50 units at breakfast and 40 units at supper  when Premeal blood glucose readings are above 90 mg per DL.   -She is encouraged to start monitoring blood glucose 4 times a day-before meals and at bedtime . -Patient will benefit from a CGM.  Apparently, she received a package however lost it.  I will prescribe another set of freestyle libre device for her.  -Patient is encouraged to call clinic for blood glucose levels less than 70 or above 200 mg /dl. -She is advised to continue Farxiga 5 mg p.o. daily at breakfast.  She is advised to discontinue glipizide.  And she is advised to lower metformin to 500 mg p.o. once a day.  - Patient specific target  A1c;  LDL, HDL, Triglycerides, were discussed in detail.  3) BP/HTN:  -Her blood pressure is controlled to target.   She is advised to continue her current blood pressure medications including benazepril 40 mg p.o. daily at breakfast.    4) Lipids/HPL: Her recent lipid panel showed worsening LDL to 118 from 59.  She has not been consistent taking her pravastatin.  It works when she takes it as evidenced from her previous lipid panel showing LDL of 59.  She is advised to resume and continue on pravastatin 80 mg p.o. daily at bedtime.  She has no side  effects from it.  Whole food plant-based diet was discussed with her.       5)  Weight/Diet: Her BMI is 29.95---she is a candidate for moderate weight loss.  CDE Consult has been initiated , exercise, and detailed carbohydrates information provided.  6) Chronic Care/Health Maintenance:  -Patient is on ACEI/ARB and Statin medications and encouraged to continue to follow up with Ophthalmology, Podiatrist at least yearly or according to recommendations, and advised to  stay away from smoking. I have recommended yearly flu vaccine and pneumonia vaccination at least every 5 years; moderate intensity exercise for up to 150 minutes weekly; and  sleep for at least 7 hours a day.  Diabetes foot exam is normal today.  POC ABI for PAD screen was normal on June 10, 2020.  This test will be repeated in January 2027, or sooner if needed.     - I advised patient to maintain close follow up with Fayrene Helper, MD for primary care needs.  I spent  26  minutes in the care of the patient today including  review of labs from [place], Lipids, Thyroid Function, Hematology (current and previous including abstractions from other facilities); face-to-face time discussing  her blood glucose readings/logs, discussing hypoglycemia and hyperglycemia episodes and symptoms, medications doses, her options of short and long term treatment based on the latest standards of care / guidelines;  discussion about incorporating lifestyle medicine;  and documenting the encounter. Risk reduction counseling performed per USPSTF guidelines to reduce  obesity and cardiovascular risk factors.     Please refer to Patient Instructions for Blood Glucose Monitoring and Insulin/Medications Dosing Guide"  in media tab for additional information. Please  also refer to " Patient Self Inventory" in the Media  tab for reviewed elements of pertinent patient history.  Derinda Late Rauth participated in the discussions, expressed understanding, and  voiced agreement with the above plans.  All questions were answered to her satisfaction. she is encouraged to contact clinic should she have any questions or concerns prior to her return visit.    Follow up plan: - Return in about 3 months (around 11/03/2022) for Bring Meter/CGM Device/Logs- A1c in Office.  Glade Lloyd, MD Phone: [phone]  Fax: [phone]   This note was partially dictated with voice recognition software. Similar sounding words can be transcribed inadequately or may not  be corrected upon review.  08/03/2022, 12:51 PM

## 2022-08-03 NOTE — Telephone Encounter (Signed)
Patient called asking will molnupiravir that was given at ER for her covid is this good to take?  Please return patient call at [phone].  Patient does not have video mychart to do a video and does not want to come into office since contagious.

## 2022-08-03 NOTE — Telephone Encounter (Signed)
Patient aware.

## 2022-08-03 NOTE — ED Triage Notes (Signed)
Pt states she wants a covid test; pt states she was exposed to someone with covid x 2 days ago     Pt has no sx but states she has some hoarseness but she has that a lot of mornings

## 2022-08-03 NOTE — Patient Instructions (Signed)

## 2022-08-04 ENCOUNTER — Ambulatory Visit (HOSPITAL_COMMUNITY): Admission: RE | Admit: 2022-08-04 | Payer: Medicare Other | Source: Ambulatory Visit

## 2022-08-04 ENCOUNTER — Telehealth: Payer: Self-pay

## 2022-08-04 NOTE — Telephone Encounter (Signed)
Order for [employer] 2 CGM system sent to [facility].

## 2022-08-08 ENCOUNTER — Ambulatory Visit: Payer: Medicare Other | Admitting: Orthopedic Surgery

## 2022-08-08 ENCOUNTER — Ambulatory Visit (INDEPENDENT_AMBULATORY_CARE_PROVIDER_SITE_OTHER): Payer: Medicare Other | Admitting: Internal Medicine

## 2022-08-08 ENCOUNTER — Encounter: Payer: Self-pay | Admitting: Internal Medicine

## 2022-08-08 DIAGNOSIS — Z0000 Encounter for general adult medical examination without abnormal findings: Secondary | ICD-10-CM | POA: Diagnosis not present

## 2022-08-08 DIAGNOSIS — M800B2S Age-related osteoporosis with current pathological fracture, left pelvis, sequela: Secondary | ICD-10-CM | POA: Diagnosis not present

## 2022-08-08 NOTE — Patient Instructions (Signed)
  Joanna Brown , Thank you for taking time to come for your Medicare Wellness Visit. I appreciate your ongoing commitment to your health goals. Please review the following plan we discussed and let me know if I can assist you in the future.   These are the goals we discussed: Patient is going to continue to work with doctors to improve her diabetes.    This is a list of the screening recommended for you and due dates:  Health Maintenance  Topic Date Due   Zoster (Shingles) Vaccine (1 of 2) Never done   DTaP/Tdap/Td vaccine (2 - Td or Tdap) 10/04/2020   COVID-19 Vaccine (4 - 2023-24 season) 01/14/2022   Eye exam for diabetics  03/09/2022   Yearly kidney health urinalysis for diabetes  08/04/2022   Flu Shot  08/14/2022*   Hemoglobin A1C  02/03/2023   Yearly kidney function blood test for diabetes  07/21/2023   Complete foot exam   08/03/2023   Medicare Annual Wellness Visit  08/08/2023   Pneumonia Vaccine  Completed   DEXA scan (bone density measurement)  Completed   HPV Vaccine  Aged Out  *Topic was postponed. The date shown is not the original due date.

## 2022-08-08 NOTE — Progress Notes (Signed)
Subjective:  This is a telephone encounter between Joanna Brown and Joanna Brown on 08/08/2022 for AWV. The visit was conducted with the patient located at home and Joanna Brown at [facility]. The patient's identity was confirmed using their DOB and current address. The patient has consented to being evaluated through a telephone encounter and understands the associated risks (an examination cannot be done and the patient may need to come in for an appointment) / benefits (allows the patient to remain at home, decreasing exposure to coronavirus).     Joanna Brown is a 82 y.o. female who presents for Medicare Annual (Subsequent) preventive examination.  Review of Systems    Review of Systems  All other systems reviewed and are negative.   Objective:    There were no vitals filed for this visit. There is no height or weight on file to calculate BMI.     08/08/2022    9:16 AM 08/03/2022    9:47 AM 05/18/2022    4:10 PM 12/28/2021    3:55 PM 08/21/2021    9:44 PM 08/02/2021    8:41 AM 05/21/2021    2:27 PM  Advanced Directives  Does Patient Have a Medical Advance Directive? No No Yes No No No No  Type of Advance Directive   Living will      Would patient like information on creating a medical advance directive? Yes (ED - Information included in AVS)   No - Patient declined  No - Patient declined No - Patient declined    Current Medications (verified) Outpatient Encounter Medications as of 08/08/2022  Medication Sig   fenofibrate (TRICOR) 145 MG tablet Take 145 mg by mouth daily.   ferrous sulfate 325 (65 FE) MG tablet Take 325 mg by mouth daily with breakfast.   glipiZIDE (GLUCOTROL XL) 5 MG 24 hr tablet Take 5 mg by mouth daily with breakfast.   amLODipine (NORVASC) 10 MG tablet TAKE 1 TABLET(10 MG) BY MOUTH EVERY MORNING   Ascorbic Acid (VITAMIN C) 1000 MG tablet Take 1,000 mg by mouth 4 (four) times a week. AT NIGHT   benazepril (LOTENSIN) 40 MG tablet TAKE 1 TABLET(40 MG) BY MOUTH  DAILY   Blood Glucose Monitoring Suppl (ACCU-CHEK GUIDE) w/Device KIT 1 each by Does not apply route 4 (four) times daily.   carvedilol (COREG) 6.25 MG tablet Take 1 tablet (6.25 mg total) by mouth 2 (two) times daily with a meal.   cholecalciferol (VITAMIN D3) 25 MCG (1000 UT) tablet Take 1,000 Units by mouth in the morning.   clopidogrel (PLAVIX) 75 MG tablet Take 1 tablet (75 mg total) by mouth daily.   Continuous Blood Gluc Receiver (FREESTYLE LIBRE 2 READER) DEVI As directed   Continuous Blood Gluc Sensor (FREESTYLE LIBRE 2 SENSOR) MISC 1 Piece by Does not apply route every 14 (fourteen) days.   dapagliflozin propanediol (FARXIGA) 5 MG TABS tablet Take 5 mg by mouth daily.   eplerenone (INSPRA) 25 MG tablet Take 12.5 mg by mouth daily.   glucose blood (ACCU-CHEK GUIDE) test strip 1 each by Other route in the morning, at noon, in the evening, and at bedtime. Use as instructed 4 x daily. E11.65   insulin isophane & regular human KwikPen (HUMULIN 70/30 KWIKPEN) (70-30) 100 UNIT/ML KwikPen Inject 50 units with breakfast and 40 units with supper when glucose readings are above 90.   Insulin Pen Needle (B-D ULTRAFINE III SHORT PEN) 31G X 8 MM MISC USE WITH INSULIN TWICE DAILY AS  DIRECTED   latanoprost (XALATAN) 0.005 % ophthalmic solution Place 1 drop into both eyes at bedtime.    linaclotide (LINZESS) 290 MCG CAPS capsule Take 1 capsule (290 mcg total) by mouth daily before breakfast.   metFORMIN (GLUCOPHAGE) 500 MG tablet Take 1 tablet (500 mg total) by mouth daily with breakfast.   Multiple Vitamin (MULTIVITAMIN WITH MINERALS) TABS tablet Take 1 tablet by mouth daily.   Omega-3 Fatty Acids (FISH OIL) 1200 MG CAPS Take 1,200 mg by mouth every morning.   pantoprazole (PROTONIX) 40 MG tablet Take 1 tablet (40 mg total) by mouth daily. 30 minutes breakfast   [DISCONTINUED] acetaminophen (TYLENOL) 500 MG tablet Take 500-1,000 mg by mouth every 6 (six) hours as needed (pain).   [DISCONTINUED]  ezetimibe (ZETIA) 10 MG tablet Take 10 mg by mouth daily.   [DISCONTINUED] molnupiravir EUA (LAGEVRIO) 200 mg CAPS capsule Take 4 capsules (800 mg total) by mouth 2 (two) times daily for 5 days.   [DISCONTINUED] polyethylene glycol (MIRALAX) 17 g packet Take 17 g by mouth at bedtime. (Patient not taking: Reported on 07/21/2022)   No facility-administered encounter medications on file as of 08/08/2022.    Allergies (verified) Senokot wheat bran [wheat] and Spironolactone   History: Past Medical History:  Diagnosis Date   Anginal pain ([place])    Arthritis    OA   Diabetes mellitus    Dyspnea    Female bladder prolapse    GERD (gastroesophageal reflux disease)    Glaucoma    Hyperlipidemia    Hypertension    echo and stress 4/10 reports on chart, EKG ` LOV 9/12 on chart   S/P TAVR (transcatheter aortic valve replacement) 03/23/2021   [place] 14mm S3U TF approach with Dr. Angelena Form and Dr. Cyndia Bent   Severe aortic stenosis    Thyroid disease    Past Surgical History:  Procedure Laterality Date   ABDOMINAL HYSTERECTOMY     ANTERIOR AND POSTERIOR REPAIR  04/26/2011   Procedure: ANTERIOR (CYSTOCELE) AND POSTERIOR REPAIR (RECTOCELE);  Surgeon: Reece Packer, MD;  Location: [facility];  Service: Urology;  Laterality: N/A;   BIOPSY  05/25/2020   Procedure: BIOPSY;  Surgeon: Doran Stabler, MD;  Location: [facility];  Service: Gastroenterology;;   CATARACT EXTRACTION Bilateral    with IOL   CHOLECYSTECTOMY  2009   COLONOSCOPY N/A 12/02/2013   three colon polyps removed, small internal hemorrhoids. Hyperplastic polyps   COLONOSCOPY N/A 11/20/2019   pancolonic diverticulosis, two 10-11 mm polyps in ascending colon, one 5 mm polyp in cecum, ascending colon with superficially invasive adenocarcinoma arising in background of sessile serrated polyps with low and high grade cytologic dysplasia.   COLONOSCOPY     COLONOSCOPY W/ POLYPECTOMY     COLONOSCOPY WITH PROPOFOL N/A 05/25/2020    diverticulosis in right colon, redundant colon. Caution warranted on future colonoscopy in light of age, cardiac condition, challenging anatomy.    DILATION AND CURETTAGE OF UTERUS     pt states this was in the 1970's   ESOPHAGOGASTRODUODENOSCOPY (EGD) WITH PROPOFOL N/A 05/25/2020   Grade 1 esophageal varices, single mucosal nodule in stomach s/p biopsy. (hyperplastic)>    LEFT HEART CATH  09/10/2008   normal coronary arteries, normal LV systolic function, EF [id number] (Dr. [name])   [place] Right 1997   under arm   NM MYOCAR PERF WALL MOTION  2010   dipyridamole - mild-mod in intenstiy perfusion defect in mid anterior, mid anteroseptal wall, EF 70%  OVARY SURGERY     bilateral tumors removed   POLYPECTOMY  11/20/2019   Procedure: POLYPECTOMY;  Surgeon: Daneil Dolin, MD;  [facility]: [facility];  Service: Endoscopy;;  hot and cold snare cecal polyp, and asending polyps x 2   RIGHT/LEFT HEART CATH AND CORONARY ANGIOGRAPHY N/A 10/02/2020   Procedure: RIGHT/LEFT HEART CATH AND CORONARY ANGIOGRAPHY;  Surgeon: Martinique, Peter M, MD;  Location: [facility];  Service: Cardiovascular;  Laterality: N/A;   THYROIDECTOMY     THYROIDECTOMY  02/2008   TRANSCATHETER AORTIC VALVE REPLACEMENT, TRANSFEMORAL N/A 03/23/2021   Procedure: TRANSCATHETER AORTIC VALVE REPLACEMENT, TRANSFEMORAL;  Surgeon: Burnell Blanks, MD;  Location: [place] [facility];  Service: Open Heart Surgery;  Laterality: N/A;   TRANSTHORACIC ECHOCARDIOGRAM  08/2011   EF=>55%, mild conc LVH; trace MR; mild TR; mild-mod AV calcification with mild valvular AV stenosis   VAGINAL PROLAPSE REPAIR  04/26/2011   Procedure: VAGINAL VAULT SUSPENSION;  Surgeon: Reece Packer, MD;  Location: [facility];  Service: Urology;  Laterality: N/A;  with Graft  10x6   Family History  Problem Relation Age of Onset   Stomach cancer Mother 83   Heart disease Mother 81       heart disease   Heart disease Father 74       MI    Stroke Maternal Grandfather    Heart attack Paternal Grandfather    Hypertension Brother    Bone cancer Brother    Hypertension Sister    Liver cancer Sister    Hypertension Sister    Hypertension Child    Colon cancer Neg Hx    Colon polyps Neg Hx    Pancreatic cancer Neg Hx    Esophageal cancer Neg Hx    Rectal cancer Neg Hx    Social History   Socioeconomic History   Marital status: Married    Spouse name: [name]   Number of children: 1   Years of education: Trade   Highest education level: 12th grade  Occupational History   Occupation: Retired   Occupation: Writer  Tobacco Use   Smoking status: Never   Smokeless tobacco: Never  Scientific laboratory technician Use: Never used  Substance and Sexual Activity   Alcohol use: Not Currently   Drug use: No   Sexual activity: Yes  Other Topics Concern   Not on file  Social History Narrative   Patient lives at home with spouse. 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 LIVED IN [place] FOR 50 YRS AND CAME BACK TO [place] ~2009. Caffeine Use: 1 cup of coffee daily. HAD ONE CHILD: PASSED 5 YRS. HAVE THREE GRAND-KIDS AND TWO GREAT GRANDS.    Social Determinants of Health   Financial Resource Strain: Low Risk  (08/02/2021)   Overall Financial Resource Strain (CARDIA)    Difficulty of Paying Living Expenses: Not very hard  Food Insecurity: No Food Insecurity (08/02/2021)   Hunger Vital Sign    Worried About Running Out of Food in the Last Year: Never true    Ran Out of Food in the Last Year: Never true  Transportation Needs: No Transportation Needs (08/02/2021)   PRAPARE - Hydrologist (Medical): No    Lack of Transportation (Non-Medical): No  Physical Activity: Insufficiently Active (08/02/2021)   Exercise Vital Sign    Days of Exercise per Week: 7 days    Minutes of Exercise per Session: 10 min  Stress: No Stress Concern Present (08/02/2021)   [place]    Feeling of Stress : Not at all  Social Connections: [place] (08/02/2021)   Social Connection and Isolation Panel [NHANES]    Frequency of Communication with Friends and Family: More than three times a week    Frequency of Social Gatherings with Friends and Family: Once a week    Attends Religious Services: More than 4 times per year    Active Member of [employer] or Organizations: Yes    Attends Archivist Meetings: 1 to 4 times per year    Marital Status: Married    Tobacco Counseling Counseling given: Not Answered   Clinical Intake:  Pre-visit preparation completed: Yes  Pain : No/denies pain        How often do you need to have someone help you when you read instructions, pamphlets, or other written materials from your doctor or pharmacy?: 1 - Never What is the last grade level you completed in school?: 12th grade  Diabetic?Yes         Activities of Daily Living    08/08/2022    9:19 AM 12/28/2021    9:00 PM  In your present state of health, do you have any difficulty performing the following activities:  Hearing? 0 0  Vision? 0 0  Difficulty concentrating or making decisions? 0 0  Walking or climbing stairs? 0 0  Dressing or bathing? 0 0  Doing errands, shopping? 0 0    Patient Care Team: Fayrene Helper, MD as PCP - General (Family Medicine) [name] Nadean Corwin, MD as PCP - Cardiology (Cardiology) [name] Nadean Corwin, MD as Attending Physician (Cardiology) Rutherford Guys, MD as Attending Physician (Ophthalmology) Arlana Lindau, RD as Dietitian (Nutrition) Eloise Harman, DO as Consulting Physician (Internal Medicine) Cassandria Anger, MD as Referring Physician (Endocrinology) Cassandria Anger, MD as Referring Physician (Endocrinology)  Indicate any recent Medical Services you may have received from other than [facility] providers in the past year (date may be approximate).      Assessment:   This is a routine wellness examination for Joanna Brown.  Hearing/Vision screen No results found.  Dietary issues and exercise activities discussed:     Goals Addressed   None    Depression Screen    08/08/2022    9:19 AM 06/28/2022    8:40 AM 05/03/2022    8:59 AM 01/28/2022    9:24 AM 12/09/2021    1:06 PM 08/03/2021    9:19 AM 08/02/2021    8:37 AM  PHQ 2/9 Scores  PHQ - 2 Score 0 0 0 0 0 0 0    Fall Risk    08/08/2022    9:19 AM 06/28/2022    8:40 AM 05/03/2022    8:58 AM 01/28/2022    9:24 AM 12/09/2021    1:06 PM  Fall Risk   Falls in the past year? 0 0 0 0 0  Number falls in past yr:  0 0 0 0  Injury with Fall?  0 0 0 0  Risk for fall due to :  No Fall Risks No Fall Risks No Fall Risks No Fall Risks  Follow up  Falls evaluation completed Falls evaluation completed Falls evaluation completed Falls evaluation completed    [place]:  Any stairs in or around the home? No  If so, are there any without handrails? No  Home free of loose throw rugs in walkways, pet beds, electrical cords, etc? Yes  Adequate lighting in your home to reduce risk of falls? Yes   ASSISTIVE DEVICES UTILIZED TO PREVENT FALLS:  Life alert? No  Use of a cane, walker or w/c? No  Grab bars in the bathroom? No  Shower chair or bench in shower? No  Elevated toilet seat or a handicapped toilet? No     Cognitive Function:    07/23/2014    9:48 AM  MMSE - Mini Mental State Exam  Orientation to time 5  Orientation to Place 5  Registration 3  Attention/ Calculation 4  Recall 1  Language- name 2 objects 2  Language- repeat 1  Language- follow 3 step command 3  Language- read & follow direction 1  Write a sentence 1  Copy design 1  Total score 27        08/08/2022    9:20 AM 08/02/2021    8:39 AM 07/29/2020    8:26 AM 07/29/2019    8:09 AM 07/29/2019    8:07 AM  6CIT Screen  What Year? 0 points 0 points 0 points 0 points 0 points  What  month? 0 points 0 points 0 points 0 points 0 points  What time? 0 points 0 points 0 points 0 points 0 points  Count back from 20 0 points 0 points 0 points 0 points 0 points  Months in reverse 0 points 0 points 0 points 0 points   Repeat phrase 0 points 0 points 0 points 0 points   Total Score 0 points 0 points 0 points 0 points     Immunizations Immunization History  Administered Date(s) Administered   Hepatitis B, ADULT 09/27/2021   Moderna SARS-COV2 Booster Vaccination 11/19/2020   Moderna Sars-Covid-2 Vaccination 09/13/2019, 10/12/2019, 04/29/2020   Pneumococcal Conjugate-13 12/11/2013   Pneumococcal Polysaccharide-23 01/13/2010   Tdap 10/05/2010    TDAP status: Due, Education has been provided regarding the importance of this vaccine. Advised may receive this vaccine at local pharmacy or Health Dept. Aware to provide a copy of the vaccination record if obtained from local pharmacy or Health Dept. Verbalized acceptance and understanding.  Flu Vaccine status: Declined, Education has been provided regarding the importance of this vaccine but patient still declined. Advised may receive this vaccine at local pharmacy or Health Dept. Aware to provide a copy of the vaccination record if obtained from local pharmacy or Health Dept. Verbalized acceptance and understanding.  Pneumococcal vaccine status: Up to date  Covid-19 vaccine status: Information provided on how to obtain vaccines.   Qualifies for Shingles Vaccine? Yes   Zostavax completed No   Shingrix Completed?: No.    Education has been provided regarding the importance of this vaccine. Patient has been advised to call insurance company to determine out of pocket expense if they have not yet received this vaccine. Advised may also receive vaccine at local pharmacy or Health Dept. Verbalized acceptance and understanding.  Screening Tests Health Maintenance  Topic Date Due   Zoster Vaccines- Shingrix (1 of 2) Never done    DTaP/Tdap/Td (2 - Td or Tdap) 10/04/2020   COVID-19 Vaccine (4 - 2023-24 season) 01/14/2022   OPHTHALMOLOGY EXAM  03/09/2022   Diabetic kidney evaluation - Urine ACR  08/04/2022   INFLUENZA VACCINE  08/14/2022 (Originally 12/14/2021)   HEMOGLOBIN A1C  02/03/2023   Diabetic kidney evaluation - eGFR measurement  07/21/2023   FOOT EXAM  08/03/2023   Medicare Annual Wellness (  AWV)  08/08/2023   Pneumonia Vaccine 87+ Years old  Completed   DEXA SCAN  Completed   HPV VACCINES  Aged Out    Health Maintenance  Health Maintenance Due  Topic Date Due   Zoster Vaccines- Shingrix (1 of 2) Never done   DTaP/Tdap/Td (2 - Td or Tdap) 10/04/2020   COVID-19 Vaccine (4 - 2023-24 season) 01/14/2022   OPHTHALMOLOGY EXAM  03/09/2022   Diabetic kidney evaluation - Urine ACR  08/04/2022    Colorectal cancer screening: No longer required.   Mammogram status: Completed 12/10/21. Repeat every year  Bone Density status: Ordered 08/08/2022. Pt provided with contact info and advised to call to schedule appt.  Lung Cancer Screening: (Low Dose CT Chest recommended if Age 54-80 years, 30 pack-year currently smoking OR have quit w/in 15years.) does not qualify.     Additional Screening:  Hepatitis C Screening: does not qualify  Vision Screening: Recommended annual ophthalmology exams for early detection of glaucoma and other disorders of the eye. Is the patient up to date with their annual eye exam?  Yes  Who is the provider or what is the name of the office in which the patient attends annual eye exams? Dr.Shapiro If pt is not established with a provider, would they like to be referred to a provider to establish care? No .   Dental Screening: Recommended annual dental exams for proper oral hygiene  Community Resource Referral / Chronic Care Management: CRR required this visit?  No   CCM required this visit?  No      Plan:     I have personally reviewed and noted the following in the patient's  chart:   Medical and social history Use of alcohol, tobacco or illicit drugs  Current medications and supplements including opioid prescriptions. Patient is not currently taking opioid prescriptions. Functional ability and status Nutritional status Physical activity Advanced directives List of other physicians Hospitalizations, surgeries, and ER visits in previous 12 months Vitals Screenings to include cognitive, depression, and falls Referrals and appointments  In addition, I have reviewed and discussed with patient certain preventive protocols, quality metrics, and best practice recommendations. A written personalized care plan for preventive services as well as general preventive health recommendations were provided to patient.     Joanna Dy, MD   08/08/2022

## 2022-08-09 ENCOUNTER — Telehealth: Payer: Self-pay | Admitting: *Deleted

## 2022-08-09 ENCOUNTER — Encounter: Payer: Self-pay | Admitting: *Deleted

## 2022-08-10 ENCOUNTER — Telehealth: Payer: Self-pay

## 2022-08-10 NOTE — Telephone Encounter (Signed)
     Patient  visit on 3/20  at [place] you been able to follow up with your primary care physician? Yes   The patient was or was not able to obtain any needed medicine or equipment. Yes   Are there diet recommendations that you are having difficulty following? Na   Patient expresses understanding of discharge instructions and education provided has no other needs at this time.  Yes     [facility] [phone] [address], [place], [place] [address] Phone: [phone] Email: [email or web address]

## 2022-08-21 ENCOUNTER — Other Ambulatory Visit: Payer: Self-pay | Admitting: Family Medicine

## 2022-08-23 ENCOUNTER — Encounter: Payer: Self-pay | Admitting: Family Medicine

## 2022-08-23 ENCOUNTER — Ambulatory Visit (INDEPENDENT_AMBULATORY_CARE_PROVIDER_SITE_OTHER): Payer: Medicare Other | Admitting: Family Medicine

## 2022-08-23 VITALS — BP 158/84 | HR 83 | Ht 65.0 in | Wt 179.0 lb

## 2022-08-23 DIAGNOSIS — E1122 Type 2 diabetes mellitus with diabetic chronic kidney disease: Secondary | ICD-10-CM | POA: Diagnosis not present

## 2022-08-23 DIAGNOSIS — I152 Hypertension secondary to endocrine disorders: Secondary | ICD-10-CM

## 2022-08-23 DIAGNOSIS — Z794 Long term (current) use of insulin: Secondary | ICD-10-CM

## 2022-08-23 DIAGNOSIS — E1159 Type 2 diabetes mellitus with other circulatory complications: Secondary | ICD-10-CM | POA: Diagnosis not present

## 2022-08-23 DIAGNOSIS — N181 Chronic kidney disease, stage 1: Secondary | ICD-10-CM

## 2022-08-23 DIAGNOSIS — E782 Mixed hyperlipidemia: Secondary | ICD-10-CM

## 2022-08-23 DIAGNOSIS — Z23 Encounter for immunization: Secondary | ICD-10-CM

## 2022-08-23 DIAGNOSIS — I10 Essential (primary) hypertension: Secondary | ICD-10-CM

## 2022-08-23 MED ORDER — BENAZEPRIL HCL 40 MG PO TABS: ORAL_TABLET | ORAL | 3 refills | Status: DC

## 2022-08-23 MED ORDER — CARVEDILOL 6.25 MG PO TABS: 6.2500 mg | ORAL_TABLET | Freq: Two times a day (BID) | ORAL | 4 refills | Status: DC

## 2022-08-23 MED ORDER — UNABLE TO FIND: 0 refills | Status: DC

## 2022-08-23 NOTE — Patient Instructions (Addendum)
F/u in 4 to 6 weeks, re evaluate blood pressure  PLEASE take carvedilol 6.25 mg one tablet every 12 hours, example 8 am and 8 pm for blood pressure Continue amlodipine and benazepril  Nervous feeling is when blood sugar is low, you need to eat on a regular schedule  Please take blood glucose monitoring equipment toDr Nida's office and ask that they show you how to use it , since your dibetic care is there  TdAP today at pharmacy please.( Pls give script)  Next covid vaccine is in 4 months when newone is out since you just had covid  Thanks for choosing [facility], we consider it a privelige to serve you.

## 2022-08-25 ENCOUNTER — Other Ambulatory Visit: Payer: Self-pay

## 2022-08-25 DIAGNOSIS — E1122 Type 2 diabetes mellitus with diabetic chronic kidney disease: Secondary | ICD-10-CM

## 2022-08-25 MED ORDER — HUMULIN 70/30 KWIKPEN (70-30) 100 UNIT/ML ~~LOC~~ SUPN: PEN_INJECTOR | SUBCUTANEOUS | 0 refills | Status: DC

## 2022-08-28 ENCOUNTER — Encounter: Payer: Self-pay | Admitting: Family Medicine

## 2022-08-28 NOTE — Assessment & Plan Note (Addendum)
Managed by [facility], however  reports hypoglycemia , advised to contact Endo for diections as to how to use CBG meter Joanna Brown is reminded of the importance of commitment to daily physical activity for 30 minutes or more, as able and the need to limit carbohydrate intake to 30 to 60 grams per meal to help with blood sugar control.   The need to take medication as prescribed, test blood sugar as directed, and to call between visits if there is a concern that blood sugar is uncontrolled is also discussed.   Joanna Brown is reminded of the importance of daily foot exam, annual eye examination, and good blood sugar, blood pressure and cholesterol control.     Latest Ref Rng & Units 08/03/2022    9:02 AM 07/21/2022    9:33 AM 06/20/2022    8:30 AM 04/25/2022    8:36 AM 12/29/2021    4:43 AM  Diabetic Labs  HbA1c 0.0 - 7.0 % 7.8       Chol 100 - 199 mg/dL   852  778  242   HDL >35 mg/dL   40  42  34   Calc LDL 0 - 99 mg/dL   361  77  59   Triglycerides 0 - 149 mg/dL   443  154  008   Creatinine 0.57 - 1.00 mg/dL  6.76  1.95  0.93        08/23/2022    9:09 AM 08/23/2022    8:44 AM 08/23/2022    8:41 AM 08/03/2022    9:45 AM 08/03/2022    9:43 AM 08/03/2022    8:47 AM 07/21/2022    8:25 AM  BP/Weight  Systolic BP 158 165 171 132 177 136 136  Diastolic BP 84 79 68 115 76 64 78  Wt. (Lbs)   179 180  180.6   BMI   29.79 kg/m2 29.95 kg/m2  30.05 kg/m2       Latest Ref Rng & Units 08/03/2021    9:20 AM 03/09/2021   12:00 AM  Foot/eye exam completion dates  Eye Exam No Retinopathy  No Retinopathy      Foot Form Completion  Done      This result is from an external source.

## 2022-08-28 NOTE — Assessment & Plan Note (Signed)
Hyperlipidemia:Low fat diet discussed and encouraged.   Lipid Panel  Lab Results  Component Value Date   CHOL 194 06/20/2022   HDL 40 06/20/2022   LDLCALC 118 (H) 06/20/2022   TRIG 205 (H) 06/20/2022   CHOLHDL 4.9 (H) 06/20/2022     Uncontrolled , needs to lower fat intake Updated lab needed at/ before next visit.

## 2022-08-28 NOTE — Progress Notes (Signed)
Joanna Brown     MRN: [id number]      DOB: May 22, 1940  HPI Joanna Brown is here for follow up and re-evaluation of chronic medical conditions, medication management and review of any available recent lab and radiology data.  Preventive health is updated, specifically  Cancer screening and Immunization.   Questions or concerns regarding consultations or procedures which the PT has had in the interim are  addressed. The PT denies any adverse reactions to current medications since the last visit.  C/o recurrent episodes of feeling weak and  shaky, and states when she checks her blood sugar it is low Has  new CBG meter but does not know how to use it  ROS Denies recent fever or chills. Denies sinus pressure, nasal congestion, ear pain or sore throat. Denies chest congestion, productive cough or wheezing. Denies chest pains, palpitations and leg swelling Denies abdominal pain, nausea, vomiting,diarrhea or constipation.   Denies dysuria, frequency, hesitancy or incontinence. Denies joint pain, swelling and limitation in mobility. Denies headaches, seizures, numbness, or tingling. C/o anxiety. Denies skin break down or rash.   PE  BP (!) 158/84   Pulse 83   Ht 5\' 5"  (1.651 m)   Wt 179 lb (81.2 kg)   SpO2 97%   BMI 29.79 kg/m   Patient alert and oriented and in no cardiopulmonary distress.  HEENT: No facial asymmetry, EOMI,     Neck supple .  Chest: Clear to auscultation bilaterally.  CVS: S1, S2  systolic murmurs, no S3.Regular rate.  ABD: Soft non tender.   Ext: No edema  MS: Adequate ROM spine, shoulders, hips and knees.  Skin: Intact, no ulcerations or rash noted.  Psych: Good eye contact, normal affect. Memory intact mildly  anxious not  depressed appearing.  CNS: CN 2-12 intact, power,  normal throughout.no focal deficits noted.   Assessment & Plan  Essential hypertension Uncontroled , has not been taking coreg as prescribed DASH diet and commitment to  daily physical activity for a minimum of 30 minutes discussed and encouraged, as a part of hypertension management. The importance of attaining a healthy weight is also discussed.     08/23/2022    9:09 AM 08/23/2022    8:44 AM 08/23/2022    8:41 AM 08/03/2022    9:45 AM 08/03/2022    9:43 AM 08/03/2022    8:47 AM 07/21/2022    8:25 AM  BP/Weight  Systolic BP 158 165 171 132 177 136 136  Diastolic BP 84 79 68 115 76 64 78  Wt. (Lbs)   179 180  180.6   BMI   29.79 kg/m2 29.95 kg/m2  30.05 kg/m2     '  Type 2 diabetes mellitus with stage 1 chronic kidney disease, with long-term current use of insulin (HCC) Managed by End[facility], however  reports hypoglycemia , advised to contact Endo for diections as to how to use CBG meter Joanna Brown is reminded of the importance of commitment to daily physical activity for 30 minutes or more, as able and the need to limit carbohydrate intake to 30 to 60 grams per meal to help with blood sugar control.   The need to take medication as prescribed, test blood sugar as directed, and to call between visits if there is a concern that blood sugar is uncontrolled is also discussed.   Joanna Brown is reminded of the importance of daily foot exam, annual eye examination, and good blood sugar, blood pressure and cholesterol  control.     Latest Ref Rng & Units 08/03/2022    9:02 AM 07/21/2022    9:33 AM 06/20/2022    8:30 AM 04/25/2022    8:36 AM 12/29/2021    4:43 AM  Diabetic Labs  HbA1c 0.0 - 7.0 % 7.8       Chol 100 - 199 mg/dL   161  096  045   HDL >40 mg/dL   40  42  34   Calc LDL 0 - 99 mg/dL   981  77  59   Triglycerides 0 - 149 mg/dL   191  478  295   Creatinine 0.57 - 1.00 mg/dL  6.21  3.08  6.57        08/23/2022    9:09 AM 08/23/2022    8:44 AM 08/23/2022    8:41 AM 08/03/2022    9:45 AM 08/03/2022    9:43 AM 08/03/2022    8:47 AM 07/21/2022    8:25 AM  BP/Weight  Systolic BP 158 165 171 132 177 136 136  Diastolic BP 84 79 68 115 76 64 78  Wt. (Lbs)    179 180  180.6   BMI   29.79 kg/m2 29.95 kg/m2  30.05 kg/m2       Latest Ref Rng & Units 08/03/2021    9:20 AM 03/09/2021   12:00 AM  Foot/eye exam completion dates  Eye Exam No Retinopathy  No Retinopathy      Foot Form Completion  Done      This result is from an external source.        Mixed hyperlipidemia Hyperlipidemia:Low fat diet discussed and encouraged.   Lipid Panel  Lab Results  Component Value Date   CHOL 194 06/20/2022   HDL 40 06/20/2022   LDLCALC 118 (H) 06/20/2022   TRIG 205 (H) 06/20/2022   CHOLHDL 4.9 (H) 06/20/2022     Uncontrolled , needs to lower fat intake Updated lab needed at/ before next visit.

## 2022-08-28 NOTE — Assessment & Plan Note (Signed)
Uncontroled , has not been taking coreg as prescribed DASH diet and commitment to daily physical activity for a minimum of 30 minutes discussed and encouraged, as a part of hypertension management. The importance of attaining a healthy weight is also discussed.     08/23/2022    9:09 AM 08/23/2022    8:44 AM 08/23/2022    8:41 AM 08/03/2022    9:45 AM 08/03/2022    9:43 AM 08/03/2022    8:47 AM 07/21/2022    8:25 AM  BP/Weight  Systolic BP 158 165 171 132 177 136 136  Diastolic BP 84 79 68 115 76 64 78  Wt. (Lbs)   179 180  180.6   BMI   29.79 kg/m2 29.95 kg/m2  30.05 kg/m2     '

## 2022-08-29 DIAGNOSIS — R809 Proteinuria, unspecified: Secondary | ICD-10-CM | POA: Diagnosis not present

## 2022-08-29 DIAGNOSIS — I129 Hypertensive chronic kidney disease with stage 1 through stage 4 chronic kidney disease, or unspecified chronic kidney disease: Secondary | ICD-10-CM | POA: Diagnosis not present

## 2022-08-29 DIAGNOSIS — E1129 Type 2 diabetes mellitus with other diabetic kidney complication: Secondary | ICD-10-CM | POA: Diagnosis not present

## 2022-08-29 DIAGNOSIS — E1122 Type 2 diabetes mellitus with diabetic chronic kidney disease: Secondary | ICD-10-CM | POA: Diagnosis not present

## 2022-08-29 DIAGNOSIS — N189 Chronic kidney disease, unspecified: Secondary | ICD-10-CM | POA: Diagnosis not present

## 2022-08-29 DIAGNOSIS — E876 Hypokalemia: Secondary | ICD-10-CM | POA: Diagnosis not present

## 2022-08-29 DIAGNOSIS — I5032 Chronic diastolic (congestive) heart failure: Secondary | ICD-10-CM | POA: Diagnosis not present

## 2022-09-05 ENCOUNTER — Ambulatory Visit (INDEPENDENT_AMBULATORY_CARE_PROVIDER_SITE_OTHER): Payer: Medicare Other | Admitting: Orthopedic Surgery

## 2022-09-05 DIAGNOSIS — M65331 Trigger finger, right middle finger: Secondary | ICD-10-CM

## 2022-09-05 MED ORDER — METHYLPREDNISOLONE ACETATE 40 MG/ML IJ SUSP
40.0000 mg | Freq: Once | INTRAMUSCULAR | Status: AC
Start: 2022-09-05 — End: 2022-09-05
  Administered 2022-09-05: 40 mg via INTRA_ARTICULAR

## 2022-09-05 NOTE — Progress Notes (Signed)
Chief Complaint  Patient presents with   Follow-up    Recheck on right long middle finger    Right long finger trigger phenomenon status post injection  Pain over the A1 pulley has returned she did get some relief from the injection in February.  It does not lock but it is tender over the A1 pulley.  I gave her the option of surgery versus injection she opted for injection  Trigger finger injection  Diagnosis   Encounter Diagnosis  Name Primary?   Trigger finger, right middle finger Yes    Procedure injection A1 pulley Medications lidocaine 1% 1 mL and Depo-Medrol 40 mg 1 mL Skin prep alcohol and ethyl chloride Verbal consent was obtained Timeout confirmed the injection site  After cleaning the skin with alcohol and anesthetizing the skin with ethyl chloride the A1 pulley was palpated and the injection was performed without complication  Return as needed

## 2022-09-09 ENCOUNTER — Ambulatory Visit (HOSPITAL_COMMUNITY)
Admission: RE | Admit: 2022-09-09 | Discharge: 2022-09-09 | Disposition: A | Discharge status: Home / Self Care | Payer: Medicare Other | Source: Ambulatory Visit | Attending: Gastroenterology | Admitting: Gastroenterology

## 2022-09-09 DIAGNOSIS — K7460 Unspecified cirrhosis of liver: Secondary | ICD-10-CM | POA: Diagnosis not present

## 2022-09-09 DIAGNOSIS — K7469 Other cirrhosis of liver: Secondary | ICD-10-CM | POA: Insufficient documentation

## 2022-09-13 ENCOUNTER — Ambulatory Visit (INDEPENDENT_AMBULATORY_CARE_PROVIDER_SITE_OTHER): Payer: Medicare Other

## 2022-09-13 VITALS — BP 136/72 | Ht 67.0 in | Wt 179.0 lb

## 2022-09-13 DIAGNOSIS — N181 Chronic kidney disease, stage 1: Secondary | ICD-10-CM | POA: Diagnosis not present

## 2022-09-13 DIAGNOSIS — Z794 Long term (current) use of insulin: Secondary | ICD-10-CM

## 2022-09-13 DIAGNOSIS — E1122 Type 2 diabetes mellitus with diabetic chronic kidney disease: Secondary | ICD-10-CM

## 2022-09-13 NOTE — Progress Notes (Signed)
Pt came in today with her Freestyle Libre 3. She was confused about how to apply the sensor. Went over application instructions and pt voiced understanding. The sensor was placed on pts R arm.

## 2022-09-13 NOTE — Patient Instructions (Signed)
Pt notified to call with any questions concerns with her [place] 3

## 2022-09-15 ENCOUNTER — Encounter: Payer: Self-pay | Admitting: Gastroenterology

## 2022-09-15 ENCOUNTER — Ambulatory Visit (INDEPENDENT_AMBULATORY_CARE_PROVIDER_SITE_OTHER): Payer: Medicare Other | Admitting: Gastroenterology

## 2022-09-15 VITALS — BP 155/76 | HR 62 | Temp 97.5°F | Ht 65.0 in | Wt 176.6 lb

## 2022-09-15 DIAGNOSIS — K7469 Other cirrhosis of liver: Secondary | ICD-10-CM

## 2022-09-15 DIAGNOSIS — K5900 Constipation, unspecified: Secondary | ICD-10-CM | POA: Diagnosis not present

## 2022-09-15 NOTE — Progress Notes (Signed)
Gastroenterology Office Note     Primary Care Physician:  Kerri Perches, MD  Primary Gastroenterologist: Dr. Marletta Lor   Chief Complaint   Chief Complaint  Patient presents with   Follow-up    Follow up after U/S     History of Present Illness   Joanna Brown is an 82 y.o. female presenting today in follow-up with a history of  superficial invasive adenocarcinoma arising in background of a sessile serrated polyp in ascending colon in July 2021, surveillance colonoscopy in Jan 2022 but caution  warranted for colonoscopy in future due to age and cardiac condition. Cirrhosis noted on imaging; Hep C screening 2014 negative. Hep A and B negative.  EGD 2022 also performed with Grade 1 esophageal varices.     Constipation: Xray in March 2024 with moderate to severe fecal loading. Recommended Miralax purge. Started working after 4 doses. BM daily. Linzess 290 mcg daily and Miralax each evening.   Recent US without hepatoma. AFP normal in March 2024. MELD 3.0 9 recently. No mental status changes or confusion. She is a high risk candidate for sedation for surveillance EGD, so we are following her conservatively. No jaundice or pruritus. Remains compensated.     GERD: pantoprazole once daily.   She is unsure if she is taking Carvedilol 6.25 mg BID; however, this was recommended as part of her anti-hypertensive regimen. She is not sure if she has this prescription at home.   Past Medical History:  Diagnosis Date   Anginal pain (HCC)    Arthritis    OA   Diabetes mellitus    Dyspnea    Female bladder prolapse    GERD (gastroesophageal reflux disease)    Glaucoma    Hyperlipidemia    Hypertension    echo and stress 4/10 reports on chart, EKG ` LOV 9/12 on chart   S/P TAVR (transcatheter aortic valve replacement) 03/23/2021   Edwards 23mm S3U TF approach with Dr. Clifton James and Dr. Laneta Simmers   Severe aortic stenosis    Thyroid disease     Past Surgical History:  Procedure  Laterality Date   ABDOMINAL HYSTERECTOMY     ANTERIOR AND POSTERIOR REPAIR  04/26/2011   Procedure: ANTERIOR (CYSTOCELE) AND POSTERIOR REPAIR (RECTOCELE);  Surgeon: Martina Sinner, MD;  Location: [facility];  Service: Urology;  Laterality: N/A;   BIOPSY  05/25/2020   Procedure: BIOPSY;  Surgeon: Sherrilyn Rist, MD;  Location: [facility];  Service: Gastroenterology;;   CATARACT EXTRACTION Bilateral    with IOL   CHOLECYSTECTOMY  2009   COLONOSCOPY N/A 12/02/2013   three colon polyps removed, small internal hemorrhoids. Hyperplastic polyps   COLONOSCOPY N/A 11/20/2019   pancolonic diverticulosis, two 10-11 mm polyps in ascending colon, one 5 mm polyp in cecum, ascending colon with superficially invasive adenocarcinoma arising in background of sessile serrated polyps with low and high grade cytologic dysplasia.   COLONOSCOPY     COLONOSCOPY W/ POLYPECTOMY     COLONOSCOPY WITH PROPOFOL N/A 05/25/2020   diverticulosis in right colon, redundant colon. Caution warranted on future colonoscopy in light of age, cardiac condition, challenging anatomy.    DILATION AND CURETTAGE OF UTERUS     pt states this was in the 1970's   ESOPHAGOGASTRODUODENOSCOPY (EGD) WITH PROPOFOL N/A 05/25/2020   Grade 1 esophageal varices, single mucosal nodule in stomach s/p biopsy. (hyperplastic)>    LEFT HEART CATH  09/10/2008   normal coronary arteries, normal LV systolic function, EF 65% (Dr.  H. Lynnea Ferrier)   LYMPH NODE DISSECTION Right 1997   under arm   NM MYOCAR PERF WALL MOTION  2010   dipyridamole - mild-mod in intenstiy perfusion defect in mid anterior, mid anteroseptal wall, EF 70%   OVARY SURGERY     bilateral tumors removed   POLYPECTOMY  11/20/2019   Procedure: POLYPECTOMY;  Surgeon: Corbin Ade, MD;  Location: [facility];  Service: Endoscopy;;  hot and cold snare cecal polyp, and asending polyps x 2   RIGHT/LEFT HEART CATH AND CORONARY ANGIOGRAPHY N/A 10/02/2020   Procedure: RIGHT/LEFT HEART  CATH AND CORONARY ANGIOGRAPHY;  Surgeon: [place], Peter M, MD;  Location: [facility] INVASIVE CV LAB;  Service: Cardiovascular;  Laterality: N/A;   THYROIDECTOMY     THYROIDECTOMY  02/2008   TRANSCATHETER AORTIC VALVE REPLACEMENT, TRANSFEMORAL N/A 03/23/2021   Procedure: TRANSCATHETER AORTIC VALVE REPLACEMENT, TRANSFEMORAL;  Surgeon: Kathleene Hazel, MD;  Location: [facility] INVASIVE CV LAB;  Service: Open Heart Surgery;  Laterality: N/A;   TRANSTHORACIC ECHOCARDIOGRAM  08/2011   EF=>55%, mild conc LVH; trace MR; mild TR; mild-mod AV calcification with mild valvular AV stenosis   VAGINAL PROLAPSE REPAIR  04/26/2011   Procedure: VAGINAL VAULT SUSPENSION;  Surgeon: Martina Sinner, MD;  Location: [facility];  Service: Urology;  Laterality: N/A;  with Graft  10x6    Current Outpatient Medications  Medication Sig Dispense Refill   amLODipine (NORVASC) 10 MG tablet TAKE 1 TABLET(10 MG) BY MOUTH EVERY MORNING 90 tablet 3   Ascorbic Acid (VITAMIN C) 1000 MG tablet Take 1,000 mg by mouth 4 (four) times a week. AT NIGHT     benazepril (LOTENSIN) 40 MG tablet TAKE 1 TABLET(40 MG) BY MOUTH DAILY 90 tablet 3   Blood Glucose Monitoring Suppl (ACCU-CHEK GUIDE) w/Device KIT 1 each by Does not apply route 4 (four) times daily. 1 kit 0   cholecalciferol (VITAMIN D3) 25 MCG (1000 UT) tablet Take 1,000 Units by mouth in the morning.     clopidogrel (PLAVIX) 75 MG tablet Take 1 tablet (75 mg total) by mouth daily. 90 tablet 3   Continuous Blood Gluc Receiver (FREESTYLE LIBRE 2 READER) DEVI As directed 1 each 0   Continuous Blood Gluc Sensor (FREESTYLE LIBRE 2 SENSOR) MISC 1 Piece by Does not apply route every 14 (fourteen) days. 2 each 3   dapagliflozin propanediol (FARXIGA) 5 MG TABS tablet Take 5 mg by mouth daily.     eplerenone (INSPRA) 25 MG tablet Take 12.5 mg by mouth daily.     fenofibrate (TRICOR) 145 MG tablet Take 145 mg by mouth daily.     glipiZIDE (GLUCOTROL XL) 5 MG 24 hr tablet Take 5 mg by mouth daily  with breakfast.     glucose blood (ACCU-CHEK GUIDE) test strip 1 each by Other route in the morning, at noon, in the evening, and at bedtime. Use as instructed 4 x daily. E11.65 150 each 3   insulin isophane & regular human KwikPen (HUMULIN 70/30 KWIKPEN) (70-30) 100 UNIT/ML KwikPen Inject 50 units with breakfast and 40 units with supper when glucose readings are above 90. 30 mL 0   Insulin Pen Needle (B-D ULTRAFINE III SHORT PEN) 31G X 8 MM MISC USE WITH INSULIN TWICE DAILY AS DIRECTED 200 each 2   latanoprost (XALATAN) 0.005 % ophthalmic solution Place 1 drop into both eyes at bedtime.      linaclotide (LINZESS) 290 MCG CAPS capsule Take 1 capsule (290 mcg total) by mouth daily before breakfast.  90 capsule 3   metFORMIN (GLUCOPHAGE) 500 MG tablet Take 1 tablet (500 mg total) by mouth daily with breakfast. 90 tablet 1   Multiple Vitamin (MULTIVITAMIN WITH MINERALS) TABS tablet Take 1 tablet by mouth daily.     Omega-3 Fatty Acids (FISH OIL) 1200 MG CAPS Take 1,200 mg by mouth every morning.     pantoprazole (PROTONIX) 40 MG tablet Take 1 tablet (40 mg total) by mouth daily. 30 minutes breakfast 90 tablet 3   carvedilol (COREG) 6.25 MG tablet Take 1 tablet (6.25 mg total) by mouth 2 (two) times daily with a meal. (Patient not taking: Reported on 09/15/2022) 60 tablet 4   ferrous sulfate 325 (65 FE) MG tablet Take 325 mg by mouth daily with breakfast. (Patient not taking: Reported on 09/15/2022)     UNABLE TO FIND Med Name: TDAP VACCINE 1 each 0   No current facility-administered medications for this visit.    Allergies as of 09/15/2022 - Review Complete 09/15/2022  Allergen Reaction Noted   Senokot wheat bran [wheat]  01/18/2018   Spironolactone  03/24/2014    Family History  Problem Relation Age of Onset   Stomach cancer Mother 76   Heart disease Mother 82       heart disease   Heart disease Father 67       MI   Stroke Maternal Grandfather    Heart attack Paternal Grandfather     Hypertension Brother    Bone cancer Brother    Hypertension Sister    Liver cancer Sister    Hypertension Sister    Hypertension Child    Colon cancer Neg Hx    Colon polyps Neg Hx    Pancreatic cancer Neg Hx    Esophageal cancer Neg Hx    Rectal cancer Neg Hx     Social History   Socioeconomic History   Marital status: Married    Spouse name: [name]   Number of children: 1   Years of education: Trade   Highest education level: 12th grade  Occupational History   Occupation: Retired   Occupation: Insurance account manager  Tobacco Use   Smoking status: Never   Smokeless tobacco: Never  Building services engineer Use: Never used  Substance and Sexual Activity   Alcohol use: Not Currently   Drug use: No   Sexual activity: Yes  Other Topics Concern   Not on file  Social History Narrative   Patient lives at home with spouse. 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 LIVED IN [place] FOR 50 YRS AND CAME BACK TO [place] ~2009. Caffeine Use: 1 cup of coffee daily. HAD ONE CHILD: PASSED 5 YRS. HAVE THREE GRAND-KIDS AND TWO GREAT GRANDS.    Social Determinants of Health   Financial Resource Strain: Low Risk  (08/09/2022)   Overall Financial Resource Strain (CARDIA)    Difficulty of Paying Living Expenses: Not very hard  Food Insecurity: No Food Insecurity (08/02/2021)   Hunger Vital Sign    Worried About Running Out of Food in the Last Year: Never true    Ran Out of Food in the Last Year: Never true  Transportation Needs: No Transportation Needs (08/09/2022)   PRAPARE - Administrator, Civil Service (Medical): No    Lack of Transportation (Non-Medical): No  Physical Activity: Insufficiently Active (08/02/2021)   Exercise Vital Sign    Days of Exercise per Week: 7 days    Minutes of Exercise per Session:  10 min  Stress: No Stress Concern Present (08/02/2021)   [employer] of Occupational Health - Occupational Stress Questionnaire    Feeling of Stress :  Not at all  Social Connections: Socially Integrated (08/02/2021)   Social Connection and Isolation Panel [NHANES]    Frequency of Communication with Friends and Family: More than three times a week    Frequency of Social Gatherings with Friends and Family: Once a week    Attends Religious Services: More than 4 times per year    Active Member of [employer] or Organizations: Yes    Attends Banker Meetings: 1 to 4 times per year    Marital Status: Married  Catering manager Violence: Not At Risk (08/02/2021)   Humiliation, Afraid, Rape, and Kick questionnaire    Fear of Current or Ex-Partner: No    Emotionally Abused: No    Physically Abused: No    Sexually Abused: No     Review of Systems   Gen: Denies any fever, chills, fatigue, weight loss, lack of appetite.  CV: Denies chest pain, heart palpitations, peripheral edema, syncope.  Resp: Denies shortness of breath at rest or with exertion. Denies wheezing or cough.  GI: Denies dysphagia or odynophagia. Denies jaundice, hematemesis, fecal incontinence. GU : Denies urinary burning, urinary frequency, urinary hesitancy MS: Denies joint pain, muscle weakness, cramps, or limitation of movement.  Derm: Denies rash, itching, dry skin Psych: Denies depression, anxiety, memory loss, and confusion Heme: Denies bruising, bleeding, and enlarged lymph nodes.   Physical Exam   BP (!) 155/76   Pulse 62   Temp (!) 97.5 F (36.4 C)   Ht 5\' 5"  (1.651 m)   Wt 176 lb 9.6 oz (80.1 kg)   BMI 29.39 kg/m  General:   Alert and oriented. Pleasant and cooperative. Well-nourished and well-developed.  Head:  Normocephalic and atraumatic. Eyes:  Without icterus Abdomen:  +BS, soft, non-tender and non-distended. No HSM noted. No guarding or rebound. No masses appreciated.  Rectal:  Deferred  Msk:  Symmetrical without gross deformities. Normal posture. Extremities:  Without edema. Neurologic:  Alert and  oriented x4;  grossly normal  neurologically. Skin:  Intact without significant lesions or rashes. Psych:  Alert and cooperative. Normal mood and affect.   Assessment   Joanna Brown is an 82 y.o. female presenting today in follow-up with a history of  superficial invasive adenocarcinoma arising in background of a sessile serrated polyp in ascending colon in July 2021, surveillance colonoscopy in Jan 2022 but caution  warranted for colonoscopy in future due to age and cardiac condition, cirrhosis, GERD, and constipation.    Constipation: doing well now on Linzess 290 mcg daily and Miralax daily.  GERD: controlled on once daily PPI.   Cirrhosis: MELD 3.0 was 9 recently. AFP Normal March 2024. [place] up-to-date. High risk for surveillance EGD. Grade 1 varices. On carvedilol for BP management, which can also work as variceal bleeding prophylaxis.        PLAN   Continue Linzess and Miralax RUQ [place] in 6 months Labs in 6 months I have reached out  to CMA who works with Dr. Lodema Hong as patient is unsure if she has prescription for carvedilol.  Return in 6 months   Gelene Mink, PhD, [facility] Gastroenterology

## 2022-09-15 NOTE — Patient Instructions (Signed)
We will see you in 6 months!  Please call if any other issues with constipation.  I am reaching out to Dr. Anthony Sar nurse regarding the carvedilol prescription.   Have a great weekend!  I enjoyed seeing you again today! At our first visit, I mentioned how I value our relationship and want to provide genuine, compassionate, and quality care. You may receive a survey regarding your visit with me, and I welcome your feedback! Thanks so much for taking the time to complete this. I look forward to seeing you again.   Gelene Mink, PhD, ANP-BC [facility] Gastroenterology

## 2022-09-16 ENCOUNTER — Other Ambulatory Visit: Payer: Self-pay

## 2022-09-16 MED ORDER — CARVEDILOL 6.25 MG PO TABS: 6.2500 mg | ORAL_TABLET | Freq: Two times a day (BID) | ORAL | 2 refills | Status: DC

## 2022-09-20 ENCOUNTER — Other Ambulatory Visit: Payer: Self-pay | Admitting: "Endocrinology

## 2022-10-05 ENCOUNTER — Telehealth: Payer: Self-pay

## 2022-10-05 NOTE — Telephone Encounter (Signed)
Left a message requesting pt return call to the office. 

## 2022-10-07 ENCOUNTER — Encounter: Payer: Self-pay | Admitting: Family Medicine

## 2022-10-07 ENCOUNTER — Ambulatory Visit (INDEPENDENT_AMBULATORY_CARE_PROVIDER_SITE_OTHER): Payer: Medicare Other | Admitting: Family Medicine

## 2022-10-07 VITALS — BP 138/72 | HR 64 | Ht 65.0 in | Wt 175.0 lb

## 2022-10-07 DIAGNOSIS — Z794 Long term (current) use of insulin: Secondary | ICD-10-CM

## 2022-10-07 DIAGNOSIS — E1122 Type 2 diabetes mellitus with diabetic chronic kidney disease: Secondary | ICD-10-CM | POA: Diagnosis not present

## 2022-10-07 DIAGNOSIS — E782 Mixed hyperlipidemia: Secondary | ICD-10-CM

## 2022-10-07 DIAGNOSIS — I639 Cerebral infarction, unspecified: Secondary | ICD-10-CM

## 2022-10-07 DIAGNOSIS — I10 Essential (primary) hypertension: Secondary | ICD-10-CM | POA: Diagnosis not present

## 2022-10-07 DIAGNOSIS — N181 Chronic kidney disease, stage 1: Secondary | ICD-10-CM

## 2022-10-07 NOTE — Patient Instructions (Addendum)
F/U in 4 months, call iof you need me sooner  Continue current meds, blood pressure is fairly well controlled  Fasting lipid ,cmp and eGFR June 11 or after during that week  Reduce salt/ sodium and portion size  and commit to exercise 5 days per week please  Thanks for choosing [facility], we consider it a privelige to serve you.   Nurse please get diabetic eye exam from  Dr Nile Riggs and enter report

## 2022-10-07 NOTE — Progress Notes (Unsigned)
[facility]     MRN: [id number]      DOB: Jun 16, 1940  Chief Complaint  Patient presents with   Hypertension    Patient is here for HTN f/u. L ankle is swollen.     HPI Joanna Brown is here for follow up and re-evaluation of chronic medical conditions, medication management and review of any available recent lab and radiology data.  Preventive health is updated, specifically  Cancer screening and Immunization.   Questions or concerns regarding consultations or procedures which the PT has had in the interim are  addressed. The PT denies any adverse reactions to current medications since the last visit.  ints   ROS Denies recent fever or chills. Denies sinus pressure, nasal congestion, ear pain or sore throat. Denies chest congestion, productive cough or wheezing. Denies chest pains, palpitations and leg swelling Denies abdominal pain, nausea, vomiting,diarrhea or constipation.   Denies dysuria, frequency, hesitancy or incontinence. C/o right ankle swelling and reduced mobnil;ity. Denies headaches, seizures, numbness, or tingling. Denies depression, anxiety or insomnia. Denies skin break down or rash.   PE  BP 138/72   Pulse 64   Ht 5\' 5"  (1.651 m)   Wt 175 lb (79.4 kg)   SpO2 97%   BMI 29.12 kg/m   Patient alert and oriented and in no cardiopulmonary distress.  HEENT: No facial asymmetry, EOMI,     Neck supple .  Chest: Clear to auscultation bilaterally.  CVS: S1, S2 systolic  murmur, no S3.Regular rate.  ABD: Soft non tender.   Ext: No edema  MS: Adequate ROM spine, shoulders, hips and knees.  Skin: Intact, no ulcerations or rash noted.  Psych: Good eye contact, normal affect. Memory intact not anxious or depressed appearing.  CNS: CN 2-12 intact, power,  normal throughout.no focal deficits noted.   Assessment & Plan  Essential hypertension Controlled, no change in medication DASH diet and commitment to daily physical activity for a minimum of 30  minutes discussed and encouraged, as a part of hypertension management. The importance of attaining a healthy weight is also discussed.     10/07/2022   10:26 AM 10/07/2022   10:21 AM 10/07/2022    9:47 AM 10/07/2022    9:43 AM 09/15/2022    2:20 PM 09/15/2022    2:15 PM 09/13/2022    1:27 PM  BP/Weight  Systolic BP 138 144 132 154 155 158 136  Diastolic BP 72 72 72 71 76 72 72  Wt. (Lbs)    175  176.6 179  BMI    29.12 kg/m2  29.39 kg/m2 28.04 kg/m2       Mixed hyperlipidemia Hyperlipidemia:Low fat diet discussed and encouraged.   Lipid Panel  Lab Results  Component Value Date   CHOL 194 06/20/2022   HDL 40 06/20/2022   LDLCALC 118 (H) 06/20/2022   TRIG 205 (H) 06/20/2022   CHOLHDL 4.9 (H) 06/20/2022     Uncontrolled Updated lab needed at/ before next visit.   Type 2 diabetes mellitus with stage 1 chronic kidney disease, with long-term current use of insulin (HCC) Fair control, managed by Endo, report shows improved numbers [name] is reminded of the importance of commitment to daily physical activity for 30 minutes or more, as able and the need to limit carbohydrate intake to 30 to 60 grams per meal to help with blood sugar control.   The need to take medication as prescribed, test blood sugar as directed, and to call between visits  if there is a concern that blood sugar is uncontrolled is also discussed.   [name] is reminded of the importance of daily foot exam, annual eye examination, and good blood sugar, blood pressure and cholesterol control.     Latest Ref Rng & Units 08/03/2022    9:02 AM 07/21/2022    9:33 AM 06/20/2022    8:30 AM 04/25/2022    8:36 AM 12/29/2021    4:43 AM  Diabetic Labs  HbA1c 0.0 - 7.0 % 7.8       Chol 100 - 199 mg/dL   161  096  045   HDL >40 mg/dL   40  42  34   Calc LDL 0 - 99 mg/dL   981  77  59   Triglycerides 0 - 149 mg/dL   191  478  295   Creatinine 0.57 - 1.00 mg/dL  6.21  3.08  6.57        10/07/2022   10:26 AM  10/07/2022   10:21 AM 10/07/2022    9:47 AM 10/07/2022    9:43 AM 09/15/2022    2:20 PM 09/15/2022    2:15 PM 09/13/2022    1:27 PM  BP/Weight  Systolic BP 138 144 132 154 155 158 136  Diastolic BP 72 72 72 71 76 72 72  Wt. (Lbs)    175  176.6 179  BMI    29.12 kg/m2  29.39 kg/m2 28.04 kg/m2      Latest Ref Rng & Units 08/03/2021    9:20 AM 03/09/2021   12:00 AM  Foot/eye exam completion dates  Eye Exam No Retinopathy  No Retinopathy      Foot Form Completion  Done      This result is from an external source.        Stroke (cerebrum) (HCC) Maintained on plavix

## 2022-10-07 NOTE — Assessment & Plan Note (Signed)
Hyperlipidemia:Low fat diet discussed and encouraged.   Lipid Panel  Lab Results  Component Value Date   CHOL 194 06/20/2022   HDL 40 06/20/2022   LDLCALC 118 (H) 06/20/2022   TRIG 205 (H) 06/20/2022   CHOLHDL 4.9 (H) 06/20/2022     Uncontrolled Updated lab needed at/ before next visit.

## 2022-10-07 NOTE — Assessment & Plan Note (Signed)
Controlled, no change in medication DASH diet and commitment to daily physical activity for a minimum of 30 minutes discussed and encouraged, as a part of hypertension management. The importance of attaining a healthy weight is also discussed.     10/07/2022   10:26 AM 10/07/2022   10:21 AM 10/07/2022    9:47 AM 10/07/2022    9:43 AM 09/15/2022    2:20 PM 09/15/2022    2:15 PM 09/13/2022    1:27 PM  BP/Weight  Systolic BP 138 144 132 154 155 158 136  Diastolic BP 72 72 72 71 76 72 72  Wt. (Lbs)    175  176.6 179  BMI    29.12 kg/m2  29.39 kg/m2 28.04 kg/m2

## 2022-10-08 ENCOUNTER — Encounter: Payer: Self-pay | Admitting: Family Medicine

## 2022-10-08 NOTE — Assessment & Plan Note (Signed)
Fair control, managed by Endo, report shows improved numbers Joanna Brown is reminded of the importance of commitment to daily physical activity for 30 minutes or more, as able and the need to limit carbohydrate intake to 30 to 60 grams per meal to help with blood sugar control.   The need to take medication as prescribed, test blood sugar as directed, and to call between visits if there is a concern that blood sugar is uncontrolled is also discussed.   Joanna Brown is reminded of the importance of daily foot exam, annual eye examination, and good blood sugar, blood pressure and cholesterol control.     Latest Ref Rng & Units 08/03/2022    9:02 AM 07/21/2022    9:33 AM 06/20/2022    8:30 AM 04/25/2022    8:36 AM 12/29/2021    4:43 AM  Diabetic Labs  HbA1c 0.0 - 7.0 % 7.8       Chol 100 - 199 mg/dL   161  096  045   HDL >40 mg/dL   40  42  34   Calc LDL 0 - 99 mg/dL   981  77  59   Triglycerides 0 - 149 mg/dL   191  478  295   Creatinine 0.57 - 1.00 mg/dL  6.21  3.08  6.57        10/07/2022   10:26 AM 10/07/2022   10:21 AM 10/07/2022    9:47 AM 10/07/2022    9:43 AM 09/15/2022    2:20 PM 09/15/2022    2:15 PM 09/13/2022    1:27 PM  BP/Weight  Systolic BP 138 144 132 154 155 158 136  Diastolic BP 72 72 72 71 76 72 72  Wt. (Lbs)    175  176.6 179  BMI    29.12 kg/m2  29.39 kg/m2 28.04 kg/m2      Latest Ref Rng & Units 08/03/2021    9:20 AM 03/09/2021   12:00 AM  Foot/eye exam completion dates  Eye Exam No Retinopathy  No Retinopathy      Foot Form Completion  Done      This result is from an external source.

## 2022-10-08 NOTE — Assessment & Plan Note (Signed)
Maintained on plavix 

## 2022-10-17 DIAGNOSIS — E876 Hypokalemia: Secondary | ICD-10-CM | POA: Diagnosis not present

## 2022-10-17 DIAGNOSIS — E1122 Type 2 diabetes mellitus with diabetic chronic kidney disease: Secondary | ICD-10-CM | POA: Diagnosis not present

## 2022-10-17 DIAGNOSIS — R809 Proteinuria, unspecified: Secondary | ICD-10-CM | POA: Diagnosis not present

## 2022-10-17 DIAGNOSIS — D631 Anemia in chronic kidney disease: Secondary | ICD-10-CM | POA: Diagnosis not present

## 2022-10-17 DIAGNOSIS — Z79899 Other long term (current) drug therapy: Secondary | ICD-10-CM | POA: Diagnosis not present

## 2022-10-26 DIAGNOSIS — E663 Overweight: Secondary | ICD-10-CM | POA: Diagnosis not present

## 2022-10-26 DIAGNOSIS — I129 Hypertensive chronic kidney disease with stage 1 through stage 4 chronic kidney disease, or unspecified chronic kidney disease: Secondary | ICD-10-CM | POA: Diagnosis not present

## 2022-10-26 DIAGNOSIS — E1122 Type 2 diabetes mellitus with diabetic chronic kidney disease: Secondary | ICD-10-CM | POA: Diagnosis not present

## 2022-10-26 DIAGNOSIS — I5032 Chronic diastolic (congestive) heart failure: Secondary | ICD-10-CM | POA: Diagnosis not present

## 2022-11-08 ENCOUNTER — Ambulatory Visit (INDEPENDENT_AMBULATORY_CARE_PROVIDER_SITE_OTHER): Payer: Medicare Other | Admitting: Podiatry

## 2022-11-08 ENCOUNTER — Encounter: Payer: Self-pay | Admitting: Podiatry

## 2022-11-08 VITALS — BP 156/70 | HR 57

## 2022-11-08 DIAGNOSIS — M79674 Pain in right toe(s): Secondary | ICD-10-CM | POA: Diagnosis not present

## 2022-11-08 DIAGNOSIS — M79675 Pain in left toe(s): Secondary | ICD-10-CM

## 2022-11-08 DIAGNOSIS — E1142 Type 2 diabetes mellitus with diabetic polyneuropathy: Secondary | ICD-10-CM

## 2022-11-08 DIAGNOSIS — B351 Tinea unguium: Secondary | ICD-10-CM | POA: Diagnosis not present

## 2022-11-10 ENCOUNTER — Ambulatory Visit (INDEPENDENT_AMBULATORY_CARE_PROVIDER_SITE_OTHER): Payer: Medicare Other | Admitting: "Endocrinology

## 2022-11-10 ENCOUNTER — Encounter: Payer: Self-pay | Admitting: "Endocrinology

## 2022-11-10 VITALS — BP 146/64 | HR 72 | Ht 65.0 in | Wt 178.8 lb

## 2022-11-10 DIAGNOSIS — I10 Essential (primary) hypertension: Secondary | ICD-10-CM | POA: Diagnosis not present

## 2022-11-10 DIAGNOSIS — N181 Chronic kidney disease, stage 1: Secondary | ICD-10-CM

## 2022-11-10 DIAGNOSIS — E782 Mixed hyperlipidemia: Secondary | ICD-10-CM | POA: Diagnosis not present

## 2022-11-10 DIAGNOSIS — E1122 Type 2 diabetes mellitus with diabetic chronic kidney disease: Secondary | ICD-10-CM

## 2022-11-10 DIAGNOSIS — Z794 Long term (current) use of insulin: Secondary | ICD-10-CM

## 2022-11-10 LAB — POCT GLYCOSYLATED HEMOGLOBIN (HGB A1C): HbA1c, POC (controlled diabetic range): 6.8 % (ref 0.0–7.0)

## 2022-11-10 MED ORDER — HUMULIN 70/30 KWIKPEN (70-30) 100 UNIT/ML ~~LOC~~ SUPN: PEN_INJECTOR | SUBCUTANEOUS | 0 refills | Status: DC

## 2022-11-10 NOTE — Patient Instructions (Signed)

## 2022-11-10 NOTE — Progress Notes (Signed)
11/10/2022                     Endocrinology follow-up note   Subjective:    Patient ID: Joanna Brown, female    DOB: November 26, 1940. She is being seen in follow-up for uncontrolled type 2 diabetes, complicated by CKD, hyperlipidemia, hypertension.     PMD:   Kerri Perches, MD  Past Medical History:  Diagnosis Date   Anginal pain (HCC)    Arthritis    OA   Diabetes mellitus    Dyspnea    Female bladder prolapse    GERD (gastroesophageal reflux disease)    Glaucoma    Hyperlipidemia    Hypertension    echo and stress 4/10 reports on chart, EKG ` LOV 9/12 on chart   S/P TAVR (transcatheter aortic valve replacement) 03/23/2021   Edwards 23mm S3U TF approach with Dr. Clifton James and Dr. Laneta Simmers   Severe aortic stenosis    Thyroid disease    Past Surgical History:  Procedure Laterality Date   ABDOMINAL HYSTERECTOMY     ANTERIOR AND POSTERIOR REPAIR  04/26/2011   Procedure: ANTERIOR (CYSTOCELE) AND POSTERIOR REPAIR (RECTOCELE);  Surgeon: Martina Sinner, MD;  Location: [facility];  Service: Urology;  Laterality: N/A;   BIOPSY  05/25/2020   Procedure: BIOPSY;  Surgeon: Sherrilyn Rist, MD;  Location: [facility];  Service: Gastroenterology;;   CATARACT EXTRACTION Bilateral    with IOL   CHOLECYSTECTOMY  2009   COLONOSCOPY N/A 12/02/2013   three colon polyps removed, small internal hemorrhoids. Hyperplastic polyps   COLONOSCOPY N/A 11/20/2019   pancolonic diverticulosis, two 10-11 mm polyps in ascending colon, one 5 mm polyp in cecum, ascending colon with superficially invasive adenocarcinoma arising in background of sessile serrated polyps with low and high grade cytologic dysplasia.   COLONOSCOPY     COLONOSCOPY W/ POLYPECTOMY     COLONOSCOPY WITH PROPOFOL N/A 05/25/2020   diverticulosis in right colon, redundant colon. Caution warranted on future colonoscopy in light of age, cardiac condition, challenging anatomy.    DILATION AND CURETTAGE OF UTERUS     pt states this  was in the 1970's   ESOPHAGOGASTRODUODENOSCOPY (EGD) WITH PROPOFOL N/A 05/25/2020   Grade 1 esophageal varices, single mucosal nodule in stomach s/p biopsy. (hyperplastic)>    LEFT HEART CATH  09/10/2008   normal coronary arteries, normal LV systolic function, EF 65% (Dr. Claudia Desanctis)   LYMPH NODE DISSECTION Right 1997   under arm   NM MYOCAR PERF WALL MOTION  2010   dipyridamole - mild-mod in intenstiy perfusion defect in mid anterior, mid anteroseptal wall, EF 70%   OVARY SURGERY     bilateral tumors removed   POLYPECTOMY  11/20/2019   Procedure: POLYPECTOMY;  Surgeon: Corbin Ade, MD;  Location: [facility];  Service: Endoscopy;;  hot and cold snare cecal polyp, and asending polyps x 2   RIGHT/LEFT HEART CATH AND CORONARY ANGIOGRAPHY N/A 10/02/2020   Procedure: RIGHT/LEFT HEART CATH AND CORONARY ANGIOGRAPHY;  Surgeon: [place], Peter M, MD;  Location: [facility] INVASIVE CV LAB;  Service: Cardiovascular;  Laterality: N/A;   THYROIDECTOMY     THYROIDECTOMY  02/2008   TRANSCATHETER AORTIC VALVE REPLACEMENT, TRANSFEMORAL N/A 03/23/2021   Procedure: TRANSCATHETER AORTIC VALVE REPLACEMENT, TRANSFEMORAL;  Surgeon: Kathleene Hazel, MD;  Location: [facility]VAS[facility] CV LAB;  Service: Open Heart Surgery;  Laterality: N/A;   TRANSTHORACIC ECHOCARDIOGRAM  08/2011   EF=>55%, mild conc LVH; trace MR; mild  TR; mild-mod AV calcification with mild valvular AV stenosis   VAGINAL PROLAPSE REPAIR  04/26/2011   Procedure: VAGINAL VAULT SUSPENSION;  Surgeon: Martina Sinner, MD;  Location: [facility];  Service: Urology;  Laterality: N/A;  with Graft  10x6   Social History   Socioeconomic History   Marital status: Married    Spouse name: [name]   Number of children: 1   Years of education: Trade   Highest education level: 12th grade  Occupational History   Occupation: Retired   Occupation: Insurance account manager  Tobacco Use   Smoking status: Never   Smokeless tobacco: Never  Building services engineer Use:  Never used  Substance and Sexual Activity   Alcohol use: Not Currently   Drug use: No   Sexual activity: Yes  Other Topics Concern   Not on file  Social History Narrative   Patient lives at home with spouse. 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 LIVED IN [place] FOR [place] YRS AND CAME BACK TO  ~2009. Caffeine Use: 1 cup of coffee daily. HAD ONE CHILD: PASSED 5 YRS. HAVE THREE GRAND-KIDS AND TWO GREAT GRANDS.    Social Determinants of Health   Financial Resource Strain: Low Risk  (08/09/2022)   Overall Financial Resource Strain (CARDIA)    Difficulty of Paying Living Expenses: Not very hard  Food Insecurity: No Food Insecurity (08/02/2021)   Hunger Vital Sign    Worried About Running Out of Food in the Last Year: Never true    Ran Out of Food in the Last Year: Never true  Transportation Needs: No Transportation Needs (08/09/2022)   PRAPARE - Administrator, Civil Service (Medical): No    Lack of Transportation (Non-Medical): No  Physical Activity: Insufficiently Active (08/02/2021)   Exercise Vital Sign    Days of Exercise per Week: 7 days    Minutes of Exercise per Session: 10 min  Stress: No Stress Concern Present (08/02/2021)   [employer] of Occupational Health - Occupational Stress Questionnaire    Feeling of Stress : Not at all  Social Connections: Socially Integrated (08/02/2021)   Social Connection and Isolation Panel [NHANES]    Frequency of Communication with Friends and Family: More than three times a week    Frequency of Social Gatherings with Friends and Family: Once a week    Attends Religious Services: More than 4 times per year    Active Member of [employer] or Organizations: Yes    Attends Banker Meetings: 1 to 4 times per year    Marital Status: Married   Outpatient Encounter Medications as of 11/10/2022  Medication Sig   amLODipine (NORVASC) 10 MG tablet TAKE 1 TABLET(10 MG) BY MOUTH EVERY MORNING   Ascorbic Acid  (VITAMIN C) 1000 MG tablet Take 1,000 mg by mouth 4 (four) times a week. AT NIGHT   benazepril (LOTENSIN) 40 MG tablet TAKE 1 TABLET(40 MG) BY MOUTH DAILY   Blood Glucose Monitoring Suppl (ACCU-CHEK GUIDE) w/Device KIT 1 each by Does not apply route 4 (four) times daily.   carvedilol (COREG) 6.25 MG tablet Take 1 tablet (6.25 mg total) by mouth 2 (two) times daily with a meal.   cholecalciferol (VITAMIN D3) 25 MCG (1000 UT) tablet Take 1,000 Units by mouth in the morning.   clopidogrel (PLAVIX) 75 MG tablet Take 1 tablet (75 mg total) by mouth daily.   Continuous Blood Gluc Receiver (FREESTYLE LIBRE 2 READER) DEVI As directed  Continuous Blood Gluc Sensor (FREESTYLE LIBRE 2 SENSOR) MISC 1 Piece by Does not apply route every 14 (fourteen) days.   dapagliflozin propanediol (FARXIGA) 5 MG TABS tablet Take 5 mg by mouth daily.   eplerenone (INSPRA) 25 MG tablet Take 12.5 mg by mouth daily.   fenofibrate (TRICOR) 145 MG tablet Take 145 mg by mouth daily.   glucose blood (ACCU-CHEK GUIDE) test strip 1 each by Other route in the morning, at noon, in the evening, and at bedtime. Use as instructed 4 x daily. E11.65   insulin isophane & regular human KwikPen (HUMULIN 70/30 KWIKPEN) (70-30) 100 UNIT/ML KwikPen Inject 40 units with breakfast and 40 units with supper when glucose readings are above 90.   Insulin Pen Needle (B-D ULTRAFINE III SHORT PEN) 31G X 8 MM MISC USE WITH INSULIN TWICE DAILY AS DIRECTED   latanoprost (XALATAN) 0.005 % ophthalmic solution Place 1 drop into both eyes at bedtime.    linaclotide (LINZESS) 290 MCG CAPS capsule Take 1 capsule (290 mcg total) by mouth daily before breakfast.   metFORMIN (GLUCOPHAGE) 500 MG tablet Take 1 tablet (500 mg total) by mouth daily with breakfast.   Multiple Vitamin (MULTIVITAMIN WITH MINERALS) TABS tablet Take 1 tablet by mouth daily.   Omega-3 Fatty Acids (FISH OIL) 1200 MG CAPS Take 1,200 mg by mouth every morning.   pantoprazole (PROTONIX) 40 MG  tablet Take 1 tablet (40 mg total) by mouth daily. 30 minutes breakfast   UNABLE TO FIND Med Name: TDAP VACCINE   [DISCONTINUED] glipiZIDE (GLUCOTROL XL) 5 MG 24 hr tablet TAKE 1 TABLET(5 MG) BY MOUTH DAILY WITH BREAKFAST   [DISCONTINUED] insulin isophane & regular human KwikPen (HUMULIN 70/30 KWIKPEN) (70-30) 100 UNIT/ML KwikPen Inject 50 units with breakfast and 40 units with supper when glucose readings are above 90. (Patient taking differently: Inject 40 units with breakfast and 40 units with supper when glucose readings are above 90.)   No facility-administered encounter medications on file as of 11/10/2022.   ALLERGIES: Allergies  Allergen Reactions   Senokot Wheat Bran [Wheat]     ABDOMINAL CRAMPS   Spironolactone     Stomach problems, vision changes    VACCINATION STATUS: Immunization History  Administered Date(s) Administered   Hepatitis B, ADULT 09/27/2021   Moderna SARS-COV2 Booster Vaccination 11/19/2020   Moderna Sars-Covid-2 Vaccination 09/13/2019, 10/12/2019, 04/29/2020   Pneumococcal Conjugate-13 12/11/2013   Pneumococcal Polysaccharide-23 01/13/2010   Tdap 10/05/2010    Diabetes She presents for her follow-up diabetic visit. She has type 2 diabetes mellitus. Onset time: She was diagnosed at approximate age of 41 years. Her disease course has been improving. There are no hypoglycemic associated symptoms. Pertinent negatives for hypoglycemia include no confusion, headaches, pallor or seizures. Pertinent negatives for diabetes include no blurred vision, no chest pain, no fatigue, no polydipsia, no polyphagia and no polyuria. There are no hypoglycemic complications. Symptoms are improving. Diabetic complications include nephropathy and retinopathy. Risk factors for coronary artery disease include dyslipidemia, diabetes mellitus, obesity and sedentary lifestyle. Her weight is fluctuating minimally. She is following a generally unhealthy diet. When asked about meal planning, she  reported none. She has had a previous visit with a dietitian. She rarely participates in exercise. Her home blood glucose trend is decreasing steadily. Her breakfast blood glucose range is generally 140-180 mg/dl. Her dinner blood glucose range is generally 140-180 mg/dl. Her bedtime blood glucose range is generally 140-180 mg/dl. Her overall blood glucose range is 140-180 mg/dl. (Ms. Hofmeister presents with significantly  improved glycemic profile and point-of-care A1c of 6.8%.  She is benefiting from her CGM showing 72% time in range, 20% level 1 hyperglycemia, 4% able to hyperglycemia.  She does have 4% level 1 hypoglycemia.  Her average blood glucose is 150 for the last 14 days.    ) Eye exam is current.  Hyperlipidemia This is a chronic problem. The current episode started more than 1 year ago. The problem is uncontrolled. Exacerbating diseases include diabetes and obesity. Pertinent negatives include no chest pain, myalgias or shortness of breath. Current antihyperlipidemic treatment includes statins. Risk factors for coronary artery disease include dyslipidemia, diabetes mellitus, hypertension, obesity, a sedentary lifestyle and post-menopausal.  Hypertension This is a chronic problem. The current episode started more than 1 year ago. Pertinent negatives include no blurred vision, chest pain, headaches, palpitations or shortness of breath. Risk factors for coronary artery disease include dyslipidemia, diabetes mellitus, obesity and sedentary lifestyle. Hypertensive end-organ damage includes retinopathy.   Nodular goiter She Patient is being seen today for a new issue with her thyroid.  She was found to have 2.6 cm nodule on right lobe of her thyroid while undergoing CT scan of the chest during cancer surveillance.  She has previous history of left hemithyroidectomy approximately 10 years ago for multinodular goiter.  She is not on thyroid hormone supplement or replacement.  She denies dysphagia,  shortness of breath, nor voice change. -Her subsequent thyroid ultrasound confirms multinodular right lobe of the thyroid with surgically absent left lobe.  Her right thyroid lobe nodule did not meet criteria for biopsy.  Review of systems  Constitutional: + Minimally fluctuating body weight, current  Body mass index is 29.75 kg/m. , no fatigue, no subjective hyperthermia, no subjective hypothermia    Objective:    BP (!) 146/64 Comment: R arm with manuel cuff  Pulse 72   Ht 5\' 5"  (1.651 m)   Wt 178 lb 12.8 oz (81.1 kg)   BMI 29.75 kg/m   Wt Readings from Last 3 Encounters:  11/10/22 178 lb 12.8 oz (81.1 kg)  10/07/22 175 lb (79.4 kg)  09/15/22 176 lb 9.6 oz (80.1 kg)    Physical Exam- Limited  Constitutional:  Body mass index is 29.75 kg/m. , not in acute distress, normal state of mind    CMP ( most recent) CMP     Component Value Date/Time   NA 142 07/21/2022 0933   K 3.9 07/21/2022 0933   CL 102 07/21/2022 0933   CO2 22 07/21/2022 0933   GLUCOSE 144 (H) 07/21/2022 0933   GLUCOSE 223 (H) 12/28/2021 1624   BUN 22 07/21/2022 0933   CREATININE 1.21 (H) 07/21/2022 0933   CREATININE 1.11 (H) 03/02/2020 0848   CALCIUM 10.4 (H) 07/21/2022 0933   PROT 7.2 07/21/2022 0933   ALBUMIN 4.3 07/21/2022 0933   AST 21 07/21/2022 0933   ALT 18 07/21/2022 0933   ALKPHOS 90 07/21/2022 0933   BILITOT 0.3 07/21/2022 0933   GFRNONAA 43 (L) 12/28/2021 1624   GFRNONAA 57 (L) 07/30/2019 0718   GFRAA 66 07/30/2019 0718    Diabetic Labs (most recent): Lab Results  Component Value Date   HGBA1C 6.8 11/10/2022   HGBA1C 7.8 (A) 08/03/2022   HGBA1C 7.5 (A) 12/02/2021   MICROALBUR 6.4 07/30/2019   MICROALBUR 11.5 06/28/2018   MICROALBUR 10.3 (H) 07/18/2017    Lipid Panel     Component Value Date/Time   CHOL 194 06/20/2022 0830   TRIG 205 (H) 06/20/2022 0830  HDL 40 06/20/2022 0830   CHOLHDL 4.9 (H) 06/20/2022 0830   CHOLHDL 4.2 12/29/2021 0443   VLDL 49 (H) 12/29/2021  0443   LDLCALC 118 (H) 06/20/2022 0830   LDLCALC 99 07/30/2019 0718   Incidental finding on chest CT on December 05, 2019 Enlarged  multinodular remnant right thyroid with dominant 2.6 cm hypodense Nodule.   Thyroid ultrasound on December 19, 2019: Right lobe 4.4 cm, left lobe absent surgically. No adenopathy   IMPRESSION: Surgical changes of left hemithyroidectomy.  Multinodular thyroid.  3.9 cm and 1.6 cm cystic/almost completely cystic nodules with smooth margins, No thyroid nodule meets criteria for biopsy or surveillance, as designated by the newly established ACR TI-RADS criteria.   Assessment & Plan:    1. Type 2 diabetes mellitus with stage 1 chronic kidney disease, with long-term current use of insulin (HCC).  Ms. Fredenburg presents with significantly improved glycemic profile and point-of-care A1c of 6.8%.  She is benefiting from her CGM showing 72% time in range, 20% level 1 hyperglycemia, 4% able to hyperglycemia.  She does have 4% level 1 hypoglycemia.  Her average blood glucose is 150 for the last 14 days.       Recent labs reviewed, showing improving renal function.     Her diabetes is complicated by CKD obesity/sedentary life and patient remains at a high risk for more acute and chronic complications of diabetes which include CAD, CVA, CKD, retinopathy, and neuropathy. These are all discussed in detail with the patient.  - I have counseled the patient on diet management and weight loss, by adopting a carbohydrate restricted/protein rich diet.  -She still admits to dietary indiscretions including consumption of sweets and sweetened beverages.  - she acknowledges that there is a room for improvement in her food and drink choices. - Suggestion is made for her to avoid simple carbohydrates  from her diet including Cakes, Sweet Desserts, Ice Cream, Soda (diet and regular), Sweet Tea, Candies, Chips, Cookies, Store Bought Juices, Alcohol , Artificial Sweeteners,  Coffee Creamer,  and "Sugar-free" Products, Lemonade. This will help patient to have more stable blood glucose profile and potentially avoid unintended weight gain.  The following Lifestyle Medicine recommendations according to American College of Lifestyle Medicine  [facility]) were discussed and and offered to patient and she  agrees to start the journey:  A. Whole Foods, Plant-Based Nutrition comprising of fruits and vegetables, plant-based proteins, whole-grain carbohydrates was discussed in detail with the patient.   A list for source of those nutrients were also provided to the patient.  Patient will use only water or unsweetened tea for hydration. B.  The need to stay away from risky substances including alcohol, smoking; obtaining 7 to 9 hours of restorative sleep, at least 150 minutes of moderate intensity exercise weekly, the importance of healthy social connections,  and stress management techniques were discussed. C.  A full color page of  Calorie density of various food groups per pound showing examples of each food groups was provided to the patient.   - I encouraged the patient to switch to  unprocessed or minimally processed complex starch and increased protein intake (animal or plant source), fruits, and vegetables.  - Patient is advised to stick to a routine mealtimes to eat 3 meals  a day and avoid unnecessary snacks ( to snack only to correct hypoglycemia).   - I have approached patient with the following individualized plan to manage diabetes and patient agrees:     -Since she  presents with controlled glycemic profile, she would not need any further escalation in her insulin doses.  She is advised to decrease  Humulin 70/30 to 40 units at breakfast and 40 units at supper  when Premeal blood glucose readings are above 90 mg per DL.   -She is encouraged to start monitoring blood glucose 4 times a day-before meals and at bedtime . -She is benefiting from her CGM, advised to use it continuously.     -Patient is encouraged to call clinic for blood glucose levels less than 70 or above 200 mg /dl. -She is advised to continue Farxiga 5 mg p.o. daily at breakfast.   She was advised to discontinue glipizide.  However, she will continue to benefit from low-dose metformin-500 mg p.o. daily at breakfast.  - Patient specific target  A1c;  LDL, HDL, Triglycerides, were discussed in detail.  2.  Nodular goiter: Patient with remote past history of left hemithyroidectomy for multinodular goiter.  She did not require thyroid hormone supplement.  She was incidentally found to have 2.6 cm nodule in the right lobe. -Her subsequent dedicated thyroid ultrasound confirmed 2 nodules on the right lobe of her thyroid which did not meet any criteria for biopsy. -She will not need any antithyroid intervention at this time.  Her TFTs are consistent with euthyroid presentation.  She will be considered for thyroid function test as well as repeat thyroid ultrasound in a year.    3) BP/HTN:  -Her blood pressure is controlled to near target.   She is advised to continue her current blood pressure medications including benazepril 40 mg p.o. daily at breakfast.    4) Lipids/HPL: Her recent lipid panel showed worsening LDL to 118 from 59.  Prior to her last labs, she admitted inconsistency taking pravastatin.  She was advised to be consistent, confirms to me that she is taking it regularly this time.  She will have repeat fasting lipid panel on subsequent visits.    She has no side effects from it.  Whole food plant-based diet was discussed with her.       5)  Weight/Diet: Her BMI is 29.75---she is a candidate for moderate weight loss.  CDE Consult has been initiated , exercise, and detailed carbohydrates information provided.  6) Chronic Care/Health Maintenance:  -Patient is on ACEI/ARB and Statin medications and encouraged to continue to follow up with Ophthalmology, Podiatrist at least yearly or according to  recommendations, and advised to  stay away from smoking. I have recommended yearly flu vaccine and pneumonia vaccination at least every 5 years; moderate intensity exercise for up to 150 minutes weekly; and  sleep for at least 7 hours a day.  Diabetes foot exam is normal today.  POC ABI for PAD screen was normal on June 10, 2020.  This test will be repeated in January 2027, or sooner if needed.     - I advised patient to maintain close follow up with Kerri Perches, MD for primary care needs.   I spent  26  minutes in the care of the patient today including review of labs from CMP, Lipids, Thyroid Function, Hematology (current and previous including abstractions from other facilities); face-to-face time discussing  her blood glucose readings/logs, discussing hypoglycemia and hyperglycemia episodes and symptoms, medications doses, her options of short and long term treatment based on the latest standards of care / guidelines;  discussion about incorporating lifestyle medicine;  and documenting the encounter. Risk reduction counseling performed per USPSTF guidelines to  reduce cardiovascular risk factors.     Please refer to Patient Instructions for Blood Glucose Monitoring and Insulin/Medications Dosing Guide"  in media tab for additional information. Please  also refer to " Patient Self Inventory" in the Media  tab for reviewed elements of pertinent patient history.  Gwyneth Revels Mcgurk participated in the discussions, expressed understanding, and voiced agreement with the above plans.  All questions were answered to her satisfaction. she is encouraged to contact clinic should she have any questions or concerns prior to her return visit.    Follow up plan: - Return in about 4 months (around 03/12/2023) for F/U with Pre-visit Labs, Meter/CGM/Logs, A1c here.  Marquis Lunch, MD Phone: [phone]  Fax: [phone]   This note was partially dictated with voice recognition software. Similar  sounding words can be transcribed inadequately or may not  be corrected upon review.  11/10/2022, 1:29 PM

## 2022-11-13 NOTE — Progress Notes (Signed)
  Subjective:  Patient ID: Joanna Brown, female    DOB: [birth year],  MRN: [id number]  Joanna Brown presents to clinic today for: at risk foot care with history of diabetic neuropathy and painful thick toenails that are difficult to trim. Pain interferes with ambulation. Aggravating factors include wearing enclosed shoe gear. Pain is relieved with periodic professional debridement.  Chief Complaint  Patient presents with   Diabetes    "I got some bad toenails."  Dr. Syliva Overman - 10/07/2022,  glucose - 97     PCP is Kerri Perches, MD.  Allergies  Allergen Reactions   Senokot Wheat Bran [Wheat]     ABDOMINAL CRAMPS   Spironolactone     Stomach problems, vision changes     Review of Systems: Negative except as noted in the HPI.  Objective: No changes noted in today's physical examination. Vitals:   11/08/22 0909  BP: (!) 156/70  Pulse: (!) 57    Joanna Brown is a pleasant 82 y.o. female in NAD. AAO x 3.  Vascular Examination: Capillary refill time <3 seconds b/l LE. Palpable pedal pulses b/l LE. No pedal edema b/l. Skin temperature gradient WNL b/l. No varicosities b/l. Pedal hair sparse..  Dermatological Examination: Pedal skin with normal turgor, texture and tone b/l. No open wounds. No interdigital macerations b/l. Toenails 1-5 b/l thickened, discolored, dystrophic with subungual debris. There is pain on palpation to dorsal aspect of nailplates. No hyperkeratotic nor porokeratotic lesions present on today's visit.Marland Kitchen  Neurological Examination: Protective sensation intact with 10 gram monofilament b/l LE. Vibratory sensation intact b/l LE.   Musculoskeletal Examination: Normal muscle strength 5/5 to all lower extremity muscle groups bilaterally. Hallux valgus with bunion deformity noted b/l lower extremities.. No pain, crepitus or joint limitation noted with ROM b/l LE.  Patient ambulates independently without assistive aids.     Latest Ref Rng & Units  11/10/2022   10:36 AM 08/03/2022    9:02 AM 12/02/2021    9:25 AM  Hemoglobin A1C  Hemoglobin-A1c 0.0 - 7.0 % 6.8  7.8  7.5    Assessment/Plan: 1. Pain due to onychomycosis of toenails of both feet   2. Diabetic peripheral neuropathy associated with type 2 diabetes mellitus (HCC)     -Patient was evaluated and treated. All patient's and/or POA's questions/concerns answered on today's visit. -Patient to continue soft, supportive shoe gear daily. -Toenails 1-5 b/l were debrided in length and girth with sterile nail nippers and dremel without iatrogenic bleeding.  -Patient/POA to call should there be question/concern in the interim.   Return in about 3 months (around 02/08/2023).  Freddie Breech, DPM

## 2022-11-27 ENCOUNTER — Other Ambulatory Visit: Payer: Self-pay | Admitting: "Endocrinology

## 2022-12-26 ENCOUNTER — Telehealth: Payer: Self-pay

## 2022-12-26 DIAGNOSIS — H401131 Primary open-angle glaucoma, bilateral, mild stage: Secondary | ICD-10-CM | POA: Diagnosis not present

## 2022-12-26 NOTE — Telephone Encounter (Signed)
Left a message requesting pt return call to the office. 

## 2022-12-27 ENCOUNTER — Telehealth: Payer: Self-pay | Admitting: Family Medicine

## 2022-12-27 DIAGNOSIS — M069 Rheumatoid arthritis, unspecified: Secondary | ICD-10-CM | POA: Diagnosis not present

## 2022-12-27 DIAGNOSIS — Z832 Family history of diseases of the blood and blood-forming organs and certain disorders involving the immune mechanism: Secondary | ICD-10-CM | POA: Diagnosis not present

## 2022-12-27 DIAGNOSIS — E088 Diabetes mellitus due to underlying condition with unspecified complications: Secondary | ICD-10-CM | POA: Diagnosis not present

## 2022-12-27 DIAGNOSIS — Z1379 Encounter for other screening for genetic and chromosomal anomalies: Secondary | ICD-10-CM | POA: Diagnosis not present

## 2022-12-30 ENCOUNTER — Telehealth: Payer: Self-pay | Admitting: Family Medicine

## 2022-12-30 ENCOUNTER — Other Ambulatory Visit: Payer: Self-pay | Admitting: "Endocrinology

## 2022-12-30 NOTE — Telephone Encounter (Signed)
patient called said she received in the mail some type of swab test and did not know what she should do with it. Patient called her insurance company medicare and they did not know what it was for. Patient ask should she do the swab. Call back # [phone]

## 2023-01-02 ENCOUNTER — Telehealth: Payer: Self-pay | Admitting: Gastroenterology

## 2023-01-02 NOTE — Telephone Encounter (Signed)
Good afternoon Dr. Lavon Paganini  The following patient is being referred to [place] for Gastroesophageal reflux disease, Hepatic cirrhosis, History of colon cancer. She has previous GI history with [facility] and just does not wish to continue care with them for personal reasons. Records are available in Epic. Please review and advise of scheduling.

## 2023-01-02 NOTE — Telephone Encounter (Signed)
Request received to transfer GI care from outside practice to [facility].  We appreciate the interest in our practice, however at this time due to high demand from patients without established GI providers we cannot accommodate this transfer.      

## 2023-01-03 NOTE — Telephone Encounter (Signed)
Spoke with patient advised we have not ordered any swab testing she advised she would throw it away

## 2023-01-04 ENCOUNTER — Telehealth: Payer: Self-pay | Admitting: Family Medicine

## 2023-01-04 NOTE — Telephone Encounter (Signed)
Pt sees Dr Fransico Him for diabetes management. Pls send request for CGM  to his office , thank you, all that you have left in my office has been returned to your area

## 2023-01-05 ENCOUNTER — Telehealth: Payer: Self-pay

## 2023-01-05 MED ORDER — AMOXICILLIN 500 MG PO TABS: ORAL_TABLET | ORAL | 0 refills | Status: DC

## 2023-01-05 NOTE — Telephone Encounter (Signed)
Pt is calling requesting a Rx for amoxicillin, this medication is not on med list, for a dental appt that pt has coming up. Would Dr. Rennis Golden like to prescribe this medication? Please address

## 2023-01-05 NOTE — Telephone Encounter (Signed)
Returned call to pt. Pt would like to know if she need to take an ATB prior to her dental appt. Will forward this to our pre-op pool

## 2023-01-05 NOTE — Telephone Encounter (Signed)
Faxed to Dr Dennie Bible office

## 2023-01-05 NOTE — Telephone Encounter (Signed)
Returned call to pt to pick up her ATB from pharmacy prior to her dental procedure. Pt verbalized understanding.

## 2023-01-06 ENCOUNTER — Other Ambulatory Visit: Payer: Self-pay

## 2023-01-06 MED ORDER — ACCU-CHEK GUIDE W/DEVICE KIT: 1.0000 | PACK | Freq: Four times a day (QID) | 0 refills | Status: AC

## 2023-01-09 ENCOUNTER — Other Ambulatory Visit: Payer: Self-pay | Admitting: Family Medicine

## 2023-01-12 ENCOUNTER — Other Ambulatory Visit: Payer: Self-pay

## 2023-01-12 MED ORDER — FREESTYLE LIBRE 3 READER DEVI: 0 refills | Status: AC

## 2023-01-19 DIAGNOSIS — G894 Chronic pain syndrome: Secondary | ICD-10-CM | POA: Diagnosis not present

## 2023-01-19 DIAGNOSIS — M545 Low back pain, unspecified: Secondary | ICD-10-CM | POA: Diagnosis not present

## 2023-01-19 DIAGNOSIS — N390 Urinary tract infection, site not specified: Secondary | ICD-10-CM | POA: Diagnosis not present

## 2023-01-23 ENCOUNTER — Encounter: Payer: Self-pay | Admitting: Podiatry

## 2023-01-23 ENCOUNTER — Ambulatory Visit (INDEPENDENT_AMBULATORY_CARE_PROVIDER_SITE_OTHER): Payer: Medicare Other | Admitting: Podiatry

## 2023-01-23 DIAGNOSIS — Z794 Long term (current) use of insulin: Secondary | ICD-10-CM

## 2023-01-23 DIAGNOSIS — M79674 Pain in right toe(s): Secondary | ICD-10-CM | POA: Diagnosis not present

## 2023-01-23 DIAGNOSIS — M79675 Pain in left toe(s): Secondary | ICD-10-CM

## 2023-01-23 DIAGNOSIS — N181 Chronic kidney disease, stage 1: Secondary | ICD-10-CM | POA: Diagnosis not present

## 2023-01-23 DIAGNOSIS — E1122 Type 2 diabetes mellitus with diabetic chronic kidney disease: Secondary | ICD-10-CM | POA: Diagnosis not present

## 2023-01-23 DIAGNOSIS — B351 Tinea unguium: Secondary | ICD-10-CM | POA: Diagnosis not present

## 2023-01-23 NOTE — Progress Notes (Signed)
  Subjective:  Patient ID: Joanna Brown, female    DOB: 08-05-1940,  MRN: [id number]  Chief Complaint  Patient presents with   Nail Problem    fungal infection on both feet    82 y.o. female presents with the above complaint. History confirmed with patient.  She feels like the discoloration is worsening in the great toenails  Objective:  Physical Exam: warm, good capillary refill, no trophic changes or ulcerative lesions, normal DP and PT pulses, and normal sensory exam. Left Foot: dystrophic yellowed discolored nail plates with subungual debris Right Foot: dystrophic yellowed discolored nail plates with subungual debris   Assessment:   1. Pain due to onychomycosis of toenails of both feet   2. Type 2 diabetes mellitus with stage 1 chronic kidney disease, with long-term current use of insulin (HCC)      Plan:  Patient was evaluated and treated and all questions answered.  Discussed the etiology and treatment options for the condition in detail with the patient. Educated patient on the topical and oral treatment options for mycotic nails. Recommended debridement of the nails today. Sharp and mechanical debridement performed of all painful and mycotic nails today. Nails debrided in length and thickness using a nail nipper to level of comfort. Discussed treatment options including appropriate shoe gear. Follow up as needed for painful nails.  I am doubtful that topical therapy will be effective at this point, she is not a good candidate for oral therapy due to her CKD, we discussed use of other modalities such as laser treatment.  She is interested in proceeding with this.  She will be scheduled for this and will follow-up with me as needed I recommend at least 4-5 sessions to see if this can show improvement.    No follow-ups on file.

## 2023-01-24 NOTE — Telephone Encounter (Signed)
Patient advised.

## 2023-01-25 DIAGNOSIS — E782 Mixed hyperlipidemia: Secondary | ICD-10-CM | POA: Diagnosis not present

## 2023-01-25 DIAGNOSIS — I10 Essential (primary) hypertension: Secondary | ICD-10-CM | POA: Diagnosis not present

## 2023-01-26 ENCOUNTER — Other Ambulatory Visit (HOSPITAL_COMMUNITY): Payer: Self-pay | Admitting: Family Medicine

## 2023-01-26 DIAGNOSIS — R809 Proteinuria, unspecified: Secondary | ICD-10-CM | POA: Diagnosis not present

## 2023-01-26 DIAGNOSIS — Z1231 Encounter for screening mammogram for malignant neoplasm of breast: Secondary | ICD-10-CM

## 2023-01-26 DIAGNOSIS — N189 Chronic kidney disease, unspecified: Secondary | ICD-10-CM | POA: Diagnosis not present

## 2023-01-26 DIAGNOSIS — E1122 Type 2 diabetes mellitus with diabetic chronic kidney disease: Secondary | ICD-10-CM | POA: Diagnosis not present

## 2023-01-26 DIAGNOSIS — E1129 Type 2 diabetes mellitus with other diabetic kidney complication: Secondary | ICD-10-CM | POA: Diagnosis not present

## 2023-01-26 DIAGNOSIS — I5032 Chronic diastolic (congestive) heart failure: Secondary | ICD-10-CM | POA: Diagnosis not present

## 2023-01-26 DIAGNOSIS — I129 Hypertensive chronic kidney disease with stage 1 through stage 4 chronic kidney disease, or unspecified chronic kidney disease: Secondary | ICD-10-CM | POA: Diagnosis not present

## 2023-02-01 ENCOUNTER — Encounter: Payer: Self-pay | Admitting: Pharmacist

## 2023-02-01 ENCOUNTER — Encounter (HOSPITAL_COMMUNITY): Payer: Self-pay

## 2023-02-01 ENCOUNTER — Ambulatory Visit (HOSPITAL_COMMUNITY)
Admission: RE | Admit: 2023-02-01 | Discharge: 2023-02-01 | Disposition: A | Discharge status: Home / Self Care | Payer: Medicare Other | Source: Ambulatory Visit | Attending: Family Medicine | Admitting: Family Medicine

## 2023-02-01 DIAGNOSIS — Z1231 Encounter for screening mammogram for malignant neoplasm of breast: Secondary | ICD-10-CM | POA: Diagnosis not present

## 2023-02-03 ENCOUNTER — Other Ambulatory Visit: Payer: Medicare Other

## 2023-02-03 DIAGNOSIS — E1129 Type 2 diabetes mellitus with other diabetic kidney complication: Secondary | ICD-10-CM | POA: Diagnosis not present

## 2023-02-03 DIAGNOSIS — R809 Proteinuria, unspecified: Secondary | ICD-10-CM | POA: Diagnosis not present

## 2023-02-03 DIAGNOSIS — E1122 Type 2 diabetes mellitus with diabetic chronic kidney disease: Secondary | ICD-10-CM | POA: Diagnosis not present

## 2023-02-03 DIAGNOSIS — I5032 Chronic diastolic (congestive) heart failure: Secondary | ICD-10-CM | POA: Diagnosis not present

## 2023-02-05 ENCOUNTER — Other Ambulatory Visit: Payer: Self-pay | Admitting: Family Medicine

## 2023-02-07 ENCOUNTER — Ambulatory Visit (INDEPENDENT_AMBULATORY_CARE_PROVIDER_SITE_OTHER): Payer: Medicare Other | Admitting: Family Medicine

## 2023-02-07 ENCOUNTER — Encounter: Payer: Self-pay | Admitting: Family Medicine

## 2023-02-07 VITALS — BP 153/72 | HR 65 | Ht 65.0 in | Wt 176.0 lb

## 2023-02-07 DIAGNOSIS — M4726 Other spondylosis with radiculopathy, lumbar region: Secondary | ICD-10-CM

## 2023-02-07 DIAGNOSIS — Z794 Long term (current) use of insulin: Secondary | ICD-10-CM | POA: Diagnosis not present

## 2023-02-07 DIAGNOSIS — I639 Cerebral infarction, unspecified: Secondary | ICD-10-CM | POA: Diagnosis not present

## 2023-02-07 DIAGNOSIS — E782 Mixed hyperlipidemia: Secondary | ICD-10-CM

## 2023-02-07 DIAGNOSIS — E1122 Type 2 diabetes mellitus with diabetic chronic kidney disease: Secondary | ICD-10-CM

## 2023-02-07 DIAGNOSIS — N181 Chronic kidney disease, stage 1: Secondary | ICD-10-CM | POA: Diagnosis not present

## 2023-02-07 DIAGNOSIS — E663 Overweight: Secondary | ICD-10-CM

## 2023-02-07 DIAGNOSIS — I10 Essential (primary) hypertension: Secondary | ICD-10-CM

## 2023-02-07 DIAGNOSIS — N1831 Chronic kidney disease, stage 3a: Secondary | ICD-10-CM

## 2023-02-07 MED ORDER — ROSUVASTATIN CALCIUM 20 MG PO TABS: 20.0000 mg | ORAL_TABLET | Freq: Every day | ORAL | 1 refills | Status: DC

## 2023-02-07 MED ORDER — CARVEDILOL 12.5 MG PO TABS: 12.5000 mg | ORAL_TABLET | Freq: Two times a day (BID) | ORAL | 3 refills | Status: DC

## 2023-02-07 NOTE — Patient Instructions (Addendum)
F/U in early January, call if you need me sooner  Need to reduce fried and fatty foods, cheese, nuts, red meat, cholesterol is extremely high  New additional  med for cholesterol rosuvastatin 20 mg daily , continue fenofibrate  145 mg as before  Fasting lipid, cmp and EGFR 3  to 5 days before next visit  Blood pressure remains high, dose increase in carvedilol to 12.5 mg one tablet twice daily, 12 hours apart  It is important that you exercise regularly at least 30 minutes 5 times a week. If you develop chest pain, have severe difficulty breathing, or feel very tired, stop exercising immediately and seek medical attention     Re consider the flu and shingles vaccines , both are recommended  Thanks for choosing [facility], we consider it a privelige to serve you.

## 2023-02-08 DIAGNOSIS — M47816 Spondylosis without myelopathy or radiculopathy, lumbar region: Secondary | ICD-10-CM | POA: Insufficient documentation

## 2023-02-08 NOTE — Assessment & Plan Note (Addendum)
Hyperlipidemia:Low fat diet discussed and encouraged. Not at goal and significantly elevated , change diet and add crestor 20 mg dose   Lipid Panel  Lab Results  Component Value Date   CHOL 266 (H) 01/25/2023   HDL 39 (L) 01/25/2023   LDLCALC 190 (H) 01/25/2023   TRIG 194 (H) 01/25/2023   CHOLHDL 6.8 (H) 01/25/2023

## 2023-02-08 NOTE — Assessment & Plan Note (Signed)
Uncontrolled , increase carvedilol dose DASH diet and commitment to daily physical activity for a minimum of 30 minutes discussed and encouraged, as a part of hypertension management. The importance of attaining a healthy weight is also discussed.     02/07/2023   10:35 AM 02/07/2023   10:32 AM 11/10/2022   10:35 AM 11/10/2022   10:22 AM 11/08/2022    9:09 AM 10/07/2022   10:26 AM 10/07/2022   10:21 AM  BP/Weight  Systolic BP 153 155 146 146 156 138 144  Diastolic BP 72 71 64 66 70 72 72  Wt. (Lbs)  176  178.8     BMI  29.29 kg/m2  29.75 kg/m2

## 2023-02-08 NOTE — Assessment & Plan Note (Signed)
Needs to be maintained on lifelong plavix

## 2023-02-08 NOTE — Assessment & Plan Note (Signed)
Stable and followed by Nephrology

## 2023-02-08 NOTE — Assessment & Plan Note (Signed)
Joanna Brown is reminded of the importance of commitment to daily physical activity for 30 minutes or more, as able and the need to limit carbohydrate intake to 30 to 60 grams per meal to help with blood sugar control.  Well controlled, managed by Endo  The need to take medication as prescribed, test blood sugar as directed, and to call between visits if there is a concern that blood sugar is uncontrolled is also discussed.   Joanna Brown is reminded of the importance of daily foot exam, annual eye examination, and good blood sugar, blood pressure and cholesterol control.     Latest Ref Rng & Units 01/25/2023    8:07 AM 11/10/2022   10:36 AM 08/03/2022    9:02 AM 07/21/2022    9:33 AM 06/20/2022    8:30 AM  Diabetic Labs  HbA1c 0.0 - 7.0 %  6.8  7.8     Chol 100 - 199 mg/dL 425     956   HDL >38 mg/dL 39     40   Calc LDL 0 - 99 mg/dL 756     433   Triglycerides 0 - 149 mg/dL 295     188   Creatinine 0.57 - 1.00 mg/dL 4.16    6.06  3.01       02/07/2023   10:35 AM 02/07/2023   10:32 AM 11/10/2022   10:35 AM 11/10/2022   10:22 AM 11/08/2022    9:09 AM 10/07/2022   10:26 AM 10/07/2022   10:21 AM  BP/Weight  Systolic BP 153 155 146 146 156 138 144  Diastolic BP 72 71 64 66 70 72 72  Wt. (Lbs)  176  178.8     BMI  29.29 kg/m2  29.75 kg/m2         Latest Ref Rng & Units 08/03/2021    9:20 AM 03/09/2021   12:00 AM  Foot/eye exam completion dates  Eye Exam No Retinopathy  No Retinopathy      Foot Form Completion  Done      This result is from an external source.

## 2023-02-08 NOTE — Assessment & Plan Note (Signed)
  Patient re-educated about  the importance of commitment to a  minimum of 150 minutes of exercise per week as able.  The importance of healthy food choices with portion control discussed, as well as eating regularly and within a 12 hour window most days. The need to choose "clean , green" food 50 to 75% of the time is discussed, as well as to make water the primary drink and set a goal of 64 ounces water daily.       02/07/2023   10:32 AM 11/10/2022   10:22 AM 10/07/2022    9:43 AM  Weight /BMI  Weight 176 lb 178 lb 12.8 oz 175 lb  Height 5\' 5"  (1.651 m) 5\' 5"  (1.651 m) 5\' 5"  (1.651 m)  BMI 29.29 kg/m2 29.75 kg/m2 29.12 kg/m2

## 2023-02-08 NOTE — Progress Notes (Signed)
Joanna Brown     MRN: [id number]      DOB: 10-14-1940  Chief Complaint  Patient presents with   Follow-up    Follow up pain in R side buttocks, heartbeat in ear stopped up, discuss plavix    HPI Joanna Brown is here for follow up and re-evaluation of chronic medical conditions, medication management and review of any available recent lab and radiology data.  Preventive health is updated, specifically  Cancer screening and Immunization.   Questions or concerns regarding consultations or procedures which the PT has had in the interim are  addressed. The PT denies any adverse reactions to current medications since the last visit.  Concerns as above ROS Denies recent fever or chills. Denies sinus pressure, nasal congestion,  or sore throat. Denies chest congestion, productive cough or wheezing. Denies chest pains, palpitations and leg swelling Denies abdominal pain, nausea, vomiting,diarrhea or constipation.   Denies dysuria, frequency, hesitancy or incontinence.  Denies headaches, seizures, numbness, or tingling. Denies depression, anxiety or insomnia. Denies skin break down or rash.   PE  BP (!) 153/72 (BP Location: Left Arm, Patient Position: Sitting, Cuff Size: Large)   Pulse 65   Ht 5\' 5"  (1.651 m)   Wt 176 lb (79.8 kg)   SpO2 96%   BMI 29.29 kg/m   Patient alert and oriented and in no cardiopulmonary distress.  HEENT: No facial asymmetry, EOMI,     Neck supple .No bruit, TM clear bilaterally, good lighht reflex, no erythema of tM or outer ear canal  Chest: Clear to auscultation bilaterally.  CVS: S1, S2 systolic  murmur, no S3.Regular rate.  ABD: Soft non tender.   Ext: No edema  MS: Decreased  ROM lumbnar spine,adequate in  shoulders, hips and knees.  Skin: Intact, no ulcerations or rash noted.  Psych: Good eye contact, normal affect. Memory intact not anxious or depressed appearing.  CNS: CN 2-12 intact, power,  normal throughout.no focal deficits  noted.   Assessment & Plan  Essential hypertension Uncontrolled , increase carvedilol dose DASH diet and commitment to daily physical activity for a minimum of 30 minutes discussed and encouraged, as a part of hypertension management. The importance of attaining a healthy weight is also discussed.     02/07/2023   10:35 AM 02/07/2023   10:32 AM 11/10/2022   10:35 AM 11/10/2022   10:22 AM 11/08/2022    9:09 AM 10/07/2022   10:26 AM 10/07/2022   10:21 AM  BP/Weight  Systolic BP 153 155 146 146 156 138 144  Diastolic BP 72 71 64 66 70 72 72  Wt. (Lbs)  176  178.8     BMI  29.29 kg/m2  29.75 kg/m2          Stroke (cerebrum) (HCC) Needs to be maintained on lifelong plavix  Type 2 diabetes mellitus with stage 1 chronic kidney disease, with long-term current use of insulin (HCC) Joanna Brown is reminded of the importance of commitment to daily physical activity for 30 minutes or more, as able and the need to limit carbohydrate intake to 30 to 60 grams per meal to help with blood sugar control.  Well controlled, managed by Endo  The need to take medication as prescribed, test blood sugar as directed, and to call between visits if there is a concern that blood sugar is uncontrolled is also discussed.   Joanna Brown is reminded of the importance of daily foot exam, annual eye examination, and good blood sugar,  blood pressure and cholesterol control.     Latest Ref Rng & Units 01/25/2023    8:07 AM 11/10/2022   10:36 AM 08/03/2022    9:02 AM 07/21/2022    9:33 AM 06/20/2022    8:30 AM  Diabetic Labs  HbA1c 0.0 - 7.0 %  6.8  7.8     Chol 100 - 199 mg/dL 161     096   HDL >04 mg/dL 39     40   Calc LDL 0 - 99 mg/dL 540     981   Triglycerides 0 - 149 mg/dL 191     478   Creatinine 0.57 - 1.00 mg/dL 2.95    6.21  3.08       02/07/2023   10:35 AM 02/07/2023   10:32 AM 11/10/2022   10:35 AM 11/10/2022   10:22 AM 11/08/2022    9:09 AM 10/07/2022   10:26 AM 10/07/2022   10:21 AM  BP/Weight   Systolic BP 153 155 146 146 156 138 144  Diastolic BP 72 71 64 66 70 72 72  Wt. (Lbs)  176  178.8     BMI  29.29 kg/m2  29.75 kg/m2         Latest Ref Rng & Units 08/03/2021    9:20 AM 03/09/2021   12:00 AM  Foot/eye exam completion dates  Eye Exam No Retinopathy  No Retinopathy      Foot Form Completion  Done      This result is from an external source.        Mixed hyperlipidemia Hyperlipidemia:Low fat diet discussed and encouraged. Not at goal and significantly elevated , change diet and add crestor 20 mg dose   Lipid Panel  Lab Results  Component Value Date   CHOL 266 (H) 01/25/2023   HDL 39 (L) 01/25/2023   LDLCALC 190 (H) 01/25/2023   TRIG 194 (H) 01/25/2023   CHOLHDL 6.8 (H) 01/25/2023       Overweight (BMI 25.0-29.9)  Patient re-educated about  the importance of commitment to a  minimum of 150 minutes of exercise per week as able.  The importance of healthy food choices with portion control discussed, as well as eating regularly and within a 12 hour window most days. The need to choose "clean , green" food 50 to 75% of the time is discussed, as well as to make water the primary drink and set a goal of 64 ounces water daily.       02/07/2023   10:32 AM 11/10/2022   10:22 AM 10/07/2022    9:43 AM  Weight /BMI  Weight 176 lb 178 lb 12.8 oz 175 lb  Height 5\' 5"  (1.651 m) 5\' 5"  (1.651 m) 5\' 5"  (1.651 m)  BMI 29.29 kg/m2 29.75 kg/m2 29.12 kg/m2      Degenerative joint disease (DJD) of lumbar spine Current mild flare of pain which is improving , tylenol as needed for symptom control  CKD (chronic kidney disease) stage 3, GFR 30-59 ml/min (HCC) Stable and followed by Nephrology

## 2023-02-08 NOTE — Assessment & Plan Note (Signed)
Current mild flare of pain which is improving , tylenol as needed for symptom control

## 2023-02-13 ENCOUNTER — Other Ambulatory Visit: Payer: Self-pay | Admitting: Gastroenterology

## 2023-02-15 ENCOUNTER — Ambulatory Visit: Payer: Medicare Other | Attending: Internal Medicine | Admitting: Internal Medicine

## 2023-02-15 ENCOUNTER — Encounter: Payer: Self-pay | Admitting: Internal Medicine

## 2023-02-15 ENCOUNTER — Ambulatory Visit: Payer: Medicare Other | Admitting: Podiatry

## 2023-02-15 VITALS — BP 168/80 | HR 59 | Ht 65.0 in | Wt 171.0 lb

## 2023-02-15 DIAGNOSIS — Z01812 Encounter for preprocedural laboratory examination: Secondary | ICD-10-CM | POA: Insufficient documentation

## 2023-02-15 DIAGNOSIS — I10 Essential (primary) hypertension: Secondary | ICD-10-CM | POA: Diagnosis not present

## 2023-02-15 DIAGNOSIS — R0602 Shortness of breath: Secondary | ICD-10-CM | POA: Diagnosis not present

## 2023-02-15 DIAGNOSIS — Z952 Presence of prosthetic heart valve: Secondary | ICD-10-CM | POA: Insufficient documentation

## 2023-02-15 DIAGNOSIS — R011 Cardiac murmur, unspecified: Secondary | ICD-10-CM | POA: Insufficient documentation

## 2023-02-15 MED ORDER — CHLORTHALIDONE 25 MG PO TABS: 25.0000 mg | ORAL_TABLET | Freq: Every day | ORAL | 3 refills | Status: DC

## 2023-02-15 MED ORDER — EZETIMIBE 10 MG PO TABS: 10.0000 mg | ORAL_TABLET | Freq: Every day | ORAL | 3 refills | Status: DC

## 2023-02-15 MED ORDER — PRAVASTATIN SODIUM 80 MG PO TABS: 80.0000 mg | ORAL_TABLET | Freq: Every evening | ORAL | 3 refills | Status: DC

## 2023-02-15 NOTE — Patient Instructions (Signed)
Medication Instructions:  RESUME pravastatin 80mg  and zetia 10mg  daily for cholesterol   STOP rosuvastatin  STOP fenofibrate  START chlorthalidone 25mg  daily for BP  *If you need a refill on your cardiac medications before your next appointment, please call your pharmacy*   Lab Work: Non-Fasting BNP and BMET in 2 weeks  If you have labs (blood work) drawn today and your tests are completely normal, you will receive your results only by: MyChart Message (if you have MyChart) OR A paper copy in the mail If you have any lab test that is abnormal or we need to change your treatment, we will call you to review the results.   Testing/Procedures: Your physician has requested that you have an echocardiogram. Echocardiography is a painless test that uses sound waves to create images of your heart. It provides your doctor with information about the size and shape of your heart and how well your heart's chambers and valves are working. This procedure takes approximately one hour. There are no restrictions for this procedure. Please do NOT wear cologne, perfume, aftershave, or lotions (deodorant is allowed). Please arrive 15 minutes prior to your appointment time.    Follow-Up: At [facility], you and your health needs are our priority.  As part of our continuing mission to provide you with exceptional heart care, we have created designated Provider Care Teams.  These Care Teams include your primary Cardiologist (physician) and Advanced Practice Providers (APPs -  Physician Assistants and Nurse Practitioners) who all work together to provide you with the care you need, when you need it.  We recommend signing up for the patient portal called "MyChart".  Sign up information is provided on this After Visit Summary.  MyChart is used to connect with patients for Virtual Visits (Telemedicine).  Patients are able to view lab/test results, encounter notes, upcoming appointments, etc.  Non-urgent  messages can be sent to your provider as well.   To learn more about what you can do with MyChart, go to [email or web address].    Your next appointment:    1-2 months with Dr. Rennis Golden or NP/PA

## 2023-02-15 NOTE — Progress Notes (Signed)
OFFICE NOTE  Chief Complaint:  Follow-up aortic stenosis  Primary Care Physician: Kerri Perches, MD  HPI:  Joanna Brown is an 82 year old female who has a history of mild aortic stenosis with a valve area of approximately 1.7 cm, also a history of dyslipidemia and hypertension, both of which have been well controlled. We are seeing her back for an annual visit today. She reports actually feeling very well, started walking every day, has made major changes to her diet as reflected by a marked decrease in triglycerides. Unfortunately recently she had a mild elevation in liver enzymes slightly above normal on lovastatin. Typically we could tolerate liver enzymes up to 3 times normal on statin medications, however, she was taken off the lovastatin. Either way it is questionable whether she really needs the additional statin at this time and this certainly argues that she should have further workup as to why she had elevated liver enzymes, whether this is due to a new non-alcoholic steatohepatitis or perhaps concomitant medications causing her elevated liver enzymes.  In fact, I reviewed her CT scan today he years ago and she does have steatohepatitis.  Therefore she is not a good candidate for a statin medication. Her blood pressure seems to be well-controlled on her current regimen.  I saw Joanna Brown back in the office today. She is without any new complaints. She occasionally gets some heartburn and is not currently taking medication for that. She denies any chest pain or worsening shortness of breath.  Joanna Brown returns today for follow-up. Again she is without complaints. We discussed her aortic stenosis however she seemed to have little recollection that she has this disorder. I again went over aortic stenosis and the fact that she has mild to moderate narrowing of the aortic valve. This time we are monitoring it clinically. Her last echo was in April of last year. She is asymptomatic.  Her blood pressure is well controlled. She had recent laboratory work which shows an LDL cholesterol of 70 in October which is good control. Her hemoglobin A1c is 7 which is down from 8.3 prior to that. I've encouraged her to continue with this trend is good cholesterol and blood sugar control her helpful in slowing the process of aortic stenosis.  09/12/2016  Joanna Brown was seen today in follow-up. Overall she seems to be doing well. She has no complaints such as shortness of breath or chest pain. EKG is stable showing normal sinus rhythm at 70 with LAFB. Blood pressure is at goal today. She reports her 11 A1c is in the low 7 range. Cholesterol is also been fairly well controlled. She does have a history of moderate aortic stenosis and is due for repeat echo.  09/12/2017  Joanna Brown was seen today in follow-up.  Over the past year she denies any chest pain or worsening shortness of breath.  Unfortunately her hemoglobin A1c is up from the low sevens to 8.2.  Cholesterol also is higher than goal.  Her total cholesterol is 171, HDL 40, LDL 97 and triglycerides 227.  She reports compliance with pravastatin.  She has moderate aortic stenosis with a mean gradient of 20 mmHg based on echo last year and normal LV function.  She did report one brief episode of chest discomfort with more marked exertion a few days ago.  This was right sided underneath the right breast and was a crampy sensation which improved after resting.  I advised that she monitor that further and if  she has more recurrence we may need to consider stress testing.  12/31/2018  Joanna Brown is seen today for routine follow-up.  She was seen in April 2019.  Since then she reports she was doing fairly well up to about 2 weeks ago.  She has had some worsening shortness of breath, particularly at night and while laying down.  She has a mild nonproductive cough.  She also reports some occasional lower extremity edema.  She gets some shortness of  breath with exertion but denies any chest pain, pressure, heaviness or other anginal symptoms.  EKG today shows incomplete right bundle branch pattern.  Her diabetes not well controlled with A1c recently of 8.8.  Her recent cholesterol profile in February showed total cholesterol 145, triglycerides 225, HDL 41 and LDL 73.  She has known aortic stenosis which is at least moderate however not assessed since 2018 by echo.  04/30/2019  Joanna Brown returns today for follow-up.  She had an echo in August 2020 which showed an EF 50 to 55%, moderate LVH, grade 1 diastolic dysfunction.  There was moderate to severe aortic stenosis with a mean gradient of 21 mmHg, AVA by VTI was 1 cm with a dimensionless index of 0.21.  Symptom wise she does report some shortness of breath with exertion but is not consistent.  She has required some pillows to elevate her head at night.  She denies any chest pain at rest.  She has had no presyncopal or syncopal episodes.  10/24/2019  Joanna Brown is seen today in follow-up.  She reports that she has had some chest pain on and off for the past 2 weeks.  She thought initially it might be something that she ate however she has had several episodes with doing house work and other activities that do improve with rest.  She was supposed to have a repeat echo in December and actually is scheduled for repeat echo next week.  On exam today her aortic valve sounds moderately severe.  These could be symptoms associated with the valve.  EKG was personally reviewed shows no new ischemic changes.  11/27/2019  Joanna Brown is seen today in follow-up.  She recently had an echocardiogram to evaluate her aortic stenosis.  As expected the valve is moderately severe.  LVEF is normal with mean gradient 29 mmHg and dimensionless index of 0.26.  Aortic valve area 0.91 cm.  This is more consistent with moderate to severe AS and it appeared visually severely stenotic suggesting possible low-flow low gradient severe  aortic stenosis.  After discussing with her today does not seem like she is having any exertional symptoms, chest pain, heart failure or presyncopal symptoms.  Unfortunately recently she underwent a colonoscopy was found to have a cancerous polyp.  That was removed however a surgical consultation was recommended.  The patient is hesitant with her surgical recommendation and wishes to see another specialist in [place] if possible.  04/16/2020  Joanna Brown returns today for follow-up.  She is due for repeat echo for aortic stenosis but not until January when it scheduled.  She denies any recurrent chest pain or worsening shortness of breath.  She says she is fairly active.  Was felt that she might have a more severe aortic stenosis with a low gradient based on her last echo in June.  Apparently no further work-up was performed regarding her colonoscopy which was abnormal.  She never had surgery because the colon was not appropriately marked.  She is supposed to have another  colonoscopy.  09/24/2020  Joanna Brown is seen today in follow-up as an urgent visit.  Over the past week or so she is noted worsening fatigue and shortness of breath.  She also had an episode that lasted about 30 minutes of left-sided chest discomfort which was felt like a pressure and associated with shortness of breath that eventually resolved.  This occurred at rest.  Over the past several months she has had some decline in her exercise ability.  She used to walk regularly but has backed off on that secondary to this.  Her last echo in January did show moderate to severe aortic stenosis, with a mean gradient of 5 mmHg up from 28 mmHg.  I had recommended a repeat echo in 6 months which would have been July 2022.  03/15/2021  Joanna Brown returns today for follow-up of her aortic stenosis.  She has been seen recently by Dr. Clifton James and was noted to have severe aortic stenosis, thought to be a poor surgical candidate but potentially  good candidate for TAVR.  There were concerns about access for her valve.  She has an appointment with Dr. Laneta Simmers with surgery on November 7 to discuss this further.  Hopefully she will be a TAVR candidate through a vascular approach.  She denies any worsening shortness of breath or chest pain.  She is had no syncopal episodes.  She has been having some headaches and has been worked up by her PCP.  Is unclear whether this may be related to her moderate to severe aortic stenosis.  10/07/2021  Joanna Brown returns today for follow-up.  She underwent TAVR for aortic stenosis back on March 23, 2021 with a 23 mm Edwards SAPIEN 3 valve.  Subsequent follow-up shows normal leaflet function with a 12 mmHg gradient and no perivalvular leak.  Overall she has done well without any worsening chest pain or shortness of breath.  Blood pressure appears well controlled.  Her A1c was 7.1%.  LDL most recently was 90.  02/15/2023  Joanna Brown is seen today in follow-up.  Recently she saw her PCP and had some lab work done.  This showed extremely high cholesterol with total 266, HDL 39, triglycerides 194 and LDL 190.  About a year ago her LDL was at 90.  She says she has been compliant with her meds although it does indicate in the chart that her Zetia and her pravastatin were stopped earlier this year.  Her PCP then added rosuvastatin 20 mg and fenofibrate.  Since starting these medicines she says she has been having stomach upset and has not felt well with them.  This was only a few weeks ago.  Her blood pressure is high today 168/80.  I rechecked it at 160/70.  She was noted on echo last year to have some aortic insufficiency which was new after her TAVR surgery.  She reports some intermittent shortness of breath with exertion but no swelling or other heart failure symptoms.  PMHx:  Past Medical History:  Diagnosis Date   Anginal pain (HCC)    Arthritis    OA   Diabetes mellitus    Dyspnea    Female bladder prolapse     GERD (gastroesophageal reflux disease)    Glaucoma    Hyperlipidemia    Hypertension    echo and stress 4/10 reports on chart, EKG ` LOV 9/12 on chart   S/P TAVR (transcatheter aortic valve replacement) 03/23/2021   Edwards 23mm S3U TF approach with Dr. Clifton James  and Dr. Laneta Simmers   Severe aortic stenosis    Thyroid disease     Past Surgical History:  Procedure Laterality Date   ABDOMINAL HYSTERECTOMY     ANTERIOR AND POSTERIOR REPAIR  04/26/2011   Procedure: ANTERIOR (CYSTOCELE) AND POSTERIOR REPAIR (RECTOCELE);  Surgeon: Martina Sinner, MD;  Location: [facility];  Service: Urology;  Laterality: N/A;   BIOPSY  05/25/2020   Procedure: BIOPSY;  Surgeon: Sherrilyn Rist, MD;  Location: [facility];  Service: Gastroenterology;;   CATARACT EXTRACTION Bilateral    with IOL   CHOLECYSTECTOMY  2009   COLONOSCOPY N/A 12/02/2013   three colon polyps removed, small internal hemorrhoids. Hyperplastic polyps   COLONOSCOPY N/A 11/20/2019   pancolonic diverticulosis, two 10-11 mm polyps in ascending colon, one 5 mm polyp in cecum, ascending colon with superficially invasive adenocarcinoma arising in background of sessile serrated polyps with low and high grade cytologic dysplasia.   COLONOSCOPY     COLONOSCOPY W/ POLYPECTOMY     COLONOSCOPY WITH PROPOFOL N/A 05/25/2020   diverticulosis in right colon, redundant colon. Caution warranted on future colonoscopy in light of age, cardiac condition, challenging anatomy.    DILATION AND CURETTAGE OF UTERUS     pt states this was in the 1970's   ESOPHAGOGASTRODUODENOSCOPY (EGD) WITH PROPOFOL N/A 05/25/2020   Grade 1 esophageal varices, single mucosal nodule in stomach s/p biopsy. (hyperplastic)>    LEFT HEART CATH  09/10/2008   normal coronary arteries, normal LV systolic function, EF 65% (Dr. Claudia Desanctis)   LYMPH NODE DISSECTION Right 1997   under arm   NM MYOCAR PERF WALL MOTION  2010   dipyridamole - mild-mod in intenstiy perfusion defect in mid  anterior, mid anteroseptal wall, EF 70%   OVARY SURGERY     bilateral tumors removed   POLYPECTOMY  11/20/2019   Procedure: POLYPECTOMY;  Surgeon: Corbin Ade, MD;  Location: [facility];  Service: Endoscopy;;  hot and cold snare cecal polyp, and asending polyps x 2   RIGHT/LEFT HEART CATH AND CORONARY ANGIOGRAPHY N/A 10/02/2020   Procedure: RIGHT/LEFT HEART CATH AND CORONARY ANGIOGRAPHY;  Surgeon: [place], Peter M, MD;  [facility]VAS[facility] CV LAB;  Service: Cardiovascular;  Laterality: N/A;   THYROIDECTOMY     THYROIDECTOMY  02/2008   TRANSCATHETER AORTIC VALVE REPLACEMENT, TRANSFEMORAL N/A 03/23/2021   Procedure: TRANSCATHETER AORTIC VALVE REPLACEMENT, TRANSFEMORAL;  Surgeon: Kathleene Hazel, MD;  Location: [facility] INVASIVE CV LAB;  Service: Open Heart Surgery;  Laterality: N/A;   TRANSTHORACIC ECHOCARDIOGRAM  08/2011   EF=>55%, mild conc LVH; trace MR; mild TR; mild-mod AV calcification with mild valvular AV stenosis   VAGINAL PROLAPSE REPAIR  04/26/2011   Procedure: VAGINAL VAULT SUSPENSION;  Surgeon: Martina Sinner, MD;  Location: [facility];  Service: Urology;  Laterality: N/A;  with Graft  10x6    FAMHx:  Family History  Problem Relation Age of Onset   Stomach cancer Mother 74   Heart disease Mother 24       heart disease   Heart disease Father 40       MI   Stroke Maternal Grandfather    Heart attack Paternal Grandfather    Hypertension Brother    Bone cancer Brother    Hypertension Sister    Liver cancer Sister    Hypertension Sister    Hypertension Child    Colon cancer Neg Hx    Colon polyps Neg Hx    Pancreatic cancer Neg Hx  Esophageal cancer Neg Hx    Rectal cancer Neg Hx     SOCHx:   reports that she has never smoked. She has never used smokeless tobacco. She reports that she does not currently use alcohol. She reports that she does not use drugs.  ALLERGIES:  Allergies  Allergen Reactions   Senokot Wheat Bran [Wheat]     ABDOMINAL CRAMPS    Spironolactone     Stomach problems, vision changes     ROS: Pertinent items noted in HPI and remainder of comprehensive ROS otherwise negative.  HOME MEDS: Current Outpatient Medications  Medication Sig Dispense Refill   amLODipine (NORVASC) 10 MG tablet TAKE 1 TABLET(10 MG) BY MOUTH EVERY MORNING 90 tablet 3   amoxicillin (AMOXIL) 500 MG tablet Take 2g (4 500mg  tablets) by mouth 2 hours prior to dental procedure.     Ascorbic Acid (VITAMIN C) 1000 MG tablet Take 1,000 mg by mouth 4 (four) times a week. AT NIGHT     benazepril (LOTENSIN) 40 MG tablet TAKE 1 TABLET(40 MG) BY MOUTH DAILY 90 tablet 3   Blood Glucose Monitoring Suppl (ACCU-CHEK GUIDE) w/Device KIT 1 each by Does not apply route 4 (four) times daily. 1 kit 0   carvedilol (COREG) 12.5 MG tablet Take 1 tablet (12.5 mg total) by mouth 2 (two) times daily with a meal. 60 tablet 3   cholecalciferol (VITAMIN D3) 25 MCG (1000 UT) tablet Take 1,000 Units by mouth in the morning.     clopidogrel (PLAVIX) 75 MG tablet Take 1 tablet (75 mg total) by mouth daily. 90 tablet 3   Continuous Glucose Receiver (FREESTYLE LIBRE 3 READER) DEVI Use to test BG 4 + times daily. E11.65 1 each 0   dapagliflozin propanediol (FARXIGA) 5 MG TABS tablet Take 5 mg by mouth daily.     eplerenone (INSPRA) 25 MG tablet Take 12.5 mg by mouth daily.     fenofibrate (TRICOR) 145 MG tablet TAKE 1 TABLET(145 MG) BY MOUTH DAILY 30 tablet 0   glucose blood (ACCU-CHEK GUIDE) test strip 1 each by Other route in the morning, at noon, in the evening, and at bedtime. Use as instructed 4 x daily. E11.65 150 each 3   insulin isophane & regular human KwikPen (HUMULIN 70/30 KWIKPEN) (70-30) 100 UNIT/ML KwikPen Inject 40 units with breakfast and 40 units with supper when glucose readings are above 90. 30 mL 0   Insulin Pen Needle (B-D ULTRAFINE III SHORT PEN) 31G X 8 MM MISC USE WITH INSULIN TWICE DAILY AS DIRECTED 200 each 2   latanoprost (XALATAN) 0.005 % ophthalmic solution  Place 1 drop into both eyes at bedtime.      LINZESS 290 MCG CAPS capsule TAKE 1 CAPSULE(290 MCG) BY MOUTH DAILY BEFORE BREAKFAST 90 capsule 3   metFORMIN (GLUCOPHAGE) 500 MG tablet TAKE 1 TABLET(500 MG) BY MOUTH DAILY WITH BREAKFAST 90 tablet 1   Multiple Vitamin (MULTIVITAMIN WITH MINERALS) TABS tablet Take 1 tablet by mouth daily.     Omega-3 Fatty Acids (FISH OIL) 1200 MG CAPS Take 1,200 mg by mouth every morning.     pantoprazole (PROTONIX) 40 MG tablet Take 1 tablet (40 mg total) by mouth daily. 30 minutes breakfast 90 tablet 3   rosuvastatin (CRESTOR) 20 MG tablet Take 1 tablet (20 mg total) by mouth daily. 90 tablet 1   UNABLE TO FIND Med Name: TDAP VACCINE 1 each 0   No current facility-administered medications for this visit.    LABS/IMAGING: No results  found for this or any previous visit (from the past 48 hour(s)). No results found.  VITALS: BP (!) 168/80 (BP Location: Left Arm, Patient Position: Sitting, Cuff Size: Normal)   Pulse (!) 59   Ht 5\' 5"  (1.651 m)   Wt 171 lb (77.6 kg)   SpO2 98%   BMI 28.46 kg/m   EXAM: General appearance: alert and no distress Neck: no carotid bruit and no JVD Lungs: clear to auscultation bilaterally Heart: regular rate and rhythm, s1/s2, 3/6 diastolic murmur at the LLSB Abdomen: soft, non-tender; bowel sounds normal; no masses,  no organomegaly Extremities: extremities normal, atraumatic, no cyanosis or edema Pulses: 2+ and symmetric Skin: Skin color, texture, turgor normal. No rashes or lesions Neurologic: Grossly normal Psych: Mood, affect normal  EKG: EKG Interpretation Date/Time:  Wednesday February 15 2023 10:34:44 EDT Ventricular Rate:  59 PR Interval:  186 QRS Duration:  100 QT Interval:  432 QTC Calculation: 427 R Axis:   -33  Text Interpretation: Sinus bradycardia Left axis deviation Moderate voltage criteria for LVH, may be normal variant ( R in aVL , Cornell product ) When compared with ECG of 21-Aug-2021 21:44, No  significant change was found Confirmed by Zoila Shutter [id number]) on 02/15/2023 10:46:10 AM    ASSESSMENT: Severe aortic stenosis status post 23 mm Edwards SAPIEN 3 transcatheter heart valve replacement (03/23/2021)-noted to have mild AI by echo in 2023 Hypertension-poorly controlled Dyslipidemia - non-compliant with medication NAFLD GERD DM2 on insulin ?medication non-compliance  PLAN: 1.   Joanna Brown is noted to have poorly controlled hypertension and I think there is a concern for possible medication noncompliance.  She has been on numerous different types of medications for both blood pressure and cholesterol.  Over this past year her pravastatin and ezetimibe were stopped or taken off her list, however recently her LDL was up to 190.  She was started on Crestor and fenofibrate for which she says she is having intolerances.  Will plan to go back to the pravastatin and ezetimibe and stop the Crestor and fenofibrate.  Her blood pressure again is also an issue.  She reports previous intolerance to spironolactone.  She is supposedly on amlodipine, carvedilol and benazepril.  Will add chlorthalidone 25 mg daily.  Check a metabolic profile and BNP in about 2 weeks.  I will need to see her back in follow-up soon.  I would advise an echocardiogram to reassess her murmur since her AI appears to be louder.  She will also need repeat lipids in a few months.  Plan follow-up with me after her echo.  Chrystie Nose, MD, [facility], FACP  [place]  [facility]   Medical Director of the [facility] Diplomate of the American Board of Clinical Lipidology Attending Cardiologist  Direct Dial: [phone]  Fax: [phone]  Website:  [email or web address] Nicely Reynaldo Rossman 02/15/2023, 10:46 AM

## 2023-03-02 ENCOUNTER — Encounter: Payer: Self-pay | Admitting: Gastroenterology

## 2023-03-07 ENCOUNTER — Other Ambulatory Visit: Payer: Self-pay | Admitting: Family Medicine

## 2023-03-09 DIAGNOSIS — R0602 Shortness of breath: Secondary | ICD-10-CM | POA: Diagnosis not present

## 2023-03-09 DIAGNOSIS — Z01812 Encounter for preprocedural laboratory examination: Secondary | ICD-10-CM | POA: Diagnosis not present

## 2023-03-10 ENCOUNTER — Ambulatory Visit (HOSPITAL_COMMUNITY): Payer: Medicare Other | Attending: Internal Medicine

## 2023-03-10 DIAGNOSIS — R011 Cardiac murmur, unspecified: Secondary | ICD-10-CM | POA: Insufficient documentation

## 2023-03-10 DIAGNOSIS — I351 Nonrheumatic aortic (valve) insufficiency: Secondary | ICD-10-CM | POA: Diagnosis not present

## 2023-03-10 DIAGNOSIS — Z952 Presence of prosthetic heart valve: Secondary | ICD-10-CM | POA: Insufficient documentation

## 2023-03-10 LAB — ECHOCARDIOGRAM COMPLETE
AR max vel: 1.49 cm2
AV Area VTI: 1.34 cm2
AV Area mean vel: 1.39 cm2
AV Mean grad: 14 mm[Hg]
AV Peak grad: 25.6 mm[Hg]
Ao pk vel: 2.53 m/s
Area-P 1/2: 2.03 cm2
Est EF: 55
S' Lateral: 3.7 cm

## 2023-03-10 LAB — BASIC METABOLIC PANEL
BUN/Creatinine Ratio: 21 (ref 12–28)
BUN: 25 mg/dL (ref 8–27)
CO2: 28 mmol/L (ref 20–29)
Calcium: 9.6 mg/dL (ref 8.7–10.3)
Chloride: 94 mmol/L — ABNORMAL LOW (ref 96–106)
Creatinine, Ser: 1.19 mg/dL — ABNORMAL HIGH (ref 0.57–1.00)
Glucose: 338 mg/dL — ABNORMAL HIGH (ref 70–99)
Potassium: 3.8 mmol/L (ref 3.5–5.2)
Sodium: 136 mmol/L (ref 134–144)
eGFR: 46 mL/min/{1.73_m2} — ABNORMAL LOW (ref >59)

## 2023-03-10 LAB — BRAIN NATRIURETIC PEPTIDE: BNP: 23.9 pg/mL (ref 0.0–100.0)

## 2023-03-16 DIAGNOSIS — N181 Chronic kidney disease, stage 1: Secondary | ICD-10-CM | POA: Diagnosis not present

## 2023-03-16 DIAGNOSIS — E1122 Type 2 diabetes mellitus with diabetic chronic kidney disease: Secondary | ICD-10-CM | POA: Diagnosis not present

## 2023-03-16 DIAGNOSIS — Z794 Long term (current) use of insulin: Secondary | ICD-10-CM | POA: Diagnosis not present

## 2023-03-17 ENCOUNTER — Ambulatory Visit (INDEPENDENT_AMBULATORY_CARE_PROVIDER_SITE_OTHER): Payer: Medicare Other

## 2023-03-17 DIAGNOSIS — B351 Tinea unguium: Secondary | ICD-10-CM

## 2023-03-17 DIAGNOSIS — M79674 Pain in right toe(s): Secondary | ICD-10-CM

## 2023-03-17 DIAGNOSIS — M79675 Pain in left toe(s): Secondary | ICD-10-CM

## 2023-03-17 LAB — COMPREHENSIVE METABOLIC PANEL
ALT: 19 [IU]/L (ref 0–32)
AST: 22 [IU]/L (ref 0–40)
Albumin: 4.3 g/dL (ref 3.7–4.7)
Alkaline Phosphatase: 54 [IU]/L (ref 44–121)
BUN/Creatinine Ratio: 20 (ref 12–28)
BUN: 23 mg/dL (ref 8–27)
Bilirubin Total: 0.3 mg/dL (ref 0.0–1.2)
CO2: 28 mmol/L (ref 20–29)
Calcium: 9.8 mg/dL (ref 8.7–10.3)
Chloride: 97 mmol/L (ref 96–106)
Creatinine, Ser: 1.13 mg/dL — ABNORMAL HIGH (ref 0.57–1.00)
Globulin, Total: 2.7 g/dL (ref 1.5–4.5)
Glucose: 151 mg/dL — ABNORMAL HIGH (ref 70–99)
Potassium: 3.7 mmol/L (ref 3.5–5.2)
Sodium: 141 mmol/L (ref 134–144)
Total Protein: 7 g/dL (ref 6.0–8.5)
eGFR: 49 mL/min/{1.73_m2} — ABNORMAL LOW (ref >59)

## 2023-03-17 LAB — LIPID PANEL
Chol/HDL Ratio: 3.7 {ratio} (ref 0.0–4.4)
Cholesterol, Total: 157 mg/dL (ref 100–199)
HDL: 43 mg/dL (ref >39)
LDL Chol Calc (NIH): 82 mg/dL (ref 0–99)
Triglycerides: 187 mg/dL — ABNORMAL HIGH (ref 0–149)
VLDL Cholesterol Cal: 32 mg/dL (ref 5–40)

## 2023-03-17 LAB — T4, FREE: Free T4: 1.31 ng/dL (ref 0.82–1.77)

## 2023-03-17 LAB — TSH: TSH: 3.53 u[IU]/mL (ref 0.450–4.500)

## 2023-03-17 NOTE — Patient Instructions (Signed)
Follow-up care is important to the success of your foot laser treatment. Please keep these instructions for future reference.  ° ° °Normal activity can resume immediately.  ° °2. IF you doctor prescribed an antifungal cream apply medication to the skin on the bottom of your feet, sides of your feet, between your toes, and around your nails. (This cream is not intended for use on your nails) Apply cream as directed ° °3. IF a topical for your nails was prescribed by your doctor, apply to nail/nails once daily until nail is free from infection unless otherwise directed by your doctor. Maximum use for nail topical is 12 months.  ° °4. Spray the insides of your shoes with an over the counter antifungal spray or Lysol disinfectant (aerosol can) at the end of each day or use an ultra violet shoe sterilizer as directed. Try not to wear the same shoes everyday.  ° °5. Buy new nail clippers and files since your current pair may be infected. Metal nail care instruments may also be cleaned with diluted bleach or boiling water. DO NOT share nail clippers or nail files. ° °6. Keep your toenails trimmed and clean.  ° °7. Wear flip-flops in public places especially hotel rooms and showers, athletic club locker rooms and showers and indoor swimming pools.  ° °8. Avoid nail salons that do not clean their instruments properly or use a whirlpool system.   °

## 2023-03-17 NOTE — Progress Notes (Signed)
Patient presents today for the 1st laser treatment. Diagnosed with mycotic nail infection by Dr. Lilian Kapur.   Toenail most affected 1st toe bilaterally.  All other systems are negative.  Nails were filed thin. Laser therapy was administered to 1st toenails bilaterally and patient tolerated the treatment well. All safety precautions were in place.    Follow up in 6 weeks for laser # 2.    MD wants patient to have 4x treatments. This was 1 of 4.

## 2023-03-18 ENCOUNTER — Other Ambulatory Visit: Payer: Self-pay | Admitting: "Endocrinology

## 2023-03-18 DIAGNOSIS — E1122 Type 2 diabetes mellitus with diabetic chronic kidney disease: Secondary | ICD-10-CM

## 2023-03-23 ENCOUNTER — Encounter: Payer: Self-pay | Admitting: "Endocrinology

## 2023-03-23 ENCOUNTER — Ambulatory Visit: Payer: Medicare Other | Admitting: "Endocrinology

## 2023-03-23 ENCOUNTER — Telehealth: Payer: Self-pay

## 2023-03-23 VITALS — BP 128/70 | HR 56 | Ht 65.0 in | Wt 177.2 lb

## 2023-03-23 DIAGNOSIS — E049 Nontoxic goiter, unspecified: Secondary | ICD-10-CM | POA: Diagnosis not present

## 2023-03-23 DIAGNOSIS — N181 Chronic kidney disease, stage 1: Secondary | ICD-10-CM

## 2023-03-23 DIAGNOSIS — E1122 Type 2 diabetes mellitus with diabetic chronic kidney disease: Secondary | ICD-10-CM

## 2023-03-23 DIAGNOSIS — E782 Mixed hyperlipidemia: Secondary | ICD-10-CM

## 2023-03-23 DIAGNOSIS — Z794 Long term (current) use of insulin: Secondary | ICD-10-CM

## 2023-03-23 DIAGNOSIS — I10 Essential (primary) hypertension: Secondary | ICD-10-CM

## 2023-03-23 LAB — POCT GLYCOSYLATED HEMOGLOBIN (HGB A1C): HbA1c, POC (controlled diabetic range): 7.7 % — AB (ref 0.0–7.0)

## 2023-03-23 MED ORDER — HUMULIN 70/30 KWIKPEN (70-30) 100 UNIT/ML ~~LOC~~ SUPN: PEN_INJECTOR | SUBCUTANEOUS | 0 refills | Status: DC

## 2023-03-23 NOTE — Progress Notes (Signed)
Needed 03/23/2023                     Endocrinology follow-up note   Subjective:    Patient ID: Joanna Brown, female    DOB: 05/05/1941. She is being seen in follow-up for uncontrolled type 2 diabetes, complicated by CKD, hyperlipidemia, hypertension.     PMD:   Kerri Perches, MD  Past Medical History:  Diagnosis Date   Anginal pain (HCC)    Arthritis    OA   Diabetes mellitus    Dyspnea    Female bladder prolapse    GERD (gastroesophageal reflux disease)    Glaucoma    Hyperlipidemia    Hypertension    echo and stress 4/10 reports on chart, EKG ` LOV 9/12 on chart   S/P TAVR (transcatheter aortic valve replacement) 03/23/2021   Edwards 23mm S3U TF approach with Dr. Clifton James and Dr. Laneta Simmers   Severe aortic stenosis    Thyroid disease    Past Surgical History:  Procedure Laterality Date   ABDOMINAL HYSTERECTOMY     ANTERIOR AND POSTERIOR REPAIR  04/26/2011   Procedure: ANTERIOR (CYSTOCELE) AND POSTERIOR REPAIR (RECTOCELE);  Surgeon: Martina Sinner, MD;  Location: [facility];  Service: Urology;  Laterality: N/A;   BIOPSY  05/25/2020   Procedure: BIOPSY;  Surgeon: Sherrilyn Rist, MD;  Location: [facility];  Service: Gastroenterology;;   CATARACT EXTRACTION Bilateral    with IOL   CHOLECYSTECTOMY  2009   COLONOSCOPY N/A 12/02/2013   three colon polyps removed, small internal hemorrhoids. Hyperplastic polyps   COLONOSCOPY N/A 11/20/2019   pancolonic diverticulosis, two 10-11 mm polyps in ascending colon, one 5 mm polyp in cecum, ascending colon with superficially invasive adenocarcinoma arising in background of sessile serrated polyps with low and high grade cytologic dysplasia.   COLONOSCOPY     COLONOSCOPY W/ POLYPECTOMY     COLONOSCOPY WITH PROPOFOL N/A 05/25/2020   diverticulosis in right colon, redundant colon. Caution warranted on future colonoscopy in light of age, cardiac condition, challenging anatomy.    DILATION AND CURETTAGE OF UTERUS     pt  states this was in the 1970's   ESOPHAGOGASTRODUODENOSCOPY (EGD) WITH PROPOFOL N/A 05/25/2020   Grade 1 esophageal varices, single mucosal nodule in stomach s/p biopsy. (hyperplastic)>    LEFT HEART CATH  09/10/2008   normal coronary arteries, normal LV systolic function, EF 65% (Dr. Claudia Desanctis)   LYMPH NODE DISSECTION Right 1997   under arm   NM MYOCAR PERF WALL MOTION  2010   dipyridamole - mild-mod in intenstiy perfusion defect in mid anterior, mid anteroseptal wall, EF 70%   OVARY SURGERY     bilateral tumors removed   POLYPECTOMY  11/20/2019   Procedure: POLYPECTOMY;  Surgeon: Corbin Ade, MD;  Location: [facility];  Service: Endoscopy;;  hot and cold snare cecal polyp, and asending polyps x 2   RIGHT/LEFT HEART CATH AND CORONARY ANGIOGRAPHY N/A 10/02/2020   Procedure: RIGHT/LEFT HEART CATH AND CORONARY ANGIOGRAPHY;  Surgeon: [place], Peter M, MD;  Location: [facility]VASIVE CV LAB;  Service: Cardiovascular;  Laterality: N/A;   THYROIDECTOMY     THYROIDECTOMY  02/2008   TRANSCATHETER AORTIC VALVE REPLACEMENT, TRANSFEMORAL N/A 03/23/2021   Procedure: TRANSCATHETER AORTIC VALVE REPLACEMENT, TRANSFEMORAL;  Surgeon: Kathleene Hazel, MD;  Location: [facility] INV[facility] CV LAB;  Service: Open Heart Surgery;  Laterality: N/A;   TRANSTHORACIC ECHOCARDIOGRAM  08/2011   EF=>55%, mild conc LVH; trace MR;  mild TR; mild-mod AV calcification with mild valvular AV stenosis   VAGINAL PROLAPSE REPAIR  04/26/2011   Procedure: VAGINAL VAULT SUSPENSION;  Surgeon: Martina Sinner, MD;  Location: [facility];  Service: Urology;  Laterality: N/A;  with Graft  10x6   Social History   Socioeconomic History   Marital status: Married    Spouse name: [name]   Number of children: 1   Years of education: Trade   Highest education level: 12th grade  Occupational History   Occupation: Retired   Occupation: Insurance account manager  Tobacco Use   Smoking status: Never   Smokeless tobacco: Never  Vaping Use    Vaping status: Never Used  Substance and Sexual Activity   Alcohol use: Not Currently   Drug use: No   Sexual activity: Yes  Other Topics Concern   Not on file  Social History Narrative   Patient lives at home with spouse. 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 LIVED IN [place] FOR 50 YRS AND CAME BACK TO [place] ~2009. Caffeine Use: 1 cup of coffee daily. HAD ONE CHILD: PASSED 5 YRS. HAVE THREE GRAND-KIDS AND TWO GREAT GRANDS.    Social Determinants of Health   Financial Resource Strain: Medium Risk (02/05/2023)   Overall Financial Resource Strain (CARDIA)    Difficulty of Paying Living Expenses: Somewhat hard  Food Insecurity: Food Insecurity Present (02/05/2023)   Hunger Vital Sign    Worried About Running Out of Food in the Last Year: Sometimes true    Ran Out of Food in the Last Year: Never true  Transportation Needs: Unmet Transportation Needs (02/05/2023)   PRAPARE - Transportation    Lack of Transportation (Medical): Yes    Lack of Transportation (Non-Medical): Yes  Physical Activity: Insufficiently Active (02/05/2023)   Exercise Vital Sign    Days of Exercise per Week: 4 days    Minutes of Exercise per Session: 30 min  Stress: Stress Concern Present (02/05/2023)   [employer] of Occupational Health - Occupational Stress Questionnaire    Feeling of Stress : To some extent  Social Connections: Socially Integrated (02/05/2023)   Social Connection and Isolation Panel [NHANES]    Frequency of Communication with Friends and Family: More than three times a week    Frequency of Social Gatherings with Friends and Family: Once a week    Attends Religious Services: More than 4 times per year    Active Member of [employer] or Organizations: Yes    Attends Banker Meetings: More than 4 times per year    Marital Status: Married   Outpatient Encounter Medications as of 03/23/2023  Medication Sig   amLODipine (NORVASC) 10 MG tablet TAKE 1 TABLET(10 MG) BY  MOUTH EVERY MORNING   amoxicillin (AMOXIL) 500 MG tablet Take 2g (4 500mg  tablets) by mouth 2 hours prior to dental procedure.   Ascorbic Acid (VITAMIN C) 1000 MG tablet Take 1,000 mg by mouth 4 (four) times a week. AT NIGHT   benazepril (LOTENSIN) 40 MG tablet TAKE 1 TABLET(40 MG) BY MOUTH DAILY   Blood Glucose Monitoring Suppl (ACCU-CHEK GUIDE) w/Device KIT 1 each by Does not apply route 4 (four) times daily.   carvedilol (COREG) 12.5 MG tablet Take 1 tablet (12.5 mg total) by mouth 2 (two) times daily with a meal.   chlorthalidone (HYGROTON) 25 MG tablet Take 1 tablet (25 mg total) by mouth daily.   cholecalciferol (VITAMIN D3) 25 MCG (1000 UT) tablet Take 1,000 Units by  mouth in the morning.   clopidogrel (PLAVIX) 75 MG tablet Take 1 tablet (75 mg total) by mouth daily.   Continuous Glucose Receiver (FREESTYLE LIBRE 3 READER) DEVI Use to test BG 4 + times daily. E11.65   dapagliflozin propanediol (FARXIGA) 5 MG TABS tablet Take 5 mg by mouth daily.   eplerenone (INSPRA) 25 MG tablet Take 12.5 mg by mouth daily.   ezetimibe (ZETIA) 10 MG tablet Take 1 tablet (10 mg total) by mouth daily.   glucose blood (ACCU-CHEK GUIDE) test strip 1 each by Other route in the morning, at noon, in the evening, and at bedtime. Use as instructed 4 x daily. E11.65   insulin isophane & regular human KwikPen (HUMULIN 70/30 KWIKPEN) (70-30) 100 UNIT/ML KwikPen INJECT 50 UNITS WITH BREAKFAST AND 40 UNITS WITH SUPPER WHEN GLUCOSE READING ABOVE 90   Insulin Pen Needle (B-D ULTRAFINE III SHORT PEN) 31G X 8 MM MISC USE WITH INSULIN TWICE DAILY AS DIRECTED   latanoprost (XALATAN) 0.005 % ophthalmic solution Place 1 drop into both eyes at bedtime.    LINZESS 290 MCG CAPS capsule TAKE 1 CAPSULE(290 MCG) BY MOUTH DAILY BEFORE BREAKFAST   metFORMIN (GLUCOPHAGE) 500 MG tablet TAKE 1 TABLET(500 MG) BY MOUTH DAILY WITH BREAKFAST   Multiple Vitamin (MULTIVITAMIN WITH MINERALS) TABS tablet Take 1 tablet by mouth daily.   Omega-3  Fatty Acids (FISH OIL) 1200 MG CAPS Take 1,200 mg by mouth every morning.   pantoprazole (PROTONIX) 40 MG tablet Take 1 tablet (40 mg total) by mouth daily. 30 minutes breakfast   pravastatin (PRAVACHOL) 80 MG tablet Take 1 tablet (80 mg total) by mouth every evening.   UNABLE TO FIND Med Name: TDAP VACCINE   [DISCONTINUED] fenofibrate (TRICOR) 145 MG tablet TAKE 1 TABLET(145 MG) BY MOUTH DAILY   [DISCONTINUED] insulin isophane & regular human KwikPen (HUMULIN 70/30 KWIKPEN) (70-30) 100 UNIT/ML KwikPen INJECT 40 UNITS WITH BREAKFAST AND 40 UNITS WITH SUPPER WHEN GLUCOSE READING ABOVE 90   No facility-administered encounter medications on file as of 03/23/2023.   ALLERGIES: Allergies  Allergen Reactions   Senokot Wheat Bran [Wheat]     ABDOMINAL CRAMPS   Spironolactone     Stomach problems, vision changes    VACCINATION STATUS: Immunization History  Administered Date(s) Administered   Hepatitis B, ADULT 09/27/2021   MODERNA COVID-19 SARS-COV-2 PEDS BIVALENT BOOSTER 76yr-72yr 01/09/2023   Moderna SARS-COV2 Booster Vaccination 11/19/2020   Moderna Sars-Covid-2 Vaccination 09/13/2019, 10/12/2019, 04/29/2020   Pneumococcal Conjugate-13 12/11/2013   Pneumococcal Polysaccharide-23 01/13/2010   Tdap 10/05/2010, 08/23/2022    Diabetes She presents for her follow-up diabetic visit. She has type 2 diabetes mellitus. Onset time: She was diagnosed at approximate age of 41 years. Her disease course has been worsening. There are no hypoglycemic associated symptoms. Pertinent negatives for hypoglycemia include no confusion, headaches, pallor or seizures. Pertinent negatives for diabetes include no blurred vision, no chest pain, no fatigue, no polydipsia, no polyphagia and no polyuria. There are no hypoglycemic complications. Symptoms are worsening. Diabetic complications include nephropathy and retinopathy. Risk factors for coronary artery disease include dyslipidemia, diabetes mellitus, obesity and  sedentary lifestyle. Her weight is increasing steadily. She is following a generally unhealthy diet. When asked about meal planning, she reported none. She has had a previous visit with a dietitian. She rarely participates in exercise. Her home blood glucose trend is increasing steadily. Her breakfast blood glucose range is generally 140-180 mg/dl. Her dinner blood glucose range is generally 180-200 mg/dl. Her bedtime blood  glucose range is generally 180-200 mg/dl. Her overall blood glucose range is 180-200 mg/dl. (Ms. Smarr presents with slightly worsening glycemic profile.  Her AGP report shows 52% time in range, 27% level 1 hyperglycemia, 20% level 2 hyperglycemia.  Her average blood glucose is 187 mg per DL.  Her point-of-care A1c is 7.7%.  ) Eye exam is current.  Hyperlipidemia This is a chronic problem. The current episode started more than 1 year ago. The problem is uncontrolled. Exacerbating diseases include diabetes and obesity. Pertinent negatives include no chest pain, myalgias or shortness of breath. Current antihyperlipidemic treatment includes statins. Risk factors for coronary artery disease include dyslipidemia, diabetes mellitus, hypertension, obesity, a sedentary lifestyle and post-menopausal.  Hypertension This is a chronic problem. The current episode started more than 1 year ago. Pertinent negatives include no blurred vision, chest pain, headaches, palpitations or shortness of breath. Risk factors for coronary artery disease include dyslipidemia, diabetes mellitus, obesity and sedentary lifestyle. Hypertensive end-organ damage includes retinopathy.   Nodular goiter She Patient is being seen today for a new issue with her thyroid.  She was found to have 2.6 cm nodule on right lobe of her thyroid while undergoing CT scan of the chest during cancer surveillance.  She has previous history of left hemithyroidectomy approximately 10 years ago for multinodular goiter.  She is not on thyroid  hormone supplement or replacement.  She denies dysphagia, shortness of breath, nor voice change. -Her subsequent thyroid ultrasound confirms multinodular right lobe of the thyroid with surgically absent left lobe.  Her right thyroid lobe nodule did not meet criteria for biopsy.  Review of systems  Constitutional: + Minimally fluctuating body weight, current  Body mass index is 29.49 kg/m. , no fatigue, no subjective hyperthermia, no subjective hypothermia    Objective:    BP 128/70   Pulse (!) 56   Ht 5\' 5"  (1.651 m)   Wt 177 lb 3.2 oz (80.4 kg)   BMI 29.49 kg/m   Wt Readings from Last 3 Encounters:  03/23/23 177 lb 3.2 oz (80.4 kg)  02/15/23 171 lb (77.6 kg)  02/07/23 176 lb (79.8 kg)    Physical Exam- Limited  Constitutional:  Body mass index is 29.49 kg/m. , not in acute distress, normal state of mind    CMP ( most recent) CMP     Component Value Date/Time   NA 141 03/16/2023 0807   K 3.7 03/16/2023 0807   CL 97 03/16/2023 0807   CO2 28 03/16/2023 0807   GLUCOSE 151 (H) 03/16/2023 0807   GLUCOSE 223 (H) 12/28/2021 1624   BUN 23 03/16/2023 0807   CREATININE 1.13 (H) 03/16/2023 0807   CREATININE 1.11 (H) 03/02/2020 0848   CALCIUM 9.8 03/16/2023 0807   PROT 7.0 03/16/2023 0807   ALBUMIN 4.3 03/16/2023 0807   AST 22 03/16/2023 0807   ALT 19 03/16/2023 0807   ALKPHOS 54 03/16/2023 0807   BILITOT 0.3 03/16/2023 0807   GFRNONAA 43 (L) 12/28/2021 1624   GFRNONAA 57 (L) 07/30/2019 0718   GFRAA 66 07/30/2019 0718    Diabetic Labs (most recent): Lab Results  Component Value Date   HGBA1C 7.7 (A) 03/23/2023   HGBA1C 6.8 11/10/2022   HGBA1C 7.8 (A) 08/03/2022   MICROALBUR 6.4 07/30/2019   MICROALBUR 11.5 06/28/2018   MICROALBUR 10.3 (H) 07/18/2017    Lipid Panel     Component Value Date/Time   CHOL 157 03/16/2023 0807   TRIG 187 (H) 03/16/2023 0807   HDL 43  03/16/2023 0807   CHOLHDL 3.7 03/16/2023 0807   CHOLHDL 4.2 12/29/2021 0443   VLDL 49 (H)  12/29/2021 0443   LDLCALC 82 03/16/2023 0807   LDLCALC 99 07/30/2019 0718   Incidental finding on chest CT on December 05, 2019 Enlarged  multinodular remnant right thyroid with dominant 2.6 cm hypodense Nodule.   Thyroid ultrasound on December 19, 2019: Right lobe 4.4 cm, left lobe absent surgically. No adenopathy   IMPRESSION: Surgical changes of left hemithyroidectomy.  Multinodular thyroid.  3.9 cm and 1.6 cm cystic/almost completely cystic nodules with smooth margins, No thyroid nodule meets criteria for biopsy or surveillance, as designated by the newly established ACR TI-RADS criteria.   Assessment & Plan:    1. Type 2 diabetes mellitus with stage 1 chronic kidney disease, with long-term current use of insulin (HCC).  Ms. Takach presents with slightly worsening glycemic profile.  Her AGP report shows 52% time in range, 27% level 1 hyperglycemia, 20% level 2 hyperglycemia.  Her average blood glucose is 187 mg per DL.  Her point-of-care A1c is 7.7%.     Recent labs reviewed, showing improving renal function.     Her diabetes is complicated by CKD obesity/sedentary life and patient remains at a high risk for more acute and chronic complications of diabetes which include CAD, CVA, CKD, retinopathy, and neuropathy. These are all discussed in detail with the patient.  - I have counseled the patient on diet management and weight loss, by adopting a carbohydrate restricted/protein rich diet.  -She still admits to dietary indiscretions including consumption of sweets and sweetened beverages. - she acknowledges that there is a room for improvement in her food and drink choices. - Suggestion is made for her to avoid simple carbohydrates  from her diet including Cakes, Sweet Desserts, Ice Cream, Soda (diet and regular), Sweet Tea, Candies, Chips, Cookies, Store Bought Juices, Alcohol , Artificial Sweeteners,  Coffee Creamer, and "Sugar-free" Products, Lemonade. This will help patient to have  more stable blood glucose profile and potentially avoid unintended weight gain.  The following Lifestyle Medicine recommendations according to American College of Lifestyle Medicine  [facility]) were discussed and and offered to patient and she  agrees to start the journey:  A. Whole Foods, Plant-Based Nutrition comprising of fruits and vegetables, plant-based proteins, whole-grain carbohydrates was discussed in detail with the patient.   A list for source of those nutrients were also provided to the patient.  Patient will use only water or unsweetened tea for hydration. B.  The need to stay away from risky substances including alcohol, smoking; obtaining 7 to 9 hours of restorative sleep, at least 150 minutes of moderate intensity exercise weekly, the importance of healthy social connections,  and stress management techniques were discussed. C.  A full color page of  Calorie density of various food groups per pound showing examples of each food groups was provided to the patient.    - I encouraged the patient to switch to  unprocessed or minimally processed complex starch and increased protein intake (animal or plant source), fruits, and vegetables.  - Patient is advised to stick to a routine mealtimes to eat 3 meals  a day and avoid unnecessary snacks ( to snack only to correct hypoglycemia).   - I have approached patient with the following individualized plan to manage diabetes and patient agrees:     -Since she presents with above target glycemic profile, she is approached for slightly higher dose of insulin.  I advised  her to increase her Humulin 70/30 to 50 units with breakfast and 40 units with supper  when Premeal blood glucose readings are above 90 mg per DL.   -She is encouraged to start monitoring blood glucose 4 times a day-before meals and at bedtime . -She is benefiting from her CGM, advised to utilize continuously.  -Patient is encouraged to call clinic for blood glucose levels less  than 70 or above 200 mg /dl. -She is benefiting from low-dose [place].  She is advised to continue Farxiga 5 mg daily at breakfast.  She is also on metformin 500 g p.o. twice a day at breakfast.  - Patient specific target  A1c;  LDL, HDL, Triglycerides, were discussed in detail.  2.  Nodular goiter: Patient with remote past history of left hemithyroidectomy for multinodular goiter.  She did not require thyroid hormone supplement.  She was incidentally found to have 2.6 cm nodule in the right lobe. -Her subsequent dedicated thyroid ultrasound confirmed 2 nodules on the right lobe of her thyroid which did not meet any criteria for biopsy. -She will not need any antithyroid intervention at this time.  Her recent thyroid function test were consistent with euthyroid presentation.  She will be considered for thyroid function test along with her subsequent labs.   3) BP/HTN:  -Her blood pressure is controlled to target.  She   She is advised to continue her current blood pressure medications including benazepril 40 mg p.o. daily at breakfast.    4) Lipids/HPL: Her recent lipid panel showed improvement in her LDL to 82 from 118.  She is advised to continue pravastatin 80 mg p.o. nightly.  Side effects and precautions discussed with her.    Whole food plant-based diet was discussed with her.       5)  Weight/Diet: Her BMI is 29.49--she is a candidate for moderate weight loss.  CDE Consult has been initiated , exercise, and detailed carbohydrates information provided.  6) Chronic Care/Health Maintenance:  -Patient is on ACEI/ARB and Statin medications and encouraged to continue to follow up with Ophthalmology, Podiatrist at least yearly or according to recommendations, and advised to  stay away from smoking. I have recommended yearly flu vaccine and pneumonia vaccination at least every 5 years; moderate intensity exercise for up to 150 minutes weekly; and  sleep for at least 7 hours a day.  Diabetes  foot exam is normal today.  POC ABI for PAD screen was normal on June 10, 2020.  This test will be repeated in January 2027, or sooner if needed.     - I advised patient to maintain close follow up with Kerri Perches, MD for primary care needs.   I spent  26  minutes in the care of the patient today including review of labs from CMP, Lipids, Thyroid Function, Hematology (current and previous including abstractions from other facilities); face-to-face time discussing  her blood glucose readings/logs, discussing hypoglycemia and hyperglycemia episodes and symptoms, medications doses, her options of short and long term treatment based on the latest standards of care / guidelines;  discussion about incorporating lifestyle medicine;  and documenting the encounter. Risk reduction counseling performed per USPSTF guidelines to reduce  obesity and cardiovascular risk factors.     Please refer to Patient Instructions for Blood Glucose Monitoring and Insulin/Medications Dosing Guide"  in media tab for additional information. Please  also refer to " Patient Self Inventory" in the Media  tab for reviewed elements of pertinent patient history.  Gwyneth Revels Beason participated in the discussions, expressed understanding, and voiced agreement with the above plans.  All questions were answered to her satisfaction. she is encouraged to contact clinic should she have any questions or concerns prior to her return visit.   Follow up plan: - Return in about 4 months (around 07/21/2023) for Bring Meter/CGM Device/Logs- A1c in Office, Urine MA - [place].  Marquis Lunch, MD Phone: [phone]  Fax: [phone]   This note was partially dictated with voice recognition software. Similar sounding words can be transcribed inadequately or may not  be corrected upon review.  03/23/2023, 1:48 PM

## 2023-03-23 NOTE — Telephone Encounter (Signed)
[facility] called requesting clarification of Novolin 70/30 dose. Pt's office visit note states 40 units with breakfast and supper. Rx sent in states 50 units at breakfast and 40 units at supper.

## 2023-03-23 NOTE — Patient Instructions (Signed)

## 2023-03-24 NOTE — Telephone Encounter (Signed)
Spoke with pharmacist at [facility] advising pt is to inject 50 units at breakfast and 40 units at supper per Dr.Nida. Understanding voiced.

## 2023-03-30 ENCOUNTER — Encounter: Payer: Self-pay | Admitting: Gastroenterology

## 2023-03-30 ENCOUNTER — Ambulatory Visit: Payer: Medicare Other | Admitting: Gastroenterology

## 2023-03-30 VITALS — BP 129/68 | HR 58 | Temp 98.8°F | Ht 65.0 in | Wt 177.6 lb

## 2023-03-30 DIAGNOSIS — K7460 Unspecified cirrhosis of liver: Secondary | ICD-10-CM

## 2023-03-30 DIAGNOSIS — K5900 Constipation, unspecified: Secondary | ICD-10-CM | POA: Diagnosis not present

## 2023-03-30 DIAGNOSIS — K7469 Other cirrhosis of liver: Secondary | ICD-10-CM

## 2023-03-30 MED ORDER — LUBIPROSTONE 24 MCG PO CAPS: 24.0000 ug | ORAL_CAPSULE | Freq: Two times a day (BID) | ORAL | 3 refills | Status: AC

## 2023-03-30 NOTE — Progress Notes (Signed)
Gastroenterology Office Note     Primary Care Physician:  Kerri Perches, MD  Primary Gastroenterologist: Dr. Marletta Lor    Chief Complaint   Chief Complaint  Patient presents with   Follow-up    Still has issues with constipation     History of Present Illness   Joanna Brown is an 82 y.o. female presenting today with a history of superficial invasive adenocarcinoma arising in background of a sessile serrated polyp in ascending colon in July 2021, surveillance colonoscopy in Jan 2022 but caution  warranted for colonoscopy in future due to age and cardiac condition. Cirrhosis noted on imaging; Hep C screening 2014 negative. Negative viral serologies.  EGD 2022 also performed with Grade 1 esophageal varices. Other GI issues including chronic constipation and GERD.    Cirrhosis care: US abdomen April 2024 no hepatoma. Due now but patient wants to do yearly.  EGD 2022 with grade 1 varices Completed Hep and A and B vaccinations AFP tumor marker would like to do yearly.    Constipation: no Linzess in about a month due to cost. Linzess not ideal but could tell a difference. Having lower abdominal discomfort that is intermittent. Not always associated with BM. Present for the past 4-5 months. Feels like constipation is worse. No rectal bleeding. Appetite is too good.    Past Medical History:  Diagnosis Date   Anginal pain (HCC)    Arthritis    OA   Diabetes mellitus    Dyspnea    Female bladder prolapse    GERD (gastroesophageal reflux disease)    Glaucoma    Hyperlipidemia    Hypertension    echo and stress 4/10 reports on chart, EKG ` LOV 9/12 on chart   S/P TAVR (transcatheter aortic valve replacement) 03/23/2021   Edwards 23mm S3U TF approach with Dr. Clifton James and Dr. Laneta Simmers   Severe aortic stenosis    Thyroid disease     Past Surgical History:  Procedure Laterality Date   ABDOMINAL HYSTERECTOMY     ANTERIOR AND POSTERIOR REPAIR  04/26/2011   Procedure:  ANTERIOR (CYSTOCELE) AND POSTERIOR REPAIR (RECTOCELE);  Surgeon: Martina Sinner, MD;  Location: [facility];  Service: Urology;  Laterality: N/A;   BIOPSY  05/25/2020   Procedure: BIOPSY;  Surgeon: Sherrilyn Rist, MD;  Location: [facility];  Service: Gastroenterology;;   CATARACT EXTRACTION Bilateral    with IOL   CHOLECYSTECTOMY  2009   COLONOSCOPY N/A 12/02/2013   three colon polyps removed, small internal hemorrhoids. Hyperplastic polyps   COLONOSCOPY N/A 11/20/2019   pancolonic diverticulosis, two 10-11 mm polyps in ascending colon, one 5 mm polyp in cecum, ascending colon with superficially invasive adenocarcinoma arising in background of sessile serrated polyps with low and high grade cytologic dysplasia.   COLONOSCOPY     COLONOSCOPY W/ POLYPECTOMY     COLONOSCOPY WITH PROPOFOL N/A 05/25/2020   diverticulosis in right colon, redundant colon. Caution warranted on future colonoscopy in light of age, cardiac condition, challenging anatomy.    DILATION AND CURETTAGE OF UTERUS     pt states this was in the 1970's   ESOPHAGOGASTRODUODENOSCOPY (EGD) WITH PROPOFOL N/A 05/25/2020   Grade 1 esophageal varices, single mucosal nodule in stomach s/p biopsy. (hyperplastic)>    LEFT HEART CATH  09/10/2008   normal coronary arteries, normal LV systolic function, EF 65% (Dr. Claudia Desanctis)   LYMPH NODE DISSECTION Right 1997   under arm   NM MYOCAR PERF WALL MOTION  2010   dipyridamole - mild-mod in intenstiy perfusion defect in mid anterior, mid anteroseptal wall, EF 70%   OVARY SURGERY     bilateral tumors removed   POLYPECTOMY  11/20/2019   Procedure: POLYPECTOMY;  Surgeon: Corbin Ade, MD;  Location: [facility];  Service: Endoscopy;;  hot and cold snare cecal polyp, and asending polyps x 2   RIGHT/LEFT HEART CATH AND CORONARY ANGIOGRAPHY N/A 10/02/2020   Procedure: RIGHT/LEFT HEART CATH AND CORONARY ANGIOGRAPHY;  Surgeon: [place], Peter M, MD;  Location: [facility] INVASIVE CV LAB;  Service:  Cardiovascular;  Laterality: N/A;   THYROIDECTOMY     THYROIDECTOMY  02/2008   TRANSCATHETER AORTIC VALVE REPLACEMENT, TRANSFEMORAL N/A 03/23/2021   Procedure: TRANSCATHETER AORTIC VALVE REPLACEMENT, TRANSFEMORAL;  Surgeon: Kathleene Hazel, MD;  Location: [facility] INVASIVE CV LAB;  Service: Open Heart Surgery;  Laterality: N/A;   TRANSTHORACIC ECHOCARDIOGRAM  08/2011   EF=>55%, mild conc LVH; trace MR; mild TR; mild-mod AV calcification with mild valvular AV stenosis   VAGINAL PROLAPSE REPAIR  04/26/2011   Procedure: VAGINAL VAULT SUSPENSION;  Surgeon: Martina Sinner, MD;  Location: [facility];  Service: Urology;  Laterality: N/A;  with Graft  10x6    Current Outpatient Medications  Medication Sig Dispense Refill   ALPHAGAN P 0.1 % SOLN PLACE 1 DROP INTO BOTH EYES EVERY MORNING     amLODipine (NORVASC) 10 MG tablet TAKE 1 TABLET(10 MG) BY MOUTH EVERY MORNING 90 tablet 3   benazepril (LOTENSIN) 40 MG tablet TAKE 1 TABLET(40 MG) BY MOUTH DAILY 90 tablet 3   Blood Glucose Monitoring Suppl (ACCU-CHEK GUIDE) w/Device KIT 1 each by Does not apply route 4 (four) times daily. 1 kit 0   carvedilol (COREG) 12.5 MG tablet Take 1 tablet (12.5 mg total) by mouth 2 (two) times daily with a meal. 60 tablet 3   cholecalciferol (VITAMIN D3) 25 MCG (1000 UT) tablet Take 1,000 Units by mouth in the morning.     clopidogrel (PLAVIX) 75 MG tablet Take 1 tablet (75 mg total) by mouth daily. 90 tablet 3   Continuous Glucose Receiver (FREESTYLE LIBRE 3 READER) DEVI Use to test BG 4 + times daily. E11.65 1 each 0   dapagliflozin propanediol (FARXIGA) 5 MG TABS tablet Take 5 mg by mouth daily.     eplerenone (INSPRA) 25 MG tablet Take 12.5 mg by mouth daily.     ezetimibe (ZETIA) 10 MG tablet Take 1 tablet (10 mg total) by mouth daily. 90 tablet 3   glucose blood (ACCU-CHEK GUIDE) test strip 1 each by Other route in the morning, at noon, in the evening, and at bedtime. Use as instructed 4 x daily. E11.65 150 each 3    insulin isophane & regular human KwikPen (HUMULIN 70/30 KWIKPEN) (70-30) 100 UNIT/ML KwikPen INJECT 50 UNITS WITH BREAKFAST AND 40 UNITS WITH SUPPER WHEN GLUCOSE READING ABOVE 90 30 mL 0   Insulin Pen Needle (B-D ULTRAFINE III SHORT PEN) 31G X 8 MM MISC USE WITH INSULIN TWICE DAILY AS DIRECTED 200 each 0   latanoprost (XALATAN) 0.005 % ophthalmic solution Place 1 drop into both eyes at bedtime.      metFORMIN (GLUCOPHAGE) 500 MG tablet TAKE 1 TABLET(500 MG) BY MOUTH DAILY WITH BREAKFAST 90 tablet 1   Multiple Vitamin (MULTIVITAMIN WITH MINERALS) TABS tablet Take 1 tablet by mouth daily.     Omega-3 Fatty Acids (FISH OIL) 1200 MG CAPS Take 1,200 mg by mouth every morning.  pantoprazole (PROTONIX) 40 MG tablet Take 1 tablet (40 mg total) by mouth daily. 30 minutes breakfast 90 tablet 3   pravastatin (PRAVACHOL) 80 MG tablet Take 1 tablet (80 mg total) by mouth every evening. 90 tablet 3   chlorthalidone (HYGROTON) 25 MG tablet Take 1 tablet (25 mg total) by mouth daily. (Patient not taking: Reported on 03/30/2023) 90 tablet 3   No current facility-administered medications for this visit.    Allergies as of 03/30/2023 - Review Complete 03/30/2023  Allergen Reaction Noted   Senokot wheat bran [wheat]  01/18/2018   Spironolactone  03/24/2014    Family History  Problem Relation Age of Onset   Stomach cancer Mother 41   Heart disease Mother 60       heart disease   Heart disease Father 37       MI   Stroke Maternal Grandfather    Heart attack Paternal Grandfather    Hypertension Brother    Bone cancer Brother    Hypertension Sister    Liver cancer Sister    Hypertension Sister    Hypertension Child    Colon cancer Neg Hx    Colon polyps Neg Hx    Pancreatic cancer Neg Hx    Esophageal cancer Neg Hx    Rectal cancer Neg Hx     Social History   Socioeconomic History   Marital status: Married    Spouse name: [name]   Number of children: 1   Years of education: Trade    Highest education level: 12th grade  Occupational History   Occupation: Retired   Occupation: Insurance account manager  Tobacco Use   Smoking status: Never   Smokeless tobacco: Never  Vaping Use   Vaping status: Never Used  Substance and Sexual Activity   Alcohol use: Not Currently   Drug use: No   Sexual activity: Yes  Other Topics Concern   Not on file  Social History Narrative   Patient lives at home with spouse. 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 LIVED IN [place] FOR 50 YRS AND CAME BACK TO [place] ~2009. Caffeine Use: 1 cup of coffee daily. HAD ONE CHILD: PASSED 5 YRS. HAVE THREE GRAND-KIDS AND TWO GREAT GRANDS.    Social Determinants of Health   Financial Resource Strain: Medium Risk (02/05/2023)   Overall Financial Resource Strain (CARDIA)    Difficulty of Paying Living Expenses: Somewhat hard  Food Insecurity: Food Insecurity Present (02/05/2023)   Hunger Vital Sign    Worried About Running Out of Food in the Last Year: Sometimes true    Ran Out of Food in the Last Year: Never true  Transportation Needs: Unmet Transportation Needs (02/05/2023)   PRAPARE - Transportation    Lack of Transportation (Medical): Yes    Lack of Transportation (Non-Medical): Yes  Physical Activity: Insufficiently Active (02/05/2023)   Exercise Vital Sign    Days of Exercise per Week: 4 days    Minutes of Exercise per Session: 30 min  Stress: Stress Concern Present (02/05/2023)   [employer] of Occupational Health - Occupational Stress Questionnaire    Feeling of Stress : To some extent  Social Connections: Socially Integrated (02/05/2023)   Social Connection and Isolation Panel [NHANES]    Frequency of Communication with Friends and Family: More than three times a week    Frequency of Social Gatherings with Friends and Family: Once a week    Attends Religious Services: More than 4 times per year  Active Member of Clubs or Organizations: Yes    Attends Tax inspector Meetings: More than 4 times per year    Marital Status: Married  Catering manager Violence: Not At Risk (08/02/2021)   Humiliation, Afraid, Rape, and Kick questionnaire    Fear of Current or Ex-Partner: No    Emotionally Abused: No    Physically Abused: No    Sexually Abused: No     Review of Systems   Gen: Denies any fever, chills, fatigue, weight loss, lack of appetite.  CV: Denies chest pain, heart palpitations, peripheral edema, syncope.  Resp: Denies shortness of breath at rest or with exertion. Denies wheezing or cough.  GI: Denies dysphagia or odynophagia. Denies jaundice, hematemesis, fecal incontinence. GU : Denies urinary burning, urinary frequency, urinary hesitancy MS: Denies joint pain, muscle weakness, cramps, or limitation of movement.  Derm: Denies rash, itching, dry skin Psych: Denies depression, anxiety, memory loss, and confusion Heme: Denies bruising, bleeding, and enlarged lymph nodes.   Physical Exam   BP 129/68 (BP Location: Right Arm, Patient Position: Sitting, Cuff Size: Normal)   Pulse (!) 58   Temp 98.8 F (37.1 C) (Oral)   Ht 5\' 5"  (1.651 m)   Wt 177 lb 9.6 oz (80.6 kg)   SpO2 95%   BMI 29.55 kg/m  General:   Alert and oriented. Pleasant and cooperative. Well-nourished and well-developed.  Head:  Normocephalic and atraumatic. Eyes:  Without icterus Abdomen:  +BS, soft, non-tender and non-distended. No HSM noted. No guarding or rebound. No masses appreciated.  Rectal:  Deferred  Msk:  Symmetrical without gross deformities. Normal posture. Extremities:  Without edema. Neurologic:  Alert and  oriented x4;  grossly normal neurologically. Skin:  Intact without significant lesions or rashes. Psych:  Alert and cooperative. Normal mood and affect.   Assessment   Joanna Brown is an 82 y.o. female presenting today with a history of superficial invasive adenocarcinoma arising in background of a sessile serrated polyp in ascending colon  in July 2021, surveillance colonoscopy in Jan 2022 but caution  warranted for colonoscopy in future due to age and cardiac condition, cirrhosis likely secondary to [facility], presenting with follow-up for constipation and GERD.  Constipation remains an issue. Linzess has been costly but still not ideal for symptom management. Recommend bowel purge today and different agent (Amitiza).   CIrrhosis: patient has wanted to hold off on extensive evaluation. Desires yearly HCC screenings and labs.     PLAN    Miralax purge today Stop LInzess. Trial of Amitiza 24 mcg po BID with food Patient desires to do yearly [place] and labs Pt will call if any persistent discomfort despite bowel regimen and will pursue CT High risk for anesthesia and recommend following clinically instead of surveillance colonoscopy 6 month return regardless    Gelene Mink, PhD, ANP-BC [facility] Gastroenterology

## 2023-03-30 NOTE — Patient Instructions (Signed)
For relief of constipation today, you can take Miralax 1 capful in 8 ounces of water every hour for 6 doses. Make sure to drink plenty of water!  I have sent in Amitiza for constipation. You will take this twice a day WITH FOOD (to avoid nausea). Please let us know how this works for you, so we can adjust as needed!  We will see you in 6 months!  I enjoyed seeing you again today! I value our relationship and want to provide genuine, compassionate, and quality care. You may receive a survey regarding your visit with me, and I welcome your feedback! Thanks so much for taking the time to complete this. I look forward to seeing you again.      Gelene Mink, PhD, ANP-BC [facility] Gastroenterology

## 2023-04-09 ENCOUNTER — Other Ambulatory Visit: Payer: Self-pay

## 2023-04-09 ENCOUNTER — Emergency Department (HOSPITAL_COMMUNITY)
Admission: EM | Admit: 2023-04-09 | Discharge: 2023-04-10 | Disposition: A | Discharge status: Home / Self Care | Payer: Medicare Other | Attending: Emergency Medicine | Admitting: Emergency Medicine

## 2023-04-09 ENCOUNTER — Emergency Department (HOSPITAL_COMMUNITY): Payer: Medicare Other

## 2023-04-09 ENCOUNTER — Encounter (HOSPITAL_COMMUNITY): Payer: Self-pay

## 2023-04-09 DIAGNOSIS — Z794 Long term (current) use of insulin: Secondary | ICD-10-CM | POA: Insufficient documentation

## 2023-04-09 DIAGNOSIS — M79672 Pain in left foot: Secondary | ICD-10-CM | POA: Insufficient documentation

## 2023-04-09 DIAGNOSIS — Z7902 Long term (current) use of antithrombotics/antiplatelets: Secondary | ICD-10-CM | POA: Diagnosis not present

## 2023-04-09 DIAGNOSIS — Z79899 Other long term (current) drug therapy: Secondary | ICD-10-CM | POA: Diagnosis not present

## 2023-04-09 DIAGNOSIS — I10 Essential (primary) hypertension: Secondary | ICD-10-CM | POA: Diagnosis not present

## 2023-04-09 DIAGNOSIS — Z7984 Long term (current) use of oral hypoglycemic drugs: Secondary | ICD-10-CM | POA: Diagnosis not present

## 2023-04-09 DIAGNOSIS — E119 Type 2 diabetes mellitus without complications: Secondary | ICD-10-CM | POA: Diagnosis not present

## 2023-04-09 NOTE — ED Triage Notes (Signed)
Pt arrived via POV from home c/o left foot pain that began yesterday afternoon. Pt ambulatory to Triage w/o assistance.

## 2023-04-10 NOTE — Discharge Instructions (Signed)
Your evaluation today did not show any sign of serious injury to your foot, and no sign of infection.  Please apply ice for 30 minutes at a time, 4 times a day.  You may take acetaminophen as needed for pain.  If pain persists or is getting worse, follow-up with the orthopedic physician.

## 2023-04-10 NOTE — ED Provider Notes (Signed)
[place] EMERGENCY DEPARTMENT AT [facility] Provider Note   CSN: [id number] Arrival date & time: 04/09/23  2129     History  Chief Complaint  Patient presents with   Foot Pain    Joanna Brown is a 82 y.o. female.  The history is provided by the patient.  Foot Pain  She has history of hypertension, diabetes, hyperlipidemia, TAVR and comes in complaining of pain in the lateral aspect of her left foot that started yesterday.  She denies injury or overuse.  She denies fever or chills.  She has not taken anything for pain.  She has been ambulatory.   Home Medications Prior to Admission medications   Medication Sig Start Date End Date Taking? Authorizing Provider  ALPHAGAN P 0.1 % SOLN PLACE 1 DROP INTO BOTH EYES EVERY MORNING 03/21/23   [provider]  amLODipine (NORVASC) 10 MG tablet TAKE 1 TABLET(10 MG) BY MOUTH EVERY MORNING 08/22/22   Kerri Perches, MD  benazepril (LOTENSIN) 40 MG tablet TAKE 1 TABLET(40 MG) BY MOUTH DAILY 08/23/22   Kerri Perches, MD  Blood Glucose Monitoring Suppl (ACCU-CHEK GUIDE) w/Device KIT 1 each by Does not apply route 4 (four) times daily. 01/06/23   Roma Kayser, MD  carvedilol (COREG) 12.5 MG tablet Take 1 tablet (12.5 mg total) by mouth 2 (two) times daily with a meal. 02/07/23   Kerri Perches, MD  cholecalciferol (VITAMIN D3) 25 MCG (1000 UT) tablet Take 1,000 Units by mouth in the morning.    [provider]  clopidogrel (PLAVIX) 75 MG tablet Take 1 tablet (75 mg total) by mouth daily. 06/28/22   Kerri Perches, MD  Continuous Glucose Receiver (FREESTYLE LIBRE 3 READER) DEVI Use to test BG 4 + times daily. E11.65 01/12/23   Roma Kayser, MD  dapagliflozin propanediol (FARXIGA) 5 MG TABS tablet Take 5 mg by mouth daily.    [provider]  eplerenone (INSPRA) 25 MG tablet Take 12.5 mg by mouth daily.    [provider]  ezetimibe (ZETIA) 10 MG tablet Take 1 tablet  (10 mg total) by mouth daily. 02/15/23 02/10/24  Hilty, Lisette Abu, MD  glucose blood (ACCU-CHEK GUIDE) test strip 1 each by Other route in the morning, at noon, in the evening, and at bedtime. Use as instructed 4 x daily. E11.65 04/05/22   Roma Kayser, MD  insulin isophane & regular human KwikPen (HUMULIN 70/30 KWIKPEN) (70-30) 100 UNIT/ML KwikPen INJECT 50 UNITS WITH BREAKFAST AND 40 UNITS WITH SUPPER WHEN GLUCOSE READING ABOVE 90 03/23/23   Nida, Denman George, MD  Insulin Pen Needle (B-D ULTRAFINE III SHORT PEN) 31G X 8 MM MISC USE WITH INSULIN TWICE DAILY AS DIRECTED 03/20/23   Nida, Denman George, MD  latanoprost (XALATAN) 0.005 % ophthalmic solution Place 1 drop into both eyes at bedtime.  02/18/19   [provider]  lubiprostone (AMITIZA) 24 MCG capsule Take 1 capsule (24 mcg total) by mouth 2 (two) times daily with a meal. 03/30/23   Gelene Mink, NP  metFORMIN (GLUCOPHAGE) 500 MG tablet TAKE 1 TABLET(500 MG) BY MOUTH DAILY WITH BREAKFAST 11/29/22   Roma Kayser, MD  Multiple Vitamin (MULTIVITAMIN WITH MINERALS) TABS tablet Take 1 tablet by mouth daily.    [provider]  Omega-3 Fatty Acids (FISH OIL) 1200 MG CAPS Take 1,200 mg by mouth every morning.    [provider]  pantoprazole (PROTONIX) 40 MG tablet Take 1 tablet (  40 mg total) by mouth daily. 30 minutes breakfast 07/21/22   Gelene Mink, NP  pravastatin (PRAVACHOL) 80 MG tablet Take 1 tablet (80 mg total) by mouth every evening. 02/15/23 05/16/23  Chrystie Nose, MD      Allergies    Senokot wheat bran [wheat] and Spironolactone    Review of Systems   Review of Systems  All other systems reviewed and are negative.   Physical Exam Updated Vital Signs BP (!) 149/75 (BP Location: Right Arm)   Pulse 76   Temp 98.8 F (37.1 C) (Oral)   Resp 18   Ht 5\' 5"  (1.651 m)   Wt 81 kg   SpO2 97%   BMI 29.72 kg/m  Physical Exam Vitals and nursing note reviewed.   82 year old female,  resting comfortably and in no acute distress. Vital signs are significant for mildly elevated blood pressure. Oxygen saturation is 97%, which is normal. Head is normocephalic and atraumatic. PERRLA, EOMI. O Lungs are clear without rales, wheezes, or rhonchi. Chest is nontender. Heart has regular rate and rhythm without murmur. Abdomen is soft, flat, nontender. Extremities: There is no erythema, warmth, swelling of the left foot.  There is mild tenderness to palpation of the soft tissues lateral to the fifth metatarsal but no bony tenderness.  There is normal passive range of motion without pain.  Capillary refill is prompt. Skin is warm and dry without rash. Neurologic: Mental status is normal, moves all extremities equally.  ED Results / Procedures / Treatments    Radiology DG Foot Complete Left  Result Date: 04/09/2023 CLINICAL DATA:  Left foot injury and pain EXAM: LEFT FOOT - COMPLETE 3+ VIEW COMPARISON:  None Available. FINDINGS: There is no evidence of fracture or dislocation. There is no evidence of arthropathy or other focal bone abnormality. Soft tissues are unremarkable. IMPRESSION: Negative. Electronically Signed   By: Helyn Numbers M.D.   On: 04/09/2023 22:42    Procedures Procedures    Medications Ordered in ED Medications - No data to display  ED Course/ Medical Decision Making/ A&P                                 Medical Decision Making Amount and/or Complexity of Data Reviewed Radiology: ordered.   Left foot pain of uncertain cause.  X-rays showed no evidence of fracture.  Have independently viewed the images, and agree with the radiologist's interpretation.  Exam is not consistent with tendinitis, gout.  Suspect mild contusion which she was not aware of the initial injury.  I have recommended that she apply ice and use over-the-counter acetaminophen as needed for pain.  She states that she cannot take NSAIDs.  I have recommended orthopedic follow-up if symptoms or  not improving.  Final Clinical Impression(s) / ED Diagnoses Final diagnoses:  Pain in left foot    Rx / DC Orders ED Discharge Orders     None         Dione Booze, MD 04/10/23 0040

## 2023-04-21 ENCOUNTER — Other Ambulatory Visit: Payer: Medicare Other

## 2023-04-23 ENCOUNTER — Other Ambulatory Visit: Payer: Self-pay | Admitting: "Endocrinology

## 2023-04-23 DIAGNOSIS — E1122 Type 2 diabetes mellitus with diabetic chronic kidney disease: Secondary | ICD-10-CM

## 2023-04-25 ENCOUNTER — Telehealth: Payer: Self-pay

## 2023-04-25 NOTE — Telephone Encounter (Signed)
Returned the pt's vm and when called no vm / answer. Looked in chart and her visit on 03/30/2023 she was given a trial of Amitiza. Pt stated that she has only taken 3 or 4 but they are not helping. Trying to make contact with the pt to see when she started this medication and how she is taking it. Will call back.

## 2023-04-28 DIAGNOSIS — I5032 Chronic diastolic (congestive) heart failure: Secondary | ICD-10-CM | POA: Diagnosis not present

## 2023-04-28 DIAGNOSIS — E1122 Type 2 diabetes mellitus with diabetic chronic kidney disease: Secondary | ICD-10-CM | POA: Diagnosis not present

## 2023-04-28 DIAGNOSIS — E1129 Type 2 diabetes mellitus with other diabetic kidney complication: Secondary | ICD-10-CM | POA: Diagnosis not present

## 2023-04-28 DIAGNOSIS — R809 Proteinuria, unspecified: Secondary | ICD-10-CM | POA: Diagnosis not present

## 2023-04-28 DIAGNOSIS — E876 Hypokalemia: Secondary | ICD-10-CM | POA: Diagnosis not present

## 2023-04-28 DIAGNOSIS — I129 Hypertensive chronic kidney disease with stage 1 through stage 4 chronic kidney disease, or unspecified chronic kidney disease: Secondary | ICD-10-CM | POA: Diagnosis not present

## 2023-05-01 DIAGNOSIS — H401131 Primary open-angle glaucoma, bilateral, mild stage: Secondary | ICD-10-CM | POA: Diagnosis not present

## 2023-05-04 ENCOUNTER — Ambulatory Visit: Payer: Medicare Other | Attending: Internal Medicine | Admitting: Internal Medicine

## 2023-05-04 VITALS — BP 144/72 | HR 73 | Ht 65.0 in | Wt 175.0 lb

## 2023-05-04 DIAGNOSIS — E785 Hyperlipidemia, unspecified: Secondary | ICD-10-CM | POA: Diagnosis not present

## 2023-05-04 DIAGNOSIS — I10 Essential (primary) hypertension: Secondary | ICD-10-CM | POA: Diagnosis not present

## 2023-05-04 DIAGNOSIS — T8203XD Leakage of heart valve prosthesis, subsequent encounter: Secondary | ICD-10-CM

## 2023-05-04 DIAGNOSIS — R0602 Shortness of breath: Secondary | ICD-10-CM

## 2023-05-04 DIAGNOSIS — Z952 Presence of prosthetic heart valve: Secondary | ICD-10-CM | POA: Diagnosis not present

## 2023-05-04 NOTE — Progress Notes (Signed)
OFFICE NOTE  Chief Complaint:  Follow-up aortic stenosis  Primary Care Physician: Kerri Perches, MD  HPI:  Joanna Brown is a 82 year old female who has a history of mild aortic stenosis with a valve area of approximately 1.7 cm, also a history of dyslipidemia and hypertension, both of which have been well controlled. We are seeing her back for an annual visit today. She reports actually feeling very well, started walking every day, has made major changes to her diet as reflected by a marked decrease in triglycerides. Unfortunately recently she had a mild elevation in liver enzymes slightly above normal on lovastatin. Typically we could tolerate liver enzymes up to 3 times normal on statin medications, however, she was taken off the lovastatin. Either way it is questionable whether she really needs the additional statin at this time and this certainly argues that she should have further workup as to why she had elevated liver enzymes, whether this is due to a new non-alcoholic steatohepatitis or perhaps concomitant medications causing her elevated liver enzymes.  In fact, I reviewed her CT scan today he years ago and she does have steatohepatitis.  Therefore she is not a good candidate for a statin medication. Her blood pressure seems to be well-controlled on her current regimen.  I saw Joanna Brown back in the office today. She is without any new complaints. She occasionally gets some heartburn and is not currently taking medication for that. She denies any chest pain or worsening shortness of breath.  Mrs Brown returns today for follow-up. Again she is without complaints. We discussed her aortic stenosis however she seemed to have little recollection that she has this disorder. I again went over aortic stenosis and the fact that she has mild to moderate narrowing of the aortic valve. This time we are monitoring it clinically. Her last echo was in April of last year. She is asymptomatic.  Her blood pressure is well controlled. She had recent laboratory work which shows an LDL cholesterol of 70 in October which is good control. Her hemoglobin A1c is 7 which is down from 8.3 prior to that. I've encouraged her to continue with this trend is good cholesterol and blood sugar control her helpful in slowing the process of aortic stenosis.  09/12/2016  Joanna Brown was seen today in follow-up. Overall she seems to be doing well. She has no complaints such as shortness of breath or chest pain. EKG is stable showing normal sinus rhythm at 70 with LAFB. Blood pressure is at goal today. She reports her 11 A1c is in the low 7 range. Cholesterol is also been fairly well controlled. She does have a history of moderate aortic stenosis and is due for repeat echo.  09/12/2017  Joanna Brown was seen today in follow-up.  Over the past year she denies any chest pain or worsening shortness of breath.  Unfortunately her hemoglobin A1c is up from the low sevens to 8.2.  Cholesterol also is higher than goal.  Her total cholesterol is 171, HDL 40, LDL 97 and triglycerides 227.  She reports compliance with pravastatin.  She has moderate aortic stenosis with a mean gradient of 20 mmHg based on echo last year and normal LV function.  She did report one brief episode of chest discomfort with more marked exertion a few days ago.  This was right sided underneath the right breast and was a crampy sensation which improved after resting.  I advised that she monitor that further and if  she has more recurrence we may need to consider stress testing.  12/31/2018  Joanna Brown is seen today for routine follow-up.  She was seen in April 2019.  Since then she reports she was doing fairly well up to about 2 weeks ago.  She has had some worsening shortness of breath, particularly at night and while laying down.  She has a mild nonproductive cough.  She also reports some occasional lower extremity edema.  She gets some shortness of  breath with exertion but denies any chest pain, pressure, heaviness or other anginal symptoms.  EKG today shows incomplete right bundle branch pattern.  Her diabetes not well controlled with A1c recently of 8.8.  Her recent cholesterol profile in February showed total cholesterol 145, triglycerides 225, HDL 41 and LDL 73.  She has known aortic stenosis which is at least moderate however not assessed since 2018 by echo.  04/30/2019  Joanna Brown returns today for follow-up.  She had an echo in August 2020 which showed an EF 50 to 55%, moderate LVH, grade 1 diastolic dysfunction.  There was moderate to severe aortic stenosis with a mean gradient of 21 mmHg, AVA by VTI was 1 cm with a dimensionless index of 0.21.  Symptom wise she does report some shortness of breath with exertion but is not consistent.  She has required some pillows to elevate her head at night.  She denies any chest pain at rest.  She has had no presyncopal or syncopal episodes.  10/24/2019  Joanna Brown is seen today in follow-up.  She reports that she has had some chest pain on and off for the past 2 weeks.  She thought initially it might be something that she ate however she has had several episodes with doing house work and other activities that do improve with rest.  She was supposed to have a repeat echo in December and actually is scheduled for repeat echo next week.  On exam today her aortic valve sounds moderately severe.  These could be symptoms associated with the valve.  EKG was personally reviewed shows no new ischemic changes.  11/27/2019  Joanna Brown is seen today in follow-up.  She recently had an echocardiogram to evaluate her aortic stenosis.  As expected the valve is moderately severe.  LVEF is normal with mean gradient 29 mmHg and dimensionless index of 0.26.  Aortic valve area 0.91 cm.  This is more consistent with moderate to severe AS and it appeared visually severely stenotic suggesting possible low-flow low gradient severe  aortic stenosis.  After discussing with her today does not seem like she is having any exertional symptoms, chest pain, heart failure or presyncopal symptoms.  Unfortunately recently she underwent a colonoscopy was found to have a cancerous polyp.  That was removed however a surgical consultation was recommended.  The patient is hesitant with her surgical recommendation and wishes to see another specialist in [place] if possible.  04/16/2020  Ms. Epling returns today for follow-up.  She is due for repeat echo for aortic stenosis but not until January when it scheduled.  She denies any recurrent chest pain or worsening shortness of breath.  She says she is fairly active.  Was felt that she might have a more severe aortic stenosis with a low gradient based on her last echo in June.  Apparently no further work-up was performed regarding her colonoscopy which was abnormal.  She never had surgery because the colon was not appropriately marked.  She is supposed to have another  colonoscopy.  09/24/2020  Ms. Bloyd is seen today in follow-up as an urgent visit.  Over the past week or so she is noted worsening fatigue and shortness of breath.  She also had an episode that lasted about 30 minutes of left-sided chest discomfort which was felt like a pressure and associated with shortness of breath that eventually resolved.  This occurred at rest.  Over the past several months she has had some decline in her exercise ability.  She used to walk regularly but has backed off on that secondary to this.  Her last echo in January did show moderate to severe aortic stenosis, with a mean gradient of 5 mmHg up from 28 mmHg.  I had recommended a repeat echo in 6 months which would have been July 2022.  03/15/2021  Ms. Dickmann returns today for follow-up of her aortic stenosis.  She has been seen recently by Dr. Clifton James and was noted to have severe aortic stenosis, thought to be a poor surgical candidate but potentially  good candidate for TAVR.  There were concerns about access for her valve.  She has an appointment with Dr. Laneta Simmers with surgery on November 7 to discuss this further.  Hopefully she will be a TAVR candidate through a vascular approach.  She denies any worsening shortness of breath or chest pain.  She is had no syncopal episodes.  She has been having some headaches and has been worked up by her PCP.  Is unclear whether this may be related to her moderate to severe aortic stenosis.  10/07/2021  Ms. Knipple returns today for follow-up.  She underwent TAVR for aortic stenosis back on March 23, 2021 with a 23 mm Edwards SAPIEN 3 valve.  Subsequent follow-up shows normal leaflet function with a 12 mmHg gradient and no perivalvular leak.  Overall she has done well without any worsening chest pain or shortness of breath.  Blood pressure appears well controlled.  Her A1c was 7.1%.  LDL most recently was 90.  02/15/2023  Ms. Abeyta is seen today in follow-up.  Recently she saw her PCP and had some lab work done.  This showed extremely high cholesterol with total 266, HDL 39, triglycerides 194 and LDL 190.  About a year ago her LDL was at 90.  She says she has been compliant with her meds although it does indicate in the chart that her Zetia and her pravastatin were stopped earlier this year.  Her PCP then added rosuvastatin 20 mg and fenofibrate.  Since starting these medicines she says she has been having stomach upset and has not felt well with them.  This was only a few weeks ago.  Her blood pressure is high today 168/80.  I rechecked it at 160/70.  She was noted on echo last year to have some aortic insufficiency which was new after her TAVR surgery.  She reports some intermittent shortness of breath with exertion but no swelling or other heart failure symptoms.  05/04/2023  Mrs. Siguenza is seen today in follow-up.  When I last saw her she was noted to have some perivalvular leak.  She has had some  intermittent shortness of breath.  I ordered a repeat echo which now shows mild to moderate perivalvular leak.  LV function was normal.  Blood pressure has improved.  We initially discussed using chlorthalidone however she is currently on eplerenone.  Today blood pressure was 144/72.  Her lipids are also better controlled after readjusting medications.  Total cholesterol now 157,  triglycerides 187, HDL 43 and LDL 82 (down from 190).  PMHx:  Past Medical History:  Diagnosis Date   Anginal pain (HCC)    Arthritis    OA   Diabetes mellitus    Dyspnea    Female bladder prolapse    GERD (gastroesophageal reflux disease)    Glaucoma    Hyperlipidemia    Hypertension    echo and stress 4/10 reports on chart, EKG ` LOV 9/12 on chart   S/P TAVR (transcatheter aortic valve replacement) 03/23/2021   Edwards 23mm S3U TF approach with Dr. Clifton James and Dr. Laneta Simmers   Severe aortic stenosis    Thyroid disease     Past Surgical History:  Procedure Laterality Date   ABDOMINAL HYSTERECTOMY     ANTERIOR AND POSTERIOR REPAIR  04/26/2011   Procedure: ANTERIOR (CYSTOCELE) AND POSTERIOR REPAIR (RECTOCELE);  Brown: Martina Sinner, MD;  Location: [facility];  Service: Urology;  Laterality: N/A;   BIOPSY  05/25/2020   Procedure: BIOPSY;  Brown: Sherrilyn Rist, MD;  Location: [facility];  Service: Gastroenterology;;   CATARACT EXTRACTION Bilateral    with IOL   CHOLECYSTECTOMY  2009   COLONOSCOPY N/A 12/02/2013   three colon polyps removed, small internal hemorrhoids. Hyperplastic polyps   COLONOSCOPY N/A 11/20/2019   pancolonic diverticulosis, two 10-11 mm polyps in ascending colon, one 5 mm polyp in cecum, ascending colon with superficially invasive adenocarcinoma arising in background of sessile serrated polyps with low and high grade cytologic dysplasia.   COLONOSCOPY     COLONOSCOPY W/ POLYPECTOMY     COLONOSCOPY WITH PROPOFOL N/A 05/25/2020   diverticulosis in right colon, redundant colon.  Caution warranted on future colonoscopy in light of age, cardiac condition, challenging anatomy.    DILATION AND CURETTAGE OF UTERUS     pt states this was in the 1970's   ESOPHAGOGASTRODUODENOSCOPY (EGD) WITH PROPOFOL N/A 05/25/2020   Grade 1 esophageal varices, single mucosal nodule in stomach s/p biopsy. (hyperplastic)>    LEFT HEART CATH  09/10/2008   normal coronary arteries, normal LV systolic function, EF 65% (Dr. Claudia Desanctis)   LYMPH NODE DISSECTION Right 1997   under arm   NM MYOCAR PERF WALL MOTION  2010   dipyridamole - mild-mod in intenstiy perfusion defect in mid anterior, mid anteroseptal wall, EF 70%   OVARY SURGERY     bilateral tumors removed   POLYPECTOMY  11/20/2019   Procedure: POLYPECTOMY;  Brown: Corbin Ade, MD;  Location: [facility];  Service: Endoscopy;;  hot and cold snare cecal polyp, and asending polyps x 2   RIGHT/LEFT HEART CATH AND CORONARY ANGIOGRAPHY N/A 10/02/2020   Procedure: RIGHT/LEFT HEART CATH AND CORONARY ANGIOGRAPHY;  Brown: [place], Peter M, MD;  Location: [facility] INVASIVE CV LAB;  Service: Cardiovascular;  Laterality: N/A;   THYROIDECTOMY     THYROIDECTOMY  02/2008   TRANSCATHETER AORTIC VALVE REPLACEMENT, TRANSFEMORAL N/A 03/23/2021   Procedure: TRANSCATHETER AORTIC VALVE REPLACEMENT, TRANSFEMORAL;  Brown: Kathleene Hazel, MD;  Location: [facility] INVASIVE CV LAB;  Service: Open Heart Surgery;  Laterality: N/A;   TRANSTHORACIC ECHOCARDIOGRAM  08/2011   EF=>55%, mild conc LVH; trace MR; mild TR; mild-mod AV calcification with mild valvular AV stenosis   VAGINAL PROLAPSE REPAIR  04/26/2011   Procedure: VAGINAL VAULT SUSPENSION;  Brown: Martina Sinner, MD;  Location: [facility];  Service: Urology;  Laterality: N/A;  with Graft  10x6    FAMHx:  Family History  Problem Relation Age of  Onset   Stomach cancer Mother 55   Heart disease Mother 55       heart disease   Heart disease Father 68       MI   Stroke Maternal Grandfather    Heart  attack Paternal Grandfather    Hypertension Brother    Bone cancer Brother    Hypertension Sister    Liver cancer Sister    Hypertension Sister    Hypertension Child    Colon cancer Neg Hx    Colon polyps Neg Hx    Pancreatic cancer Neg Hx    Esophageal cancer Neg Hx    Rectal cancer Neg Hx     SOCHx:   reports that she has never smoked. She has never used smokeless tobacco. She reports that she does not currently use alcohol. She reports that she does not use drugs.  ALLERGIES:  Allergies  Allergen Reactions   Senokot Wheat Bran [Wheat]     ABDOMINAL CRAMPS   Spironolactone     Stomach problems, vision changes     ROS: Pertinent items noted in HPI and remainder of comprehensive ROS otherwise negative.  HOME MEDS: Current Outpatient Medications  Medication Sig Dispense Refill   ALPHAGAN P 0.1 % SOLN PLACE 1 DROP INTO BOTH EYES EVERY MORNING     amLODipine (NORVASC) 10 MG tablet TAKE 1 TABLET(10 MG) BY MOUTH EVERY MORNING 90 tablet 3   benazepril (LOTENSIN) 40 MG tablet TAKE 1 TABLET(40 MG) BY MOUTH DAILY 90 tablet 3   Blood Glucose Monitoring Suppl (ACCU-CHEK GUIDE) w/Device KIT 1 each by Does not apply route 4 (four) times daily. 1 kit 0   carvedilol (COREG) 12.5 MG tablet Take 1 tablet (12.5 mg total) by mouth 2 (two) times daily with a meal. 60 tablet 3   cholecalciferol (VITAMIN D3) 25 MCG (1000 UT) tablet Take 1,000 Units by mouth in the morning.     clopidogrel (PLAVIX) 75 MG tablet Take 1 tablet (75 mg total) by mouth daily. 90 tablet 3   Continuous Glucose Receiver (FREESTYLE LIBRE 3 READER) DEVI Use to test BG 4 + times daily. E11.65 1 each 0   dapagliflozin propanediol (FARXIGA) 5 MG TABS tablet Take 5 mg by mouth daily.     eplerenone (INSPRA) 25 MG tablet Take 12.5 mg by mouth daily.     ezetimibe (ZETIA) 10 MG tablet Take 1 tablet (10 mg total) by mouth daily. 90 tablet 3   glucose blood (ACCU-CHEK GUIDE TEST) test strip USE TO CHECK BLOOD SUGAR IN THE MORNING,  AT NOON, IN THE EVENING AND AT BEDTIME 150 strip 2   insulin isophane & regular human KwikPen (HUMULIN 70/30 KWIKPEN) (70-30) 100 UNIT/ML KwikPen INJECT 40 UNITS INTO THE SKIN WITH BREAKFAST AND SUPPER WHEN GLUCOSE IS READING ABOVE 90 30 mL 0   Insulin Pen Needle (B-D ULTRAFINE III SHORT PEN) 31G X 8 MM MISC USE WITH INSULIN TWICE DAILY AS DIRECTED 200 each 0   latanoprost (XALATAN) 0.005 % ophthalmic solution Place 1 drop into both eyes at bedtime.      lubiprostone (AMITIZA) 24 MCG capsule Take 1 capsule (24 mcg total) by mouth 2 (two) times daily with a meal. 60 capsule 3   metFORMIN (GLUCOPHAGE) 500 MG tablet TAKE 1 TABLET(500 MG) BY MOUTH DAILY WITH BREAKFAST 90 tablet 1   Multiple Vitamin (MULTIVITAMIN WITH MINERALS) TABS tablet Take 1 tablet by mouth daily.     Omega-3 Fatty Acids (FISH OIL) 1200 MG CAPS Take  1,200 mg by mouth every morning.     pantoprazole (PROTONIX) 40 MG tablet Take 1 tablet (40 mg total) by mouth daily. 30 minutes breakfast 90 tablet 3   pravastatin (PRAVACHOL) 80 MG tablet Take 1 tablet (80 mg total) by mouth every evening. 90 tablet 3   No current facility-administered medications for this visit.    LABS/IMAGING: No results found for this or any previous visit (from the past 48 hours). No results found.  VITALS: BP (!) 144/72 (BP Location: Right Arm, Patient Position: Sitting, Cuff Size: Normal)   Pulse 73   Ht 5\' 5"  (1.651 m)   Wt 175 lb (79.4 kg)   SpO2 98%   BMI 29.12 kg/m   EXAM: General appearance: alert and no distress Neck: no carotid bruit and no JVD Lungs: clear to auscultation bilaterally Heart: regular rate and rhythm, s1/s2, 3/6 diastolic murmur at the LLSB Abdomen: soft, non-tender; bowel sounds normal; no masses,  no organomegaly Extremities: extremities normal, atraumatic, no cyanosis or edema Pulses: 2+ and symmetric Skin: Skin color, texture, turgor normal. No rashes or lesions Neurologic: Grossly normal Psych: Mood, affect  normal  EKG: Deferred  ASSESSMENT: Severe aortic stenosis status post 23 mm Edwards SAPIEN 3 transcatheter heart valve replacement (03/23/2021)-noted to have mild AI by echo in 2023 Hypertension Dyslipidemia  NAFLD GERD DM2 on insulin History of medication non-compliance  PLAN: 1.   Mrs. Tiggs has better blood pressure control today.  Her cholesterol is now much better after adjusting her meds and apparent better compliance.  I do have a concern about her perivalvular leak which now demonstrates mild to moderate aortic insufficiency.  I am not sure if much can be done with this but I will message Dr. Clifton James to see if he has any suggestions.  Plan follow-up in 6 months or sooner as necessary.  Chrystie Nose, MD, [facility], FACP  [place]  [facility]   Medical Director of the [facility] Lipid [facility] &  Cardiovascular Risk Reduction Clinic Diplomate of the American Board of Clinical Lipidology Attending Cardiologist  Direct Dial: [phone]  Fax: [phone]  Website:  [email or web address] Marjorie Deprey 05/04/2023, 1:45 PM

## 2023-05-04 NOTE — Patient Instructions (Addendum)
Medication Instructions:  No changes *If you need a refill on your cardiac medications before your next appointment, please call your pharmacy*   Lab Work: None needed  If you have labs (blood work) drawn today and your tests are completely normal, you will receive your results only by: MyChart Message (if you have MyChart) OR A paper copy in the mail If you have any lab test that is abnormal or we need to change your treatment, we will call you to review the results.   Testing/Procedures: None    Follow-Up: At [facility], you and your health needs are our priority.  As part of our continuing mission to provide you with exceptional heart care, we have created designated Provider Care Teams.  These Care Teams include your primary Cardiologist (physician) and Advanced Practice Providers (APPs -  Physician Assistants and Nurse Practitioners) who all work together to provide you with the care you need, when you need it.    Your next appointment:   6 month(s) with [facility] or Azalee Course   Provider:   Chrystie Nose, MD

## 2023-05-08 DIAGNOSIS — I129 Hypertensive chronic kidney disease with stage 1 through stage 4 chronic kidney disease, or unspecified chronic kidney disease: Secondary | ICD-10-CM | POA: Diagnosis not present

## 2023-05-08 DIAGNOSIS — E876 Hypokalemia: Secondary | ICD-10-CM | POA: Diagnosis not present

## 2023-05-08 DIAGNOSIS — I5032 Chronic diastolic (congestive) heart failure: Secondary | ICD-10-CM | POA: Diagnosis not present

## 2023-05-08 DIAGNOSIS — E1122 Type 2 diabetes mellitus with diabetic chronic kidney disease: Secondary | ICD-10-CM | POA: Diagnosis not present

## 2023-05-12 ENCOUNTER — Ambulatory Visit (INDEPENDENT_AMBULATORY_CARE_PROVIDER_SITE_OTHER): Payer: Medicare Other

## 2023-05-12 DIAGNOSIS — B351 Tinea unguium: Secondary | ICD-10-CM

## 2023-05-12 NOTE — Patient Instructions (Signed)
Follow-up care is important to the success of your foot laser treatment. Please keep these instructions for future reference.  ° ° °Normal activity can resume immediately.  ° °2. IF you doctor prescribed an antifungal cream apply medication to the skin on the bottom of your feet, sides of your feet, between your toes, and around your nails. (This cream is not intended for use on your nails) Apply cream as directed ° °3. IF a topical for your nails was prescribed by your doctor, apply to nail/nails once daily until nail is free from infection unless otherwise directed by your doctor. Maximum use for nail topical is 12 months.  ° °4. Spray the insides of your shoes with an over the counter antifungal spray or Lysol disinfectant (aerosol can) at the end of each day or use an ultra violet shoe sterilizer as directed. Try not to wear the same shoes everyday.  ° °5. Buy new nail clippers and files since your current pair may be infected. Metal nail care instruments may also be cleaned with diluted bleach or boiling water. DO NOT share nail clippers or nail files. ° °6. Keep your toenails trimmed and clean.  ° °7. Wear flip-flops in public places especially hotel rooms and showers, athletic club locker rooms and showers and indoor swimming pools.  ° °8. Avoid nail salons that do not clean their instruments properly or use a whirlpool system.   °

## 2023-05-12 NOTE — Progress Notes (Signed)
Patient presents today for the 2nd laser treatment. Diagnosed with mycotic nail infection by Dr. Lilian Kapur.   Toenail most affected 1st toe bilaterally.  All other systems are negative.  Patient declined trim and smooth. Laser therapy was administered to 1st toenails bilaterally and patient tolerated the treatment well. All safety precautions were in place.     Follow up in 4 weeks for laser # 3. MD wants patient to have 4x treatments. This was 2 of 4.

## 2023-05-23 ENCOUNTER — Other Ambulatory Visit: Payer: Self-pay | Admitting: "Endocrinology

## 2023-05-23 DIAGNOSIS — E1122 Type 2 diabetes mellitus with diabetic chronic kidney disease: Secondary | ICD-10-CM

## 2023-05-25 ENCOUNTER — Ambulatory Visit: Payer: Medicare Other | Admitting: Family Medicine

## 2023-05-26 DIAGNOSIS — I10 Essential (primary) hypertension: Secondary | ICD-10-CM | POA: Diagnosis not present

## 2023-05-26 DIAGNOSIS — E782 Mixed hyperlipidemia: Secondary | ICD-10-CM | POA: Diagnosis not present

## 2023-05-27 LAB — LIPID PANEL
Chol/HDL Ratio: 4 {ratio} (ref 0.0–4.4)
Cholesterol, Total: 162 mg/dL (ref 100–199)
HDL: 41 mg/dL (ref >39)
LDL Chol Calc (NIH): 89 mg/dL (ref 0–99)
Triglycerides: 184 mg/dL — ABNORMAL HIGH (ref 0–149)
VLDL Cholesterol Cal: 32 mg/dL (ref 5–40)

## 2023-05-27 LAB — CMP14+EGFR
ALT: 14 [IU]/L (ref 0–32)
AST: 18 [IU]/L (ref 0–40)
Albumin: 4.5 g/dL (ref 3.7–4.7)
Alkaline Phosphatase: 53 [IU]/L (ref 44–121)
BUN/Creatinine Ratio: 14 (ref 12–28)
BUN: 16 mg/dL (ref 8–27)
Bilirubin Total: 0.4 mg/dL (ref 0.0–1.2)
CO2: 29 mmol/L (ref 20–29)
Calcium: 10 mg/dL (ref 8.7–10.3)
Chloride: 98 mmol/L (ref 96–106)
Creatinine, Ser: 1.15 mg/dL — ABNORMAL HIGH (ref 0.57–1.00)
Globulin, Total: 3 g/dL (ref 1.5–4.5)
Glucose: 192 mg/dL — ABNORMAL HIGH (ref 70–99)
Potassium: 3.7 mmol/L (ref 3.5–5.2)
Sodium: 144 mmol/L (ref 134–144)
Total Protein: 7.5 g/dL (ref 6.0–8.5)
eGFR: 48 mL/min/{1.73_m2} — ABNORMAL LOW (ref >59)

## 2023-06-01 ENCOUNTER — Other Ambulatory Visit: Payer: Self-pay | Admitting: "Endocrinology

## 2023-06-01 NOTE — Telephone Encounter (Signed)
Pt's last visit documents metformin 500mg  bid but her last Rx is for 500mg  every day. Please advise.

## 2023-06-04 ENCOUNTER — Other Ambulatory Visit: Payer: Self-pay | Admitting: Family Medicine

## 2023-06-06 ENCOUNTER — Encounter: Payer: Self-pay | Admitting: Podiatry

## 2023-06-06 ENCOUNTER — Ambulatory Visit (INDEPENDENT_AMBULATORY_CARE_PROVIDER_SITE_OTHER): Payer: Medicare Other | Admitting: Podiatry

## 2023-06-06 DIAGNOSIS — E1142 Type 2 diabetes mellitus with diabetic polyneuropathy: Secondary | ICD-10-CM | POA: Diagnosis not present

## 2023-06-06 DIAGNOSIS — M79675 Pain in left toe(s): Secondary | ICD-10-CM

## 2023-06-06 DIAGNOSIS — B351 Tinea unguium: Secondary | ICD-10-CM | POA: Diagnosis not present

## 2023-06-06 DIAGNOSIS — M79674 Pain in right toe(s): Secondary | ICD-10-CM | POA: Diagnosis not present

## 2023-06-06 DIAGNOSIS — Z794 Long term (current) use of insulin: Secondary | ICD-10-CM | POA: Diagnosis not present

## 2023-06-06 DIAGNOSIS — N183 Chronic kidney disease, stage 3 unspecified: Secondary | ICD-10-CM

## 2023-06-07 ENCOUNTER — Encounter: Payer: Self-pay | Admitting: Family Medicine

## 2023-06-07 ENCOUNTER — Ambulatory Visit (INDEPENDENT_AMBULATORY_CARE_PROVIDER_SITE_OTHER): Payer: Medicare Other | Admitting: Family Medicine

## 2023-06-07 VITALS — BP 128/78 | HR 68 | Ht 65.0 in | Wt 172.1 lb

## 2023-06-07 DIAGNOSIS — R9431 Abnormal electrocardiogram [ECG] [EKG]: Secondary | ICD-10-CM | POA: Diagnosis not present

## 2023-06-07 DIAGNOSIS — E782 Mixed hyperlipidemia: Secondary | ICD-10-CM

## 2023-06-07 DIAGNOSIS — Z794 Long term (current) use of insulin: Secondary | ICD-10-CM

## 2023-06-07 DIAGNOSIS — R0602 Shortness of breath: Secondary | ICD-10-CM

## 2023-06-07 DIAGNOSIS — E1169 Type 2 diabetes mellitus with other specified complication: Secondary | ICD-10-CM

## 2023-06-07 DIAGNOSIS — Z9189 Other specified personal risk factors, not elsewhere classified: Secondary | ICD-10-CM | POA: Diagnosis not present

## 2023-06-07 NOTE — Assessment & Plan Note (Signed)
Diabetes associated with hypertension, hyperlipidemia, obesity, and heart disease  [name] is reminded of the importance of commitment to daily physical activity for 30 minutes or more, as able and the need to limit carbohydrate intake to 30 to 60 grams per meal to help with blood sugar control.   The need to take medication as prescribed, test blood sugar as directed, and to call between visits if there is a concern that blood sugar is uncontrolled is also discussed.   [name] is reminded of the importance of daily foot exam, annual eye examination, and good blood sugar, blood pressure and cholesterol control.     Latest Ref Rng & Units 05/26/2023    8:06 AM 03/23/2023   10:35 AM 03/16/2023    8:07 AM 03/09/2023   12:32 PM 01/25/2023    8:07 AM  Diabetic Labs  HbA1c 0.0 - 7.0 %  7.7      Chol 100 - 199 mg/dL 409   811   914   HDL >78 mg/dL 41   43   39   Calc LDL 0 - 99 mg/dL 89   82   295   Triglycerides 0 - 149 mg/dL 621   308   657   Creatinine 0.57 - 1.00 mg/dL 8.46   9.62  9.52  8.41       06/07/2023    9:53 AM 06/07/2023    9:09 AM 06/07/2023    9:08 AM 05/04/2023    1:30 PM 04/10/2023   12:41 AM 04/09/2023   10:12 PM 04/09/2023   10:10 PM  BP/Weight  Systolic BP 128 145 144 144 140  149  Diastolic BP 78 77 71 72 74  75  Wt. (Lbs)   172.12 175  178.57   BMI   28.64 kg/m2 29.12 kg/m2  29.72 kg/m2       Latest Ref Rng & Units 08/03/2021    9:20 AM 03/09/2021   12:00 AM  Foot/eye exam completion dates  Eye Exam No Retinopathy  No Retinopathy      Foot Form Completion  Done      This result is from an external source.      Uncontrolled , managed by Endo, reports erratic eating habits and self adjustment of insulin, does agree to go to diabetic ed and is referred

## 2023-06-07 NOTE — Patient Instructions (Addendum)
F/U in 4 months, call if  you need me sooner  You are referred to diabetic educator, very important that you make the most of this very ueful resource, it will get your blood sugar controlled  EKG in office today due to 2 week h/o chest discomfort with activity and urgent referral to Cardiology. Go to ED if chest  pain occurs and will not go away  No `walking till you see cardiology  Thanks for choosing [facility], we consider it a privelige to serve you.

## 2023-06-11 NOTE — Progress Notes (Signed)
  Subjective:  Patient ID: Joanna Brown, female    DOB: 1940-07-17,  MRN: [id number]  Joanna Brown presents to clinic today for: at risk foot care. Pt has h/o NIDDM with chronic kidney disease and painful mycotic toenails x 10 which interfere with daily activities. Pain is relieved with periodic professional debridement.  Chief Complaint  Patient presents with   Nail Problem    Patient states she last saw her PCP 4 months ago, patient states her PCP name is Syliva Overman     PCP is Kerri Perches, MD.  Allergies  Allergen Reactions   Senokot Wheat Bran [Wheat]     ABDOMINAL CRAMPS   Spironolactone     Stomach problems, vision changes     Review of Systems: Negative except as noted in the HPI.  Objective: No changes noted in today's physical examination. There were no vitals filed for this visit.  Joanna Brown is a pleasant 83 y.o. female in NAD. AAO x 3.  Vascular Examination: Capillary refill time <3 seconds b/l LE. Palpable pedal pulses b/l LE. Digital hair decreased b/l. No pedal edema b/l. Skin temperature gradient WNL b/l. No varicosities b/l. Marland Kitchen  Dermatological Examination: Pedal skin with normal turgor, texture and tone b/l. No open wounds. No interdigital macerations b/l. Toenails 1-5 b/l thickened, discolored, dystrophic with subungual debris. There is pain on palpation to dorsal aspect of nailplates. No corns, calluses nor porokeratotic lesions noted..  Neurological Examination: Protective sensation intact with 10 gram monofilament b/l LE.   Musculoskeletal Examination: Muscle strength 5/5 to all lower extremity muscle groups bilaterally. HAV with bunion deformity noted b/l LE.     Latest Ref Rng & Units 03/23/2023   10:35 AM 11/10/2022   10:36 AM 08/03/2022    9:02 AM  Hemoglobin A1C  Hemoglobin-A1c 0.0 - 7.0 % 7.7  6.8  7.8    Assessment/Plan: 1. Pain due to onychomycosis of toenails of both feet   2. Diabetes mellitus due to underlying  condition with stage 3 chronic kidney disease, with long-term current use of insulin, unspecified whether stage 3a or 3b CKD (HCC)     -Consent given for treatment as described below: -Examined patient. -Continue supportive shoe gear daily. -Mycotic toenails 1-5 bilaterally were debrided in length and girth with sterile nail nippers and dremel without incident. -Patient/POA to call should there be question/concern in the interim.   Return in about 3 months (around 09/04/2023).  Freddie Breech, DPM       LOCATION: [address] [place] [address]                   Office [phone]   [facility] LOCATION: [address] [place], [place] [address] Office [phone]

## 2023-06-19 ENCOUNTER — Ambulatory Visit: Payer: Medicare Other | Attending: Physician Assistant | Admitting: Emergency Medicine

## 2023-06-19 ENCOUNTER — Encounter: Payer: Self-pay | Admitting: Emergency Medicine

## 2023-06-19 VITALS — BP 132/60 | HR 66 | Ht 65.0 in | Wt 173.6 lb

## 2023-06-19 DIAGNOSIS — I639 Cerebral infarction, unspecified: Secondary | ICD-10-CM | POA: Diagnosis not present

## 2023-06-19 DIAGNOSIS — Z952 Presence of prosthetic heart valve: Secondary | ICD-10-CM | POA: Insufficient documentation

## 2023-06-19 DIAGNOSIS — I2510 Atherosclerotic heart disease of native coronary artery without angina pectoris: Secondary | ICD-10-CM | POA: Insufficient documentation

## 2023-06-19 DIAGNOSIS — I10 Essential (primary) hypertension: Secondary | ICD-10-CM | POA: Diagnosis not present

## 2023-06-19 DIAGNOSIS — T8203XD Leakage of heart valve prosthesis, subsequent encounter: Secondary | ICD-10-CM | POA: Insufficient documentation

## 2023-06-19 DIAGNOSIS — E785 Hyperlipidemia, unspecified: Secondary | ICD-10-CM | POA: Diagnosis not present

## 2023-06-19 DIAGNOSIS — R072 Precordial pain: Secondary | ICD-10-CM | POA: Diagnosis not present

## 2023-06-19 MED ORDER — METOPROLOL TARTRATE 50 MG PO TABS: 50.0000 mg | ORAL_TABLET | Freq: Once | ORAL | 0 refills | Status: DC

## 2023-06-19 NOTE — Progress Notes (Signed)
Cardiology Office Note:    Date:  06/19/2023  ID:  Joanna Brown, DOB [birth year], MRN [id number] PCP: Kerri Perches, MD  [facility] Providers Cardiologist:  Chrystie Nose, MD        Patient Profile:      Joanna Brown is a 83 y.o. female with visit-pertinent history of T2DM, GERD, mild CAD, HLD, HTN, CVA, aortic stenosis s/p TAVR 03/2021, CKD, POAG  She was referred to Dr. Clifton James 01/20/2021 for evaluation of aortic stenosis.  Cardiac catheterization 09/2020 showed mild CAD with 30% proximal LAD, 20% ostial left circumflex artery, 25% proximal RCA, normal cardiac output, normal right and left heart pressures, mean gradient across aortic valve 26.6 mmHg by cath.  Echo 01/2021 showed EF at 55-60%, moderate to severe aortic stenosis with mean gradient 25 mmHg, peak gradient 43.6 mmHg.  At the time of evaluation she was having fatigue and DOE.  She underwent TAVR for aortic stenosis on March 23, 2021 with a 23 mm Edwards SAPIEN 3 valve.  Postoperative echocardiogram shows EF 60%, normal leaflet function with a 17 mmHg gradient and no perivalvular leak, as well as severe MAC with mild MS.  Subsequent 1 month echo showed EF 55%, normally functioning TAVR with a mean gradient of 12 mmHg and no PVL.  She was admitted 12/2021 for dizziness and diagnosed with a TIA, started on DAPT x 3 weeks followed by Plavix monotherapy.  Most recent echocardiogram in the setting of SOB on 03/10/2023 showed LVEF 55%, grade 1 DD, RV SF normal, normal PASP, mild MR, moderate MAC, mild to moderate perivalvular leak with mean gradient 15 mmHg.  Last seen in clinic on 05/04/2023 by Dr. Rennis Golden.  She was noted to have some intermittent shortness of breath.  Blood pressure has slightly improved however still elevated at 144/72.  LDL cholesterol was noted to have been vastly improved at 82 down from 190.      History of Present Illness:  Discussed the use of AI scribe software for clinical note transcription with  the patient, who gave verbal consent to proceed.  Joanna Brown is a 83 y.o. female who returns for acute visit for chest pain.  Miss Radliff, presents with intermittent shortness of breath, chest heaviness, and fatigue over the past two weeks. The chest heaviness is described as a dull pain/tightness, located under the left breast, and can occur at any time, typically at rest and no episodes on exertion.  The chest pain can last a full day to several days in a row.  Her most strenuous activity within the last 2 weeks was going to the grocery store and bringing groceries inside for which she did not have any exertional chest pain or worsening of her SOB.  Her shortness of breath is chronic and has been stable.  She denies any worsening of her shortness of breath or DOE.  The patient also reports a cough that has been ongoing for several years.  She has been without any lower extremity swelling, syncope, near syncope, dizziness.  She tells me that she had recently stopped taking her eplerenone due to itching and stopped about a month ago and she is unsure if she has been taking her benazepril.    Review of Systems  Constitutional: Negative for weight gain and weight loss.  Cardiovascular:  Positive for chest pain and dyspnea on exertion. Negative for claudication, irregular heartbeat, leg swelling, near-syncope, orthopnea, palpitations, paroxysmal nocturnal dyspnea and syncope.  Respiratory:  Positive  for shortness of breath. Negative for hemoptysis.   Gastrointestinal:  Negative for abdominal pain, hematochezia and melena.  Genitourinary:  Negative for hematuria.  Neurological:  Negative for dizziness and light-headedness.     See HPI     Home Medications:    Prior to Admission medications   Medication Sig Start Date End Date Taking? Authorizing Provider  ALPHAGAN P 0.1 % SOLN PLACE 1 DROP INTO BOTH EYES EVERY MORNING 03/21/23   [provider]  amLODipine (NORVASC) 10 MG tablet TAKE  1 TABLET(10 MG) BY MOUTH EVERY MORNING 08/22/22   Kerri Perches, MD  benazepril (LOTENSIN) 40 MG tablet TAKE 1 TABLET(40 MG) BY MOUTH DAILY 08/23/22   Kerri Perches, MD  Blood Glucose Monitoring Suppl (ACCU-CHEK GUIDE) w/Device KIT 1 each by Does not apply route 4 (four) times daily. 01/06/23   Roma Kayser, MD  carvedilol (COREG) 12.5 MG tablet TAKE 1 TABLET(12.5 MG) BY MOUTH TWICE DAILY WITH A MEAL 06/05/23   Kerri Perches, MD  cholecalciferol (VITAMIN D3) 25 MCG (1000 UT) tablet Take 1,000 Units by mouth in the morning.    [provider]  clopidogrel (PLAVIX) 75 MG tablet Take 1 tablet (75 mg total) by mouth daily. 06/28/22   Kerri Perches, MD  Continuous Glucose Receiver (FREESTYLE LIBRE 3 READER) DEVI Use to test BG 4 + times daily. E11.65 01/12/23   Roma Kayser, MD  dapagliflozin propanediol (FARXIGA) 5 MG TABS tablet Take 5 mg by mouth daily.    [provider]  eplerenone (INSPRA) 25 MG tablet Take 12.5 mg by mouth daily.    [provider]  ezetimibe (ZETIA) 10 MG tablet Take 1 tablet (10 mg total) by mouth daily. 02/15/23 02/10/24  Hilty, Lisette Abu, MD  glucose blood (ACCU-CHEK GUIDE TEST) test strip USE TO CHECK BLOOD SUGAR IN THE MORNING, AT NOON, IN THE EVENING AND AT BEDTIME 04/24/23   Roma Kayser, MD  insulin isophane & regular human KwikPen (HUMULIN 70/30 KWIKPEN) (70-30) 100 UNIT/ML KwikPen INJECT 50 UNITS WITH BREAKFAST AND 40 UNITS WITH SUPPER WHEN GLUCOSE READING ABOVE 90 05/23/23   Nida, Denman George, MD  Insulin Pen Needle (B-D ULTRAFINE III SHORT PEN) 31G X 8 MM MISC USE WITH INSULIN TWICE DAILY AS DIRECTED 03/20/23   Nida, Denman George, MD  latanoprost (XALATAN) 0.005 % ophthalmic solution Place 1 drop into both eyes at bedtime.  02/18/19   [provider]  lubiprostone (AMITIZA) 24 MCG capsule Take 1 capsule (24 mcg total) by mouth 2 (two) times daily with a meal. 03/30/23   Gelene Mink, NP   metFORMIN (GLUCOPHAGE) 500 MG tablet TAKE 1 TABLET(500 MG) BY MOUTH DAILY WITH BREAKFAST 06/01/23   Roma Kayser, MD  Multiple Vitamin (MULTIVITAMIN WITH MINERALS) TABS tablet Take 1 tablet by mouth daily.    [provider]  Omega-3 Fatty Acids (FISH OIL) 1200 MG CAPS Take 1,200 mg by mouth every morning.    [provider]  pantoprazole (PROTONIX) 40 MG tablet Take 1 tablet (40 mg total) by mouth daily. 30 minutes breakfast 07/21/22   Gelene Mink, NP  pravastatin (PRAVACHOL) 80 MG tablet Take 1 tablet (80 mg total) by mouth every evening. 02/15/23 05/16/23  Chrystie Nose, MD   Studies Reviewed:       Echocardiogram 03/10/2023 1. Left ventricular ejection fraction, by estimation, is 55%. The left  ventricle has normal function. The left ventricle has no regional wall  motion  abnormalities. Left ventricular diastolic parameters are consistent  with Grade I diastolic dysfunction  (impaired relaxation). The average left ventricular global longitudinal  strain is -19.4 %. The global longitudinal strain is normal.   2. Right ventricular systolic function is normal. The right ventricular  size is normal. There is normal pulmonary artery systolic pressure. The  estimated right ventricular systolic pressure is 24.2 mmHg.   3. Left atrial size was mildly dilated.   4. The mitral valve is degenerative. Mild mitral valve regurgitation. No  evidence of mitral stenosis. Moderate mitral annular calcification.   5. Bioprosthetic aortic valve s/p TAVR with 23 mm Edwards Sapien 3 THV.  Mean gradient 15 mmHg, DI 0.45. EOA 1.33 cm^2. There is mild-moderate  peri-valvular leakage noted.   6. The inferior vena cava is normal in size with greater than 50%  respiratory variability, suggesting right atrial pressure of 3 mmHg.  Right/Left heart catheterization 10/02/2020 1. Mild nonobstructive CAD 2. Moderate to severe aortic stenosis AV mean gradient 27 mm Hg, peak gradient 30 mm  Hg 3. Severe mitral annular calcification 4. Normal right heart and LV filling pressures.  5. Normal cardiac output.  Diagnostic Dominance: Right  Risk Assessment/Calculations:             Physical Exam:   VS:  BP 132/60 (BP Location: Left Arm, Patient Position: Sitting, Cuff Size: Normal)   Pulse 66   Ht 5\' 5"  (1.651 m)   Wt 173 lb 9.6 oz (78.7 kg)   SpO2 96%   BMI 28.89 kg/m    Wt Readings from Last 3 Encounters:  06/19/23 173 lb 9.6 oz (78.7 kg)  06/07/23 172 lb 1.9 oz (78.1 kg)  05/04/23 175 lb (79.4 kg)    Constitutional:      Appearance: Normal and healthy appearance. Not in distress.  HENT:     Head: Normocephalic.  Neck:     Vascular: JVD normal.  Pulmonary:     Effort: Pulmonary effort is normal.     Breath sounds: Normal breath sounds.  Chest:     Chest wall: Not tender to palpatation.  Cardiovascular:     PMI at left midclavicular line. Normal rate. Regular rhythm. Normal S1. Normal S2.      Murmurs: There is a grade 3/6 harsh midsystolic murmur at the URSB.     No gallop.  No click. No rub.  Pulses:    Intact distal pulses.  Edema:    Peripheral edema absent.  Musculoskeletal: Normal range of motion.     Cervical back: Normal range of motion and neck supple. Skin:    General: Skin is warm and dry.  Neurological:     General: No focal deficit present.     Mental Status: Alert, oriented to person, place, and time and oriented to person, place and time.  Psychiatric:        Mood and Affect: Mood and affect normal.        Behavior: Behavior is cooperative.        Thought Content: Thought content normal.       Assessment and Plan:  Chest Pain / Coronary artery disease Heart catheterization 09/2020 with nonobstructive CAD She presents with new onset atypical chest pain described as heaviness that started 2 weeks ago.  The pain is left-sided located under breast.  The chest pain is non-exertional, starts at rest, lasting a day to several days at a time,  with no radiation.  She denies any alleviating/aggravating factors.  She  notes no change in chronic intermittent SOB/DOE. -Plan for Coronary CTA given new onset chest heaviness and last ischemic evaluation greater than 2.5 years -She will hold carvedilol morning of exam, take 1 dose of metoprolol tartrate 50 mg 2 hours prior, hold metformin day of CTA and 2 days after -Continue Plavix 75 mg daily (Hx CVA)  Severe AS s/p TAVR S/p TAVR 03/2021 with a 23 mm Edwards SAPIEN 3 valve Most recent Echo 02/2023 shows LVEF 55% with mild to moderate perivalvular leak with mean gradient 15 mmHg -She is without worsening of her intermittent chronic SOB/DOE.  Her SOB is stable and does not affect her ADLs or current lifestyle.  She is without any syncope, near syncope, leg swelling, dizziness/lightheadedness, PND.   -She does endorse chest pain as noted above however atypical in nature and not exacerbated on exertion.  Plan to order coronary CTA as above  -She has stable symptoms with most updated echo 4 months ago showing mild to moderate PVL.  She remains with 3/6 midsystolic murmur.  If symptoms do worsen prior to her follow-up in 3 months she will contact office -Continue monotherapy with Plavix given history of CVA -Educated on SBE prophylaxis  Hypertension BP today 132/60 -She noted recently stopping Eplerenone 12.5 mg due to side effect of itching and she had also noted she was unsure if she was taking her benazepril.  We called her pharmacy and the last time she had picked up her prescription for benazepril was greater than 1 year ago. -Will add eplerenone as an allergy and remove benazepril from medication list.  Given her blood pressure is under adequate control today we will continue her current antihypertension medications including amlodipine 10 mg daily and carvedilol 12.5 mg twice daily -Encouraged her to monitor BP at home and alert office for consistent blood pressure greater than  140  Hyperlipidemia LDL 89 on 05/2023 -Continue Pravastatin 80 mg daily and Zetia 10 mg daily  CVA Currently asymptomatic with no new neurological deficits. Managed by PCP.  -Continue Plavix, Pravastatin, Zetia  CKD GFR 48 on 05/26/2023 -Managed by Nephrology              Dispo: Follow-up with your scheduled visit with Dr. Rennis Golden on November 10, 2023  Signed, Denyce Robert, NP

## 2023-06-19 NOTE — Patient Instructions (Addendum)
Medication Instructions:  DISCONTINUED INSPRA  TAKE 50 MG OF METOPROLOL TARTRATE 2 HOURS PRIOR TO PROCEDURE HOLD TAKING CARVEDILOL MORNING OF PROCEDURE HOLD TAKING METFORMIN MORNING OF PROCEDURE AND 2 DAYS AFTER.  *If you need a refill on your cardiac medications before your next appointment, please call your pharmacy*   Lab Work: NO LABS If you have labs (blood work) drawn today and your tests are completely normal, you will receive your results only by: MyChart Message (if you have MyChart) OR A paper copy in the mail If you have any lab test that is abnormal or we need to change your treatment, we will call you to review the results.   Testing/Procedures:   Your cardiac CT will be scheduled at one of the below locations:   [facility] [address] [place], [place] [address] [phone]  If scheduled at [facility], please arrive at the [facility] and Children's Entrance (Entrance C2) of [facility] 30 minutes prior to test start time. You can use the FREE valet parking offered at entrance C (encouraged to control the heart rate for the test)  Proceed to the [facility] Radiology Department (first floor) to check-in and test prep.  All radiology patients and guests should use entrance C2 at [facility], accessed from [facility], even though the hospital's physical address listed is [address]     Please follow these instructions carefully (unless otherwise directed):  An IV will be required for this test and Nitroglycerin will be given.  Hold all erectile dysfunction medications at least 3 days (72 hrs) prior to test. (Ie viagra, cialis, sildenafil, tadalafil, etc)   On the Night Before the Test: Be sure to Drink plenty of water. Do not consume any caffeinated/decaffeinated beverages or chocolate 12 hours prior to your test. Do not take any antihistamines 12 hours prior to your test.  On the Day of the  Test: Drink plenty of water until 1 hour prior to the test. Do not eat any food 1 hour prior to test. You may take your regular medications prior to the test.  Take metoprolol (Lopressor) 50 MG two hours prior to test. If you take Furosemide/Hydrochlorothiazide/Spironolactone/Chlorthalidone, please HOLD on the morning of the test. Patients who wear a continuous glucose monitor MUST remove the device prior to scanning. FEMALES- please wear underwire-free bra if available, avoid dresses & tight clothing  TAKE METOPROLOL 50 MG 2 HOURS PRIOR TO PROCEDURE HOLD TAKING CARVEDILOL MORNING OF PROCEDURE HOLD TAKING METFORMIN MORNING OF PROCEDURE AND 2 DAYS AFTER.      After the Test: Drink plenty of water. After receiving IV contrast, you may experience a mild flushed feeling. This is normal. On occasion, you may experience a mild rash up to 24 hours after the test. This is not dangerous. If this occurs, you can take Benadryl 25 mg, Zyrtec, Claritin, or Allegra and increase your fluid intake. (Patients taking Tikosyn should avoid Benadryl, and may take Zyrtec, Claritin, or Allegra) If you experience trouble breathing, this can be serious. If it is severe call [phone] IMMEDIATELY. If it is mild, please call our office.  We will call to schedule your test 2-4 weeks out understanding that some insurance companies will need an authorization prior to the service being performed.   For more information and frequently asked questions, please visit our website : [email or web address]  For non-scheduling related questions, please contact the cardiac imaging nurse navigator should you have any questions/concerns: Cardiac  Imaging Nurse Navigators Direct Office Dial: [phone]   For scheduling needs, including cancellations and rescheduling, please call [place], [phone].    Follow-Up: At [facility], you and your health needs are our priority.  As part of our continuing mission to  provide you with exceptional heart care, we have created designated Provider Care Teams.  These Care Teams include your primary Cardiologist (physician) and Advanced Practice Providers (APPs -  Physician Assistants and Nurse Practitioners) who all work together to provide you with the care you need, when you need it.  Your next appointment:   KEEP FOLLOW UP November 10 2023  Provider:   Chrystie Nose, MD   Other Instructions

## 2023-06-20 ENCOUNTER — Other Ambulatory Visit: Payer: Self-pay | Admitting: Family Medicine

## 2023-06-21 DIAGNOSIS — R9431 Abnormal electrocardiogram [ECG] [EKG]: Secondary | ICD-10-CM | POA: Insufficient documentation

## 2023-06-21 DIAGNOSIS — R0602 Shortness of breath: Secondary | ICD-10-CM | POA: Insufficient documentation

## 2023-06-21 DIAGNOSIS — Z9189 Other specified personal risk factors, not elsewhere classified: Secondary | ICD-10-CM | POA: Insufficient documentation

## 2023-06-21 NOTE — Assessment & Plan Note (Signed)
 Multiplke co morbidities place pt at increased CAD risk, new symptom onset and abn EKG, cardiology eval asap

## 2023-06-21 NOTE — Assessment & Plan Note (Signed)
 Hyperlipidemia:Low fat diet discussed and encouraged.   Lipid Panel  Lab Results  Component Value Date   CHOL 162 05/26/2023   HDL 41 05/26/2023   LDLCALC 89 05/26/2023   TRIG 184 (H) 05/26/2023   CHOLHDL 4.0 05/26/2023     Needs to reduce fat intake, TG are high

## 2023-06-21 NOTE — Assessment & Plan Note (Signed)
 2 week history, pt at increased risk for CAD, EKG abn,non specific t wave abn and fasciciular block, Cardiology eval asap

## 2023-06-21 NOTE — Assessment & Plan Note (Signed)
 Pt with new symptom pof poor exercise tolerance and abnormalities on EKG which are new, cardiology eval asap

## 2023-06-21 NOTE — Progress Notes (Signed)
Joanna Brown     MRN: [id number]      DOB: [birth year]  Chief Complaint  Patient presents with   Follow-up    Follow up    HPI Joanna Brown is here for follow up and re-evaluation of chronic medical conditions, medication management and review of any available recent lab and radiology data.  Preventive health is updated, specifically  Cancer screening and Immunization.   Questions or concerns regarding consultations or procedures which the PT has had in the interim are  addressed. The PT denies any adverse reactions to current medications since the last visit.  2 week h/o chest discomfort with activity , denies PND or orthopnea or leg swelling Blood sugar still fluctuates and diet , food choice and portion control need review ROS Denies recent fever or chills. Denies sinus pressure, nasal congestion, ear pain or sore throat. Denies chest congestion, productive cough or wheezing.  Denies abdominal pain, nausea, vomiting,diarrhea or constipation.   Denies dysuria, frequency, hesitancy or incontinence. Denies joint pain, swelling and limitation in mobility. Denies headaches, seizures, numbness, or tingling. Denies depression, anxiety or insomnia. Denies skin break down or rash.   PE  BP 128/78   Pulse 68   Ht 5\' 5"  (1.651 m)   Wt 172 lb 1.9 oz (78.1 kg)   SpO2 94%   BMI 28.64 kg/m   Patient alert and oriented and in no cardiopulmonary distress.  HEENT: No facial asymmetry, EOMI,     Neck supple .  Chest: Clear to auscultation bilaterally.  CVS: S1, S2 no murmurs, no S3.Regular rate.  ABD: Soft non tender.   Ext: No edema  MS: Adequate ROM spine, shoulders, hips and knees.  Skin: Intact, no ulcerations or rash noted.  Psych: Good eye contact, normal affect. Memory intact not anxious or depressed appearing.  CNS: CN 2-12 intact, power,  normal throughout.no focal deficits noted.   Assessment & Plan  Type 2 diabetes mellitus with other specified complication  (HCC) Diabetes associated with hypertension, hyperlipidemia, obesity, and heart disease  Joanna Brown is reminded of the importance of commitment to daily physical activity for 30 minutes or more, as able and the need to limit carbohydrate intake to 30 to 60 grams per meal to help with blood sugar control.   The need to take medication as prescribed, test blood sugar as directed, and to call between visits if there is a concern that blood sugar is uncontrolled is also discussed.   Joanna Brown is reminded of the importance of daily foot exam, annual eye examination, and good blood sugar, blood pressure and cholesterol control.     Latest Ref Rng & Units 05/26/2023    8:06 AM 03/23/2023   10:35 AM 03/16/2023    8:07 AM 03/09/2023   12:32 PM 01/25/2023    8:07 AM  Diabetic Labs  HbA1c 0.0 - 7.0 %  7.7      Chol 100 - 199 mg/dL 696   295   284   HDL >13 mg/dL 41   43   39   Calc LDL 0 - 99 mg/dL 89   82   244   Triglycerides 0 - 149 mg/dL 010   272   536   Creatinine 0.57 - 1.00 mg/dL 6.44   0.34  7.42  5.95       06/07/2023    9:53 AM 06/07/2023    9:09 AM 06/07/2023    9:08 AM 05/04/2023    1:30 PM  04/10/2023   12:41 AM 04/09/2023   10:12 PM 04/09/2023   10:10 PM  BP/Weight  Systolic BP 128 145 144 144 140  149  Diastolic BP 78 77 71 72 74  75  Wt. (Lbs)   172.12 175  178.57   BMI   28.64 kg/m2 29.12 kg/m2  29.72 kg/m2       Latest Ref Rng & Units 08/03/2021    9:20 AM 03/09/2021   12:00 AM  Foot/eye exam completion dates  Eye Exam No Retinopathy  No Retinopathy      Foot Form Completion  Done      This result is from an external source.      Uncontrolled , managed by Endo, reports erratic eating habits and self adjustment of insulin, does agree to go to diabetic ed and is referred  SOB (shortness of breath) on exertion 2 week history, pt at increased risk for CAD, EKG abn,non specific t wave abn and fasciciular block, Cardiology eval asap  Abnormal EKG Pt with new  symptom pof poor exercise tolerance and abnormalities on EKG which are new, cardiology eval asap  At increased risk for cardiovascular disease Multiplke co morbidities place pt at increased CAD risk, new symptom onset and abn EKG, cardiology eval asap  Mixed hyperlipidemia Hyperlipidemia:Low fat diet discussed and encouraged.   Lipid Panel  Lab Results  Component Value Date   CHOL 162 05/26/2023   HDL 41 05/26/2023   LDLCALC 89 05/26/2023   TRIG 184 (H) 05/26/2023   CHOLHDL 4.0 05/26/2023     Needs to reduce fat intake, TG are high

## 2023-06-29 ENCOUNTER — Other Ambulatory Visit: Payer: Self-pay | Admitting: "Endocrinology

## 2023-06-29 DIAGNOSIS — N181 Chronic kidney disease, stage 1: Secondary | ICD-10-CM

## 2023-07-04 ENCOUNTER — Telehealth (HOSPITAL_COMMUNITY): Payer: Self-pay | Admitting: *Deleted

## 2023-07-04 NOTE — Telephone Encounter (Signed)
 Reaching out to patient to offer assistance regarding upcoming cardiac imaging study; pt verbalizes understanding of appt date/time, parking situation and where to check in, pre-test NPO status and medications ordered, and verified current allergies; name and call back number provided for further questions should they arise Johney Frame RN Navigator Cardiac Imaging [facility] [phone] office [phone] cell

## 2023-07-05 ENCOUNTER — Ambulatory Visit (HOSPITAL_COMMUNITY): Admission: RE | Admit: 2023-07-05 | Payer: Medicare Other | Source: Ambulatory Visit

## 2023-07-13 ENCOUNTER — Other Ambulatory Visit: Payer: Self-pay | Admitting: Family Medicine

## 2023-07-18 ENCOUNTER — Encounter (HOSPITAL_COMMUNITY): Payer: Self-pay

## 2023-07-19 ENCOUNTER — Telehealth (HOSPITAL_COMMUNITY): Payer: Self-pay | Admitting: *Deleted

## 2023-07-19 ENCOUNTER — Encounter: Payer: Self-pay | Admitting: Nutrition

## 2023-07-19 ENCOUNTER — Encounter: Payer: Federal, State, Local not specified - PPO | Attending: Family Medicine | Admitting: Nutrition

## 2023-07-19 VITALS — Ht 65.0 in | Wt 170.0 lb

## 2023-07-19 DIAGNOSIS — E1122 Type 2 diabetes mellitus with diabetic chronic kidney disease: Secondary | ICD-10-CM | POA: Diagnosis not present

## 2023-07-19 DIAGNOSIS — Z794 Long term (current) use of insulin: Secondary | ICD-10-CM | POA: Diagnosis not present

## 2023-07-19 DIAGNOSIS — N181 Chronic kidney disease, stage 1: Secondary | ICD-10-CM | POA: Insufficient documentation

## 2023-07-19 DIAGNOSIS — E782 Mixed hyperlipidemia: Secondary | ICD-10-CM | POA: Insufficient documentation

## 2023-07-19 DIAGNOSIS — I10 Essential (primary) hypertension: Secondary | ICD-10-CM | POA: Diagnosis not present

## 2023-07-19 NOTE — Telephone Encounter (Signed)
 Reaching out to patient to offer assistance regarding upcoming cardiac imaging study; pt verbalizes understanding of appt date/time, parking situation and where to check in, pre-test NPO status and medications ordered, and verified current allergies; name and call back number provided for further questions should they arise  Joanna Brick RN Navigator Cardiac Imaging [facility] [phone] office [phone] cell  Patient to hold her carvedilol and take 50mg  metoprolol tartrate two hours prior to her cardiac CT scan. She is aware to arrive at 3 PM.

## 2023-07-19 NOTE — Progress Notes (Addendum)
 Medical Nutrition Therapy  Appointment Start time:  0800  Appointment End time:  0900  Primary concerns today:  Dm Type 2  Referral diagnosis: E11.8 Preferred learning style: no Preference  Learning readiness: Ready  NUTRITION ASSESSMENT  83 yr old bfemale referred for uncontrolled DM Type 2. She notes she takes her insulin after her meals and sometimes forgets. Libre shows 50% TIR. She is working on eating healthier foods now and eating meals on time. She realizes her blood sugars are probably higher due to taking her insulin incorrectly.Joanna Brown Recently had a stent put in and goes tomorrow for a cardiac test. Had been SOB and couldn't exercise. [facility]. Currently taking Humulin 70/30 insulin 40 in am and 50 units at night. Blood sugar was 251 this am and didn't get to take her insulin before coming to visit but had already eaten.  She notes since being on Farxiga she has developed a rash. Will talk to her PCP or Cardiologist about this. Pt notes her rash is in the vaginal area that started a few weeks ago. Advised her stop the farxiga and contact her PCP or Dr. [name] to let them know she stopped it and to request medication for the rash/yeast infection. She verbalized understanding.   CKD. Sees Dr. Wolfgang Phoenix. Hyperlipidemia- On Zetia and Pravastatin,  Clinical Medical Hx:  Past Medical History:  Diagnosis Date   Anginal pain (HCC)    Arthritis    OA   Diabetes mellitus    Dyspnea    Female bladder prolapse    GERD (gastroesophageal reflux disease)    Glaucoma    Hyperlipidemia    Hypertension    echo and stress 4/10 reports on chart, EKG ` LOV 9/12 on chart   S/P TAVR (transcatheter aortic valve replacement) 03/23/2021   Edwards 23mm S3U TF approach with Dr. Clifton James and Dr. Laneta Simmers   Severe aortic stenosis    Thyroid disease     Medications:  Current Outpatient Medications on File Prior to Visit  Medication Sig Dispense Refill    ALPHAGAN P 0.1 % SOLN PLACE 1 DROP INTO BOTH EYES EVERY MORNING     amLODipine (NORVASC) 10 MG tablet TAKE 1 TABLET(10 MG) BY MOUTH EVERY MORNING 90 tablet 3   benazepril (LOTENSIN) 40 MG tablet TAKE 1 TABLET(40 MG) BY MOUTH DAILY 90 tablet 1   carvedilol (COREG) 12.5 MG tablet TAKE 1 TABLET(12.5 MG) BY MOUTH TWICE DAILY WITH A MEAL 60 tablet 3   cholecalciferol (VITAMIN D3) 25 MCG (1000 UT) tablet Take 1,000 Units by mouth in the morning.     clopidogrel (PLAVIX) 75 MG tablet TAKE 1 TABLET(75 MG) BY MOUTH DAILY 90 tablet 3   dapagliflozin propanediol (FARXIGA) 5 MG TABS tablet Take 5 mg by mouth daily.     ezetimibe (ZETIA) 10 MG tablet Take 1 tablet (10 mg total) by mouth daily. 90 tablet 3   HUMULIN 70/30 KWIKPEN (70-30) 100 UNIT/ML KwikPen INJECT 50 UNITS WITH BREAKFAST AND 40 UNITS WITH SUPPER WHEN GLUCOSE READING ABOVE 90 30 mL 0   latanoprost (XALATAN) 0.005 % ophthalmic solution Place 1 drop into both eyes at bedtime.      metFORMIN (GLUCOPHAGE) 500 MG tablet TAKE 1 TABLET(500 MG) BY MOUTH DAILY WITH BREAKFAST 90 tablet 1   Multiple Vitamin (MULTIVITAMIN WITH MINERALS) TABS tablet Take 1 tablet by mouth daily.     Omega-3 Fatty Acids (FISH OIL) 1200 MG CAPS Take 1,200  mg by mouth every morning.     Blood Glucose Monitoring Suppl (ACCU-CHEK GUIDE) w/Device KIT 1 each by Does not apply route 4 (four) times daily. 1 kit 0   Continuous Glucose Receiver (FREESTYLE LIBRE 3 READER) DEVI Use to test BG 4 + times daily. E11.65 1 each 0   glucose blood (ACCU-CHEK GUIDE TEST) test strip USE TO CHECK BLOOD SUGAR IN THE MORNING, AT NOON, IN THE EVENING AND AT BEDTIME 150 strip 2   Insulin Pen Needle (B-D ULTRAFINE III SHORT PEN) 31G X 8 MM MISC USE WITH INSULIN TWICE DAILY AS DIRECTED 200 each 0   lubiprostone (AMITIZA) 24 MCG capsule Take 1 capsule (24 mcg total) by mouth 2 (two) times daily with a meal. (Patient not taking: Reported on 07/19/2023) 60 capsule 3   metoprolol tartrate (LOPRESSOR) 50 MG  tablet Take 1 tablet (50 mg total) by mouth once for 1 dose. TAKE 2 HOURS PRIOR TO PROCEDURE 1 tablet 0   pantoprazole (PROTONIX) 40 MG tablet Take 1 tablet (40 mg total) by mouth daily. 30 minutes breakfast (Patient not taking: Reported on 07/19/2023) 90 tablet 3   pravastatin (PRAVACHOL) 80 MG tablet Take 1 tablet (80 mg total) by mouth every evening. 90 tablet 3   No current facility-administered medications on file prior to visit.    Labs:  Lab Results  Component Value Date   HGBA1C 7.7 (A) 03/23/2023      Latest Ref Rng & Units 05/26/2023    8:06 AM 03/16/2023    8:07 AM 03/09/2023   12:32 PM  CMP  Glucose 70 - 99 mg/dL 161  096  045   BUN 8 - 27 mg/dL 16  23  25    Creatinine 0.57 - 1.00 mg/dL 4.09  8.11  9.14   Sodium 134 - 144 mmol/L 144  141  136   Potassium 3.5 - 5.2 mmol/L 3.7  3.7  3.8   Chloride 96 - 106 mmol/L 98  97  94   CO2 20 - 29 mmol/L 29  28  28    Calcium 8.7 - 10.3 mg/dL 78.2  9.8  9.6   Total Protein 6.0 - 8.5 g/dL 7.5  7.0    Total Bilirubin 0.0 - 1.2 mg/dL 0.4  0.3    Alkaline Phos 44 - 121 IU/L 53  54    AST 0 - 40 IU/L 18  22    ALT 0 - 32 IU/L 14  19     Lipid Panel     Component Value Date/Time   CHOL 162 05/26/2023 0806   TRIG 184 (H) 05/26/2023 0806   HDL 41 05/26/2023 0806   CHOLHDL 4.0 05/26/2023 0806   CHOLHDL 4.2 12/29/2021 0443   VLDL 49 (H) 12/29/2021 0443   LDLCALC 89 05/26/2023 0806   LDLCALC 99 07/30/2019 0718   LABVLDL 32 05/26/2023 0806    Notable Signs/Symptoms: high blood sugars  Lifestyle & Dietary Hx Married and lives at home. She does most cooking Working on eating at home more.  Estimated daily fluid intake: 80 oz Supplements:  Sleep:  6 hrs. Stress / self-care:  Current average weekly physical activity: Walks some  24-Hr Dietary Recall First Meal: Egg 1 1/2 bagel and coffee-  OR  egg and oatmeal Snack: blueberry muffin Second Meal: Salmon broiled, cucumber,  water Snack:  Third Meal: egg noodle and  vegetables-green beans and spinach,  water Snack:  Beverages: water  Estimated Energy Needs Calories: 1500 Carbohydrate: 170g Protein:  1112g Fat: 42g   NUTRITION DIAGNOSIS  NB-1.1 Food and nutrition-related knowledge deficit As related to Type 2 Dm .  As evidenced by A1C 7.7%..   NUTRITION INTERVENTION  Nutrition education (E-1) on the following topics:  Nutrition and Diabetes education provided on My Plate, CHO counting, meal planning, portion sizes, timing of meals, avoiding snacks between meals unless having a low blood sugar, target ranges for A1C and blood sugars, signs/symptoms and treatment of hyper/hypoglycemia, monitoring blood sugars, taking medications as prescribed, benefits of exercising 30 minutes per day and prevention of complications of DM.  Lifestyle Medicine  - Whole Food, Plant Predominant Nutrition is highly recommended: Eat Plenty of vegetables, Mushrooms, fruits, Legumes, Whole Grains, Nuts, seeds in lieu of processed meats, processed snacks/pastries red meat, poultry, eggs.    -It is better to avoid simple carbohydrates including: Cakes, Sweet Desserts, Ice Cream, Soda (diet and regular), Sweet Tea, Candies, Chips, Cookies, Store Bought Juices, Alcohol in Excess of  1-2 drinks a day, Lemonade,  Artificial Sweeteners, Doughnuts, Coffee Creamers, "Sugar-free" Products, etc, etc.  This is not a complete list.....  Exercise: If you are able: 30 -60 minutes a day ,4 days a week, or 150 minutes a week.  The longer the better.  Combine stretch, strength, and aerobic activities.  If you were told in the past that you have high risk for cardiovascular diseases, you may seek evaluation by your heart doctor prior to initiating moderate to intense exercise programs.  How to take insulin properly; before meals, not after.   Handouts Provided Include  Know your numbers LM Packet  Learning Style & Readiness for Change Teaching method utilized: Visual & Auditory   Demonstrated degree of understanding via: Teach Back  Barriers to learning/adherence to lifestyle change: none  Goals Established by Pt Goals Take insulin before breakfast and dinner instead of after Make sure you have 2 vegetables, a protein, a fruit and a starch at each meal. Goals to get to the senior center 3 times per week. Do the [facility]. Get A1C down to 7% or below   MONITORING & EVALUATION Dietary intake, weekly physical activity, and blood sugars in 2-3  month.  Next Steps  Patient is to work on taking insulin before meals and not after. Don't skip insulin doses.Joanna Brown

## 2023-07-19 NOTE — Patient Instructions (Signed)
 Goals Take insulin before breakfast and dinner instead of after Make sure you have 2 vegetables, a protein, a fruit and a starch at each meal. Goals to get to the senior center 3 times per week. Do the [facility]. Get A1C down to 7% or below

## 2023-07-20 ENCOUNTER — Ambulatory Visit (HOSPITAL_COMMUNITY)
Admission: RE | Admit: 2023-07-20 | Discharge: 2023-07-20 | Disposition: A | Discharge status: Home / Self Care | Payer: Federal, State, Local not specified - PPO | Source: Ambulatory Visit | Attending: Emergency Medicine | Admitting: Emergency Medicine

## 2023-07-20 ENCOUNTER — Ambulatory Visit (HOSPITAL_COMMUNITY)
Admission: RE | Admit: 2023-07-20 | Discharge: 2023-07-20 | Disposition: A | Discharge status: Home / Self Care | Payer: Self-pay | Source: Ambulatory Visit | Attending: Cardiovascular Disease | Admitting: Cardiovascular Disease

## 2023-07-20 ENCOUNTER — Other Ambulatory Visit: Payer: Self-pay | Admitting: Cardiovascular Disease

## 2023-07-20 DIAGNOSIS — R931 Abnormal findings on diagnostic imaging of heart and coronary circulation: Secondary | ICD-10-CM

## 2023-07-20 DIAGNOSIS — I2510 Atherosclerotic heart disease of native coronary artery without angina pectoris: Secondary | ICD-10-CM

## 2023-07-20 DIAGNOSIS — R072 Precordial pain: Secondary | ICD-10-CM | POA: Insufficient documentation

## 2023-07-20 LAB — POCT I-STAT CREATININE: Creatinine, Ser: 0.9 mg/dL (ref 0.44–1.00)

## 2023-07-20 MED ORDER — DILTIAZEM HCL 25 MG/5ML IV SOLN: 10.0000 mg | INTRAVENOUS | Status: DC | PRN

## 2023-07-20 MED ORDER — METOPROLOL TARTRATE 5 MG/5ML IV SOLN: 10.0000 mg | Freq: Once | INTRAVENOUS | Status: DC | PRN

## 2023-07-20 MED ORDER — NITROGLYCERIN 0.4 MG SL SUBL
SUBLINGUAL_TABLET | SUBLINGUAL | Status: AC
Start: 2023-07-20 — End: ?
  Filled 2023-07-20: qty 2

## 2023-07-20 MED ORDER — IOHEXOL 350 MG/ML SOLN
95.0000 mL | Freq: Once | INTRAVENOUS | Status: AC | PRN
  Administered 2023-07-20: 95 mL via INTRAVENOUS

## 2023-07-20 MED ORDER — NITROGLYCERIN 0.4 MG SL SUBL
0.8000 mg | SUBLINGUAL_TABLET | Freq: Once | SUBLINGUAL | Status: AC
  Administered 2023-07-20: 0.8 mg via SUBLINGUAL

## 2023-07-20 NOTE — Progress Notes (Signed)
 CT FFR ordered.  Joanna Brown T. Flora Lipps, MD, [facility]  [facility]  [address], [address] [place], [place] [address] [phone]  10:49 PM

## 2023-07-21 ENCOUNTER — Encounter: Payer: Self-pay | Admitting: "Endocrinology

## 2023-07-21 ENCOUNTER — Ambulatory Visit (INDEPENDENT_AMBULATORY_CARE_PROVIDER_SITE_OTHER): Payer: Medicare Other | Admitting: "Endocrinology

## 2023-07-21 VITALS — BP 126/68 | HR 68 | Ht 65.0 in | Wt 171.2 lb

## 2023-07-21 DIAGNOSIS — I10 Essential (primary) hypertension: Secondary | ICD-10-CM

## 2023-07-21 DIAGNOSIS — N181 Chronic kidney disease, stage 1: Secondary | ICD-10-CM

## 2023-07-21 DIAGNOSIS — E782 Mixed hyperlipidemia: Secondary | ICD-10-CM | POA: Diagnosis not present

## 2023-07-21 DIAGNOSIS — E1122 Type 2 diabetes mellitus with diabetic chronic kidney disease: Secondary | ICD-10-CM | POA: Diagnosis not present

## 2023-07-21 DIAGNOSIS — Z794 Long term (current) use of insulin: Secondary | ICD-10-CM | POA: Diagnosis not present

## 2023-07-21 LAB — POCT GLYCOSYLATED HEMOGLOBIN (HGB A1C): HbA1c, POC (controlled diabetic range): 8.4 % — AB (ref 0.0–7.0)

## 2023-07-21 MED ORDER — HUMULIN 70/30 KWIKPEN (70-30) 100 UNIT/ML ~~LOC~~ SUPN: PEN_INJECTOR | SUBCUTANEOUS | 0 refills | Status: DC

## 2023-07-21 NOTE — Progress Notes (Addendum)
 Needed 07/21/2023                     Endocrinology follow-up note   Subjective:    Patient ID: Joanna Brown, female    DOB: 03-Oct-1940. She is being seen in follow-up for uncontrolled type 2 diabetes, complicated by CKD, hyperlipidemia, hypertension.     PMD:   Antonetta Rollene BRAVO, MD  Past Medical History:  Diagnosis Date   Anginal pain (HCC)    Arthritis    OA   Diabetes mellitus    Dyspnea    Female bladder prolapse    GERD (gastroesophageal reflux disease)    Glaucoma    Hyperlipidemia    Hypertension    echo and stress 4/10 reports on chart, EKG ` LOV 9/12 on chart   S/P TAVR (transcatheter aortic valve replacement) 03/23/2021   Edwards 23mm S3U TF approach with Dr. Verlin and Dr. Lucas   Severe aortic stenosis    Thyroid  disease    Past Surgical History:  Procedure Laterality Date   ABDOMINAL HYSTERECTOMY     ANTERIOR AND POSTERIOR REPAIR  04/26/2011   Procedure: ANTERIOR (CYSTOCELE) AND POSTERIOR REPAIR (RECTOCELE);  Surgeon: Glendia DELENA Elizabeth, MD;  Location: [facility];  Service: Urology;  Laterality: N/A;   BIOPSY  05/25/2020   Procedure: BIOPSY;  Surgeon: Legrand Victory LITTIE DOUGLAS, MD;  Location: [facility];  Service: Gastroenterology;;   CATARACT EXTRACTION Bilateral    with IOL   CHOLECYSTECTOMY  2009   COLONOSCOPY N/A 12/02/2013   three colon polyps removed, small internal hemorrhoids. Hyperplastic polyps   COLONOSCOPY N/A 11/20/2019   pancolonic diverticulosis, two 10-11 mm polyps in ascending colon, one 5 mm polyp in cecum, ascending colon with superficially invasive adenocarcinoma arising in background of sessile serrated polyps with low and high grade cytologic dysplasia.   COLONOSCOPY     COLONOSCOPY W/ POLYPECTOMY     COLONOSCOPY WITH PROPOFOL  N/A 05/25/2020   diverticulosis in right colon, redundant colon. Caution warranted on future colonoscopy in light of age, cardiac condition, challenging anatomy.    DILATION AND CURETTAGE OF UTERUS     pt states  this was in the 1970's   ESOPHAGOGASTRODUODENOSCOPY (EGD) WITH PROPOFOL  N/A 05/25/2020   Grade 1 esophageal varices, single mucosal nodule in stomach s/p biopsy. (hyperplastic)>    LEFT HEART CATH  09/10/2008   normal coronary arteries, normal LV systolic function, EF 65% (Dr. HILARIO Jester)   LYMPH NODE DISSECTION Right 1997   under arm   NM MYOCAR PERF WALL MOTION  2010   dipyridamole - mild-mod in intenstiy perfusion defect in mid anterior, mid anteroseptal wall, EF 70%   OVARY SURGERY     bilateral tumors removed   POLYPECTOMY  11/20/2019   Procedure: POLYPECTOMY;  Surgeon: Shaaron Lamar HERO, MD;  Location: [facility];  Service: Endoscopy;;  hot and cold snare cecal polyp, and asending polyps x 2   RIGHT/LEFT HEART CATH AND CORONARY ANGIOGRAPHY N/A 10/02/2020   Procedure: RIGHT/LEFT HEART CATH AND CORONARY ANGIOGRAPHY;  Surgeon: [place], Peter M, MD;  Location: [facility] INVASIVE CV LAB;  Service: Cardiovascular;  Laterality: N/A;   THYROIDECTOMY     THYROIDECTOMY  02/2008   TRANSCATHETER AORTIC VALVE REPLACEMENT, TRANSFEMORAL N/A 03/23/2021   Procedure: TRANSCATHETER AORTIC VALVE REPLACEMENT, TRANSFEMORAL;  Surgeon: Verlin Lonni BIRCH, MD;  Location: [facility] INVASIVE CV LAB;  Service: Open Heart Surgery;  Laterality: N/A;   TRANSTHORACIC ECHOCARDIOGRAM  08/2011   EF=>55%, mild conc LVH; trace MR;  mild TR; mild-mod AV calcification with mild valvular AV stenosis   VAGINAL PROLAPSE REPAIR  04/26/2011   Procedure: VAGINAL VAULT SUSPENSION;  Surgeon: Glendia DELENA Elizabeth, MD;  Location: [facility];  Service: Urology;  Laterality: N/A;  with Graft  10x6   Social History   Socioeconomic History   Marital status: Married    Spouse name: [name]   Number of children: 1   Years of education: Trade   Highest education level: 12th grade  Occupational History   Occupation: Retired   Occupation: Insurance account manager  Tobacco Use   Smoking status: Never   Smokeless tobacco: Never  Vaping Use   Vaping  status: Never Used  Substance and Sexual Activity   Alcohol use: Not Currently   Drug use: No   Sexual activity: Yes  Other Topics Concern   Not on file  Social History Narrative   Patient lives at home with spouse. 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 LIVED IN [place] FOR 50 YRS AND CAME BACK TO [place] ~2009. Caffeine Use: 1 cup of coffee daily. HAD ONE CHILD: PASSED 5 YRS. HAVE THREE GRAND-KIDS AND TWO GREAT GRANDS.    Social Drivers of Health   Financial Resource Strain: Medium Risk (02/05/2023)   Overall Financial Resource Strain (CARDIA)    Difficulty of Paying Living Expenses: Somewhat hard  Food Insecurity: Food Insecurity Present (02/05/2023)   Hunger Vital Sign    Worried About Running Out of Food in the Last Year: Sometimes true    Ran Out of Food in the Last Year: Never true  Transportation Needs: Unmet Transportation Needs (02/05/2023)   PRAPARE - Transportation    Lack of Transportation (Medical): Yes    Lack of Transportation (Non-Medical): Yes  Physical Activity: Insufficiently Active (02/05/2023)   Exercise Vital Sign    Days of Exercise per Week: 4 days    Minutes of Exercise per Session: 30 min  Stress: Stress Concern Present (02/05/2023)   [employer] of Occupational Health - Occupational Stress Questionnaire    Feeling of Stress : To some extent  Social Connections: Socially Integrated (02/05/2023)   Social Connection and Isolation Panel [NHANES]    Frequency of Communication with Friends and Family: More than three times a week    Frequency of Social Gatherings with Friends and Family: Once a week    Attends Religious Services: More than 4 times per year    Active Member of [employer] or Organizations: Yes    Attends Banker Meetings: More than 4 times per year    Marital Status: Married   Outpatient Encounter Medications as of 07/21/2023  Medication Sig   ALPHAGAN  P 0.1 % SOLN PLACE 1 DROP INTO BOTH EYES EVERY MORNING    amLODipine  (NORVASC ) 10 MG tablet TAKE 1 TABLET(10 MG) BY MOUTH EVERY MORNING   benazepril  (LOTENSIN ) 40 MG tablet TAKE 1 TABLET(40 MG) BY MOUTH DAILY   Blood Glucose Monitoring Suppl (ACCU-CHEK GUIDE) w/Device KIT 1 each by Does not apply route 4 (four) times daily.   carvedilol  (COREG ) 12.5 MG tablet TAKE 1 TABLET(12.5 MG) BY MOUTH TWICE DAILY WITH A MEAL   cholecalciferol (VITAMIN D3) 25 MCG (1000 UT) tablet Take 1,000 Units by mouth in the morning.   clopidogrel  (PLAVIX ) 75 MG tablet TAKE 1 TABLET(75 MG) BY MOUTH DAILY   Continuous Glucose Receiver (FREESTYLE LIBRE 3 READER) DEVI Use to test BG 4 + times daily. E11.65   ezetimibe  (ZETIA ) 10 MG tablet Take 1  tablet (10 mg total) by mouth daily.   glucose blood (ACCU-CHEK GUIDE TEST) test strip USE TO CHECK BLOOD SUGAR IN THE MORNING, AT NOON, IN THE EVENING AND AT BEDTIME   insulin  isophane & regular human KwikPen (HUMULIN  70/30 KWIKPEN) (70-30) 100 UNIT/ML KwikPen INJECT 50 UNITS WITH BREAKFAST AND 40 UNITS WITH SUPPER WHEN GLUCOSE READING ABOVE 90   Insulin  Pen Needle (B-D ULTRAFINE III SHORT PEN) 31G X 8 MM MISC USE WITH INSULIN  TWICE DAILY AS DIRECTED   latanoprost  (XALATAN ) 0.005 % ophthalmic solution Place 1 drop into both eyes at bedtime.    lubiprostone  (AMITIZA ) 24 MCG capsule Take 1 capsule (24 mcg total) by mouth 2 (two) times daily with a meal. (Patient not taking: Reported on 07/19/2023)   metFORMIN  (GLUCOPHAGE ) 500 MG tablet TAKE 1 TABLET(500 MG) BY MOUTH DAILY WITH BREAKFAST   metoprolol  tartrate (LOPRESSOR ) 50 MG tablet Take 1 tablet (50 mg total) by mouth once for 1 dose. TAKE 2 HOURS PRIOR TO PROCEDURE   Multiple Vitamin (MULTIVITAMIN WITH MINERALS) TABS tablet Take 1 tablet by mouth daily.   Omega-3 Fatty Acids (FISH OIL) 1200 MG CAPS Take 1,200 mg by mouth every morning.   pantoprazole  (PROTONIX ) 40 MG tablet Take 1 tablet (40 mg total) by mouth daily. 30 minutes breakfast (Patient not taking: Reported on 07/19/2023)    pravastatin  (PRAVACHOL ) 80 MG tablet Take 1 tablet (80 mg total) by mouth every evening.   [DISCONTINUED] dapagliflozin propanediol (FARXIGA) 5 MG TABS tablet Take 5 mg by mouth daily. (Patient not taking: Reported on 07/21/2023)   [DISCONTINUED] HUMULIN  70/30 KWIKPEN (70-30) 100 UNIT/ML KwikPen INJECT 50 UNITS WITH BREAKFAST AND 40 UNITS WITH SUPPER WHEN GLUCOSE READING ABOVE 90 (Patient taking differently: INJECT 40 UNITS WITH BREAKFAST AND 50 UNITS WITH SUPPER WHEN GLUCOSE READING ABOVE 90)   No facility-administered encounter medications on file as of 07/21/2023.   ALLERGIES: Allergies  Allergen Reactions   Inspra [Eplerenone] Itching   Senokot Wheat Bran [Wheat]     ABDOMINAL CRAMPS   Spironolactone      Stomach problems, vision changes    VACCINATION STATUS: Immunization History  Administered Date(s) Administered   Hepatitis B, ADULT 09/27/2021   MODERNA COVID-19 SARS-COV-2 PEDS BIVALENT BOOSTER 65yr-58yr 01/09/2023   Moderna SARS-COV2 Booster Vaccination 11/19/2020   Moderna Sars-Covid-2 Vaccination 09/13/2019, 10/12/2019, 04/29/2020   Pneumococcal Conjugate-13 12/11/2013   Pneumococcal Polysaccharide-23 01/13/2010   Tdap 10/05/2010, 08/23/2022    Diabetes She presents for her follow-up diabetic visit. She has type 2 diabetes mellitus. Onset time: She was diagnosed at approximate age of 41 years. Her disease course has been worsening. There are no hypoglycemic associated symptoms. Pertinent negatives for hypoglycemia include no confusion, headaches, pallor or seizures. Pertinent negatives for diabetes include no blurred vision, no chest pain, no fatigue, no polydipsia, no polyphagia and no polyuria. There are no hypoglycemic complications. Symptoms are worsening. Diabetic complications include nephropathy and retinopathy. Risk factors for coronary artery disease include dyslipidemia, diabetes mellitus, obesity and sedentary lifestyle. Her weight is increasing steadily. She is following a  generally unhealthy diet. When asked about meal planning, she reported none. She has had a previous visit with a dietitian. She rarely participates in exercise. Her home blood glucose trend is increasing steadily. Her breakfast blood glucose range is generally 140-180 mg/dl. Her lunch blood glucose range is generally 140-180 mg/dl. Her dinner blood glucose range is generally 180-200 mg/dl. Her bedtime blood glucose range is generally 180-200 mg/dl. Her overall blood glucose range is 180-200 mg/dl. (  Joanna Brown presents with worsening glycemic profile.  Her AGP report shows 49% time range, 37% Level One hyperglycemia, 14% level 2 hyperglycemia.  She did not have hypoglycemia.  Her point-of-care A1c is 8.4% increasing from 7.7%.  Her average blood glucose is 189 mg per DL. ) Eye exam is current.  Hyperlipidemia This is a chronic problem. The current episode started more than 1 year ago. The problem is uncontrolled. Exacerbating diseases include diabetes and obesity. Pertinent negatives include no chest pain, myalgias or shortness of breath. Current antihyperlipidemic treatment includes statins. Risk factors for coronary artery disease include dyslipidemia, diabetes mellitus, hypertension, obesity, a sedentary lifestyle and post-menopausal.  Hypertension This is a chronic problem. The current episode started more than 1 year ago. Pertinent negatives include no blurred vision, chest pain, headaches, palpitations or shortness of breath. Risk factors for coronary artery disease include dyslipidemia, diabetes mellitus, obesity and sedentary lifestyle. Hypertensive end-organ damage includes retinopathy.   Nodular goiter She Patient is being seen today for a new issue with her thyroid .  She was found to have 2.6 cm nodule on right lobe of her thyroid  while undergoing CT scan of the chest during cancer surveillance.  She has previous history of left hemithyroidectomy approximately 10 years ago for multinodular goiter.   She is not on thyroid  hormone supplement or replacement.  She denies dysphagia, shortness of breath, nor voice change. -Her subsequent thyroid  ultrasound confirms multinodular right lobe of the thyroid  with surgically absent left lobe.  Her right thyroid  lobe nodule did not meet criteria for biopsy.  Review of systems  Constitutional: + Minimally fluctuating body weight, current  Body mass index is 28.49 kg/m. , no fatigue, no subjective hyperthermia, no subjective hypothermia    Objective:    BP 126/68   Pulse 68   Ht 5' 5 (1.651 m)   Wt 171 lb 3.2 oz (77.7 kg)   BMI 28.49 kg/m   Wt Readings from Last 3 Encounters:  07/21/23 171 lb 3.2 oz (77.7 kg)  07/19/23 170 lb (77.1 kg)  06/19/23 173 lb 9.6 oz (78.7 kg)    Physical Exam- Limited  Constitutional:  Body mass index is 28.49 kg/m. , not in acute distress, normal state of mind    CMP ( most recent) CMP     Component Value Date/Time   NA 144 05/26/2023 0806   K 3.7 05/26/2023 0806   CL 98 05/26/2023 0806   CO2 29 05/26/2023 0806   GLUCOSE 192 (H) 05/26/2023 0806   GLUCOSE 223 (H) 12/28/2021 1624   BUN 16 05/26/2023 0806   CREATININE 0.90 07/20/2023 1536   CREATININE 1.11 (H) 03/02/2020 0848   CALCIUM  10.0 05/26/2023 0806   PROT 7.5 05/26/2023 0806   ALBUMIN 4.5 05/26/2023 0806   AST 18 05/26/2023 0806   ALT 14 05/26/2023 0806   ALKPHOS 53 05/26/2023 0806   BILITOT 0.4 05/26/2023 0806   GFRNONAA 43 (L) 12/28/2021 1624   GFRNONAA 57 (L) 07/30/2019 0718   GFRAA 66 07/30/2019 0718    Diabetic Labs (most recent): Lab Results  Component Value Date   HGBA1C 8.4 (A) 07/21/2023   HGBA1C 7.7 (A) 03/23/2023   HGBA1C 6.8 11/10/2022   MICROALBUR 6.4 07/30/2019   MICROALBUR 11.5 06/28/2018   MICROALBUR 10.3 (H) 07/18/2017    Lipid Panel     Component Value Date/Time   CHOL 162 05/26/2023 0806   TRIG 184 (H) 05/26/2023 0806   HDL 41 05/26/2023 0806   CHOLHDL 4.0 05/26/2023 0806  CHOLHDL 4.2 12/29/2021  0443   VLDL 49 (H) 12/29/2021 0443   LDLCALC 89 05/26/2023 0806   LDLCALC 99 07/30/2019 0718   Incidental finding on chest CT on December 05, 2019 Enlarged  multinodular remnant right thyroid  with dominant 2.6 cm hypodense Nodule.   Thyroid  ultrasound on December 19, 2019: Right lobe 4.4 cm, left lobe absent surgically. No adenopathy   IMPRESSION: Surgical changes of left hemithyroidectomy.  Multinodular thyroid .  3.9 cm and 1.6 cm cystic/almost completely cystic nodules with smooth margins, No thyroid  nodule meets criteria for biopsy or surveillance, as designated by the newly established ACR TI-RADS criteria.   Assessment & Plan:    1. Type 2 diabetes mellitus with stage 1 chronic kidney disease, with long-term current use of insulin  (HCC).  Joanna Brown presents with worsening glycemic profile.  Her AGP report shows 49% time range, 37% Level One hyperglycemia, 14% level 2 hyperglycemia.  She did not have hypoglycemia.  Her point-of-care A1c is 8.4% increasing from 7.7%.  Her average blood glucose is 189 mg per DL.    Recent labs reviewed, showing improving renal function.     Her diabetes is complicated by CKD obesity/sedentary life and patient remains at a high risk for more acute and chronic complications of diabetes which include CAD, CVA, CKD, retinopathy, and neuropathy. These are all discussed in detail with the patient.  - I have counseled the patient on diet management and weight loss, by adopting a carbohydrate restricted/protein rich diet.  -She still admits to dietary indiscretions including consumption of sweets and sweetened beverages.  - she acknowledges that there is a room for improvement in her food and drink choices. - Suggestion is made for her to avoid simple carbohydrates  from her diet including Cakes, Sweet Desserts, Ice Cream, Soda (diet and regular), Sweet Tea, Candies, Chips, Cookies, Store Bought Juices, Alcohol , Artificial Sweeteners,  Coffee Creamer, and  Sugar-free Products, Lemonade. This will help patient to have more stable blood glucose profile and potentially avoid unintended weight gain.  The following Lifestyle Medicine recommendations according to American College of Lifestyle Medicine  [facility]) were discussed and and offered to patient and she  agrees to start the journey:  A. Whole Foods, Plant-Based Nutrition comprising of fruits and vegetables, plant-based proteins, whole-grain carbohydrates was discussed in detail with the patient.   A list for source of those nutrients were also provided to the patient.  Patient will use only water  or unsweetened tea for hydration. B.  The need to stay away from risky substances including alcohol, smoking; obtaining 7 to 9 hours of restorative sleep, at least 150 minutes of moderate intensity exercise weekly, the importance of healthy social connections,  and stress management techniques were discussed. C.  A full color page of  Calorie density of various food groups per pound showing examples of each food groups was provided to the patient.    - I encouraged the patient to switch to  unprocessed or minimally processed complex starch and increased protein intake (animal or plant source), fruits, and vegetables.  - Patient is advised to stick to a routine mealtimes to eat 3 meals  a day and avoid unnecessary snacks ( to snack only to correct hypoglycemia).   - I have approached patient with the following individualized plan to manage diabetes and patient agrees:     -Since she presents with significantly above target postprandial glycemic profile, she is advised to increase her Humulin  70/30 to 50 units at breakfast  and 40 units with supper   when Premeal blood glucose readings are above 90 mg per DL.   -She is encouraged to start monitoring blood glucose 4 times a day-before meals and at bedtime . -She is benefiting from her CGM, advised to utilize continuously.  -Patient is encouraged to call  clinic for blood glucose levels less than 70 or above 200 mg /dl. -She did not tolerate Farxiga which caused frequent yeast infections.  She discontinued this medication by her own.   She will continue metformin  500 mg p.o. daily at breakfast.    - Patient specific target  A1c;  LDL, HDL, Triglycerides, were discussed in detail.  2.  Nodular goiter: Patient with remote past history of left hemithyroidectomy for multinodular goiter.  She did not require thyroid  hormone supplement.  She was incidentally found to have 2.6 cm nodule in the right lobe. -Her subsequent dedicated thyroid  ultrasound confirmed 2 nodules on the right lobe of her thyroid  which did not meet any criteria for biopsy. -She will not need any antithyroid intervention at this time.  Her recent thyroid  function test were consistent with euthyroid presentation.  She will be considered for thyroid  function test along with her subsequent labs.   3) BP/HTN:  -Her blood pressure is controlled to target.   She is advised to continue her current blood pressure medications including benazepril  40 mg p.o. daily at breakfast.    4) Lipids/HPL: Her recent lipid panel showed near target LDL at 89.  She is advised to continue pravastatin  80 mg nightly.    Side effects and precautions discussed with her.    Whole food plant-based diet was discussed with her.       5)  Weight/Diet: Her BMI is 28.49---she is a candidate for moderate weight loss.  CDE Consult has been initiated , exercise, and detailed carbohydrates information provided.  6) Chronic Care/Health Maintenance:  -Patient is on ACEI/ARB and Statin medications and encouraged to continue to follow up with Ophthalmology, Podiatrist at least yearly or according to recommendations, and advised to  stay away from smoking. I have recommended yearly flu vaccine and pneumonia vaccination at least every 5 years; moderate intensity exercise for up to 150 minutes weekly; and  sleep for at least  7 hours a day.   POC ABI for PAD screen was normal on June 10, 2020.  This test will be repeated in January 2027, or sooner if needed.     - I advised patient to maintain close follow up with Antonetta Rollene BRAVO, MD for primary care needs.  I spent  26  minutes in the care of the patient today including review of labs from CMP, Lipids, Thyroid  Function, Hematology (current and previous including abstractions from other facilities); face-to-face time discussing  her blood glucose readings/logs, discussing hypoglycemia and hyperglycemia episodes and symptoms, medications doses, her options of short and long term treatment based on the latest standards of care / guidelines;  discussion about incorporating lifestyle medicine;  and documenting the encounter. Risk reduction counseling performed per USPSTF guidelines to reduce  cardiovascular risk factors.     Please refer to Patient Instructions for Blood Glucose Monitoring and Insulin /Medications Dosing Guide  in media tab for additional information. Please  also refer to  Patient Self Inventory in the Media  tab for reviewed elements of pertinent patient history.  Joanna Brown participated in the discussions, expressed understanding, and voiced agreement with the above plans.  All questions were answered to her  satisfaction. she is encouraged to contact clinic should she have any questions or concerns prior to her return visit.   Follow up plan: - Return in about 4 months (around 11/20/2023) for Bring Meter/CGM Device/Logs- A1c in Office.  Ranny Earl, MD Phone: [phone]  Fax: [phone]   This note was partially dictated with voice recognition software. Similar sounding words can be transcribed inadequately or may not  be corrected upon review.  07/21/2023, 12:51 PM

## 2023-07-21 NOTE — Patient Instructions (Signed)

## 2023-07-24 ENCOUNTER — Telehealth: Payer: Self-pay | Admitting: *Deleted

## 2023-07-24 NOTE — Telephone Encounter (Signed)
 Spoke with patient letting her know the results to the recent coronary CTA test that she had. "Coronary CTA showed mild coronary artery disease (25-49%) of all 3 coronary arteries.  The test was sent for [facility] which showed your coronary arteries were nonobstructive with no significant stenosis and had a normal flow of blood to your heart.  Overall these are reassuring results and your chest pain was likely not coming from your heart.  Continue with heart healthy dieting.  Continue current medication management and follow-up as planned." Per [facility]. Patient denied having any questions.

## 2023-07-31 DIAGNOSIS — H401131 Primary open-angle glaucoma, bilateral, mild stage: Secondary | ICD-10-CM | POA: Diagnosis not present

## 2023-08-21 ENCOUNTER — Other Ambulatory Visit: Payer: Self-pay | Admitting: "Endocrinology

## 2023-08-21 DIAGNOSIS — E1122 Type 2 diabetes mellitus with diabetic chronic kidney disease: Secondary | ICD-10-CM

## 2023-08-22 ENCOUNTER — Ambulatory Visit (INDEPENDENT_AMBULATORY_CARE_PROVIDER_SITE_OTHER): Payer: Federal, State, Local not specified - PPO

## 2023-08-22 VITALS — BP 127/77 | Ht 65.0 in | Wt 170.0 lb

## 2023-08-22 DIAGNOSIS — Z0000 Encounter for general adult medical examination without abnormal findings: Secondary | ICD-10-CM | POA: Diagnosis not present

## 2023-08-22 DIAGNOSIS — E119 Type 2 diabetes mellitus without complications: Secondary | ICD-10-CM

## 2023-08-22 DIAGNOSIS — Z780 Asymptomatic menopausal state: Secondary | ICD-10-CM

## 2023-08-22 DIAGNOSIS — E1169 Type 2 diabetes mellitus with other specified complication: Secondary | ICD-10-CM

## 2023-08-22 DIAGNOSIS — Z794 Long term (current) use of insulin: Secondary | ICD-10-CM | POA: Diagnosis not present

## 2023-08-22 DIAGNOSIS — Z7984 Long term (current) use of oral hypoglycemic drugs: Secondary | ICD-10-CM

## 2023-08-22 NOTE — Patient Instructions (Signed)
 Joanna Brown , Thank you for taking time to come for your Medicare Wellness Visit. I appreciate your ongoing commitment to your health goals. Please review the following plan we discussed and let me know if I can assist you in the future.   Referrals/Orders/Follow-Ups/Clinician Recommendations:  Your osteoporosis screening has been scheduled for: August 29, 2023 at 10:30 am at [facility] -Discontinue any medications that contain calcium at least 48 hours (2 days) prior to your scan. -Bring list of medications you are currently taking including any supplements.  -Make sure you wear comfortable 2 piece clothing    Next Medicare Annual Wellness Visit: August 26, 2024 at 10:00 am telephone visit.   This is a list of the screening recommended for you and due dates:  Health Maintenance  Topic Date Due   DEXA scan (bone density measurement)  01/15/2016   Eye exam for diabetics  03/09/2022   COVID-19 Vaccine (5 - 2024-25 season) 03/06/2023   Complete foot exam   08/03/2023   Yearly kidney health urinalysis for diabetes  09/13/2023   Zoster (Shingles) Vaccine (1 of 2) 05/15/2024*   Flu Shot  12/15/2023   Hemoglobin A1C  01/21/2024   Mammogram  02/01/2024   Yearly kidney function blood test for diabetes  05/25/2024   Medicare Annual Wellness Visit  08/21/2024   DTaP/Tdap/Td vaccine (3 - Td or Tdap) 08/22/2032   Pneumonia Vaccine  Completed   HPV Vaccine  Aged Out   Hepatitis C Screening  Discontinued  *Topic was postponed. The date shown is not the original due date.    Advanced directives: (Provided) Advance directive discussed with you today. I have provided a copy for you to complete at home and have notarized. Once this is complete, please bring a copy in to our office so we can scan it into your chart.   Next Medicare Annual Wellness Visit scheduled for next year: yes  Understanding Your Risk for Falls Millions of people have serious injuries from falls each year. It is important to  understand your risk of falling. Talk with your health care provider about your risk and what you can do to lower it. If you do have a serious fall, make sure to tell your provider. Falling once raises your risk of falling again. How can falls affect me? Serious injuries from falls are common. These include: Broken bones, such as hip fractures. Head injuries, such as traumatic brain injuries (TBI) or concussions. A fear of falling can cause you to avoid activities and stay at home. This can make your muscles weaker and raise your risk for a fall. What can increase my risk? There are a number of risk factors that increase your risk for falling. The more risk factors you have, the higher your risk of falling. Serious injuries from a fall happen most often to people who are older than 82 years old. Teenagers and young adults ages 61-29 are also at higher risk. Common risk factors include: Weakness in the lower body. Being generally weak or confused due to long-term (chronic) illness. Dizziness or balance problems. Poor vision. Medicines that cause dizziness or drowsiness. These may include: Medicines for your blood pressure, heart, anxiety, insomnia, or swelling (edema). Pain medicines. Muscle relaxants. Other risk factors include: Drinking alcohol. Having had a fall in the past. Having foot pain or wearing improper footwear. Working at a dangerous job. Having any of the following in your home: Tripping hazards, such as floor clutter or loose rugs. Poor lighting. Pets.  Having dementia or memory loss. What actions can I take to lower my risk of falling?     Physical activity Stay physically fit. Do strength and balance exercises. Consider taking a regular class to build strength and balance. Yoga and tai chi are good options. Vision Have your eyes checked every year and your prescription for glasses or contacts updated as needed. Shoes and walking aids Wear non-skid shoes. Wear  shoes that have rubber soles and low heels. Do not wear high heels. Do not walk around the house in socks or slippers. Use a cane or walker as told by your provider. Home safety Attach secure railings on both sides of your stairs. Install grab bars for your bathtub, shower, and toilet. Use a non-skid mat in your bathtub or shower. Attach bath mats securely with double-sided, non-slip rug tape. Use good lighting in all rooms. Keep a flashlight near your bed. Make sure there is a clear path from your bed to the bathroom. Use night-lights. Do not use throw rugs. Make sure all carpeting is taped or tacked down securely. Remove all clutter from walkways and stairways, including extension cords. Repair uneven or broken steps and floors. Avoid walking on icy or slippery surfaces. Walk on the grass instead of on icy or slick sidewalks. Use ice melter to get rid of ice on walkways in the winter. Use a cordless phone. Questions to ask your health care provider Can you help me check my risk for a fall? Do any of my medicines make me more likely to fall? Should I take a vitamin D supplement? What exercises can I do to improve my strength and balance? Should I make an appointment to have my vision checked? Do I need a bone density test to check for weak bones (osteoporosis)? Would it help to use a cane or a walker? Where to find more information Centers for Disease Control and Prevention, STEADI: TonerPromos.no Community-Based Fall Prevention Programs: TonerPromos.no [employer] on Aging: BaseRingTones.pl Contact a health care provider if: You fall at home. You are afraid of falling at home. You feel weak, drowsy, or dizzy. This information is not intended to replace advice given to you by your health care provider. Make sure you discuss any questions you have with your health care provider. Document Revised: 01/03/2022 Document Reviewed: 01/03/2022 Elsevier Patient Education  2024 [employer].

## 2023-08-22 NOTE — Progress Notes (Signed)
 Because this visit was a virtual/telehealth visit,  certain criteria was not obtained, such a blood pressure, CBG if applicable, and timed get up and go. Any medications not marked as "taking" were not mentioned during the medication reconciliation part of the visit. Any vitals not documented were not able to be obtained due to this being a telehealth visit or patient was unable to self-report a recent blood pressure reading due to a lack of equipment at home via telehealth. Vitals that have been documented are verbally provided by the patient.   Subjective:   Joanna Brown is a 83 y.o. who presents for a Medicare Wellness preventive visit.  Visit Complete: Virtual I connected with  Nikyla H Griess on 08/22/23 by a audio enabled telemedicine application and verified that I am speaking with the correct person using two identifiers.  Patient Location: Home  Provider Location: Home Office  I discussed the limitations of evaluation and management by telemedicine. The patient expressed understanding and agreed to proceed.  Vital Signs: Because this visit was a virtual/telehealth visit, some criteria may be missing or patient reported. Any vitals not documented were not able to be obtained and vitals that have been documented are patient reported.  VideoDeclined- This patient declined Librarian, academic. Therefore the visit was completed with audio only.  Persons Participating in Visit: Patient.  AWV Questionnaire: No: Patient Medicare AWV questionnaire was not completed prior to this visit.  Cardiac Risk Factors include: advanced age (>61men, >2 women);diabetes mellitus;dyslipidemia;hypertension;sedentary lifestyle     Objective:    Today's Vitals   08/22/23 1003  BP: 127/77  Weight: 170 lb (77.1 kg)  Height: 5\' 5"  (1.651 m)   Body mass index is 28.29 kg/m.     08/22/2023   10:09 AM 04/09/2023   10:13 PM 08/08/2022    9:16 AM 08/03/2022    9:47 AM  05/18/2022    4:10 PM 12/28/2021    3:55 PM 08/21/2021    9:44 PM  Advanced Directives  Does Patient Have a Medical Advance Directive? No No No No Yes No No  Type of Advance Directive     Living will    Would patient like information on creating a medical advance directive? Yes (MAU/Ambulatory/Procedural Areas - Information given) No - Patient declined Yes (ED - Information included in AVS)   No - Patient declined     Current Medications (verified) Outpatient Encounter Medications as of 08/22/2023  Medication Sig   ALPHAGAN P 0.1 % SOLN PLACE 1 DROP INTO BOTH EYES EVERY MORNING   amLODipine (NORVASC) 10 MG tablet TAKE 1 TABLET(10 MG) BY MOUTH EVERY MORNING   benazepril (LOTENSIN) 40 MG tablet TAKE 1 TABLET(40 MG) BY MOUTH DAILY   Blood Glucose Monitoring Suppl (ACCU-CHEK GUIDE) w/Device KIT 1 each by Does not apply route 4 (four) times daily.   carvedilol (COREG) 12.5 MG tablet TAKE 1 TABLET(12.5 MG) BY MOUTH TWICE DAILY WITH A MEAL   cholecalciferol (VITAMIN D3) 25 MCG (1000 UT) tablet Take 1,000 Units by mouth in the morning.   clopidogrel (PLAVIX) 75 MG tablet TAKE 1 TABLET(75 MG) BY MOUTH DAILY   Continuous Glucose Receiver (FREESTYLE LIBRE 3 READER) DEVI Use to test BG 4 + times daily. E11.65   ezetimibe (ZETIA) 10 MG tablet Take 1 tablet (10 mg total) by mouth daily.   glucose blood (ACCU-CHEK GUIDE TEST) test strip USE TO CHECK BLOOD SUGAR IN THE MORNING, AT NOON, IN THE EVENING AND AT BEDTIME  insulin isophane & regular human KwikPen (HUMULIN 70/30 KWIKPEN) (70-30) 100 UNIT/ML KwikPen INJECT 50 UNITS WITH BREAKFAST AND 40 UNITS WITH SUPPER WHEN GLUCOSE READING ABOVE 90   Insulin Pen Needle (B-D ULTRAFINE III SHORT PEN) 31G X 8 MM MISC USE WITH INSULIN TWICE DAILY AS DIRECTED   latanoprost (XALATAN) 0.005 % ophthalmic solution Place 1 drop into both eyes at bedtime.    lubiprostone (AMITIZA) 24 MCG capsule Take 1 capsule (24 mcg total) by mouth 2 (two) times daily with a meal.   metFORMIN  (GLUCOPHAGE) 500 MG tablet TAKE 1 TABLET(500 MG) BY MOUTH DAILY WITH BREAKFAST   Multiple Vitamin (MULTIVITAMIN WITH MINERALS) TABS tablet Take 1 tablet by mouth daily.   Omega-3 Fatty Acids (FISH OIL) 1200 MG CAPS Take 1,200 mg by mouth every morning.   pantoprazole (PROTONIX) 40 MG tablet Take 1 tablet (40 mg total) by mouth daily. 30 minutes breakfast   pravastatin (PRAVACHOL) 80 MG tablet Take 1 tablet (80 mg total) by mouth every evening.   metoprolol tartrate (LOPRESSOR) 50 MG tablet Take 1 tablet (50 mg total) by mouth once for 1 dose. TAKE 2 HOURS PRIOR TO PROCEDURE   No facility-administered encounter medications on file as of 08/22/2023.    Allergies (verified) Inspra [eplerenone], Senokot wheat bran [wheat], and Spironolactone   History: Past Medical History:  Diagnosis Date   Anginal pain (HCC)    Arthritis    OA   Diabetes mellitus    Dyspnea    Female bladder prolapse    GERD (gastroesophageal reflux disease)    Glaucoma    Hyperlipidemia    Hypertension    echo and stress 4/10 reports on chart, EKG ` LOV 9/12 on chart   S/P TAVR (transcatheter aortic valve replacement) 03/23/2021   Edwards 23mm S3U TF approach with Dr. Clifton James and Dr. Laneta Simmers   Severe aortic stenosis    Thyroid disease    Past Surgical History:  Procedure Laterality Date   ABDOMINAL HYSTERECTOMY     ANTERIOR AND POSTERIOR REPAIR  04/26/2011   Procedure: ANTERIOR (CYSTOCELE) AND POSTERIOR REPAIR (RECTOCELE);  Surgeon: Martina Sinner, MD;  Location: [facility];  Service: Urology;  Laterality: N/A;   BIOPSY  05/25/2020   Procedure: BIOPSY;  Surgeon: Sherrilyn Rist, MD;  Location: [facility];  Service: Gastroenterology;;   CATARACT EXTRACTION Bilateral    with IOL   CHOLECYSTECTOMY  2009   COLONOSCOPY N/A 12/02/2013   three colon polyps removed, small internal hemorrhoids. Hyperplastic polyps   COLONOSCOPY N/A 11/20/2019   pancolonic diverticulosis, two 10-11 mm polyps in ascending colon,  one 5 mm polyp in cecum, ascending colon with superficially invasive adenocarcinoma arising in background of sessile serrated polyps with low and high grade cytologic dysplasia.   COLONOSCOPY     COLONOSCOPY W/ POLYPECTOMY     COLONOSCOPY WITH PROPOFOL N/A 05/25/2020   diverticulosis in right colon, redundant colon. Caution warranted on future colonoscopy in light of age, cardiac condition, challenging anatomy.    DILATION AND CURETTAGE OF UTERUS     pt states this was in the 1970's   ESOPHAGOGASTRODUODENOSCOPY (EGD) WITH PROPOFOL N/A 05/25/2020   Grade 1 esophageal varices, single mucosal nodule in stomach s/p biopsy. (hyperplastic)>    LEFT HEART CATH  09/10/2008   normal coronary arteries, normal LV systolic function, EF 65% (Dr. Claudia Desanctis)   LYMPH NODE DISSECTION Right 1997   under arm   NM MYOCAR PERF WALL MOTION  2010   dipyridamole -  mild-mod in intenstiy perfusion defect in mid anterior, mid anteroseptal wall, EF 70%   OVARY SURGERY     bilateral tumors removed   POLYPECTOMY  11/20/2019   Procedure: POLYPECTOMY;  Surgeon: Corbin Ade, MD;  Location: [facility];  Service: Endoscopy;;  hot and cold snare cecal polyp, and asending polyps x 2   RIGHT/LEFT HEART CATH AND CORONARY ANGIOGRAPHY N/A 10/02/2020   Procedure: RIGHT/LEFT HEART CATH AND CORONARY ANGIOGRAPHY;  Surgeon: Swaziland, Peter M, MD;  Location: [facility] INVASIVE CV LAB;  Service: Cardiovascular;  Laterality: N/A;   THYROIDECTOMY     THYROIDECTOMY  02/2008   TRANSCATHETER AORTIC VALVE REPLACEMENT, TRANSFEMORAL N/A 03/23/2021   Procedure: TRANSCATHETER AORTIC VALVE REPLACEMENT, TRANSFEMORAL;  Surgeon: Kathleene Hazel, MD;  Location: [facility] INVASIVE CV LAB;  Service: Open Heart Surgery;  Laterality: N/A;   TRANSTHORACIC ECHOCARDIOGRAM  08/2011   EF=>55%, mild conc LVH; trace MR; mild TR; mild-mod AV calcification with mild valvular AV stenosis   VAGINAL PROLAPSE REPAIR  04/26/2011   Procedure: VAGINAL VAULT SUSPENSION;   Surgeon: Martina Sinner, MD;  Location: [facility];  Service: Urology;  Laterality: N/A;  with Graft  10x6   Family History  Problem Relation Age of Onset   Stomach cancer Mother 91   Heart disease Mother 72       heart disease   Heart disease Father 56       MI   Stroke Maternal Grandfather    Heart attack Paternal Grandfather    Hypertension Brother    Bone cancer Brother    Hypertension Sister    Liver cancer Sister    Hypertension Sister    Hypertension Child    Colon cancer Neg Hx    Colon polyps Neg Hx    Pancreatic cancer Neg Hx    Esophageal cancer Neg Hx    Rectal cancer Neg Hx    Social History   Socioeconomic History   Marital status: Married    Spouse name: [name]   Number of children: 1   Years of education: Trade   Highest education level: 12th grade  Occupational History   Occupation: Retired   Occupation: Insurance account manager  Tobacco Use   Smoking status: Never   Smokeless tobacco: Never  Vaping Use   Vaping status: Never Used  Substance and Sexual Activity   Alcohol use: Not Currently   Drug use: No   Sexual activity: Yes  Other Topics Concern   Not on file  Social History Narrative   Patient lives at home with spouse. 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 LIVED IN [place] FOR 50 YRS AND CAME BACK TO [place] ~2009. Caffeine Use: 1 cup of coffee daily. HAD ONE CHILD: PASSED 5 YRS. HAVE THREE GRAND-KIDS AND TWO GREAT GRANDS.    Social Drivers of Health   Financial Resource Strain: Medium Risk (08/22/2023)   Overall Financial Resource Strain (CARDIA)    Difficulty of Paying Living Expenses: Somewhat hard  Food Insecurity: No Food Insecurity (08/22/2023)   Hunger Vital Sign    Worried About Running Out of Food in the Last Year: Never true    Ran Out of Food in the Last Year: Never true  Transportation Needs: No Transportation Needs (08/22/2023)   PRAPARE - Administrator, Civil Service (Medical): No    Lack of  Transportation (Non-Medical): No  Physical Activity: Sufficiently Active (08/22/2023)   Exercise Vital Sign    Days of Exercise per  Week: 7 days    Minutes of Exercise per Session: 30 min  Stress: No Stress Concern Present (08/22/2023)   [employer] of Occupational Health - Occupational Stress Questionnaire    Feeling of Stress : Not at all  Social Connections: Socially Integrated (08/22/2023)   Social Connection and Isolation Panel [NHANES]    Frequency of Communication with Friends and Family: More than three times a week    Frequency of Social Gatherings with Friends and Family: Once a week    Attends Religious Services: More than 4 times per year    Active Member of [employer] or Organizations: Yes    Attends Banker Meetings: 1 to 4 times per year    Marital Status: Married    Tobacco Counseling Counseling given: Yes    Clinical Intake:  Pre-visit preparation completed: Yes  Pain : No/denies pain     BMI - recorded: 28.29 Nutritional Status: BMI 25 -29 Overweight Nutritional Risks: None Diabetes: Yes CBG done?: No (telehealth visit.) Did pt. bring in CBG monitor from home?: No  Lab Results  Component Value Date   HGBA1C 8.4 (A) 07/21/2023   HGBA1C 7.7 (A) 03/23/2023   HGBA1C 6.8 11/10/2022     How often do you need to have someone help you when you read instructions, pamphlets, or other written materials from your doctor or pharmacy?: 1 - Never  Interpreter Needed?: No  Information entered by :: Maryjean Ka CMA   Activities of Daily Living     08/22/2023   10:10 AM  In your present state of health, do you have any difficulty performing the following activities:  Hearing? 0  Vision? 0  Difficulty concentrating or making decisions? 0  Walking or climbing stairs? 0  Dressing or bathing? 0  Doing errands, shopping? 0  Preparing Food and eating ? N  Using the Toilet? N  In the past six months, have you accidently leaked urine? N  Do you have problems  with loss of bowel control? N  Managing your Medications? N  Managing your Finances? N  Housekeeping or managing your Housekeeping? N    Patient Care Team: Kerri Perches, MD as PCP - General (Family Medicine) Rennis Golden Lisette Abu, MD as PCP - Cardiology (Cardiology) Rennis Golden Lisette Abu, MD as Attending Physician (Cardiology) Mare Loan, RD as Dietitian (Nutrition) Lanelle Bal, DO as Consulting Physician (Internal Medicine) Roma Kayser, MD as Referring Physician (Endocrinology) Freddie Breech, DPM as Consulting Physician (Podiatry) Bond, Doran Stabler, MD as Referring Physician (Ophthalmology)  Indicate any recent Medical Services you may have received from other than [facility] providers in the past year (date may be approximate).     Assessment:   This is a routine wellness examination for Crysta.  Hearing/Vision screen Hearing Screening - Comments:: Patient denies any hearing difficulties.   Vision Screening - Comments:: Wears rx glasses - up to date with routine eye exams  Sees Dr. Chiquita Loth in [place]     Goals Addressed             This Visit's Progress    Patient Stated       To go on a cruise        Depression Screen     08/22/2023   10:12 AM 07/19/2023    8:13 AM 06/07/2023    9:09 AM 02/07/2023   10:33 AM 10/07/2022    9:45 AM 08/23/2022    8:44 AM 08/08/2022    9:19 AM  PHQ 2/9 Scores  PHQ - 2 Score 0 0 0 0 3 3 0  PHQ- 9 Score 0    10 8     Fall Risk     08/22/2023   10:09 AM 06/07/2023    9:09 AM 02/07/2023   10:33 AM 10/07/2022    9:45 AM 08/23/2022    8:43 AM  Fall Risk   Falls in the past year? 0 0 0 0 0  Number falls in past yr: 0 0 0 0 0  Injury with Fall? 0 0 0 0 0  Risk for fall due to : No Fall Risks No Fall Risks No Fall Risks No Fall Risks No Fall Risks  Follow up Falls prevention discussed;Falls evaluation completed Falls evaluation completed Falls evaluation completed Falls evaluation completed Falls evaluation completed     MEDICARE RISK AT HOME:  Medicare Risk at Home Any stairs in or around the home?: No If so, are there any without handrails?: No Home free of loose throw rugs in walkways, pet beds, electrical cords, etc?: Yes Adequate lighting in your home to reduce risk of falls?: Yes Life alert?: No Use of a cane, walker or w/c?: No Grab bars in the bathroom?: No Shower chair or bench in shower?: No Elevated toilet seat or a handicapped toilet?: Yes  TIMED UP AND GO:  Was the test performed?  No  Cognitive Function: 6CIT completed    07/23/2014    9:48 AM  MMSE - Mini Mental State Exam  Orientation to time 5  Orientation to Place 5  Registration 3  Attention/ Calculation 4  Recall 1  Language- name 2 objects 2  Language- repeat 1  Language- follow 3 step command 3  Language- read & follow direction 1  Write a sentence 1  Copy design 1  Total score 27        08/22/2023   10:06 AM 08/08/2022    9:20 AM 08/02/2021    8:39 AM 07/29/2020    8:26 AM 07/29/2019    8:09 AM  6CIT Screen  What Year? 0 points 0 points 0 points 0 points 0 points  What month? 0 points 0 points 0 points 0 points 0 points  What time? 0 points 0 points 0 points 0 points 0 points  Count back from 20 0 points 0 points 0 points 0 points 0 points  Months in reverse 0 points 0 points 0 points 0 points 0 points  Repeat phrase 0 points 0 points 0 points 0 points 0 points  Total Score 0 points 0 points 0 points 0 points 0 points    Immunizations Immunization History  Administered Date(s) Administered   Hepatitis B, ADULT 09/27/2021   MODERNA COVID-19 SARS-COV-2 PEDS BIVALENT BOOSTER 36yr-57yr 01/09/2023   Moderna SARS-COV2 Booster Vaccination 11/19/2020   Moderna Sars-Covid-2 Vaccination 09/13/2019, 10/12/2019, 04/29/2020   Pneumococcal Conjugate-13 12/11/2013   Pneumococcal Polysaccharide-23 01/13/2010   Tdap 10/05/2010, 08/23/2022    Screening Tests Health Maintenance  Topic Date Due   DEXA SCAN   01/15/2016   OPHTHALMOLOGY EXAM  03/09/2022   COVID-19 Vaccine (5 - 2024-25 season) 03/06/2023   FOOT EXAM  08/03/2023   Diabetic kidney evaluation - Urine ACR  09/13/2023   Zoster Vaccines- Shingrix (1 of 2) 05/15/2024 (Originally 12/20/1959)   INFLUENZA VACCINE  12/15/2023   HEMOGLOBIN A1C  01/21/2024   MAMMOGRAM  02/01/2024   Diabetic kidney evaluation - eGFR measurement  05/25/2024   Medicare Annual Wellness (AWV)  08/21/2024  DTaP/Tdap/Td (3 - Td or Tdap) 08/22/2032   Pneumonia Vaccine 47+ Years old  Completed   HPV VACCINES  Aged Out   Hepatitis C Screening  Discontinued    Health Maintenance  Health Maintenance Due  Topic Date Due   DEXA SCAN  01/15/2016   OPHTHALMOLOGY EXAM  03/09/2022   COVID-19 Vaccine (5 - 2024-25 season) 03/06/2023   FOOT EXAM  08/03/2023   Diabetic kidney evaluation - Urine ACR  09/13/2023   Health Maintenance Items Addressed: DEXA scheduled, UACR (Urine Albumin:Creatinine Ratio)  Additional Screening:  Vision Screening: Recommended annual ophthalmology exams for early detection of glaucoma and other disorders of the eye. Last eye exam requested Dental Screening: Recommended annual dental exams for proper oral hygiene  Community Resource Referral / Chronic Care Management: CRR required this visit?  No   CCM required this visit?  No     Plan:     I have personally reviewed and noted the following in the patient's chart:   Medical and social history Use of alcohol, tobacco or illicit drugs  Current medications and supplements including opioid prescriptions. Patient is not currently taking opioid prescriptions. Functional ability and status Nutritional status Physical activity Advanced directives List of other physicians Hospitalizations, surgeries, and ER visits in previous 12 months Vitals Screenings to include cognitive, depression, and falls Referrals and appointments  In addition, I have reviewed and discussed with patient  certain preventive protocols, quality metrics, and best practice recommendations. A written personalized care plan for preventive services as well as general preventive health recommendations were provided to patient.     Jordan Hawks Jerzy Crotteau, CMA   08/22/2023   After Visit Summary: (Mail) Due to this being a telephonic visit, the after visit summary with patients personalized plan was offered to patient via mail   Notes: Please refer to Routing Comments.

## 2023-08-23 ENCOUNTER — Telehealth: Payer: Self-pay

## 2023-08-23 NOTE — Telephone Encounter (Signed)
 Patient was identified as falling into the [place] Measure - Diabetes.   Patient was: Appointment already scheduled for:  09/06/23.

## 2023-08-29 ENCOUNTER — Ambulatory Visit (HOSPITAL_COMMUNITY)
Admission: RE | Admit: 2023-08-29 | Discharge: 2023-08-29 | Disposition: A | Discharge status: Home / Self Care | Source: Ambulatory Visit | Attending: Family Medicine | Admitting: Family Medicine

## 2023-08-29 DIAGNOSIS — Z780 Asymptomatic menopausal state: Secondary | ICD-10-CM | POA: Diagnosis not present

## 2023-09-11 ENCOUNTER — Encounter: Payer: Self-pay | Admitting: Family Medicine

## 2023-09-13 ENCOUNTER — Other Ambulatory Visit: Payer: Self-pay | Admitting: Gastroenterology

## 2023-09-20 ENCOUNTER — Telehealth: Payer: Self-pay

## 2023-09-20 NOTE — Telephone Encounter (Signed)
 Tried to return call to pt, had to leave a message requesting pt return call to the office.

## 2023-09-21 ENCOUNTER — Telehealth: Payer: Self-pay | Admitting: Family Medicine

## 2023-09-21 NOTE — Telephone Encounter (Signed)
 Copied from CRM [id number]. Topic: Clinical - Medication Refill >> Sep 21, 2023  9:52 AM Joanna Brown wrote: Medication: Continuous Glucose Receiver (FREESTYLE LIBRE 3 READER) DEVI [[id number]]  Has the patient contacted their pharmacy? Yes  (Agent: If yes, when and what did the pharmacy advise?) Contact Office   This is the patient's preferred pharmacy:  [id number] - [place], [place] - [address] SEC [address] [place] [address] Phone: [phone] Fax: [phone]  Is this the correct pharmacy for this prescription? Yes  Has the prescription been filled recently? Yes  Is the patient out of the medication? Yes  Has the patient been seen for an appointment in the last year OR does the patient have an upcoming appointment? Yes  Can we respond through MyChart? No  Agent: Please be advised that Rx refills may take up to 3 business days. We ask that you follow-up with your pharmacy.

## 2023-09-22 NOTE — Telephone Encounter (Signed)
 Spoke to pt, she states she has her CGM sensors but was having difficulty with her reader for the past 2 days. Stated the reader seems to be working today and she will change her sensor. Advised pt if she had any other trouble with her CGM over the weekend to use a standard glucose meter and bring her CGM devise to the office Monday for evaluation. Pt voiced understanding.

## 2023-09-27 ENCOUNTER — Ambulatory Visit (INDEPENDENT_AMBULATORY_CARE_PROVIDER_SITE_OTHER): Payer: Medicare Other | Admitting: Podiatry

## 2023-09-27 DIAGNOSIS — M79675 Pain in left toe(s): Secondary | ICD-10-CM

## 2023-09-27 DIAGNOSIS — B351 Tinea unguium: Secondary | ICD-10-CM

## 2023-09-27 DIAGNOSIS — M79674 Pain in right toe(s): Secondary | ICD-10-CM | POA: Diagnosis not present

## 2023-09-27 DIAGNOSIS — N183 Chronic kidney disease, stage 3 unspecified: Secondary | ICD-10-CM | POA: Diagnosis not present

## 2023-09-27 DIAGNOSIS — E0822 Diabetes mellitus due to underlying condition with diabetic chronic kidney disease: Secondary | ICD-10-CM

## 2023-09-27 DIAGNOSIS — Z794 Long term (current) use of insulin: Secondary | ICD-10-CM | POA: Diagnosis not present

## 2023-10-01 ENCOUNTER — Emergency Department (HOSPITAL_COMMUNITY)

## 2023-10-01 ENCOUNTER — Encounter: Payer: Self-pay | Admitting: Podiatry

## 2023-10-01 ENCOUNTER — Other Ambulatory Visit: Payer: Self-pay

## 2023-10-01 ENCOUNTER — Encounter (HOSPITAL_COMMUNITY): Payer: Self-pay

## 2023-10-01 ENCOUNTER — Emergency Department (HOSPITAL_COMMUNITY)
Admission: EM | Admit: 2023-10-01 | Discharge: 2023-10-01 | Disposition: A | Discharge status: Home / Self Care | Attending: Emergency Medicine | Admitting: Emergency Medicine

## 2023-10-01 DIAGNOSIS — K5730 Diverticulosis of large intestine without perforation or abscess without bleeding: Secondary | ICD-10-CM | POA: Diagnosis not present

## 2023-10-01 DIAGNOSIS — I700 Atherosclerosis of aorta: Secondary | ICD-10-CM | POA: Diagnosis not present

## 2023-10-01 DIAGNOSIS — Z79899 Other long term (current) drug therapy: Secondary | ICD-10-CM | POA: Insufficient documentation

## 2023-10-01 DIAGNOSIS — Z7984 Long term (current) use of oral hypoglycemic drugs: Secondary | ICD-10-CM | POA: Insufficient documentation

## 2023-10-01 DIAGNOSIS — Z794 Long term (current) use of insulin: Secondary | ICD-10-CM | POA: Diagnosis not present

## 2023-10-01 DIAGNOSIS — M545 Low back pain, unspecified: Secondary | ICD-10-CM | POA: Diagnosis not present

## 2023-10-01 DIAGNOSIS — Z7902 Long term (current) use of antithrombotics/antiplatelets: Secondary | ICD-10-CM | POA: Diagnosis not present

## 2023-10-01 DIAGNOSIS — I10 Essential (primary) hypertension: Secondary | ICD-10-CM | POA: Insufficient documentation

## 2023-10-01 DIAGNOSIS — K439 Ventral hernia without obstruction or gangrene: Secondary | ICD-10-CM | POA: Diagnosis not present

## 2023-10-01 DIAGNOSIS — E119 Type 2 diabetes mellitus without complications: Secondary | ICD-10-CM | POA: Insufficient documentation

## 2023-10-01 DIAGNOSIS — M5459 Other low back pain: Secondary | ICD-10-CM | POA: Diagnosis not present

## 2023-10-01 DIAGNOSIS — R109 Unspecified abdominal pain: Secondary | ICD-10-CM | POA: Diagnosis not present

## 2023-10-01 DIAGNOSIS — K760 Fatty (change of) liver, not elsewhere classified: Secondary | ICD-10-CM | POA: Insufficient documentation

## 2023-10-01 LAB — CBC WITH DIFFERENTIAL/PLATELET
Abs Immature Granulocytes: 0.02 10*3/uL (ref 0.00–0.07)
Basophils Absolute: 0.1 10*3/uL (ref 0.0–0.1)
Basophils Relative: 1 %
Eosinophils Absolute: 0.2 10*3/uL (ref 0.0–0.5)
Eosinophils Relative: 3 %
HCT: 40.4 % (ref 36.0–46.0)
Hemoglobin: 14 g/dL (ref 12.0–15.0)
Immature Granulocytes: 0 %
Lymphocytes Relative: 40 %
Lymphs Abs: 2.6 10*3/uL (ref 0.7–4.0)
MCH: 29 pg (ref 26.0–34.0)
MCHC: 34.7 g/dL (ref 30.0–36.0)
MCV: 83.8 fL (ref 80.0–100.0)
Monocytes Absolute: 0.4 10*3/uL (ref 0.1–1.0)
Monocytes Relative: 6 %
Neutro Abs: 3.2 10*3/uL (ref 1.7–7.7)
Neutrophils Relative %: 50 %
Platelets: 152 10*3/uL (ref 150–400)
RBC: 4.82 MIL/uL (ref 3.87–5.11)
RDW: 13.4 % (ref 11.5–15.5)
WBC: 6.5 10*3/uL (ref 4.0–10.5)
nRBC: 0 % (ref 0.0–0.2)

## 2023-10-01 LAB — BASIC METABOLIC PANEL WITH GFR
Anion gap: 9 (ref 5–15)
BUN: 17 mg/dL (ref 8–23)
CO2: 27 mmol/L (ref 22–32)
Calcium: 9.7 mg/dL (ref 8.9–10.3)
Chloride: 97 mmol/L — ABNORMAL LOW (ref 98–111)
Creatinine, Ser: 0.85 mg/dL (ref 0.44–1.00)
GFR, Estimated: >60 mL/min (ref >60)
Glucose, Bld: 134 mg/dL — ABNORMAL HIGH (ref 70–99)
Potassium: 2.9 mmol/L — ABNORMAL LOW (ref 3.5–5.1)
Sodium: 133 mmol/L — ABNORMAL LOW (ref 135–145)

## 2023-10-01 LAB — URINALYSIS, ROUTINE W REFLEX MICROSCOPIC
Bacteria, UA: NONE SEEN
Bilirubin Urine: NEGATIVE
Glucose, UA: 150 mg/dL — AB
Hgb urine dipstick: NEGATIVE
Ketones, ur: NEGATIVE mg/dL
Leukocytes,Ua: NEGATIVE
Nitrite: NEGATIVE
Protein, ur: 100 mg/dL — AB
Specific Gravity, Urine: 1.013 (ref 1.005–1.030)
pH: 6 (ref 5.0–8.0)

## 2023-10-01 MED ORDER — KETOROLAC TROMETHAMINE 15 MG/ML IJ SOLN
15.0000 mg | Freq: Once | INTRAMUSCULAR | Status: AC
  Administered 2023-10-01: 15 mg via INTRAVENOUS
  Filled 2023-10-01: qty 1

## 2023-10-01 MED ORDER — HYDROCODONE-ACETAMINOPHEN 5-325 MG PO TABS
1.0000 | ORAL_TABLET | Freq: Once | ORAL | Status: AC
  Administered 2023-10-01: 1 via ORAL
  Filled 2023-10-01: qty 1

## 2023-10-01 MED ORDER — POTASSIUM CHLORIDE ER 10 MEQ PO TBCR: 10.0000 meq | EXTENDED_RELEASE_TABLET | Freq: Two times a day (BID) | ORAL | 0 refills | Status: AC

## 2023-10-01 MED ORDER — IOHEXOL 300 MG/ML  SOLN
100.0000 mL | Freq: Once | INTRAMUSCULAR | Status: AC | PRN
  Administered 2023-10-01: 100 mL via INTRAVENOUS

## 2023-10-01 MED ORDER — LIDOCAINE 5 % EX PTCH
1.0000 | MEDICATED_PATCH | CUTANEOUS | Status: DC
  Administered 2023-10-01: 1 via TRANSDERMAL
  Filled 2023-10-01: qty 1

## 2023-10-01 MED ORDER — HYDROCODONE-ACETAMINOPHEN 5-325 MG PO TABS: 2.0000 | ORAL_TABLET | Freq: Four times a day (QID) | ORAL | 0 refills | Status: AC | PRN

## 2023-10-01 MED ORDER — POTASSIUM CHLORIDE CRYS ER 20 MEQ PO TBCR
40.0000 meq | EXTENDED_RELEASE_TABLET | Freq: Once | ORAL | Status: AC
  Administered 2023-10-01: 40 meq via ORAL
  Filled 2023-10-01: qty 2

## 2023-10-01 NOTE — ED Notes (Signed)
 [name] made aware of BP 195/79. Pt reports she has not missed any home BP meds recently.

## 2023-10-01 NOTE — Discharge Instructions (Addendum)
 You were seen in the ER today for back pain.  Your CT scan did not show any acute findings but you do have degenerative changes in your spine which is likely the cause of the pain.  They had incidental finding of some thickening of your bladder wall, please follow-up with urology.  There is no sign of urinary tract infection.  You are prescribed medication to help with your pain.  Your blood pressure was very elevated today, call your PCP to have recheck in the next 2-3 days  Your blood work showed that your potassium was low, we have given you potassium here and a few days of supplements at home as well.  Come back to the ER for new or worsening symptoms.

## 2023-10-01 NOTE — ED Notes (Signed)
AVS with prescriptions provided to and discussed with patient and family member at bedside. Pt verbalizes understanding of discharge instructions and denies any questions or concerns at this time. Pt has ride home. Pt ambulated out of department independently with steady gait.  

## 2023-10-01 NOTE — ED Provider Notes (Signed)
 [place] [facility] AT [facility] Provider Note   CSN: [id number] Arrival date & time: 10/01/23  1254     History  Chief Complaint  Patient presents with   Back Pain    Joanna Brown is a 83 y.o. female.  She is complaining of right lower back pain for the past week, now having some midline lower back pain.  She is not having any abdominal pain, urinary symptoms, no chest pain or shortness of breath.  Denies injury or trauma.  The pain does not radiate.  She has no saddle anesthesia or paresthesia, no bowel or bladder incontinence.  Denies any unintentional weight loss.  She has tried Tylenol  at home without relief.   Back Pain      Home Medications Prior to Admission medications   Medication Sig Start Date End Date Taking? Authorizing Provider  HYDROcodone -acetaminophen  (NORCO/VICODIN) 5-325 MG tablet Take 2 tablets by mouth every 6 (six) hours as needed. 10/01/23  Yes Sueko Dimichele A, PA-C  ALPHAGAN  P 0.1 % SOLN PLACE 1 DROP INTO BOTH EYES EVERY MORNING 03/21/23   [provider]  amLODipine  (NORVASC ) 10 MG tablet TAKE 1 TABLET(10 MG) BY MOUTH EVERY MORNING 08/22/22   Towanda Fret, MD  B-D ULTRAFINE III SHORT PEN 31G X 8 MM MISC USE WITH INSULIN  TWICE DAILY AS DIRECTED 08/22/23   Nida, Gebreselassie W, MD  benazepril  (LOTENSIN ) 40 MG tablet TAKE 1 TABLET(40 MG) BY MOUTH DAILY 06/20/23   Towanda Fret, MD  Blood Glucose Monitoring Suppl (ACCU-CHEK GUIDE) w/Device KIT 1 each by Does not apply route 4 (four) times daily. 01/06/23   Nida, Gebreselassie W, MD  carvedilol  (COREG ) 12.5 MG tablet TAKE 1 TABLET(12.5 MG) BY MOUTH TWICE DAILY WITH A MEAL 06/05/23   Towanda Fret, MD  cholecalciferol (VITAMIN D3) 25 MCG (1000 UT) tablet Take 1,000 Units by mouth in the morning.    [provider]  clopidogrel  (PLAVIX ) 75 MG tablet TAKE 1 TABLET(75 MG) BY MOUTH DAILY 07/14/23   Towanda Fret, MD  Continuous Glucose Receiver  (FREESTYLE LIBRE 3 READER) DEVI Use to test BG 4 + times daily. E11.65 01/12/23   Baby Bolt, MD  ezetimibe  (ZETIA ) 10 MG tablet Take 1 tablet (10 mg total) by mouth daily. 02/15/23 02/10/24  Hilty, Aviva Lemmings, MD  glucose blood (ACCU-CHEK GUIDE TEST) test strip USE TO CHECK BLOOD SUGAR IN THE MORNING, AT NOON, IN THE EVENING AND AT BEDTIME 04/24/23   Nida, Gebreselassie W, MD  insulin  isophane & regular human KwikPen (HUMULIN  70/30 KWIKPEN) (70-30) 100 UNIT/ML KwikPen INJECT 50 UNITS WITH BREAKFAST AND 40 UNITS WITH SUPPER WHEN GLUCOSE READING ABOVE 90 07/21/23   Nida, Gebreselassie W, MD  latanoprost  (XALATAN ) 0.005 % ophthalmic solution Place 1 drop into both eyes at bedtime.  02/18/19   [provider]  lubiprostone  (AMITIZA ) 24 MCG capsule Take 1 capsule (24 mcg total) by mouth 2 (two) times daily with a meal. 03/30/23   Delman Ferns, NP  metFORMIN  (GLUCOPHAGE ) 500 MG tablet TAKE 1 TABLET(500 MG) BY MOUTH DAILY WITH BREAKFAST 06/01/23   Nida, Gebreselassie W, MD  metoprolol  tartrate (LOPRESSOR ) 50 MG tablet Take 1 tablet (50 mg total) by mouth once for 1 dose. TAKE 2 HOURS PRIOR TO PROCEDURE 06/19/23 06/19/23  Ava Boatman, NP  Multiple Vitamin (MULTIVITAMIN WITH MINERALS) TABS tablet Take 1 tablet by mouth daily.    [provider]  Omega-3 Fatty Acids (FISH OIL) 1200  MG CAPS Take 1,200 mg by mouth every morning.    [provider]  pantoprazole  (PROTONIX ) 40 MG tablet TAKE 1 TABLET BY MOUTH DAILY 30 MINUTES BEFORE BREAKFAST 09/13/23   Delman Ferns, NP  pravastatin  (PRAVACHOL ) 80 MG tablet Take 1 tablet (80 mg total) by mouth every evening. 02/15/23 08/22/23  Hazle Lites, MD      Allergies    Inspra [eplerenone], Senokot wheat bran [wheat], and Spironolactone     Review of Systems   Review of Systems  Musculoskeletal:  Positive for back pain.    Physical Exam Updated Vital Signs BP (!) 174/77   Pulse (!) 58   Temp 98.4 F (36.9 C) (Oral)   Resp 16    SpO2 98%  Physical Exam Vitals and nursing note reviewed.  Constitutional:      General: She is not in acute distress.    Appearance: She is well-developed.  HENT:     Head: Normocephalic and atraumatic.     Mouth/Throat:     Mouth: Mucous membranes are moist.  Eyes:     Extraocular Movements: Extraocular movements intact.     Conjunctiva/sclera: Conjunctivae normal.     Pupils: Pupils are equal, round, and reactive to light.  Cardiovascular:     Rate and Rhythm: Normal rate and regular rhythm.     Heart sounds: No murmur heard. Pulmonary:     Effort: Pulmonary effort is normal. No respiratory distress.     Breath sounds: Normal breath sounds.  Abdominal:     Palpations: Abdomen is soft.     Tenderness: There is no abdominal tenderness. There is no right CVA tenderness, left CVA tenderness, guarding or rebound.     Hernia: No hernia is present.  Musculoskeletal:        General: No swelling.     Cervical back: Normal and neck supple.     Thoracic back: Normal.     Lumbar back: Normal.  Skin:    General: Skin is warm and dry.     Capillary Refill: Capillary refill takes less than 2 seconds.  Neurological:     General: No focal deficit present.     Mental Status: She is alert and oriented to person, place, and time.  Psychiatric:        Mood and Affect: Mood normal.     ED Results / Procedures / Treatments   Labs (all labs ordered are listed, but only abnormal results are displayed) Labs Reviewed  URINALYSIS, ROUTINE W REFLEX MICROSCOPIC - Abnormal; Notable for the following components:      Result Value   Glucose, UA 150 (*)    Protein, ur 100 (*)    All other components within normal limits  BASIC METABOLIC PANEL WITH GFR - Abnormal; Notable for the following components:   Sodium 133 (*)    Potassium 2.9 (*)    Chloride 97 (*)    Glucose, Bld 134 (*)    All other components within normal limits  CBC WITH DIFFERENTIAL/PLATELET    EKG None  Radiology CT  ABDOMEN PELVIS W CONTRAST Result Date: 10/01/2023 CLINICAL DATA:  Abdominal/flank pain, stone suspected Right-sided back pain. EXAM: CT ABDOMEN AND PELVIS WITH CONTRAST TECHNIQUE: Multidetector CT imaging of the abdomen and pelvis was performed using the standard protocol following bolus administration of intravenous contrast. RADIATION DOSE REDUCTION: This exam was performed according to the departmental dose-optimization program which includes automated exposure control, adjustment of the mA and/or kV according to patient size  and/or use of iterative reconstruction technique. CONTRAST:  OMNIPAQUE  IOHEXOL  300 MG/ML  SOLN COMPARISON:  Right upper quadrant ultrasound 09/09/2022 abdominal CTA 01/28/2021 FINDINGS: Lower chest: The heart is mildly enlarged. TAVR. No basilar airspace disease or pleural effusion. Hepatobiliary: Mild diffuse hepatic steatosis. No focal liver abnormality. Cholecystectomy without biliary dilatation. Pancreas: No ductal dilatation or inflammation. Spleen: Normal in size without focal abnormality. Adrenals/Urinary Tract: No adrenal nodule. No hydronephrosis or renal inflammation. No renal calculi. No renal mass. Mild wall thickening at the dome of the bladder with adjacent punctate calcification. Stomach/Bowel: The stomach is nondistended. No small bowel obstruction or inflammation. Scattered fecalization of small bowel contents. Normal appendix visualized. Moderate volume of stool throughout the colon. Mild sigmoid diverticulosis without diverticulitis. Vascular/Lymphatic: Aortic atherosclerosis. No aortic aneurysm. The portal vein is patent. No acute vascular findings. No abdominopelvic adenopathy. Reproductive: Status post hysterectomy. No adnexal masses. Other: No free air or ascites. Postsurgical change of the anterior abdominal wall with small fat containing infraumbilical ventral abdominal wall hernia. Musculoskeletal: Mild diffuse degenerative disc disease and facet hypertrophy.  There are no acute or suspicious osseous abnormalities. IMPRESSION: 1. Mild wall thickening at the dome of the bladder with adjacent punctate calcification. This may be due to chronic inflammation. Recommend correlation with urinalysis and consider cystoscopy. 2. No renal calculi or hydronephrosis. 3. Mild hepatic steatosis. 4. Mild sigmoid diverticulosis without diverticulitis. Aortic Atherosclerosis (ICD10-I70.0). Electronically Signed   By: Chadwick Colonel M.D.   On: 10/01/2023 17:40    Procedures Procedures    Medications Ordered in ED Medications  lidocaine  (LIDODERM ) 5 % 1 patch (1 patch Transdermal Patch Applied 10/01/23 1755)  iohexol  (OMNIPAQUE ) 300 MG/ML solution 100 mL (100 mLs Intravenous Contrast Given 10/01/23 1703)  ketorolac  (TORADOL ) 15 MG/ML injection 15 mg (15 mg Intravenous Given 10/01/23 1755)  potassium chloride  SA (KLOR-CON  M) CR tablet 40 mEq (40 mEq Oral Given 10/01/23 1754)  HYDROcodone -acetaminophen  (NORCO/VICODIN) 5-325 MG per tablet 1 tablet (1 tablet Oral Given 10/01/23 1834)    ED Course/ Medical Decision Making/ A&P                                 Medical Decision Making This patient presents to the ED for concern of low back pain, this involves an extensive number of treatment options, and is a complaint that carries with it a high risk of complications and morbidity.  The differential diagnosis includes muscle strain, contusion, kidney stone, UTI, paraspinous abscess, vertebral fracture, other   Co morbidities that complicate the patient evaluation :   Hypertension, diabetes aortic stenosis status post TAVR   Additional history obtained:  Additional history obtained from EMR External records from outside source obtained and reviewed including prior notes, labs   Lab Tests:  I Ordered, and personally interpreted labs.  The pertinent results include: CBC-normal, BMP-patient has hypokalemia with potassium of 2.9 UA shows proteinuria, no UTI   Imaging  Studies ordered:  I ordered imaging studies including CT abdomen pelvis which shows thickening of the dome of the bladder, diffuse degenerative changes of the L-spine, mild fatty liver I independently visualized and interpreted imaging within scope of identifying emergent findings  I agree with the radiologist interpretation     Problem List / ED Course / Critical interventions / Medication management  Back pain-patient has pain with movement, not having radicular pain and not able to reproduce it on exam.  No obvious muscle spasm.  Given patient's age and comorbidities, CT scan to rule out any significant intra-abdominal pathology.  She does have significant lumbar degenerative changes which is likely the cause of her pain, she does have pain with range of motion.  She is noted to be hypertensive in the ER, blood pressures are the same in both arms.  She is not having chest pain or shortness of breath and pain has been ongoing for a week, I have very low suspicion for aortic dissection or other intrathoracic or intra-abdominal catastrophe.  She was given Toradol  and Norco for pain with some improvement.  Blood pressure elevated but she states it is often "up-and-down".  We discussed add another medication versus close outpatient follow-up with her PCP and she is going to call her PCP tomorrow.  Also considered possible epidural abscess or epidural bleeding, patient has no numbness ting or weakness in her legs, she is not having intractable pain and pain has been present for a week without getting worse.  I think this is very unlikely.  Do not feel she needs MRI or further testing.  I have reviewed the patients home medicines and have made adjustments as needed   Social Determinants of Health:  Patient lives with her husband       Amount and/or Complexity of Data Reviewed Labs: ordered. Radiology: ordered.  Risk Prescription drug management.           Final Clinical  Impression(s) / ED Diagnoses Final diagnoses:  None    Rx / DC Orders ED Discharge Orders          Ordered    HYDROcodone -acetaminophen  (NORCO/VICODIN) 5-325 MG tablet  Every 6 hours PRN        10/01/23 1824              Aimee Houseman, PA-C 10/01/23 1836    Jerilynn Montenegro, MD 10/04/23 1229

## 2023-10-01 NOTE — Progress Notes (Signed)
  Subjective:  Patient ID: Joanna Brown, female    DOB: 1940/05/31,  MRN: [id number]  83 y.o. female presents at risk foot care. Pt has h/o NIDDM with chronic kidney disease and painful elongated mycotic toenails 1-5 bilaterally which are tender when wearing enclosed shoe gear. Pain is relieved with periodic professional debridement.  New problem(s): None   PCP is Towanda Fret, MD , and last visit was June 07, 2023.  Allergies  Allergen Reactions   Inspra [Eplerenone] Itching   Senokot Wheat Bran [Wheat]     ABDOMINAL CRAMPS   Spironolactone      Stomach problems, vision changes     Review of Systems: Negative except as noted in the HPI.   Objective:  [name] is a pleasant 83 y.o. female WD, WN in NAD. AAO x 3.  Vascular Examination: Vascular status intact b/l with palpable pedal pulses. CFT immediate b/l. Pedal hair present. No edema. No pain with calf compression b/l. Skin temperature gradient WNL b/l. No varicosities noted. No cyanosis or clubbing noted.  Neurological Examination: Sensation grossly intact b/l with 10 gram monofilament.   Dermatological Examination: Pedal skin with normal turgor, texture and tone b/l. No open wounds nor interdigital macerations noted. Toenails 1-5 b/l thick, discolored, elongated with subungual debris and pain on dorsal palpation. No hyperkeratotic lesions noted b/l.   Musculoskeletal Examination: Muscle strength 5/5 to b/l LE.  No pain, crepitus noted b/l. HAV with bunion b/l. Patient ambulates independently without assistive aids.   Radiographs: None  Last A1c:      Latest Ref Rng & Units 07/21/2023   11:10 AM 03/23/2023   10:35 AM 11/10/2022   10:36 AM  Hemoglobin A1C  Hemoglobin-A1c 0.0 - 7.0 % 8.4  7.7  6.8      Assessment:   1. Pain due to onychomycosis of toenails of both feet   2. Diabetes mellitus due to underlying condition with stage 3 chronic kidney disease, with long-term current use of insulin ,  unspecified whether stage 3a or 3b CKD (HCC)     Plan:  Patient was evaluated and treated. All patient's and/or POA's questions/concerns addressed on today's visit. Toenails 1-5 debrided in length and girth without incident. Continue foot and shoe inspections daily. Monitor blood glucose per PCP/Endocrinologist's recommendations. Continue soft, supportive shoe gear daily. Report any pedal injuries to medical professional. Call office if there are any questions/concerns. -Patient/POA to call should there be question/concern in the interim.  Return in about 3 months (around 12/28/2023).  Luella Sager, DPM      [facility] LOCATION: [address], [place] [address]                   Office [phone]   [name] LOCATION: [address] [place], [place] [address] Office [phone]

## 2023-10-01 NOTE — ED Triage Notes (Addendum)
 Pt presents with R sided back pain without radiation x 1 week. Denies injury, urinary symptoms. Pt does have some constipation with last BM 3 days ago and pt reports that is normal for her to have. Pt has tried 500  mg Tylenol  for the pain without relief.

## 2023-10-01 NOTE — ED Notes (Signed)
 Pt transported to CT via stretcher.

## 2023-10-02 ENCOUNTER — Other Ambulatory Visit: Payer: Self-pay | Admitting: Family Medicine

## 2023-10-04 DIAGNOSIS — D631 Anemia in chronic kidney disease: Secondary | ICD-10-CM | POA: Diagnosis not present

## 2023-10-04 DIAGNOSIS — N189 Chronic kidney disease, unspecified: Secondary | ICD-10-CM | POA: Diagnosis not present

## 2023-10-04 DIAGNOSIS — R809 Proteinuria, unspecified: Secondary | ICD-10-CM | POA: Diagnosis not present

## 2023-10-05 ENCOUNTER — Encounter: Payer: Self-pay | Admitting: *Deleted

## 2023-10-05 ENCOUNTER — Encounter: Payer: Self-pay | Admitting: Gastroenterology

## 2023-10-05 ENCOUNTER — Ambulatory Visit (INDEPENDENT_AMBULATORY_CARE_PROVIDER_SITE_OTHER): Payer: Medicare Other | Admitting: Gastroenterology

## 2023-10-05 VITALS — BP 134/68 | HR 67 | Temp 97.1°F | Ht 65.0 in | Wt 171.5 lb

## 2023-10-05 DIAGNOSIS — K5900 Constipation, unspecified: Secondary | ICD-10-CM

## 2023-10-05 DIAGNOSIS — K219 Gastro-esophageal reflux disease without esophagitis: Secondary | ICD-10-CM | POA: Diagnosis not present

## 2023-10-05 DIAGNOSIS — K7460 Unspecified cirrhosis of liver: Secondary | ICD-10-CM | POA: Diagnosis not present

## 2023-10-05 DIAGNOSIS — K7469 Other cirrhosis of liver: Secondary | ICD-10-CM

## 2023-10-05 NOTE — Patient Instructions (Addendum)
 Recommend fiber such as Metamucil or Benefiber each day. You can continue Miralax  daily as needed but hopefully with adding fiber, you won't need as much Miralax .   Continue pantoprazole  once daily, 30 minutes before eating.  I have ordered a routine ultrasound for liver care. We will check labs in 3 months.   I will see you in 6 months!  I enjoyed seeing you again today! I value our relationship and want to provide genuine, compassionate, and quality care. You may receive a survey regarding your visit with me, and I welcome your feedback! Thanks so much for taking the time to complete this. I look forward to seeing you again.      Delman Ferns, PhD, ANP-BC [facility]

## 2023-10-05 NOTE — Progress Notes (Unsigned)
 Gastroenterology Office Note     Primary Care Physician:  Towanda Fret, MD  Primary Gastroenterologist:   Chief Complaint   Chief Complaint  Patient presents with   Follow-up    Patient here today for a follow up on Gerd. She says the symptoms of reflux are better since she has changed her diet. She is taking Pantoprazole  40 mg daily.      History of Present Illness   Joanna Brown is a 83 y.o. female presenting today with a history of     superficial invasive adenocarcinoma arising in background of a sessile serrated polyp in ascending colon in July 2021, surveillance colonoscopy in Jan 2022 but caution  warranted for colonoscopy in future due to age and cardiac condition. Cirrhosis noted on imaging; Hep C screening 2014 negative. Negative viral serologies.  EGD 2022 also performed with Grade 1 esophageal varices. Other GI issues including chronic constipation and GERD.    Cirrhosis care: US  abdomen April 2024 no hepatoma. Wouyld like to do every 6 months now.  EGD 2022 with grade 1 varices Completed Hep and A and B vaccinations AFP tumor marker would like to do yearly.   Difficulty finding veins.   GERD: belching started about 2 weeks ago. GERD better with changin diet. Used to eat steaks. Now not eating meat. Eating chicken occasionally. No spicy foods. Sometimes bread feels hung up. No difficulty with chicken or rice. No unintentional weight loss. Wants to just monitor for now.   No abdominal pain. Taking Miralax  when feels like she needs it. Miralax  about twice a week. BM about twice a week. Wants to avoid amitiza  as caused sweating and significant nausea. Linzess  not covered.      Past Medical History:  Diagnosis Date   Anginal pain (HCC)    Arthritis    OA   Diabetes mellitus    Dyspnea    Female bladder prolapse    GERD (gastroesophageal reflux disease)    Glaucoma    Hyperlipidemia    Hypertension    echo and stress 4/10 reports on chart,  EKG ` LOV 9/12 on chart   S/P TAVR (transcatheter aortic valve replacement) 03/23/2021   Edwards 23mm S3U TF approach with Dr. Abel Hoe and Dr. Sherene Dilling   Severe aortic stenosis    Thyroid  disease     Past Surgical History:  Procedure Laterality Date   ABDOMINAL HYSTERECTOMY     ANTERIOR AND POSTERIOR REPAIR  04/26/2011   Procedure: ANTERIOR (CYSTOCELE) AND POSTERIOR REPAIR (RECTOCELE);  Surgeon: Devorah Fonder, MD;  Location: [facility];  Service: Urology;  Laterality: N/A;   BIOPSY  05/25/2020   Procedure: BIOPSY;  Surgeon: Albertina Hugger, MD;  Location: [facility];  Service: Gastroenterology;;   CATARACT EXTRACTION Bilateral    with IOL   CHOLECYSTECTOMY  2009   COLONOSCOPY N/A 12/02/2013   three colon polyps removed, small internal hemorrhoids. Hyperplastic polyps   COLONOSCOPY N/A 11/20/2019   pancolonic diverticulosis, two 10-11 mm polyps in ascending colon, one 5 mm polyp in cecum, ascending colon with superficially invasive adenocarcinoma arising in background of sessile serrated polyps with low and high grade cytologic dysplasia.   COLONOSCOPY     COLONOSCOPY W/ POLYPECTOMY     COLONOSCOPY WITH PROPOFOL  N/A 05/25/2020   diverticulosis in right colon, redundant colon. Caution warranted on future colonoscopy in light of age, cardiac condition, challenging anatomy.    DILATION AND CURETTAGE OF UTERUS     pt states  this was in the 1970's   ESOPHAGOGASTRODUODENOSCOPY (EGD) WITH PROPOFOL  N/A 05/25/2020   Grade 1 esophageal varices, single mucosal nodule in stomach s/p biopsy. (hyperplastic)>    LEFT HEART CATH  09/10/2008   normal coronary arteries, normal LV systolic function, EF 65% (Dr. Tammy Fallen)   LYMPH NODE DISSECTION Right 1997   under arm   NM MYOCAR PERF WALL MOTION  2010   dipyridamole - mild-mod in intenstiy perfusion defect in mid anterior, mid anteroseptal wall, EF 70%   OVARY SURGERY     bilateral tumors removed   POLYPECTOMY  11/20/2019   Procedure:  POLYPECTOMY;  Surgeon: Suzette Espy, MD;  Location: [facility];  Service: Endoscopy;;  hot and cold snare cecal polyp, and asending polyps x 2   RIGHT/LEFT HEART CATH AND CORONARY ANGIOGRAPHY N/A 10/02/2020   Procedure: RIGHT/LEFT HEART CATH AND CORONARY ANGIOGRAPHY;  Surgeon: Swazil[place], Peter M, MD;  Location: [facility] INVASIVE CV LAB;  Service: Cardiovascular;  Laterality: N/A;   THYROIDECTOMY     THYROIDECTOMY  02/2008   TRANSCATHETER AORTIC VALVE REPLACEMENT, TRANSFEMORAL N/A 03/23/2021   Procedure: TRANSCATHETER AORTIC VALVE REPLACEMENT, TRANSFEMORAL;  Surgeon: Odie Benne, MD;  Location: [facility] INVASIVE CV LAB;  Service: Open Heart Surgery;  Laterality: N/A;   TRANSTHORACIC ECHOCARDIOGRAM  08/2011   EF=>55%, mild conc LVH; trace MR; mild TR; mild-mod AV calcification with mild valvular AV stenosis   VAGINAL PROLAPSE REPAIR  04/26/2011   Procedure: VAGINAL VAULT SUSPENSION;  Surgeon: Devorah Fonder, MD;  Location: [facility];  Service: Urology;  Laterality: N/A;  with Graft  10x6    Current Outpatient Medications  Medication Sig Dispense Refill   amLODipine  (NORVASC ) 10 MG tablet TAKE 1 TABLET(10 MG) BY MOUTH EVERY MORNING 90 tablet 3   B-D ULTRAFINE III SHORT PEN 31G X 8 MM MISC USE WITH INSULIN  TWICE DAILY AS DIRECTED 200 each 0   benazepril  (LOTENSIN ) 40 MG tablet TAKE 1 TABLET(40 MG) BY MOUTH DAILY 90 tablet 1   Blood Glucose Monitoring Suppl (ACCU-CHEK GUIDE) w/Device KIT 1 each by Does not apply route 4 (four) times daily. 1 kit 0   carvedilol  (COREG ) 12.5 MG tablet TAKE 1 TABLET(12.5 MG) BY MOUTH TWICE DAILY WITH A MEAL 60 tablet 3   cholecalciferol (VITAMIN D3) 25 MCG (1000 UT) tablet Take 1,000 Units by mouth in the morning.     clopidogrel  (PLAVIX ) 75 MG tablet TAKE 1 TABLET(75 MG) BY MOUTH DAILY 90 tablet 3   Continuous Glucose Receiver (FREESTYLE LIBRE 3 READER) DEVI Use to test BG 4 + times daily. E11.65 1 each 0   ezetimibe  (ZETIA ) 10 MG tablet Take 1 tablet (10 mg  total) by mouth daily. 90 tablet 3   glucose blood (ACCU-CHEK GUIDE TEST) test strip USE TO CHECK BLOOD SUGAR IN THE MORNING, AT NOON, IN THE EVENING AND AT BEDTIME 150 strip 2   HYDROcodone -acetaminophen  (NORCO/VICODIN) 5-325 MG tablet Take 2 tablets by mouth every 6 (six) hours as needed. 4 tablet 0   insulin  isophane & regular human KwikPen (HUMULIN  70/30 KWIKPEN) (70-30) 100 UNIT/ML KwikPen INJECT 50 UNITS WITH BREAKFAST AND 40 UNITS WITH SUPPER WHEN GLUCOSE READING ABOVE 90 30 mL 0   latanoprost  (XALATAN ) 0.005 % ophthalmic solution Place 1 drop into both eyes at bedtime.      metFORMIN  (GLUCOPHAGE ) 500 MG tablet TAKE 1 TABLET(500 MG) BY MOUTH DAILY WITH BREAKFAST 90 tablet 1   Multiple Vitamin (MULTIVITAMIN WITH MINERALS) TABS tablet Take 1 tablet by  mouth daily.     Omega-3 Fatty Acids (FISH OIL) 1200 MG CAPS Take 1,200 mg by mouth every morning.     pantoprazole  (PROTONIX ) 40 MG tablet TAKE 1 TABLET BY MOUTH DAILY 30 MINUTES BEFORE BREAKFAST 90 tablet 0   potassium chloride  (KLOR-CON ) 10 MEQ tablet Take 1 tablet (10 mEq total) by mouth 2 (two) times daily. 6 tablet 0   pravastatin  (PRAVACHOL ) 80 MG tablet Take 1 tablet (80 mg total) by mouth every evening. 90 tablet 3   lubiprostone  (AMITIZA ) 24 MCG capsule Take 1 capsule (24 mcg total) by mouth 2 (two) times daily with a meal. (Patient not taking: Reported on 10/05/2023) 60 capsule 3   No current facility-administered medications for this visit.    Allergies as of 10/05/2023 - Review Complete 10/05/2023  Allergen Reaction Noted   Inspra [eplerenone] Itching 06/19/2023   Senokot wheat bran [wheat]  01/18/2018   Spironolactone   03/24/2014    Family History  Problem Relation Age of Onset   Stomach cancer Mother 19   Heart disease Mother 79       heart disease   Heart disease Father 60       MI   Stroke Maternal Grandfather    Heart attack Paternal Grandfather    Hypertension Brother    Bone cancer Brother    Hypertension Sister     Liver cancer Sister    Hypertension Sister    Hypertension Child    Colon cancer Neg Hx    Colon polyps Neg Hx    Pancreatic cancer Neg Hx    Esophageal cancer Neg Hx    Rectal cancer Neg Hx     Social History   Socioeconomic History   Marital status: Married    Spouse name: [name]   Number of children: 1   Years of education: Trade   Highest education level: 12th grade  Occupational History   Occupation: Retired   Occupation: Insurance account manager  Tobacco Use   Smoking status: Never   Smokeless tobacco: Never  Vaping Use   Vaping status: Never Used  Substance and Sexual Activity   Alcohol use: Not Currently   Drug use: No   Sexual activity: Yes  Other Topics Concern   Not on file  Social History Narrative   Patient lives at home with spouse. 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 LIVED IN [place] FOR 50 YRS AND CAME BACK TO [place] ~2009. Caffeine Use: 1 cup of coffee daily. HAD ONE CHILD: PASSED 5 YRS. HAVE THREE GRAND-KIDS AND TWO GREAT GRANDS.    Social Drivers of Health   Financial Resource Strain: Medium Risk (08/22/2023)   Overall Financial Resource Strain (CARDIA)    Difficulty of Paying Living Expenses: Somewhat hard  Food Insecurity: No Food Insecurity (08/22/2023)   Hunger Vital Sign    Worried About Running Out of Food in the Last Year: Never true    Ran Out of Food in the Last Year: Never true  Transportation Needs: No Transportation Needs (08/22/2023)   PRAPARE - Administrator, Civil Service (Medical): No    Lack of Transportation (Non-Medical): No  Physical Activity: Sufficiently Active (08/22/2023)   Exercise Vital Sign    Days of Exercise per Week: 7 days    Minutes of Exercise per Session: 30 min  Stress: No Stress Concern Present (08/22/2023)   [employer] of Occupational Health - Occupational Stress Questionnaire    Feeling of Stress : Not at  all  Social Connections: Socially Integrated (08/22/2023)   Social  Connection and Isolation Panel [NHANES]    Frequency of Communication with Friends and Family: More than three times a week    Frequency of Social Gatherings with Friends and Family: Once a week    Attends Religious Services: More than 4 times per year    Active Member of [employer] or Organizations: Yes    Attends Banker Meetings: 1 to 4 times per year    Marital Status: Married  Catering manager Violence: Not At Risk (08/22/2023)   Humiliation, Afraid, Rape, and Kick questionnaire    Fear of Current or Ex-Partner: No    Emotionally Abused: No    Physically Abused: No    Sexually Abused: No     Review of Systems   Gen: Denies any fever, chills, fatigue, weight loss, lack of appetite.  CV: Denies chest pain, heart palpitations, peripheral edema, syncope.  Resp: Denies shortness of breath at rest or with exertion. Denies wheezing or cough.  GI: Denies dysphagia or odynophagia. Denies jaundice, hematemesis, fecal incontinence. GU : Denies urinary burning, urinary frequency, urinary hesitancy MS: Denies joint pain, muscle weakness, cramps, or limitation of movement.  Derm: Denies rash, itching, dry skin Psych: Denies depression, anxiety, memory loss, and confusion Heme: Denies bruising, bleeding, and enlarged lymph nodes.   Physical Exam   BP 134/68 (BP Location: Right Arm, Patient Position: Sitting, Cuff Size: Large)   Pulse 67   Temp (!) 97.1 F (36.2 C) (Temporal)   Ht 5\' 5"  (1.651 m)   Wt 171 lb 8 oz (77.8 kg)   BMI 28.54 kg/m  General:   Alert and oriented. Pleasant and cooperative. Well-nourished and well-developed.  Head:  Normocephalic and atraumatic. Eyes:  Without icterus Abdomen:  +BS, soft, non-tender and non-distended. No HSM noted. No guarding or rebound. No masses appreciated.  Rectal:  Deferred  Msk:  Symmetrical without gross deformities. Normal posture. Extremities:  Without edema. Neurologic:  Alert and  oriented x4;  grossly normal  neurologically. Skin:  Intact without significant lesions or rashes. Psych:  Alert and cooperative. Normal mood and affect.   Assessment   Joanna Brown is a 83 y.o. female presenting today with a history of    PLAN   *****    Delman Ferns, PhD, ANP-BC [facility]

## 2023-10-06 ENCOUNTER — Encounter: Payer: Self-pay | Admitting: Family Medicine

## 2023-10-06 ENCOUNTER — Ambulatory Visit (INDEPENDENT_AMBULATORY_CARE_PROVIDER_SITE_OTHER): Payer: Federal, State, Local not specified - PPO | Admitting: Family Medicine

## 2023-10-06 VITALS — BP 160/80 | Temp 98.3°F | Ht 65.0 in | Wt 174.0 lb

## 2023-10-06 DIAGNOSIS — M5442 Lumbago with sciatica, left side: Secondary | ICD-10-CM

## 2023-10-06 DIAGNOSIS — Z794 Long term (current) use of insulin: Secondary | ICD-10-CM | POA: Diagnosis not present

## 2023-10-06 DIAGNOSIS — I10 Essential (primary) hypertension: Secondary | ICD-10-CM | POA: Diagnosis not present

## 2023-10-06 DIAGNOSIS — G8929 Other chronic pain: Secondary | ICD-10-CM | POA: Diagnosis not present

## 2023-10-06 DIAGNOSIS — R9341 Abnormal radiologic findings on diagnostic imaging of renal pelvis, ureter, or bladder: Secondary | ICD-10-CM | POA: Diagnosis not present

## 2023-10-06 DIAGNOSIS — E782 Mixed hyperlipidemia: Secondary | ICD-10-CM | POA: Diagnosis not present

## 2023-10-06 DIAGNOSIS — R7989 Other specified abnormal findings of blood chemistry: Secondary | ICD-10-CM | POA: Diagnosis not present

## 2023-10-06 DIAGNOSIS — Z09 Encounter for follow-up examination after completed treatment for conditions other than malignant neoplasm: Secondary | ICD-10-CM | POA: Diagnosis not present

## 2023-10-06 DIAGNOSIS — E1169 Type 2 diabetes mellitus with other specified complication: Secondary | ICD-10-CM | POA: Diagnosis not present

## 2023-10-06 NOTE — Assessment & Plan Note (Signed)
 Uncontrolled, needs to take increased dose coreg  that was prescribed at previous visit DASH diet and commitment to daily physical activity for a minimum of 30 minutes discussed and encouraged, as a part of hypertension management. The importance of attaining a healthy weight is also discussed.     10/06/2023   10:12 AM 10/06/2023    9:49 AM 10/05/2023    9:24 AM 10/01/2023    6:21 PM 10/01/2023    6:01 PM 10/01/2023    2:19 PM 08/22/2023   10:03 AM  BP/Weight  Systolic BP 160 150 134 174 195 174 127  Diastolic BP 80 62 68 77 79 85 77  Wt. (Lbs)  174 171.5    170  BMI  28.96 kg/m2 28.54 kg/m2    28.29 kg/m2

## 2023-10-06 NOTE — Patient Instructions (Addendum)
 F/u in 6 weeks re eval blood pressure, call if you need me sooner  Correct dose carvedilol  is 12.5 mg twice daily, start taking that and stop the 6.25 mg tablet as we discussed please  Lipid, chem 7 , CBc and TSH ,  magnesium  and vit D today  You are referred to Urology re abnormal scan  Thanks for choosing [facility], we consider it a privelige to serve you.

## 2023-10-06 NOTE — Assessment & Plan Note (Signed)
 Updated lab shows corrected currently

## 2023-10-06 NOTE — Assessment & Plan Note (Signed)
 lipoma Updated lab needed at/ before next visit.

## 2023-10-07 LAB — CMP14+EGFR
ALT: 12 IU/L (ref 0–32)
AST: 16 IU/L (ref 0–40)
Albumin: 4.4 g/dL (ref 3.7–4.7)
Alkaline Phosphatase: 51 IU/L (ref 44–121)
BUN/Creatinine Ratio: 20 (ref 12–28)
BUN: 19 mg/dL (ref 8–27)
Bilirubin Total: 0.3 mg/dL (ref 0.0–1.2)
CO2: 25 mmol/L (ref 20–29)
Calcium: 9.9 mg/dL (ref 8.7–10.3)
Chloride: 93 mmol/L — ABNORMAL LOW (ref 96–106)
Creatinine, Ser: 0.97 mg/dL (ref 0.57–1.00)
Globulin, Total: 2.8 g/dL (ref 1.5–4.5)
Glucose: 234 mg/dL — ABNORMAL HIGH (ref 70–99)
Potassium: 3.7 mmol/L (ref 3.5–5.2)
Sodium: 136 mmol/L (ref 134–144)
Total Protein: 7.2 g/dL (ref 6.0–8.5)
eGFR: 59 mL/min/{1.73_m2} — ABNORMAL LOW (ref >59)

## 2023-10-07 LAB — CBC
Hematocrit: 40.9 % (ref 34.0–46.6)
Hemoglobin: 13.3 g/dL (ref 11.1–15.9)
MCH: 28.3 pg (ref 26.6–33.0)
MCHC: 32.5 g/dL (ref 31.5–35.7)
MCV: 87 fL (ref 79–97)
Platelets: 157 10*3/uL (ref 150–450)
RBC: 4.7 x10E6/uL (ref 3.77–5.28)
RDW: 13.5 % (ref 11.7–15.4)
WBC: 6.7 10*3/uL (ref 3.4–10.8)

## 2023-10-07 LAB — LIPID PANEL W/O CHOL/HDL RATIO
Cholesterol, Total: 139 mg/dL (ref 100–199)
HDL: 38 mg/dL — ABNORMAL LOW (ref >39)
LDL Chol Calc (NIH): 68 mg/dL (ref 0–99)
Triglycerides: 200 mg/dL — ABNORMAL HIGH (ref 0–149)
VLDL Cholesterol Cal: 33 mg/dL (ref 5–40)

## 2023-10-07 LAB — MAGNESIUM: Magnesium: 1.7 mg/dL (ref 1.6–2.3)

## 2023-10-08 ENCOUNTER — Ambulatory Visit: Payer: Self-pay | Admitting: Family Medicine

## 2023-10-08 DIAGNOSIS — R9341 Abnormal radiologic findings on diagnostic imaging of renal pelvis, ureter, or bladder: Secondary | ICD-10-CM | POA: Insufficient documentation

## 2023-10-08 DIAGNOSIS — Z09 Encounter for follow-up examination after completed treatment for conditions other than malignant neoplasm: Secondary | ICD-10-CM | POA: Insufficient documentation

## 2023-10-08 NOTE — Progress Notes (Signed)
 Joanna Brown     MRN: [id number]      DOB: 06-01-1940  Chief Complaint  Patient presents with   Follow-up    Follow up for her BP    HPI Joanna Brown is here for follow up and re-evaluation of chronic medical conditions, medication management and review of any available recent lab and radiology data.  Preventive health is updated, specifically  Cancer screening and Immunization.   Seen in ED on 5/18 for acute back pain, of significance, scan shows possible abnormality in bladder which needs eval by Urology She also has an abdominal US  pending per GI Back pain is better The PT denies any adverse reactions to current medications since the last visit.  Reports uncontrolled and fluctuating blood sugar values , has upcoming appt with endo who manages, reports compliance with meds    ROS Denies recent fever or chills. Denies sinus pressure, nasal congestion, ear pain or sore throat. Denies chest congestion, productive cough or wheezing. Denies chest pains, palpitations and leg swelling Denies abdominal pain, nausea, vomiting,diarrhea or constipation.   Denies dysuria, frequency, hesitancy or incontinence. Denies uncontrolled  joint pain, swelling and limitation in mobility. Denies headaches, seizures, numbness, or tingling. Denies depression, anxiety or insomnia. Denies skin break down or rash.   PE  BP (!) 160/80   Temp 98.3 F (36.8 C) (Oral)   Ht 5\' 5"  (1.651 m)   Wt 174 lb (78.9 kg)   SpO2 96%   BMI 28.96 kg/m   Patient alert and oriented and in no cardiopulmonary distress.  HEENT: No facial asymmetry, EOMI,     Neck supple .  Chest: Clear to auscultation bilaterally.  CVS: S1, S2 nsystolic murmur, no S3.Regular rate.  ABD: Soft non tender.   Ext: No edema  MS: decreased ROM spine, shoulders, hips and knees.  Skin: Intact, no ulcerations or rash noted.  Psych: Good eye contact, normal affect. Memory intact not anxious or depressed appearing.  CNS: CN 2-12  intact, power,  normal throughout.no focal deficits noted.   Assessment & Plan  Essential hypertension Uncontrolled, needs to take increased dose coreg  that was prescribed at previous visit DASH diet and commitment to daily physical activity for a minimum of 30 minutes discussed and encouraged, as a part of hypertension management. The importance of attaining a healthy weight is also discussed.     10/06/2023   10:12 AM 10/06/2023    9:49 AM 10/05/2023    9:24 AM 10/01/2023    6:21 PM 10/01/2023    6:01 PM 10/01/2023    2:19 PM 08/22/2023   10:03 AM  BP/Weight  Systolic BP 160 150 134 174 195 174 127  Diastolic BP 80 62 68 77 79 85 77  Wt. (Lbs)  174 171.5    170  BMI  28.96 kg/m2 28.54 kg/m2    28.29 kg/m2       Mixed hyperlipidemia Hyperlipidemia:Low fat diet discussed and encouraged.   Lipid Panel  Lab Results  Component Value Date   CHOL 139 10/06/2023   HDL 38 (L) 10/06/2023   LDLCALC 68 10/06/2023   TRIG 200 (H) 10/06/2023   CHOLHDL 4.0 05/26/2023     Needs to reduce fat in diet , no med change at this time, would benefit from changing to crestor  will discuss at next visit  Low vitamin D  level Updated lab shows corrected currently  Low back pain with left-sided sciatica Improved followin recent Ed visit, has arthritis and disc disease,  x ray in 2021  Abnormal CT scan, bladder Refer Urology for furhter eval, thickening of dome of bladder reported  Type 2 diabetes mellitus with other specified complication (HCC) Diabetes associated with hypertension, hyperlipidemia, arthritis, and heart disease  Joanna Brown is reminded of the importance of commitment to daily physical activity for 30 minutes or more, as able and the need to limit carbohydrate intake to 30 to 60 grams per meal to help with blood sugar control.   The need to take medication as prescribed, test blood sugar as directed, and to call between visits if there is a concern that blood sugar is  uncontrolled is also discussed.  Managed by Endo has f/u scheduled , currently uncontrolled  Joanna Brown is reminded of the importance of daily foot exam, annual eye examination, and good blood sugar, blood pressure and cholesterol control.     Latest Ref Rng & Units 10/06/2023   10:46 AM 10/01/2023    4:12 PM 07/21/2023   11:10 AM 07/20/2023    3:36 PM 05/26/2023    8:06 AM  Diabetic Labs  HbA1c 0.0 - 7.0 %   8.4     Chol 100 - 199 mg/dL 604     540   HDL >98 mg/dL 38     41   Calc LDL 0 - 99 mg/dL 68     89   Triglycerides 0 - 149 mg/dL 119     147   Creatinine 0.57 - 1.00 mg/dL 8.29  5.62   1.30  8.65       10/06/2023   10:12 AM 10/06/2023    9:49 AM 10/05/2023    9:24 AM 10/01/2023    6:21 PM 10/01/2023    6:01 PM 10/01/2023    2:19 PM 08/22/2023   10:03 AM  BP/Weight  Systolic BP 160 150 134 174 195 174 127  Diastolic BP 80 62 68 77 79 85 77  Wt. (Lbs)  174 171.5    170  BMI  28.96 kg/m2 28.54 kg/m2    28.29 kg/m2      Latest Ref Rng & Units 08/03/2021    9:20 AM 03/09/2021   12:00 AM  Foot/eye exam completion dates  Eye Exam No Retinopathy  No Retinopathy      Foot Form Completion  Done      This result is from an external source.        Encounter for examination following treatment at hospital Patient in for follow up of recent ED visit Discharge summary, and laboratory and radiology data are reviewed, and any questions or concerns  are discussed. Specific issues requiring follow up are specifically addressed.

## 2023-10-08 NOTE — Assessment & Plan Note (Signed)
 Refer Urology for furhter eval, thickening of dome of bladder reported

## 2023-10-08 NOTE — Assessment & Plan Note (Signed)
 Diabetes associated with hypertension, hyperlipidemia, arthritis, and heart disease  Joanna Brown is reminded of the importance of commitment to daily physical activity for 30 minutes or more, as able and the need to limit carbohydrate intake to 30 to 60 grams per meal to help with blood sugar control.   The need to take medication as prescribed, test blood sugar as directed, and to call between visits if there is a concern that blood sugar is uncontrolled is also discussed.  Managed by Endo has f/u scheduled , currently uncontrolled  Joanna Brown is reminded of the importance of daily foot exam, annual eye examination, and good blood sugar, blood pressure and cholesterol control.     Latest Ref Rng & Units 10/06/2023   10:46 AM 10/01/2023    4:12 PM 07/21/2023   11:10 AM 07/20/2023    3:36 PM 05/26/2023    8:06 AM  Diabetic Labs  HbA1c 0.0 - 7.0 %   8.4     Chol 100 - 199 mg/dL 454     098   HDL >11 mg/dL 38     41   Calc LDL 0 - 99 mg/dL 68     89   Triglycerides 0 - 149 mg/dL 914     782   Creatinine 0.57 - 1.00 mg/dL 9.56  2.13   0.86  5.78       10/06/2023   10:12 AM 10/06/2023    9:49 AM 10/05/2023    9:24 AM 10/01/2023    6:21 PM 10/01/2023    6:01 PM 10/01/2023    2:19 PM 08/22/2023   10:03 AM  BP/Weight  Systolic BP 160 150 134 174 195 174 127  Diastolic BP 80 62 68 77 79 85 77  Wt. (Lbs)  174 171.5    170  BMI  28.96 kg/m2 28.54 kg/m2    28.29 kg/m2      Latest Ref Rng & Units 08/03/2021    9:20 AM 03/09/2021   12:00 AM  Foot/eye exam completion dates  Eye Exam No Retinopathy  No Retinopathy      Foot Form Completion  Done      This result is from an external source.

## 2023-10-08 NOTE — Assessment & Plan Note (Addendum)
 Improved followin recent Ed visit, has arthritis and disc disease, x ray in 2021

## 2023-10-08 NOTE — Assessment & Plan Note (Signed)
Patient in for follow up of recent ED visit. Discharge summary, and laboratory and radiology data are reviewed, and any questions or concerns  are discussed. Specific issues requiring follow up are specifically addressed.  

## 2023-10-10 NOTE — Telephone Encounter (Signed)
 Please advise  Copied from CRM [id number]. Topic: Referral - Status >> Oct 10, 2023 10:29 AM Crispin Dolphin wrote: Reason for CRM: Patient called. States she the place she called to hold that they needed something from provider. Let her know referral still pending. Thank You

## 2023-10-11 NOTE — Telephone Encounter (Signed)
-----   Message from Coker B sent at 10/11/2023  7:35 AM EDT ----- At this time Patient only has a Urology Referral placed by us  and looking into the Notes - They have not requested anything further from our Office - They have tried to reach out to Patient for Scheduling at this time. ----- Message ----- From: Tom Found Sent: 10/10/2023  10:49 AM EDT To: Janece Means

## 2023-10-12 ENCOUNTER — Ambulatory Visit (HOSPITAL_COMMUNITY)

## 2023-10-12 DIAGNOSIS — I129 Hypertensive chronic kidney disease with stage 1 through stage 4 chronic kidney disease, or unspecified chronic kidney disease: Secondary | ICD-10-CM | POA: Diagnosis not present

## 2023-10-12 DIAGNOSIS — N1831 Chronic kidney disease, stage 3a: Secondary | ICD-10-CM | POA: Diagnosis not present

## 2023-10-12 DIAGNOSIS — R809 Proteinuria, unspecified: Secondary | ICD-10-CM | POA: Diagnosis not present

## 2023-10-12 DIAGNOSIS — E1129 Type 2 diabetes mellitus with other diabetic kidney complication: Secondary | ICD-10-CM | POA: Diagnosis not present

## 2023-10-19 ENCOUNTER — Ambulatory Visit (HOSPITAL_COMMUNITY)
Admission: RE | Admit: 2023-10-19 | Discharge: 2023-10-19 | Disposition: A | Discharge status: Home / Self Care | Source: Ambulatory Visit | Attending: Gastroenterology | Admitting: Gastroenterology

## 2023-10-19 DIAGNOSIS — Z9049 Acquired absence of other specified parts of digestive tract: Secondary | ICD-10-CM | POA: Diagnosis not present

## 2023-10-19 DIAGNOSIS — R932 Abnormal findings on diagnostic imaging of liver and biliary tract: Secondary | ICD-10-CM | POA: Diagnosis not present

## 2023-10-19 DIAGNOSIS — K7460 Unspecified cirrhosis of liver: Secondary | ICD-10-CM | POA: Diagnosis not present

## 2023-10-19 DIAGNOSIS — K7469 Other cirrhosis of liver: Secondary | ICD-10-CM | POA: Insufficient documentation

## 2023-10-20 ENCOUNTER — Other Ambulatory Visit (INDEPENDENT_AMBULATORY_CARE_PROVIDER_SITE_OTHER): Payer: Self-pay

## 2023-10-20 DIAGNOSIS — K219 Gastro-esophageal reflux disease without esophagitis: Secondary | ICD-10-CM

## 2023-10-20 DIAGNOSIS — K589 Irritable bowel syndrome without diarrhea: Secondary | ICD-10-CM

## 2023-10-20 DIAGNOSIS — N1831 Chronic kidney disease, stage 3a: Secondary | ICD-10-CM

## 2023-10-20 DIAGNOSIS — I10 Essential (primary) hypertension: Secondary | ICD-10-CM

## 2023-10-20 DIAGNOSIS — K7460 Unspecified cirrhosis of liver: Secondary | ICD-10-CM

## 2023-10-20 DIAGNOSIS — E782 Mixed hyperlipidemia: Secondary | ICD-10-CM

## 2023-10-25 ENCOUNTER — Other Ambulatory Visit: Payer: Self-pay | Admitting: Family Medicine

## 2023-10-26 ENCOUNTER — Ambulatory Visit: Payer: Self-pay | Admitting: Gastroenterology

## 2023-11-09 DIAGNOSIS — H401131 Primary open-angle glaucoma, bilateral, mild stage: Secondary | ICD-10-CM | POA: Diagnosis not present

## 2023-11-10 ENCOUNTER — Ambulatory Visit: Payer: Medicare Other | Attending: Internal Medicine | Admitting: Internal Medicine

## 2023-11-10 ENCOUNTER — Encounter: Payer: Self-pay | Admitting: Internal Medicine

## 2023-11-10 VITALS — BP 118/60 | HR 64 | Ht 65.0 in | Wt 170.8 lb

## 2023-11-10 DIAGNOSIS — T8203XD Leakage of heart valve prosthesis, subsequent encounter: Secondary | ICD-10-CM | POA: Insufficient documentation

## 2023-11-10 DIAGNOSIS — I2510 Atherosclerotic heart disease of native coronary artery without angina pectoris: Secondary | ICD-10-CM | POA: Diagnosis not present

## 2023-11-10 DIAGNOSIS — Z952 Presence of prosthetic heart valve: Secondary | ICD-10-CM | POA: Insufficient documentation

## 2023-11-10 DIAGNOSIS — R5383 Other fatigue: Secondary | ICD-10-CM | POA: Diagnosis not present

## 2023-11-10 DIAGNOSIS — R072 Precordial pain: Secondary | ICD-10-CM | POA: Diagnosis not present

## 2023-11-10 NOTE — Progress Notes (Signed)
 OFFICE NOTE  Chief Complaint:  Follow-up aortic stenosis  Primary Care Physician: Antonetta Rollene BRAVO, MD  HPI:  Joanna Brown is a 83 year old female who has a history of mild aortic stenosis with a valve area of approximately 1.7 cm, also a history of dyslipidemia and hypertension, both of which have been well controlled. We are seeing her back for an annual visit today. She reports actually feeling very well, started walking every day, has made major changes to her diet as reflected by a marked decrease in triglycerides. Unfortunately recently she had a mild elevation in liver enzymes slightly above normal on lovastatin . Typically we could tolerate liver enzymes up to 3 times normal on statin medications, however, she was taken off the lovastatin . Either way it is questionable whether she really needs the additional statin at this time and this certainly argues that she should have further workup as to why she had elevated liver enzymes, whether this is due to a new non-alcoholic steatohepatitis or perhaps concomitant medications causing her elevated liver enzymes.  In fact, I reviewed her CT scan today he years ago and she does have steatohepatitis.  Therefore she is not a good candidate for a statin medication. Her blood pressure seems to be well-controlled on her current regimen.  I saw Joanna Brown back in the office today. She is without any new complaints. She occasionally gets some heartburn and is not currently taking medication for that. She denies any chest pain or worsening shortness of breath.  Joanna Brown returns today for follow-up. Again she is without complaints. We discussed her aortic stenosis however she seemed to have little recollection that she has this disorder. I again went over aortic stenosis and the fact that she has mild to moderate narrowing of the aortic valve. This time we are monitoring it clinically. Her last echo was in April of last year. She is asymptomatic.  Her blood pressure is well controlled. She had recent laboratory work which shows an LDL cholesterol of 70 in October which is good control. Her hemoglobin A1c is 7 which is down from 8.3 prior to that. I've encouraged her to continue with this trend is good cholesterol and blood sugar control her helpful in slowing the process of aortic stenosis.  09/12/2016  Joanna Brown was seen today in follow-up. Overall she seems to be doing well. She has no complaints such as shortness of breath or chest pain. EKG is stable showing normal sinus rhythm at 70 with LAFB. Blood pressure is at goal today. She reports her 11 A1c is in the low 7 range. Cholesterol is also been fairly well controlled. She does have a history of moderate aortic stenosis and is due for repeat echo.  09/12/2017  Joanna Brown was seen today in follow-up.  Over the past year she denies any chest pain or worsening shortness of breath.  Unfortunately her hemoglobin A1c is up from the low sevens to 8.2.  Cholesterol also is higher than goal.  Her total cholesterol is 171, HDL 40, LDL 97 and triglycerides 227.  She reports compliance with pravastatin .  She has moderate aortic stenosis with a mean gradient of 20 mmHg based on echo last year and normal LV function.  She did report one brief episode of chest discomfort with more marked exertion a few days ago.  This was right sided underneath the right breast and was a crampy sensation which improved after resting.  I advised that she monitor that further and if  she has more recurrence we may need to consider stress testing.  12/31/2018  Joanna Brown is seen today for routine follow-up.  She was seen in April 2019.  Since then she reports she was doing fairly well up to about 2 weeks ago.  She has had some worsening shortness of breath, particularly at night and while laying down.  She has a mild nonproductive cough.  She also reports some occasional lower extremity edema.  She gets some shortness of  breath with exertion but denies any chest pain, pressure, heaviness or other anginal symptoms.  EKG today shows incomplete right bundle branch pattern.  Her diabetes not well controlled with A1c recently of 8.8.  Her recent cholesterol profile in February showed total cholesterol 145, triglycerides 225, HDL 41 and LDL 73.  She has known aortic stenosis which is at least moderate however not assessed since 2018 by echo.  04/30/2019  Joanna Brown returns today for follow-up.  She had an echo in August 2020 which showed an EF 50 to 55%, moderate LVH, grade 1 diastolic dysfunction.  There was moderate to severe aortic stenosis with a mean gradient of 21 mmHg, AVA by VTI was 1 cm with a dimensionless index of 0.21.  Symptom wise she does report some shortness of breath with exertion but is not consistent.  She has required some pillows to elevate her head at night.  She denies any chest pain at rest.  She has had no presyncopal or syncopal episodes.  10/24/2019  Joanna Brown is seen today in follow-up.  She reports that she has had some chest pain on and off for the past 2 weeks.  She thought initially it might be something that she ate however she has had several episodes with doing house work and other activities that do improve with rest.  She was supposed to have a repeat echo in December and actually is scheduled for repeat echo next week.  On exam today her aortic valve sounds moderately severe.  These could be symptoms associated with the valve.  EKG was personally reviewed shows no new ischemic changes.  11/27/2019  Joanna Brown is seen today in follow-up.  She recently had an echocardiogram to evaluate her aortic stenosis.  As expected the valve is moderately severe.  LVEF is normal with mean gradient 29 mmHg and dimensionless index of 0.26.  Aortic valve area 0.91 cm.  This is more consistent with moderate to severe AS and it appeared visually severely stenotic suggesting possible low-flow low gradient severe  aortic stenosis.  After discussing with her today does not seem like she is having any exertional symptoms, chest pain, heart failure or presyncopal symptoms.  Unfortunately recently she underwent a colonoscopy was found to have a cancerous polyp.  That was removed however a surgical consultation was recommended.  The patient is hesitant with her surgical recommendation and wishes to see another specialist in [place] if possible.  04/16/2020  Joanna Brown returns today for follow-up.  She is due for repeat echo for aortic stenosis but not until January when it scheduled.  She denies any recurrent chest pain or worsening shortness of breath.  She says she is fairly active.  Was felt that she might have a more severe aortic stenosis with a low gradient based on her last echo in June.  Apparently no further work-up was performed regarding her colonoscopy which was abnormal.  She never had surgery because the colon was not appropriately marked.  She is supposed to have another  colonoscopy.  09/24/2020  Joanna Brown is seen today in follow-up as an urgent visit.  Over the past week or so she is noted worsening fatigue and shortness of breath.  She also had an episode that lasted about 30 minutes of left-sided chest discomfort which was felt like a pressure and associated with shortness of breath that eventually resolved.  This occurred at rest.  Over the past several months she has had some decline in her exercise ability.  She used to walk regularly but has backed off on that secondary to this.  Her last echo in January did show moderate to severe aortic stenosis, with a mean gradient of 5 mmHg up from 28 mmHg.  I had recommended a repeat echo in 6 months which would have been July 2022.  03/15/2021  Joanna Brown returns today for follow-up of her aortic stenosis.  She has been seen recently by Dr. Verlin and was noted to have severe aortic stenosis, thought to be a poor surgical candidate but potentially  good candidate for TAVR.  There were concerns about access for her valve.  She has an appointment with Dr. Lucas with surgery on November 7 to discuss this further.  Hopefully she will be a TAVR candidate through a vascular approach.  She denies any worsening shortness of breath or chest pain.  She is had no syncopal episodes.  She has been having some headaches and has been worked up by her PCP.  Is unclear whether this may be related to her moderate to severe aortic stenosis.  10/07/2021  Joanna Brown returns today for follow-up.  She underwent TAVR for aortic stenosis back on March 23, 2021 with a 23 mm Edwards SAPIEN 3 valve.  Subsequent follow-up shows normal leaflet function with a 12 mmHg gradient and no perivalvular leak.  Overall she has done well without any worsening chest pain or shortness of breath.  Blood pressure appears well controlled.  Her A1c was 7.1%.  LDL most recently was 90.  02/15/2023  Joanna Brown is seen today in follow-up.  Recently she saw her PCP and had some lab work done.  This showed extremely high cholesterol with total 266, HDL 39, triglycerides 194 and LDL 190.  About a year ago her LDL was at 90.  She says she has been compliant with her meds although it does indicate in the chart that her Zetia  and her pravastatin  were stopped earlier this year.  Her PCP then added rosuvastatin  20 mg and fenofibrate .  Since starting these medicines she says she has been having stomach upset and has not felt well with them.  This was only a few weeks ago.  Her blood pressure is high today 168/80.  I rechecked it at 160/70.  She was noted on echo last year to have some aortic insufficiency which was new after her TAVR surgery.  She reports some intermittent shortness of breath with exertion but no swelling or other heart failure symptoms.  05/04/2023  Joanna Brown is seen today in follow-up.  When I last saw her she was noted to have some perivalvular leak.  She has had some  intermittent shortness of breath.  I ordered a repeat echo which now shows mild to moderate perivalvular leak.  LV function was normal.  Blood pressure has improved.  We initially discussed using chlorthalidone  however she is currently on eplerenone.  Today blood pressure was 144/72.  Her lipids are also better controlled after readjusting medications.  Total cholesterol now 157,  triglycerides 187, HDL 43 and LDL 82 (down from 190).  11/10/2023  Joanna Brown is here today for follow-up.  She has a main complaint of fatigue.  She recently had had some chest pain symptoms.  She saw Lum Louis, NP who had ordered a cardiac CT scan.  This showed a calcium  score of 620 which was 90th percentile and a high plaque volume but no more than moderate stenosis.  FFR was negative.  She also describes some right-sided chest discomfort.  In general her symptoms are improved somewhat from what they were recently.  She does report generalized fatigue but no significant shortness of breath and tries to stay active.  She did have a worsening perivalvular leak on echo which needs to be reevaluated.  Her last study was in October of 2024.  PMHx:  Past Medical History:  Diagnosis Date   Anginal pain (HCC)    Arthritis    OA   Diabetes mellitus    Dyspnea    Female bladder prolapse    GERD (gastroesophageal reflux disease)    Glaucoma    Hyperlipidemia    Hypertension    echo and stress 4/10 reports on chart, EKG ` LOV 9/12 on chart   S/P TAVR (transcatheter aortic valve replacement) 03/23/2021   Edwards 23mm S3U TF approach with Dr. Verlin and Dr. Lucas   Severe aortic stenosis    Thyroid  disease     Past Surgical History:  Procedure Laterality Date   ABDOMINAL HYSTERECTOMY     ANTERIOR AND POSTERIOR REPAIR  04/26/2011   Procedure: ANTERIOR (CYSTOCELE) AND POSTERIOR REPAIR (RECTOCELE);  Surgeon: Glendia DELENA Elizabeth, MD;  Location: [facility];  Service: Urology;  Laterality: N/A;   BIOPSY  05/25/2020    Procedure: BIOPSY;  Surgeon: Legrand Victory LITTIE DOUGLAS, MD;  Location: [facility];  Service: Gastroenterology;;   CATARACT EXTRACTION Bilateral    with IOL   CHOLECYSTECTOMY  2009   COLONOSCOPY N/A 12/02/2013   three colon polyps removed, small internal hemorrhoids. Hyperplastic polyps   COLONOSCOPY N/A 11/20/2019   pancolonic diverticulosis, two 10-11 mm polyps in ascending colon, one 5 mm polyp in cecum, ascending colon with superficially invasive adenocarcinoma arising in background of sessile serrated polyps with low and high grade cytologic dysplasia.   COLONOSCOPY     COLONOSCOPY W/ POLYPECTOMY     COLONOSCOPY WITH PROPOFOL  N/A 05/25/2020   diverticulosis in right colon, redundant colon. Caution warranted on future colonoscopy in light of age, cardiac condition, challenging anatomy.    DILATION AND CURETTAGE OF UTERUS     pt states this was in the 1970's   ESOPHAGOGASTRODUODENOSCOPY (EGD) WITH PROPOFOL  N/A 05/25/2020   Grade 1 esophageal varices, single mucosal nodule in stomach s/p biopsy. (hyperplastic)>    LEFT HEART CATH  09/10/2008   normal coronary arteries, normal LV systolic function, EF 65% (Dr. HILARIO Jester)   LYMPH NODE DISSECTION Right 1997   under arm   NM MYOCAR PERF WALL MOTION  2010   dipyridamole - mild-mod in intenstiy perfusion defect in mid anterior, mid anteroseptal wall, EF 70%   OVARY SURGERY     bilateral tumors removed   POLYPECTOMY  11/20/2019   Procedure: POLYPECTOMY;  Surgeon: Shaaron Lamar HERO, MD;  Location: [facility];  Service: Endoscopy;;  hot and cold snare cecal polyp, and asending polyps x 2   RIGHT/LEFT HEART CATH AND CORONARY ANGIOGRAPHY N/A 10/02/2020   Procedure: RIGHT/LEFT HEART CATH AND CORONARY ANGIOGRAPHY;  Surgeon: [place], Peter M, MD;  Location: [facility];  Service: Cardiovascular;  Laterality: N/A;   THYROIDECTOMY     THYROIDECTOMY  02/2008   TRANSCATHETER AORTIC VALVE REPLACEMENT, TRANSFEMORAL N/A 03/23/2021   Procedure:  TRANSCATHETER AORTIC VALVE REPLACEMENT, TRANSFEMORAL;  Surgeon: Verlin Lonni BIRCH, MD;  Location: [facility];  Service: Open Heart Surgery;  Laterality: N/A;   TRANSTHORACIC ECHOCARDIOGRAM  08/2011   EF=>55%, mild conc LVH; trace MR; mild TR; mild-mod AV calcification with mild valvular AV stenosis   VAGINAL PROLAPSE REPAIR  04/26/2011   Procedure: VAGINAL VAULT SUSPENSION;  Surgeon: Glendia DELENA Elizabeth, MD;  Location: [facility];  Service: Urology;  Laterality: N/A;  with Graft  10x6    FAMHx:  Family History  Problem Relation Age of Onset   Stomach cancer Mother 28   Heart disease Mother 22       heart disease   Heart disease Father 31       MI   Stroke Maternal Grandfather    Heart attack Paternal Grandfather    Hypertension Brother    Bone cancer Brother    Hypertension Sister    Liver cancer Sister    Hypertension Sister    Hypertension Child    Colon cancer Neg Hx    Colon polyps Neg Hx    Pancreatic cancer Neg Hx    Esophageal cancer Neg Hx    Rectal cancer Neg Hx     SOCHx:   reports that she has never smoked. She has never used smokeless tobacco. She reports that she does not currently use alcohol. She reports that she does not use drugs.  ALLERGIES:  Allergies  Allergen Reactions   Inspra [Eplerenone] Itching   Senokot Wheat Bran [Wheat]     ABDOMINAL CRAMPS   Spironolactone      Stomach problems, vision changes     ROS: Pertinent items noted in HPI and remainder of comprehensive ROS otherwise negative.  HOME MEDS: Current Outpatient Medications  Medication Sig Dispense Refill   amLODipine  (NORVASC ) 10 MG tablet TAKE 1 TABLET(10 MG) BY MOUTH EVERY MORNING 90 tablet 3   B-D ULTRAFINE III SHORT PEN 31G X 8 MM MISC USE WITH INSULIN  TWICE DAILY AS DIRECTED 200 each 0   benazepril  (LOTENSIN ) 40 MG tablet TAKE 1 TABLET(40 MG) BY MOUTH DAILY 90 tablet 1   Blood Glucose Monitoring Suppl (ACCU-CHEK GUIDE) w/Device KIT 1 each by Does not apply route 4  (four) times daily. 1 kit 0   carvedilol  (COREG ) 12.5 MG tablet TAKE 1 TABLET(12.5 MG) BY MOUTH TWICE DAILY WITH A MEAL 60 tablet 3   cholecalciferol (VITAMIN D3) 25 MCG (1000 UT) tablet Take 1,000 Units by mouth in the morning.     clopidogrel  (PLAVIX ) 75 MG tablet TAKE 1 TABLET(75 MG) BY MOUTH DAILY 90 tablet 3   Continuous Glucose Receiver (FREESTYLE LIBRE 3 READER) DEVI Use to test BG 4 + times daily. E11.65 1 each 0   ezetimibe  (ZETIA ) 10 MG tablet Take 1 tablet (10 mg total) by mouth daily. 90 tablet 3   glucose blood (ACCU-CHEK GUIDE TEST) test strip USE TO CHECK BLOOD SUGAR IN THE MORNING, AT NOON, IN THE EVENING AND AT BEDTIME 150 strip 2   HYDROcodone -acetaminophen  (NORCO/VICODIN) 5-325 MG tablet Take 2 tablets by mouth every 6 (six) hours as needed. 4 tablet 0   insulin  isophane & regular human KwikPen (HUMULIN  70/30 KWIKPEN) (70-30) 100 UNIT/ML KwikPen INJECT 50 UNITS WITH BREAKFAST AND 40 UNITS WITH SUPPER WHEN GLUCOSE READING ABOVE  90 30 mL 0   latanoprost  (XALATAN ) 0.005 % ophthalmic solution Place 1 drop into both eyes at bedtime.      lubiprostone  (AMITIZA ) 24 MCG capsule Take 1 capsule (24 mcg total) by mouth 2 (two) times daily with a meal. 60 capsule 3   metFORMIN  (GLUCOPHAGE ) 500 MG tablet TAKE 1 TABLET(500 MG) BY MOUTH DAILY WITH BREAKFAST 90 tablet 1   Multiple Vitamin (MULTIVITAMIN WITH MINERALS) TABS tablet Take 1 tablet by mouth daily.     Omega-3 Fatty Acids (FISH OIL) 1200 MG CAPS Take 1,200 mg by mouth every morning.     pantoprazole  (PROTONIX ) 40 MG tablet TAKE 1 TABLET BY MOUTH DAILY 30 MINUTES BEFORE BREAKFAST 90 tablet 0   potassium chloride  (KLOR-CON ) 10 MEQ tablet Take 1 tablet (10 mEq total) by mouth 2 (two) times daily. 6 tablet 0   pravastatin  (PRAVACHOL ) 80 MG tablet Take 1 tablet (80 mg total) by mouth every evening. 90 tablet 3   No current facility-administered medications for this visit.    LABS/IMAGING: No results found for this or any previous visit  (from the past 48 hours). No results found.  VITALS: BP 118/60 (BP Location: Left Arm, Patient Position: Sitting)   Pulse 64   Ht 5' 5 (1.651 m)   Wt 170 lb 12.8 oz (77.5 kg)   SpO2 99%   BMI 28.42 kg/m   EXAM: General appearance: alert and no distress Neck: no carotid bruit and no JVD Lungs: clear to auscultation bilaterally Heart: regular rate and rhythm, s1/s2, 3/6 diastolic murmur at the LLSB Abdomen: soft, non-tender; bowel sounds normal; no masses,  no organomegaly Extremities: extremities normal, atraumatic, no cyanosis or edema Pulses: 2+ and symmetric Skin: Skin color, texture, turgor normal. No rashes or lesions Neurologic: Grossly normal Psych: Mood, affect normal  EKG: EKG Interpretation Date/Time:  Friday November 10 2023 10:07:48 EDT Ventricular Rate:  64 PR Interval:  174 QRS Duration:  102 QT Interval:  442 QTC Calculation: 455 R Axis:   -50  Text Interpretation: Normal sinus rhythm Left anterior fascicular block Moderate voltage criteria for LVH, may be normal variant ( R in aVL , Cornell product ) When compared with ECG of 15-Feb-2023 10:34, No significant change was found Confirmed by Mona Kent [phone]) on 11/10/2023 10:13:52 AM    ASSESSMENT: Severe aortic stenosis status post 23 mm Edwards SAPIEN 3 transcatheter heart valve replacement (03/23/2021)-noted to have mild AI by echo in 2023 and mild to moderate perivalvular leak in 2024 Intermittent chest pain-cardiac CT with a CAC score of 620, 90th percentile with mild to moderate nonobstructive disease, FFR negative (07/2023) Hypertension Dyslipidemia  NAFLD GERD DM2 on insulin  History of medication non-compliance  PLAN: 1.   Joanna. Kronick is mostly concerned with fatigue.  She denies any significant shortness of breath.  She has had some intermittent chest pain was found to have moderate nonobstructive coronary disease by CT.  In general her chest pain symptoms seem to have improved.  I think her  fatigue is multifactorial and I advised her to follow-up with her PCP regarding that.  I will repeat an echo as it has been more than 6 months since her last study to see if there has been any stability or worsening of her Abran aortic valve leak.  Plan follow-up annually or sooner as necessary.  Kent KYM Mona, MD, [facility], FNLA, FACP  [place]  [facility]  Medical Director of the Advanced Lipid Disorders &  Cardiovascular Risk Reduction Clinic  Diplomate of the [employer] of Clinical Lipidology Attending Cardiologist  Direct Dial: [phone]  Fax: [phone]  Website:  [email or web address]   Vinie BROCKS Saleah Rishel 11/10/2023, 10:13 AM

## 2023-11-10 NOTE — Patient Instructions (Signed)
 Medication Instructions:  No changes *If you need a refill on your cardiac medications before your next appointment, please call your pharmacy*  Lab Work: None If you have labs (blood work) drawn today and your tests are completely normal, you will receive your results only by: MyChart Message (if you have MyChart) OR A paper copy in the mail If you have any lab test that is abnormal or we need to change your treatment, we will call you to review the results.  Testing/Procedures: Your physician has requested that you have an echocardiogram. Echocardiography is a painless test that uses sound waves to create images of your heart. It provides your doctor with information about the size and shape of your heart and how well your heart's chambers and valves are working. This procedure takes approximately one hour. There are no restrictions for this procedure. Please do NOT wear cologne, perfume, aftershave, or lotions (deodorant is allowed). Please arrive 15 minutes prior to your appointment time.  Please note: We ask at that you not bring children with you during ultrasound (echo/ vascular) testing. Due to room size and safety concerns, children are not allowed in the ultrasound rooms during exams. Our front office staff cannot provide observation of children in our lobby area while testing is being conducted. An adult accompanying a patient to their appointment will only be allowed in the ultrasound room at the discretion of the ultrasound technician under special circumstances. We apologize for any inconvenience.   Follow-Up: At [facility], you and your health needs are our priority.  As part of our continuing mission to provide you with exceptional heart care, our providers are all part of one team.  This team includes your primary Cardiologist (physician) and Advanced Practice Providers or APPs (Physician Assistants and Nurse Practitioners) who all work together to provide you with the  care you need, when you need it.  Your next appointment:   1 year(s)  Provider:   Vinie JAYSON Maxcy, MD    We recommend signing up for the patient portal called MyChart.  Sign up information is provided on this After Visit Summary.  MyChart is used to connect with patients for Virtual Visits (Telemedicine).  Patients are able to view lab/test results, encounter notes, upcoming appointments, etc.  Non-urgent messages can be sent to your provider as well.   To learn more about what you can do with MyChart, go to [email or web address].

## 2023-11-14 DIAGNOSIS — Z6828 Body mass index (BMI) 28.0-28.9, adult: Secondary | ICD-10-CM | POA: Diagnosis not present

## 2023-11-14 DIAGNOSIS — M47816 Spondylosis without myelopathy or radiculopathy, lumbar region: Secondary | ICD-10-CM | POA: Diagnosis not present

## 2023-11-22 ENCOUNTER — Ambulatory Visit (INDEPENDENT_AMBULATORY_CARE_PROVIDER_SITE_OTHER): Admitting: Family Medicine

## 2023-11-22 ENCOUNTER — Ambulatory Visit: Admitting: Urology

## 2023-11-22 ENCOUNTER — Encounter: Payer: Self-pay | Admitting: Family Medicine

## 2023-11-22 VITALS — BP 119/62 | HR 72 | Resp 18 | Ht 65.0 in | Wt 174.0 lb

## 2023-11-22 DIAGNOSIS — E663 Overweight: Secondary | ICD-10-CM

## 2023-11-22 DIAGNOSIS — E1122 Type 2 diabetes mellitus with diabetic chronic kidney disease: Secondary | ICD-10-CM

## 2023-11-22 DIAGNOSIS — N1831 Chronic kidney disease, stage 3a: Secondary | ICD-10-CM

## 2023-11-22 DIAGNOSIS — Z794 Long term (current) use of insulin: Secondary | ICD-10-CM

## 2023-11-22 DIAGNOSIS — N181 Chronic kidney disease, stage 1: Secondary | ICD-10-CM

## 2023-11-22 DIAGNOSIS — E1169 Type 2 diabetes mellitus with other specified complication: Secondary | ICD-10-CM | POA: Diagnosis not present

## 2023-11-22 DIAGNOSIS — E119 Type 2 diabetes mellitus without complications: Secondary | ICD-10-CM | POA: Diagnosis not present

## 2023-11-22 DIAGNOSIS — K7469 Other cirrhosis of liver: Secondary | ICD-10-CM

## 2023-11-22 DIAGNOSIS — E782 Mixed hyperlipidemia: Secondary | ICD-10-CM | POA: Diagnosis not present

## 2023-11-22 DIAGNOSIS — I10 Essential (primary) hypertension: Secondary | ICD-10-CM | POA: Diagnosis not present

## 2023-11-22 NOTE — Patient Instructions (Signed)
 F/U ion 4.5  months, call if you need me sooner  Happy 8 qnd many more good years!  Urine ACR today , bmp and EGFr  Fasting lipid, cmp and EGFR to be drawn 5 to 7 days before next appointment  Examine feet every day since sensation is poor  It is important that you exercise regularly at least 30 minutes 5 times a week. If you develop chest pain, have severe difficulty breathing, or feel very tired, stop exercising immediately and seek medical attention   Please fill and continue to take chlorthalidone  daily as prescribed  Thanks for choosing [facility], we consider it a privelige to serve you.

## 2023-11-23 ENCOUNTER — Encounter: Payer: Self-pay | Admitting: Family Medicine

## 2023-11-23 ENCOUNTER — Encounter: Payer: Self-pay | Admitting: "Endocrinology

## 2023-11-23 ENCOUNTER — Ambulatory Visit (INDEPENDENT_AMBULATORY_CARE_PROVIDER_SITE_OTHER): Admitting: "Endocrinology

## 2023-11-23 VITALS — BP 132/60 | HR 60 | Ht 65.0 in | Wt 176.6 lb

## 2023-11-23 DIAGNOSIS — E049 Nontoxic goiter, unspecified: Secondary | ICD-10-CM

## 2023-11-23 DIAGNOSIS — E782 Mixed hyperlipidemia: Secondary | ICD-10-CM

## 2023-11-23 DIAGNOSIS — Z794 Long term (current) use of insulin: Secondary | ICD-10-CM | POA: Diagnosis not present

## 2023-11-23 DIAGNOSIS — I10 Essential (primary) hypertension: Secondary | ICD-10-CM

## 2023-11-23 DIAGNOSIS — N181 Chronic kidney disease, stage 1: Secondary | ICD-10-CM

## 2023-11-23 DIAGNOSIS — E1122 Type 2 diabetes mellitus with diabetic chronic kidney disease: Secondary | ICD-10-CM | POA: Diagnosis not present

## 2023-11-23 LAB — BMP8+EGFR
BUN/Creatinine Ratio: 12 (ref 12–28)
BUN: 13 mg/dL (ref 8–27)
CO2: 26 mmol/L (ref 20–29)
Calcium: 9.8 mg/dL (ref 8.7–10.3)
Chloride: 93 mmol/L — ABNORMAL LOW (ref 96–106)
Creatinine, Ser: 1.05 mg/dL — ABNORMAL HIGH (ref 0.57–1.00)
Glucose: 264 mg/dL — ABNORMAL HIGH (ref 70–99)
Potassium: 3.4 mmol/L — ABNORMAL LOW (ref 3.5–5.2)
Sodium: 136 mmol/L (ref 134–144)
eGFR: 53 mL/min/1.73 — ABNORMAL LOW (ref >59)

## 2023-11-23 LAB — POCT GLYCOSYLATED HEMOGLOBIN (HGB A1C): HbA1c, POC (controlled diabetic range): 8.8 % — AB (ref 0.0–7.0)

## 2023-11-23 MED ORDER — HUMULIN 70/30 KWIKPEN (70-30) 100 UNIT/ML ~~LOC~~ SUPN: PEN_INJECTOR | SUBCUTANEOUS | 0 refills | Status: DC

## 2023-11-23 MED ORDER — TIRZEPATIDE 2.5 MG/0.5ML ~~LOC~~ SOAJ: 2.5000 mg | SUBCUTANEOUS | 0 refills | Status: DC

## 2023-11-23 NOTE — Assessment & Plan Note (Signed)
 Hyperlipidemia:Low fat diet discussed and encouraged.   Lipid Panel  Lab Results  Component Value Date   CHOL 139 10/06/2023   HDL 38 (L) 10/06/2023   LDLCALC 68 10/06/2023   TRIG 200 (H) 10/06/2023   CHOLHDL 4.0 05/26/2023     Needs to reduce fat in diet Updated lab needed at/ before next visit.

## 2023-11-23 NOTE — Progress Notes (Signed)
 Joanna Brown     MRN: [id number]      DOB: May 19, 1940  Chief Complaint  Patient presents with   Hypertension    6 week follow up     HPI Joanna Brown is here for follow up and re-evaluation of chronic medical conditions, medication management and review of any available recent lab and radiology data.  Preventive health is updated, specifically  Cancer screening and Immunization.   Questions or concerns regarding consultations or procedures which the PT has had in the interim are  addressed. The PT denies any adverse reactions to current medications since the last visit.  There are no new concerns.  There are no specific complaints   ROS Denies recent fever or chills. Denies sinus pressure, nasal congestion, ear pain or sore throat. Denies chest congestion, productive cough or wheezing. Denies chest pains, palpitations and leg swelling Denies abdominal pain, nausea, vomiting,diarrhea or constipation.   Denies dysuria, frequency, hesitancy or incontinence. C/o  joint pain, swelling and limitation in mobility. Denies headaches, seizures, numbness, or tingling. Denies depression, anxiety or insomnia. Denies skin break down or rash.   PE  BP 119/62   Pulse 72   Resp 18   Ht 5' 5 (1.651 m)   Wt 174 lb 0.6 oz (78.9 kg)   SpO2 96%   BMI 28.96 kg/m   Patient alert and oriented and in no cardiopulmonary distress.  HEENT: No facial asymmetry, EOMI,     Neck supple .  Chest: Clear to auscultation bilaterally.  CVS: S1, S2 systolic  murmurs, no S3.Regular rate.  ABD: Soft non tender.   Ext: No edema  MS: Adequate though reduced  ROM spine, shoulders, hips and knees.  Skin: Intact, no ulcerations or rash noted.  Psych: Good eye contact, normal affect. Memory intact not anxious or depressed appearing.  CNS: CN 2-12 intact, power,  normal throughout.no focal deficits noted.   Assessment & Plan  Hepatic cirrhosis (HCC) Being followed by GI  Mixed  hyperlipidemia Hyperlipidemia:Low fat diet discussed and encouraged.   Lipid Panel  Lab Results  Component Value Date   CHOL 139 10/06/2023   HDL 38 (L) 10/06/2023   LDLCALC 68 10/06/2023   TRIG 200 (H) 10/06/2023   CHOLHDL 4.0 05/26/2023     Needs to reduce fat in diet Updated lab needed at/ before next visit.   Essential hypertension Controlled, no change in medication DASH diet and commitment to daily physical activity for a minimum of 30 minutes discussed and encouraged, as a part of hypertension management. The importance of attaining a healthy weight is also discussed.     11/22/2023    9:34 AM 11/10/2023   10:03 AM 10/06/2023   10:12 AM 10/06/2023    9:49 AM 10/05/2023    9:24 AM 10/01/2023    6:21 PM 10/01/2023    6:01 PM  BP/Weight  Systolic BP 119 118 160 150 134 174 195  Diastolic BP 62 60 80 62 68 77 79  Wt. (Lbs) 174.04 170.8  174 171.5    BMI 28.96 kg/m2 28.42 kg/m2  28.96 kg/m2 28.54 kg/m2         CKD (chronic kidney disease) stage 3, GFR 30-59 ml/min (HCC) Stable and managed by Nephrology  Type 2 diabetes mellitus with stage 1 chronic kidney disease, with long-term current use of insulin  (HCC) Diabetes associated with hypertension, hyperlipidemia, CKD, and heart disease  Joanna Brown is reminded of the importance of commitment to daily physical activity for  30 minutes or more, as Brown and the need to limit carbohydrate intake to 30 to 60 grams per meal to help with blood sugar control.   The need to take medication as prescribed, test blood sugar as directed, and to call between visits if there is a concern that blood sugar is uncontrolled is also discussed.   Joanna Brown is reminded of the importance of daily foot exam, annual eye examination, and good blood sugar, blood pressure and cholesterol control.     Latest Ref Rng & Units 11/23/2023   10:34 AM 11/22/2023   10:26 AM 11/22/2023   10:19 AM 10/06/2023   10:46 AM 10/01/2023    4:12 PM  Diabetic Labs   HbA1c 0.0 - 7.0 % 8.8       Micro/Creat Ratio 0 - 29 mg/g creat   63     Chol 100 - 199 mg/dL    860    HDL >60 mg/dL    38    Calc LDL 0 - 99 mg/dL    68    Triglycerides 0 - 149 mg/dL    799    Creatinine 9.42 - 1.00 mg/dL  8.94   9.02  9.14       11/23/2023    9:21 AM 11/22/2023    9:34 AM 11/10/2023   10:03 AM 10/06/2023   10:12 AM 10/06/2023    9:49 AM 10/05/2023    9:24 AM 10/01/2023    6:21 PM  BP/Weight  Systolic BP 132 119 118 160 150 134 174  Diastolic BP 60 62 60 80 62 68 77  Wt. (Lbs) 176.6 174.04 170.8  174 171.5   BMI 29.39 kg/m2 28.96 kg/m2 28.42 kg/m2  28.96 kg/m2 28.54 kg/m2       Latest Ref Rng & Units 08/03/2021    9:20 AM 03/09/2021   12:00 AM  Foot/eye exam completion dates  Eye Exam No Retinopathy  No Retinopathy      Foot Form Completion  Done      This result is from an external source.  Managed by endo, currently uncontrolled      Overweight (BMI 25.0-29.9)  Patient re-educated about  the importance of commitment to a  minimum of 150 minutes of exercise per week as Brown.  The importance of healthy food choices with portion control discussed, as well as eating regularly and within a 12 hour window most days. The need to choose clean , green food 50 to 75% of the time is discussed, as well as to make water  the primary drink and set a goal of 64 ounces water  daily.       11/23/2023    9:21 AM 11/22/2023    9:34 AM 11/10/2023   10:03 AM  Weight /BMI  Weight 176 lb 9.6 oz 174 lb 0.6 oz 170 lb 12.8 oz  Height 5' 5 (1.651 m) 5' 5 (1.651 m) 5' 5 (1.651 m)  BMI 29.39 kg/m2 28.96 kg/m2 28.42 kg/m2

## 2023-11-23 NOTE — Progress Notes (Signed)
 11/23/2023                     Endocrinology follow-up note   Subjective:    Patient ID: Joanna Brown, female    DOB: 11/08/1940. She is being seen in follow-up for uncontrolled type 2 diabetes, complicated by CKD, hyperlipidemia, hypertension.     PMD:   Antonetta Rollene BRAVO, MD  Past Medical History:  Diagnosis Date   Anginal pain (HCC)    Arthritis    OA   Diabetes mellitus    Dyspnea    Female bladder prolapse    GERD (gastroesophageal reflux disease)    Glaucoma    Hyperlipidemia    Hypertension    echo and stress 4/10 reports on chart, EKG ` LOV 9/12 on chart   S/P TAVR (transcatheter aortic valve replacement) 03/23/2021   Edwards 23mm S3U TF approach with Dr. Verlin and Dr. Lucas   Severe aortic stenosis    Thyroid  disease    Past Surgical History:  Procedure Laterality Date   ABDOMINAL HYSTERECTOMY     ANTERIOR AND POSTERIOR REPAIR  04/26/2011   Procedure: ANTERIOR (CYSTOCELE) AND POSTERIOR REPAIR (RECTOCELE);  Surgeon: Glendia DELENA Elizabeth, MD;  Location: [facility];  Service: Urology;  Laterality: N/A;   BIOPSY  05/25/2020   Procedure: BIOPSY;  Surgeon: Legrand Victory LITTIE DOUGLAS, MD;  Location: [facility];  Service: Gastroenterology;;   CATARACT EXTRACTION Bilateral    with IOL   CHOLECYSTECTOMY  2009   COLONOSCOPY N/A 12/02/2013   three colon polyps removed, small internal hemorrhoids. Hyperplastic polyps   COLONOSCOPY N/A 11/20/2019   pancolonic diverticulosis, two 10-11 mm polyps in ascending colon, one 5 mm polyp in cecum, ascending colon with superficially invasive adenocarcinoma arising in background of sessile serrated polyps with low and high grade cytologic dysplasia.   COLONOSCOPY     COLONOSCOPY W/ POLYPECTOMY     COLONOSCOPY WITH PROPOFOL  N/A 05/25/2020   diverticulosis in right colon, redundant colon. Caution warranted on future colonoscopy in light of age, cardiac condition, challenging anatomy.    DILATION AND CURETTAGE OF UTERUS     pt states this  was in the 1970's   ESOPHAGOGASTRODUODENOSCOPY (EGD) WITH PROPOFOL  N/A 05/25/2020   Grade 1 esophageal varices, single mucosal nodule in stomach s/p biopsy. (hyperplastic)>    LEFT HEART CATH  09/10/2008   normal coronary arteries, normal LV systolic function, EF 65% (Dr. HILARIO Jester)   LYMPH NODE DISSECTION Right 1997   under arm   NM MYOCAR PERF WALL MOTION  2010   dipyridamole - mild-mod in intenstiy perfusion defect in mid anterior, mid anteroseptal wall, EF 70%   OVARY SURGERY     bilateral tumors removed   POLYPECTOMY  11/20/2019   Procedure: POLYPECTOMY;  Surgeon: Shaaron Lamar HERO, MD;  Location: [facility];  Service: Endoscopy;;  hot and cold snare cecal polyp, and asending polyps x 2   RIGHT/LEFT HEART CATH AND CORONARY ANGIOGRAPHY N/A 10/02/2020   Procedure: RIGHT/LEFT HEART CATH AND CORONARY ANGIOGRAPHY;  Surgeon: [place], Peter M, MD;  Location: [facility]VASIVE CV LAB;  Service: Cardiovascular;  Laterality: N/A;   THYROIDECTOMY     THYROIDECTOMY  02/2008   TRANSCATHETER AORTIC VALVE REPLACEMENT, TRANSFEMORAL N/A 03/23/2021   Procedure: TRANSCATHETER AORTIC VALVE REPLACEMENT, TRANSFEMORAL;  Surgeon: Verlin Lonni BIRCH, MD;  Location: [facility] INVASIVE CV LAB;  Service: Open Heart Surgery;  Laterality: N/A;   TRANSTHORACIC ECHOCARDIOGRAM  08/2011   EF=>55%, mild conc LVH; trace MR; mild  TR; mild-mod AV calcification with mild valvular AV stenosis   VAGINAL PROLAPSE REPAIR  04/26/2011   Procedure: VAGINAL VAULT SUSPENSION;  Surgeon: Glendia DELENA Elizabeth, MD;  Location: [facility];  Service: Urology;  Laterality: N/A;  with Graft  10x6   Social History   Socioeconomic History   Marital status: Married    Spouse name: [name]   Number of children: 1   Years of education: Trade   Highest education level: 12th grade  Occupational History   Occupation: Retired   Occupation: Insurance account manager  Tobacco Use   Smoking status: Never   Smokeless tobacco: Never  Vaping Use   Vaping status:  Never Used  Substance and Sexual Activity   Alcohol use: Not Currently   Drug use: No   Sexual activity: Yes  Other Topics Concern   Not on file  Social History Narrative   Patient lives at home with spouse. 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 LIVED IN [place] FOR 50 YRS AND CAME BACK TO [place] ~2009. Caffeine Use: 1 cup of coffee daily. HAD ONE CHILD: PASSED 5 YRS. HAVE THREE GRAND-KIDS AND TWO GREAT GRANDS.    Social Drivers of Health   Financial Resource Strain: Medium Risk (08/22/2023)   Overall Financial Resource Strain (CARDIA)    Difficulty of Paying Living Expenses: Somewhat hard  Food Insecurity: No Food Insecurity (08/22/2023)   Hunger Vital Sign    Worried About Running Out of Food in the Last Year: Never true    Ran Out of Food in the Last Year: Never true  Transportation Needs: No Transportation Needs (08/22/2023)   PRAPARE - Administrator, Civil Service (Medical): No    Lack of Transportation (Non-Medical): No  Physical Activity: Sufficiently Active (08/22/2023)   Exercise Vital Sign    Days of Exercise per Week: 7 days    Minutes of Exercise per Session: 30 min  Stress: No Stress Concern Present (08/22/2023)   [employer] of Occupational Health - Occupational Stress Questionnaire    Feeling of Stress : Not at all  Social Connections: Socially Integrated (08/22/2023)   Social Connection and Isolation Panel    Frequency of Communication with Friends and Family: More than three times a week    Frequency of Social Gatherings with Friends and Family: Once a week    Attends Religious Services: More than 4 times per year    Active Member of [employer] or Organizations: Yes    Attends Banker Meetings: 1 to 4 times per year    Marital Status: Married   Outpatient Encounter Medications as of 11/23/2023  Medication Sig   tirzepatide  (MOUNJARO ) 2.5 MG/0.5ML Pen Inject 2.5 mg into the skin once a week.   [DISCONTINUED] insulin  isophane  & regular human KwikPen (HUMULIN  70/30 KWIKPEN) (70-30) 100 UNIT/ML KwikPen INJECT 50 UNITS WITH BREAKFAST AND 40 UNITS WITH SUPPER WHEN GLUCOSE READING ABOVE 90 (Patient taking differently: INJECT 40 UNITS WITH BREAKFAST AND 50 UNITS WITH SUPPER WHEN GLUCOSE READING ABOVE 90)   amLODipine  (NORVASC ) 10 MG tablet TAKE 1 TABLET(10 MG) BY MOUTH EVERY MORNING   B-D ULTRAFINE III SHORT PEN 31G X 8 MM MISC USE WITH INSULIN  TWICE DAILY AS DIRECTED   benazepril  (LOTENSIN ) 40 MG tablet TAKE 1 TABLET(40 MG) BY MOUTH DAILY   Blood Glucose Monitoring Suppl (ACCU-CHEK GUIDE) w/Device KIT 1 each by Does not apply route 4 (four) times daily.   carvedilol  (COREG ) 12.5 MG tablet TAKE 1 TABLET(12.5  MG) BY MOUTH TWICE DAILY WITH A MEAL   chlorthalidone  (HYGROTON ) 25 MG tablet Take 1 tablet (25 mg total) by mouth daily.   cholecalciferol (VITAMIN D3) 25 MCG (1000 UT) tablet Take 1,000 Units by mouth in the morning.   clopidogrel  (PLAVIX ) 75 MG tablet TAKE 1 TABLET(75 MG) BY MOUTH DAILY   Continuous Glucose Receiver (FREESTYLE LIBRE 3 READER) DEVI Use to test BG 4 + times daily. E11.65   ezetimibe  (ZETIA ) 10 MG tablet Take 1 tablet (10 mg total) by mouth daily.   glucose blood (ACCU-CHEK GUIDE TEST) test strip USE TO CHECK BLOOD SUGAR IN THE MORNING, AT NOON, IN THE EVENING AND AT BEDTIME   HYDROcodone -acetaminophen  (NORCO/VICODIN) 5-325 MG tablet Take 2 tablets by mouth every 6 (six) hours as needed.   insulin  isophane & regular human KwikPen (HUMULIN  70/30 KWIKPEN) (70-30) 100 UNIT/ML KwikPen INJECT 50 UNITS WITH BREAKFAST AND 40 UNITS WITH SUPPER WHEN GLUCOSE READING ABOVE 90   latanoprost  (XALATAN ) 0.005 % ophthalmic solution Place 1 drop into both eyes at bedtime.    lubiprostone  (AMITIZA ) 24 MCG capsule Take 1 capsule (24 mcg total) by mouth 2 (two) times daily with a meal.   metFORMIN  (GLUCOPHAGE ) 500 MG tablet TAKE 1 TABLET(500 MG) BY MOUTH DAILY WITH BREAKFAST   Multiple Vitamin (MULTIVITAMIN WITH MINERALS)  TABS tablet Take 1 tablet by mouth daily.   Omega-3 Fatty Acids (FISH OIL) 1200 MG CAPS Take 1,200 mg by mouth every morning.   pantoprazole  (PROTONIX ) 40 MG tablet TAKE 1 TABLET BY MOUTH DAILY 30 MINUTES BEFORE BREAKFAST   potassium chloride  (KLOR-CON ) 10 MEQ tablet Take 1 tablet (10 mEq total) by mouth 2 (two) times daily.   pravastatin  (PRAVACHOL ) 80 MG tablet Take 1 tablet (80 mg total) by mouth every evening.   No facility-administered encounter medications on file as of 11/23/2023.   ALLERGIES: Allergies  Allergen Reactions   Inspra [Eplerenone] Itching   Senokot Wheat Bran [Wheat]     ABDOMINAL CRAMPS   Spironolactone      Stomach problems, vision changes    VACCINATION STATUS: Immunization History  Administered Date(s) Administered   Hepatitis B, ADULT 09/27/2021   MODERNA COVID-19 SARS-COV-2 PEDS BIVALENT BOOSTER 44yr-38yr 01/09/2023   Moderna SARS-COV2 Booster Vaccination 11/19/2020   Moderna Sars-Covid-2 Vaccination 09/13/2019, 10/12/2019, 04/29/2020   Pneumococcal Conjugate-13 12/11/2013   Pneumococcal Polysaccharide-23 01/13/2010   Tdap 10/05/2010, 08/23/2022    Diabetes She presents for her follow-up diabetic visit. She has type 2 diabetes mellitus. Onset time: She was diagnosed at approximate age of 41 years. Her disease course has been worsening. There are no hypoglycemic associated symptoms. Pertinent negatives for hypoglycemia include no confusion, headaches, pallor or seizures. Pertinent negatives for diabetes include no blurred vision, no chest pain, no fatigue, no polydipsia, no polyphagia and no polyuria. There are no hypoglycemic complications. Symptoms are worsening. Diabetic complications include nephropathy and retinopathy. Risk factors for coronary artery disease include dyslipidemia, diabetes mellitus, obesity and sedentary lifestyle. Her weight is increasing steadily. She is following a generally unhealthy diet. When asked about meal planning, she reported  none. She has had a previous visit with a dietitian. She rarely participates in exercise. Her home blood glucose trend is increasing steadily. Her breakfast blood glucose range is generally >200 mg/dl. Her lunch blood glucose range is generally >200 mg/dl. Her dinner blood glucose range is generally >200 mg/dl. Her bedtime blood glucose range is generally >200 mg/dl. Her overall blood glucose range is >200 mg/dl. (Ms. Tilmon presents with worsening  glycemic profile.  Her freestyle libre AGP report shows 29% time in range, 45% level 1 hyperglycemia, 26% level 2 hyperglycemia.  Her average blood glucose is 208.  She has no hypoglycemia.  Her point-of-care A1c is 8.8% increasing from 8.4% during her last visit.   ) Eye exam is current.  Hyperlipidemia This is a chronic problem. The current episode started more than 1 year ago. The problem is uncontrolled. Exacerbating diseases include diabetes and obesity. Pertinent negatives include no chest pain, myalgias or shortness of breath. Current antihyperlipidemic treatment includes statins. Risk factors for coronary artery disease include dyslipidemia, diabetes mellitus, hypertension, obesity, a sedentary lifestyle and post-menopausal.  Hypertension This is a chronic problem. The current episode started more than 1 year ago. Pertinent negatives include no blurred vision, chest pain, headaches, palpitations or shortness of breath. Risk factors for coronary artery disease include dyslipidemia, diabetes mellitus, obesity and sedentary lifestyle. Hypertensive end-organ damage includes retinopathy.   Nodular goiter She Patient is being seen today for a new issue with her thyroid .  She was found to have 2.6 cm nodule on right lobe of her thyroid  while undergoing CT scan of the chest during cancer surveillance.  She has previous history of left hemithyroidectomy approximately 10 years ago for multinodular goiter.  She is not on thyroid  hormone supplement or replacement.   She denies dysphagia, shortness of breath, nor voice change. -Her subsequent thyroid  ultrasound confirms multinodular right lobe of the thyroid  with surgically absent left lobe.  Her right thyroid  lobe nodule did not meet criteria for biopsy.  Review of systems  Constitutional: + Minimally fluctuating body weight, current  Body mass index is 29.39 kg/m. , no fatigue, no subjective hyperthermia, no subjective hypothermia    Objective:    BP 132/60   Pulse 60   Ht 5' 5 (1.651 m)   Wt 176 lb 9.6 oz (80.1 kg)   BMI 29.39 kg/m   Wt Readings from Last 3 Encounters:  11/23/23 176 lb 9.6 oz (80.1 kg)  11/22/23 174 lb 0.6 oz (78.9 kg)  11/10/23 170 lb 12.8 oz (77.5 kg)    Physical Exam- Limited  Constitutional:  Body mass index is 29.39 kg/m. , not in acute distress, normal state of mind    CMP ( most recent) CMP     Component Value Date/Time   NA 136 11/22/2023 1026   K 3.4 (L) 11/22/2023 1026   CL 93 (L) 11/22/2023 1026   CO2 26 11/22/2023 1026   GLUCOSE 264 (H) 11/22/2023 1026   GLUCOSE 134 (H) 10/01/2023 1612   BUN 13 11/22/2023 1026   CREATININE 1.05 (H) 11/22/2023 1026   CREATININE 1.11 (H) 03/02/2020 0848   CALCIUM  9.8 11/22/2023 1026   PROT 7.2 10/06/2023 1046   ALBUMIN 4.4 10/06/2023 1046   AST 16 10/06/2023 1046   ALT 12 10/06/2023 1046   ALKPHOS 51 10/06/2023 1046   BILITOT 0.3 10/06/2023 1046   GFRNONAA >60 10/01/2023 1612   GFRNONAA 57 (L) 07/30/2019 0718   GFRAA 66 07/30/2019 0718    Diabetic Labs (most recent): Lab Results  Component Value Date   HGBA1C 8.4 (A) 07/21/2023   HGBA1C 7.7 (A) 03/23/2023   HGBA1C 6.8 11/10/2022   MICROALBUR 6.4 07/30/2019   MICROALBUR 11.5 06/28/2018   MICROALBUR 10.3 (H) 07/18/2017    Lipid Panel     Component Value Date/Time   CHOL 139 10/06/2023 1046   TRIG 200 (H) 10/06/2023 1046   HDL 38 (L) 10/06/2023 1046  CHOLHDL 4.0 05/26/2023 0806   CHOLHDL 4.2 12/29/2021 0443   VLDL 49 (H) 12/29/2021 0443    LDLCALC 68 10/06/2023 1046   LDLCALC 99 07/30/2019 0718   Incidental finding on chest CT on December 05, 2019 Enlarged  multinodular remnant right thyroid  with dominant 2.6 cm hypodense Nodule.   Thyroid  ultrasound on December 19, 2019: Right lobe 4.4 cm, left lobe absent surgically. No adenopathy   IMPRESSION: Surgical changes of left hemithyroidectomy.  Multinodular thyroid .  3.9 cm and 1.6 cm cystic/almost completely cystic nodules with smooth margins, No thyroid  nodule meets criteria for biopsy or surveillance, as designated by the newly established ACR TI-RADS criteria.   Assessment & Plan:    1. Type 2 diabetes mellitus with stage 1 chronic kidney disease, with long-term current use of insulin  (HCC).  Ms. Pio presents with worsening glycemic profile.  Her freestyle libre AGP report shows 29% time in range, 45% level 1 hyperglycemia, 26% level 2 hyperglycemia.  Her average blood glucose is 208.  She has no hypoglycemia.  Her point-of-care A1c is 8.8% increasing from 8.4% during her last visit.     Recent labs reviewed, showing improving renal function.     Her diabetes is complicated by CKD obesity/sedentary life and patient remains at a high risk for more acute and chronic complications of diabetes which include CAD, CVA, CKD, retinopathy, and neuropathy. These are all discussed in detail with the patient.  - I have counseled the patient on diet management and weight loss, by adopting a carbohydrate restricted/protein rich diet.  -She still admits to dietary indiscretions including consumption of sweets and sweetened beverages.  - she acknowledges that there is a room for improvement in her food and drink choices. - Suggestion is made for her to avoid simple carbohydrates  from her diet including Cakes, Sweet Desserts, Ice Cream, Soda (diet and regular), Sweet Tea, Candies, Chips, Cookies, Store Bought Juices, Alcohol , Artificial Sweeteners,  Coffee Creamer, and Sugar-free  Products, Lemonade. This will help patient to have more stable blood glucose profile and potentially avoid unintended weight gain.  The following Lifestyle Medicine recommendations according to American College of Lifestyle Medicine  [facility]) were discussed and and offered to patient and she  agrees to start the journey:  A. Whole Foods, Plant-Based Nutrition comprising of fruits and vegetables, plant-based proteins, whole-grain carbohydrates was discussed in detail with the patient.   A list for source of those nutrients were also provided to the patient.  Patient will use only water  or unsweetened tea for hydration. B.  The need to stay away from risky substances including alcohol, smoking; obtaining 7 to 9 hours of restorative sleep, at least 150 minutes of moderate intensity exercise weekly, the importance of healthy social connections,  and stress management techniques were discussed. C.  A full color page of  Calorie density of various food groups per pound showing examples of each food groups was provided to the patient.   - I encouraged the patient to switch to  unprocessed or minimally processed complex starch and increased protein intake (animal or plant source), fruits, and vegetables.  - Patient is advised to stick to a routine mealtimes to eat 3 meals  a day and avoid unnecessary snacks ( to snack only to correct hypoglycemia).   - I have approached patient with the following individualized plan to manage diabetes and patient agrees:     - She has not followed with her insulin  instruction correctly, took 40 units with  breakfast and 50 units with supper.   - Based on her glycemic profile, she is going to need multiple daily injections of insulin  in order for her to achieve control of diabetes to target.  She would not be able to execute basal/bolus insulin  regimen.  - Accordingly, she is advised to increase her Humulin  70/30 to 50 units with breakfast and 40 units with supper when  Premeal blood glucose readings are above 90 mg per DL.    -She is benefiting from her CGM, advised to utilize continuously.  -Patient is encouraged to call clinic for blood glucose levels less than 70 or above 200 mg /dl. -She did not tolerate Farxiga which caused frequent yeast infections.  She discontinued this medication by her own.   She will continue metformin  500 mg p.o. daily at breakfast.   - She is open for additional intervention with GLP-1 receptor agonist.  I discussed and prescribed Mounjaro  2.5 mg subcutaneous weekly.  Side effects and precautions discussed with her.  This medication will be advanced as she tolerates.  - Patient specific target  A1c;  LDL, HDL, Triglycerides, were discussed in detail.  2.  Nodular goiter: Patient with remote past history of left hemithyroidectomy for multinodular goiter.  She did not require thyroid  hormone supplement.  She was incidentally found to have 2.6 cm nodule in the right lobe. -Her subsequent dedicated thyroid  ultrasound confirmed 2 nodules on the right lobe of her thyroid  which did not meet any criteria for biopsy. -She will not need any antithyroid intervention at this time.  Her recent thyroid  function test were consistent with euthyroid presentation.  She will be considered for thyroid  function test along with her subsequent labs.   3) BP/HTN:  Her blood pressure is controlled to target.   She is advised to continue her current blood pressure medications including benazepril  40 mg p.o. daily at breakfast.    4) Lipids/HPL: Her recent lipid panel showed improved LDL to 68.  She is advised to continue  pravastatin  80 mg nightly.    Side effects and precautions discussed with her.    Whole food plant-based diet was discussed with her.       5)  Weight/Diet: Her BMI is 29.39 --she is a candidate for moderate weight loss.  CDE Consult has been initiated , exercise, and detailed carbohydrates information provided.  6) Chronic  Care/Health Maintenance:  -Patient is on ACEI/ARB and Statin medications and encouraged to continue to follow up with Ophthalmology, Podiatrist at least yearly or according to recommendations, and advised to  stay away from smoking. I have recommended yearly flu vaccine and pneumonia vaccination at least every 5 years; moderate intensity exercise for up to 150 minutes weekly; and  sleep for at least 7 hours a day.   POC ABI for PAD screen was normal on June 10, 2020.  This test will be repeated in January 2027, or sooner if needed.     - I advised patient to maintain close follow up with Antonetta Rollene BRAVO, MD for primary care needs.   I spent  41  minutes in the care of the patient today including review of labs from CMP, Lipids, Thyroid  Function, Hematology (current and previous including abstractions from other facilities); face-to-face time discussing  her blood glucose readings/logs, discussing hypoglycemia and hyperglycemia episodes and symptoms, medications doses, her options of short and long term treatment based on the latest standards of care / guidelines;  discussion about incorporating lifestyle medicine;  and documenting  the encounter. Risk reduction counseling performed per USPSTF guidelines to reduce cardiovascular risk factors.     Please refer to Patient Instructions for Blood Glucose Monitoring and Insulin /Medications Dosing Guide  in media tab for additional information. Please  also refer to  Patient Self Inventory in the Media  tab for reviewed elements of pertinent patient history.  Pat DEL Folta participated in the discussions, expressed understanding, and voiced agreement with the above plans.  All questions were answered to her satisfaction. she is encouraged to contact clinic should she have any questions or concerns prior to her return visit.   Follow up plan: - Return in about 4 months (around 03/25/2024) for Bring Meter/CGM Device/Logs- A1c in  Office.  Ranny Earl, MD Phone: [phone]  Fax: [phone]   This note was partially dictated with voice recognition software. Similar sounding words can be transcribed inadequately or may not  be corrected upon review.  11/23/2023, 10:06 AM

## 2023-11-23 NOTE — Assessment & Plan Note (Signed)
Stable and managed by Nephrology ?

## 2023-11-23 NOTE — Assessment & Plan Note (Signed)
 Controlled, no change in medication DASH diet and commitment to daily physical activity for a minimum of 30 minutes discussed and encouraged, as a part of hypertension management. The importance of attaining a healthy weight is also discussed.     11/22/2023    9:34 AM 11/10/2023   10:03 AM 10/06/2023   10:12 AM 10/06/2023    9:49 AM 10/05/2023    9:24 AM 10/01/2023    6:21 PM 10/01/2023    6:01 PM  BP/Weight  Systolic BP 119 118 160 150 134 174 195  Diastolic BP 62 60 80 62 68 77 79  Wt. (Lbs) 174.04 170.8  174 171.5    BMI 28.96 kg/m2 28.42 kg/m2  28.96 kg/m2 28.54 kg/m2

## 2023-11-23 NOTE — Assessment & Plan Note (Signed)
Being followed by GI 

## 2023-11-23 NOTE — Patient Instructions (Signed)

## 2023-11-23 NOTE — Addendum Note (Signed)
 Addended by: CLAUDENE ZADA POUR on: 11/23/2023 10:34 AM   Modules accepted: Orders

## 2023-11-24 LAB — MICROALBUMIN / CREATININE URINE RATIO
Creatinine, Urine: 57.4 mg/dL
Microalb/Creat Ratio: 63 mg/g{creat} — ABNORMAL HIGH (ref 0–29)
Microalbumin, Urine: 36.1 ug/mL

## 2023-11-25 ENCOUNTER — Ambulatory Visit: Payer: Self-pay | Admitting: Family Medicine

## 2023-11-25 NOTE — Assessment & Plan Note (Signed)
  Patient re-educated about  the importance of commitment to a  minimum of 150 minutes of exercise per week as able.  The importance of healthy food choices with portion control discussed, as well as eating regularly and within a 12 hour window most days. The need to choose clean , green food 50 to 75% of the time is discussed, as well as to make water  the primary drink and set a goal of 64 ounces water  daily.       11/23/2023    9:21 AM 11/22/2023    9:34 AM 11/10/2023   10:03 AM  Weight /BMI  Weight 176 lb 9.6 oz 174 lb 0.6 oz 170 lb 12.8 oz  Height 5' 5 (1.651 m) 5' 5 (1.651 m) 5' 5 (1.651 m)  BMI 29.39 kg/m2 28.96 kg/m2 28.42 kg/m2

## 2023-11-25 NOTE — Assessment & Plan Note (Signed)
 Diabetes associated with hypertension, hyperlipidemia, CKD, and heart disease  Joanna Brown is reminded of the importance of commitment to daily physical activity for 30 minutes or more, as able and the need to limit carbohydrate intake to 30 to 60 grams per meal to help with blood sugar control.   The need to take medication as prescribed, test blood sugar as directed, and to call between visits if there is a concern that blood sugar is uncontrolled is also discussed.   Joanna Brown is reminded of the importance of daily foot exam, annual eye examination, and good blood sugar, blood pressure and cholesterol control.     Latest Ref Rng & Units 11/23/2023   10:34 AM 11/22/2023   10:26 AM 11/22/2023   10:19 AM 10/06/2023   10:46 AM 10/01/2023    4:12 PM  Diabetic Labs  HbA1c 0.0 - 7.0 % 8.8       Micro/Creat Ratio 0 - 29 mg/g creat   63     Chol 100 - 199 mg/dL    860    HDL >60 mg/dL    38    Calc LDL 0 - 99 mg/dL    68    Triglycerides 0 - 149 mg/dL    799    Creatinine 9.42 - 1.00 mg/dL  8.94   9.02  9.14       11/23/2023    9:21 AM 11/22/2023    9:34 AM 11/10/2023   10:03 AM 10/06/2023   10:12 AM 10/06/2023    9:49 AM 10/05/2023    9:24 AM 10/01/2023    6:21 PM  BP/Weight  Systolic BP 132 119 118 160 150 134 174  Diastolic BP 60 62 60 80 62 68 77  Wt. (Lbs) 176.6 174.04 170.8  174 171.5   BMI 29.39 kg/m2 28.96 kg/m2 28.42 kg/m2  28.96 kg/m2 28.54 kg/m2       Latest Ref Rng & Units 08/03/2021    9:20 AM 03/09/2021   12:00 AM  Foot/eye exam completion dates  Eye Exam No Retinopathy  No Retinopathy      Foot Form Completion  Done      This result is from an external source.  Managed by endo, currently uncontrolled

## 2023-12-05 ENCOUNTER — Other Ambulatory Visit: Payer: Self-pay | Admitting: "Endocrinology

## 2023-12-05 DIAGNOSIS — E1122 Type 2 diabetes mellitus with diabetic chronic kidney disease: Secondary | ICD-10-CM

## 2023-12-09 ENCOUNTER — Other Ambulatory Visit: Payer: Self-pay | Admitting: "Endocrinology

## 2023-12-11 ENCOUNTER — Other Ambulatory Visit: Payer: Self-pay | Admitting: Gastroenterology

## 2023-12-23 ENCOUNTER — Other Ambulatory Visit: Payer: Self-pay | Admitting: Family Medicine

## 2023-12-27 ENCOUNTER — Ambulatory Visit (HOSPITAL_COMMUNITY)
Admission: RE | Admit: 2023-12-27 | Discharge: 2023-12-27 | Disposition: A | Discharge status: Home / Self Care | Source: Ambulatory Visit | Attending: Cardiology | Admitting: Cardiology

## 2023-12-27 DIAGNOSIS — Z952 Presence of prosthetic heart valve: Secondary | ICD-10-CM

## 2023-12-27 LAB — ECHOCARDIOGRAM COMPLETE
AV Mean grad: 13.6 mmHg
AV Peak grad: 23.2 mmHg
Ao pk vel: 2.41 m/s
Area-P 1/2: 2.09 cm2
P 1/2 time: 662 ms
S' Lateral: 3.5 cm

## 2023-12-28 ENCOUNTER — Ambulatory Visit: Payer: Self-pay | Admitting: Internal Medicine

## 2024-01-03 DIAGNOSIS — K219 Gastro-esophageal reflux disease without esophagitis: Secondary | ICD-10-CM | POA: Diagnosis not present

## 2024-01-03 DIAGNOSIS — K589 Irritable bowel syndrome without diarrhea: Secondary | ICD-10-CM | POA: Diagnosis not present

## 2024-01-03 DIAGNOSIS — I10 Essential (primary) hypertension: Secondary | ICD-10-CM | POA: Diagnosis not present

## 2024-01-03 DIAGNOSIS — K7460 Unspecified cirrhosis of liver: Secondary | ICD-10-CM | POA: Diagnosis not present

## 2024-01-03 DIAGNOSIS — E782 Mixed hyperlipidemia: Secondary | ICD-10-CM | POA: Diagnosis not present

## 2024-01-03 DIAGNOSIS — N1831 Chronic kidney disease, stage 3a: Secondary | ICD-10-CM | POA: Diagnosis not present

## 2024-01-04 ENCOUNTER — Other Ambulatory Visit (HOSPITAL_COMMUNITY): Payer: Self-pay | Admitting: Family Medicine

## 2024-01-04 DIAGNOSIS — Z1231 Encounter for screening mammogram for malignant neoplasm of breast: Secondary | ICD-10-CM

## 2024-01-04 LAB — COMPREHENSIVE METABOLIC PANEL WITH GFR
ALT: 14 IU/L (ref 0–32)
AST: 18 IU/L (ref 0–40)
Albumin: 4.4 g/dL (ref 3.7–4.7)
Alkaline Phosphatase: 50 IU/L (ref 44–121)
BUN/Creatinine Ratio: 13 (ref 12–28)
BUN: 14 mg/dL (ref 8–27)
Bilirubin Total: 0.4 mg/dL (ref 0.0–1.2)
CO2: 28 mmol/L (ref 20–29)
Calcium: 9.7 mg/dL (ref 8.7–10.3)
Chloride: 92 mmol/L — ABNORMAL LOW (ref 96–106)
Creatinine, Ser: 1.04 mg/dL — ABNORMAL HIGH (ref 0.57–1.00)
Globulin, Total: 2.7 g/dL (ref 1.5–4.5)
Glucose: 165 mg/dL — ABNORMAL HIGH (ref 70–99)
Potassium: 3.6 mmol/L (ref 3.5–5.2)
Sodium: 135 mmol/L (ref 134–144)
Total Protein: 7.1 g/dL (ref 6.0–8.5)
eGFR: 54 mL/min/1.73 — ABNORMAL LOW (ref >59)

## 2024-01-04 LAB — CBC WITH DIFFERENTIAL/PLATELET
Basophils Absolute: 0 x10E3/uL (ref 0.0–0.2)
Basos: 1 %
EOS (ABSOLUTE): 0.2 x10E3/uL (ref 0.0–0.4)
Eos: 3 %
Hematocrit: 39.6 % (ref 34.0–46.6)
Hemoglobin: 13.2 g/dL (ref 11.1–15.9)
Immature Grans (Abs): 0 x10E3/uL (ref 0.0–0.1)
Immature Granulocytes: 0 %
Lymphocytes Absolute: 1.6 x10E3/uL (ref 0.7–3.1)
Lymphs: 29 %
MCH: 28.9 pg (ref 26.6–33.0)
MCHC: 33.3 g/dL (ref 31.5–35.7)
MCV: 87 fL (ref 79–97)
Monocytes Absolute: 0.4 x10E3/uL (ref 0.1–0.9)
Monocytes: 6 %
Neutrophils Absolute: 3.4 x10E3/uL (ref 1.4–7.0)
Neutrophils: 61 %
Platelets: 170 x10E3/uL (ref 150–450)
RBC: 4.57 x10E6/uL (ref 3.77–5.28)
RDW: 13.1 % (ref 11.7–15.4)
WBC: 5.7 x10E3/uL (ref 3.4–10.8)

## 2024-01-04 LAB — AFP TUMOR MARKER: AFP, Serum, Tumor Marker: 3.3 ng/mL (ref 0.0–8.7)

## 2024-01-04 LAB — PROTIME-INR
INR: 1 (ref 0.9–1.2)
Prothrombin Time: 11.3 s (ref 9.1–12.0)

## 2024-01-05 ENCOUNTER — Other Ambulatory Visit: Payer: Self-pay | Admitting: Internal Medicine

## 2024-01-09 ENCOUNTER — Ambulatory Visit: Admitting: Podiatry

## 2024-01-09 ENCOUNTER — Encounter: Payer: Self-pay | Admitting: Podiatry

## 2024-01-09 DIAGNOSIS — E0822 Diabetes mellitus due to underlying condition with diabetic chronic kidney disease: Secondary | ICD-10-CM | POA: Diagnosis not present

## 2024-01-09 DIAGNOSIS — N183 Chronic kidney disease, stage 3 unspecified: Secondary | ICD-10-CM

## 2024-01-09 DIAGNOSIS — B351 Tinea unguium: Secondary | ICD-10-CM

## 2024-01-09 DIAGNOSIS — M79675 Pain in left toe(s): Secondary | ICD-10-CM

## 2024-01-09 DIAGNOSIS — M79674 Pain in right toe(s): Secondary | ICD-10-CM

## 2024-01-09 DIAGNOSIS — Z794 Long term (current) use of insulin: Secondary | ICD-10-CM

## 2024-01-12 ENCOUNTER — Encounter: Payer: Self-pay | Admitting: Podiatry

## 2024-01-12 ENCOUNTER — Telehealth: Payer: Self-pay

## 2024-01-12 NOTE — Progress Notes (Signed)
  Subjective:  Patient ID: Joanna Brown, female    DOB: 08/11/1940,  MRN: [id number]  CAPTOLA TESCHNER presents to clinic today for at risk foot care. Pt has h/o NIDDM with chronic kidney disease and painful thick toenails that are difficult to trim. Pain interferes with ambulation. Aggravating factors include wearing enclosed shoe gear. Pain is relieved with periodic professional debridement.  Chief Complaint  Patient presents with   [facility]    Rm17 Diabetic foot care/ Dr. Rollene Pesa last visit July 9,2025/ A1c 8.8    New problem(s): None.   PCP is Pesa Rollene BRAVO, MD.  Allergies  Allergen Reactions   Inspra [Eplerenone] Itching   Senokot Wheat Bran [Wheat]     ABDOMINAL CRAMPS   Spironolactone      Stomach problems, vision changes     Review of Systems: Negative except as noted in the HPI.  Objective: No changes noted in today's physical examination. There were no vitals filed for this visit. Joanna Brown is a pleasant 83 y.o. female WD, WN in NAD. AAO x 3.  Vascular Examination: Vascular status intact b/l with palpable pedal pulses. CFT immediate b/l. Pedal hair present. No edema. No pain with calf compression b/l. Skin temperature gradient WNL b/l. No varicosities noted. No cyanosis or clubbing noted.  Neurological Examination: Sensation grossly intact b/l with 10 gram monofilament.   Dermatological Examination: Pedal skin with normal turgor, texture and tone b/l. No open wounds nor interdigital macerations noted. Toenails 1-5 b/l thick, discolored, elongated with subungual debris and pain on dorsal palpation. No hyperkeratotic lesions noted b/l.   Musculoskeletal Examination: Muscle strength 5/5 to b/l LE.  No pain, crepitus noted b/l. HAV with bunion b/l. Patient ambulates independently without assistive aids.   Radiographs: None  Assessment/Plan: 1. Pain due to onychomycosis of toenails of both feet   2. Diabetes mellitus due to underlying condition with  stage 3 chronic kidney disease, with long-term current use of insulin , unspecified whether stage 3a or 3b CKD (HCC)    Consent given for treatment. Patient examined. All patient's and/or POA's questions/concerns addressed on today's visit. Mycotic toenails 1-5 debrided in length and girth without incident. Treatment was provided by assistant Mable FABIENE Cherry under my supervision. Continue foot and shoe inspections daily. Monitor blood glucose per PCP/Endocrinologist's recommendations.Continue soft, supportive shoe gear daily. Report any pedal injuries to medical professional. Call office if there are any quesitons/concerns.  Return in about 3 months (around 04/10/2024).  Delon LITTIE Merlin, DPM      [facility] LOCATION: [address], [place] [address]                   Office [phone]   [facility] LOCATION: [address] [place], [place] [address] Office [phone]

## 2024-01-12 NOTE — Telephone Encounter (Signed)
 FYI:  Returned the pt's call regarding her constipation. No ans. LMOVM advising her to make sure she is doing Miralax  and Fiber according to your last ov. Advised we would contact her back on Tuesday of next week.

## 2024-01-13 ENCOUNTER — Other Ambulatory Visit: Payer: Self-pay | Admitting: Internal Medicine

## 2024-01-25 ENCOUNTER — Ambulatory Visit: Payer: Self-pay | Admitting: Gastroenterology

## 2024-01-31 ENCOUNTER — Other Ambulatory Visit: Payer: Self-pay | Admitting: Family Medicine

## 2024-02-02 ENCOUNTER — Telehealth: Payer: Self-pay

## 2024-02-02 NOTE — Telephone Encounter (Signed)
No one from our office has called

## 2024-02-02 NOTE — Telephone Encounter (Signed)
 Copied from CRM [id number]. Topic: General - Call Back - No Documentation >> Feb 02, 2024 11:06 AM Willma R wrote: Reason for CRM: Patient states missed a call from the office this morning. Is requesting a call back to confirm what it was in regards to.  Patient can be reached at [phone]

## 2024-02-07 ENCOUNTER — Ambulatory Visit (HOSPITAL_COMMUNITY)
Admission: RE | Admit: 2024-02-07 | Discharge: 2024-02-07 | Disposition: A | Discharge status: Home / Self Care | Source: Ambulatory Visit | Attending: Family Medicine | Admitting: Family Medicine

## 2024-02-07 DIAGNOSIS — Z1231 Encounter for screening mammogram for malignant neoplasm of breast: Secondary | ICD-10-CM | POA: Insufficient documentation

## 2024-02-15 ENCOUNTER — Other Ambulatory Visit: Payer: Self-pay | Admitting: Internal Medicine

## 2024-02-29 ENCOUNTER — Encounter: Payer: Self-pay | Admitting: Gastroenterology

## 2024-03-01 ENCOUNTER — Ambulatory Visit: Payer: Self-pay

## 2024-03-21 ENCOUNTER — Other Ambulatory Visit: Payer: Self-pay | Admitting: "Endocrinology

## 2024-03-25 DIAGNOSIS — N1831 Chronic kidney disease, stage 3a: Secondary | ICD-10-CM | POA: Diagnosis not present

## 2024-03-25 DIAGNOSIS — E782 Mixed hyperlipidemia: Secondary | ICD-10-CM | POA: Diagnosis not present

## 2024-03-26 LAB — CMP14+EGFR
ALT: 10 IU/L (ref 0–32)
AST: 17 IU/L (ref 0–40)
Albumin: 4.2 g/dL (ref 3.7–4.7)
Alkaline Phosphatase: 48 IU/L (ref 48–129)
BUN/Creatinine Ratio: 16 (ref 12–28)
BUN: 15 mg/dL (ref 8–27)
Bilirubin Total: 0.4 mg/dL (ref 0.0–1.2)
CO2: 27 mmol/L (ref 20–29)
Calcium: 9.8 mg/dL (ref 8.7–10.3)
Chloride: 94 mmol/L — ABNORMAL LOW (ref 96–106)
Creatinine, Ser: 0.93 mg/dL (ref 0.57–1.00)
Globulin, Total: 3 g/dL (ref 1.5–4.5)
Glucose: 230 mg/dL — ABNORMAL HIGH (ref 70–99)
Potassium: 3.5 mmol/L (ref 3.5–5.2)
Sodium: 138 mmol/L (ref 134–144)
Total Protein: 7.2 g/dL (ref 6.0–8.5)
eGFR: 61 mL/min/1.73 (ref >59)

## 2024-03-26 LAB — LIPID PANEL
Chol/HDL Ratio: 4.1 ratio (ref 0.0–4.4)
Cholesterol, Total: 160 mg/dL (ref 100–199)
HDL: 39 mg/dL — ABNORMAL LOW (ref >39)
LDL Chol Calc (NIH): 90 mg/dL (ref 0–99)
Triglycerides: 178 mg/dL — ABNORMAL HIGH (ref 0–149)
VLDL Cholesterol Cal: 31 mg/dL (ref 5–40)

## 2024-04-01 DIAGNOSIS — I10 Essential (primary) hypertension: Secondary | ICD-10-CM | POA: Diagnosis not present

## 2024-04-01 DIAGNOSIS — N189 Chronic kidney disease, unspecified: Secondary | ICD-10-CM | POA: Diagnosis not present

## 2024-04-01 DIAGNOSIS — E211 Secondary hyperparathyroidism, not elsewhere classified: Secondary | ICD-10-CM | POA: Diagnosis not present

## 2024-04-01 DIAGNOSIS — D631 Anemia in chronic kidney disease: Secondary | ICD-10-CM | POA: Diagnosis not present

## 2024-04-01 DIAGNOSIS — R809 Proteinuria, unspecified: Secondary | ICD-10-CM | POA: Diagnosis not present

## 2024-04-01 DIAGNOSIS — E559 Vitamin D deficiency, unspecified: Secondary | ICD-10-CM | POA: Diagnosis not present

## 2024-04-03 ENCOUNTER — Other Ambulatory Visit: Payer: Self-pay | Admitting: "Endocrinology

## 2024-04-03 ENCOUNTER — Emergency Department (HOSPITAL_COMMUNITY)
Admission: EM | Admit: 2024-04-03 | Discharge: 2024-04-03 | Disposition: A | Discharge status: Home / Self Care | Attending: Emergency Medicine | Admitting: Emergency Medicine

## 2024-04-03 ENCOUNTER — Other Ambulatory Visit: Payer: Self-pay

## 2024-04-03 ENCOUNTER — Ambulatory Visit: Admitting: Family Medicine

## 2024-04-03 ENCOUNTER — Encounter (HOSPITAL_COMMUNITY): Payer: Self-pay | Admitting: *Deleted

## 2024-04-03 ENCOUNTER — Encounter: Payer: Self-pay | Admitting: Family Medicine

## 2024-04-03 VITALS — BP 160/78 | HR 70 | Resp 16 | Ht 66.0 in | Wt 169.1 lb

## 2024-04-03 DIAGNOSIS — R42 Dizziness and giddiness: Secondary | ICD-10-CM

## 2024-04-03 DIAGNOSIS — I10 Essential (primary) hypertension: Secondary | ICD-10-CM

## 2024-04-03 DIAGNOSIS — E860 Dehydration: Secondary | ICD-10-CM | POA: Diagnosis not present

## 2024-04-03 DIAGNOSIS — Z23 Encounter for immunization: Secondary | ICD-10-CM | POA: Diagnosis not present

## 2024-04-03 DIAGNOSIS — E1159 Type 2 diabetes mellitus with other circulatory complications: Secondary | ICD-10-CM

## 2024-04-03 DIAGNOSIS — Z794 Long term (current) use of insulin: Secondary | ICD-10-CM | POA: Diagnosis not present

## 2024-04-03 DIAGNOSIS — E785 Hyperlipidemia, unspecified: Secondary | ICD-10-CM | POA: Diagnosis not present

## 2024-04-03 DIAGNOSIS — E1169 Type 2 diabetes mellitus with other specified complication: Secondary | ICD-10-CM | POA: Diagnosis not present

## 2024-04-03 DIAGNOSIS — E876 Hypokalemia: Secondary | ICD-10-CM | POA: Diagnosis not present

## 2024-04-03 DIAGNOSIS — E1122 Type 2 diabetes mellitus with diabetic chronic kidney disease: Secondary | ICD-10-CM

## 2024-04-03 LAB — CBC WITH DIFFERENTIAL/PLATELET
Abs Immature Granulocytes: 0.02 K/uL (ref 0.00–0.07)
Basophils Absolute: 0 K/uL (ref 0.0–0.1)
Basophils Relative: 0 %
Eosinophils Absolute: 0 K/uL (ref 0.0–0.5)
Eosinophils Relative: 0 %
HCT: 41 % (ref 36.0–46.0)
Hemoglobin: 13.5 g/dL (ref 12.0–15.0)
Immature Granulocytes: 0 %
Lymphocytes Relative: 5 %
Lymphs Abs: 0.5 K/uL — ABNORMAL LOW (ref 0.7–4.0)
MCH: 28.5 pg (ref 26.0–34.0)
MCHC: 32.9 g/dL (ref 30.0–36.0)
MCV: 86.5 fL (ref 80.0–100.0)
Monocytes Absolute: 0.4 K/uL (ref 0.1–1.0)
Monocytes Relative: 4 %
Neutro Abs: 9.9 K/uL — ABNORMAL HIGH (ref 1.7–7.7)
Neutrophils Relative %: 91 %
Platelets: 159 K/uL (ref 150–400)
RBC: 4.74 MIL/uL (ref 3.87–5.11)
RDW: 13.3 % (ref 11.5–15.5)
WBC: 10.9 K/uL — ABNORMAL HIGH (ref 4.0–10.5)
nRBC: 0 % (ref 0.0–0.2)

## 2024-04-03 LAB — COMPREHENSIVE METABOLIC PANEL WITH GFR
ALT: 15 U/L (ref 0–44)
AST: 23 U/L (ref 15–41)
Albumin: 4.3 g/dL (ref 3.5–5.0)
Alkaline Phosphatase: 45 U/L (ref 38–126)
Anion gap: 13 (ref 5–15)
BUN: 23 mg/dL (ref 8–23)
CO2: 31 mmol/L (ref 22–32)
Calcium: 9.8 mg/dL (ref 8.9–10.3)
Chloride: 96 mmol/L — ABNORMAL LOW (ref 98–111)
Creatinine, Ser: 1.27 mg/dL — ABNORMAL HIGH (ref 0.44–1.00)
GFR, Estimated: 42 mL/min — ABNORMAL LOW (ref >60)
Glucose, Bld: 147 mg/dL — ABNORMAL HIGH (ref 70–99)
Potassium: 3.1 mmol/L — ABNORMAL LOW (ref 3.5–5.1)
Sodium: 140 mmol/L (ref 135–145)
Total Bilirubin: 0.6 mg/dL (ref 0.0–1.2)
Total Protein: 7.6 g/dL (ref 6.5–8.1)

## 2024-04-03 LAB — TROPONIN T, HIGH SENSITIVITY
Troponin T High Sensitivity: 17 ng/L (ref 0–19)
Troponin T High Sensitivity: 18 ng/L (ref 0–19)

## 2024-04-03 LAB — CBG MONITORING, ED: Glucose-Capillary: 128 mg/dL — ABNORMAL HIGH (ref 70–99)

## 2024-04-03 MED ORDER — POTASSIUM CHLORIDE CRYS ER 20 MEQ PO TBCR: 20.0000 meq | EXTENDED_RELEASE_TABLET | Freq: Every day | ORAL | 0 refills | Status: AC

## 2024-04-03 MED ORDER — POTASSIUM CHLORIDE CRYS ER 20 MEQ PO TBCR
40.0000 meq | EXTENDED_RELEASE_TABLET | Freq: Once | ORAL | Status: AC
  Administered 2024-04-03: 40 meq via ORAL
  Filled 2024-04-03: qty 2

## 2024-04-03 MED ORDER — ONDANSETRON HCL 4 MG/2ML IJ SOLN
4.0000 mg | Freq: Once | INTRAMUSCULAR | Status: DC
  Filled 2024-04-03: qty 2

## 2024-04-03 MED ORDER — TIRZEPATIDE 5 MG/0.5ML ~~LOC~~ SOAJ: 5.0000 mg | SUBCUTANEOUS | 0 refills | Status: AC

## 2024-04-03 MED ORDER — SODIUM CHLORIDE 0.9 % IV BOLUS
1000.0000 mL | Freq: Once | INTRAVENOUS | Status: AC
  Administered 2024-04-03: 1000 mL via INTRAVENOUS

## 2024-04-03 NOTE — ED Triage Notes (Addendum)
 Pt with emesis since 1630 today, went to to [facility] and possible passed out.  Pt with emesis in triage. Pt got the flu shot, never has one in the past. + chills

## 2024-04-03 NOTE — Patient Instructions (Signed)
 Nurse BP check ion 3 weeks  Flu vaccine today in office  Please get covid vaccine at your pharmacy  MD follow up in 5 months  You need to get in touch with Dr Lenis this wek so you can get your diabetes medication (mounjaro ) your blood sugar is high  Blood pressure is high, carvedilol  is TWO times dAILY, 12 HOURS APART, SO IF YOU TAKE MORNING TAB AT 8 AM THEN EVENING TABLET IS 8 PM  Continue to cut back on fried and fatty foods, truiglycerides have improved  It is important that you exercise regularly at least 30 minutes 5 times a week. If you develop chest pain, have severe difficulty breathing, or feel very tired, stop exercising immediately and seek medical attention    Think about what you will eat, plan ahead. Choose  clean, green, fresh or frozen over canned, processed or packaged foods which are more sugary, salty and fatty. 70 to 75% of food eaten should be vegetables and fruit. Three meals at set times with snacks allowed between meals, but they must be fruit or vegetables. Aim to eat over a 12 hour period , example 7 am to 7 pm, and STOP after  your last meal of the day. Drink water ,generally about 64 ounces per day, no other drink is as healthy. Fruit juice is best enjoyed in a healthy way, by EATING the fruit. Thanks for choosing [facility], we consider it a privelige to serve you.

## 2024-04-03 NOTE — Discharge Instructions (Signed)
 Drink more fluids and follow-up with your family doctor next week

## 2024-04-04 ENCOUNTER — Telehealth: Payer: Self-pay | Admitting: Family Medicine

## 2024-04-04 DIAGNOSIS — E785 Hyperlipidemia, unspecified: Secondary | ICD-10-CM | POA: Insufficient documentation

## 2024-04-04 DIAGNOSIS — Z23 Encounter for immunization: Secondary | ICD-10-CM | POA: Insufficient documentation

## 2024-04-04 MED ORDER — CARVEDILOL 12.5 MG PO TABS: 12.5000 mg | ORAL_TABLET | Freq: Two times a day (BID) | ORAL | 5 refills | Status: AC

## 2024-04-04 NOTE — Assessment & Plan Note (Signed)
 After obtaining informed consent, the influenza vaccine is  administered , with no adverse effect noted at the time of administration.

## 2024-04-04 NOTE — Assessment & Plan Note (Signed)
 Uncontrolled, inadvertently incorrectly taking carvedilol , educated as to correct dosing, nurse BP check in 3 weeks DASH diet and commitment to daily physical activity for a minimum of 30 minutes discussed and encouraged, as a part of hypertension management. The importance of attaining a healthy weight is also discussed.     04/03/2024    9:30 PM 04/03/2024    8:06 PM 04/03/2024    4:58 PM 04/03/2024    4:56 PM 04/03/2024    9:44 AM 04/03/2024    9:33 AM 11/23/2023    9:21 AM  BP/Weight  Systolic BP 127 134 132  160 164 132  Diastolic BP 62 67 70  78 66 60  Wt. (Lbs)    170  169.12 176.6  BMI    27.44 kg/m2  27.3 kg/m2 29.39 kg/m2

## 2024-04-04 NOTE — Assessment & Plan Note (Addendum)
 Diabetes associated with hypertension, hyperlipidemia, and heart disease  Joanna Brown is reminded of the importance of commitment to daily physical activity for 30 minutes or more, as able and the need to limit carbohydrate intake to 30 to 60 grams per meal to help with blood sugar control.   The need to take medication as prescribed, test blood sugar as directed, and to call between visits if there is a concern that blood sugar is uncontrolled is also discussed.   Joanna Brown is reminded of the importance of daily foot exam, annual eye examination, and good blood sugar, blood pressure and cholesterol control.     Latest Ref Rng & Units 04/03/2024    6:26 PM 03/25/2024    8:02 AM 01/03/2024    8:03 AM 11/23/2023   10:34 AM 11/22/2023   10:26 AM  Diabetic Labs  HbA1c 0.0 - 7.0 %    8.8    Chol 100 - 199 mg/dL  839      HDL >60 mg/dL  39      Calc LDL 0 - 99 mg/dL  90      Triglycerides 0 - 149 mg/dL  821      Creatinine 9.55 - 1.00 mg/dL 8.72  9.06  8.95   8.94       04/03/2024    9:30 PM 04/03/2024    8:06 PM 04/03/2024    4:58 PM 04/03/2024    4:56 PM 04/03/2024    9:44 AM 04/03/2024    9:33 AM 11/23/2023    9:21 AM  BP/Weight  Systolic BP 127 134 132  160 164 132  Diastolic BP 62 67 70  78 66 60  Wt. (Lbs)    170  169.12 176.6  BMI    27.44 kg/m2  27.3 kg/m2 29.39 kg/m2      Latest Ref Rng & Units 08/03/2021    9:20 AM 03/09/2021   12:00 AM  Foot/eye exam completion dates  Eye Exam No Retinopathy  No Retinopathy      Foot Form Completion  Done      This result is from an external source.    Managed by Endo and uncontroled, out of mounjaro  will reach out to Endo

## 2024-04-04 NOTE — Progress Notes (Signed)
 Joanna Brown     MRN: [id number]      DOB: 07-01-1940  Chief Complaint  Patient presents with   Medical Management of Chronic Issues    4 month follow up     HPI Joanna Brown is here for follow up and re-evaluation of chronic medical conditions, medication management and review of any available recent lab and radiology data.  Preventive health is updated, specifically  Cancer screening and Immunization.   Questions or concerns regarding consultations or procedures which the PT has had in the interim are  addressed. The PT denies any adverse reactions to current medications since the last visit.  C/o elevated blood sugae as out of medication  ROS Denies recent fever or chills. Denies sinus pressure, nasal congestion, ear pain or sore throat. Denies chest congestion, productive cough or wheezing. Denies chest pains, palpitations and leg swelling Denies abdominal pain, nausea, vomiting,diarrhea or constipation.   Denies dysuria, frequency, hesitancy or incontinence. Denies joint pain, swelling and limitation in mobility. Denies headaches, seizures, numbness, or tingling. Denies depression, anxiety or insomnia. Denies skin break down or rash.   PE  BP (!) 160/78   Pulse 70   Resp 16   Ht 5' 6 (1.676 m)   Wt 169 lb 1.9 oz (76.7 kg)   SpO2 96%   BMI 27.30 kg/m   Patient alert and oriented and in no cardiopulmonary distress.  HEENT: No facial asymmetry, EOMI,     Neck supple .  Chest: Clear to auscultation bilaterally.  CVS: S1, S2 no murmurs, no S3.Regular rate.  ABD: Soft non tender.   Ext: No edema  MS: Adequate ROM spine, shoulders, hips and knees.  Skin: Intact, no ulcerations or rash noted.  Psych: Good eye contact, normal affect. Memory intact not anxious or depressed appearing.  CNS: CN 2-12 intact, power,  normal throughout.no focal deficits noted.   Assessment & Plan  Influenza vaccination administered at current visit After obtaining informed  consent, the  influenza vaccine is  administered , with no adverse effect noted at the time of administration.   Essential hypertension Uncontrolled, inadvertently incorrectly taking carvedilol , educated as to correct dosing, nurse BP check in 3 weeks DASH diet and commitment to daily physical activity for a minimum of 30 minutes discussed and encouraged, as a part of hypertension management. The importance of attaining a healthy weight is also discussed.     04/03/2024    9:30 PM 04/03/2024    8:06 PM 04/03/2024    4:58 PM 04/03/2024    4:56 PM 04/03/2024    9:44 AM 04/03/2024    9:33 AM 11/23/2023    9:21 AM  BP/Weight  Systolic BP 127 134 132  160 164 132  Diastolic BP 62 67 70  78 66 60  Wt. (Lbs)    170  169.12 176.6  BMI    27.44 kg/m2  27.3 kg/m2 29.39 kg/m2       Hyperlipidemia LDL goal <100 Hyperlipidemia:Low fat diet discussed and encouraged.   Lipid Panel  Lab Results  Component Value Date   CHOL 160 03/25/2024   HDL 39 (L) 03/25/2024   LDLCALC 90 03/25/2024   TRIG 178 (H) 03/25/2024   CHOLHDL 4.1 03/25/2024     Needs to reduce fat in diet as TG elevated, increased execise commitment encouraged , low HDL  Type 2 diabetes mellitus with stage 1 chronic kidney disease, with long-term current use of insulin  (HCC) Diabetes associated with hypertension, hyperlipidemia, and  heart disease  Joanna Brown is reminded of the importance of commitment to daily physical activity for 30 minutes or more, as able and the need to limit carbohydrate intake to 30 to 60 grams per meal to help with blood sugar control.   The need to take medication as prescribed, test blood sugar as directed, and to call between visits if there is a concern that blood sugar is uncontrolled is also discussed.   Joanna Brown is reminded of the importance of daily foot exam, annual eye examination, and good blood sugar, blood pressure and cholesterol control.     Latest Ref Rng & Units 04/03/2024     6:26 PM 03/25/2024    8:02 AM 01/03/2024    8:03 AM 11/23/2023   10:34 AM 11/22/2023   10:26 AM  Diabetic Labs  HbA1c 0.0 - 7.0 %    8.8    Chol 100 - 199 mg/dL  839      HDL >60 mg/dL  39      Calc LDL 0 - 99 mg/dL  90      Triglycerides 0 - 149 mg/dL  821      Creatinine 9.55 - 1.00 mg/dL 8.72  9.06  8.95   8.94       04/03/2024    9:30 PM 04/03/2024    8:06 PM 04/03/2024    4:58 PM 04/03/2024    4:56 PM 04/03/2024    9:44 AM 04/03/2024    9:33 AM 11/23/2023    9:21 AM  BP/Weight  Systolic BP 127 134 132  160 164 132  Diastolic BP 62 67 70  78 66 60  Wt. (Lbs)    170  169.12 176.6  BMI    27.44 kg/m2  27.3 kg/m2 29.39 kg/m2      Latest Ref Rng & Units 08/03/2021    9:20 AM 03/09/2021   12:00 AM  Foot/eye exam completion dates  Eye Exam No Retinopathy  No Retinopathy      Foot Form Completion  Done      This result is from an external source.    Managed by Endo and uncontroled, out of mounjaro  will reach out to Endo

## 2024-04-04 NOTE — ED Provider Notes (Signed)
 [place] EMERGENCY DEPARTMENT AT [facility] Provider Note   CSN: [id number] Arrival date & time: 04/03/24  1631     Patient presents with: Dizziness   Joanna Brown is a 83 y.o. female.   Patient had her flu shot today and has vomited twice with some dizziness.  She feels better now.  The history is provided by the patient and medical records. No language interpreter was used.  Dizziness Quality:  Lightheadedness Severity:  Mild Onset quality:  Sudden Timing:  Intermittent Progression:  Resolved Chronicity:  New Context: not when bending over   Relieved by:  Nothing Associated symptoms: no chest pain, no diarrhea and no headaches        Prior to Admission medications   Medication Sig Start Date End Date Taking? Authorizing Provider  potassium chloride  SA (KLOR-CON  M) 20 MEQ tablet Take 1 tablet (20 mEq total) by mouth daily. 04/03/24  Yes Suzette Pac, MD  amLODipine  (NORVASC ) 10 MG tablet TAKE 1 TABLET(10 MG) BY MOUTH EVERY MORNING 10/25/23   Antonetta Rollene BRAVO, MD  B-D ULTRAFINE III SHORT PEN 31G X 8 MM MISC USE WITH INSULIN  TWICE DAILY AS DIRECTED 08/22/23   Nida, Gebreselassie W, MD  benazepril  (LOTENSIN ) 40 MG tablet TAKE 1 TABLET(40 MG) BY MOUTH DAILY 12/25/23   Antonetta Rollene BRAVO, MD  Blood Glucose Monitoring Suppl (ACCU-CHEK GUIDE) w/Device KIT 1 each by Does not apply route 4 (four) times daily. 01/06/23   Nida, Gebreselassie W, MD  carvedilol  (COREG ) 12.5 MG tablet Take 1 tablet (12.5 mg total) by mouth 2 (two) times daily with a meal. 04/04/24   Antonetta Rollene BRAVO, MD  chlorthalidone  (HYGROTON ) 25 MG tablet TAKE 1 TABLET(25 MG) BY MOUTH DAILY 02/15/24   Hilty, Vinie BROCKS, MD  cholecalciferol (VITAMIN D3) 25 MCG (1000 UT) tablet Take 1,000 Units by mouth in the morning.    [provider]  clopidogrel  (PLAVIX ) 75 MG tablet TAKE 1 TABLET(75 MG) BY MOUTH DAILY 07/14/23   Antonetta Rollene BRAVO, MD  Continuous Glucose Receiver (FREESTYLE LIBRE 3  READER) DEVI Use to test BG 4 + times daily. E11.65 01/12/23   Lenis Ethelle ORN, MD  ezetimibe  (ZETIA ) 10 MG tablet TAKE 1 TABLET(10 MG) BY MOUTH DAILY 01/16/24   Hilty, Vinie BROCKS, MD  glucose blood (ACCU-CHEK GUIDE TEST) test strip USE TO CHECK BLOOD SUGAR IN THE MORNING, AT NOON, IN THE EVENING AND AT BEDTIME 04/24/23   Nida, Gebreselassie W, MD  HYDROcodone -acetaminophen  (NORCO/VICODIN) 5-325 MG tablet Take 2 tablets by mouth every 6 (six) hours as needed. 10/01/23   Suellen Cantor A, PA-C  insulin  isophane & regular human KwikPen (HUMULIN  70/30 KWIKPEN) (70-30) 100 UNIT/ML KwikPen INJECT 50 UNITS WITH BREAKFAST AND 40 UNITS WITH SUPPER WHEN GLUCOSE READING ABOVE 90 12/06/23   Nida, Gebreselassie W, MD  latanoprost  (XALATAN ) 0.005 % ophthalmic solution Place 1 drop into both eyes at bedtime.  02/18/19   [provider]  lubiprostone  (AMITIZA ) 24 MCG capsule Take 1 capsule (24 mcg total) by mouth 2 (two) times daily with a meal. 03/30/23   Shirlean Therisa ORN, NP  metFORMIN  (GLUCOPHAGE ) 500 MG tablet TAKE 1 TABLET(500 MG) BY MOUTH DAILY WITH BREAKFAST 03/21/24   Nida, Gebreselassie W, MD  Multiple Vitamin (MULTIVITAMIN WITH MINERALS) TABS tablet Take 1 tablet by mouth daily.    [provider]  Omega-3 Fatty Acids (FISH OIL) 1200 MG CAPS Take 1,200 mg by mouth every morning.    [provider]  pantoprazole  (  PROTONIX ) 40 MG tablet TAKE 1 TABLET BY MOUTH DAILY 30 MINUTES BEFORE BREAKFAST 12/11/23   Shirlean Therisa ORN, NP  potassium chloride  (KLOR-CON ) 10 MEQ tablet Take 1 tablet (10 mEq total) by mouth 2 (two) times daily. 10/01/23   Suellen Cantor A, PA-C  pravastatin  (PRAVACHOL ) 80 MG tablet TAKE 1 TABLET(80 MG) BY MOUTH EVERY EVENING 01/08/24   Hilty, Vinie BROCKS, MD  tirzepatide  (MOUNJARO ) 5 MG/0.5ML Pen Inject 5 mg into the skin once a week. 04/03/24   Nida, Gebreselassie W, MD    Allergies: Inspra [eplerenone], Senokot wheat bran [wheat], and Spironolactone     Review of Systems   Constitutional:  Negative for appetite change and fatigue.  HENT:  Negative for congestion, ear discharge and sinus pressure.   Eyes:  Negative for discharge.  Respiratory:  Negative for cough.   Cardiovascular:  Negative for chest pain.  Gastrointestinal:  Negative for abdominal pain and diarrhea.  Genitourinary:  Negative for frequency and hematuria.  Musculoskeletal:  Negative for back pain.  Skin:  Negative for rash.  Neurological:  Positive for dizziness. Negative for seizures and headaches.  Psychiatric/Behavioral:  Negative for hallucinations.     Updated Vital Signs BP 127/62   Pulse 91   Temp 98 F (36.7 C)   Resp 18   Ht 5' 6 (1.676 m)   Wt 77.1 kg   SpO2 95%   BMI 27.44 kg/m   Physical Exam Vitals and nursing note reviewed.  Constitutional:      Appearance: She is well-developed.  HENT:     Head: Normocephalic.     Nose: Nose normal.  Eyes:     General: No scleral icterus.    Conjunctiva/sclera: Conjunctivae normal.  Neck:     Thyroid : No thyromegaly.  Cardiovascular:     Rate and Rhythm: Normal rate and regular rhythm.     Heart sounds: No murmur heard.    No friction rub. No gallop.  Pulmonary:     Breath sounds: No stridor. No wheezing or rales.  Chest:     Chest wall: No tenderness.  Abdominal:     General: There is no distension.     Tenderness: There is no abdominal tenderness. There is no rebound.  Musculoskeletal:        General: Normal range of motion.     Cervical back: Neck supple.  Lymphadenopathy:     Cervical: No cervical adenopathy.  Skin:    Findings: No erythema or rash.  Neurological:     Mental Status: She is alert and oriented to person, place, and time.     Motor: No abnormal muscle tone.     Coordination: Coordination normal.  Psychiatric:        Behavior: Behavior normal.     (all labs ordered are listed, but only abnormal results are displayed) Labs Reviewed  CBC WITH DIFFERENTIAL/PLATELET - Abnormal; Notable for  the following components:      Result Value   WBC 10.9 (*)    Neutro Abs 9.9 (*)    Lymphs Abs 0.5 (*)    All other components within normal limits  COMPREHENSIVE METABOLIC PANEL WITH GFR - Abnormal; Notable for the following components:   Potassium 3.1 (*)    Chloride 96 (*)    Glucose, Bld 147 (*)    Creatinine, Ser 1.27 (*)    GFR, Estimated 42 (*)    All other components within normal limits  CBG MONITORING, ED - Abnormal; Notable for the following components:  Glucose-Capillary 128 (*)    All other components within normal limits  TROPONIN T, HIGH SENSITIVITY  TROPONIN T, HIGH SENSITIVITY    EKG: EKG Interpretation Date/Time:  Wednesday April 03 2024 17:04:44 EST Ventricular Rate:  83 PR Interval:  182 QRS Duration:  106 QT Interval:  408 QTC Calculation: 479 R Axis:   -58  Text Interpretation: Normal sinus rhythm Incomplete right bundle branch block Left anterior fascicular block Moderate voltage criteria for LVH, may be normal variant ( R in aVL , Cornell product ) Nonspecific T wave abnormality Prolonged QT Abnormal ECG When compared with ECG of 10-Nov-2023 10:07, No significant change was found Confirmed by Cleotilde Rogue ([id number]) on 04/04/2024 9:20:14 AM  Radiology: No results found.   Procedures   Medications Ordered in the ED  sodium chloride  0.9 % bolus 1,000 mL (0 mLs Intravenous Stopped 04/03/24 2149)  potassium chloride  SA (KLOR-CON  M) CR tablet 40 mEq (40 mEq Oral Given 04/03/24 2155)                                    Medical Decision Making Amount and/or Complexity of Data Reviewed Labs: ordered.  Risk Prescription drug management.   Patient with dehydration from vomiting.  Possibly related to flu shot.  Patient improved with fluids and also has hypokalemia that she has been treated for.  Patient will follow-up with PCP     Final diagnoses:  Dizziness  Dehydration  Hypokalemia    ED Discharge Orders          Ordered    potassium  chloride SA (KLOR-CON  M) 20 MEQ tablet  Daily        04/03/24 2143               Suzette Pac, MD 04/04/24 1412

## 2024-04-04 NOTE — Telephone Encounter (Signed)
 I placed a f/u call this pm as pt went to ED yesterday following office visit, to see how she was doing.  Message left on machine. Pls call on 11/21 to see if she is able to pick up call and check on her, thanks

## 2024-04-04 NOTE — Assessment & Plan Note (Signed)
 Hyperlipidemia:Low fat diet discussed and encouraged.   Lipid Panel  Lab Results  Component Value Date   CHOL 160 03/25/2024   HDL 39 (L) 03/25/2024   LDLCALC 90 03/25/2024   TRIG 178 (H) 03/25/2024   CHOLHDL 4.1 03/25/2024     Needs to reduce fat in diet as TG elevated, increased execise commitment encouraged , low HDL

## 2024-04-05 NOTE — Telephone Encounter (Signed)
 Lvm to cb

## 2024-04-07 ENCOUNTER — Other Ambulatory Visit: Payer: Self-pay

## 2024-04-07 ENCOUNTER — Emergency Department (HOSPITAL_COMMUNITY)

## 2024-04-07 ENCOUNTER — Encounter (HOSPITAL_COMMUNITY): Payer: Self-pay | Admitting: *Deleted

## 2024-04-07 ENCOUNTER — Emergency Department (HOSPITAL_COMMUNITY): Admission: EM | Admit: 2024-04-07 | Discharge: 2024-04-07 | Disposition: A | Discharge status: Home / Self Care

## 2024-04-07 DIAGNOSIS — E876 Hypokalemia: Secondary | ICD-10-CM | POA: Insufficient documentation

## 2024-04-07 DIAGNOSIS — Z7902 Long term (current) use of antithrombotics/antiplatelets: Secondary | ICD-10-CM | POA: Insufficient documentation

## 2024-04-07 DIAGNOSIS — Z794 Long term (current) use of insulin: Secondary | ICD-10-CM | POA: Diagnosis not present

## 2024-04-07 DIAGNOSIS — Z79899 Other long term (current) drug therapy: Secondary | ICD-10-CM | POA: Diagnosis not present

## 2024-04-07 DIAGNOSIS — E11649 Type 2 diabetes mellitus with hypoglycemia without coma: Secondary | ICD-10-CM | POA: Insufficient documentation

## 2024-04-07 DIAGNOSIS — I10 Essential (primary) hypertension: Secondary | ICD-10-CM | POA: Diagnosis not present

## 2024-04-07 DIAGNOSIS — E162 Hypoglycemia, unspecified: Secondary | ICD-10-CM

## 2024-04-07 DIAGNOSIS — R2242 Localized swelling, mass and lump, left lower limb: Secondary | ICD-10-CM | POA: Insufficient documentation

## 2024-04-07 DIAGNOSIS — R748 Abnormal levels of other serum enzymes: Secondary | ICD-10-CM | POA: Insufficient documentation

## 2024-04-07 DIAGNOSIS — I700 Atherosclerosis of aorta: Secondary | ICD-10-CM | POA: Diagnosis not present

## 2024-04-07 DIAGNOSIS — R519 Headache, unspecified: Secondary | ICD-10-CM | POA: Insufficient documentation

## 2024-04-07 DIAGNOSIS — R7989 Other specified abnormal findings of blood chemistry: Secondary | ICD-10-CM | POA: Diagnosis not present

## 2024-04-07 DIAGNOSIS — Z7984 Long term (current) use of oral hypoglycemic drugs: Secondary | ICD-10-CM | POA: Diagnosis not present

## 2024-04-07 DIAGNOSIS — M7989 Other specified soft tissue disorders: Secondary | ICD-10-CM

## 2024-04-07 DIAGNOSIS — R0602 Shortness of breath: Secondary | ICD-10-CM | POA: Diagnosis not present

## 2024-04-07 LAB — COMPREHENSIVE METABOLIC PANEL WITH GFR
ALT: 15 U/L (ref 0–44)
AST: 22 U/L (ref 15–41)
Albumin: 4.4 g/dL (ref 3.5–5.0)
Alkaline Phosphatase: 44 U/L (ref 38–126)
Anion gap: 11 (ref 5–15)
BUN: 18 mg/dL (ref 8–23)
CO2: 31 mmol/L (ref 22–32)
Calcium: 10.1 mg/dL (ref 8.9–10.3)
Chloride: 94 mmol/L — ABNORMAL LOW (ref 98–111)
Creatinine, Ser: 0.92 mg/dL (ref 0.44–1.00)
GFR, Estimated: >60 mL/min (ref >60)
Glucose, Bld: 78 mg/dL (ref 70–99)
Potassium: 3 mmol/L — ABNORMAL LOW (ref 3.5–5.1)
Sodium: 136 mmol/L (ref 135–145)
Total Bilirubin: 0.4 mg/dL (ref 0.0–1.2)
Total Protein: 7.7 g/dL (ref 6.5–8.1)

## 2024-04-07 LAB — CBC WITH DIFFERENTIAL/PLATELET
Abs Immature Granulocytes: 0.04 K/uL (ref 0.00–0.07)
Basophils Absolute: 0.1 K/uL (ref 0.0–0.1)
Basophils Relative: 1 %
Eosinophils Absolute: 0.2 K/uL (ref 0.0–0.5)
Eosinophils Relative: 2 %
HCT: 40.6 % (ref 36.0–46.0)
Hemoglobin: 13.9 g/dL (ref 12.0–15.0)
Immature Granulocytes: 0 %
Lymphocytes Relative: 18 %
Lymphs Abs: 1.8 K/uL (ref 0.7–4.0)
MCH: 28.5 pg (ref 26.0–34.0)
MCHC: 34.2 g/dL (ref 30.0–36.0)
MCV: 83.4 fL (ref 80.0–100.0)
Monocytes Absolute: 0.7 K/uL (ref 0.1–1.0)
Monocytes Relative: 7 %
Neutro Abs: 7.3 K/uL (ref 1.7–7.7)
Neutrophils Relative %: 72 %
Platelets: 176 K/uL (ref 150–400)
RBC: 4.87 MIL/uL (ref 3.87–5.11)
RDW: 13.2 % (ref 11.5–15.5)
WBC: 10.1 K/uL (ref 4.0–10.5)
nRBC: 0 % (ref 0.0–0.2)

## 2024-04-07 LAB — CBG MONITORING, ED: Glucose-Capillary: 140 mg/dL — ABNORMAL HIGH (ref 70–99)

## 2024-04-07 LAB — D-DIMER, QUANTITATIVE: D-Dimer, Quant: 0.92 ug{FEU}/mL — ABNORMAL HIGH (ref 0.00–0.50)

## 2024-04-07 LAB — LIPASE, BLOOD: Lipase: 54 U/L — ABNORMAL HIGH (ref 11–51)

## 2024-04-07 MED ORDER — POTASSIUM CHLORIDE 20 MEQ PO PACK
20.0000 meq | PACK | Freq: Once | ORAL | Status: AC
  Administered 2024-04-07: 20 meq via ORAL
  Filled 2024-04-07: qty 1

## 2024-04-07 MED ORDER — IOHEXOL 350 MG/ML SOLN
75.0000 mL | Freq: Once | INTRAVENOUS | Status: AC | PRN
  Administered 2024-04-07: 75 mL via INTRAVENOUS

## 2024-04-07 NOTE — ED Provider Notes (Signed)
 [place] EMERGENCY DEPARTMENT AT [facility] Provider Note   CSN: [id number] Arrival date & time: 04/07/24  1840     Patient presents with: Headache   Joanna Brown is a 83 y.o. female presents for evaluation of hypoglycemia.  She reports several days of low blood sugar and notes that she was recently started on a higher dose of Mounjaro , which she took this morning.  She currently feels weak, tired, and has mild headache.  She denies syncope, focal neurological changes, chest pain, or shortness of breath.     Headache      Prior to Admission medications   Medication Sig Start Date End Date Taking? Authorizing Provider  amLODipine  (NORVASC ) 10 MG tablet TAKE 1 TABLET(10 MG) BY MOUTH EVERY MORNING 10/25/23   Antonetta Rollene BRAVO, MD  B-D ULTRAFINE III SHORT PEN 31G X 8 MM MISC USE WITH INSULIN  TWICE DAILY AS DIRECTED 08/22/23   Nida, Gebreselassie W, MD  benazepril  (LOTENSIN ) 40 MG tablet TAKE 1 TABLET(40 MG) BY MOUTH DAILY 12/25/23   Antonetta Rollene BRAVO, MD  Blood Glucose Monitoring Suppl (ACCU-CHEK GUIDE) w/Device KIT 1 each by Does not apply route 4 (four) times daily. 01/06/23   Nida, Gebreselassie W, MD  carvedilol  (COREG ) 12.5 MG tablet Take 1 tablet (12.5 mg total) by mouth 2 (two) times daily with a meal. 04/04/24   Antonetta Rollene BRAVO, MD  chlorthalidone  (HYGROTON ) 25 MG tablet TAKE 1 TABLET(25 MG) BY MOUTH DAILY 02/15/24   Hilty, Vinie BROCKS, MD  cholecalciferol (VITAMIN D3) 25 MCG (1000 UT) tablet Take 1,000 Units by mouth in the morning.    [provider]  clopidogrel  (PLAVIX ) 75 MG tablet TAKE 1 TABLET(75 MG) BY MOUTH DAILY 07/14/23   Antonetta Rollene BRAVO, MD  Continuous Glucose Receiver (FREESTYLE LIBRE 3 READER) DEVI Use to test BG 4 + times daily. E11.65 01/12/23   Lenis Ethelle ORN, MD  ezetimibe  (ZETIA ) 10 MG tablet TAKE 1 TABLET(10 MG) BY MOUTH DAILY 01/16/24   Hilty, Vinie BROCKS, MD  glucose blood (ACCU-CHEK GUIDE TEST) test strip USE TO CHECK BLOOD  SUGAR IN THE MORNING, AT NOON, IN THE EVENING AND AT BEDTIME 04/24/23   Nida, Gebreselassie W, MD  HYDROcodone -acetaminophen  (NORCO/VICODIN) 5-325 MG tablet Take 2 tablets by mouth every 6 (six) hours as needed. 10/01/23   Suellen Cantor A, PA-C  insulin  isophane & regular human KwikPen (HUMULIN  70/30 KWIKPEN) (70-30) 100 UNIT/ML KwikPen INJECT 50 UNITS WITH BREAKFAST AND 40 UNITS WITH SUPPER WHEN GLUCOSE READING ABOVE 90 12/06/23   Nida, Gebreselassie W, MD  latanoprost  (XALATAN ) 0.005 % ophthalmic solution Place 1 drop into both eyes at bedtime.  02/18/19   [provider]  lubiprostone  (AMITIZA ) 24 MCG capsule Take 1 capsule (24 mcg total) by mouth 2 (two) times daily with a meal. 03/30/23   Shirlean Therisa ORN, NP  metFORMIN  (GLUCOPHAGE ) 500 MG tablet TAKE 1 TABLET(500 MG) BY MOUTH DAILY WITH BREAKFAST 03/21/24   Nida, Gebreselassie W, MD  Multiple Vitamin (MULTIVITAMIN WITH MINERALS) TABS tablet Take 1 tablet by mouth daily.    [provider]  Omega-3 Fatty Acids (FISH OIL) 1200 MG CAPS Take 1,200 mg by mouth every morning.    [provider]  pantoprazole  (PROTONIX ) 40 MG tablet TAKE 1 TABLET BY MOUTH DAILY 30 MINUTES BEFORE BREAKFAST 12/11/23   Shirlean Therisa ORN, NP  potassium chloride  (KLOR-CON ) 10 MEQ tablet Take 1 tablet (10 mEq total) by mouth 2 (two) times daily. 10/01/23   Suellen,  Celeste A, PA-C  potassium chloride  SA (KLOR-CON  M) 20 MEQ tablet Take 1 tablet (20 mEq total) by mouth daily. 04/03/24   Suzette Pac, MD  pravastatin  (PRAVACHOL ) 80 MG tablet TAKE 1 TABLET(80 MG) BY MOUTH EVERY EVENING 01/08/24   Hilty, Vinie BROCKS, MD  tirzepatide  (MOUNJARO ) 5 MG/0.5ML Pen Inject 5 mg into the skin once a week. 04/03/24   Nida, Gebreselassie W, MD    Allergies: Inspra [eplerenone], Senokot wheat bran [wheat], and Spironolactone     Review of Systems  Neurological:  Positive for headaches.    Updated Vital Signs BP (!) 170/78   Pulse 66   Temp 98.1 F (36.7 C) (Oral)    Resp 18   Ht 5' 6 (1.676 m)   Wt 77.1 kg   SpO2 96%   BMI 27.44 kg/m   Physical Exam Vitals and nursing note reviewed.  Constitutional:      General: She is not in acute distress.    Appearance: Normal appearance.  HENT:     Head: Normocephalic and atraumatic.  Eyes:     Extraocular Movements: Extraocular movements intact.     Conjunctiva/sclera: Conjunctivae normal.     Pupils: Pupils are equal, round, and reactive to light.  Cardiovascular:     Rate and Rhythm: Normal rate and regular rhythm.     Pulses: Normal pulses.          Popliteal pulses are 2+ on the right side and 2+ on the left side.       Dorsalis pedis pulses are 2+ on the right side and 2+ on the left side.     Comments: Unilateral left lower extremity swelling without erythema, warmth, or tenderness. Pulmonary:     Effort: Pulmonary effort is normal. No respiratory distress.  Abdominal:     General: Abdomen is flat.     Palpations: Abdomen is soft.     Tenderness: There is no abdominal tenderness.  Musculoskeletal:        General: Normal range of motion.     Cervical back: Normal range of motion.     Left lower leg: 3+ Edema present.  Skin:    General: Skin is warm and dry.     Capillary Refill: Capillary refill takes less than 2 seconds.  Neurological:     General: No focal deficit present.     Mental Status: She is alert. Mental status is at baseline.  Psychiatric:        Mood and Affect: Mood normal.     (all labs ordered are listed, but only abnormal results are displayed) Labs Reviewed  COMPREHENSIVE METABOLIC PANEL WITH GFR - Abnormal; Notable for the following components:      Result Value   Potassium 3.0 (*)    Chloride 94 (*)    All other components within normal limits  LIPASE, BLOOD - Abnormal; Notable for the following components:   Lipase 54 (*)    All other components within normal limits  D-DIMER, QUANTITATIVE - Abnormal; Notable for the following components:   D-Dimer, Quant  0.92 (*)    All other components within normal limits  CBG MONITORING, ED - Abnormal; Notable for the following components:   Glucose-Capillary 140 (*)    All other components within normal limits  CBC WITH DIFFERENTIAL/PLATELET  CBG MONITORING, ED    EKG: EKG Interpretation Date/Time:  Sunday April 07 2024 20:04:10 EST Ventricular Rate:  67 PR Interval:  185 QRS Duration:  111 QT Interval:  413 QTC  Calculation: 436 R Axis:   -50  Text Interpretation: Sinus rhythm LAD, consider left anterior fascicular block Abnormal R-wave progression, early transition Left ventricular hypertrophy Confirmed by Midge Golas ([id number]) on 04/08/2024 9:05:26 AM  Radiology: No results found.   Procedures   Medications Ordered in the ED  iohexol  (OMNIPAQUE ) 350 MG/ML injection 75 mL (75 mLs Intravenous Contrast Given 04/07/24 2138)  potassium chloride  (KLOR-CON ) packet 20 mEq (20 mEq Oral Given 04/07/24 2241)                                    Medical Decision Making Amount and/or Complexity of Data Reviewed Labs: ordered. Radiology: ordered.   Patient presents to the ED for concern of hypoglycemia, this involves an extensive number of treatment options, and is a complaint that carries with it a high risk of complications and morbidity.    The differential diagnosis includes: Medication management  Metabolic etiology   Co morbidities that complicate the patient evaluation: Diabetes Hypertension  Lab Tests: I ordered, and personally interpreted labs.   The pertinent results include:  Lipase 54 D-dimer 0.92 CMP-potassium-3.0 CMP-glucose-78 CBG-140 after treatment  Imaging Studies ordered: I ordered imaging studies including: Chest x-ray CTA angio PE ruleout I independently visualized and interpreted imaging which showed: No acute findings I agree with the radiologist interpretation  Cardiac Monitoring: The patient was maintained on a cardiac monitor.  I personally  viewed and interpreted the cardiac monitored which showed an underlying rhythm of: Normal sinus  Medicines ordered and prescription drug management: I ordered medications: Potassium chloride  supplements for hypokalemia I have reviewed the patients home medicines and have made adjustments as needed  Problem List / ED Course: Problem List: Hypoglycemia symptoms Emergency Department Course: The patient presented with a concern of hypoglycemia. Initial assessment included history, physical exam, and review of prior medical records.  On arrival, patient's blood glucose was 78.  She was given food in the emergency department with improvement of symptoms and repeat glucose increased to 140.  On physical exam, it was noted that patient had unilateral swelling of her left lower extremity-patient explained that she has noticed over the last couple weeks that her lower left leg has been notably larger than the right.  Laboratory studies showed mild hypokalemia, lipase elevated, and a positive D-dimer at 0.92.  Due to the lack of ultrasound availability and concern for DVT, CTA was performed to rule out pulmonary embolism and was unremarkable.  Chest x-ray was also unremarkable.  EKG showed normal sinus rhythm.  No evidence of systemic infection or acute pathology.  Given the mild hypoglycemia and recurrent medication dose change, symptoms were attributed to overcorrection with her diabetes medication.  She was treated with potassium supplement for hypokalemia and tolerated it well.  Due to persistent unilateral leg swelling, she was instructed to obtain a left lower extremity venous ultrasound tomorrow morning to formally evaluate for DVT.  Return precautions were discussed, including worsening swelling, pain, chest pain, shortness of breath, or recurrent hypoglycemia.  Reevaluation: After the interventions noted above, I reevaluated the patient and found that they have :improved  Dispostion: Patient improved  clinically, glucose normalized, and she was discharged in stable condition with close follow-up with PCP for further evaluation and care.  Patient was instructed not to restart the Mounjaro  now until she is able to speak with her PCP about the emergency department visit tonight.     Final  diagnoses:  Hypoglycemia  Left leg swelling    ED Discharge Orders          Ordered    Lower Ext Left Venous US        Comments: IMPORTANT PATIENT INSTRUCTIONS:  Your ED provider has recommended an Outpatient Ultrasound.  Please call [phone] to schedule an appointment.  If your appointment is scheduled for a Saturday, Sunday or holiday, please go to the [facility] Emergency Department Registration Desk at least 15 minutes prior to your appointment time and tell them you are there for an ultrasound.    If your appointment is scheduled for a weekday (Monday-Friday), please go directly to the [facility] Radiology Department at least 15 minutes prior to your appointment time and tell them you are there for an ultrasound.  Please call [phone] with questions.   04/07/24 2229               Willma Duwaine CROME, PA 04/17/24 1708    Simon Lavonia SAILOR, MD 04/19/24 [phone]

## 2024-04-07 NOTE — ED Triage Notes (Addendum)
 Pt with HA on and off , pt states HA when her blood sugar is low. Pt states her blood sugar was 74. Ate 2 hours ago.

## 2024-04-07 NOTE — Discharge Instructions (Signed)
 Thank you for visiting the Emergency Department today. It was a pleasure to be part of your healthcare team.  You were seen today for a hypoglycemic episode.  As discussed, you had laboratory results that indicate a possible DVT and are to follow-up with radiology tomorrow morning for an ultrasound.  If you have any questions about your medicines, please call your pharmacy or healthcare provider. At home, rest, hydrate, and resume normal diet. It is important to watch for warning signs such as worsening pain, fever, trouble breathing, shortness of breath. If any of these happen, return to the Emergency Department or call 911. Thank you for trusting us  with your health.

## 2024-04-08 ENCOUNTER — Ambulatory Visit (HOSPITAL_COMMUNITY)
Admission: RE | Admit: 2024-04-08 | Discharge: 2024-04-08 | Disposition: A | Discharge status: Home / Self Care | Source: Ambulatory Visit

## 2024-04-08 DIAGNOSIS — R7989 Other specified abnormal findings of blood chemistry: Secondary | ICD-10-CM | POA: Insufficient documentation

## 2024-04-08 DIAGNOSIS — R600 Localized edema: Secondary | ICD-10-CM | POA: Diagnosis not present

## 2024-04-08 LAB — CBG MONITORING, ED: Glucose-Capillary: 83 mg/dL (ref 70–99)

## 2024-04-10 DIAGNOSIS — H401131 Primary open-angle glaucoma, bilateral, mild stage: Secondary | ICD-10-CM | POA: Diagnosis not present

## 2024-04-12 DIAGNOSIS — I129 Hypertensive chronic kidney disease with stage 1 through stage 4 chronic kidney disease, or unspecified chronic kidney disease: Secondary | ICD-10-CM | POA: Diagnosis not present

## 2024-04-12 DIAGNOSIS — E876 Hypokalemia: Secondary | ICD-10-CM | POA: Diagnosis not present

## 2024-04-12 DIAGNOSIS — I5032 Chronic diastolic (congestive) heart failure: Secondary | ICD-10-CM | POA: Diagnosis not present

## 2024-04-12 DIAGNOSIS — E1122 Type 2 diabetes mellitus with diabetic chronic kidney disease: Secondary | ICD-10-CM | POA: Diagnosis not present

## 2024-04-22 ENCOUNTER — Other Ambulatory Visit: Payer: Self-pay | Admitting: "Endocrinology

## 2024-04-22 DIAGNOSIS — E1122 Type 2 diabetes mellitus with diabetic chronic kidney disease: Secondary | ICD-10-CM

## 2024-04-23 ENCOUNTER — Ambulatory Visit: Admitting: Podiatry

## 2024-04-24 ENCOUNTER — Encounter: Payer: Self-pay | Admitting: Podiatry

## 2024-04-24 ENCOUNTER — Ambulatory Visit

## 2024-04-24 ENCOUNTER — Ambulatory Visit: Admitting: Podiatry

## 2024-04-24 DIAGNOSIS — B351 Tinea unguium: Secondary | ICD-10-CM | POA: Diagnosis not present

## 2024-04-24 DIAGNOSIS — M79675 Pain in left toe(s): Secondary | ICD-10-CM | POA: Diagnosis not present

## 2024-04-24 DIAGNOSIS — Z794 Long term (current) use of insulin: Secondary | ICD-10-CM | POA: Diagnosis not present

## 2024-04-24 DIAGNOSIS — N181 Chronic kidney disease, stage 1: Secondary | ICD-10-CM | POA: Diagnosis not present

## 2024-04-24 DIAGNOSIS — E1122 Type 2 diabetes mellitus with diabetic chronic kidney disease: Secondary | ICD-10-CM | POA: Diagnosis not present

## 2024-04-24 DIAGNOSIS — M79674 Pain in right toe(s): Secondary | ICD-10-CM | POA: Diagnosis not present

## 2024-04-28 NOTE — Progress Notes (Addendum)
 1. Failure to attend appointment with reason given    Appointment rescheduled due to 2 hour delay secondary to inclement weather.

## 2024-05-02 NOTE — Progress Notes (Signed)
°  Subjective:  Patient ID: Joanna Brown, female    DOB: [birth year],  MRN: [id number]  AUDRI KOZUB presents to clinic today for at risk foot care. Pt has h/o NIDDM with chronic kidney disease and painful mycotic toenails x 10 which interfere with daily activities. Pain is relieved with periodic professional debridement.  Chief Complaint  Patient presents with   [facility]    Rm15 Diabetic foot care/ Dr. Rollene Pesa Last visit November 2025/ A1c     New problem(s): None.   PCP is Pesa Rollene BRAVO, MD.  Allergies[1]  Review of Systems: Negative except as noted in the HPI.  Objective: No changes noted in today's physical examination. There were no vitals filed for this visit. Joanna Brown is a pleasant 83 y.o. female WD, WN in NAD. AAO x 3.  Vascular Examination: Vascular status intact b/l with palpable pedal pulses. CFT immediate b/l. Pedal hair present. No edema. No pain with calf compression b/l. Skin temperature gradient WNL b/l. No varicosities noted. No cyanosis or clubbing noted.  Neurological Examination: Sensation grossly intact b/l with 10 gram monofilament.   Dermatological Examination: Pedal skin with normal turgor, texture and tone b/l. No open wounds nor interdigital macerations noted. Toenails 1-5 b/l thick, discolored, elongated with subungual debris and pain on dorsal palpation. No hyperkeratotic lesions noted b/l.   Musculoskeletal Examination: Muscle strength 5/5 to b/l LE.  No pain, crepitus noted b/l. HAV with bunion b/l. Patient ambulates independently without assistive aids.   Radiographs: None  Assessment/Plan: 1. Pain due to onychomycosis of toenails of both feet   2. Type 2 diabetes mellitus with stage 1 chronic kidney disease, with long-term current use of insulin  [facility])   Patient was evaluated and treated. All patient's and/or POA's questions/concerns addressed on today's visit. Toenails 1-5 b/l debrided in length and girth without incident.  Treatment was provided by assistant Mable FABIENE Cherry under my supervision. Continue foot and shoe inspections daily. Monitor blood glucose per PCP/Endocrinologist's recommendations. Continue soft, supportive shoe gear daily. Report any pedal injuries to medical professional. Call office if there are any questions/concerns.  Return in about 3 months (around 07/23/2024).  Delon LITTIE Merlin, DPM      [place] LOCATION: [address] [place] [address]                   Office [phone]   [facility] LOCATION: [address] [place], [place] [address] Office [phone]     [1]  Allergies Allergen Reactions   Inspra [Eplerenone] Itching   Senokot Wheat Bran [Wheat]     ABDOMINAL CRAMPS   Spironolactone      Stomach problems, vision changes

## 2024-05-27 ENCOUNTER — Ambulatory Visit: Admitting: "Endocrinology

## 2024-07-23 ENCOUNTER — Ambulatory Visit: Admitting: "Endocrinology

## 2024-08-06 ENCOUNTER — Ambulatory Visit: Admitting: Podiatry

## 2024-08-26 ENCOUNTER — Ambulatory Visit

## 2024-09-03 ENCOUNTER — Ambulatory Visit: Admitting: Family Medicine
